# Patient Record
Sex: Male | Born: 1945 | Race: White | Hispanic: No | Marital: Married | Smoking: Current every day smoker
Health system: Southern US, Community
[De-identification: ages and names within clinical notes are randomized; demographics above are authoritative.]

## PROBLEM LIST (undated history)

## (undated) DIAGNOSIS — E291 Testicular hypofunction: Secondary | ICD-10-CM

## (undated) DIAGNOSIS — E785 Hyperlipidemia, unspecified: Secondary | ICD-10-CM

## (undated) DIAGNOSIS — Z86010 Personal history of colonic polyps: Secondary | ICD-10-CM

## (undated) DIAGNOSIS — R202 Paresthesia of skin: Secondary | ICD-10-CM

## (undated) DIAGNOSIS — K219 Gastro-esophageal reflux disease without esophagitis: Secondary | ICD-10-CM

## (undated) DIAGNOSIS — L219 Seborrheic dermatitis, unspecified: Secondary | ICD-10-CM

## (undated) DIAGNOSIS — M5416 Radiculopathy, lumbar region: Secondary | ICD-10-CM

## (undated) DIAGNOSIS — E119 Type 2 diabetes mellitus without complications: Secondary | ICD-10-CM

## (undated) DIAGNOSIS — E669 Obesity, unspecified: Secondary | ICD-10-CM

## (undated) DIAGNOSIS — L570 Actinic keratosis: Secondary | ICD-10-CM

## (undated) DIAGNOSIS — F329 Major depressive disorder, single episode, unspecified: Secondary | ICD-10-CM

## (undated) DIAGNOSIS — M545 Low back pain: Secondary | ICD-10-CM

## (undated) DIAGNOSIS — M1710 Unilateral primary osteoarthritis, unspecified knee: Secondary | ICD-10-CM

## (undated) DIAGNOSIS — G629 Polyneuropathy, unspecified: Secondary | ICD-10-CM

## (undated) DIAGNOSIS — M549 Dorsalgia, unspecified: Secondary | ICD-10-CM

## (undated) DIAGNOSIS — N402 Nodular prostate without lower urinary tract symptoms: Secondary | ICD-10-CM

## (undated) DIAGNOSIS — G253 Myoclonus: Secondary | ICD-10-CM

## (undated) DIAGNOSIS — I10 Essential (primary) hypertension: Secondary | ICD-10-CM

## (undated) HISTORY — DX: Type 2 diabetes mellitus without complications: E11.9

## (undated) HISTORY — DX: Low back pain: M54.5

## (undated) HISTORY — DX: Hyperlipidemia, unspecified: E78.5

## (undated) HISTORY — DX: Personal history of colonic polyps: Z86.010

## (undated) HISTORY — DX: Obesity, unspecified: E66.9

## (undated) HISTORY — DX: Testicular hypofunction: E29.1

## (undated) HISTORY — PX: HAND SURGERY: SHX662

## (undated) HISTORY — DX: Nodular prostate without lower urinary tract symptoms: N40.2

## (undated) HISTORY — DX: Seborrheic dermatitis, unspecified: L21.9

## (undated) HISTORY — PX: ORIF ANKLE FRACTURE: SHX5408

## (undated) HISTORY — DX: Paresthesia of skin: R20.2

## (undated) HISTORY — PX: REPLACEMENT TOTAL KNEE: SUR1224

## (undated) HISTORY — DX: Polyneuropathy, unspecified: G62.9

## (undated) HISTORY — DX: Radiculopathy, lumbar region: M54.16

## (undated) HISTORY — PX: SHOULDER SURGERY: SHX246

## (undated) HISTORY — DX: Actinic keratosis: L57.0

## (undated) HISTORY — DX: Gastro-esophageal reflux disease without esophagitis: K21.9

## (undated) HISTORY — DX: Major depressive disorder, single episode, unspecified: F32.9

## (undated) HISTORY — DX: Unilateral primary osteoarthritis, unspecified knee: M17.10

## (undated) HISTORY — DX: Myoclonus: G25.3

---

## 2007-08-15 HISTORY — PX: ORIF ANKLE FRACTURE: SHX5408

## 2012-07-12 HISTORY — PX: REPLACEMENT TOTAL KNEE: SUR1224

## 2013-06-23 HISTORY — PX: UMBILICAL HERNIA REPAIR: SHX196

## 2013-11-22 DIAGNOSIS — B351 Tinea unguium: Secondary | ICD-10-CM | POA: Diagnosis not present

## 2013-11-22 DIAGNOSIS — M79609 Pain in unspecified limb: Secondary | ICD-10-CM | POA: Diagnosis not present

## 2014-09-17 DIAGNOSIS — I83893 Varicose veins of bilateral lower extremities with other complications: Secondary | ICD-10-CM | POA: Diagnosis not present

## 2014-09-25 DIAGNOSIS — I83893 Varicose veins of bilateral lower extremities with other complications: Secondary | ICD-10-CM | POA: Diagnosis not present

## 2014-11-23 DIAGNOSIS — M79672 Pain in left foot: Secondary | ICD-10-CM | POA: Diagnosis not present

## 2014-11-23 DIAGNOSIS — M79671 Pain in right foot: Secondary | ICD-10-CM | POA: Diagnosis not present

## 2014-11-23 DIAGNOSIS — B351 Tinea unguium: Secondary | ICD-10-CM | POA: Diagnosis not present

## 2014-11-23 DIAGNOSIS — E119 Type 2 diabetes mellitus without complications: Secondary | ICD-10-CM | POA: Diagnosis not present

## 2014-12-19 ENCOUNTER — Emergency Department (HOSPITAL_BASED_OUTPATIENT_CLINIC_OR_DEPARTMENT_OTHER)
Admission: EM | Admit: 2014-12-19 | Discharge: 2014-12-19 | Disposition: A | Discharge status: Home / Self Care | Payer: Medicare Other | Attending: Emergency Medicine | Admitting: Emergency Medicine

## 2014-12-19 ENCOUNTER — Emergency Department (HOSPITAL_BASED_OUTPATIENT_CLINIC_OR_DEPARTMENT_OTHER): Payer: Medicare Other

## 2014-12-19 ENCOUNTER — Encounter (HOSPITAL_BASED_OUTPATIENT_CLINIC_OR_DEPARTMENT_OTHER): Payer: Self-pay | Admitting: *Deleted

## 2014-12-19 DIAGNOSIS — Z720 Tobacco use: Secondary | ICD-10-CM | POA: Diagnosis not present

## 2014-12-19 DIAGNOSIS — Z79899 Other long term (current) drug therapy: Secondary | ICD-10-CM | POA: Insufficient documentation

## 2014-12-19 DIAGNOSIS — Y9389 Activity, other specified: Secondary | ICD-10-CM | POA: Insufficient documentation

## 2014-12-19 DIAGNOSIS — Y9289 Other specified places as the place of occurrence of the external cause: Secondary | ICD-10-CM | POA: Diagnosis not present

## 2014-12-19 DIAGNOSIS — Y998 Other external cause status: Secondary | ICD-10-CM | POA: Diagnosis not present

## 2014-12-19 DIAGNOSIS — Z23 Encounter for immunization: Secondary | ICD-10-CM | POA: Insufficient documentation

## 2014-12-19 DIAGNOSIS — S31121A Laceration of abdominal wall with foreign body, left upper quadrant without penetration into peritoneal cavity, initial encounter: Secondary | ICD-10-CM | POA: Insufficient documentation

## 2014-12-19 DIAGNOSIS — I10 Essential (primary) hypertension: Secondary | ICD-10-CM | POA: Insufficient documentation

## 2014-12-19 DIAGNOSIS — S31119A Laceration without foreign body of abdominal wall, unspecified quadrant without penetration into peritoneal cavity, initial encounter: Secondary | ICD-10-CM

## 2014-12-19 DIAGNOSIS — S3991XA Unspecified injury of abdomen, initial encounter: Secondary | ICD-10-CM | POA: Diagnosis present

## 2014-12-19 DIAGNOSIS — Y288XXA Contact with other sharp object, undetermined intent, initial encounter: Secondary | ICD-10-CM | POA: Insufficient documentation

## 2014-12-19 DIAGNOSIS — T182XXA Foreign body in stomach, initial encounter: Secondary | ICD-10-CM | POA: Diagnosis not present

## 2014-12-19 DIAGNOSIS — M795 Residual foreign body in soft tissue: Secondary | ICD-10-CM

## 2014-12-19 DIAGNOSIS — S36893A Laceration of other intra-abdominal organs, initial encounter: Secondary | ICD-10-CM | POA: Diagnosis not present

## 2014-12-19 HISTORY — DX: Essential (primary) hypertension: I10

## 2014-12-19 LAB — CBC WITH DIFFERENTIAL/PLATELET
BASOS ABS: 0.1 10*3/uL (ref 0.0–0.1)
BASOS PCT: 1 % (ref 0–1)
EOS ABS: 0.2 10*3/uL (ref 0.0–0.7)
EOS PCT: 2 % (ref 0–5)
HCT: 40.8 % (ref 39.0–52.0)
Hemoglobin: 13.4 g/dL (ref 13.0–17.0)
Lymphocytes Relative: 17 % (ref 12–46)
Lymphs Abs: 1.6 10*3/uL (ref 0.7–4.0)
MCH: 29.3 pg (ref 26.0–34.0)
MCHC: 32.8 g/dL (ref 30.0–36.0)
MCV: 89.1 fL (ref 78.0–100.0)
Monocytes Absolute: 0.7 10*3/uL (ref 0.1–1.0)
Monocytes Relative: 7 % (ref 3–12)
NEUTROS PCT: 73 % (ref 43–77)
Neutro Abs: 7.1 10*3/uL (ref 1.7–7.7)
Platelets: 273 10*3/uL (ref 150–400)
RBC: 4.58 MIL/uL (ref 4.22–5.81)
RDW: 14.2 % (ref 11.5–15.5)
WBC: 9.8 10*3/uL (ref 4.0–10.5)

## 2014-12-19 LAB — BASIC METABOLIC PANEL
Anion gap: 8 (ref 5–15)
BUN: 16 mg/dL (ref 6–23)
CO2: 30 mmol/L (ref 19–32)
Calcium: 9 mg/dL (ref 8.4–10.5)
Chloride: 105 mmol/L (ref 96–112)
Creatinine, Ser: 1.07 mg/dL (ref 0.50–1.35)
GFR calc non Af Amer: 68 mL/min — ABNORMAL LOW (ref >90)
GFR, EST AFRICAN AMERICAN: 79 mL/min — AB (ref >90)
GLUCOSE: 159 mg/dL — AB (ref 70–99)
Potassium: 4 mmol/L (ref 3.5–5.1)
SODIUM: 143 mmol/L (ref 135–145)

## 2014-12-19 MED ORDER — HYDROMORPHONE HCL 4 MG PO TABS: 4.0000 mg | ORAL_TABLET | ORAL | Status: DC | PRN

## 2014-12-19 MED ORDER — IOHEXOL 300 MG/ML  SOLN
100.0000 mL | Freq: Once | INTRAMUSCULAR | Status: AC | PRN
  Administered 2014-12-19: 100 mL via INTRAVENOUS

## 2014-12-19 MED ORDER — AMOXICILLIN-POT CLAVULANATE 875-125 MG PO TABS
1.0000 | ORAL_TABLET | Freq: Two times a day (BID) | ORAL | Status: DC
Start: 2014-12-19 — End: 2017-05-14

## 2014-12-19 MED ORDER — LIDOCAINE-EPINEPHRINE 2 %-1:100000 IJ SOLN
INTRAMUSCULAR | Status: AC
  Administered 2014-12-19: 1 mL
  Filled 2014-12-19: qty 1

## 2014-12-19 MED ORDER — TETANUS-DIPHTH-ACELL PERTUSSIS 5-2.5-18.5 LF-MCG/0.5 IM SUSP
0.5000 mL | Freq: Once | INTRAMUSCULAR | Status: AC
  Administered 2014-12-19: 0.5 mL via INTRAMUSCULAR
  Filled 2014-12-19: qty 0.5

## 2014-12-19 MED ORDER — HYDROCODONE-ACETAMINOPHEN 5-325 MG PO TABS: 1.0000 | ORAL_TABLET | Freq: Four times a day (QID) | ORAL | Status: DC | PRN

## 2014-12-19 NOTE — Discharge Instructions (Signed)

## 2014-12-19 NOTE — ED Provider Notes (Signed)
Thomas [name] is a 69 y.o. male with presenting with laceration  LACERATION REPAIR Performed by: Arkin Imran Authorized by: [employer] Consent: Verbal consent obtained. Risks and benefits: risks, benefits and alternatives were discussed Consent given by: patient Patient identity confirmed: provided demographic data Prepped and Draped in normal sterile fashion Wound explored  Laceration Location: left abdomen proximal to umbilicus  Laceration Length: 5-6 cm  Foreign Bodies seen and removed.  Anesthesia: local infiltration  Local anesthetic: lidocaine 2% with epinephrine  Anesthetic total: 5 ml  Irrigation method: syringe Amount of cleaning: extensive  Skin closure: 3-0 Proline  Number of sutures: one  Technique: running suture  Patient tolerance: Patient tolerated the procedure well with no immediate complications.    Al Corpus, PA-C 12/19/14 2226

## 2014-12-19 NOTE — ED Notes (Signed)
Pt c/o laceration to abd from metal x 1 hr ago

## 2014-12-19 NOTE — ED Provider Notes (Addendum)
CSN: [id number]     Arrival date & time 12/19/14  1614 History  This chart was scribed for Thomas Patches, MD by Steva Colder, ED Scribe. The patient was seen in room MH04/MH04 at 6:06 PM.     Chief Complaint  Patient presents with  . Laceration      HPI Comments: Pt presents with laceration to upper abdomen. Pt was using a metal saw to cut a piece o metal which the disk used for cutting broke apart while he was using machine at a high RPM. A piece lodged in his abdomen which was several inches long and he removed it PTA. His shirt was cut. Bleeding controlled PTA. HE is upset there has been a wait and just wants it washed out and closed.   The history is provided by the patient. No language interpreter was used.    HPI Comments: Thomas [name] is a 69 y.o. male with a medical hx of HTN who presents to the Emergency Department complaining of laceration onset 4 PM PTA. Pt notes that a metal wheel broke while he was cutting metal tonight. Pt was cut on his left upper forearm and his abdomen. Pt pulled a piece of metal out of his abdomen. Pt notes that the wheel broke apart and it shot out of the machine and cut him. Pt was making a platform to go on the back of his camper when the incident occurred. He denies abdominal pain and any other symptoms. Pt does not remember when his last tetanus shot was and he has not had one in the last ten years. Pt had a hernia surgery on his abdomen in the past. Pt is not allergic to any medications.   PCP- [name], MD    Past Medical History  Diagnosis Date  . Hypertension    History reviewed. No pertinent past surgical history. History reviewed. No pertinent family history. History  Substance Use Topics  . Smoking status: Current Every Day Smoker -- 1.00 packs/day  . Smokeless tobacco: Not on file  . Alcohol Use: No    Review of Systems  Constitutional: Negative for fever, activity change, appetite change and fatigue.  HENT: Negative for  congestion, facial swelling, rhinorrhea and trouble swallowing.   Eyes: Negative for photophobia and pain.  Respiratory: Negative for cough, chest tightness and shortness of breath.   Cardiovascular: Negative for chest pain and leg swelling.  Gastrointestinal: Negative for nausea, vomiting, abdominal pain, diarrhea and constipation.  Endocrine: Negative for polydipsia and polyuria.  Genitourinary: Negative for dysuria, urgency, decreased urine volume and difficulty urinating.  Musculoskeletal: Negative for back pain and gait problem.  Skin: Positive for wound. Negative for color change and rash.  Allergic/Immunologic: Negative for immunocompromised state.  Neurological: Negative for dizziness, facial asymmetry, speech difficulty, weakness, numbness and headaches.  Psychiatric/Behavioral: Negative for confusion, decreased concentration and agitation.    A complete 10 system review of systems was obtained and all systems are negative except as noted in the HPI and PMH.    Allergies  Review of patient's allergies indicates no known allergies.  Home Medications   Prior to Admission medications   Medication Sig Start Date End Date Taking? Authorizing Provider  hydrochlorothiazide (MICROZIDE) 12.5 MG capsule Take 12.5 mg by mouth daily.   Yes Historical Provider, MD  methadone (DOLOPHINE) 10 MG tablet Take 10 mg by mouth every 8 (eight) hours.   Yes Historical Provider, MD   BP 148/66 mmHg  Pulse 84  Temp(Src) 99.8  F (37.7 C) (Oral)  Resp 18  Ht _0  (1.803 m)  Wt 245 lb (111.131 kg)  BMI 34.19 kg/m2  SpO2 95%  Physical Exam  Constitutional: He is oriented to person, place, and time. He appears well-developed and well-nourished. No distress.  HENT:  Head: Normocephalic and atraumatic.  Mouth/Throat: No oropharyngeal exudate.  Eyes: Pupils are equal, round, and reactive to light.  Neck: Normal range of motion. Neck supple.  Cardiovascular: Normal rate, regular rhythm and  normal heart sounds.  Exam reveals no gallop and no friction rub.   No murmur heard. Pulmonary/Chest: Effort normal and breath sounds normal. No respiratory distress. He has no wheezes. He has no rales.  Abdominal: Soft. Bowel sounds are normal. He exhibits no distension and no mass. There is no tenderness. There is no rebound and no guarding.    Musculoskeletal: Normal range of motion. He exhibits no edema or tenderness.  Neurological: He is alert and oriented to person, place, and time.  Skin: Skin is warm and dry. Laceration noted.  4 cm linear laceration which when probed does not appear to go deeper than subcutaneous. Pt otherwise has no abdominal pain.   Psychiatric: He has a normal mood and affect.  Nursing note and vitals reviewed.   ED Course  Procedures (including critical care time) DIAGNOSTIC STUDIES: Oxygen Saturation is 95% on RA, adequate by my interpretation.    COORDINATION OF CARE: 6:11 PM-Discussed treatment plan which includes abdomen acute with chest X-ray, laceration repair, tetanus injection, f/u with PCP with pt at bedside and pt agreed to plan.     Results for orders placed or performed during the hospital encounter of 12/19/14  CBC with Differential  Result Value Ref Range   WBC 9.8 4.0 - 10.5 K/uL   RBC 4.58 4.22 - 5.81 MIL/uL   Hemoglobin 13.4 13.0 - 17.0 g/dL   HCT 40.8 39.0 - 52.0 %   MCV 89.1 78.0 - 100.0 fL   MCH 29.3 26.0 - 34.0 pg   MCHC 32.8 30.0 - 36.0 g/dL   RDW 14.2 11.5 - 15.5 %   Platelets 273 150 - 400 K/uL   Neutrophils Relative % 73 43 - 77 %   Neutro Abs 7.1 1.7 - 7.7 K/uL   Lymphocytes Relative 17 12 - 46 %   Lymphs Abs 1.6 0.7 - 4.0 K/uL   Monocytes Relative 7 3 - 12 %   Monocytes Absolute 0.7 0.1 - 1.0 K/uL   Eosinophils Relative 2 0 - 5 %   Eosinophils Absolute 0.2 0.0 - 0.7 K/uL   Basophils Relative 1 0 - 1 %   Basophils Absolute 0.1 0.0 - 0.1 K/uL  Basic metabolic panel  Result Value Ref Range   Sodium 143 135 - 145  mmol/L   Potassium 4.0 3.5 - 5.1 mmol/L   Chloride 105 96 - 112 mmol/L   CO2 30 19 - 32 mmol/L   Glucose, Bld 159 (H) 70 - 99 mg/dL   BUN 16 6 - 23 mg/dL   Creatinine, Ser 1.07 0.50 - 1.35 mg/dL   Calcium 9.0 8.4 - 10.5 mg/dL   GFR calc non Af Amer 68 (L) >90 mL/min   GFR calc Af Amer 79 (L) >90 mL/min   Anion gap 8 5 - 15   Ct Abdomen Pelvis W Contrast  12/19/2014   CLINICAL DATA:  Laceration to upper abdomen.  EXAM: CT ABDOMEN AND PELVIS WITH CONTRAST  TECHNIQUE: Multidetector CT imaging of the abdomen  and pelvis was performed using the standard protocol following bolus administration of intravenous contrast.  CONTRAST:  14m OMNIPAQUE IOHEXOL 300 MG/ML  SOLN  COMPARISON:  12/19/2014  FINDINGS: Lower chest: Mild cardiac enlargement. There is a small pericardial effusion identified. No pleural effusion noted. The lung bases are clear.  Hepatobiliary: No suspicious liver abnormalities identified. The gallbladder appears normal. There is no biliary dilatation.  Pancreas: Negative  Spleen: Negative  Adrenals/Urinary Tract: The adrenal glands are negative. Normal appearance of both kidneys. The urinary bladder is unremarkable.  Stomach/Bowel: The stomach is within normal limits. The small bowel loops have a normal course and caliber. No obstruction. Normal appearance of the colon. The stomach scratch th the appendix is visualized and appears normal.  Vascular/Lymphatic: Calcified atherosclerotic disease involves the abdominal aorta. No aneurysm. No enlarged retroperitoneal or mesenteric adenopathy. No enlarged pelvic or inguinal lymph nodes.  Reproductive: Prostate gland and seminal vesicles are unremarkable.  Other: Skin laceration is identified overlying the left upper quadrant of the abdomen, image 22/series 2. Subcutaneous emphysema noted. There is a small metallic foreign body on the surface of the scanned overlying the left upper quadrant, image 22/series 2. Previous mesh repair of periumbilical  hernia.  Musculoskeletal: Review of the visualized osseous structures is significant for multi level degenerative disc disease within the lumbar spine.  IMPRESSION: 1. No acute findings within the abdomen or pelvis. 2. Pericardial effusion. 3. Skin laceration overlying the left upper quadrant of the abdomen. There is an adjacent small metallic foreign body overlying the skin. 4. 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   Electronically Signed   By: TKerby MoorsM.D.   On: 12/19/2014 21:11   Dg Abd Acute W/chest  12/19/2014   CLINICAL DATA:  Trauma to upper abdomen with piece of metal removed from left upper abdomen earlier today  EXAM: DG ABDOMEN ACUTE W/ 1V CHEST  COMPARISON:  None.  FINDINGS: PA chest: No edema or consolidation. Heart is slightly enlarged with pulmonary vascularity within normal limits. No adenopathy. No pneumothorax. No radiopaque foreign body.  Supine and upright abdomen: There is moderate stool in the colon. There is no bowel dilatation or air-fluid level. No obstruction or free air. There is a tiny metallic foreign body in the left mid abdomen. Note that there are multiple coils in the pelvis.  IMPRESSION: Tiny metallic foreign body lateral left mid abdomen region. Postoperative change in the pelvis. Bowel gas pattern unremarkable. No lung edema or consolidation. Heart mildly enlarged.   Electronically Signed   By: WLowella GripIII M.D.   On: 12/19/2014 18:50        EKG Interpretation None      MDM   Final diagnoses:  Foreign body (FB) in soft tissue    Pt is a 69y.o. male with Pmhx as above who presents with laceration to upper abdomen. Pt was using a metal saw to cut a piece o metal which the disk used for cutting broke apart while he was using machine at a high RPM. A piece lodged in his abdomen which was several inches long and he removed it PTA. His shirt was cut. Bleeding controlled PTA. Laceration is linear, does not appear deep, but will  get XR ot r/o FB. Pt upset with the wait, I have apologized multiple times, but feel it is in his best interest to have wound fully evaluated before closure.   XR with retained FB. CT ordered, which shows no deep injury. Wound irrigated and  repaired by PA Daniel Nones, wound was reportedly dirty, will place on prophylactic abx. Tdap updated.    [facility] Hay evaluation in the Emergency Department is complete. It has been determined that no acute conditions requiring further emergency intervention are present at this time. The patient/guardian have been advised of the diagnosis and plan. We have discussed signs and symptoms that warrant return to the ED, such as changes or worsening in symptoms, worsening pain, fever, redness. Drainage.     I personally performed the services described in this documentation, which was scribed in my presence. The recorded information has been reviewed and is accurate.     Thomas Patches, MD 12/25/14 [id number]  Thomas Patches, MD 12/25/14 2134

## 2014-12-19 NOTE — ED Notes (Signed)
IV removed cath intact.  Dressings applied to wound and lac

## 2014-12-21 ENCOUNTER — Encounter (HOSPITAL_BASED_OUTPATIENT_CLINIC_OR_DEPARTMENT_OTHER): Payer: Self-pay

## 2014-12-21 ENCOUNTER — Emergency Department (HOSPITAL_BASED_OUTPATIENT_CLINIC_OR_DEPARTMENT_OTHER)
Admission: EM | Admit: 2014-12-21 | Discharge: 2014-12-21 | Disposition: A | Discharge status: Home / Self Care | Payer: Medicare Other | Attending: Emergency Medicine | Admitting: Emergency Medicine

## 2014-12-21 DIAGNOSIS — Z4801 Encounter for change or removal of surgical wound dressing: Secondary | ICD-10-CM | POA: Insufficient documentation

## 2014-12-21 DIAGNOSIS — Z720 Tobacco use: Secondary | ICD-10-CM | POA: Insufficient documentation

## 2014-12-21 DIAGNOSIS — Z792 Long term (current) use of antibiotics: Secondary | ICD-10-CM | POA: Diagnosis not present

## 2014-12-21 DIAGNOSIS — Z5189 Encounter for other specified aftercare: Secondary | ICD-10-CM

## 2014-12-21 DIAGNOSIS — I10 Essential (primary) hypertension: Secondary | ICD-10-CM | POA: Diagnosis not present

## 2014-12-21 DIAGNOSIS — Z79899 Other long term (current) drug therapy: Secondary | ICD-10-CM | POA: Diagnosis not present

## 2014-12-21 NOTE — ED Notes (Signed)
Pt reports he is here for follow up on his stomach on his stomach.

## 2014-12-21 NOTE — Discharge Instructions (Signed)
Suture removal on Friday or Saturday April 23/24th. Wound Care Wound care helps prevent pain and infection.  You may need a tetanus shot if:  You cannot remember when you had your last tetanus shot.  You have never had a tetanus shot.  The injury broke your skin. If you need a tetanus shot and you choose not to have one, you may get tetanus. Sickness from tetanus can be serious. HOME CARE   Only take medicine as told by your doctor.  Clean the wound daily with mild soap and water.  Change any bandages (dressings) as told by your doctor.  Put medicated cream and a bandage on the wound as told by your doctor.  Change the bandage if it gets wet, dirty, or starts to smell.  Take showers. Do not take baths, swim, or do anything that puts your wound under water.  Rest and raise (elevate) the wound until the pain and puffiness (swelling) are better.  Keep all doctor visits as told. GET HELP RIGHT AWAY IF:   Yellowish-white fluid (pus) comes from the wound.  Medicine does not lessen your pain.  There is a red streak going away from the wound.  You have a fever. MAKE SURE YOU:   Understand these instructions.  Will watch your condition.  Will get help right away if you are not doing well or get worse. Document Released: 06/02/2008 Document Revised: 11/16/2011 Document Reviewed: 12/28/2010 [facility] Patient Information 2015 [place], [place]. This information is not intended to replace advice given to you by your health care provider. Make sure you discuss any questions you have with your health care provider.

## 2014-12-21 NOTE — ED Provider Notes (Signed)
CSN: [id number]     Arrival date & time 12/21/14  0630 History   First MD Initiated Contact with Patient 12/21/14 [phone]     Chief Complaint  Patient presents with  . Wound Check      HPI  Concern evaluation for wound check. Seen here 2 days ago. He was using a cutoff wheel six-inch angle grinder when the blade ruptured. Laceration on his abdomen. His CT scan showed no deep penetration. A few shrapnel fragments removed as repaired. He presents here for routine recheck. Minimal bleeding on the first day, none since. No pain. No additional drainage.  Past Medical History  Diagnosis Date  . Hypertension    History reviewed. No pertinent past surgical history. No family history on file. History  Substance Use Topics  . Smoking status: Current Every Day Smoker -- 1.00 packs/day  . Smokeless tobacco: Not on file  . Alcohol Use: No    Review of Systems  Skin:       Amount is to the wound. No pain no bleeding no drainage minimal surrounding erythema.      Allergies  Review of patient's allergies indicates no known allergies.  Home Medications   Prior to Admission medications   Medication Sig Start Date End Date Taking? Authorizing Provider  amoxicillin-clavulanate (AUGMENTIN) 875-125 MG per tablet Take 1 tablet by mouth 2 (two) times daily. One po bid x 7 days 12/19/14   Ernestina Patches, MD  hydrochlorothiazide (MICROZIDE) 12.5 MG capsule Take 12.5 mg by mouth daily.    Historical Provider, MD  HYDROcodone-acetaminophen (NORCO) 5-325 MG per tablet Take 1 tablet by mouth every 6 (six) hours as needed. 12/19/14   Ernestina Patches, MD  methadone (DOLOPHINE) 10 MG tablet Take 10 mg by mouth every 8 (eight) hours.    Historical Provider, MD   BP 162/91 mmHg  Pulse 88  Temp(Src) 98.9 F (37.2 C) (Oral)  Resp 20  SpO2 95% Physical Exam  Abdominal:      ED Course  Procedures (including critical care time) Labs Review Labs Reviewed - No data to display  Imaging Review Ct  Abdomen Pelvis W Contrast  12/19/2014   CLINICAL DATA:  Laceration to upper abdomen.  EXAM: CT ABDOMEN AND PELVIS WITH CONTRAST  TECHNIQUE: Multidetector CT imaging of the abdomen and pelvis was performed using the standard protocol following bolus administration of intravenous contrast.  CONTRAST:  132m OMNIPAQUE IOHEXOL 300 MG/ML  SOLN  COMPARISON:  12/19/2014  FINDINGS: Lower chest: Mild cardiac enlargement. There is a small pericardial effusion identified. No pleural effusion noted. The lung bases are clear.  Hepatobiliary: No suspicious liver abnormalities identified. The gallbladder appears normal. There is no biliary dilatation.  Pancreas: Negative  Spleen: Negative  Adrenals/Urinary Tract: The adrenal glands are negative. Normal appearance of both kidneys. The urinary bladder is unremarkable.  Stomach/Bowel: The stomach is within normal limits. The small bowel loops have a normal course and caliber. No obstruction. Normal appearance of the colon. The stomach scratch th the appendix is visualized and appears normal.  Vascular/Lymphatic: Calcified atherosclerotic disease involves the abdominal aorta. No aneurysm. No enlarged retroperitoneal or mesenteric adenopathy. No enlarged pelvic or inguinal lymph nodes.  Reproductive: Prostate gland and seminal vesicles are unremarkable.  Other: Skin laceration is identified overlying the left upper quadrant of the abdomen, image 22/series 2. Subcutaneous emphysema noted. There is a small metallic foreign body on the surface of the scanned overlying the left upper quadrant, image 22/series 2. Previous mesh repair of  periumbilical hernia.  Musculoskeletal: Review of the visualized osseous structures is significant for multi level degenerative disc disease within the lumbar spine.  IMPRESSION: 1. No acute findings within the abdomen or pelvis. 2. Pericardial effusion. 3. Skin laceration overlying the left upper quadrant of the abdomen. There is an adjacent small metallic  foreign body overlying the skin. 4. 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   Electronically Signed   By: Kerby Moors M.D.   On: 12/19/2014 21:11   Dg Abd Acute W/chest  12/19/2014   CLINICAL DATA:  Trauma to upper abdomen with piece of metal removed from left upper abdomen earlier today  EXAM: DG ABDOMEN ACUTE W/ 1V CHEST  COMPARISON:  None.  FINDINGS: PA chest: No edema or consolidation. Heart is slightly enlarged with pulmonary vascularity within normal limits. No adenopathy. No pneumothorax. No radiopaque foreign body.  Supine and upright abdomen: There is moderate stool in the colon. There is no bowel dilatation or air-fluid level. No obstruction or free air. There is a tiny metallic foreign body in the left mid abdomen. Note that there are multiple coils in the pelvis.  IMPRESSION: Tiny metallic foreign body lateral left mid abdomen region. Postoperative change in the pelvis. Bowel gas pattern unremarkable. No lung edema or consolidation. Heart mildly enlarged.   Electronically Signed   By: Lowella Grip III M.D.   On: 12/19/2014 18:50     EKG Interpretation None      MDM   Final diagnoses:  Visit for wound check    Continue basic wound care. Suture removal in an additional 7 day days.    Tanna Furry, MD 12/21/14 [phone]

## 2014-12-24 DIAGNOSIS — I83891 Varicose veins of right lower extremities with other complications: Secondary | ICD-10-CM | POA: Diagnosis not present

## 2014-12-24 DIAGNOSIS — I83811 Varicose veins of right lower extremities with pain: Secondary | ICD-10-CM | POA: Diagnosis not present

## 2014-12-30 ENCOUNTER — Emergency Department (HOSPITAL_BASED_OUTPATIENT_CLINIC_OR_DEPARTMENT_OTHER)
Admission: EM | Admit: 2014-12-30 | Discharge: 2014-12-30 | Disposition: A | Discharge status: Home / Self Care | Payer: Medicare Other | Attending: Emergency Medicine | Admitting: Emergency Medicine

## 2014-12-30 ENCOUNTER — Encounter (HOSPITAL_BASED_OUTPATIENT_CLINIC_OR_DEPARTMENT_OTHER): Payer: Self-pay | Admitting: *Deleted

## 2014-12-30 DIAGNOSIS — Z720 Tobacco use: Secondary | ICD-10-CM | POA: Insufficient documentation

## 2014-12-30 DIAGNOSIS — Z4802 Encounter for removal of sutures: Secondary | ICD-10-CM | POA: Insufficient documentation

## 2014-12-30 DIAGNOSIS — I10 Essential (primary) hypertension: Secondary | ICD-10-CM | POA: Insufficient documentation

## 2014-12-30 DIAGNOSIS — Z79899 Other long term (current) drug therapy: Secondary | ICD-10-CM | POA: Insufficient documentation

## 2014-12-30 NOTE — Discharge Instructions (Signed)
Suture Removal, Care After Refer to this sheet in the next few weeks. These instructions provide you with information on caring for yourself after your procedure. Your health care provider may also give you more specific instructions. Your treatment has been planned according to current medical practices, but problems sometimes occur. Call your health care provider if you have any problems or questions after your procedure. WHAT TO EXPECT AFTER THE PROCEDURE After your stitches (sutures) are removed, it is typical to have the following:  Some discomfort and swelling in the wound area.  Slight redness in the area. HOME CARE INSTRUCTIONS   If you have skin adhesive strips over the wound area, do not take the strips off. They will fall off on their own in a few days. If the strips remain in place after 14 days, you may remove them.  Change any bandages (dressings) at least once a day or as directed by your health care provider. If the bandage sticks, soak it off with warm, soapy water.  Apply cream or ointment only as directed by your health care provider. If using cream or ointment, wash the area with soap and water 2 times a day to remove all the cream or ointment. Rinse off the soap and pat the area dry with a clean towel.  Keep the wound area dry and clean. If the bandage becomes wet or dirty, or if it develops a bad smell, change it as soon as possible.  Continue to protect the wound from injury.  Use sunscreen when out in the sun. New scars become sunburned easily. SEEK MEDICAL CARE IF:  You have increasing redness, swelling, or pain in the wound.  You see pus coming from the wound.  You have a fever.  You notice a bad smell coming from the wound or dressing.  Your wound breaks open (edges not staying together). Document Released: 05/19/2001 Document Revised: 06/14/2013 Document Reviewed: 04/05/2013 [facility] Patient Information 2015 [place], [place]. This information is not  intended to replace advice given to you by your health care provider. Make sure you discuss any questions you have with your health care provider.

## 2014-12-30 NOTE — ED Notes (Signed)
Here for suture removal on abd, site is red in color, pt denies drainage or fever, states has been applying Neosporin to site daily

## 2014-12-30 NOTE — ED Notes (Signed)
Pt teaching done as regards to hand hygiene, S&S of infection. Pt stated back instructions.

## 2014-12-30 NOTE — ED Notes (Signed)
Here for suture removal , EDP in to see pt, assess site, sutures removal by EDP, bandaid applied per orders

## 2014-12-30 NOTE — ED Notes (Signed)
dsg applied, per orders

## 2014-12-30 NOTE — ED Provider Notes (Signed)
CSN: [id number]     Arrival date & time 12/30/14  2500 History   None    Chief Complaint  Patient presents with  . Suture / Staple Removal     (Consider location/radiation/quality/duration/timing/severity/associated sxs/prior Treatment) HPI Comments: Presents to the ER for removal of sutures. Patient denies any pain, drainage.  Patient is a 69 y.o. male presenting with suture removal.  Suture / Staple Removal    Past Medical History  Diagnosis Date  . Hypertension    History reviewed. No pertinent past surgical history. History reviewed. No pertinent family history. History  Substance Use Topics  . Smoking status: Current Every Day Smoker -- 1.00 packs/day  . Smokeless tobacco: Not on file  . Alcohol Use: No    Review of Systems  Skin: Negative.       Allergies  Review of patient's allergies indicates no known allergies.  Home Medications   Prior to Admission medications   Medication Sig Start Date End Date Taking? Authorizing Provider  amoxicillin-clavulanate (AUGMENTIN) 875-125 MG per tablet Take 1 tablet by mouth 2 (two) times daily. One po bid x 7 days 12/19/14   Ernestina Patches, MD  hydrochlorothiazide (MICROZIDE) 12.5 MG capsule Take 12.5 mg by mouth daily.    Historical Provider, MD  HYDROcodone-acetaminophen (NORCO) 5-325 MG per tablet Take 1 tablet by mouth every 6 (six) hours as needed. 12/19/14   Ernestina Patches, MD  methadone (DOLOPHINE) 10 MG tablet Take 10 mg by mouth every 8 (eight) hours.    Historical Provider, MD   BP 163/89 mmHg  Pulse 90  Temp(Src) 97.8 F (36.6 C) (Oral)  Resp 18  Wt 250 lb (113.399 kg)  SpO2 99% Physical Exam  Skin:  Running suture in place left side of mid abdomen. Erythema noted at wound margins, well demarcated edges, likely secondary to Neosporin use. No induration, warmth, fluctuance.    ED Course  Procedures (including critical care time) Labs Review Labs Reviewed - No data to display  Imaging Review No results  found.   EKG Interpretation None      MDM   Final diagnoses:  Encounter for removal of sutures    Well-demarcated erythema surrounding the entirety of the wound that appears to be the outline of bandage and/or Neosporin application. This does not appear to be infection. Patient had sutures removed by myself. One running suture removed in its entirety.    Orpah Greek, MD 12/30/14 [phone]

## 2015-05-22 DIAGNOSIS — M79671 Pain in right foot: Secondary | ICD-10-CM | POA: Diagnosis not present

## 2015-05-22 DIAGNOSIS — B351 Tinea unguium: Secondary | ICD-10-CM | POA: Diagnosis not present

## 2015-05-22 DIAGNOSIS — E119 Type 2 diabetes mellitus without complications: Secondary | ICD-10-CM | POA: Diagnosis not present

## 2015-05-22 DIAGNOSIS — M79672 Pain in left foot: Secondary | ICD-10-CM | POA: Diagnosis not present

## 2015-06-04 DIAGNOSIS — M9905 Segmental and somatic dysfunction of pelvic region: Secondary | ICD-10-CM | POA: Diagnosis not present

## 2015-06-04 DIAGNOSIS — M9904 Segmental and somatic dysfunction of sacral region: Secondary | ICD-10-CM | POA: Diagnosis not present

## 2015-06-04 DIAGNOSIS — G544 Lumbosacral root disorders, not elsewhere classified: Secondary | ICD-10-CM | POA: Diagnosis not present

## 2015-06-04 DIAGNOSIS — M9903 Segmental and somatic dysfunction of lumbar region: Secondary | ICD-10-CM | POA: Diagnosis not present

## 2015-06-05 DIAGNOSIS — M9904 Segmental and somatic dysfunction of sacral region: Secondary | ICD-10-CM | POA: Diagnosis not present

## 2015-06-05 DIAGNOSIS — M9905 Segmental and somatic dysfunction of pelvic region: Secondary | ICD-10-CM | POA: Diagnosis not present

## 2015-06-05 DIAGNOSIS — G544 Lumbosacral root disorders, not elsewhere classified: Secondary | ICD-10-CM | POA: Diagnosis not present

## 2015-06-05 DIAGNOSIS — M9903 Segmental and somatic dysfunction of lumbar region: Secondary | ICD-10-CM | POA: Diagnosis not present

## 2015-06-06 DIAGNOSIS — G544 Lumbosacral root disorders, not elsewhere classified: Secondary | ICD-10-CM | POA: Diagnosis not present

## 2015-06-06 DIAGNOSIS — M9904 Segmental and somatic dysfunction of sacral region: Secondary | ICD-10-CM | POA: Diagnosis not present

## 2015-06-06 DIAGNOSIS — M9905 Segmental and somatic dysfunction of pelvic region: Secondary | ICD-10-CM | POA: Diagnosis not present

## 2015-06-06 DIAGNOSIS — M9903 Segmental and somatic dysfunction of lumbar region: Secondary | ICD-10-CM | POA: Diagnosis not present

## 2015-06-07 DIAGNOSIS — G544 Lumbosacral root disorders, not elsewhere classified: Secondary | ICD-10-CM | POA: Diagnosis not present

## 2015-06-07 DIAGNOSIS — M9905 Segmental and somatic dysfunction of pelvic region: Secondary | ICD-10-CM | POA: Diagnosis not present

## 2015-06-07 DIAGNOSIS — M9904 Segmental and somatic dysfunction of sacral region: Secondary | ICD-10-CM | POA: Diagnosis not present

## 2015-06-07 DIAGNOSIS — M9903 Segmental and somatic dysfunction of lumbar region: Secondary | ICD-10-CM | POA: Diagnosis not present

## 2015-06-10 DIAGNOSIS — G544 Lumbosacral root disorders, not elsewhere classified: Secondary | ICD-10-CM | POA: Diagnosis not present

## 2015-06-10 DIAGNOSIS — M1712 Unilateral primary osteoarthritis, left knee: Secondary | ICD-10-CM | POA: Diagnosis not present

## 2015-06-10 DIAGNOSIS — M9903 Segmental and somatic dysfunction of lumbar region: Secondary | ICD-10-CM | POA: Diagnosis not present

## 2015-06-10 DIAGNOSIS — M9904 Segmental and somatic dysfunction of sacral region: Secondary | ICD-10-CM | POA: Diagnosis not present

## 2015-06-10 DIAGNOSIS — M25562 Pain in left knee: Secondary | ICD-10-CM | POA: Diagnosis not present

## 2015-06-10 DIAGNOSIS — R262 Difficulty in walking, not elsewhere classified: Secondary | ICD-10-CM | POA: Diagnosis not present

## 2015-06-10 DIAGNOSIS — M9905 Segmental and somatic dysfunction of pelvic region: Secondary | ICD-10-CM | POA: Diagnosis not present

## 2015-06-11 DIAGNOSIS — M9905 Segmental and somatic dysfunction of pelvic region: Secondary | ICD-10-CM | POA: Diagnosis not present

## 2015-06-11 DIAGNOSIS — M9903 Segmental and somatic dysfunction of lumbar region: Secondary | ICD-10-CM | POA: Diagnosis not present

## 2015-06-11 DIAGNOSIS — G544 Lumbosacral root disorders, not elsewhere classified: Secondary | ICD-10-CM | POA: Diagnosis not present

## 2015-06-11 DIAGNOSIS — M9904 Segmental and somatic dysfunction of sacral region: Secondary | ICD-10-CM | POA: Diagnosis not present

## 2015-06-12 DIAGNOSIS — M9902 Segmental and somatic dysfunction of thoracic region: Secondary | ICD-10-CM | POA: Diagnosis not present

## 2015-06-12 DIAGNOSIS — G542 Cervical root disorders, not elsewhere classified: Secondary | ICD-10-CM | POA: Diagnosis not present

## 2015-06-12 DIAGNOSIS — M9901 Segmental and somatic dysfunction of cervical region: Secondary | ICD-10-CM | POA: Diagnosis not present

## 2015-06-12 DIAGNOSIS — M9903 Segmental and somatic dysfunction of lumbar region: Secondary | ICD-10-CM | POA: Diagnosis not present

## 2015-06-13 DIAGNOSIS — M9904 Segmental and somatic dysfunction of sacral region: Secondary | ICD-10-CM | POA: Diagnosis not present

## 2015-06-13 DIAGNOSIS — G544 Lumbosacral root disorders, not elsewhere classified: Secondary | ICD-10-CM | POA: Diagnosis not present

## 2015-06-13 DIAGNOSIS — M9905 Segmental and somatic dysfunction of pelvic region: Secondary | ICD-10-CM | POA: Diagnosis not present

## 2015-06-13 DIAGNOSIS — M9903 Segmental and somatic dysfunction of lumbar region: Secondary | ICD-10-CM | POA: Diagnosis not present

## 2015-06-17 DIAGNOSIS — M9905 Segmental and somatic dysfunction of pelvic region: Secondary | ICD-10-CM | POA: Diagnosis not present

## 2015-06-17 DIAGNOSIS — M9904 Segmental and somatic dysfunction of sacral region: Secondary | ICD-10-CM | POA: Diagnosis not present

## 2015-06-17 DIAGNOSIS — G544 Lumbosacral root disorders, not elsewhere classified: Secondary | ICD-10-CM | POA: Diagnosis not present

## 2015-06-17 DIAGNOSIS — M9903 Segmental and somatic dysfunction of lumbar region: Secondary | ICD-10-CM | POA: Diagnosis not present

## 2015-06-19 DIAGNOSIS — M9904 Segmental and somatic dysfunction of sacral region: Secondary | ICD-10-CM | POA: Diagnosis not present

## 2015-06-19 DIAGNOSIS — G544 Lumbosacral root disorders, not elsewhere classified: Secondary | ICD-10-CM | POA: Diagnosis not present

## 2015-06-19 DIAGNOSIS — M9905 Segmental and somatic dysfunction of pelvic region: Secondary | ICD-10-CM | POA: Diagnosis not present

## 2015-06-19 DIAGNOSIS — M9903 Segmental and somatic dysfunction of lumbar region: Secondary | ICD-10-CM | POA: Diagnosis not present

## 2015-06-24 DIAGNOSIS — M9904 Segmental and somatic dysfunction of sacral region: Secondary | ICD-10-CM | POA: Diagnosis not present

## 2015-06-24 DIAGNOSIS — M9905 Segmental and somatic dysfunction of pelvic region: Secondary | ICD-10-CM | POA: Diagnosis not present

## 2015-06-24 DIAGNOSIS — M9903 Segmental and somatic dysfunction of lumbar region: Secondary | ICD-10-CM | POA: Diagnosis not present

## 2015-06-24 DIAGNOSIS — G544 Lumbosacral root disorders, not elsewhere classified: Secondary | ICD-10-CM | POA: Diagnosis not present

## 2015-06-25 DIAGNOSIS — M25561 Pain in right knee: Secondary | ICD-10-CM | POA: Diagnosis not present

## 2015-06-25 DIAGNOSIS — M25562 Pain in left knee: Secondary | ICD-10-CM | POA: Diagnosis not present

## 2015-06-25 DIAGNOSIS — G544 Lumbosacral root disorders, not elsewhere classified: Secondary | ICD-10-CM | POA: Diagnosis not present

## 2015-06-25 DIAGNOSIS — M9904 Segmental and somatic dysfunction of sacral region: Secondary | ICD-10-CM | POA: Diagnosis not present

## 2015-06-25 DIAGNOSIS — R269 Unspecified abnormalities of gait and mobility: Secondary | ICD-10-CM | POA: Diagnosis not present

## 2015-06-25 DIAGNOSIS — M170 Bilateral primary osteoarthritis of knee: Secondary | ICD-10-CM | POA: Diagnosis not present

## 2015-06-25 DIAGNOSIS — M9905 Segmental and somatic dysfunction of pelvic region: Secondary | ICD-10-CM | POA: Diagnosis not present

## 2015-06-25 DIAGNOSIS — M1712 Unilateral primary osteoarthritis, left knee: Secondary | ICD-10-CM | POA: Diagnosis not present

## 2015-06-25 DIAGNOSIS — M9903 Segmental and somatic dysfunction of lumbar region: Secondary | ICD-10-CM | POA: Diagnosis not present

## 2015-06-26 DIAGNOSIS — M9904 Segmental and somatic dysfunction of sacral region: Secondary | ICD-10-CM | POA: Diagnosis not present

## 2015-06-26 DIAGNOSIS — G544 Lumbosacral root disorders, not elsewhere classified: Secondary | ICD-10-CM | POA: Diagnosis not present

## 2015-06-26 DIAGNOSIS — M9905 Segmental and somatic dysfunction of pelvic region: Secondary | ICD-10-CM | POA: Diagnosis not present

## 2015-06-26 DIAGNOSIS — M9903 Segmental and somatic dysfunction of lumbar region: Secondary | ICD-10-CM | POA: Diagnosis not present

## 2015-06-27 DIAGNOSIS — M9905 Segmental and somatic dysfunction of pelvic region: Secondary | ICD-10-CM | POA: Diagnosis not present

## 2015-06-27 DIAGNOSIS — M9904 Segmental and somatic dysfunction of sacral region: Secondary | ICD-10-CM | POA: Diagnosis not present

## 2015-06-27 DIAGNOSIS — G544 Lumbosacral root disorders, not elsewhere classified: Secondary | ICD-10-CM | POA: Diagnosis not present

## 2015-06-27 DIAGNOSIS — M9903 Segmental and somatic dysfunction of lumbar region: Secondary | ICD-10-CM | POA: Diagnosis not present

## 2015-06-28 DIAGNOSIS — M9903 Segmental and somatic dysfunction of lumbar region: Secondary | ICD-10-CM | POA: Diagnosis not present

## 2015-06-28 DIAGNOSIS — M9902 Segmental and somatic dysfunction of thoracic region: Secondary | ICD-10-CM | POA: Diagnosis not present

## 2015-06-28 DIAGNOSIS — G542 Cervical root disorders, not elsewhere classified: Secondary | ICD-10-CM | POA: Diagnosis not present

## 2015-06-28 DIAGNOSIS — M9901 Segmental and somatic dysfunction of cervical region: Secondary | ICD-10-CM | POA: Diagnosis not present

## 2015-06-29 ENCOUNTER — Emergency Department (HOSPITAL_BASED_OUTPATIENT_CLINIC_OR_DEPARTMENT_OTHER): Payer: Medicare Other

## 2015-06-29 ENCOUNTER — Encounter (HOSPITAL_BASED_OUTPATIENT_CLINIC_OR_DEPARTMENT_OTHER): Payer: Self-pay | Admitting: *Deleted

## 2015-06-29 ENCOUNTER — Emergency Department (HOSPITAL_BASED_OUTPATIENT_CLINIC_OR_DEPARTMENT_OTHER)
Admission: EM | Admit: 2015-06-29 | Discharge: 2015-06-29 | Disposition: A | Discharge status: Home / Self Care | Payer: Medicare Other | Attending: Emergency Medicine | Admitting: Emergency Medicine

## 2015-06-29 DIAGNOSIS — S4991XA Unspecified injury of right shoulder and upper arm, initial encounter: Secondary | ICD-10-CM | POA: Diagnosis not present

## 2015-06-29 DIAGNOSIS — M25511 Pain in right shoulder: Secondary | ICD-10-CM | POA: Diagnosis not present

## 2015-06-29 DIAGNOSIS — W010XXA Fall on same level from slipping, tripping and stumbling without subsequent striking against object, initial encounter: Secondary | ICD-10-CM | POA: Insufficient documentation

## 2015-06-29 DIAGNOSIS — Y998 Other external cause status: Secondary | ICD-10-CM | POA: Diagnosis not present

## 2015-06-29 DIAGNOSIS — S8991XA Unspecified injury of right lower leg, initial encounter: Secondary | ICD-10-CM | POA: Insufficient documentation

## 2015-06-29 DIAGNOSIS — Z720 Tobacco use: Secondary | ICD-10-CM | POA: Insufficient documentation

## 2015-06-29 DIAGNOSIS — I10 Essential (primary) hypertension: Secondary | ICD-10-CM | POA: Insufficient documentation

## 2015-06-29 DIAGNOSIS — Z79899 Other long term (current) drug therapy: Secondary | ICD-10-CM | POA: Insufficient documentation

## 2015-06-29 DIAGNOSIS — S40911A Unspecified superficial injury of right shoulder, initial encounter: Secondary | ICD-10-CM | POA: Diagnosis not present

## 2015-06-29 DIAGNOSIS — Y9289 Other specified places as the place of occurrence of the external cause: Secondary | ICD-10-CM | POA: Insufficient documentation

## 2015-06-29 DIAGNOSIS — Y9389 Activity, other specified: Secondary | ICD-10-CM | POA: Insufficient documentation

## 2015-06-29 DIAGNOSIS — S299XXA Unspecified injury of thorax, initial encounter: Secondary | ICD-10-CM | POA: Diagnosis not present

## 2015-06-29 DIAGNOSIS — M25561 Pain in right knee: Secondary | ICD-10-CM | POA: Diagnosis not present

## 2015-06-29 HISTORY — DX: Dorsalgia, unspecified: M54.9

## 2015-06-29 MED ORDER — IBUPROFEN 200 MG PO TABS: 400.0000 mg | ORAL_TABLET | Freq: Three times a day (TID) | ORAL | Status: AC

## 2015-06-29 NOTE — ED Notes (Signed)
Patient states he tripped and fell and injured right shoulder Thursday, no LOC.  He waited and thought it would feel better, but it continues to hurt and ROM is limited

## 2015-06-29 NOTE — ED Provider Notes (Signed)
CSN: [id number]     Arrival date & time 06/29/15  1478 History   First MD Initiated Contact with Patient 06/29/15 [phone]     Chief Complaint  Patient presents with  . Fall     (Consider location/radiation/quality/duration/timing/severity/associated sxs/prior Treatment) Patient is a 69 y.o. male presenting with arm injury.  Arm Injury Location:  Shoulder Time since incident:  2 days Injury: yes   Mechanism of injury: fall   Shoulder location:  R shoulder Pain details:    Quality:  Aching   Radiates to:  L shoulder   Severity:  Moderate   Onset quality:  Sudden   Duration:  2 days   Timing:  Constant   Progression:  Unchanged Relieved by:  Nothing Worsened by:  Movement Ineffective treatments: 33m oxycodone. Associated symptoms: no numbness, no stiffness, no swelling and no tingling     Past Medical History  Diagnosis Date  . Hypertension   . Back pain    History reviewed. No pertinent past surgical history. No family history on file. Social History  Substance Use Topics  . Smoking status: Current Every Day Smoker -- 1.00 packs/day  . Smokeless tobacco: None  . Alcohol Use: No    Review of Systems  Musculoskeletal: Negative for stiffness.  All other systems reviewed and are negative.     Allergies  Review of patient's allergies indicates no known allergies.  Home Medications   Prior to Admission medications   Medication Sig Start Date End Date Taking? Authorizing Provider  oxyCODONE-acetaminophen (PERCOCET/ROXICET) 5-325 MG tablet Take 1 tablet by mouth every 4 (four) hours as needed for severe pain.   Yes Historical Provider, MD  amoxicillin-clavulanate (AUGMENTIN) 875-125 MG per tablet Take 1 tablet by mouth 2 (two) times daily. One po bid x 7 days 12/19/14   MErnestina Patches MD  hydrochlorothiazide (MICROZIDE) 12.5 MG capsule Take 12.5 mg by mouth daily.    Historical Provider, MD  methadone (DOLOPHINE) 10 MG tablet Take 5 mg by mouth every 8 (eight) hours.      Historical Provider, MD   BP 150/79 mmHg  Pulse 108  Temp(Src) 98.1 F (36.7 C) (Oral)  Resp 20  Ht _0  (1.803 m)  Wt 240 lb (108.863 kg)  BMI 33.49 kg/m2  SpO2 98% Physical Exam  Constitutional: He is oriented to person, place, and time. He appears well-developed and well-nourished. No distress.  HENT:  Head: Normocephalic and atraumatic.  Eyes: Conjunctivae are normal. No scleral icterus.  Neck: Neck supple.  Cardiovascular: Normal rate and intact distal pulses.   Pulmonary/Chest: Effort normal. No stridor. No respiratory distress. He has no wheezes.      Abdominal: Normal appearance. He exhibits no distension.  Musculoskeletal:       Right shoulder: He exhibits tenderness. He exhibits no bony tenderness.       Right knee: Tenderness found.  Neurological: He is alert and oriented to person, place, and time.  Skin: Skin is warm and dry. No rash noted.  Psychiatric: He has a normal mood and affect. His behavior is normal.  Nursing note and vitals reviewed.   ED Course  Procedures (including critical care time) Labs Review Labs Reviewed - No data to display  Imaging Review Dg Chest 2 View  06/29/2015  CLINICAL DATA:  Fall EXAM: CHEST  2 VIEW COMPARISON:  12/19/2014 FINDINGS: Normal heart size. Subsegmental atelectasis at the left base. Right lung clear. No pneumothorax. No pleural effusion. IMPRESSION: Left basilar atelectasis. Electronically Signed  By: Marybelle Killings M.D.   On: 06/29/2015 11:06   Dg Shoulder Right  06/29/2015  CLINICAL DATA:  Right shoulder pain.  Fall 2 days ago. EXAM: RIGHT SHOULDER - 2+ VIEW COMPARISON:  None. FINDINGS: No acute fracture. No dislocation. Mild degenerative change of the [facility] joint. IMPRESSION: No acute bony pathology. Electronically Signed   By: Marybelle Killings M.D.   On: 06/29/2015 11:05   Dg Knee Complete 4 Views Right  06/29/2015  CLINICAL DATA:  Fall EXAM: RIGHT KNEE - COMPLETE 4+ VIEW COMPARISON:  None. FINDINGS: Total knee  arthroplasty in place. Anatomic alignment of the osseous and prostatic structures. No breakage or loosening of the hardware. No fracture. IMPRESSION: No acute bony pathology.  Postoperative change. Electronically Signed   By: Marybelle Killings M.D.   On: 06/29/2015 11:08   I have personally reviewed and evaluated these images and lab results as part of my medical decision-making.   EKG Interpretation None      MDM   Final diagnoses:  Right shoulder injury, initial encounter  Right knee pain    69 yo male who fell a few days ago with persistent right shoulder pain since.  He did not hit his head, lose consciousness.  Plain films were negative.  Remained well appearing.  Ambulatory.  dc'd home.    Serita Grit, MD 06/30/15 [phone]

## 2015-06-29 NOTE — ED Notes (Signed)
Patient states he fell Thursday and injured left arm.

## 2015-07-01 DIAGNOSIS — H04123 Dry eye syndrome of bilateral lacrimal glands: Secondary | ICD-10-CM | POA: Diagnosis not present

## 2015-07-01 DIAGNOSIS — G544 Lumbosacral root disorders, not elsewhere classified: Secondary | ICD-10-CM | POA: Diagnosis not present

## 2015-07-01 DIAGNOSIS — M9904 Segmental and somatic dysfunction of sacral region: Secondary | ICD-10-CM | POA: Diagnosis not present

## 2015-07-01 DIAGNOSIS — H02834 Dermatochalasis of left upper eyelid: Secondary | ICD-10-CM | POA: Diagnosis not present

## 2015-07-01 DIAGNOSIS — H02831 Dermatochalasis of right upper eyelid: Secondary | ICD-10-CM | POA: Diagnosis not present

## 2015-07-01 DIAGNOSIS — M9903 Segmental and somatic dysfunction of lumbar region: Secondary | ICD-10-CM | POA: Diagnosis not present

## 2015-07-01 DIAGNOSIS — H43813 Vitreous degeneration, bilateral: Secondary | ICD-10-CM | POA: Diagnosis not present

## 2015-07-01 DIAGNOSIS — M9905 Segmental and somatic dysfunction of pelvic region: Secondary | ICD-10-CM | POA: Diagnosis not present

## 2015-07-02 DIAGNOSIS — R269 Unspecified abnormalities of gait and mobility: Secondary | ICD-10-CM | POA: Diagnosis not present

## 2015-07-02 DIAGNOSIS — G542 Cervical root disorders, not elsewhere classified: Secondary | ICD-10-CM | POA: Diagnosis not present

## 2015-07-02 DIAGNOSIS — M9903 Segmental and somatic dysfunction of lumbar region: Secondary | ICD-10-CM | POA: Diagnosis not present

## 2015-07-02 DIAGNOSIS — M9901 Segmental and somatic dysfunction of cervical region: Secondary | ICD-10-CM | POA: Diagnosis not present

## 2015-07-02 DIAGNOSIS — M1712 Unilateral primary osteoarthritis, left knee: Secondary | ICD-10-CM | POA: Diagnosis not present

## 2015-07-02 DIAGNOSIS — M25562 Pain in left knee: Secondary | ICD-10-CM | POA: Diagnosis not present

## 2015-07-02 DIAGNOSIS — M9902 Segmental and somatic dysfunction of thoracic region: Secondary | ICD-10-CM | POA: Diagnosis not present

## 2015-07-03 DIAGNOSIS — M9903 Segmental and somatic dysfunction of lumbar region: Secondary | ICD-10-CM | POA: Diagnosis not present

## 2015-07-03 DIAGNOSIS — G544 Lumbosacral root disorders, not elsewhere classified: Secondary | ICD-10-CM | POA: Diagnosis not present

## 2015-07-03 DIAGNOSIS — M9905 Segmental and somatic dysfunction of pelvic region: Secondary | ICD-10-CM | POA: Diagnosis not present

## 2015-07-03 DIAGNOSIS — M9904 Segmental and somatic dysfunction of sacral region: Secondary | ICD-10-CM | POA: Diagnosis not present

## 2015-07-04 DIAGNOSIS — G544 Lumbosacral root disorders, not elsewhere classified: Secondary | ICD-10-CM | POA: Diagnosis not present

## 2015-07-04 DIAGNOSIS — M9905 Segmental and somatic dysfunction of pelvic region: Secondary | ICD-10-CM | POA: Diagnosis not present

## 2015-07-04 DIAGNOSIS — M9904 Segmental and somatic dysfunction of sacral region: Secondary | ICD-10-CM | POA: Diagnosis not present

## 2015-07-04 DIAGNOSIS — M9903 Segmental and somatic dysfunction of lumbar region: Secondary | ICD-10-CM | POA: Diagnosis not present

## 2015-07-08 DIAGNOSIS — M9901 Segmental and somatic dysfunction of cervical region: Secondary | ICD-10-CM | POA: Diagnosis not present

## 2015-07-08 DIAGNOSIS — G542 Cervical root disorders, not elsewhere classified: Secondary | ICD-10-CM | POA: Diagnosis not present

## 2015-07-08 DIAGNOSIS — M9903 Segmental and somatic dysfunction of lumbar region: Secondary | ICD-10-CM | POA: Diagnosis not present

## 2015-07-08 DIAGNOSIS — M9902 Segmental and somatic dysfunction of thoracic region: Secondary | ICD-10-CM | POA: Diagnosis not present

## 2015-07-09 DIAGNOSIS — M9904 Segmental and somatic dysfunction of sacral region: Secondary | ICD-10-CM | POA: Diagnosis not present

## 2015-07-09 DIAGNOSIS — M25562 Pain in left knee: Secondary | ICD-10-CM | POA: Diagnosis not present

## 2015-07-09 DIAGNOSIS — G544 Lumbosacral root disorders, not elsewhere classified: Secondary | ICD-10-CM | POA: Diagnosis not present

## 2015-07-09 DIAGNOSIS — M9905 Segmental and somatic dysfunction of pelvic region: Secondary | ICD-10-CM | POA: Diagnosis not present

## 2015-07-09 DIAGNOSIS — M9903 Segmental and somatic dysfunction of lumbar region: Secondary | ICD-10-CM | POA: Diagnosis not present

## 2015-07-09 DIAGNOSIS — M1712 Unilateral primary osteoarthritis, left knee: Secondary | ICD-10-CM | POA: Diagnosis not present

## 2015-07-10 DIAGNOSIS — M9903 Segmental and somatic dysfunction of lumbar region: Secondary | ICD-10-CM | POA: Diagnosis not present

## 2015-07-10 DIAGNOSIS — G544 Lumbosacral root disorders, not elsewhere classified: Secondary | ICD-10-CM | POA: Diagnosis not present

## 2015-07-10 DIAGNOSIS — M9904 Segmental and somatic dysfunction of sacral region: Secondary | ICD-10-CM | POA: Diagnosis not present

## 2015-07-10 DIAGNOSIS — M9905 Segmental and somatic dysfunction of pelvic region: Secondary | ICD-10-CM | POA: Diagnosis not present

## 2015-07-15 DIAGNOSIS — M9903 Segmental and somatic dysfunction of lumbar region: Secondary | ICD-10-CM | POA: Diagnosis not present

## 2015-07-15 DIAGNOSIS — G544 Lumbosacral root disorders, not elsewhere classified: Secondary | ICD-10-CM | POA: Diagnosis not present

## 2015-07-15 DIAGNOSIS — M9905 Segmental and somatic dysfunction of pelvic region: Secondary | ICD-10-CM | POA: Diagnosis not present

## 2015-07-15 DIAGNOSIS — M9904 Segmental and somatic dysfunction of sacral region: Secondary | ICD-10-CM | POA: Diagnosis not present

## 2015-07-16 DIAGNOSIS — M9903 Segmental and somatic dysfunction of lumbar region: Secondary | ICD-10-CM | POA: Diagnosis not present

## 2015-07-16 DIAGNOSIS — M25561 Pain in right knee: Secondary | ICD-10-CM | POA: Diagnosis not present

## 2015-07-16 DIAGNOSIS — M1712 Unilateral primary osteoarthritis, left knee: Secondary | ICD-10-CM | POA: Diagnosis not present

## 2015-07-16 DIAGNOSIS — M9904 Segmental and somatic dysfunction of sacral region: Secondary | ICD-10-CM | POA: Diagnosis not present

## 2015-07-16 DIAGNOSIS — M170 Bilateral primary osteoarthritis of knee: Secondary | ICD-10-CM | POA: Diagnosis not present

## 2015-07-16 DIAGNOSIS — R2689 Other abnormalities of gait and mobility: Secondary | ICD-10-CM | POA: Diagnosis not present

## 2015-07-16 DIAGNOSIS — M25562 Pain in left knee: Secondary | ICD-10-CM | POA: Diagnosis not present

## 2015-07-16 DIAGNOSIS — G544 Lumbosacral root disorders, not elsewhere classified: Secondary | ICD-10-CM | POA: Diagnosis not present

## 2015-07-16 DIAGNOSIS — M9905 Segmental and somatic dysfunction of pelvic region: Secondary | ICD-10-CM | POA: Diagnosis not present

## 2015-07-17 DIAGNOSIS — M25562 Pain in left knee: Secondary | ICD-10-CM | POA: Diagnosis not present

## 2015-07-17 DIAGNOSIS — M9904 Segmental and somatic dysfunction of sacral region: Secondary | ICD-10-CM | POA: Diagnosis not present

## 2015-07-17 DIAGNOSIS — G544 Lumbosacral root disorders, not elsewhere classified: Secondary | ICD-10-CM | POA: Diagnosis not present

## 2015-07-17 DIAGNOSIS — M9905 Segmental and somatic dysfunction of pelvic region: Secondary | ICD-10-CM | POA: Diagnosis not present

## 2015-07-17 DIAGNOSIS — M9903 Segmental and somatic dysfunction of lumbar region: Secondary | ICD-10-CM | POA: Diagnosis not present

## 2015-07-18 DIAGNOSIS — M9905 Segmental and somatic dysfunction of pelvic region: Secondary | ICD-10-CM | POA: Diagnosis not present

## 2015-07-18 DIAGNOSIS — M9904 Segmental and somatic dysfunction of sacral region: Secondary | ICD-10-CM | POA: Diagnosis not present

## 2015-07-18 DIAGNOSIS — M9903 Segmental and somatic dysfunction of lumbar region: Secondary | ICD-10-CM | POA: Diagnosis not present

## 2015-07-18 DIAGNOSIS — G544 Lumbosacral root disorders, not elsewhere classified: Secondary | ICD-10-CM | POA: Diagnosis not present

## 2015-07-22 DIAGNOSIS — G544 Lumbosacral root disorders, not elsewhere classified: Secondary | ICD-10-CM | POA: Diagnosis not present

## 2015-07-22 DIAGNOSIS — M9904 Segmental and somatic dysfunction of sacral region: Secondary | ICD-10-CM | POA: Diagnosis not present

## 2015-07-22 DIAGNOSIS — M9903 Segmental and somatic dysfunction of lumbar region: Secondary | ICD-10-CM | POA: Diagnosis not present

## 2015-07-22 DIAGNOSIS — M9905 Segmental and somatic dysfunction of pelvic region: Secondary | ICD-10-CM | POA: Diagnosis not present

## 2015-07-23 DIAGNOSIS — M9905 Segmental and somatic dysfunction of pelvic region: Secondary | ICD-10-CM | POA: Diagnosis not present

## 2015-07-23 DIAGNOSIS — G544 Lumbosacral root disorders, not elsewhere classified: Secondary | ICD-10-CM | POA: Diagnosis not present

## 2015-07-23 DIAGNOSIS — M9904 Segmental and somatic dysfunction of sacral region: Secondary | ICD-10-CM | POA: Diagnosis not present

## 2015-07-23 DIAGNOSIS — M9903 Segmental and somatic dysfunction of lumbar region: Secondary | ICD-10-CM | POA: Diagnosis not present

## 2015-07-24 DIAGNOSIS — M9905 Segmental and somatic dysfunction of pelvic region: Secondary | ICD-10-CM | POA: Diagnosis not present

## 2015-07-24 DIAGNOSIS — G544 Lumbosacral root disorders, not elsewhere classified: Secondary | ICD-10-CM | POA: Diagnosis not present

## 2015-07-24 DIAGNOSIS — M9903 Segmental and somatic dysfunction of lumbar region: Secondary | ICD-10-CM | POA: Diagnosis not present

## 2015-07-24 DIAGNOSIS — M9904 Segmental and somatic dysfunction of sacral region: Secondary | ICD-10-CM | POA: Diagnosis not present

## 2015-07-25 DIAGNOSIS — M9903 Segmental and somatic dysfunction of lumbar region: Secondary | ICD-10-CM | POA: Diagnosis not present

## 2015-07-25 DIAGNOSIS — G544 Lumbosacral root disorders, not elsewhere classified: Secondary | ICD-10-CM | POA: Diagnosis not present

## 2015-07-25 DIAGNOSIS — M9904 Segmental and somatic dysfunction of sacral region: Secondary | ICD-10-CM | POA: Diagnosis not present

## 2015-07-25 DIAGNOSIS — M9905 Segmental and somatic dysfunction of pelvic region: Secondary | ICD-10-CM | POA: Diagnosis not present

## 2015-07-29 DIAGNOSIS — G544 Lumbosacral root disorders, not elsewhere classified: Secondary | ICD-10-CM | POA: Diagnosis not present

## 2015-07-29 DIAGNOSIS — M9903 Segmental and somatic dysfunction of lumbar region: Secondary | ICD-10-CM | POA: Diagnosis not present

## 2015-07-29 DIAGNOSIS — M9905 Segmental and somatic dysfunction of pelvic region: Secondary | ICD-10-CM | POA: Diagnosis not present

## 2015-07-29 DIAGNOSIS — M9904 Segmental and somatic dysfunction of sacral region: Secondary | ICD-10-CM | POA: Diagnosis not present

## 2015-07-30 DIAGNOSIS — M9903 Segmental and somatic dysfunction of lumbar region: Secondary | ICD-10-CM | POA: Diagnosis not present

## 2015-07-30 DIAGNOSIS — M9904 Segmental and somatic dysfunction of sacral region: Secondary | ICD-10-CM | POA: Diagnosis not present

## 2015-07-30 DIAGNOSIS — M9905 Segmental and somatic dysfunction of pelvic region: Secondary | ICD-10-CM | POA: Diagnosis not present

## 2015-07-30 DIAGNOSIS — G544 Lumbosacral root disorders, not elsewhere classified: Secondary | ICD-10-CM | POA: Diagnosis not present

## 2015-07-31 DIAGNOSIS — G542 Cervical root disorders, not elsewhere classified: Secondary | ICD-10-CM | POA: Diagnosis not present

## 2015-07-31 DIAGNOSIS — M9903 Segmental and somatic dysfunction of lumbar region: Secondary | ICD-10-CM | POA: Diagnosis not present

## 2015-07-31 DIAGNOSIS — M9901 Segmental and somatic dysfunction of cervical region: Secondary | ICD-10-CM | POA: Diagnosis not present

## 2015-07-31 DIAGNOSIS — M9902 Segmental and somatic dysfunction of thoracic region: Secondary | ICD-10-CM | POA: Diagnosis not present

## 2015-08-05 DIAGNOSIS — M9904 Segmental and somatic dysfunction of sacral region: Secondary | ICD-10-CM | POA: Diagnosis not present

## 2015-08-05 DIAGNOSIS — M9905 Segmental and somatic dysfunction of pelvic region: Secondary | ICD-10-CM | POA: Diagnosis not present

## 2015-08-05 DIAGNOSIS — G544 Lumbosacral root disorders, not elsewhere classified: Secondary | ICD-10-CM | POA: Diagnosis not present

## 2015-08-05 DIAGNOSIS — M9903 Segmental and somatic dysfunction of lumbar region: Secondary | ICD-10-CM | POA: Diagnosis not present

## 2015-08-06 DIAGNOSIS — M9904 Segmental and somatic dysfunction of sacral region: Secondary | ICD-10-CM | POA: Diagnosis not present

## 2015-08-06 DIAGNOSIS — M9905 Segmental and somatic dysfunction of pelvic region: Secondary | ICD-10-CM | POA: Diagnosis not present

## 2015-08-06 DIAGNOSIS — M9903 Segmental and somatic dysfunction of lumbar region: Secondary | ICD-10-CM | POA: Diagnosis not present

## 2015-08-06 DIAGNOSIS — G544 Lumbosacral root disorders, not elsewhere classified: Secondary | ICD-10-CM | POA: Diagnosis not present

## 2015-08-07 DIAGNOSIS — M9903 Segmental and somatic dysfunction of lumbar region: Secondary | ICD-10-CM | POA: Diagnosis not present

## 2015-08-07 DIAGNOSIS — M9902 Segmental and somatic dysfunction of thoracic region: Secondary | ICD-10-CM | POA: Diagnosis not present

## 2015-08-07 DIAGNOSIS — M9901 Segmental and somatic dysfunction of cervical region: Secondary | ICD-10-CM | POA: Diagnosis not present

## 2015-08-07 DIAGNOSIS — G542 Cervical root disorders, not elsewhere classified: Secondary | ICD-10-CM | POA: Diagnosis not present

## 2015-08-09 DIAGNOSIS — M9903 Segmental and somatic dysfunction of lumbar region: Secondary | ICD-10-CM | POA: Diagnosis not present

## 2015-08-09 DIAGNOSIS — M9902 Segmental and somatic dysfunction of thoracic region: Secondary | ICD-10-CM | POA: Diagnosis not present

## 2015-08-09 DIAGNOSIS — M9901 Segmental and somatic dysfunction of cervical region: Secondary | ICD-10-CM | POA: Diagnosis not present

## 2015-08-09 DIAGNOSIS — G542 Cervical root disorders, not elsewhere classified: Secondary | ICD-10-CM | POA: Diagnosis not present

## 2015-08-12 DIAGNOSIS — M9903 Segmental and somatic dysfunction of lumbar region: Secondary | ICD-10-CM | POA: Diagnosis not present

## 2015-08-12 DIAGNOSIS — G542 Cervical root disorders, not elsewhere classified: Secondary | ICD-10-CM | POA: Diagnosis not present

## 2015-08-12 DIAGNOSIS — M9901 Segmental and somatic dysfunction of cervical region: Secondary | ICD-10-CM | POA: Diagnosis not present

## 2015-08-12 DIAGNOSIS — M9902 Segmental and somatic dysfunction of thoracic region: Secondary | ICD-10-CM | POA: Diagnosis not present

## 2015-08-14 DIAGNOSIS — G542 Cervical root disorders, not elsewhere classified: Secondary | ICD-10-CM | POA: Diagnosis not present

## 2015-08-14 DIAGNOSIS — M9903 Segmental and somatic dysfunction of lumbar region: Secondary | ICD-10-CM | POA: Diagnosis not present

## 2015-08-14 DIAGNOSIS — M9902 Segmental and somatic dysfunction of thoracic region: Secondary | ICD-10-CM | POA: Diagnosis not present

## 2015-08-14 DIAGNOSIS — M9901 Segmental and somatic dysfunction of cervical region: Secondary | ICD-10-CM | POA: Diagnosis not present

## 2015-08-15 DIAGNOSIS — M9903 Segmental and somatic dysfunction of lumbar region: Secondary | ICD-10-CM | POA: Diagnosis not present

## 2015-08-15 DIAGNOSIS — M9901 Segmental and somatic dysfunction of cervical region: Secondary | ICD-10-CM | POA: Diagnosis not present

## 2015-08-15 DIAGNOSIS — G542 Cervical root disorders, not elsewhere classified: Secondary | ICD-10-CM | POA: Diagnosis not present

## 2015-08-15 DIAGNOSIS — M9902 Segmental and somatic dysfunction of thoracic region: Secondary | ICD-10-CM | POA: Diagnosis not present

## 2015-08-19 DIAGNOSIS — M75111 Incomplete rotator cuff tear or rupture of right shoulder, not specified as traumatic: Secondary | ICD-10-CM | POA: Diagnosis not present

## 2015-08-21 DIAGNOSIS — M79671 Pain in right foot: Secondary | ICD-10-CM | POA: Diagnosis not present

## 2015-08-21 DIAGNOSIS — M9903 Segmental and somatic dysfunction of lumbar region: Secondary | ICD-10-CM | POA: Diagnosis not present

## 2015-08-21 DIAGNOSIS — G542 Cervical root disorders, not elsewhere classified: Secondary | ICD-10-CM | POA: Diagnosis not present

## 2015-08-21 DIAGNOSIS — M79672 Pain in left foot: Secondary | ICD-10-CM | POA: Diagnosis not present

## 2015-08-21 DIAGNOSIS — M9901 Segmental and somatic dysfunction of cervical region: Secondary | ICD-10-CM | POA: Diagnosis not present

## 2015-08-21 DIAGNOSIS — M9902 Segmental and somatic dysfunction of thoracic region: Secondary | ICD-10-CM | POA: Diagnosis not present

## 2015-08-21 DIAGNOSIS — B351 Tinea unguium: Secondary | ICD-10-CM | POA: Diagnosis not present

## 2015-08-21 DIAGNOSIS — E119 Type 2 diabetes mellitus without complications: Secondary | ICD-10-CM | POA: Diagnosis not present

## 2015-08-22 DIAGNOSIS — M9905 Segmental and somatic dysfunction of pelvic region: Secondary | ICD-10-CM | POA: Diagnosis not present

## 2015-08-22 DIAGNOSIS — M9903 Segmental and somatic dysfunction of lumbar region: Secondary | ICD-10-CM | POA: Diagnosis not present

## 2015-08-22 DIAGNOSIS — G544 Lumbosacral root disorders, not elsewhere classified: Secondary | ICD-10-CM | POA: Diagnosis not present

## 2015-08-22 DIAGNOSIS — M9904 Segmental and somatic dysfunction of sacral region: Secondary | ICD-10-CM | POA: Diagnosis not present

## 2015-08-26 DIAGNOSIS — M9902 Segmental and somatic dysfunction of thoracic region: Secondary | ICD-10-CM | POA: Diagnosis not present

## 2015-08-26 DIAGNOSIS — M9901 Segmental and somatic dysfunction of cervical region: Secondary | ICD-10-CM | POA: Diagnosis not present

## 2015-08-26 DIAGNOSIS — M9903 Segmental and somatic dysfunction of lumbar region: Secondary | ICD-10-CM | POA: Diagnosis not present

## 2015-08-26 DIAGNOSIS — G542 Cervical root disorders, not elsewhere classified: Secondary | ICD-10-CM | POA: Diagnosis not present

## 2015-08-27 DIAGNOSIS — M9903 Segmental and somatic dysfunction of lumbar region: Secondary | ICD-10-CM | POA: Diagnosis not present

## 2015-08-27 DIAGNOSIS — M9904 Segmental and somatic dysfunction of sacral region: Secondary | ICD-10-CM | POA: Diagnosis not present

## 2015-08-27 DIAGNOSIS — G544 Lumbosacral root disorders, not elsewhere classified: Secondary | ICD-10-CM | POA: Diagnosis not present

## 2015-08-27 DIAGNOSIS — M9905 Segmental and somatic dysfunction of pelvic region: Secondary | ICD-10-CM | POA: Diagnosis not present

## 2015-09-03 DIAGNOSIS — M9904 Segmental and somatic dysfunction of sacral region: Secondary | ICD-10-CM | POA: Diagnosis not present

## 2015-09-03 DIAGNOSIS — M9903 Segmental and somatic dysfunction of lumbar region: Secondary | ICD-10-CM | POA: Diagnosis not present

## 2015-09-03 DIAGNOSIS — G544 Lumbosacral root disorders, not elsewhere classified: Secondary | ICD-10-CM | POA: Diagnosis not present

## 2015-09-03 DIAGNOSIS — M9905 Segmental and somatic dysfunction of pelvic region: Secondary | ICD-10-CM | POA: Diagnosis not present

## 2015-09-04 DIAGNOSIS — G544 Lumbosacral root disorders, not elsewhere classified: Secondary | ICD-10-CM | POA: Diagnosis not present

## 2015-09-04 DIAGNOSIS — M9904 Segmental and somatic dysfunction of sacral region: Secondary | ICD-10-CM | POA: Diagnosis not present

## 2015-09-04 DIAGNOSIS — M9903 Segmental and somatic dysfunction of lumbar region: Secondary | ICD-10-CM | POA: Diagnosis not present

## 2015-09-04 DIAGNOSIS — M9905 Segmental and somatic dysfunction of pelvic region: Secondary | ICD-10-CM | POA: Diagnosis not present

## 2015-09-05 DIAGNOSIS — M9903 Segmental and somatic dysfunction of lumbar region: Secondary | ICD-10-CM | POA: Diagnosis not present

## 2015-09-05 DIAGNOSIS — M9905 Segmental and somatic dysfunction of pelvic region: Secondary | ICD-10-CM | POA: Diagnosis not present

## 2015-09-05 DIAGNOSIS — G544 Lumbosacral root disorders, not elsewhere classified: Secondary | ICD-10-CM | POA: Diagnosis not present

## 2015-09-05 DIAGNOSIS — M9904 Segmental and somatic dysfunction of sacral region: Secondary | ICD-10-CM | POA: Diagnosis not present

## 2015-09-11 DIAGNOSIS — M9902 Segmental and somatic dysfunction of thoracic region: Secondary | ICD-10-CM | POA: Diagnosis not present

## 2015-09-11 DIAGNOSIS — G542 Cervical root disorders, not elsewhere classified: Secondary | ICD-10-CM | POA: Diagnosis not present

## 2015-09-11 DIAGNOSIS — M9903 Segmental and somatic dysfunction of lumbar region: Secondary | ICD-10-CM | POA: Diagnosis not present

## 2015-09-11 DIAGNOSIS — M9901 Segmental and somatic dysfunction of cervical region: Secondary | ICD-10-CM | POA: Diagnosis not present

## 2015-09-12 DIAGNOSIS — M9901 Segmental and somatic dysfunction of cervical region: Secondary | ICD-10-CM | POA: Diagnosis not present

## 2015-09-12 DIAGNOSIS — M9902 Segmental and somatic dysfunction of thoracic region: Secondary | ICD-10-CM | POA: Diagnosis not present

## 2015-09-12 DIAGNOSIS — G542 Cervical root disorders, not elsewhere classified: Secondary | ICD-10-CM | POA: Diagnosis not present

## 2015-09-12 DIAGNOSIS — M9903 Segmental and somatic dysfunction of lumbar region: Secondary | ICD-10-CM | POA: Diagnosis not present

## 2015-09-13 DIAGNOSIS — G542 Cervical root disorders, not elsewhere classified: Secondary | ICD-10-CM | POA: Diagnosis not present

## 2015-09-13 DIAGNOSIS — M9901 Segmental and somatic dysfunction of cervical region: Secondary | ICD-10-CM | POA: Diagnosis not present

## 2015-09-13 DIAGNOSIS — M9902 Segmental and somatic dysfunction of thoracic region: Secondary | ICD-10-CM | POA: Diagnosis not present

## 2015-09-13 DIAGNOSIS — M9903 Segmental and somatic dysfunction of lumbar region: Secondary | ICD-10-CM | POA: Diagnosis not present

## 2015-09-17 DIAGNOSIS — M75111 Incomplete rotator cuff tear or rupture of right shoulder, not specified as traumatic: Secondary | ICD-10-CM | POA: Diagnosis not present

## 2015-09-18 DIAGNOSIS — M9901 Segmental and somatic dysfunction of cervical region: Secondary | ICD-10-CM | POA: Diagnosis not present

## 2015-09-18 DIAGNOSIS — G542 Cervical root disorders, not elsewhere classified: Secondary | ICD-10-CM | POA: Diagnosis not present

## 2015-09-18 DIAGNOSIS — M9903 Segmental and somatic dysfunction of lumbar region: Secondary | ICD-10-CM | POA: Diagnosis not present

## 2015-09-18 DIAGNOSIS — M9902 Segmental and somatic dysfunction of thoracic region: Secondary | ICD-10-CM | POA: Diagnosis not present

## 2015-09-19 DIAGNOSIS — G542 Cervical root disorders, not elsewhere classified: Secondary | ICD-10-CM | POA: Diagnosis not present

## 2015-09-19 DIAGNOSIS — M9902 Segmental and somatic dysfunction of thoracic region: Secondary | ICD-10-CM | POA: Diagnosis not present

## 2015-09-19 DIAGNOSIS — M9901 Segmental and somatic dysfunction of cervical region: Secondary | ICD-10-CM | POA: Diagnosis not present

## 2015-09-19 DIAGNOSIS — M9903 Segmental and somatic dysfunction of lumbar region: Secondary | ICD-10-CM | POA: Diagnosis not present

## 2015-09-20 DIAGNOSIS — G544 Lumbosacral root disorders, not elsewhere classified: Secondary | ICD-10-CM | POA: Diagnosis not present

## 2015-09-20 DIAGNOSIS — M9904 Segmental and somatic dysfunction of sacral region: Secondary | ICD-10-CM | POA: Diagnosis not present

## 2015-09-20 DIAGNOSIS — M9905 Segmental and somatic dysfunction of pelvic region: Secondary | ICD-10-CM | POA: Diagnosis not present

## 2015-09-20 DIAGNOSIS — M9903 Segmental and somatic dysfunction of lumbar region: Secondary | ICD-10-CM | POA: Diagnosis not present

## 2015-09-24 DIAGNOSIS — M9905 Segmental and somatic dysfunction of pelvic region: Secondary | ICD-10-CM | POA: Diagnosis not present

## 2015-09-24 DIAGNOSIS — M9904 Segmental and somatic dysfunction of sacral region: Secondary | ICD-10-CM | POA: Diagnosis not present

## 2015-09-24 DIAGNOSIS — M9903 Segmental and somatic dysfunction of lumbar region: Secondary | ICD-10-CM | POA: Diagnosis not present

## 2015-09-24 DIAGNOSIS — G544 Lumbosacral root disorders, not elsewhere classified: Secondary | ICD-10-CM | POA: Diagnosis not present

## 2015-09-25 DIAGNOSIS — M9903 Segmental and somatic dysfunction of lumbar region: Secondary | ICD-10-CM | POA: Diagnosis not present

## 2015-09-25 DIAGNOSIS — G544 Lumbosacral root disorders, not elsewhere classified: Secondary | ICD-10-CM | POA: Diagnosis not present

## 2015-09-25 DIAGNOSIS — M9905 Segmental and somatic dysfunction of pelvic region: Secondary | ICD-10-CM | POA: Diagnosis not present

## 2015-09-25 DIAGNOSIS — M9904 Segmental and somatic dysfunction of sacral region: Secondary | ICD-10-CM | POA: Diagnosis not present

## 2015-09-26 DIAGNOSIS — M75121 Complete rotator cuff tear or rupture of right shoulder, not specified as traumatic: Secondary | ICD-10-CM | POA: Diagnosis not present

## 2015-09-26 DIAGNOSIS — M7521 Bicipital tendinitis, right shoulder: Secondary | ICD-10-CM | POA: Diagnosis not present

## 2015-09-26 DIAGNOSIS — G8918 Other acute postprocedural pain: Secondary | ICD-10-CM | POA: Diagnosis not present

## 2015-09-26 DIAGNOSIS — S46011A Strain of muscle(s) and tendon(s) of the rotator cuff of right shoulder, initial encounter: Secondary | ICD-10-CM | POA: Diagnosis not present

## 2015-09-26 DIAGNOSIS — M24111 Other articular cartilage disorders, right shoulder: Secondary | ICD-10-CM | POA: Diagnosis not present

## 2015-09-26 DIAGNOSIS — M7541 Impingement syndrome of right shoulder: Secondary | ICD-10-CM | POA: Diagnosis not present

## 2015-10-10 DIAGNOSIS — M75111 Incomplete rotator cuff tear or rupture of right shoulder, not specified as traumatic: Secondary | ICD-10-CM | POA: Diagnosis not present

## 2015-11-13 DIAGNOSIS — Z96651 Presence of right artificial knee joint: Secondary | ICD-10-CM | POA: Diagnosis not present

## 2015-11-14 DIAGNOSIS — M25511 Pain in right shoulder: Secondary | ICD-10-CM | POA: Diagnosis not present

## 2015-11-14 DIAGNOSIS — M75111 Incomplete rotator cuff tear or rupture of right shoulder, not specified as traumatic: Secondary | ICD-10-CM | POA: Diagnosis not present

## 2015-11-14 DIAGNOSIS — M25611 Stiffness of right shoulder, not elsewhere classified: Secondary | ICD-10-CM | POA: Diagnosis not present

## 2015-11-15 DIAGNOSIS — M75111 Incomplete rotator cuff tear or rupture of right shoulder, not specified as traumatic: Secondary | ICD-10-CM | POA: Diagnosis not present

## 2015-11-15 DIAGNOSIS — M25611 Stiffness of right shoulder, not elsewhere classified: Secondary | ICD-10-CM | POA: Diagnosis not present

## 2015-11-15 DIAGNOSIS — M25511 Pain in right shoulder: Secondary | ICD-10-CM | POA: Diagnosis not present

## 2015-11-19 DIAGNOSIS — M25611 Stiffness of right shoulder, not elsewhere classified: Secondary | ICD-10-CM | POA: Diagnosis not present

## 2015-11-19 DIAGNOSIS — M75111 Incomplete rotator cuff tear or rupture of right shoulder, not specified as traumatic: Secondary | ICD-10-CM | POA: Diagnosis not present

## 2015-11-19 DIAGNOSIS — M25511 Pain in right shoulder: Secondary | ICD-10-CM | POA: Diagnosis not present

## 2015-11-20 DIAGNOSIS — E119 Type 2 diabetes mellitus without complications: Secondary | ICD-10-CM | POA: Diagnosis not present

## 2015-11-20 DIAGNOSIS — M79672 Pain in left foot: Secondary | ICD-10-CM | POA: Diagnosis not present

## 2015-11-20 DIAGNOSIS — M79671 Pain in right foot: Secondary | ICD-10-CM | POA: Diagnosis not present

## 2015-11-20 DIAGNOSIS — B351 Tinea unguium: Secondary | ICD-10-CM | POA: Diagnosis not present

## 2015-11-22 DIAGNOSIS — M25511 Pain in right shoulder: Secondary | ICD-10-CM | POA: Diagnosis not present

## 2015-11-22 DIAGNOSIS — M75111 Incomplete rotator cuff tear or rupture of right shoulder, not specified as traumatic: Secondary | ICD-10-CM | POA: Diagnosis not present

## 2015-11-22 DIAGNOSIS — M25611 Stiffness of right shoulder, not elsewhere classified: Secondary | ICD-10-CM | POA: Diagnosis not present

## 2015-11-25 DIAGNOSIS — M75111 Incomplete rotator cuff tear or rupture of right shoulder, not specified as traumatic: Secondary | ICD-10-CM | POA: Diagnosis not present

## 2015-11-25 DIAGNOSIS — M25611 Stiffness of right shoulder, not elsewhere classified: Secondary | ICD-10-CM | POA: Diagnosis not present

## 2015-11-25 DIAGNOSIS — M25511 Pain in right shoulder: Secondary | ICD-10-CM | POA: Diagnosis not present

## 2015-11-26 DIAGNOSIS — M25511 Pain in right shoulder: Secondary | ICD-10-CM | POA: Diagnosis not present

## 2015-11-26 DIAGNOSIS — M75111 Incomplete rotator cuff tear or rupture of right shoulder, not specified as traumatic: Secondary | ICD-10-CM | POA: Diagnosis not present

## 2015-11-26 DIAGNOSIS — M25611 Stiffness of right shoulder, not elsewhere classified: Secondary | ICD-10-CM | POA: Diagnosis not present

## 2015-11-27 DIAGNOSIS — M25511 Pain in right shoulder: Secondary | ICD-10-CM | POA: Diagnosis not present

## 2015-11-27 DIAGNOSIS — M25611 Stiffness of right shoulder, not elsewhere classified: Secondary | ICD-10-CM | POA: Diagnosis not present

## 2015-11-27 DIAGNOSIS — M75111 Incomplete rotator cuff tear or rupture of right shoulder, not specified as traumatic: Secondary | ICD-10-CM | POA: Diagnosis not present

## 2015-12-02 DIAGNOSIS — M25611 Stiffness of right shoulder, not elsewhere classified: Secondary | ICD-10-CM | POA: Diagnosis not present

## 2015-12-02 DIAGNOSIS — M25511 Pain in right shoulder: Secondary | ICD-10-CM | POA: Diagnosis not present

## 2015-12-02 DIAGNOSIS — M75111 Incomplete rotator cuff tear or rupture of right shoulder, not specified as traumatic: Secondary | ICD-10-CM | POA: Diagnosis not present

## 2015-12-04 DIAGNOSIS — M25511 Pain in right shoulder: Secondary | ICD-10-CM | POA: Diagnosis not present

## 2015-12-04 DIAGNOSIS — M25611 Stiffness of right shoulder, not elsewhere classified: Secondary | ICD-10-CM | POA: Diagnosis not present

## 2015-12-04 DIAGNOSIS — M75111 Incomplete rotator cuff tear or rupture of right shoulder, not specified as traumatic: Secondary | ICD-10-CM | POA: Diagnosis not present

## 2015-12-06 DIAGNOSIS — M25611 Stiffness of right shoulder, not elsewhere classified: Secondary | ICD-10-CM | POA: Diagnosis not present

## 2015-12-06 DIAGNOSIS — M25511 Pain in right shoulder: Secondary | ICD-10-CM | POA: Diagnosis not present

## 2015-12-06 DIAGNOSIS — M75111 Incomplete rotator cuff tear or rupture of right shoulder, not specified as traumatic: Secondary | ICD-10-CM | POA: Diagnosis not present

## 2015-12-10 DIAGNOSIS — M25511 Pain in right shoulder: Secondary | ICD-10-CM | POA: Diagnosis not present

## 2015-12-10 DIAGNOSIS — M25611 Stiffness of right shoulder, not elsewhere classified: Secondary | ICD-10-CM | POA: Diagnosis not present

## 2015-12-10 DIAGNOSIS — M75111 Incomplete rotator cuff tear or rupture of right shoulder, not specified as traumatic: Secondary | ICD-10-CM | POA: Diagnosis not present

## 2015-12-11 DIAGNOSIS — Z96651 Presence of right artificial knee joint: Secondary | ICD-10-CM | POA: Diagnosis not present

## 2015-12-12 DIAGNOSIS — M25611 Stiffness of right shoulder, not elsewhere classified: Secondary | ICD-10-CM | POA: Diagnosis not present

## 2015-12-12 DIAGNOSIS — M25511 Pain in right shoulder: Secondary | ICD-10-CM | POA: Diagnosis not present

## 2015-12-12 DIAGNOSIS — M75111 Incomplete rotator cuff tear or rupture of right shoulder, not specified as traumatic: Secondary | ICD-10-CM | POA: Diagnosis not present

## 2015-12-13 DIAGNOSIS — M25511 Pain in right shoulder: Secondary | ICD-10-CM | POA: Diagnosis not present

## 2015-12-13 DIAGNOSIS — M25611 Stiffness of right shoulder, not elsewhere classified: Secondary | ICD-10-CM | POA: Diagnosis not present

## 2015-12-13 DIAGNOSIS — M75111 Incomplete rotator cuff tear or rupture of right shoulder, not specified as traumatic: Secondary | ICD-10-CM | POA: Diagnosis not present

## 2015-12-16 DIAGNOSIS — M25511 Pain in right shoulder: Secondary | ICD-10-CM | POA: Diagnosis not present

## 2015-12-16 DIAGNOSIS — M75111 Incomplete rotator cuff tear or rupture of right shoulder, not specified as traumatic: Secondary | ICD-10-CM | POA: Diagnosis not present

## 2015-12-16 DIAGNOSIS — M25611 Stiffness of right shoulder, not elsewhere classified: Secondary | ICD-10-CM | POA: Diagnosis not present

## 2015-12-16 DIAGNOSIS — M25562 Pain in left knee: Secondary | ICD-10-CM | POA: Diagnosis not present

## 2015-12-16 DIAGNOSIS — M1712 Unilateral primary osteoarthritis, left knee: Secondary | ICD-10-CM | POA: Diagnosis not present

## 2015-12-18 DIAGNOSIS — M25511 Pain in right shoulder: Secondary | ICD-10-CM | POA: Diagnosis not present

## 2015-12-18 DIAGNOSIS — M75111 Incomplete rotator cuff tear or rupture of right shoulder, not specified as traumatic: Secondary | ICD-10-CM | POA: Diagnosis not present

## 2015-12-18 DIAGNOSIS — M25611 Stiffness of right shoulder, not elsewhere classified: Secondary | ICD-10-CM | POA: Diagnosis not present

## 2015-12-23 DIAGNOSIS — M25611 Stiffness of right shoulder, not elsewhere classified: Secondary | ICD-10-CM | POA: Diagnosis not present

## 2015-12-23 DIAGNOSIS — M25562 Pain in left knee: Secondary | ICD-10-CM | POA: Diagnosis not present

## 2015-12-23 DIAGNOSIS — M1712 Unilateral primary osteoarthritis, left knee: Secondary | ICD-10-CM | POA: Diagnosis not present

## 2015-12-23 DIAGNOSIS — M75111 Incomplete rotator cuff tear or rupture of right shoulder, not specified as traumatic: Secondary | ICD-10-CM | POA: Diagnosis not present

## 2015-12-23 DIAGNOSIS — M25511 Pain in right shoulder: Secondary | ICD-10-CM | POA: Diagnosis not present

## 2015-12-25 DIAGNOSIS — M25511 Pain in right shoulder: Secondary | ICD-10-CM | POA: Diagnosis not present

## 2015-12-25 DIAGNOSIS — M25611 Stiffness of right shoulder, not elsewhere classified: Secondary | ICD-10-CM | POA: Diagnosis not present

## 2015-12-25 DIAGNOSIS — M75111 Incomplete rotator cuff tear or rupture of right shoulder, not specified as traumatic: Secondary | ICD-10-CM | POA: Diagnosis not present

## 2015-12-27 DIAGNOSIS — M25611 Stiffness of right shoulder, not elsewhere classified: Secondary | ICD-10-CM | POA: Diagnosis not present

## 2015-12-27 DIAGNOSIS — M25511 Pain in right shoulder: Secondary | ICD-10-CM | POA: Diagnosis not present

## 2015-12-27 DIAGNOSIS — M75111 Incomplete rotator cuff tear or rupture of right shoulder, not specified as traumatic: Secondary | ICD-10-CM | POA: Diagnosis not present

## 2015-12-30 DIAGNOSIS — M25511 Pain in right shoulder: Secondary | ICD-10-CM | POA: Diagnosis not present

## 2015-12-30 DIAGNOSIS — M75111 Incomplete rotator cuff tear or rupture of right shoulder, not specified as traumatic: Secondary | ICD-10-CM | POA: Diagnosis not present

## 2015-12-30 DIAGNOSIS — M25611 Stiffness of right shoulder, not elsewhere classified: Secondary | ICD-10-CM | POA: Diagnosis not present

## 2015-12-30 DIAGNOSIS — M25562 Pain in left knee: Secondary | ICD-10-CM | POA: Diagnosis not present

## 2015-12-30 DIAGNOSIS — M1712 Unilateral primary osteoarthritis, left knee: Secondary | ICD-10-CM | POA: Diagnosis not present

## 2016-01-01 DIAGNOSIS — M25611 Stiffness of right shoulder, not elsewhere classified: Secondary | ICD-10-CM | POA: Diagnosis not present

## 2016-01-01 DIAGNOSIS — M75111 Incomplete rotator cuff tear or rupture of right shoulder, not specified as traumatic: Secondary | ICD-10-CM | POA: Diagnosis not present

## 2016-01-01 DIAGNOSIS — M25511 Pain in right shoulder: Secondary | ICD-10-CM | POA: Diagnosis not present

## 2016-01-03 DIAGNOSIS — M25511 Pain in right shoulder: Secondary | ICD-10-CM | POA: Diagnosis not present

## 2016-01-03 DIAGNOSIS — M75111 Incomplete rotator cuff tear or rupture of right shoulder, not specified as traumatic: Secondary | ICD-10-CM | POA: Diagnosis not present

## 2016-01-03 DIAGNOSIS — M25611 Stiffness of right shoulder, not elsewhere classified: Secondary | ICD-10-CM | POA: Diagnosis not present

## 2016-01-06 DIAGNOSIS — M25562 Pain in left knee: Secondary | ICD-10-CM | POA: Diagnosis not present

## 2016-01-06 DIAGNOSIS — M75111 Incomplete rotator cuff tear or rupture of right shoulder, not specified as traumatic: Secondary | ICD-10-CM | POA: Diagnosis not present

## 2016-01-06 DIAGNOSIS — M25611 Stiffness of right shoulder, not elsewhere classified: Secondary | ICD-10-CM | POA: Diagnosis not present

## 2016-01-06 DIAGNOSIS — M25511 Pain in right shoulder: Secondary | ICD-10-CM | POA: Diagnosis not present

## 2016-01-06 DIAGNOSIS — M1712 Unilateral primary osteoarthritis, left knee: Secondary | ICD-10-CM | POA: Diagnosis not present

## 2016-01-08 DIAGNOSIS — M75111 Incomplete rotator cuff tear or rupture of right shoulder, not specified as traumatic: Secondary | ICD-10-CM | POA: Diagnosis not present

## 2016-01-10 DIAGNOSIS — M25611 Stiffness of right shoulder, not elsewhere classified: Secondary | ICD-10-CM | POA: Diagnosis not present

## 2016-01-10 DIAGNOSIS — M75111 Incomplete rotator cuff tear or rupture of right shoulder, not specified as traumatic: Secondary | ICD-10-CM | POA: Diagnosis not present

## 2016-01-10 DIAGNOSIS — M25511 Pain in right shoulder: Secondary | ICD-10-CM | POA: Diagnosis not present

## 2016-01-13 DIAGNOSIS — M1712 Unilateral primary osteoarthritis, left knee: Secondary | ICD-10-CM | POA: Diagnosis not present

## 2016-01-13 DIAGNOSIS — M25562 Pain in left knee: Secondary | ICD-10-CM | POA: Diagnosis not present

## 2016-01-14 DIAGNOSIS — M25611 Stiffness of right shoulder, not elsewhere classified: Secondary | ICD-10-CM | POA: Diagnosis not present

## 2016-01-14 DIAGNOSIS — M75111 Incomplete rotator cuff tear or rupture of right shoulder, not specified as traumatic: Secondary | ICD-10-CM | POA: Diagnosis not present

## 2016-01-14 DIAGNOSIS — M25511 Pain in right shoulder: Secondary | ICD-10-CM | POA: Diagnosis not present

## 2016-01-15 DIAGNOSIS — M25511 Pain in right shoulder: Secondary | ICD-10-CM | POA: Diagnosis not present

## 2016-01-15 DIAGNOSIS — M25611 Stiffness of right shoulder, not elsewhere classified: Secondary | ICD-10-CM | POA: Diagnosis not present

## 2016-01-15 DIAGNOSIS — M75111 Incomplete rotator cuff tear or rupture of right shoulder, not specified as traumatic: Secondary | ICD-10-CM | POA: Diagnosis not present

## 2016-01-22 DIAGNOSIS — M25511 Pain in right shoulder: Secondary | ICD-10-CM | POA: Diagnosis not present

## 2016-01-22 DIAGNOSIS — M75111 Incomplete rotator cuff tear or rupture of right shoulder, not specified as traumatic: Secondary | ICD-10-CM | POA: Diagnosis not present

## 2016-01-22 DIAGNOSIS — M25611 Stiffness of right shoulder, not elsewhere classified: Secondary | ICD-10-CM | POA: Diagnosis not present

## 2016-01-27 DIAGNOSIS — J0100 Acute maxillary sinusitis, unspecified: Secondary | ICD-10-CM | POA: Diagnosis not present

## 2016-01-27 DIAGNOSIS — J309 Allergic rhinitis, unspecified: Secondary | ICD-10-CM | POA: Diagnosis not present

## 2016-01-28 DIAGNOSIS — M25611 Stiffness of right shoulder, not elsewhere classified: Secondary | ICD-10-CM | POA: Diagnosis not present

## 2016-01-28 DIAGNOSIS — M75111 Incomplete rotator cuff tear or rupture of right shoulder, not specified as traumatic: Secondary | ICD-10-CM | POA: Diagnosis not present

## 2016-01-28 DIAGNOSIS — M25511 Pain in right shoulder: Secondary | ICD-10-CM | POA: Diagnosis not present

## 2016-02-12 DIAGNOSIS — M25511 Pain in right shoulder: Secondary | ICD-10-CM | POA: Diagnosis not present

## 2016-02-13 DIAGNOSIS — M79671 Pain in right foot: Secondary | ICD-10-CM | POA: Diagnosis not present

## 2016-02-13 DIAGNOSIS — M79672 Pain in left foot: Secondary | ICD-10-CM | POA: Diagnosis not present

## 2016-02-13 DIAGNOSIS — B351 Tinea unguium: Secondary | ICD-10-CM | POA: Diagnosis not present

## 2016-02-13 DIAGNOSIS — E119 Type 2 diabetes mellitus without complications: Secondary | ICD-10-CM | POA: Diagnosis not present

## 2016-05-18 DIAGNOSIS — M79671 Pain in right foot: Secondary | ICD-10-CM | POA: Diagnosis not present

## 2016-05-18 DIAGNOSIS — M79672 Pain in left foot: Secondary | ICD-10-CM | POA: Diagnosis not present

## 2016-05-18 DIAGNOSIS — B351 Tinea unguium: Secondary | ICD-10-CM | POA: Diagnosis not present

## 2016-05-18 DIAGNOSIS — E119 Type 2 diabetes mellitus without complications: Secondary | ICD-10-CM | POA: Diagnosis not present

## 2016-07-09 DIAGNOSIS — M25562 Pain in left knee: Secondary | ICD-10-CM | POA: Diagnosis not present

## 2016-07-09 DIAGNOSIS — R262 Difficulty in walking, not elsewhere classified: Secondary | ICD-10-CM | POA: Diagnosis not present

## 2016-07-09 DIAGNOSIS — M1712 Unilateral primary osteoarthritis, left knee: Secondary | ICD-10-CM | POA: Diagnosis not present

## 2016-07-09 DIAGNOSIS — Z96651 Presence of right artificial knee joint: Secondary | ICD-10-CM | POA: Diagnosis not present

## 2016-07-14 DIAGNOSIS — M1712 Unilateral primary osteoarthritis, left knee: Secondary | ICD-10-CM | POA: Diagnosis not present

## 2016-07-14 DIAGNOSIS — M25562 Pain in left knee: Secondary | ICD-10-CM | POA: Diagnosis not present

## 2016-07-22 DIAGNOSIS — M25562 Pain in left knee: Secondary | ICD-10-CM | POA: Diagnosis not present

## 2016-07-22 DIAGNOSIS — M1712 Unilateral primary osteoarthritis, left knee: Secondary | ICD-10-CM | POA: Diagnosis not present

## 2016-08-05 DIAGNOSIS — M1712 Unilateral primary osteoarthritis, left knee: Secondary | ICD-10-CM | POA: Diagnosis not present

## 2016-08-05 DIAGNOSIS — M25562 Pain in left knee: Secondary | ICD-10-CM | POA: Diagnosis not present

## 2016-08-05 DIAGNOSIS — R262 Difficulty in walking, not elsewhere classified: Secondary | ICD-10-CM | POA: Diagnosis not present

## 2016-08-06 DIAGNOSIS — M25562 Pain in left knee: Secondary | ICD-10-CM | POA: Diagnosis not present

## 2016-11-17 DIAGNOSIS — M25562 Pain in left knee: Secondary | ICD-10-CM | POA: Diagnosis not present

## 2016-11-17 DIAGNOSIS — R262 Difficulty in walking, not elsewhere classified: Secondary | ICD-10-CM | POA: Diagnosis not present

## 2016-11-17 DIAGNOSIS — M1712 Unilateral primary osteoarthritis, left knee: Secondary | ICD-10-CM | POA: Diagnosis not present

## 2016-12-28 DIAGNOSIS — M79671 Pain in right foot: Secondary | ICD-10-CM | POA: Diagnosis not present

## 2016-12-28 DIAGNOSIS — B351 Tinea unguium: Secondary | ICD-10-CM | POA: Diagnosis not present

## 2016-12-28 DIAGNOSIS — M79672 Pain in left foot: Secondary | ICD-10-CM | POA: Diagnosis not present

## 2016-12-28 DIAGNOSIS — E119 Type 2 diabetes mellitus without complications: Secondary | ICD-10-CM | POA: Diagnosis not present

## 2017-03-29 DIAGNOSIS — M79671 Pain in right foot: Secondary | ICD-10-CM | POA: Diagnosis not present

## 2017-03-29 DIAGNOSIS — B351 Tinea unguium: Secondary | ICD-10-CM | POA: Diagnosis not present

## 2017-03-29 DIAGNOSIS — E119 Type 2 diabetes mellitus without complications: Secondary | ICD-10-CM | POA: Diagnosis not present

## 2017-03-29 DIAGNOSIS — M79672 Pain in left foot: Secondary | ICD-10-CM | POA: Diagnosis not present

## 2017-04-28 ENCOUNTER — Ambulatory Visit (INDEPENDENT_AMBULATORY_CARE_PROVIDER_SITE_OTHER): Payer: Medicare Other

## 2017-04-28 ENCOUNTER — Ambulatory Visit (INDEPENDENT_AMBULATORY_CARE_PROVIDER_SITE_OTHER): Payer: Medicare Other | Admitting: Orthopedic Surgery

## 2017-04-28 ENCOUNTER — Encounter (INDEPENDENT_AMBULATORY_CARE_PROVIDER_SITE_OTHER): Payer: Self-pay | Admitting: Orthopedic Surgery

## 2017-04-28 DIAGNOSIS — G8929 Other chronic pain: Secondary | ICD-10-CM

## 2017-04-28 DIAGNOSIS — M25561 Pain in right knee: Secondary | ICD-10-CM

## 2017-04-28 NOTE — Progress Notes (Signed)
Office Visit Note   Patient: Thomas [name]           Date of Birth: Jun 13, 1946           MRN: [id number] Visit Date: 04/28/2017 Requested by: [name], MD [address] [place], [place] [address] PCP: [name], MD  Subjective: Chief Complaint  Patient presents with  . Right Knee - Pain    HPI: Thomas [name] is a 71 year old patient with right and left knee pain.  He states that his pain has become gradually worse over time in both knees but particularly on the right-hand side.  Had total knee replacement 5 years ago at the [facility].  He is currently taking oxycodone and methadone for back pain and right knee pain.  He has a history of going to the [facility] clinic where injections have given him relief on that left hand side.  He has had 3 series of injections each with diminishing returns however.  He went to this clinic 2 years ago.  He reports some pain in the right knee which is nonspecific.  Denies any fevers or chills.              ROS: All systems reviewed are negative as they relate to the chief complaint within the history of present illness.  Patient denies  fevers or chills.   Assessment & Plan: Visit Diagnoses:  1. Chronic pain of right knee     Plan: Impression is right knee pain with reasonable looking total knee replacement on radiographs.  No clinical evidence of infection or loosening and there is no radiographic evidence of infection or loosening.  I think Thomas [name]'s best bet is to roll with this for now.  I don't think there is a clear indication for any type of surgical intervention on the right knee.  He has some fairly significant left knee arthritis as well but at the point that he can live with it for now.  He may need total knee replacement in the future but it would be a difficult past for him to be on considering the amount of pain medicine he is on for his back.  I'll see him back as needed  Follow-Up Instructions: Return if symptoms worsen or fail to  improve.   Orders:  Orders Placed This Encounter  Procedures  . XR KNEE 3 VIEW RIGHT   No orders of the defined types were placed in this encounter.     Procedures: No procedures performed   Clinical Data: No additional findings.  Objective: Vital Signs: There were no vitals taken for this visit.  Physical Exam:   Constitutional: Patient appears well-developed HEENT:  Head: Normocephalic Eyes:EOM are normal Neck: Normal range of motion Cardiovascular: Normal rate Pulmonary/chest: Effort normal Neurologic: Patient is alert Skin: Skin is warm Psychiatric: Patient has normal mood and affect    Ortho Exam: Orthopedic exam demonstrates normal gait and alignment.  Pedal pulses palpable.  Some venous varicosities are noted on bilateral lower chemise.  Right knee has full extension and flexion to 95 with no effusion no warmth and good patella tracking no mid flexion instability.  On the left-hand side patient has medial joint line tenderness with stable collateral cruciate ligaments intact extensor mechanism and no effusion.  Range of motion is full.  Collateral and cruciate ligaments are stable.  Specialty Comments:  No specialty comments available.  Imaging: Xr Knee 3 View Right  Result Date: 04/28/2017 AP lateral merchant right knee reviewed.  Well seated in position total knee replacement is observed.  Only slight lucency behind the anterior fin of the prosthesis.  No evidence of loosening on the tibial side.  Patella centered in the trochlea on the merchant view.  No evidence of hardware complication    PMFS History: There are no active problems to display for this patient.  Past Medical History:  Diagnosis Date  . Back pain   . Hypertension     No family history on file.  No past surgical history on file. Social History   Occupational History  . Not on file.   Social History Main Topics  . Smoking status: Current Every Day Smoker    Packs/day: 1.00  .  Smokeless tobacco: Never Used  . Alcohol use No  . Drug use: No  . Sexual activity: No

## 2017-05-12 DIAGNOSIS — M545 Low back pain, unspecified: Secondary | ICD-10-CM | POA: Insufficient documentation

## 2017-05-12 DIAGNOSIS — L219 Seborrheic dermatitis, unspecified: Secondary | ICD-10-CM

## 2017-05-12 DIAGNOSIS — G253 Myoclonus: Secondary | ICD-10-CM

## 2017-05-12 DIAGNOSIS — E119 Type 2 diabetes mellitus without complications: Secondary | ICD-10-CM | POA: Insufficient documentation

## 2017-05-12 DIAGNOSIS — M1712 Unilateral primary osteoarthritis, left knee: Secondary | ICD-10-CM | POA: Insufficient documentation

## 2017-05-12 DIAGNOSIS — E785 Hyperlipidemia, unspecified: Secondary | ICD-10-CM

## 2017-05-12 DIAGNOSIS — N402 Nodular prostate without lower urinary tract symptoms: Secondary | ICD-10-CM

## 2017-05-12 DIAGNOSIS — R202 Paresthesia of skin: Secondary | ICD-10-CM | POA: Insufficient documentation

## 2017-05-12 DIAGNOSIS — F329 Major depressive disorder, single episode, unspecified: Secondary | ICD-10-CM | POA: Insufficient documentation

## 2017-05-12 DIAGNOSIS — F32A Depression, unspecified: Secondary | ICD-10-CM

## 2017-05-12 DIAGNOSIS — L570 Actinic keratosis: Secondary | ICD-10-CM | POA: Insufficient documentation

## 2017-05-12 DIAGNOSIS — Z860100 Personal history of colon polyps, unspecified: Secondary | ICD-10-CM

## 2017-05-12 DIAGNOSIS — I110 Hypertensive heart disease with heart failure: Secondary | ICD-10-CM | POA: Insufficient documentation

## 2017-05-12 DIAGNOSIS — M179 Osteoarthritis of knee, unspecified: Secondary | ICD-10-CM

## 2017-05-12 DIAGNOSIS — G4730 Sleep apnea, unspecified: Secondary | ICD-10-CM | POA: Insufficient documentation

## 2017-05-12 DIAGNOSIS — M5416 Radiculopathy, lumbar region: Secondary | ICD-10-CM

## 2017-05-12 DIAGNOSIS — E291 Testicular hypofunction: Secondary | ICD-10-CM

## 2017-05-12 DIAGNOSIS — E669 Obesity, unspecified: Secondary | ICD-10-CM

## 2017-05-12 DIAGNOSIS — G608 Other hereditary and idiopathic neuropathies: Secondary | ICD-10-CM

## 2017-05-12 DIAGNOSIS — G629 Polyneuropathy, unspecified: Secondary | ICD-10-CM

## 2017-05-12 DIAGNOSIS — M1710 Unilateral primary osteoarthritis, unspecified knee: Secondary | ICD-10-CM

## 2017-05-12 DIAGNOSIS — K219 Gastro-esophageal reflux disease without esophagitis: Secondary | ICD-10-CM | POA: Insufficient documentation

## 2017-05-12 HISTORY — DX: Other hereditary and idiopathic neuropathies: G60.8

## 2017-05-12 HISTORY — DX: Actinic keratosis: L57.0

## 2017-05-12 HISTORY — DX: Personal history of colonic polyps: Z86.010

## 2017-05-12 HISTORY — DX: Seborrheic dermatitis, unspecified: L21.9

## 2017-05-12 HISTORY — DX: Myoclonus: G25.3

## 2017-05-12 HISTORY — DX: Personal history of colon polyps, unspecified: Z86.0100

## 2017-05-12 HISTORY — DX: Depression, unspecified: F32.A

## 2017-05-12 HISTORY — DX: Low back pain, unspecified: M54.50

## 2017-05-12 HISTORY — DX: Obesity, unspecified: E66.9

## 2017-05-12 HISTORY — DX: Gastro-esophageal reflux disease without esophagitis: K21.9

## 2017-05-12 HISTORY — DX: Hyperlipidemia, unspecified: E78.5

## 2017-05-12 HISTORY — DX: Nodular prostate without lower urinary tract symptoms: N40.2

## 2017-05-12 HISTORY — DX: Radiculopathy, lumbar region: M54.16

## 2017-05-12 HISTORY — DX: Unilateral primary osteoarthritis, unspecified knee: M17.10

## 2017-05-12 HISTORY — DX: Paresthesia of skin: R20.2

## 2017-05-12 HISTORY — DX: Testicular hypofunction: E29.1

## 2017-05-12 HISTORY — DX: Osteoarthritis of knee, unspecified: M17.9

## 2017-05-13 ENCOUNTER — Encounter: Payer: Self-pay | Admitting: Cardiology

## 2017-05-13 DIAGNOSIS — I350 Nonrheumatic aortic (valve) stenosis: Secondary | ICD-10-CM | POA: Insufficient documentation

## 2017-05-13 DIAGNOSIS — I5032 Chronic diastolic (congestive) heart failure: Secondary | ICD-10-CM | POA: Insufficient documentation

## 2017-05-13 NOTE — Progress Notes (Signed)
Cardiology Office Note:    Date:  05/14/2017   ID:  KOLESON REIFSTECK, DOB 12/14/1945, MRN [id number]  PCP:  [name], MD  Cardiologist:  Shirlee More, MD   Referring MD: [name], MD  ASSESSMENT:    1. Chronic diastolic heart failure (Thomas [name])   2. Hypertensive heart disease with heart failure ([place])   3. Aortic stenosis, mild   4. Abnormal EKG    PLAN:    In order of problems listed above:  1. He has edema but I'm uncertain clinically if he has what I would call dia heart failure. Shortness of breath is not prominent in this pattern of filling noted on the echo can be seen due to age and hypertension without heart failure.For further evaluation he'll have a for confirmation BNP level performed which typically is significantly elevated with diastolic heart failure and for the time being I will continue his loop diuretic. Presently he sodium restricts and he'll continue this diet. 2. Stable continue current treatment if orthostatic lightheadedness is prominent I would stop his alpha adrenergic blocker 3. Physical examination is quite unremarkable he should have a folow-up echocardiogram in approximately 2 years 4. Abnormal EKG with multiple cardiovascular risk factors, he'll undergo myocardial perfusion study for risk stratification.   Next appointment 4 weeks   Medication Adjustments/Labs and Tests Ordered: Current medicines are reviewed at length with the patient today.  Concerns regarding medicines are outlined above.  Orders Placed This Encounter  Procedures  . B Nat Peptide  . Myocardial Perfusion Imaging  . EKG 12-Lead   No orders of the defined types were placed in this encounter.    Chief Complaint  Patient presents with  . New Patient (Initial Visit)    to evaluate CHF  . Edema  . Chest Pain  . Dizziness  . Congestive Heart Failure    History of Present Illness:    Thomas [name] is a 71 y.o. male who is being seen today for the evaluation of heart  failure at the request of [name], MD. He has been seen at [facility] by cardiology for HFpEF with moderate LVH, LAE , mild AS and a small pericardial effusion. He had a recent ED visit with vertigo and orthostatic hypotension. His CAD risks include HTN, T2DM, hyperlipidemia and cig smoking.  His story begins with a CT scan to screen for lung cancer. It showed a small pericardial effusion leading to an echocardiogram which showed diastolic dysfunction, left ventricular hypertrophy mild aortic stenosis and confirmed a small amount of pericardial fluid. He was seen by cardiology diagnosed as diastolic heart failure and placed on a loop diuretic. There is a mention in the consultation that he had exertional chest pain but he did not have an ischemia evaluation. He subsequently research the topic of diastolic heart failure and is very concerned that he has a high mortality in the next 5 years and has CAD.  He has a history of mild exertional shortness of breath walking outdoors but no orthopnea PND or shortness of breath with ADLs during usual activity.he adamantly denies any chest discomfort palpitations syncope or TIA.[name] He had no previous notation of valvular heart disease when he is in the and no history of congenital or rheumatic heart disease. He does have edema but he takes gabapentin which can cause this phenomenon. He is on Hytrin but denies BPH. He thinks it may be an antihypertensive agent for him  Past Medical History:  Diagnosis Date  . Actinic  keratosis 05/12/2017  . Back pain   . Depression 05/12/2017  . Diabetes ([place])   . GERD (gastroesophageal reflux disease) 05/12/2017  . Hx of colonic polyps 05/12/2017  . Hyperlipidemia 05/12/2017  . Hypertension   . 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 Low back pain 05/12/2017  . Lumbar radiculopathy 05/12/2017  . 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 Obesity 05/12/2017  . Osteoarthritis of knee 05/12/2017  . Paresthesia 05/12/2017  . 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  Prostate nodule 05/12/2017  . Seborrheic dermatitis 05/12/2017    Past Surgical History:  Procedure Laterality Date  . HAND SURGERY    . REPLACEMENT TOTAL KNEE Right   . SHOULDER SURGERY Right     Current Medications: Current Meds  Medication Sig  . aspirin 81 MG chewable tablet Chew 81 mg by mouth daily.  [name] atorvastatin (LIPITOR) 80 MG tablet Take 80 mg by mouth daily.  . baclofen (LIORESAL) 10 MG tablet Take 10 mg by mouth daily.  . DULoxetine (CYMBALTA) 20 MG capsule Take 20 mg by mouth daily.  [name] EPINEPHrine 0.3 mg/0.3 mL IJ SOAJ injection Inject 0.3 mg into the muscle once.  . furosemide (LASIX) 40 MG tablet Take 40 mg by mouth daily.  [name] gabapentin (NEURONTIN) 400 MG capsule Take 400 mg by mouth 2 (two) times daily.  [name] glipiZIDE (GLUCOTROL) 5 MG tablet Take 2.5 mg by mouth 2 (two) times daily before a meal.  . loratadine (CLARITIN) 10 MG tablet Take 10 mg by mouth daily.  [name] losartan (COZAAR) 25 MG tablet Take 25 mg by mouth daily.  . Meclizine HCl 25 MG CHEW Chew 1 tablet by mouth 2 (two) times daily as needed (dizziness).  . metFORMIN (GLUCOPHAGE) 1000 MG tablet Take 1,000 mg by mouth 2 (two) times daily with a meal.  . methadone (DOLOPHINE) 5 MG tablet Take 5 mg by mouth daily.  [name] oxyCODONE-acetaminophen (PERCOCET/ROXICET) 5-325 MG tablet Take 1 tablet by mouth 5 (five) times daily as needed for severe pain.   . salsalate (DISALCID) 750 MG tablet Take 1,500 mg by mouth 2 (two) times daily.  . sildenafil (VIAGRA) 50 MG tablet Take 25 mg by mouth daily as needed for erectile dysfunction.  [name] terazosin (HYTRIN) 2 MG capsule Take 2 mg by mouth at bedtime.     Allergies:   Wasp venom   Social History   Social History  . Marital status: Married    Spouse name: N/A  . Number of children: N/A  . Years of education: N/A   Social History Main Topics  . Smoking status: Current Every Day Smoker    Packs/day: 1.50    Years: 55.00  . Smokeless tobacco: Never Used  . Alcohol use No    . Drug use: No  . Sexual activity: No   Other Topics Concern  . None   Social History Narrative  . None     Family History: The patient's family history includes Alzheimer's disease in his father; Hypertension in his father and mother.  ROS:   ROS Please see the history of present illness.    He has a chronic pain syndrome  All other systems reviewed and are negative.  EKGs/Labs/Other Studies Reviewed:    The following studies were reviewed today: [facility] records reviewed prior to visit  EKG:  EKG is  ordered today.  The ekg ordered today demonstrates [place], LAHB and lateral T wave inversion. Echo 07/10/16 with EF >55%, moderate LVH, pseudonormal diastolic filling, mild AS P/M  20/10, moderate LAE and a small PE. Recent Labs: 03/16/17 CBC normal, CMP normal except Cr 1.3 K 3.8 No results found for requested labs within last 8760 hours.  Recent Lipid Panel Chol 131, HDL 99, ,LDL 94 No results found for: CHOL, TRIG, HDL, CHOLHDL, VLDL, LDLCALC, LDLDIRECT  Physical Exam:    VS:  BP 130/80 (BP Location: Left Arm, Patient Position: Sitting)   Pulse 78   Ht 5' 11" (1.803 m)   Wt 250 lb 6.4 oz (113.6 kg)   SpO2 95%   BMI 34.92 kg/m     Wt Readings from Last 3 Encounters:  05/14/17 250 lb 6.4 oz (113.6 kg)  06/29/15 240 lb (108.9 kg)  12/30/14 250 lb (113.4 kg)     GEN: he appears his agehe has significant obesity.  in no acute distress HEENT: Normal NECK: No JVD; No carotid bruits LYMPHATICS: No lymphadenopathy CARDIAC: grade 1/6 systolic ejection murmur aortic area peaks early S2 is normal no AR RRR, rubs, gallops RESPIRATORY:  Clear to auscultation without rales, wheezing or rhonchi  ABDOMEN: Soft, non-tender, non-distended MUSCULOSKELETAL:  2+ pitting edema to the knee bilaterally; No deformity  SKIN: Warm and dry NEUROLOGIC:  Alert and oriented x 3 PSYCHIATRIC:  Normal affect     Signed, Shirlee More, MD  05/14/2017 2:37 PM    [facility]

## 2017-05-14 ENCOUNTER — Ambulatory Visit (INDEPENDENT_AMBULATORY_CARE_PROVIDER_SITE_OTHER): Payer: Medicare Other | Admitting: Cardiology

## 2017-05-14 ENCOUNTER — Encounter: Payer: Self-pay | Admitting: Cardiology

## 2017-05-14 VITALS — BP 130/80 | HR 78 | Ht 71.0 in | Wt 250.4 lb

## 2017-05-14 DIAGNOSIS — I350 Nonrheumatic aortic (valve) stenosis: Secondary | ICD-10-CM | POA: Diagnosis not present

## 2017-05-14 DIAGNOSIS — R9431 Abnormal electrocardiogram [ECG] [EKG]: Secondary | ICD-10-CM

## 2017-05-14 DIAGNOSIS — I110 Hypertensive heart disease with heart failure: Secondary | ICD-10-CM

## 2017-05-14 DIAGNOSIS — I5032 Chronic diastolic (congestive) heart failure: Secondary | ICD-10-CM

## 2017-05-14 NOTE — Patient Instructions (Addendum)
Medication Instructions:  Your physician recommends that you continue on your current medications as directed. Please refer to the Current Medication list given to you today.   Labwork: Your physician recommends that you return for lab work in: today. BNP   Testing/Procedures: You had an EKG today.  Your physician has requested that you have en exercise stress myoview. For further information please visit [email or web address]. Please follow instruction sheet, as given.   Follow-Up: Your physician recommends that you schedule a follow-up appointment in: 3 weeks.   Any Other Special Instructions Will Be Listed Below (If Applicable).     If you need a refill on your cardiac medications before your next appointment, please call your pharmacy.    Heart Failure  Weigh yourself every morning when you first wake up and record on a calender or note pad, bring this to your office visits. Using a pill tender can help with taking your medications consistently.  Limit your fluid intake to 2 liters daily  Limit your sodium intake to less than 2-3 grams daily. Ask if you need dietary teaching.  If you gain more than 3 pounds (from your dry weight ), double your dose of diuretic for the day.  If you gain more than 5 pounds (from your dry weight), double your dose of lasix and call your heart failure doctor.  Please do not smoke tobacco since it is very bad for your heart.  Please do not drink alcohol since it can worsen your heart failure.Also avoid OTC nonsteroidal drugs, such as advil, aleve and motrin.  Try to exercise for at least 30 minutes every day because this will help your heart be more efficient. You may be eligible for supervised cardiac rehab, ask your physician.

## 2017-05-15 LAB — BRAIN NATRIURETIC PEPTIDE: BNP: 212 pg/mL — ABNORMAL HIGH (ref 0.0–100.0)

## 2017-05-17 ENCOUNTER — Telehealth (HOSPITAL_COMMUNITY): Payer: Self-pay | Admitting: *Deleted

## 2017-05-17 NOTE — Telephone Encounter (Signed)
Left message on voicemail per DPR in reference to upcoming appointment scheduled on 05/18/17 with detailed instructions given per Myocardial Perfusion Study Information Sheet for the test. LM to arrive 15 minutes early, and that it is imperative to arrive on time for appointment to keep from having the test rescheduled. If you need to cancel or reschedule your appointment, please call the office within 24 hours of your appointment. Failure to do so may result in a cancellation of your appointment, and a $50 no show fee. Phone number given for call back for any questions. Kirstie Peri

## 2017-05-18 ENCOUNTER — Ambulatory Visit (HOSPITAL_COMMUNITY): Payer: Medicare Other | Attending: Cardiology

## 2017-05-18 DIAGNOSIS — I350 Nonrheumatic aortic (valve) stenosis: Secondary | ICD-10-CM | POA: Diagnosis not present

## 2017-05-18 DIAGNOSIS — I5032 Chronic diastolic (congestive) heart failure: Secondary | ICD-10-CM | POA: Diagnosis not present

## 2017-05-18 DIAGNOSIS — I110 Hypertensive heart disease with heart failure: Secondary | ICD-10-CM | POA: Diagnosis not present

## 2017-05-18 LAB — MYOCARDIAL PERFUSION IMAGING
CHL CUP NUCLEAR SDS: 2
CHL CUP RESTING HR STRESS: 75 {beats}/min
LV dias vol: 156 mL (ref 62–150)
LVSYSVOL: 87 mL
Peak HR: 91 {beats}/min
RATE: 0.34
SRS: 1
SSS: 3
TID: 1.12

## 2017-05-18 MED ORDER — TECHNETIUM TC 99M TETROFOSMIN IV KIT
32.8000 | PACK | Freq: Once | INTRAVENOUS | Status: AC | PRN
  Administered 2017-05-18: 32.8 via INTRAVENOUS
  Filled 2017-05-18: qty 33

## 2017-05-18 MED ORDER — TECHNETIUM TC 99M TETROFOSMIN IV KIT
10.2000 | PACK | Freq: Once | INTRAVENOUS | Status: AC | PRN
  Administered 2017-05-18: 10.2 via INTRAVENOUS
  Filled 2017-05-18: qty 11

## 2017-05-18 MED ORDER — REGADENOSON 0.4 MG/5ML IV SOLN
0.4000 mg | Freq: Once | INTRAVENOUS | Status: AC
  Administered 2017-05-18: 0.4 mg via INTRAVENOUS

## 2017-06-02 NOTE — Progress Notes (Signed)
Cardiology Office Note:    Date:  06/03/2017   ID:  Thomas [name], DOB May 14, 1946, MRN [id number]  PCP:  [name], MD  Cardiologist:  Shirlee More, MD    Referring MD: [name], MD    ASSESSMENT:    1. Hypertensive heart disease with heart failure ([place])   2. Aortic stenosis, mild    PLAN:    In order of problems listed above:  1. He remains fluid overloaded will withdraw gabapentin and switch to a more potent diuretic torsemide. He'll continue to sodium restrict in 2 weeks follow-up renal function checked. His myocardial perfusion images show mildly reduced ejection fraction and no ischemia. I'll certainly await the results of cardiac CTA but doubt he requires revascularization.his systolic blood pressures not at target should respond to diuresis. 2. Mild stable aortic stenosis   Next appointment: 3 months   Medication Adjustments/Labs and Tests Ordered: Current medicines are reviewed at length with the patient today.  Concerns regarding medicines are outlined above.  Orders Placed This Encounter  Procedures  . Basic Metabolic Panel (BMET)   Meds ordered this encounter  Medications  . torsemide (DEMADEX) 20 MG tablet    Sig: Take 1 tablet (20 mg total) by mouth 2 (two) times daily.    Dispense:  60 tablet    Refill:  3    Chief Complaint  Patient presents with  . Follow-up    3 week flup appt  . Shortness of Breath  . Edema  . Dizziness  . Congestive Heart Failure    History of Present Illness:    Thomas [name] is a 71 y.o. male with a hx of diastolic HF, hypertension, mild aortic stenosis  last seen 3 weeks ago.he does salt restrict but still notices peripheral edema and shortness of breath intermittently with physical. He's had no chest pain orthopnea PND palpitation or syncope. He continues to be edematous I'll switch from furosemide to torsemide and we will withdraw gabapentin which is contributing to sodium retention..he alerts me he's having a  cardiac CTA done at the [facility].he is not having anginal discomfort Compliance with diet, lifestyle and medications: yes Past Medical History:  Diagnosis Date  . Actinic keratosis 05/12/2017  . Back pain   . Depression 05/12/2017  . Diabetes ([place])   . GERD (gastroesophageal reflux disease) 05/12/2017  . Hx of colonic polyps 05/12/2017  . Hyperlipidemia 05/12/2017  . Hypertension   . 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 Low back pain 05/12/2017  . Lumbar radiculopathy 05/12/2017  . 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 Obesity 05/12/2017  . Osteoarthritis of knee 05/12/2017  . Paresthesia 05/12/2017  . 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 Prostate nodule 05/12/2017  . Seborrheic dermatitis 05/12/2017    Past Surgical History:  Procedure Laterality Date  . HAND SURGERY    . REPLACEMENT TOTAL KNEE Right   . SHOULDER SURGERY Right     Current Medications: Current Meds  Medication Sig  . aspirin 81 MG chewable tablet Chew 81 mg by mouth daily.  [name] atorvastatin (LIPITOR) 80 MG tablet Take 80 mg by mouth daily.  . baclofen (LIORESAL) 10 MG tablet Take 10 mg by mouth daily.  . DULoxetine (CYMBALTA) 20 MG capsule Take 20 mg by mouth daily.  [name] EPINEPHrine 0.3 mg/0.3 mL IJ SOAJ injection Inject 0.3 mg into the muscle once.  [name] glipiZIDE (GLUCOTROL) 5 MG tablet Take 2.5 mg by mouth 2 (two) times daily before a meal.  . loratadine (CLARITIN) 10  MG tablet Take 10 mg by mouth daily.  [name] losartan (COZAAR) 25 MG tablet Take 25 mg by mouth daily.  . Meclizine HCl 25 MG CHEW Chew 1 tablet by mouth 2 (two) times daily as needed (dizziness).  . metFORMIN (GLUCOPHAGE) 1000 MG tablet Take 1,000 mg by mouth 2 (two) times daily with a meal.  . methadone (DOLOPHINE) 5 MG tablet Take 5 mg by mouth daily.  [name] oxyCODONE-acetaminophen (PERCOCET/ROXICET) 5-325 MG tablet Take 1 tablet by mouth 5 (five) times daily as needed for severe pain.   . salsalate (DISALCID) 750 MG tablet Take 1,500 mg by mouth 2 (two) times daily.  . sildenafil  (VIAGRA) 50 MG tablet Take 25 mg by mouth daily as needed for erectile dysfunction.  [name] terazosin (HYTRIN) 2 MG capsule Take 2 mg by mouth at bedtime.  . [DISCONTINUED] furosemide (LASIX) 40 MG tablet Take 40 mg by mouth daily.  . [DISCONTINUED] gabapentin (NEURONTIN) 400 MG capsule Take 400 mg by mouth 2 (two) times daily.     Allergies:   Wasp venom   Social History   Social History  . Marital status: Married    Spouse name: N/A  . Number of children: N/A  . Years of education: N/A   Social History Main Topics  . Smoking status: Current Every Day Smoker    Packs/day: 1.00    Years: 55.00  . Smokeless tobacco: Never Used  . Alcohol use No  . Drug use: No  . Sexual activity: No   Other Topics Concern  . None   Social History Narrative  . None     Family History: The patient's family history includes Alzheimer's disease in his father; Hypertension in his father and mother. ROS:   Please see the history of present illness.    All other systems reviewed and are negative.  EKGs/Labs/Other Studies Reviewed:    The following studies were reviewed today: lexiscan MPI: Study Highlights     The left ventricular ejection fraction is moderately decreased (30-44%).  Nuclear stress EF: 44%.  There was no ST segment deviation noted during stress.  This is an intermediate risk study.   Intermediate risk stress nuclear study with no ischemia or infarction; EF 44 but visually appears better; suggest echocardiogram to further assess; mild LVE; study interpreted as intermediate risk due to reduced LV function.    Recent Labs: 05/14/2017: BNP 212.0  Recent Lipid Panel No results found for: CHOL, TRIG, HDL, CHOLHDL, VLDL, LDLCALC, LDLDIRECT  Physical Exam:    VS:  BP (!) 156/84 (BP Location: Left Arm, Patient Position: Sitting)   Pulse 72   Ht _0  (1.803 m)   Wt 253 lb 1.9 oz (114.8 kg)   SpO2 96%   BMI 35.30 kg/m     Wt Readings from Last 3 Encounters:  06/03/17  253 lb 1.9 oz (114.8 kg)  05/18/17 250 lb (113.4 kg)  05/14/17 250 lb 6.4 oz (113.6 kg)     GEN:  Well nourished, well developed in no acute distress HEENT: Normal NECK: No JVD; No carotid bruits LYMPHATICS: No lymphadenopathy CARDIAC: RRR, no murmurs, rubs, gallops RESPIRATORY:  Clear to auscultation without rales, wheezing or rhonchi  ABDOMEN: Soft, non-tender, non-distended MUSCULOSKELETAL:  2-3+ edema; No deformity  SKIN: Warm and dry NEUROLOGIC:  Alert and oriented x 3 PSYCHIATRIC:  Normal affect    Signed, Shirlee More, MD  06/03/2017 2:18 PM    [facility]

## 2017-06-03 ENCOUNTER — Ambulatory Visit (INDEPENDENT_AMBULATORY_CARE_PROVIDER_SITE_OTHER): Payer: Medicare Other | Admitting: Cardiology

## 2017-06-03 ENCOUNTER — Encounter: Payer: Self-pay | Admitting: Cardiology

## 2017-06-03 VITALS — BP 156/84 | HR 72 | Ht 71.0 in | Wt 253.1 lb

## 2017-06-03 DIAGNOSIS — I110 Hypertensive heart disease with heart failure: Secondary | ICD-10-CM

## 2017-06-03 DIAGNOSIS — I350 Nonrheumatic aortic (valve) stenosis: Secondary | ICD-10-CM | POA: Diagnosis not present

## 2017-06-03 MED ORDER — TORSEMIDE 20 MG PO TABS: 20.0000 mg | ORAL_TABLET | Freq: Two times a day (BID) | ORAL | 3 refills | Status: DC

## 2017-06-03 NOTE — Patient Instructions (Addendum)
Medication Instructions:  Your physician has recommended you make the following change in your medication:  STOP gabapentin STOP furosemide START torsemide 20 mg twice daily  Labwork: Your physician recommends that you return for lab work in: 2 weeks. BMP   Testing/Procedures: None  Follow-Up: Your physician recommends that you schedule a follow-up appointment in: 3 months.   Any Other Special Instructions Will Be Listed Below (If Applicable).     If you need a refill on your cardiac medications before your next appointment, please call your pharmacy.    KNOW YOUR HEART FAILURE ZONES  Green Zone (Your Goal):  [name] No shortness of breath or trouble bleeding . No weight gain of more than 3 pounds in one day or 5 pounds in a week . No swelling in your ankles, feet, stomach, or hands . No chest discomfort, heaviness or pain  Yellow Zone (Call the Doctor to get help!): Having 1 or more of the following: . Weight gain of 3 pounds in 1 day or 5 pounds in 1 week . More swelling of your feet, ankles, stomach, or hands . It is harder to breath when lying down. You need to sit up . Chest discomfort, heaviness, or pain . You feel more tired or have less energy than normal . New or worsening dizziness . Dry hacking cough . You feel uneasy and you know something is not right  Red Zone (Call 911-EMERGENCY): . Struggling to breathe. This does not go away when you sit up. . Stronger and more regular amounts of chest discomfort . New confusion or can't think clearly . Fainting or near fainting   Resources form the American heart Association: RetroDivas.ch.jsp

## 2017-06-21 ENCOUNTER — Other Ambulatory Visit: Payer: Self-pay

## 2017-06-21 DIAGNOSIS — I110 Hypertensive heart disease with heart failure: Secondary | ICD-10-CM

## 2017-06-22 LAB — BASIC METABOLIC PANEL
BUN / CREAT RATIO: 12 (ref 10–24)
BUN: 13 mg/dL (ref 8–27)
CALCIUM: 9.1 mg/dL (ref 8.6–10.2)
CHLORIDE: 104 mmol/L (ref 96–106)
CO2: 22 mmol/L (ref 20–29)
Creatinine, Ser: 1.08 mg/dL (ref 0.76–1.27)
GFR calc non Af Amer: 68 mL/min/{1.73_m2} (ref >59)
GFR, EST AFRICAN AMERICAN: 79 mL/min/{1.73_m2} (ref >59)
Glucose: 147 mg/dL — ABNORMAL HIGH (ref 65–99)
POTASSIUM: 4.4 mmol/L (ref 3.5–5.2)
SODIUM: 143 mmol/L (ref 134–144)

## 2017-06-29 DIAGNOSIS — B351 Tinea unguium: Secondary | ICD-10-CM | POA: Diagnosis not present

## 2017-06-29 DIAGNOSIS — E119 Type 2 diabetes mellitus without complications: Secondary | ICD-10-CM | POA: Diagnosis not present

## 2017-06-29 DIAGNOSIS — M79671 Pain in right foot: Secondary | ICD-10-CM | POA: Diagnosis not present

## 2017-06-29 DIAGNOSIS — M79672 Pain in left foot: Secondary | ICD-10-CM | POA: Diagnosis not present

## 2017-08-23 ENCOUNTER — Telehealth: Payer: Self-pay | Admitting: Cardiology

## 2017-08-23 NOTE — Telephone Encounter (Signed)
Patient had a little blood in his urine this morning. Patient states that it wasn't much, but enough for him to see and was concerned about it. Patient takes torsemide. Patient has urinated 4 times since this morning and urine has been clear. Please advise.

## 2017-08-23 NOTE — Telephone Encounter (Signed)
Patient states that he had blood in urine this am and taking a fluid pills and would like to talk to someone.[name]

## 2017-08-23 NOTE — Telephone Encounter (Signed)
Would discuss with PCP at [facility], I dont think that the diuretic is an issue.

## 2017-08-23 NOTE — Telephone Encounter (Signed)
Patient advised to call PCP if it happens again. Patient verbalized understanding. No further questions.

## 2017-09-02 ENCOUNTER — Encounter: Payer: Self-pay | Admitting: Cardiology

## 2017-09-02 ENCOUNTER — Ambulatory Visit (INDEPENDENT_AMBULATORY_CARE_PROVIDER_SITE_OTHER): Payer: Medicare Other | Admitting: Cardiology

## 2017-09-02 VITALS — BP 122/62 | HR 79 | Ht 71.0 in | Wt 241.0 lb

## 2017-09-02 DIAGNOSIS — I5032 Chronic diastolic (congestive) heart failure: Secondary | ICD-10-CM | POA: Diagnosis not present

## 2017-09-02 DIAGNOSIS — I110 Hypertensive heart disease with heart failure: Secondary | ICD-10-CM

## 2017-09-02 DIAGNOSIS — I350 Nonrheumatic aortic (valve) stenosis: Secondary | ICD-10-CM | POA: Diagnosis not present

## 2017-09-02 NOTE — Progress Notes (Signed)
Cardiology Office Note:    Date:  09/02/2017   ID:  Thomas [name], DOB [birth year], MRN [id number]  PCP:  [name], MD  Cardiologist:  Shirlee More, MD    Referring MD: [name], MD    ASSESSMENT:    1. Chronic diastolic heart failure ([place])   2. Hypertensive heart disease with heart failure ([place])   3. Aortic stenosis, mild    PLAN:    In order of problems listed above:  1. Stable compensated continue his loop diuretic and sodium restriction 2. Stable continue current treatment 3. Mild and stable by physical examination   Next appointment: 3 months   Medication Adjustments/Labs and Tests Ordered: Current medicines are reviewed at length with the patient today.  Concerns regarding medicines are outlined above.  Orders Placed This Encounter  Procedures  . Basic Metabolic Panel (BMET)   No orders of the defined types were placed in this encounter.   Chief Complaint  Patient presents with  . Follow-up  . Congestive Heart Failure    History of Present Illness:    Thomas [name] is a 71 y.o. male with a hx of diastolic HFEF 40%, hypertension, mild aortic stenosis  last seen 3 months ago.. Compliance with diet, lifestyle and medications: Yes In the interim he has had a chest CT performed unfortunately I do not have that report.  His wife relates that was reassuring.  He is not having edema or shortness of breath no palpitations syncope or chest pain.  He is planning extended trip to [place] in [place] and I will check a BMP today with his loop diuretic therapy. Past Medical History:  Diagnosis Date  . Actinic keratosis 05/12/2017  . Back pain   . Depression 05/12/2017  . Diabetes ([place])   . GERD (gastroesophageal reflux disease) 05/12/2017  . Hx of colonic polyps 05/12/2017  . Hyperlipidemia 05/12/2017  . Hypertension   . 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 Low back pain 05/12/2017  . Lumbar radiculopathy 05/12/2017  . 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 Obesity 05/12/2017    . Osteoarthritis of knee 05/12/2017  . Paresthesia 05/12/2017  . 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 Prostate nodule 05/12/2017  . Seborrheic dermatitis 05/12/2017    Past Surgical History:  Procedure Laterality Date  . HAND SURGERY    . REPLACEMENT TOTAL KNEE Right   . SHOULDER SURGERY Right     Current Medications: Current Meds  Medication Sig  . aspirin 81 MG chewable tablet Chew 81 mg by mouth daily.  [name] atorvastatin (LIPITOR) 80 MG tablet Take 80 mg by mouth daily.  . DULoxetine (CYMBALTA) 20 MG capsule Take 20 mg by mouth daily.  [name] EPINEPHrine 0.3 mg/0.3 mL IJ SOAJ injection Inject 0.3 mg into the muscle once.  [name] glipiZIDE (GLUCOTROL) 5 MG tablet Take 2.5 mg by mouth 2 (two) times daily before a meal.  . loratadine (CLARITIN) 10 MG tablet Take 10 mg by mouth daily.  [name] losartan (COZAAR) 25 MG tablet Take 25 mg by mouth daily.  . Meclizine HCl 25 MG CHEW Chew 1 tablet by mouth 2 (two) times daily as needed (dizziness).  . metFORMIN (GLUCOPHAGE) 1000 MG tablet Take 1,000 mg by mouth 2 (two) times daily with a meal.  . methadone (DOLOPHINE) 5 MG tablet Take 5 mg by mouth daily.  [name] oxyCODONE-acetaminophen (PERCOCET/ROXICET) 5-325 MG tablet Take 1 tablet by mouth 5 (five) times daily as needed for severe pain.   . salsalate (DISALCID) 750 MG tablet  Take 1,500 mg by mouth 2 (two) times daily.  [name] terazosin (HYTRIN) 2 MG capsule Take 2 mg by mouth at bedtime.  . torsemide (DEMADEX) 20 MG tablet Take 1 tablet (20 mg total) by mouth 2 (two) times daily.     Allergies:   Wasp venom   Social History   Socioeconomic History  . Marital status: Married    Spouse name: None  . Number of children: None  . Years of education: None  . Highest education level: None  Social Needs  . Financial resource strain: None  . Food insecurity - worry: None  . Food insecurity - inability: None  . Transportation needs - medical: None  . Transportation needs - non-medical: None  Occupational  History  . None  Tobacco Use  . Smoking status: Current Every Day Smoker    Packs/day: 1.00    Years: 55.00    Pack years: 55.00  . Smokeless tobacco: Never Used  Substance and Sexual Activity  . Alcohol use: No  . Drug use: No  . Sexual activity: No    Birth control/protection: None  Other Topics Concern  . None  Social History Narrative  . None     Family History: The patient's family history includes Alzheimer's disease in his father; Hypertension in his father and mother. ROS:   Please see the history of present illness.    All other systems reviewed and are negative.  EKGs/Labs/Other Studies Reviewed:    The following studies were reviewed today  Recent Labs: 05/14/2017: BNP 212.0 06/21/2017: BUN 13; Creatinine, Ser 1.08; Potassium 4.4; Sodium 143  Recent Lipid Panel No results found for: CHOL, TRIG, HDL, CHOLHDL, VLDL, LDLCALC, LDLDIRECT  Physical Exam:    VS:  BP 122/62   Pulse 79   Ht _0  (1.803 m)   Wt 241 lb (109.3 kg)   SpO2 98%   BMI 33.61 kg/m     Wt Readings from Last 3 Encounters:  09/02/17 241 lb (109.3 kg)  06/03/17 253 lb 1.9 oz (114.8 kg)  05/18/17 250 lb (113.4 kg)     GEN:  Well nourished, well developed in no acute distress HEENT: Normal NECK: No JVD; No carotid bruits LYMPHATICS: No lymphadenopathy CARDIAC: RRR, no murmurs, rubs, gallops RESPIRATORY:  Clear to auscultation without rales, wheezing or rhonchi  ABDOMEN: Soft, non-tender, non-distended MUSCULOSKELETAL:  1+ edema; No deformity  SKIN: Warm and dry NEUROLOGIC:  Alert and oriented x 3 PSYCHIATRIC:  Normal affect    Signed, Shirlee More, MD  09/02/2017 1:46 PM    [facility]

## 2017-09-02 NOTE — Patient Instructions (Addendum)
Medication Instructions:  Your physician recommends that you continue on your current medications as directed. Please refer to the Current Medication list given to you today.  Labwork: Your physician recommends that you have the following labs drawn: BMP  Testing/Procedures: None  Follow-Up: Your physician recommends that you schedule a follow-up appointment in: 3 months  Any Other Special Instructions Will Be Listed Below (If Applicable).     If you need a refill on your cardiac medications before your next appointment, please call your pharmacy.   [place], RN, BSN   Heart Failure  Weigh yourself every morning when you first wake up and record on a calender or note pad, bring this to your office visits. Using a pill tender can help with taking your medications consistently.  Limit your fluid intake to 2 liters daily  Limit your sodium intake to less than 2-3 grams daily. Ask if you need dietary teaching.  If you gain more than 3 pounds (from your dry weight ), double your dose of diuretic for the day.  If you gain more than 5 pounds (from your dry weight), double your dose of lasix and call your heart failure doctor.  Please do not smoke tobacco since it is very bad for your heart.  Please do not drink alcohol since it can worsen your heart failure.Also avoid OTC nonsteroidal drugs, such as advil, aleve and motrin.  Try to exercise for at least 30 minutes every day because this will help your heart be more efficient. You may be eligible for supervised cardiac rehab, ask your physician.

## 2017-09-03 LAB — BASIC METABOLIC PANEL
BUN / CREAT RATIO: 14 (ref 10–24)
BUN: 21 mg/dL (ref 8–27)
CHLORIDE: 103 mmol/L (ref 96–106)
CO2: 26 mmol/L (ref 20–29)
Calcium: 9.7 mg/dL (ref 8.6–10.2)
Creatinine, Ser: 1.48 mg/dL — ABNORMAL HIGH (ref 0.76–1.27)
GFR calc non Af Amer: 46 mL/min/{1.73_m2} — ABNORMAL LOW (ref >59)
GFR, EST AFRICAN AMERICAN: 53 mL/min/{1.73_m2} — AB (ref >59)
Glucose: 120 mg/dL — ABNORMAL HIGH (ref 65–99)
POTASSIUM: 4.2 mmol/L (ref 3.5–5.2)
SODIUM: 144 mmol/L (ref 134–144)

## 2017-09-10 ENCOUNTER — Other Ambulatory Visit: Payer: Self-pay

## 2017-09-10 DIAGNOSIS — I110 Hypertensive heart disease with heart failure: Secondary | ICD-10-CM

## 2017-09-10 MED ORDER — TORSEMIDE 20 MG PO TABS: 20.0000 mg | ORAL_TABLET | Freq: Two times a day (BID) | ORAL | 3 refills | Status: DC

## 2017-09-29 DIAGNOSIS — B351 Tinea unguium: Secondary | ICD-10-CM | POA: Diagnosis not present

## 2017-09-29 DIAGNOSIS — E119 Type 2 diabetes mellitus without complications: Secondary | ICD-10-CM | POA: Diagnosis not present

## 2017-09-29 DIAGNOSIS — M79671 Pain in right foot: Secondary | ICD-10-CM | POA: Diagnosis not present

## 2017-09-29 DIAGNOSIS — M79672 Pain in left foot: Secondary | ICD-10-CM | POA: Diagnosis not present

## 2017-10-27 DIAGNOSIS — J209 Acute bronchitis, unspecified: Secondary | ICD-10-CM | POA: Diagnosis not present

## 2017-10-27 DIAGNOSIS — B370 Candidal stomatitis: Secondary | ICD-10-CM | POA: Diagnosis not present

## 2017-11-25 DIAGNOSIS — N183 Chronic kidney disease, stage 3 unspecified: Secondary | ICD-10-CM | POA: Insufficient documentation

## 2017-11-25 NOTE — Progress Notes (Deleted)
Cardiology Office Note:    Date:  11/25/2017   ID:  Thomas [name], DOB [birth year], MRN [id number]  PCP:  [name], MD  Cardiologist:  Shirlee More, MD    Referring MD: [name], MD    ASSESSMENT:    1. Chronic diastolic heart failure ([place])   2. Hypertensive heart disease with heart failure ([place])   3. Aortic stenosis, mild   4. CKD (chronic kidney disease) stage 3, GFR 30-59 ml/min ([facility])    PLAN:    In order of problems listed above:  1. ***   Next appointment: ***   Medication Adjustments/Labs and Tests Ordered: Current medicines are reviewed at length with the patient today.  Concerns regarding medicines are outlined above.  No orders of the defined types were placed in this encounter.  No orders of the defined types were placed in this encounter.   No chief complaint on file.   History of Present Illness:    Thomas [name] is a 72 y.o. male with a hx of diastolic HF EF 18%, hypertension, mild aortic stenosis last seen 09/02/17.  ASSESSMENT:    09/02/17   1. Chronic diastolic heart failure ([place])   2. Hypertensive heart disease with heart failure ([place])   3. Aortic stenosis, mild    PLAN:    1. Stable compensated continue his loop diuretic and sodium restriction 2. Stable continue current treatment 3. Mild and stable by physical examination  Compliance with diet, lifestyle and medications: *** Past Medical History:  Diagnosis Date  . Actinic keratosis 05/12/2017  . Back pain   . Depression 05/12/2017  . Diabetes ([place])   . GERD (gastroesophageal reflux disease) 05/12/2017  . Hx of colonic polyps 05/12/2017  . Hyperlipidemia 05/12/2017  . Hypertension   . 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 Low back pain 05/12/2017  . Lumbar radiculopathy 05/12/2017  . 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 Obesity 05/12/2017  . Osteoarthritis of knee 05/12/2017  . Paresthesia 05/12/2017  . 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 Prostate nodule 05/12/2017  . Seborrheic dermatitis  05/12/2017    Past Surgical History:  Procedure Laterality Date  . HAND SURGERY    . REPLACEMENT TOTAL KNEE Right   . SHOULDER SURGERY Right     Current Medications: No outpatient medications have been marked as taking for the 11/26/17 encounter (Appointment) with Richardo Priest, MD.     Allergies:   Wasp venom   Social History   Socioeconomic History  . Marital status: Married    Spouse name: Not on file  . Number of children: Not on file  . Years of education: Not on file  . Highest education level: Not on file  Occupational History  . Not on file  Social Needs  . Financial resource strain: Not on file  . Food insecurity:    Worry: Not on file    Inability: Not on file  . Transportation needs:    Medical: Not on file    Non-medical: Not on file  Tobacco Use  . Smoking status: Current Every Day Smoker    Packs/day: 1.00    Years: 55.00    Pack years: 55.00  . Smokeless tobacco: Never Used  Substance and Sexual Activity  . Alcohol use: No  . Drug use: No  . Sexual activity: Never    Birth control/protection: None  Lifestyle  . Physical activity:    Days per week: Not on file    Minutes per session: Not on file  .  Stress: Not on file  Relationships  . Social connections:    Talks on phone: Not on file    Gets together: Not on file    Attends religious service: Not on file    Active member of club or organization: Not on file    Attends meetings of clubs or organizations: Not on file    Relationship status: Not on file  Other Topics Concern  . Not on file  Social History Narrative  . Not on file     Family History: The patient's ***family history includes Alzheimer's disease in his father; Hypertension in his father and mother. ROS:   Please see the history of present illness.    All other systems reviewed and are negative.  EKGs/Labs/Other Studies Reviewed:    The following studies were reviewed today:  EKG:  EKG ordered today.  The ekg ordered  today demonstrates ***  Recent Labs: 05/14/2017: BNP 212.0 09/02/2017: BUN 21; Creatinine, Ser 1.48; Potassium 4.2; Sodium 144  Recent Lipid Panel No results found for: CHOL, TRIG, HDL, CHOLHDL, VLDL, LDLCALC, LDLDIRECT  Physical Exam:    VS:  There were no vitals taken for this visit.    Wt Readings from Last 3 Encounters:  09/02/17 241 lb (109.3 kg)  06/03/17 253 lb 1.9 oz (114.8 kg)  05/18/17 250 lb (113.4 kg)     GEN: *** Well nourished, well developed in no acute distress HEENT: Normal NECK: No JVD; No carotid bruits LYMPHATICS: No lymphadenopathy CARDIAC: ***RRR, no murmurs, rubs, gallops RESPIRATORY:  Clear to auscultation without rales, wheezing or rhonchi  ABDOMEN: Soft, non-tender, non-distended MUSCULOSKELETAL:  No edema; No deformity  SKIN: Warm and dry NEUROLOGIC:  Alert and oriented x 3 PSYCHIATRIC:  Normal affect    Signed, Shirlee More, MD  11/25/2017 5:55 PM    [place]

## 2017-11-26 ENCOUNTER — Ambulatory Visit: Payer: Medicare Other | Admitting: Cardiology

## 2017-11-26 DIAGNOSIS — R0989 Other specified symptoms and signs involving the circulatory and respiratory systems: Secondary | ICD-10-CM

## 2017-12-29 DIAGNOSIS — M79672 Pain in left foot: Secondary | ICD-10-CM | POA: Diagnosis not present

## 2017-12-29 DIAGNOSIS — B351 Tinea unguium: Secondary | ICD-10-CM | POA: Diagnosis not present

## 2017-12-29 DIAGNOSIS — M79671 Pain in right foot: Secondary | ICD-10-CM | POA: Diagnosis not present

## 2017-12-29 DIAGNOSIS — E119 Type 2 diabetes mellitus without complications: Secondary | ICD-10-CM | POA: Diagnosis not present

## 2018-01-03 ENCOUNTER — Emergency Department (HOSPITAL_COMMUNITY): Payer: Medicare Other

## 2018-01-03 ENCOUNTER — Encounter (HOSPITAL_COMMUNITY): Payer: Self-pay | Admitting: Emergency Medicine

## 2018-01-03 ENCOUNTER — Emergency Department (HOSPITAL_COMMUNITY)
Admission: EM | Admit: 2018-01-03 | Discharge: 2018-01-03 | Disposition: A | Discharge status: Home / Self Care | Payer: Medicare Other | Attending: Emergency Medicine | Admitting: Emergency Medicine

## 2018-01-03 DIAGNOSIS — I10 Essential (primary) hypertension: Secondary | ICD-10-CM | POA: Diagnosis not present

## 2018-01-03 DIAGNOSIS — F17210 Nicotine dependence, cigarettes, uncomplicated: Secondary | ICD-10-CM | POA: Insufficient documentation

## 2018-01-03 DIAGNOSIS — Z7984 Long term (current) use of oral hypoglycemic drugs: Secondary | ICD-10-CM | POA: Diagnosis not present

## 2018-01-03 DIAGNOSIS — R0789 Other chest pain: Secondary | ICD-10-CM | POA: Insufficient documentation

## 2018-01-03 DIAGNOSIS — N183 Chronic kidney disease, stage 3 (moderate): Secondary | ICD-10-CM | POA: Insufficient documentation

## 2018-01-03 DIAGNOSIS — Z79899 Other long term (current) drug therapy: Secondary | ICD-10-CM | POA: Diagnosis not present

## 2018-01-03 DIAGNOSIS — E119 Type 2 diabetes mellitus without complications: Secondary | ICD-10-CM | POA: Diagnosis not present

## 2018-01-03 DIAGNOSIS — I5032 Chronic diastolic (congestive) heart failure: Secondary | ICD-10-CM | POA: Insufficient documentation

## 2018-01-03 DIAGNOSIS — I130 Hypertensive heart and chronic kidney disease with heart failure and stage 1 through stage 4 chronic kidney disease, or unspecified chronic kidney disease: Secondary | ICD-10-CM | POA: Insufficient documentation

## 2018-01-03 DIAGNOSIS — R079 Chest pain, unspecified: Secondary | ICD-10-CM

## 2018-01-03 DIAGNOSIS — Z7982 Long term (current) use of aspirin: Secondary | ICD-10-CM | POA: Insufficient documentation

## 2018-01-03 LAB — BASIC METABOLIC PANEL
Anion gap: 11 (ref 5–15)
BUN: 19 mg/dL (ref 6–20)
CHLORIDE: 103 mmol/L (ref 101–111)
CO2: 27 mmol/L (ref 22–32)
CREATININE: 1.2 mg/dL (ref 0.61–1.24)
Calcium: 8.9 mg/dL (ref 8.9–10.3)
GFR calc non Af Amer: 58 mL/min — ABNORMAL LOW (ref >60)
Glucose, Bld: 151 mg/dL — ABNORMAL HIGH (ref 65–99)
POTASSIUM: 3.7 mmol/L (ref 3.5–5.1)
SODIUM: 141 mmol/L (ref 135–145)

## 2018-01-03 LAB — I-STAT TROPONIN, ED
TROPONIN I, POC: 0.01 ng/mL (ref 0.00–0.08)
Troponin i, poc: 0.02 ng/mL (ref 0.00–0.08)

## 2018-01-03 LAB — CBC
HEMATOCRIT: 43.2 % (ref 39.0–52.0)
Hemoglobin: 14.8 g/dL (ref 13.0–17.0)
MCH: 31.5 pg (ref 26.0–34.0)
MCHC: 34.3 g/dL (ref 30.0–36.0)
MCV: 91.9 fL (ref 78.0–100.0)
Platelets: 231 10*3/uL (ref 150–400)
RBC: 4.7 MIL/uL (ref 4.22–5.81)
RDW: 13.3 % (ref 11.5–15.5)
WBC: 8.7 10*3/uL (ref 4.0–10.5)

## 2018-01-03 MED ORDER — IOPAMIDOL (ISOVUE-370) INJECTION 76%
INTRAVENOUS | Status: AC
  Administered 2018-01-03: 17:00:00
  Filled 2018-01-03: qty 100

## 2018-01-03 MED ORDER — IOPAMIDOL (ISOVUE-370) INJECTION 76%: 100.0000 mL | Freq: Once | INTRAVENOUS | Status: DC | PRN

## 2018-01-03 MED ORDER — ONDANSETRON HCL 4 MG/2ML IJ SOLN
4.0000 mg | Freq: Once | INTRAMUSCULAR | Status: AC
  Administered 2018-01-03: 4 mg via INTRAVENOUS
  Filled 2018-01-03: qty 2

## 2018-01-03 MED ORDER — MORPHINE SULFATE (PF) 4 MG/ML IV SOLN
4.0000 mg | INTRAVENOUS | Status: DC | PRN
  Administered 2018-01-03: 4 mg via INTRAVENOUS
  Filled 2018-01-03: qty 1

## 2018-01-03 NOTE — ED Triage Notes (Signed)
Pt reports central dull cp that radiates around to his back x4 weeks, states he let his doctor at the [facility] know about it but all they did was blood work which they told him was normal. Pt denies, nad.

## 2018-01-03 NOTE — ED Notes (Signed)
Dr Jeneen Rinks aware of pt pain medication concerns.

## 2018-01-03 NOTE — ED Notes (Signed)
Pt in CT at this time.

## 2018-01-03 NOTE — ED Notes (Signed)
CT notified pt ready for scan

## 2018-01-03 NOTE — ED Notes (Signed)
EDP notified that pt. requested to speak with him , nurse explained delay /process and pending CT scan result , pt. is upset due to long wait and wanted to leave .

## 2018-01-03 NOTE — ED Notes (Signed)
EKG done in triage, hardcopy available. Did not transmit in MUSE

## 2018-01-03 NOTE — ED Notes (Signed)
Pt expressed concern at receiving IV morphine and asking if he could potentially try something different for his pain after his CT. Pt states "I think I'm okay for right now, but I don't want to get in trouble with the [facility] for the pain medicine." Explained to pt medication ordered is appropriate for pt current pain and indicated in his care. Will speak with EDP regarding pain medication.

## 2018-01-03 NOTE — ED Notes (Signed)
ED Provider at bedside.

## 2018-01-03 NOTE — ED Notes (Signed)
EDP explained delay and plan of care to pt.

## 2018-01-03 NOTE — Discharge Instructions (Addendum)
You are leaving the emergency room before completion of your evaluation and against the medical advice of Dr. Jeneen Rinks.  You can have your primary care physician check into the results of your testing tonight, or return to medical records tomorrow.  You may also obtain your records by using "my chart" instructions are in your discharge instructions

## 2018-01-04 ENCOUNTER — Telehealth: Payer: Self-pay | Admitting: Cardiology

## 2018-01-04 NOTE — Telephone Encounter (Signed)
Appointment made for 01/07/18 at 9:40 am.

## 2018-01-04 NOTE — Telephone Encounter (Signed)
Should fu in office

## 2018-01-04 NOTE — Telephone Encounter (Signed)
Patient went to ER at [facility] yesterday and he is wanting Dr Bettina Gavia to look over records and see if he needs to be concerned or possibly come into the office. Patient had CT and all kinds of labs but felt like the ED visit was a waste.

## 2018-01-04 NOTE — Telephone Encounter (Signed)
Please advise.

## 2018-01-07 ENCOUNTER — Ambulatory Visit (INDEPENDENT_AMBULATORY_CARE_PROVIDER_SITE_OTHER): Payer: Medicare Other | Admitting: Cardiology

## 2018-01-07 ENCOUNTER — Emergency Department (HOSPITAL_BASED_OUTPATIENT_CLINIC_OR_DEPARTMENT_OTHER): Payer: Medicare Other

## 2018-01-07 ENCOUNTER — Other Ambulatory Visit: Payer: Self-pay

## 2018-01-07 ENCOUNTER — Encounter (HOSPITAL_BASED_OUTPATIENT_CLINIC_OR_DEPARTMENT_OTHER): Payer: Self-pay | Admitting: Emergency Medicine

## 2018-01-07 ENCOUNTER — Emergency Department (HOSPITAL_BASED_OUTPATIENT_CLINIC_OR_DEPARTMENT_OTHER)
Admission: EM | Admit: 2018-01-07 | Discharge: 2018-01-07 | Disposition: A | Discharge status: Home / Self Care | Payer: Medicare Other | Attending: Emergency Medicine | Admitting: Emergency Medicine

## 2018-01-07 ENCOUNTER — Encounter: Payer: Self-pay | Admitting: Cardiology

## 2018-01-07 VITALS — BP 152/86 | HR 64 | Ht 71.0 in | Wt 245.0 lb

## 2018-01-07 DIAGNOSIS — I2510 Atherosclerotic heart disease of native coronary artery without angina pectoris: Secondary | ICD-10-CM

## 2018-01-07 DIAGNOSIS — I350 Nonrheumatic aortic (valve) stenosis: Secondary | ICD-10-CM

## 2018-01-07 DIAGNOSIS — J9811 Atelectasis: Secondary | ICD-10-CM | POA: Diagnosis not present

## 2018-01-07 DIAGNOSIS — F17210 Nicotine dependence, cigarettes, uncomplicated: Secondary | ICD-10-CM | POA: Diagnosis not present

## 2018-01-07 DIAGNOSIS — R0789 Other chest pain: Secondary | ICD-10-CM | POA: Diagnosis not present

## 2018-01-07 DIAGNOSIS — Z7984 Long term (current) use of oral hypoglycemic drugs: Secondary | ICD-10-CM | POA: Insufficient documentation

## 2018-01-07 DIAGNOSIS — I130 Hypertensive heart and chronic kidney disease with heart failure and stage 1 through stage 4 chronic kidney disease, or unspecified chronic kidney disease: Secondary | ICD-10-CM | POA: Diagnosis not present

## 2018-01-07 DIAGNOSIS — I110 Hypertensive heart disease with heart failure: Secondary | ICD-10-CM

## 2018-01-07 DIAGNOSIS — E1122 Type 2 diabetes mellitus with diabetic chronic kidney disease: Secondary | ICD-10-CM | POA: Diagnosis not present

## 2018-01-07 DIAGNOSIS — R079 Chest pain, unspecified: Secondary | ICD-10-CM | POA: Insufficient documentation

## 2018-01-07 DIAGNOSIS — Z7982 Long term (current) use of aspirin: Secondary | ICD-10-CM | POA: Insufficient documentation

## 2018-01-07 DIAGNOSIS — Z79899 Other long term (current) drug therapy: Secondary | ICD-10-CM | POA: Diagnosis not present

## 2018-01-07 DIAGNOSIS — I5032 Chronic diastolic (congestive) heart failure: Secondary | ICD-10-CM

## 2018-01-07 DIAGNOSIS — R1011 Right upper quadrant pain: Secondary | ICD-10-CM | POA: Insufficient documentation

## 2018-01-07 DIAGNOSIS — Z5320 Procedure and treatment not carried out because of patient's decision for unspecified reasons: Secondary | ICD-10-CM | POA: Insufficient documentation

## 2018-01-07 DIAGNOSIS — R072 Precordial pain: Secondary | ICD-10-CM | POA: Diagnosis not present

## 2018-01-07 DIAGNOSIS — I2584 Coronary atherosclerosis due to calcified coronary lesion: Secondary | ICD-10-CM

## 2018-01-07 DIAGNOSIS — N183 Chronic kidney disease, stage 3 (moderate): Secondary | ICD-10-CM | POA: Diagnosis not present

## 2018-01-07 LAB — CBC WITH DIFFERENTIAL/PLATELET
BASOS PCT: 0 %
Basophils Absolute: 0 10*3/uL (ref 0.0–0.1)
EOS ABS: 0.1 10*3/uL (ref 0.0–0.7)
EOS PCT: 1 %
HCT: 42.2 % (ref 39.0–52.0)
HEMOGLOBIN: 14.7 g/dL (ref 13.0–17.0)
LYMPHS ABS: 1.5 10*3/uL (ref 0.7–4.0)
Lymphocytes Relative: 16 %
MCH: 31.7 pg (ref 26.0–34.0)
MCHC: 34.8 g/dL (ref 30.0–36.0)
MCV: 90.9 fL (ref 78.0–100.0)
MONOS PCT: 6 %
Monocytes Absolute: 0.5 10*3/uL (ref 0.1–1.0)
NEUTROS PCT: 77 %
Neutro Abs: 7.2 10*3/uL (ref 1.7–7.7)
PLATELETS: 248 10*3/uL (ref 150–400)
RBC: 4.64 MIL/uL (ref 4.22–5.81)
RDW: 13.4 % (ref 11.5–15.5)
WBC: 9.3 10*3/uL (ref 4.0–10.5)

## 2018-01-07 LAB — COMPREHENSIVE METABOLIC PANEL
ALBUMIN: 3.8 g/dL (ref 3.5–5.0)
ALT: 15 U/L — ABNORMAL LOW (ref 17–63)
ANION GAP: 9 (ref 5–15)
AST: 18 U/L (ref 15–41)
Alkaline Phosphatase: 77 U/L (ref 38–126)
BUN: 22 mg/dL — ABNORMAL HIGH (ref 6–20)
CHLORIDE: 106 mmol/L (ref 101–111)
CO2: 24 mmol/L (ref 22–32)
Calcium: 8.8 mg/dL — ABNORMAL LOW (ref 8.9–10.3)
Creatinine, Ser: 0.98 mg/dL (ref 0.61–1.24)
GFR calc non Af Amer: >60 mL/min (ref >60)
GLUCOSE: 130 mg/dL — AB (ref 65–99)
Potassium: 4.1 mmol/L (ref 3.5–5.1)
SODIUM: 139 mmol/L (ref 135–145)
Total Bilirubin: 0.6 mg/dL (ref 0.3–1.2)
Total Protein: 6.5 g/dL (ref 6.5–8.1)

## 2018-01-07 LAB — LIPASE, BLOOD: Lipase: 22 U/L (ref 11–51)

## 2018-01-07 LAB — TROPONIN I: Troponin I: <0.03 ng/mL (ref <0.03)

## 2018-01-07 NOTE — ED Notes (Signed)
ED Provider at bedside discussing plan of care.

## 2018-01-07 NOTE — Progress Notes (Signed)
Cardiology Office Note:    Date:  01/07/2018   ID:  Thomas [name], DOB 01/31/1946, MRN [id number]  PCP:  Thomas Pelton, MD  Cardiologist:  Thomas More, MD    Referring MD: Thomas Pelton, MD    ASSESSMENT:    1. Precordial pain   2. Aortic stenosis, mild   3. Chronic diastolic heart failure ([place])   4. Hypertensive heart disease with heart failure ([place])   5. Coronary artery calcification    PLAN:    In order of problems listed above:  1. His symptoms are atypical but very high risk with heart failure dense coronary calcification ongoing unexplained chest pain.  Reviewed with the patient his wife he may well best be served by coronary angiography may require admission to the hospital. 2. Stable 3. Stable nicely compensated on low-dose loop diuretic 4. Stable   Next appointment: As needed   Medication Adjustments/Labs and Tests Ordered: Current medicines are reviewed at length with the patient today.  Concerns regarding medicines are outlined above.  Orders Placed This Encounter  Procedures  . EKG 12-Lead   No orders of the defined types were placed in this encounter.   Chief Complaint  Patient presents with  . Follow-up    History of Present Illness:    Thomas [name] is a 72 y.o. male with a hx of diastolic HFEF 40%, hypertension, mild aortic stenosis last seen 09/02/17. He was recently seen at [facility] ED for chest pain. EKG and 2 troponin were normal.He had a non ischemic MPI in September 2018.  Chest CTA: IMPRESSION: 1. No evidence for dissection, aneurysm, vasculitis, or significant stenosis of the aorta or great vessels. 2. Small pericardial effusion. 3. Severe coronary and mild-to-moderate aortic calcific atherosclerosis. 4. Small hiatal hernia. 5. Mild sigmoid diverticulosis, no findings of acute diverticulitis.  ED triage note:  01/03/18 Pt reports central dull cp that radiates around to his back x4 weeks, states he let his doctor at the [place] know about  it but all they did was blood work which they told him was normal. Pt denies, nad.   Compliance with diet, lifestyle and medications: yes  He has been having pain for several weeks intermittently persistently lasts for hours at a time he describes it as epigastric off and into the right chest through to the back but intermittently is in the center of his chest and today is having severe substernal chest pain.  He recently left the emergency room before finishing his evaluation into normal troponin and CT of the chest shows dense coronary calcification.  He had a myocardial perfusion study in September of last year that was nonischemic.  He certainly is at increased cardiovascular risk I think the most appropriate course appears him to go back to the emergency room to have troponin assessed and would be best served by admission to the hospital and consideration of coronary angiography.  He has a chronic pain syndrome taken analgesics but is for low back lumbar pain in his discomfort is not related to the rash.  Is no associated GI symptoms or shortness of breath.  His congestive heart failure is compensators hypertension is stable and CT of the chest showed no evidence of aortic dissection. Past Medical History:  Diagnosis Date  . Actinic keratosis 05/12/2017  . Back pain   . Depression 05/12/2017  . Diabetes ([place])   . GERD (gastroesophageal reflux disease) 05/12/2017  . Hx of colonic polyps 05/12/2017  . Hyperlipidemia 05/12/2017  . Hypertension   .  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 Low back pain 05/12/2017  . Lumbar radiculopathy 05/12/2017  . 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 Obesity 05/12/2017  . Osteoarthritis of knee 05/12/2017  . Paresthesia 05/12/2017  . 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 Prostate nodule 05/12/2017  . Seborrheic dermatitis 05/12/2017    Past Surgical History:  Procedure Laterality Date  . HAND SURGERY    . REPLACEMENT TOTAL KNEE Right   . SHOULDER SURGERY Right     Current  Medications: Current Meds  Medication Sig  . aspirin 81 MG chewable tablet Chew 81 mg by mouth daily.  [name] atorvastatin (LIPITOR) 80 MG tablet Take 80 mg by mouth daily.  . DULoxetine (CYMBALTA) 20 MG capsule Take 20 mg by mouth daily.  [name] EPINEPHrine 0.3 mg/0.3 mL IJ SOAJ injection Inject 0.3 mg into the muscle once.  [name] glipiZIDE (GLUCOTROL) 5 MG tablet Take 2.5 mg by mouth 2 (two) times daily before a meal.  . loratadine (CLARITIN) 10 MG tablet Take 10 mg by mouth daily.  [name] losartan (COZAAR) 25 MG tablet Take 25 mg by mouth daily.  . Meclizine HCl 25 MG CHEW Chew 1 tablet by mouth 2 (two) times daily as needed (dizziness).  . metFORMIN (GLUCOPHAGE) 1000 MG tablet Take 1,000 mg by mouth 2 (two) times daily with a meal.  . methadone (DOLOPHINE) 5 MG tablet Take 5 mg by mouth daily.  [name] oxyCODONE-acetaminophen (PERCOCET/ROXICET) 5-325 MG tablet Take 1 tablet by mouth 5 (five) times daily as needed for severe pain.   . salsalate (DISALCID) 750 MG tablet Take 1,500 mg by mouth 2 (two) times daily.  [name] terazosin (HYTRIN) 2 MG capsule Take 2 mg by mouth at bedtime.  . torsemide (DEMADEX) 20 MG tablet Take 20 mg by mouth daily as needed. Takes 3 times per week  . [DISCONTINUED] torsemide (DEMADEX) 20 MG tablet Take 1 tablet (20 mg total) by mouth 2 (two) times daily.     Allergies:   Wasp venom   Social History   Socioeconomic History  . Marital status: Married    Spouse name: Not on file  . Number of children: Not on file  . Years of education: Not on file  . Highest education level: Not on file  Occupational History  . Not on file  Social Needs  . Financial resource strain: Not on file  . Food insecurity:    Worry: Not on file    Inability: Not on file  . Transportation needs:    Medical: Not on file    Non-medical: Not on file  Tobacco Use  . Smoking status: Current Every Day Smoker    Packs/day: 1.00    Years: 55.00    Pack years: 55.00  . Smokeless tobacco: Never Used    Substance and Sexual Activity  . Alcohol use: No  . Drug use: No  . Sexual activity: Never    Birth control/protection: None  Lifestyle  . Physical activity:    Days per week: Not on file    Minutes per session: Not on file  . Stress: Not on file  Relationships  . Social connections:    Talks on phone: Not on file    Gets together: Not on file    Attends religious service: Not on file    Active member of club or organization: Not on file    Attends meetings of clubs or organizations: Not on file    Relationship status: Not on file  Other Topics Concern  .  Not on file  Social History Narrative  . Not on file     Family History: The patient's family history includes Alzheimer's disease in his father; Hypertension in his father and mother. ROS:   Please see the history of present illness.    All other systems reviewed and are negative.  EKGs/Labs/Other Studies Reviewed:    The following studies were reviewed today:  EKG:  EKG ordered today.  The ekg ordered today demonstrates this rhythm LVH and ST-T abnormality not significantly changed from previous  Recent Labs: 05/14/2017: BNP 212.0 01/07/2018: ALT 15; BUN 22; Creatinine, Ser 0.98; Hemoglobin 14.7; Platelets 248; Potassium 4.1; Sodium 139  Recent Lipid Panel No results found for: CHOL, TRIG, HDL, CHOLHDL, VLDL, LDLCALC, LDLDIRECT  Physical Exam:    VS:  BP (!) 152/86 (BP Location: Right Arm, Patient Position: Sitting, Cuff Size: Large)   Pulse 64   Ht _0  (1.803 m)   Wt 245 lb (111.1 kg)   SpO2 97%   BMI 34.17 kg/m     Wt Readings from Last 3 Encounters:  01/07/18 245 lb (111.1 kg)  01/07/18 245 lb (111.1 kg)  09/02/17 241 lb (109.3 kg)     GEN:  Well nourished, well developed in no acute distress HEENT: Normal NECK: No JVD; No carotid bruits LYMPHATICS: No lymphadenopathy CARDIAC: RRR, no murmurs, rubs, gallops RESPIRATORY:  Clear to auscultation without rales, wheezing or rhonchi  ABDOMEN: Soft,  non-tender, non-distended MUSCULOSKELETAL:  No edema; No deformity  SKIN: Warm and dry NEUROLOGIC:  Alert and oriented x 3 PSYCHIATRIC:  Normal affect    Signed, Thomas More, MD  01/07/2018 12:39 PM    [facility]

## 2018-01-07 NOTE — ED Notes (Signed)
ED Provider at bedside.

## 2018-01-07 NOTE — ED Triage Notes (Signed)
Pain to right of low sternum radiating to right back x3-4 weeks. Denies sob. No n/v.

## 2018-01-07 NOTE — ED Provider Notes (Signed)
[place] EMERGENCY DEPARTMENT Provider Note   CSN: [id number] Arrival date & time: 01/07/18  1033     History   Chief Complaint Chief Complaint  Patient presents with  . Chest Pain    HPI Thomas [name] is a 72 y.o. male past medical history of diabetes, GERD, hypertension, aortic stenosis who presents for evaluation of chest pain that is been ongoing for the last 3 to 4 weeks.  Patient reports that pain is on the right side and radiates to the back.  He states that sometimes will be in the right upper quadrant region.  Patient states that the pain has been constant.  He states it is better when he is at rest but he still has the pain consistently.  He states it is not worsened with exertion or with deep inspiration but does feel like he has no energy to complete his normal activities.  Patient states that he has not taken anything for the pain.  Patient states he has not had any associated nausea, vomiting, diaphoresis with the pain.  Patient states that he went to his cardiologist today for evaluation of pain and was sent to the ED for further evaluation.  Patient reports that he does smoke half pack of cigarettes a day.  He does have a history of hypertension, diabetes, aortic stenosis.  He denies any family cardiac history.  Patient denies any fevers.   The history is provided by the patient.    Past Medical History:  Diagnosis Date  . Actinic keratosis 05/12/2017  . Back pain   . Depression 05/12/2017  . Diabetes ([place])   . GERD (gastroesophageal reflux disease) 05/12/2017  . Hx of colonic polyps 05/12/2017  . Hyperlipidemia 05/12/2017  . Hypertension   . 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 Low back pain 05/12/2017  . Lumbar radiculopathy 05/12/2017  . 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 Obesity 05/12/2017  . Osteoarthritis of knee 05/12/2017  . Paresthesia 05/12/2017  . 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 Prostate nodule 05/12/2017  . Seborrheic dermatitis 05/12/2017    Patient Active  Problem List   Diagnosis Date Noted  . Chest pain 01/07/2018  . Coronary artery calcification 01/07/2018  . CKD (chronic kidney disease) stage 3, GFR 30-59 ml/min (HCC) 11/25/2017  . Chronic diastolic heart failure ([place]) 05/13/2017  . Aortic stenosis, mild 05/13/2017  . Low back pain 05/12/2017  . GERD (gastroesophageal reflux disease) 05/12/2017  . Prostate nodule 05/12/2017  . Obesity 05/12/2017  . Depression 05/12/2017  . Hx of colonic polyps 05/12/2017  . 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 Type 2 diabetes mellitus ([place]) 05/12/2017  . Hyperlipidemia 05/12/2017  . Hypertensive heart disease with heart failure ([place]) 05/12/2017  . Osteoarthritis of knee 05/12/2017  . Sleep apnea 05/12/2017  . Actinic keratosis 05/12/2017  . 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 Seborrheic dermatitis 05/12/2017  . Lumbar radiculopathy 05/12/2017  . Paresthesia 05/12/2017    Past Surgical History:  Procedure Laterality Date  . HAND SURGERY    . REPLACEMENT TOTAL KNEE Right   . SHOULDER SURGERY Right         Home Medications    Prior to Admission medications   Medication Sig Start Date End Date Taking? Authorizing Provider  aspirin 81 MG chewable tablet Chew 81 mg by mouth daily.    [provider]  atorvastatin (LIPITOR) 80 MG tablet Take 80 mg by mouth daily.    [provider]  DULoxetine (CYMBALTA)  20 MG capsule Take 20 mg by mouth daily.    [provider]  EPINEPHrine 0.3 mg/0.3 mL IJ SOAJ injection Inject 0.3 mg into the muscle once.    [provider]  glipiZIDE (GLUCOTROL) 5 MG tablet Take 2.5 mg by mouth 2 (two) times daily before a meal.    [provider]  loratadine (CLARITIN) 10 MG tablet Take 10 mg by mouth daily.    [provider]  losartan (COZAAR) 25 MG tablet Take 25 mg by mouth daily.    [provider]  Meclizine HCl 25 MG CHEW Chew 1 tablet by mouth 2 (two) times daily  as needed (dizziness).    [provider]  metFORMIN (GLUCOPHAGE) 1000 MG tablet Take 1,000 mg by mouth 2 (two) times daily with a meal.    [provider]  methadone (DOLOPHINE) 5 MG tablet Take 5 mg by mouth daily.    [provider]  oxyCODONE-acetaminophen (PERCOCET/ROXICET) 5-325 MG tablet Take 1 tablet by mouth 5 (five) times daily as needed for severe pain.     [provider]  salsalate (DISALCID) 750 MG tablet Take 1,500 mg by mouth 2 (two) times daily.    [provider]  sildenafil (VIAGRA) 50 MG tablet Take 25 mg by mouth daily as needed for erectile dysfunction.    [provider]  terazosin (HYTRIN) 2 MG capsule Take 2 mg by mouth at bedtime.    [provider]  torsemide (DEMADEX) 20 MG tablet Take 20 mg by mouth daily as needed. Takes 3 times per week    [provider]    Family History Family History  Problem Relation Age of Onset  . Hypertension Mother   . Hypertension Father   . Alzheimer's disease Father     Social History Social History   Tobacco Use  . Smoking status: Current Every Day Smoker    Packs/day: 1.00    Years: 55.00    Pack years: 55.00  . Smokeless tobacco: Never Used  Substance Use Topics  . Alcohol use: No  . Drug use: No     Allergies   Wasp venom   Review of Systems Review of Systems  Constitutional: Negative for chills and fever.  HENT: Negative for congestion.   Eyes: Negative for visual disturbance.  Respiratory: Negative for cough and shortness of breath.   Cardiovascular: Positive for chest pain.  Gastrointestinal: Negative for abdominal pain, diarrhea, nausea and vomiting.  Genitourinary: Negative for dysuria and hematuria.  Musculoskeletal: Negative for back pain.  Skin: Negative for rash.  Neurological: Negative for dizziness, weakness, numbness and headaches.  All other systems reviewed and are negative.    Physical Exam Updated Vital Signs BP  (!) 172/79 (BP Location: Left Arm)   Pulse 64   Temp 98.4 F (36.9 C) (Oral)   Resp 16   Ht _0  (1.803 m)   Wt 111.1 kg (245 lb)   SpO2 99%   BMI 34.17 kg/m   Physical Exam  Constitutional: He is oriented to person, place, and time. He appears well-developed and well-nourished.  HENT:  Head: Normocephalic and atraumatic.  Mouth/Throat: Oropharynx is clear and moist and mucous membranes are normal.  Eyes: Pupils are equal, round, and reactive to light. Conjunctivae, EOM and lids are normal.  Neck: Full passive range of motion without pain.  Cardiovascular: Normal rate, regular rhythm, normal heart sounds and normal pulses. Exam reveals no gallop and no friction rub.  No murmur  heard. Pulses:      Radial pulses are 2+ on the right side, and 2+ on the left side.  Pulmonary/Chest: Effort normal and breath sounds normal.  Lungs clear to auscultation bilaterally.  Symmetric chest rise.  No wheezing, rales, rhonchi.  No tenderness palpation noted to anterior chest wall.  No deformity or crepitus noted.  Abdominal: Soft. Normal appearance. There is tenderness in the right upper quadrant. There is no rigidity and no guarding.  Musculoskeletal: Normal range of motion.  Neurological: He is alert and oriented to person, place, and time.  Skin: Skin is warm and dry. Capillary refill takes less than 2 seconds.  Psychiatric: He has a normal mood and affect. His speech is normal.  Nursing note and vitals reviewed.    ED Treatments / Results  Labs (all labs ordered are listed, but only abnormal results are displayed) Labs Reviewed  COMPREHENSIVE METABOLIC PANEL - Abnormal; Notable for the following components:      Result Value   Glucose, Bld 130 (*)    BUN 22 (*)    Calcium 8.8 (*)    ALT 15 (*)    All other components within normal limits  TROPONIN I  CBC WITH DIFFERENTIAL/PLATELET  LIPASE, BLOOD    EKG EKG Interpretation  Date/Time:  Friday Jan 07 2018 10:40:21  EDT Ventricular Rate:  62 PR Interval:    QRS Duration: 102 QT Interval:  420 QTC Calculation: 427 R Axis:   -52 Text Interpretation:  Sinus rhythm LAD, consider left anterior fascicular block Abnormal T, consider ischemia, lateral leads No STEMI.  Confirmed by Nanda Quinton [phone]) on 01/07/2018 10:44:14 AM   Radiology Dg Chest 2 View  Result Date: 01/07/2018 CLINICAL DATA:  Pain adjacent sternum. Radiation to posteriorly to the right. EXAM: CHEST - 2 VIEW COMPARISON:  CT 01/03/2018. FINDINGS: Mediastinum and hilar structures normal. Cardiomegaly with normal pulmonary vascularity. Low lung volumes with mild basilar atelectasis. No focal alveolar infiltrate. No pleural effusion or pneumothorax. No acute bony abnormality. Degenerative change thoracic spine. IMPRESSION: 1. Low lung volumes with mild basilar atelectasis. No focal alveolar infiltrate. 2.  Cardiomegaly no pulmonary venous congestion. 3. No acute bony abnormality. Degenerative changes thoracic spine. Sternal region appears unremarkable. Electronically Signed   By: Marcello Moores  Register   On: 01/07/2018 11:17    Procedures Procedures (including critical care time)  Medications Ordered in ED Medications - No data to display   Initial Impression / Assessment and Plan / ED Course  I have reviewed the triage vital signs and the nursing notes.  Pertinent labs & imaging results that were available during my care of the patient were reviewed by me and considered in my medical decision making (see chart for details).     72 year old male who presents for evaluation of 3 to 4 weeks of right-sided chest pain.  Reports it is not worse with exertion or deep inspiration but does state that he has not been able to do his normal activity secondary to the pain.  No associated shortness of breath, nausea, vomiting.  Patient does have a history of aortic stenosis, diabetes, hypertension.  Was seen by his cardiologist today (Dr. Bettina Gavia) and was prompted  to go to the ED for further evaluation.  Patient is afebrile, non-toxic appearing, sitting comfortably on examination table. Vital signs reviewed.  It is slightly hypertensive.  Consider ACS etiology versus acute infectious etiology.  Not suspect PE or dissection given history/physical exam. Patient had a CTA of chest done on  01/04/2018 that was negative for aortic dissection.  And to check basic labs, EKG, chest x-ray.  Troponin negative.  Lipase unremarkable.  CBC without any significant leukocytosis, anemia.  CMP shows slight elevation in BUN but otherwise unremarkable.  X-ray shows cardiomegaly but no congestion.  Given patient's history/physical exam and risk factors, he has a HEART score of 6. Dr. Bettina Gavia (Cardiology) had initially recommended that patient get admitted for catheterization.  Given patient's risk factors and presentation, I feel that getting admitted for heart catheterization is the best course of action.  Discussed with Dr. Laverta Baltimore who is agreeable to plan.  Updated patient on results.  Discussed with him regarding plan for admission with potential catheterization.  Patient does not wish to be admitted.  He states that he does not want to go to the hospital and would rather go home and "enjoyed his weekend."  I discussed with him the risk first benefits of leaving and stated that given his history/physical, if he chose to leave it would be [place].  Patient would like me to consult cardiology to find a specific timeline of when he would get catheterization.  Discussed patient with Dr. Vernelle Emerald (Cardiology).  Agrees with plan for catheterization.  Recommend hospital admission with plans to transfer to [facility] facilities for catheterization later today or Monday.  Consult to hospitalist.  Discussed plan with patient.  Patient does not wish to be admitted.  He states that he does not want to go be transferred to [facility] and does not wish to get the catheterization done today.  I  discussed with him at length regarding the risk first benefits of declining admission, including but not limited to worsening condition, death.  Patient expresses full understanding of risk first benefits and wishes declined at this time.  Patient exhibits full medical decision-making capacity.  I informed patient that if he chooses to leave, he will be [place].  Patient understands and wishes to leave at this time.  Discussed with Dr. Bettina Gavia (cardiology).  He is updated on patient's decision to leave AMA.  Recommends follow-up and outpatient.  I discussed with patient.  Instructed patient that if at any time, he feels returning pain, worsening conditions, he can return to the emergency department immediately.  Encouraged him to follow-up with Dr. Joya Gaskins office. Patient had ample opportunity for questions and discussion. All patient's questions were answered with full understanding.  Patient discharged [place].  Final Clinical Impressions(s) / ED Diagnoses   Final diagnoses:  Chest pain, unspecified type    ED Discharge Orders    None       Volanda Napoleon, PA-C 01/07/18 1254    Long, Wonda [facility], MD 01/08/18 [phone]

## 2018-01-07 NOTE — ED Notes (Signed)
ED Provider at bedside. 

## 2018-01-07 NOTE — ED Notes (Signed)
ED Provider at bedside discussing her consult with cardiology.

## 2018-01-08 NOTE — ED Provider Notes (Signed)
[facility] Provider Note   CSN: [id number] Arrival date & time: 01/03/18  4401     History   Chief Complaint Chief Complaint  Patient presents with  . Chest Pain    HPI Thomas [name] is a 72 y.o. male.  Complaint is right-sided chest pain.  HPI 72 year old male.  He describes chest pain from his xiphoid to the right lateral chest for the last 3 to 4 weeks.  States he told his physicians about it at the [place] but they "ignored it".  States that his sharp.  This seemed to radiate around to his back.  No left-sided pain.  No dyspnea.  It is not exertional.  Hurts to move.  Past Medical History:  Diagnosis Date  . Actinic keratosis 05/12/2017  . Back pain   . Depression 05/12/2017  . Diabetes ([place])   . GERD (gastroesophageal reflux disease) 05/12/2017  . Hx of colonic polyps 05/12/2017  . Hyperlipidemia 05/12/2017  . Hypertension   . 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 Low back pain 05/12/2017  . Lumbar radiculopathy 05/12/2017  . 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 Obesity 05/12/2017  . Osteoarthritis of knee 05/12/2017  . Paresthesia 05/12/2017  . 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 Prostate nodule 05/12/2017  . Seborrheic dermatitis 05/12/2017    Patient Active Problem List   Diagnosis Date Noted  . Chest pain 01/07/2018  . Coronary artery calcification 01/07/2018  . CKD (chronic kidney disease) stage 3, GFR 30-59 ml/min (HCC) 11/25/2017  . Chronic diastolic heart failure ([place]) 05/13/2017  . Aortic stenosis, mild 05/13/2017  . Low back pain 05/12/2017  . GERD (gastroesophageal reflux disease) 05/12/2017  . Prostate nodule 05/12/2017  . Obesity 05/12/2017  . Depression 05/12/2017  . Hx of colonic polyps 05/12/2017  . 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 Type 2 diabetes mellitus ([place]) 05/12/2017  . Hyperlipidemia 05/12/2017  . Hypertensive heart disease with heart failure ([place]) 05/12/2017  . Osteoarthritis of knee 05/12/2017  . Sleep apnea 05/12/2017  .  Actinic keratosis 05/12/2017  . 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 Seborrheic dermatitis 05/12/2017  . Lumbar radiculopathy 05/12/2017  . Paresthesia 05/12/2017    Past Surgical History:  Procedure Laterality Date  . HAND SURGERY    . REPLACEMENT TOTAL KNEE Right   . SHOULDER SURGERY Right         Home Medications    Prior to Admission medications   Medication Sig Start Date End Date Taking? Authorizing Provider  aspirin 81 MG chewable tablet Chew 81 mg by mouth daily.    [provider]  atorvastatin (LIPITOR) 80 MG tablet Take 80 mg by mouth daily.    [provider]  DULoxetine (CYMBALTA) 20 MG capsule Take 20 mg by mouth daily.    [provider]  EPINEPHrine 0.3 mg/0.3 mL IJ SOAJ injection Inject 0.3 mg into the muscle once.    [provider]  glipiZIDE (GLUCOTROL) 5 MG tablet Take 2.5 mg by mouth 2 (two) times daily before a meal.    [provider]  loratadine (CLARITIN) 10 MG tablet Take 10 mg by mouth daily.    [provider]  losartan (COZAAR) 25 MG tablet Take 25 mg by mouth daily.    [provider]  Meclizine HCl 25 MG CHEW Chew 1 tablet by mouth 2 (two) times daily as needed (dizziness).    [provider]  metFORMIN (GLUCOPHAGE) 1000 MG tablet Take 1,000 mg  by mouth 2 (two) times daily with a meal.    [provider]  methadone (DOLOPHINE) 5 MG tablet Take 5 mg by mouth daily.    [provider]  oxyCODONE-acetaminophen (PERCOCET/ROXICET) 5-325 MG tablet Take 1 tablet by mouth 5 (five) times daily as needed for severe pain.     [provider]  salsalate (DISALCID) 750 MG tablet Take 1,500 mg by mouth 2 (two) times daily.    [provider]  sildenafil (VIAGRA) 50 MG tablet Take 25 mg by mouth daily as needed for erectile dysfunction.    [provider]  terazosin (HYTRIN) 2 MG capsule Take 2 mg by mouth at  bedtime.    [provider]  torsemide (DEMADEX) 20 MG tablet Take 20 mg by mouth daily as needed. Takes 3 times per week    [provider]    Family History Family History  Problem Relation Age of Onset  . Hypertension Mother   . Hypertension Father   . Alzheimer's disease Father     Social History Social History   Tobacco Use  . Smoking status: Current Every Day Smoker    Packs/day: 1.00    Years: 55.00    Pack years: 55.00  . Smokeless tobacco: Never Used  Substance Use Topics  . Alcohol use: No  . Drug use: No     Allergies   Wasp venom   Review of Systems Review of Systems  Constitutional: Negative for appetite change, chills, diaphoresis, fatigue and fever.  HENT: Negative for mouth sores, sore throat and trouble swallowing.   Eyes: Negative for visual disturbance.  Respiratory: Negative for cough, chest tightness, shortness of breath and wheezing.   Cardiovascular: Positive for chest pain.  Gastrointestinal: Negative for abdominal distention, abdominal pain, diarrhea, nausea and vomiting.  Endocrine: Negative for polydipsia, polyphagia and polyuria.  Genitourinary: Negative for dysuria, frequency and hematuria.  Musculoskeletal: Negative for gait problem.  Skin: Negative for color change, pallor and rash.  Neurological: Negative for dizziness, syncope, light-headedness and headaches.  Hematological: Does not bruise/bleed easily.  Psychiatric/Behavioral: Negative for behavioral problems and confusion.     Physical Exam Updated Vital Signs BP (!) 168/84   Pulse 64   Temp 98.2 F (36.8 C) (Oral)   Resp 16   SpO2 94%   Physical Exam  Constitutional: He is oriented to person, place, and time. He appears well-developed and well-nourished. No distress.  HENT:  Head: Normocephalic.  Eyes: Pupils are equal, round, and reactive to light. Conjunctivae are normal. No scleral icterus.  Neck: Normal range of motion. Neck supple. No  thyromegaly present.  Cardiovascular: Normal rate and regular rhythm. Exam reveals no gallop and no friction rub.  No murmur heard. Pulmonary/Chest: Effort normal and breath sounds normal. No respiratory distress. He has no wheezes. He has no rales.  Abdominal: Soft. Bowel sounds are normal. He exhibits no distension. There is no tenderness. There is no rebound.  Musculoskeletal: Normal range of motion.  Neurological: He is alert and oriented to person, place, and time.  Skin: Skin is warm and dry. No rash noted.  Psychiatric: He has a normal mood and affect. His behavior is normal.     ED Treatments / Results  Labs (all labs ordered are listed, but only abnormal results are displayed) Labs Reviewed  BASIC METABOLIC PANEL - Abnormal; Notable for the following components:      Result Value   Glucose, Bld 151 (*)    GFR calc non  Af Amer 58 (*)    All other components within normal limits  CBC  I-STAT TROPONIN, ED  I-STAT TROPONIN, ED    EKG EKG Interpretation  Date/Time:  Monday January 03 2018 10:26:11 EDT Ventricular Rate:  82 PR Interval:  180 QRS Duration: 94 QT Interval:  398 QTC Calculation: 464 R Axis:   -39 Text Interpretation:  Normal sinus rhythm Left axis deviation Lateral infarct , age undetermined Abnormal ECG Confirmed by Tanna Furry [phone]) on 01/03/2018 3:34:00 PM   Radiology Dg Chest 2 View  Result Date: 01/07/2018 CLINICAL DATA:  Pain adjacent sternum. Radiation to posteriorly to the right. EXAM: CHEST - 2 VIEW COMPARISON:  CT 01/03/2018. FINDINGS: Mediastinum and hilar structures normal. Cardiomegaly with normal pulmonary vascularity. Low lung volumes with mild basilar atelectasis. No focal alveolar infiltrate. No pleural effusion or pneumothorax. No acute bony abnormality. Degenerative change thoracic spine. IMPRESSION: 1. Low lung volumes with mild basilar atelectasis. No focal alveolar infiltrate. 2.  Cardiomegaly no pulmonary venous congestion. 3. No acute  bony abnormality. Degenerative changes thoracic spine. Sternal region appears unremarkable. Electronically Signed   By: Marcello Moores  Register   On: 01/07/2018 11:17    Procedures Procedures (including critical care time)  Medications Ordered in ED Medications  ondansetron (ZOFRAN) injection 4 mg (4 mg Intravenous Given 01/03/18 1606)  iopamidol (ISOVUE-370) 76 % injection (  Contrast Given 01/03/18 1630)     Initial Impression / Assessment and Plan / ED Course  I have reviewed the triage vital signs and the nursing notes.  Pertinent labs & imaging results that were available during my care of the patient were reviewed by me and considered in my medical decision making (see chart for details).    EKG shows no acute or ischemic changes.  CT scan of the chest ordered.  This was completed.  However patient would not wait for completion of studies or additional testing.  He was adamant that he was leaving and stated that "you had enough time you should have figured this out by now.  I did discuss with him that I was not able to give him completed results of his CT scan before discharge and if it showed abnormalities he may be asked to return.  Final Clinical Impressions(s) / ED Diagnoses   Final diagnoses:  Chest pain, unspecified type    ED Discharge Orders    None       Tanna Furry, MD 01/08/18 [phone]

## 2018-01-11 ENCOUNTER — Ambulatory Visit (INDEPENDENT_AMBULATORY_CARE_PROVIDER_SITE_OTHER): Payer: Medicare Other | Admitting: Cardiology

## 2018-01-11 ENCOUNTER — Encounter: Payer: Self-pay | Admitting: Cardiology

## 2018-01-11 VITALS — BP 146/68 | HR 87 | Ht 71.0 in | Wt 247.0 lb

## 2018-01-11 DIAGNOSIS — I2510 Atherosclerotic heart disease of native coronary artery without angina pectoris: Secondary | ICD-10-CM | POA: Diagnosis not present

## 2018-01-11 DIAGNOSIS — R072 Precordial pain: Secondary | ICD-10-CM

## 2018-01-11 DIAGNOSIS — I2584 Coronary atherosclerosis due to calcified coronary lesion: Secondary | ICD-10-CM | POA: Diagnosis not present

## 2018-01-11 DIAGNOSIS — I350 Nonrheumatic aortic (valve) stenosis: Secondary | ICD-10-CM | POA: Diagnosis not present

## 2018-01-11 DIAGNOSIS — E119 Type 2 diabetes mellitus without complications: Secondary | ICD-10-CM

## 2018-01-11 DIAGNOSIS — I110 Hypertensive heart disease with heart failure: Secondary | ICD-10-CM | POA: Diagnosis not present

## 2018-01-11 MED ORDER — NITROGLYCERIN 0.4 MG SL SUBL: 0.4000 mg | SUBLINGUAL_TABLET | SUBLINGUAL | 3 refills | Status: DC | PRN

## 2018-01-11 NOTE — Patient Instructions (Addendum)
Medication Instructions:  Your physician has recommended you make the following change in your medication:  START nitroglycerin as needed for chest pain: if you start having chest pain, stop what you are doing and sit down. Take one nitro, let it dissolve under your tongue, wait 5 minutes. If you are still having chest pain, this can be done twice more with a total of 3 nitroglycerin tablets in 15 minutes. After your third nitro, please call 911.   Labwork: None  Testing/Procedures: You had an EKG today.     [facility] [address] [name] [address] Dept: [phone] Loc: [facility]  01/11/2018  You are scheduled for a Cardiac Catheterization on Wednesday, May 8 with Dr. Daneen Schick.  1. Please arrive at the [facility] (Main Entrance A) at [facility]: [address] [place], [place] [address] at 12:30 PM (two hours before your procedure to ensure your preparation). Free valet parking service is available.   Special note: Every effort is made to have your procedure done on time. Please understand that emergencies sometimes delay scheduled procedures.  2. Diet: Do not eat or drink anything after midnight prior to your procedure except sips of water to take medications.  3. Labs: None needed.  4. Medication instructions in preparation for your procedure:   Stop taking, Glucophage (Metformin) and Glipizideon Wednesday, May 8. Do not take metformin for 48 hours after the procedure to avoid kidney damage. You can resume glipizide as usual after heart cath is complete.   Hold losartan and torsemide today and tomorrow. After cardiac catheterization, you can start taking these medications as you normally do.    On the morning of your procedure, take your Aspirin and any morning medicines NOT listed above.  You may use sips of water.  5. Plan for one night  stay--bring personal belongings. 6. Bring a current list of your medications and current insurance cards. 7. You MUST have a responsible person to drive you home. 8. Someone MUST be with you the first 24 hours after you arrive home or your discharge will be delayed. 9. Please wear clothes that are easy to get on and off and wear slip-on shoes.  Thank you for allowing [place] to care for you!   -- [facility]   Follow-Up: Your physician recommends that you schedule a follow-up appointment in: 1 month.   Any Other Special Instructions Will Be Listed Below (If Applicable).     If you need a refill on your cardiac medications before your next appointment, please call your pharmacy.

## 2018-01-11 NOTE — Progress Notes (Signed)
Cardiology Office Note:    Date:  01/11/2018   ID:  WISE FEES, DOB 1945-12-19, MRN [id number]  PCP:  [name], MD  Cardiologist:  [name], MD    Referring MD: [name], MD   Chief Complaint  Patient presents with  . Chest Pain    Radiating to the pain  Still having chest pain  History of Present Illness:    Thomas [name] is a 72 y.o. male with multiple risk factors for coronary artery disease.  That include hypertension diabetes smoking, dyslipidemia.  He saw my partner last week with somewhat atypical chest pain.  He described pain in the right upper quadrant of the abdomen also sometimes in the middle of the chest.  Typically pain is not related to exercise can last for up to a few minutes.  Because of multiple risk factors for coronary artery disease he was recommended to have cardiac catheterization.  Actually he was sent to the emergency room with intention to be admitted and did not have cardiac catheterization done.  However, he signed AMA from the emergency room.  He said he preferred to enjoy his weekend rather than being in the hospital waiting for cardiac catheterization.  During that weekend he was weak tired but still was able to walk.  Still describe recurrent chest pain.  Somewhat atypical but worrisome.  He presented today to my office to talk about cardiac catheterization he said he is ready to do it.  He still does not want to be admitted to the hospital he preferred to have it done tomorrow as an outpatient.  I described to him cardiac catheterization procedure including all risk benefits as well as alternative.  I told him if he started having more pain that is not relieved by nitroglycerin he need to go to the emergency room.  He understands. We also started talking with risk factors modifications namely controlling his diabetes, control his blood pressure as well as quitting smoking.  He does have sleep apnea however he does not use mask this is also  another issue that will be discussed in the future.  Past Medical History:  Diagnosis Date  . Actinic keratosis 05/12/2017  . Back pain   . Depression 05/12/2017  . Diabetes ([place])   . GERD (gastroesophageal reflux disease) 05/12/2017  . Hx of colonic polyps 05/12/2017  . Hyperlipidemia 05/12/2017  . Hypertension   . 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 Low back pain 05/12/2017  . Lumbar radiculopathy 05/12/2017  . 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 Obesity 05/12/2017  . Osteoarthritis of knee 05/12/2017  . Paresthesia 05/12/2017  . 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 Prostate nodule 05/12/2017  . Seborrheic dermatitis 05/12/2017    Past Surgical History:  Procedure Laterality Date  . HAND SURGERY    . REPLACEMENT TOTAL KNEE Right   . SHOULDER SURGERY Right     Current Medications: Current Meds  Medication Sig  . aspirin 81 MG chewable tablet Chew 81 mg by mouth daily.  [name] atorvastatin (LIPITOR) 80 MG tablet Take 80 mg by mouth daily.  . DULoxetine (CYMBALTA) 20 MG capsule Take 20 mg by mouth daily.  [name] EPINEPHrine 0.3 mg/0.3 mL IJ SOAJ injection Inject 0.3 mg into the muscle once.  [name] glipiZIDE (GLUCOTROL) 5 MG tablet Take 2.5 mg by mouth 2 (two) times daily before a meal.  . loratadine (CLARITIN) 10 MG tablet Take 10 mg by mouth daily.  [name] losartan (COZAAR) 25 MG tablet Take 25 mg  by mouth daily.  . Meclizine HCl 25 MG CHEW Chew 1 tablet by mouth 2 (two) times daily as needed (dizziness).  . metFORMIN (GLUCOPHAGE) 1000 MG tablet Take 1,000 mg by mouth 2 (two) times daily with a meal.  . methadone (DOLOPHINE) 5 MG tablet Take 5 mg by mouth daily.  [name] oxyCODONE-acetaminophen (PERCOCET/ROXICET) 5-325 MG tablet Take 1 tablet by mouth 5 (five) times daily as needed for severe pain.   . salsalate (DISALCID) 750 MG tablet Take 1,500 mg by mouth 2 (two) times daily.  . sildenafil (VIAGRA) 50 MG tablet Take 25 mg by mouth daily as needed for erectile dysfunction.  [name] terazosin (HYTRIN) 2 MG capsule Take 2 mg by  mouth at bedtime.  . torsemide (DEMADEX) 20 MG tablet Take 20 mg by mouth daily as needed. Takes 3 times per week     Allergies:   Wasp venom   Social History   Socioeconomic History  . Marital status: Married    Spouse name: Not on file  . Number of children: Not on file  . Years of education: Not on file  . Highest education level: Not on file  Occupational History  . Not on file  Social Needs  . Financial resource strain: Not on file  . Food insecurity:    Worry: Not on file    Inability: Not on file  . Transportation needs:    Medical: Not on file    Non-medical: Not on file  Tobacco Use  . Smoking status: Current Every Day Smoker    Packs/day: 1.00    Years: 55.00    Pack years: 55.00  . Smokeless tobacco: Never Used  Substance and Sexual Activity  . Alcohol use: No  . Drug use: No  . Sexual activity: Never    Birth control/protection: None  Lifestyle  . Physical activity:    Days per week: Not on file    Minutes per session: Not on file  . Stress: Not on file  Relationships  . Social connections:    Talks on phone: Not on file    Gets together: Not on file    Attends religious service: Not on file    Active member of club or organization: Not on file    Attends meetings of clubs or organizations: Not on file    Relationship status: Not on file  Other Topics Concern  . Not on file  Social History Narrative  . Not on file     Family History: The patient's family history includes Alzheimer's disease in his father; Hypertension in his father and mother. ROS:   Please see the history of present illness.    All 14 point review of systems negative except as described per history of present illness  EKGs/Labs/Other Studies Reviewed:      Recent Labs: 05/14/2017: BNP 212.0 01/07/2018: ALT 15; BUN 22; Creatinine, Ser 0.98; Hemoglobin 14.7; Platelets 248; Potassium 4.1; Sodium 139  Recent Lipid Panel No results found for: CHOL, TRIG, HDL, CHOLHDL, VLDL,  LDLCALC, LDLDIRECT  Physical Exam:    VS:  BP (!) 146/68   Pulse 87   Ht _0  (1.803 m)   Wt 247 lb (112 kg)   SpO2 97%   BMI 34.45 kg/m     Wt Readings from Last 3 Encounters:  01/11/18 247 lb (112 kg)  01/07/18 245 lb (111.1 kg)  01/07/18 245 lb (111.1 kg)     GEN:  Well nourished, well developed in no acute  distress HEENT: Normal NECK: No JVD; No carotid bruits LYMPHATICS: No lymphadenopathy CARDIAC: RRR, systolic ejection murmur grade 2/6 best heard at the right upper portion of the sternum, no rubs, no gallops RESPIRATORY:  Clear to auscultation without rales, wheezing or rhonchi  ABDOMEN: Soft, non-tender, non-distended MUSCULOSKELETAL:  No edema; No deformity  SKIN: Warm and dry LOWER EXTREMITIES: no swelling NEUROLOGIC:  Alert and oriented x 3 PSYCHIATRIC:  Normal affect   ASSESSMENT:    1. Precordial chest pain   2. Type 2 diabetes mellitus without complication, without long-term current use of insulin ([place])   3. Aortic stenosis, mild   4. Hypertensive heart disease with heart failure ([place])   5. Coronary artery calcification    PLAN:    In order of problems listed above:  1. Precordial chest pain with some atypical future but gentleman does have multiple risk factors for coronary artery disease on top of that he does have congestive heart failure.  The best way to address his issue will be to proceed with cardiac catheterization.  Procedure explained to him including all risk benefits as well as alternatives will proceed.  I will give him prescription for nitroglycerin ask him to take it on as-needed basis.  He was told to go to the emergency room if pain is not relieved by nitroglycerin. 2. If his cardiac catheterization will show no critical blockages that could explain his symptoms we most likely need to investigate his gallbladder as well as stomach. 3. Type 2 diabetes he tells me that his hemoglobin A1c 6.9.  We will continue monitoring and make sure that  his diabetes will be well controlled 4. Aortic stenosis apparently mild.  We will continue monitoring. 5. Hypertensive heart disease.  With congestive heart failure appears to be compensated we will continue present management. 6. Dense coronary artery calcification.  Cardiac catheterization will be done to clarify degree of narrowings.   Medication Adjustments/Labs and Tests Ordered: Current medicines are reviewed at length with the patient today.  Concerns regarding medicines are outlined above.  No orders of the defined types were placed in this encounter.  Medication changes: No orders of the defined types were placed in this encounter.   Signed, Park Liter, MD, [facility] 01/11/2018 8:34 AM    [place]

## 2018-01-11 NOTE — H&P (View-Only) (Signed)
Cardiology Office Note:    Date:  01/11/2018   ID:  Thomas [name], DOB 1945-12-19, MRN [id number]  PCP:  [name], MD  Cardiologist:  [name], MD    Referring MD: [name], MD   Chief Complaint  Patient presents with  . Chest Pain    Radiating to the pain  Still having chest pain  History of Present Illness:    Thomas [name] is a 72 y.o. male with multiple risk factors for coronary artery disease.  That include hypertension diabetes smoking, dyslipidemia.  He saw my partner last week with somewhat atypical chest pain.  He described pain in the right upper quadrant of the abdomen also sometimes in the middle of the chest.  Typically pain is not related to exercise can last for up to a few minutes.  Because of multiple risk factors for coronary artery disease he was recommended to have cardiac catheterization.  Actually he was sent to the emergency room with intention to be admitted and did not have cardiac catheterization done.  However, he signed AMA from the emergency room.  He said he preferred to enjoy his weekend rather than being in the hospital waiting for cardiac catheterization.  During that weekend he was weak tired but still was able to walk.  Still describe recurrent chest pain.  Somewhat atypical but worrisome.  He presented today to my office to talk about cardiac catheterization he said he is ready to do it.  He still does not want to be admitted to the hospital he preferred to have it done tomorrow as an outpatient.  I described to him cardiac catheterization procedure including all risk benefits as well as alternative.  I told him if he started having more pain that is not relieved by nitroglycerin he need to go to the emergency room.  He understands. We also started talking with risk factors modifications namely controlling his diabetes, control his blood pressure as well as quitting smoking.  He does have sleep apnea however he does not use mask this is also  another issue that will be discussed in the future.  Past Medical History:  Diagnosis Date  . Actinic keratosis 05/12/2017  . Back pain   . Depression 05/12/2017  . Diabetes ([place])   . GERD (gastroesophageal reflux disease) 05/12/2017  . Hx of colonic polyps 05/12/2017  . Hyperlipidemia 05/12/2017  . Hypertension   . 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 Low back pain 05/12/2017  . Lumbar radiculopathy 05/12/2017  . 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 Obesity 05/12/2017  . Osteoarthritis of knee 05/12/2017  . Paresthesia 05/12/2017  . 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 Prostate nodule 05/12/2017  . Seborrheic dermatitis 05/12/2017    Past Surgical History:  Procedure Laterality Date  . HAND SURGERY    . REPLACEMENT TOTAL KNEE Right   . SHOULDER SURGERY Right     Current Medications: Current Meds  Medication Sig  . aspirin 81 MG chewable tablet Chew 81 mg by mouth daily.  [name] atorvastatin (LIPITOR) 80 MG tablet Take 80 mg by mouth daily.  . DULoxetine (CYMBALTA) 20 MG capsule Take 20 mg by mouth daily.  [name] EPINEPHrine 0.3 mg/0.3 mL IJ SOAJ injection Inject 0.3 mg into the muscle once.  [name] glipiZIDE (GLUCOTROL) 5 MG tablet Take 2.5 mg by mouth 2 (two) times daily before a meal.  . loratadine (CLARITIN) 10 MG tablet Take 10 mg by mouth daily.  [name] losartan (COZAAR) 25 MG tablet Take 25 mg  by mouth daily.  . Meclizine HCl 25 MG CHEW Chew 1 tablet by mouth 2 (two) times daily as needed (dizziness).  . metFORMIN (GLUCOPHAGE) 1000 MG tablet Take 1,000 mg by mouth 2 (two) times daily with a meal.  . methadone (DOLOPHINE) 5 MG tablet Take 5 mg by mouth daily.  [name] oxyCODONE-acetaminophen (PERCOCET/ROXICET) 5-325 MG tablet Take 1 tablet by mouth 5 (five) times daily as needed for severe pain.   . salsalate (DISALCID) 750 MG tablet Take 1,500 mg by mouth 2 (two) times daily.  . sildenafil (VIAGRA) 50 MG tablet Take 25 mg by mouth daily as needed for erectile dysfunction.  [name] terazosin (HYTRIN) 2 MG capsule Take 2 mg by  mouth at bedtime.  . torsemide (DEMADEX) 20 MG tablet Take 20 mg by mouth daily as needed. Takes 3 times per week     Allergies:   Wasp venom   Social History   Socioeconomic History  . Marital status: Married    Spouse name: Not on file  . Number of children: Not on file  . Years of education: Not on file  . Highest education level: Not on file  Occupational History  . Not on file  Social Needs  . Financial resource strain: Not on file  . Food insecurity:    Worry: Not on file    Inability: Not on file  . Transportation needs:    Medical: Not on file    Non-medical: Not on file  Tobacco Use  . Smoking status: Current Every Day Smoker    Packs/day: 1.00    Years: 55.00    Pack years: 55.00  . Smokeless tobacco: Never Used  Substance and Sexual Activity  . Alcohol use: No  . Drug use: No  . Sexual activity: Never    Birth control/protection: None  Lifestyle  . Physical activity:    Days per week: Not on file    Minutes per session: Not on file  . Stress: Not on file  Relationships  . Social connections:    Talks on phone: Not on file    Gets together: Not on file    Attends religious service: Not on file    Active member of club or organization: Not on file    Attends meetings of clubs or organizations: Not on file    Relationship status: Not on file  Other Topics Concern  . Not on file  Social History Narrative  . Not on file     Family History: The patient's family history includes Alzheimer's disease in his father; Hypertension in his father and mother. ROS:   Please see the history of present illness.    All 14 point review of systems negative except as described per history of present illness  EKGs/Labs/Other Studies Reviewed:      Recent Labs: 05/14/2017: BNP 212.0 01/07/2018: ALT 15; BUN 22; Creatinine, Ser 0.98; Hemoglobin 14.7; Platelets 248; Potassium 4.1; Sodium 139  Recent Lipid Panel No results found for: CHOL, TRIG, HDL, CHOLHDL, VLDL,  LDLCALC, LDLDIRECT  Physical Exam:    VS:  BP (!) 146/68   Pulse 87   Ht _0  (1.803 m)   Wt 247 lb (112 kg)   SpO2 97%   BMI 34.45 kg/m     Wt Readings from Last 3 Encounters:  01/11/18 247 lb (112 kg)  01/07/18 245 lb (111.1 kg)  01/07/18 245 lb (111.1 kg)     GEN:  Well nourished, well developed in no acute  distress HEENT: Normal NECK: No JVD; No carotid bruits LYMPHATICS: No lymphadenopathy CARDIAC: RRR, systolic ejection murmur grade 2/6 best heard at the right upper portion of the sternum, no rubs, no gallops RESPIRATORY:  Clear to auscultation without rales, wheezing or rhonchi  ABDOMEN: Soft, non-tender, non-distended MUSCULOSKELETAL:  No edema; No deformity  SKIN: Warm and dry LOWER EXTREMITIES: no swelling NEUROLOGIC:  Alert and oriented x 3 PSYCHIATRIC:  Normal affect   ASSESSMENT:    1. Precordial chest pain   2. Type 2 diabetes mellitus without complication, without long-term current use of insulin ([place])   3. Aortic stenosis, mild   4. Hypertensive heart disease with heart failure ([place])   5. Coronary artery calcification    PLAN:    In order of problems listed above:  1. Precordial chest pain with some atypical future but gentleman does have multiple risk factors for coronary artery disease on top of that he does have congestive heart failure.  The best way to address his issue will be to proceed with cardiac catheterization.  Procedure explained to him including all risk benefits as well as alternatives will proceed.  I will give him prescription for nitroglycerin ask him to take it on as-needed basis.  He was told to go to the emergency room if pain is not relieved by nitroglycerin. 2. If his cardiac catheterization will show no critical blockages that could explain his symptoms we most likely need to investigate his gallbladder as well as stomach. 3. Type 2 diabetes he tells me that his hemoglobin A1c 6.9.  We will continue monitoring and make sure that  his diabetes will be well controlled 4. Aortic stenosis apparently mild.  We will continue monitoring. 5. Hypertensive heart disease.  With congestive heart failure appears to be compensated we will continue present management. 6. Dense coronary artery calcification.  Cardiac catheterization will be done to clarify degree of narrowings.   Medication Adjustments/Labs and Tests Ordered: Current medicines are reviewed at length with the patient today.  Concerns regarding medicines are outlined above.  No orders of the defined types were placed in this encounter.  Medication changes: No orders of the defined types were placed in this encounter.   Signed, Park Liter, MD, [facility] 01/11/2018 8:34 AM    [place]

## 2018-01-12 ENCOUNTER — Ambulatory Visit (HOSPITAL_COMMUNITY)
Admission: RE | Disposition: A | Discharge status: Home / Self Care | Payer: Self-pay | Source: Ambulatory Visit | Attending: Interventional Cardiology

## 2018-01-12 ENCOUNTER — Ambulatory Visit (HOSPITAL_COMMUNITY)
Admission: RE | Admit: 2018-01-12 | Discharge: 2018-01-12 | Disposition: A | Discharge status: Home / Self Care | Payer: Medicare Other | Source: Ambulatory Visit | Attending: Interventional Cardiology | Admitting: Interventional Cardiology

## 2018-01-12 ENCOUNTER — Encounter (HOSPITAL_COMMUNITY): Payer: Self-pay | Admitting: *Deleted

## 2018-01-12 DIAGNOSIS — E1142 Type 2 diabetes mellitus with diabetic polyneuropathy: Secondary | ICD-10-CM | POA: Diagnosis not present

## 2018-01-12 DIAGNOSIS — I5032 Chronic diastolic (congestive) heart failure: Secondary | ICD-10-CM | POA: Diagnosis not present

## 2018-01-12 DIAGNOSIS — I110 Hypertensive heart disease with heart failure: Secondary | ICD-10-CM | POA: Diagnosis not present

## 2018-01-12 DIAGNOSIS — E785 Hyperlipidemia, unspecified: Secondary | ICD-10-CM | POA: Diagnosis not present

## 2018-01-12 DIAGNOSIS — Z79899 Other long term (current) drug therapy: Secondary | ICD-10-CM | POA: Insufficient documentation

## 2018-01-12 DIAGNOSIS — F329 Major depressive disorder, single episode, unspecified: Secondary | ICD-10-CM | POA: Insufficient documentation

## 2018-01-12 DIAGNOSIS — I2584 Coronary atherosclerosis due to calcified coronary lesion: Secondary | ICD-10-CM | POA: Diagnosis not present

## 2018-01-12 DIAGNOSIS — Z6834 Body mass index (BMI) 34.0-34.9, adult: Secondary | ICD-10-CM | POA: Insufficient documentation

## 2018-01-12 DIAGNOSIS — I25119 Atherosclerotic heart disease of native coronary artery with unspecified angina pectoris: Secondary | ICD-10-CM | POA: Diagnosis not present

## 2018-01-12 DIAGNOSIS — Z7982 Long term (current) use of aspirin: Secondary | ICD-10-CM | POA: Insufficient documentation

## 2018-01-12 DIAGNOSIS — G253 Myoclonus: Secondary | ICD-10-CM | POA: Diagnosis not present

## 2018-01-12 DIAGNOSIS — E119 Type 2 diabetes mellitus without complications: Secondary | ICD-10-CM

## 2018-01-12 DIAGNOSIS — K219 Gastro-esophageal reflux disease without esophagitis: Secondary | ICD-10-CM | POA: Diagnosis not present

## 2018-01-12 DIAGNOSIS — Z91030 Bee allergy status: Secondary | ICD-10-CM | POA: Insufficient documentation

## 2018-01-12 DIAGNOSIS — I350 Nonrheumatic aortic (valve) stenosis: Secondary | ICD-10-CM | POA: Diagnosis not present

## 2018-01-12 DIAGNOSIS — R072 Precordial pain: Secondary | ICD-10-CM

## 2018-01-12 DIAGNOSIS — E669 Obesity, unspecified: Secondary | ICD-10-CM | POA: Insufficient documentation

## 2018-01-12 DIAGNOSIS — Z86010 Personal history of colonic polyps: Secondary | ICD-10-CM | POA: Insufficient documentation

## 2018-01-12 DIAGNOSIS — Z96651 Presence of right artificial knee joint: Secondary | ICD-10-CM | POA: Insufficient documentation

## 2018-01-12 DIAGNOSIS — R0789 Other chest pain: Secondary | ICD-10-CM

## 2018-01-12 DIAGNOSIS — I2510 Atherosclerotic heart disease of native coronary artery without angina pectoris: Secondary | ICD-10-CM | POA: Diagnosis present

## 2018-01-12 DIAGNOSIS — M5416 Radiculopathy, lumbar region: Secondary | ICD-10-CM | POA: Diagnosis not present

## 2018-01-12 DIAGNOSIS — F17210 Nicotine dependence, cigarettes, uncomplicated: Secondary | ICD-10-CM | POA: Insufficient documentation

## 2018-01-12 DIAGNOSIS — Z7984 Long term (current) use of oral hypoglycemic drugs: Secondary | ICD-10-CM | POA: Diagnosis not present

## 2018-01-12 DIAGNOSIS — Z8249 Family history of ischemic heart disease and other diseases of the circulatory system: Secondary | ICD-10-CM | POA: Insufficient documentation

## 2018-01-12 HISTORY — PX: RIGHT/LEFT HEART CATH AND CORONARY ANGIOGRAPHY: CATH118266

## 2018-01-12 LAB — POCT I-STAT 3, VENOUS BLOOD GAS (G3P V)
Acid-base deficit: 1 mmol/L (ref 0.0–2.0)
BICARBONATE: 23.8 mmol/L (ref 20.0–28.0)
O2 Saturation: 68 %
PH VEN: 7.371 (ref 7.250–7.430)
PO2 VEN: 37 mmHg (ref 32.0–45.0)
TCO2: 25 mmol/L (ref 22–32)
pCO2, Ven: 41.1 mmHg — ABNORMAL LOW (ref 44.0–60.0)

## 2018-01-12 LAB — GLUCOSE, CAPILLARY
GLUCOSE-CAPILLARY: 120 mg/dL — AB (ref 65–99)
GLUCOSE-CAPILLARY: 83 mg/dL (ref 65–99)

## 2018-01-12 LAB — POCT I-STAT 3, ART BLOOD GAS (G3+)
Acid-base deficit: 1 mmol/L (ref 0.0–2.0)
Bicarbonate: 23.9 mmol/L (ref 20.0–28.0)
O2 Saturation: 93 %
PCO2 ART: 39.5 mmHg (ref 32.0–48.0)
TCO2: 25 mmol/L (ref 22–32)
pH, Arterial: 7.391 (ref 7.350–7.450)
pO2, Arterial: 69 mmHg — ABNORMAL LOW (ref 83.0–108.0)

## 2018-01-12 SURGERY — RIGHT/LEFT HEART CATH AND CORONARY ANGIOGRAPHY
Anesthesia: LOCAL

## 2018-01-12 MED ORDER — HEPARIN SODIUM (PORCINE) 1000 UNIT/ML IJ SOLN
INTRAMUSCULAR | Status: AC
  Filled 2018-01-12: qty 1

## 2018-01-12 MED ORDER — FENTANYL CITRATE (PF) 100 MCG/2ML IJ SOLN
INTRAMUSCULAR | Status: DC | PRN
  Administered 2018-01-12: 50 ug via INTRAVENOUS

## 2018-01-12 MED ORDER — MIDAZOLAM HCL 2 MG/2ML IJ SOLN
INTRAMUSCULAR | Status: DC | PRN
  Administered 2018-01-12 (×2): 1 mg via INTRAVENOUS

## 2018-01-12 MED ORDER — HYDRALAZINE HCL 20 MG/ML IJ SOLN
INTRAMUSCULAR | Status: AC
  Filled 2018-01-12: qty 1

## 2018-01-12 MED ORDER — SODIUM CHLORIDE 0.9% FLUSH: 3.0000 mL | INTRAVENOUS | Status: DC | PRN

## 2018-01-12 MED ORDER — HYDRALAZINE HCL 20 MG/ML IJ SOLN
INTRAMUSCULAR | Status: DC | PRN
  Administered 2018-01-12: 10 mg via INTRAVENOUS

## 2018-01-12 MED ORDER — SODIUM CHLORIDE 0.9 % WEIGHT BASED INFUSION
3.0000 mL/kg/h | INTRAVENOUS | Status: AC
  Administered 2018-01-12: 3 mL/kg/h via INTRAVENOUS

## 2018-01-12 MED ORDER — SODIUM CHLORIDE 0.9 % IV SOLN: INTRAVENOUS | Status: DC

## 2018-01-12 MED ORDER — SODIUM CHLORIDE 0.9 % WEIGHT BASED INFUSION: 1.0000 mL/kg/h | INTRAVENOUS | Status: DC

## 2018-01-12 MED ORDER — HEPARIN (PORCINE) IN NACL 2-0.9 UNITS/ML
INTRAMUSCULAR | Status: AC | PRN
  Administered 2018-01-12 (×2): 500 mL

## 2018-01-12 MED ORDER — ONDANSETRON HCL 4 MG/2ML IJ SOLN: 4.0000 mg | Freq: Four times a day (QID) | INTRAMUSCULAR | Status: DC | PRN

## 2018-01-12 MED ORDER — SODIUM CHLORIDE 0.9 % IV SOLN: 250.0000 mL | INTRAVENOUS | Status: DC | PRN

## 2018-01-12 MED ORDER — VERAPAMIL HCL 2.5 MG/ML IV SOLN
INTRAVENOUS | Status: AC
  Filled 2018-01-12: qty 2

## 2018-01-12 MED ORDER — IOHEXOL 350 MG/ML SOLN
INTRAVENOUS | Status: DC | PRN
  Administered 2018-01-12: 105 mL via INTRA_ARTERIAL

## 2018-01-12 MED ORDER — OXYCODONE HCL 5 MG PO TABS: 5.0000 mg | ORAL_TABLET | ORAL | Status: DC | PRN

## 2018-01-12 MED ORDER — VERAPAMIL HCL 2.5 MG/ML IV SOLN
INTRAVENOUS | Status: DC | PRN
  Administered 2018-01-12: 10 mL via INTRA_ARTERIAL

## 2018-01-12 MED ORDER — MIDAZOLAM HCL 2 MG/2ML IJ SOLN
INTRAMUSCULAR | Status: AC
  Filled 2018-01-12: qty 2

## 2018-01-12 MED ORDER — HEPARIN SODIUM (PORCINE) 1000 UNIT/ML IJ SOLN
INTRAMUSCULAR | Status: DC | PRN
  Administered 2018-01-12: 5000 [IU] via INTRAVENOUS

## 2018-01-12 MED ORDER — SODIUM CHLORIDE 0.9% FLUSH: 3.0000 mL | Freq: Two times a day (BID) | INTRAVENOUS | Status: DC

## 2018-01-12 MED ORDER — LIDOCAINE HCL (PF) 1 % IJ SOLN
INTRAMUSCULAR | Status: AC
  Filled 2018-01-12: qty 30

## 2018-01-12 MED ORDER — SODIUM CHLORIDE 0.9% FLUSH
3.0000 mL | Freq: Two times a day (BID) | INTRAVENOUS | Status: DC
Start: 2018-01-12 — End: 2018-01-12

## 2018-01-12 MED ORDER — HEPARIN (PORCINE) IN NACL 1000-0.9 UT/500ML-% IV SOLN
INTRAVENOUS | Status: AC
  Filled 2018-01-12: qty 1000

## 2018-01-12 MED ORDER — NITROGLYCERIN 0.4 MG SL SUBL
SUBLINGUAL_TABLET | SUBLINGUAL | Status: AC
  Filled 2018-01-12: qty 1

## 2018-01-12 MED ORDER — ASPIRIN 81 MG PO CHEW: 81.0000 mg | CHEWABLE_TABLET | Freq: Every day | ORAL | Status: DC

## 2018-01-12 MED ORDER — FENTANYL CITRATE (PF) 100 MCG/2ML IJ SOLN
INTRAMUSCULAR | Status: AC
  Filled 2018-01-12: qty 2

## 2018-01-12 MED ORDER — ACETAMINOPHEN 325 MG PO TABS: 650.0000 mg | ORAL_TABLET | ORAL | Status: DC | PRN

## 2018-01-12 MED ORDER — ASPIRIN 81 MG PO CHEW: 81.0000 mg | CHEWABLE_TABLET | ORAL | Status: DC

## 2018-01-12 MED ORDER — LIDOCAINE HCL (PF) 1 % IJ SOLN
INTRAMUSCULAR | Status: DC | PRN
  Administered 2018-01-12 (×2): 2 mL via SUBCUTANEOUS

## 2018-01-12 SURGICAL SUPPLY — 18 items
CABLE ADAPT CONN TEMP 6FT (ADAPTER) IMPLANT
CATH BALLN WEDGE 5F 110CM (CATHETERS) ×2 IMPLANT
CATH INFINITI 5 FR JL3.5 (CATHETERS) ×2 IMPLANT
CATH INFINITI JR4 5F (CATHETERS) ×2 IMPLANT
COVER PRB 48X5XTLSCP FOLD TPE (BAG) ×1 IMPLANT
COVER PROBE 5X48 (BAG) ×1
DEVICE RAD COMP TR BAND LRG (VASCULAR PRODUCTS) ×2 IMPLANT
ELECT DEFIB PAD ADLT CADENCE (PAD) ×2 IMPLANT
GLIDESHEATH SLEND A-KIT 6F 22G (SHEATH) ×2 IMPLANT
GUIDEWIRE INQWIRE 1.5J.035X260 (WIRE) ×1 IMPLANT
INQWIRE 1.5J .035X260CM (WIRE) ×2
KIT HEART LEFT (KITS) ×2 IMPLANT
PACK CARDIAC CATHETERIZATION (CUSTOM PROCEDURE TRAY) ×2 IMPLANT
SHEATH RAIN 4/5FR (SHEATH) ×2 IMPLANT
SLEEVE REPOSITIONING LENGTH 30 (MISCELLANEOUS) IMPLANT
TRANSDUCER W/STOPCOCK (MISCELLANEOUS) ×2 IMPLANT
TUBING CIL FLEX 10 FLL-RA (TUBING) ×2 IMPLANT
WIRE HI TORQ VERSACORE-J 145CM (WIRE) ×2 IMPLANT

## 2018-01-12 NOTE — Interval H&P Note (Signed)
Cath Lab Visit (complete for each Cath Lab visit)  Clinical Evaluation Leading to the Procedure:   ACS: Yes.    Non-ACS:    Anginal Classification: CCS [name]  Anti-ischemic medical therapy: Maximal Therapy (2 or more classes of medications)  Non-Invasive Test Results: High-risk stress test findings: cardiac mortality >3%/year  Prior CABG: No previous CABG      History and Physical Interval Note:  01/12/2018 3:39 PM  Thomas [name]  has presented today for surgery, with the diagnosis of cp  The various methods of treatment have been discussed with the patient and family. After consideration of risks, benefits and other options for treatment, the patient has consented to  Procedure(s): LEFT HEART CATH AND CORONARY ANGIOGRAPHY (N/A) as a surgical intervention .  The patient's history has been reviewed, patient examined, no change in status, stable for surgery.  I have reviewed the patient's chart and labs.  Questions were answered to the patient's satisfaction.     Thomas [name]

## 2018-01-12 NOTE — Discharge Instructions (Signed)
Radial Site Care Refer to this sheet in the next few weeks. These instructions provide you with information about caring for yourself after your procedure. Your health care provider may also give you more specific instructions. Your treatment has been planned according to current medical practices, but problems sometimes occur. Call your health care provider if you have any problems or questions after your procedure. What can I expect after the procedure? After your procedure, it is typical to have the following:  Bruising at the radial site that usually fades within 1-2 weeks.  Blood collecting in the tissue (hematoma) that may be painful to the touch. It should usually decrease in size and tenderness within 1-2 weeks.  Follow these instructions at home:  Take medicines only as directed by your health care provider.  You may shower 24-48 hours after the procedure or as directed by your health care provider. Remove the bandage (dressing) and gently wash the site with plain soap and water. Pat the area dry with a clean towel. Do not rub the site, because this may cause bleeding.  Do not take baths, swim, or use a hot tub until your health care provider approves.  Check your insertion site every day for redness, swelling, or drainage.  Do not apply powder or lotion to the site.  Do not flex or bend the affected arm for 24 hours or as directed by your health care provider.  Do not push or pull heavy objects with the affected arm for 24 hours or as directed by your health care provider.  Do not lift over 10 lb (4.5 kg) for 5 days after your procedure or as directed by your health care provider.  Ask your health care provider when it is okay to: ? Return to work or school. ? Resume usual physical activities or sports. ? Resume sexual activity.  Do not drive home if you are discharged the same day as the procedure. Have someone else drive you.  You may drive 24 hours after the procedure  unless otherwise instructed by your health care provider.  Do not operate machinery or power tools for 24 hours after the procedure.  If your procedure was done as an outpatient procedure, which means that you went home the same day as your procedure, a responsible adult should be with you for the first 24 hours after you arrive home.  Keep all follow-up visits as directed by your health care provider. This is important. Contact a health care provider if:  You have a fever.  You have chills.  You have increased bleeding from the radial site. Hold pressure on the site. CALL 911 Get help right away if:  You have unusual pain at the radial site.  You have redness, warmth, or swelling at the radial site.  You have drainage (other than a small amount of blood on the dressing) from the radial site.  The radial site is bleeding, and the bleeding does not stop after 30 minutes of holding steady pressure on the site.  Your arm or hand becomes pale, cool, tingly, or numb. This information is not intended to replace advice given to you by your health care provider. Make sure you discuss any questions you have with your health care provider. Document Released: 09/26/2010 Document Revised: 01/30/2016 Document Reviewed: 03/12/2014 Elsevier Interactive Patient Education  2018 [employer].

## 2018-01-13 ENCOUNTER — Encounter (HOSPITAL_COMMUNITY): Payer: Self-pay | Admitting: Interventional Cardiology

## 2018-01-13 MED FILL — Heparin Sod (Porcine)-NaCl IV Soln 1000 Unit/500ML-0.9%: INTRAVENOUS | Qty: 1000 | Status: AC

## 2018-02-11 ENCOUNTER — Ambulatory Visit (INDEPENDENT_AMBULATORY_CARE_PROVIDER_SITE_OTHER): Payer: Medicare Other | Admitting: Cardiology

## 2018-02-11 ENCOUNTER — Ambulatory Visit: Payer: Medicare Other | Admitting: Cardiology

## 2018-02-11 ENCOUNTER — Ambulatory Visit (HOSPITAL_BASED_OUTPATIENT_CLINIC_OR_DEPARTMENT_OTHER)
Admission: RE | Admit: 2018-02-11 | Discharge: 2018-02-11 | Disposition: A | Discharge status: Home / Self Care | Payer: Medicare Other | Source: Ambulatory Visit | Attending: Cardiology | Admitting: Cardiology

## 2018-02-11 ENCOUNTER — Encounter: Payer: Self-pay | Admitting: Cardiology

## 2018-02-11 VITALS — BP 160/70 | HR 76 | Ht 71.0 in | Wt 246.1 lb

## 2018-02-11 DIAGNOSIS — R1011 Right upper quadrant pain: Secondary | ICD-10-CM | POA: Diagnosis not present

## 2018-02-11 DIAGNOSIS — I110 Hypertensive heart disease with heart failure: Secondary | ICD-10-CM

## 2018-02-11 DIAGNOSIS — E782 Mixed hyperlipidemia: Secondary | ICD-10-CM

## 2018-02-11 DIAGNOSIS — I2510 Atherosclerotic heart disease of native coronary artery without angina pectoris: Secondary | ICD-10-CM | POA: Diagnosis not present

## 2018-02-11 DIAGNOSIS — K824 Cholesterolosis of gallbladder: Secondary | ICD-10-CM | POA: Insufficient documentation

## 2018-02-11 DIAGNOSIS — I739 Peripheral vascular disease, unspecified: Secondary | ICD-10-CM | POA: Insufficient documentation

## 2018-02-11 MED ORDER — RANEXA 500 MG PO TB12: 500.0000 mg | ORAL_TABLET | Freq: Two times a day (BID) | ORAL | 6 refills | Status: DC

## 2018-02-11 NOTE — Progress Notes (Signed)
Cardiology Office Note:    Date:  02/11/2018   ID:  DACEN FRAYRE, DOB 11/03/1945, MRN [id number]  PCP:  [name], MD  Cardiologist:  [name], MD    Referring MD: [name], MD   Chief Complaint  Patient presents with  . 1 month follow up  Still having a lot of pain  History of Present Illness:    Thomas [name] is a 72 y.o. male with atherosclerosis.  He just had cardiac catheterization which showed multiple lesion of 60% nothing that need to be target with intervention.  He still complained of having a lot of pain on the right side especially.  There is some suspicion for potentially having a gallbladder problem actually his wife brought that issue up she said that he ate some pizza and after that he threw up and had a lot of pain.  He is still trying to be active however walking is a problem because of pain in his legs when he walks.  Past Medical History:  Diagnosis Date  . Actinic keratosis 05/12/2017  . Back pain   . Depression 05/12/2017  . Diabetes ([place])   . GERD (gastroesophageal reflux disease) 05/12/2017  . Hx of colonic polyps 05/12/2017  . Hyperlipidemia 05/12/2017  . Hypertension   . 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 Low back pain 05/12/2017  . Lumbar radiculopathy 05/12/2017  . 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 Obesity 05/12/2017  . Osteoarthritis of knee 05/12/2017  . Paresthesia 05/12/2017  . 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 Prostate nodule 05/12/2017  . Seborrheic dermatitis 05/12/2017    Past Surgical History:  Procedure Laterality Date  . HAND SURGERY    . REPLACEMENT TOTAL KNEE Right   . RIGHT/LEFT HEART CATH AND CORONARY ANGIOGRAPHY N/A 01/12/2018   Procedure: RIGHT/LEFT HEART CATH AND CORONARY ANGIOGRAPHY;  Surgeon: Belva Crome, MD;  Location: [facility];  Service: Cardiovascular;  Laterality: N/A;  . SHOULDER SURGERY Right     Current Medications: Current Meds  Medication Sig  . aspirin 81 MG chewable tablet Chew 81 mg by mouth daily.   [name] atorvastatin (LIPITOR) 80 MG tablet Take 80 mg by mouth daily.  . DULoxetine (CYMBALTA) 20 MG capsule Take 20 mg by mouth daily.  [name] EPINEPHrine 0.3 mg/0.3 mL IJ SOAJ injection Inject 0.3 mg into the muscle daily as needed (for anaphylaxis).   [name] glipiZIDE (GLUCOTROL) 5 MG tablet Take 2.5 mg by mouth 2 (two) times daily.   [name] loratadine (CLARITIN) 10 MG tablet Take 10 mg by mouth daily.  [name] losartan (COZAAR) 25 MG tablet Take 25 mg by mouth daily.  . Meclizine HCl 25 MG CHEW Chew 25 mg by mouth 2 (two) times daily as needed (dizziness/inner ear issues.).   [name] meloxicam (MOBIC) 15 MG tablet Take 15 mg by mouth daily. Half a tablet twice daily  . metFORMIN (GLUCOPHAGE) 1000 MG tablet Take 1,000 mg by mouth 2 (two) times daily.   . methadone (DOLOPHINE) 5 MG tablet Take 5 mg by mouth daily.  . nitroGLYCERIN (NITROSTAT) 0.4 MG SL tablet Place 1 tablet (0.4 mg total) under the tongue every 5 (five) minutes as needed for chest pain.  [name] oxyCODONE-acetaminophen (PERCOCET/ROXICET) 5-325 MG tablet Take 1 tablet by mouth 5 (five) times daily as needed for severe pain.   . sildenafil (VIAGRA) 50 MG tablet Take 25 mg by mouth daily as needed for erectile dysfunction.  [name] terazosin (HYTRIN) 2 MG capsule Take 2 mg by mouth  at bedtime.     Allergies:   Wasp venom   Social History   Socioeconomic History  . Marital status: Married    Spouse name: Not on file  . Number of children: Not on file  . Years of education: Not on file  . Highest education level: Not on file  Occupational History  . Not on file  Social Needs  . Financial resource strain: Not on file  . Food insecurity:    Worry: Not on file    Inability: Not on file  . Transportation needs:    Medical: Not on file    Non-medical: Not on file  Tobacco Use  . Smoking status: Current Every Day Smoker    Packs/day: 1.00    Years: 55.00    Pack years: 55.00  . Smokeless tobacco: Never Used  . Tobacco comment: Has cut back  Substance and  Sexual Activity  . Alcohol use: No  . Drug use: No  . Sexual activity: Never    Birth control/protection: None  Lifestyle  . Physical activity:    Days per week: Not on file    Minutes per session: Not on file  . Stress: Not on file  Relationships  . Social connections:    Talks on phone: Not on file    Gets together: Not on file    Attends religious service: Not on file    Active member of club or organization: Not on file    Attends meetings of clubs or organizations: Not on file    Relationship status: Not on file  Other Topics Concern  . Not on file  Social History Narrative  . Not on file     Family History: The patient's family history includes Alzheimer's disease in his father; Hypertension in his father and mother. ROS:   Please see the history of present illness.    All 14 point review of systems negative except as described per history of present illness  EKGs/Labs/Other Studies Reviewed:      Recent Labs: 05/14/2017: BNP 212.0 01/07/2018: ALT 15; BUN 22; Creatinine, Ser 0.98; Hemoglobin 14.7; Platelets 248; Potassium 4.1; Sodium 139  Recent Lipid Panel No results found for: CHOL, TRIG, HDL, CHOLHDL, VLDL, LDLCALC, LDLDIRECT  Physical Exam:    VS:  BP (!) 160/70   Pulse 76   Ht _0  (1.803 m)   Wt 246 lb 1.9 oz (111.6 kg)   SpO2 96%   BMI 34.33 kg/m     Wt Readings from Last 3 Encounters:  02/11/18 246 lb 1.9 oz (111.6 kg)  01/12/18 240 lb (108.9 kg)  01/11/18 247 lb (112 kg)     GEN:  Well nourished, well developed in no acute distress HEENT: Normal NECK: No JVD; No carotid bruits LYMPHATICS: No lymphadenopathy CARDIAC: RRR, no murmurs, no rubs, no gallops RESPIRATORY:  Clear to auscultation without rales, wheezing or rhonchi  ABDOMEN: Soft, non-tender, non-distended MUSCULOSKELETAL:  No edema; No deformity  SKIN: Warm and dry LOWER EXTREMITIES: no swelling NEUROLOGIC:  Alert and oriented x 3 PSYCHIATRIC:  Normal affect   ASSESSMENT:     1. Hypertensive heart disease with heart failure ([place])   2. Coronary artery disease involving native coronary artery of native heart without angina pectoris   3. Mixed hyperlipidemia   4. Claudication in peripheral vascular disease ([place])    PLAN:    In order of problems listed above:  1. Coronary artery disease status post cardiac catheterization.  Nonobstructive lesions however  I think it would be beneficial to give him trial of ranolazine 500 mg twice daily.  I told him if it does not help then he simply need to stop otherwise we will concentrate on risk factors modifications.  I with talking length about smoking is strongly advised him to quit.  He is already on aspirin high intensity statin which I will continue. 2. Peripheral vascular disease he did have ABI done by [facility] which is abnormal.  We will schedule him to have arterial duplex of lower extremities. 3. Mixed dyslipidemia on high intensity statin which I will continue for now. 4. Chronic pains follow-up in pain clinic.  We spent a great deal of time talking about his cardiac catheterization his impression was that everything was normal I told him this is not the case he does have moderate stenosis in multiple arteries we need to do everything what we can to prevent progression of this problem.  He still is suffering from a lot of pain I think would be reasonable to investigate his gallbladder.  I will schedule him to have gallbladder ultrasound in the future he may require a HIDA scan.   Medication Adjustments/Labs and Tests Ordered: Current medicines are reviewed at length with the patient today.  Concerns regarding medicines are outlined above.  No orders of the defined types were placed in this encounter.  Medication changes: No orders of the defined types were placed in this encounter.   Signed, Park Liter, MD, [facility] 02/11/2018 9:40 AM    [place]

## 2018-02-11 NOTE — Patient Instructions (Addendum)
Medication Instructions:  Your physician has recommended you make the following change in your medication:  START ranolazine (ranexa) 500 mg twice daily  Labwork: None  Testing/Procedures: Your physician has requested that you have a lower or upper extremity arterial duplex. This test is an ultrasound of the arteries in the legs or arms. It looks at arterial blood flow in the legs and arms. Allow one hour for Lower and Upper Arterial scans. There are no restrictions or special instructions.   You will be schedule for an abdominal ultrasound to evaluate your gallbladder. Please do not eat or drink anything 6 hours before this test.    Follow-Up: Your physician wants you to follow-up in: 3 months. You will receive a reminder letter in the mail two months in advance. If you don't receive a letter, please call our office to schedule the follow-up appointment.   If you need a refill on your cardiac medications before your next appointment, please call your pharmacy.   Thank you for choosing [facility]! Robyne Peers, RN [phone]

## 2018-02-12 ENCOUNTER — Other Ambulatory Visit (HOSPITAL_BASED_OUTPATIENT_CLINIC_OR_DEPARTMENT_OTHER): Payer: Medicare Other

## 2018-02-16 ENCOUNTER — Telehealth: Payer: Self-pay | Admitting: Cardiology

## 2018-02-16 NOTE — Telephone Encounter (Signed)
Wants his sonogram results from last Friday

## 2018-02-17 NOTE — Telephone Encounter (Signed)
Patient informed of results.

## 2018-02-17 NOTE — Telephone Encounter (Signed)
Patient informed of gallbladder ultrasound results. Patient states he is still having chest pain. Patient confirms that he is taking ranexa 500 mg twice daily and it is not helping. Advised patient if he continues to have chest pain, he needs to go to the Emergency Department to be evaluated. Patient states he went to his pain management doctor this week who states this pain "might be related to a nerve in his back." He is scheduled for a nerve block on 02/24/18. Advised patient I would talk with Dr. Agustin Cree on Monday to see if he wants to make any further recommendations. Patient verbalized understanding. No further questions.

## 2018-02-21 ENCOUNTER — Other Ambulatory Visit: Payer: Self-pay

## 2018-02-21 ENCOUNTER — Telehealth: Payer: Self-pay

## 2018-02-21 ENCOUNTER — Emergency Department (HOSPITAL_COMMUNITY): Payer: Medicare Other

## 2018-02-21 ENCOUNTER — Telehealth: Payer: Self-pay | Admitting: Cardiology

## 2018-02-21 ENCOUNTER — Emergency Department (HOSPITAL_COMMUNITY)
Admission: EM | Admit: 2018-02-21 | Discharge: 2018-02-21 | Disposition: A | Discharge status: Home / Self Care | Payer: Medicare Other | Attending: Emergency Medicine | Admitting: Emergency Medicine

## 2018-02-21 ENCOUNTER — Encounter (HOSPITAL_COMMUNITY): Payer: Self-pay | Admitting: *Deleted

## 2018-02-21 DIAGNOSIS — R0789 Other chest pain: Secondary | ICD-10-CM | POA: Diagnosis not present

## 2018-02-21 DIAGNOSIS — R748 Abnormal levels of other serum enzymes: Secondary | ICD-10-CM | POA: Diagnosis not present

## 2018-02-21 DIAGNOSIS — R778 Other specified abnormalities of plasma proteins: Secondary | ICD-10-CM

## 2018-02-21 DIAGNOSIS — I130 Hypertensive heart and chronic kidney disease with heart failure and stage 1 through stage 4 chronic kidney disease, or unspecified chronic kidney disease: Secondary | ICD-10-CM | POA: Insufficient documentation

## 2018-02-21 DIAGNOSIS — I5032 Chronic diastolic (congestive) heart failure: Secondary | ICD-10-CM | POA: Diagnosis not present

## 2018-02-21 DIAGNOSIS — R7989 Other specified abnormal findings of blood chemistry: Secondary | ICD-10-CM | POA: Diagnosis not present

## 2018-02-21 DIAGNOSIS — E1122 Type 2 diabetes mellitus with diabetic chronic kidney disease: Secondary | ICD-10-CM | POA: Diagnosis not present

## 2018-02-21 DIAGNOSIS — N183 Chronic kidney disease, stage 3 (moderate): Secondary | ICD-10-CM | POA: Insufficient documentation

## 2018-02-21 DIAGNOSIS — R079 Chest pain, unspecified: Secondary | ICD-10-CM | POA: Diagnosis not present

## 2018-02-21 DIAGNOSIS — I2510 Atherosclerotic heart disease of native coronary artery without angina pectoris: Secondary | ICD-10-CM | POA: Insufficient documentation

## 2018-02-21 DIAGNOSIS — Z7984 Long term (current) use of oral hypoglycemic drugs: Secondary | ICD-10-CM | POA: Insufficient documentation

## 2018-02-21 DIAGNOSIS — Z7982 Long term (current) use of aspirin: Secondary | ICD-10-CM | POA: Diagnosis not present

## 2018-02-21 DIAGNOSIS — Z96651 Presence of right artificial knee joint: Secondary | ICD-10-CM | POA: Diagnosis not present

## 2018-02-21 DIAGNOSIS — Z79899 Other long term (current) drug therapy: Secondary | ICD-10-CM | POA: Diagnosis not present

## 2018-02-21 DIAGNOSIS — F17200 Nicotine dependence, unspecified, uncomplicated: Secondary | ICD-10-CM | POA: Diagnosis not present

## 2018-02-21 LAB — COMPREHENSIVE METABOLIC PANEL
ALBUMIN: 3.5 g/dL (ref 3.5–5.0)
ALK PHOS: 80 U/L (ref 38–126)
ALT: 18 U/L (ref 17–63)
AST: 16 U/L (ref 15–41)
Anion gap: 8 (ref 5–15)
BILIRUBIN TOTAL: 0.5 mg/dL (ref 0.3–1.2)
BUN: 22 mg/dL — AB (ref 6–20)
CALCIUM: 9.1 mg/dL (ref 8.9–10.3)
CO2: 27 mmol/L (ref 22–32)
CREATININE: 1.2 mg/dL (ref 0.61–1.24)
Chloride: 109 mmol/L (ref 101–111)
GFR calc Af Amer: >60 mL/min (ref >60)
GFR calc non Af Amer: 58 mL/min — ABNORMAL LOW (ref >60)
GLUCOSE: 99 mg/dL (ref 65–99)
Potassium: 4.3 mmol/L (ref 3.5–5.1)
Sodium: 144 mmol/L (ref 135–145)
TOTAL PROTEIN: 5.8 g/dL — AB (ref 6.5–8.1)

## 2018-02-21 LAB — CBC
HCT: 42.4 % (ref 39.0–52.0)
HEMOGLOBIN: 13.9 g/dL (ref 13.0–17.0)
MCH: 30.3 pg (ref 26.0–34.0)
MCHC: 32.8 g/dL (ref 30.0–36.0)
MCV: 92.4 fL (ref 78.0–100.0)
Platelets: 246 10*3/uL (ref 150–400)
RBC: 4.59 MIL/uL (ref 4.22–5.81)
RDW: 13.2 % (ref 11.5–15.5)
WBC: 8.3 10*3/uL (ref 4.0–10.5)

## 2018-02-21 LAB — I-STAT TROPONIN, ED: Troponin i, poc: 0.03 ng/mL (ref 0.00–0.08)

## 2018-02-21 LAB — LIPASE, BLOOD: Lipase: 36 U/L (ref 11–51)

## 2018-02-21 NOTE — Telephone Encounter (Signed)
Having active CP, refuses to go to ER, thinks it's his gallbladder and wants an ultrasound

## 2018-02-21 NOTE — ED Triage Notes (Signed)
Pt in stating he is having chest pain for the last 6-7 weeks, pt went to cardiologist this morning and had blood work completed, called back after positive troponin and told to come to the ED.

## 2018-02-21 NOTE — Telephone Encounter (Signed)
Patient states, "I am having chest pain all the time but I think it is coming from my gallbladder." After consulting with Dr. Agustin Cree, explained to patient that he needs to follow up with his PCP regarding further testing of his gallbladder. Advised patient to come in to out office as soon as possible today for STAT troponin. Patient verbalized understanding. No further questions.

## 2018-02-21 NOTE — Discharge Instructions (Addendum)
Please return for any problem.  Follow-up with your regular doctor tomorrow as instructed.  Please follow-up with gastroenterology as well as instructed.

## 2018-02-21 NOTE — ED Provider Notes (Signed)
Patient placed in Quick Look pathway, seen and evaluated   Chief Complaint: chest pain   HPI:   Pt had an elevated troponin at med center  ROS: chest pain,  Increased belching  Physical Exam:   Gen: No distress  Neuro: Awake and Alert  Skin: Warm    Focused Exam: RRR Lungs normal respirations   Initiation of care has begun. The patient has been counseled on the process, plan, and necessity for staying for the completion/evaluation, and the remainder of the medical screening examination   Sidney Ace 02/21/18 1729    Valarie Merino, MD 02/21/18 [phone]

## 2018-02-21 NOTE — ED Provider Notes (Signed)
[facility] EMERGENCY DEPARTMENT Provider Note   CSN: [id number] Arrival date & time: 02/21/18  1631     History   Chief Complaint Chief Complaint  Patient presents with  . Abnormal Lab  . Chest Pain    HPI Thomas [name] is a 72 y.o. male.  72 year old male with prior history of diabetes, hypertension, hyperlipidemia, osteoporosis, CKD, and CAD presents with complaint of abnormal lab results.  Patient reports that he complained to his cardiologist of right-sided abdominal pain this morning.  The cardiologist obtained an outpatient troponin this a.m. of 0.07.  Patient was instructed to come to the ED for further evaluation.  Patient is without complaint of chest pain.  Of note, patient has had a recent exhaustive cardiology work-up.  He has had a troponin within the last month and a half without evidence of significant stenosis or blockage.  Patient has also had a CTA of the chest within the last month.  He is also had a right upper quadrant ultrasound with the last month.  Patient is currently without significant complaint.  He denies current chest pain or trouble breathing.  He denies any other acute symptoms.  The history is provided by the patient and medical records.  Illness  This is a recurrent problem. The current episode started more than 1 week ago. The problem occurs daily. The problem has not changed since onset.Pertinent negatives include no chest pain, no abdominal pain, no headaches and no shortness of breath. Nothing aggravates the symptoms. Nothing relieves the symptoms. He has tried nothing for the symptoms. The treatment provided significant relief.    Past Medical History:  Diagnosis Date  . Actinic keratosis 05/12/2017  . Back pain   . Depression 05/12/2017  . Diabetes ([place])   . GERD (gastroesophageal reflux disease) 05/12/2017  . Hx of colonic polyps 05/12/2017  . Hyperlipidemia 05/12/2017  . Hypertension   . 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 Low back  pain 05/12/2017  . Lumbar radiculopathy 05/12/2017  . 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 Obesity 05/12/2017  . Osteoarthritis of knee 05/12/2017  . Paresthesia 05/12/2017  . 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 Prostate nodule 05/12/2017  . Seborrheic dermatitis 05/12/2017    Patient Active Problem List   Diagnosis Date Noted  . Coronary artery disease 02/11/2018  . Claudication in peripheral vascular disease ([place]) 02/11/2018  . Atypical chest pain 01/12/2018  . Precordial chest pain 01/07/2018  . Coronary artery calcification 01/07/2018  . CKD (chronic kidney disease) stage 3, GFR 30-59 ml/min (HCC) 11/25/2017  . Chronic diastolic heart failure ([place]) 05/13/2017  . Aortic stenosis, mild 05/13/2017  . Low back pain 05/12/2017  . GERD (gastroesophageal reflux disease) 05/12/2017  . Prostate nodule 05/12/2017  . Obesity 05/12/2017  . Depression 05/12/2017  . Hx of colonic polyps 05/12/2017  . 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 Type 2 diabetes mellitus ([place] [address]) 05/12/2017  . Hyperlipidemia 05/12/2017  . Hypertensive heart disease with heart failure ([place]) 05/12/2017  . Osteoarthritis of knee 05/12/2017  . Sleep apnea 05/12/2017  . Actinic keratosis 05/12/2017  . 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 Seborrheic dermatitis 05/12/2017  . Lumbar radiculopathy 05/12/2017  . Paresthesia 05/12/2017    Past Surgical History:  Procedure Laterality Date  . HAND SURGERY    . REPLACEMENT TOTAL KNEE Right   . RIGHT/LEFT HEART CATH AND CORONARY ANGIOGRAPHY N/A 01/12/2018   Procedure: RIGHT/LEFT HEART CATH AND CORONARY ANGIOGRAPHY;  Surgeon: Belva Crome, MD;  Location: [facility];  Service: Cardiovascular;  Laterality: N/A;  . SHOULDER SURGERY Right         Home Medications    Prior to Admission medications   Medication Sig Start Date End Date Taking? Authorizing Provider  EPINEPHrine 0.3 mg/0.3 mL IJ SOAJ injection Inject 0.3 mg into the muscle daily  as needed (for anaphylaxis).    Yes [provider]  nitroGLYCERIN (NITROSTAT) 0.4 MG SL tablet Place 1 tablet (0.4 mg total) under the tongue every 5 (five) minutes as needed for chest pain. 01/11/18 04/11/18 Yes Park Liter, MD  aspirin 81 MG chewable tablet Chew 81 mg by mouth daily.    [provider]  atorvastatin (LIPITOR) 80 MG tablet Take 80 mg by mouth daily.    [provider]  DULoxetine (CYMBALTA) 20 MG capsule Take 20 mg by mouth daily.    [provider]  glipiZIDE (GLUCOTROL) 5 MG tablet Take 2.5 mg by mouth 2 (two) times daily.     [provider]  loratadine (CLARITIN) 10 MG tablet Take 10 mg by mouth daily.    [provider]  losartan (COZAAR) 25 MG tablet Take 25 mg by mouth daily.    [provider]  Meclizine HCl 25 MG CHEW Chew 25 mg by mouth 2 (two) times daily as needed (dizziness/inner ear issues.).     [provider]  meloxicam (MOBIC) 15 MG tablet Take 15 mg by mouth daily. Half a tablet twice daily    [provider]  metFORMIN (GLUCOPHAGE) 1000 MG tablet Take 1,000 mg by mouth 2 (two) times daily.     [provider]  methadone (DOLOPHINE) 5 MG tablet Take 5 mg by mouth daily.    [provider]  oxyCODONE-acetaminophen (PERCOCET/ROXICET) 5-325 MG tablet Take 1 tablet by mouth 5 (five) times daily as needed for severe pain.     [provider]  RANEXA 500 MG 12 hr tablet Take 1 tablet (500 mg total) by mouth 2 (two) times daily. 02/11/18   Park Liter, MD  sildenafil (VIAGRA) 50 MG tablet Take 25 mg by mouth daily as needed for erectile dysfunction.    [provider]  terazosin (HYTRIN) 2 MG capsule Take 2 mg by mouth at bedtime.    [provider]  torsemide (DEMADEX) 20 MG tablet Take 20 mg by mouth daily at 12 noon.     [provider]    Family History Family History  Problem Relation Age of Onset  . Hypertension  Mother   . Hypertension Father   . Alzheimer's disease Father     Social History Social History   Tobacco Use  . Smoking status: Current Every Day Smoker    Packs/day: 1.00    Years: 55.00    Pack years: 55.00  . Smokeless tobacco: Never Used  . Tobacco comment: Has cut back  Substance Use Topics  . Alcohol use: No  . Drug use: No     Allergies   Wasp venom   Review of Systems Review of Systems  Respiratory: Negative for shortness of breath.   Cardiovascular: Negative for chest pain.  Gastrointestinal: Negative for abdominal pain.  Neurological: Negative for headaches.  All other systems reviewed and are negative.    Physical Exam Updated Vital Signs BP (!) 166/90   Pulse 74   Temp 98.3 F (36.8 C) (Oral)   Resp 15   SpO2 97%   Physical Exam  Constitutional: He is oriented to person, place, and time. He appears well-developed and well-nourished. No distress.  HENT:  Head: Normocephalic and atraumatic.  Mouth/Throat: Oropharynx is clear and moist.  Eyes: Pupils are equal, round, and reactive to light. Conjunctivae and EOM are normal.  Neck: Normal range of motion. Neck supple.  Cardiovascular: Normal rate, regular rhythm and normal heart sounds.  Pulmonary/Chest: Effort normal and breath sounds normal. No respiratory distress.  Abdominal: Soft. He exhibits no distension. There is no tenderness.  Musculoskeletal: Normal range of motion. He exhibits no edema or deformity.  Neurological: He is alert and oriented to person, place, and time.  Skin: Skin is warm and dry.  Psychiatric: He has a normal mood and affect.  Nursing note and vitals reviewed.    ED Treatments / Results  Labs (all labs ordered are listed, but only abnormal results are displayed) Labs Reviewed  COMPREHENSIVE METABOLIC PANEL - Abnormal; Notable for the following components:      Result Value   BUN 22 (*)    Total Protein 5.8 (*)    GFR calc non Af Amer 58 (*)    All other  components within normal limits  CBC  LIPASE, BLOOD  I-STAT TROPONIN, ED    EKG EKG Interpretation  Date/Time:  Monday February 21 2018 17:21:22 EDT Ventricular Rate:  73 PR Interval:  162 QRS Duration: 86 QT Interval:  414 QTC Calculation: 456 R Axis:   -44 Text Interpretation:  Normal sinus rhythm Left axis deviation Septal infarct , age undetermined T wave abnormality, consider lateral ischemia Abnormal ECG Confirmed by Dene Gentry [phone]) on 02/21/2018 5:40:49 PM   Radiology Dg Chest 2 View  Result Date: 02/21/2018 CLINICAL DATA:  72 year old male with right side chest pain. Elevated troponin levels today. Smoker. EXAM: CHEST - 2 VIEW COMPARISON:  01/07/2018 and earlier, including chest CTA 01/03/2018. FINDINGS: Stable mild-to-moderate cardiomegaly. Other mediastinal contours are within normal limits. Visualized tracheal air column is within normal limits. No pneumothorax, pulmonary edema, pleural effusion or confluent pulmonary opacity. Stable lung volumes and mild increased interstitial markings. No acute osseous abnormality identified. Negative visible bowel gas pattern. IMPRESSION: No acute cardiopulmonary abnormality. Stable cardiomegaly, a small pericardial effusion was present on the April CTA. Electronically Signed   By: Genevie Ann M.D.   On: 02/21/2018 18:15    Procedures Procedures (including critical care time)  Medications Ordered in ED Medications - No data to display   Initial Impression / Assessment and Plan / ED Course  I have reviewed the triage vital signs and the nursing notes.  Pertinent labs & imaging results that were available during my care of the patient were reviewed by me and considered in my medical decision making (see chart for details).     MDM  Screen complete  Patient is presenting for evaluation of elevated troponin -0.07 -as an outpatient.  Patient is without history consistent with recent ACS.  Repeat troponin today is 0.03.  EKG is  without suggestion of acute ischemia.  Other screening labs are without significant abnormality.  Case discussed with cardiology -Dr. Liane Comber -she agrees that the patient does not require further inpatient work-up or management.  Patient declines further ED work-up or observation today.  He declines admission.  He understands need for close follow-up.  Strict return precautions are given and understood.  Final Clinical Impressions(s) / ED Diagnoses   Final diagnoses:  Troponin I above reference range    ED Discharge Orders  None       Valarie Merino, MD 02/21/18 2032

## 2018-02-21 NOTE — Telephone Encounter (Signed)
Advised patient that troponin was elevated and that Dr. Agustin Cree would like for him to go the emergency department to be evaluated. Patient verbalized understanding and states he will go to [facility]. No further questions.

## 2018-02-22 LAB — TROPONIN I: TROPONIN I: 0.07 ng/mL — AB (ref 0.00–0.04)

## 2018-02-25 ENCOUNTER — Encounter: Payer: Self-pay | Admitting: Gastroenterology

## 2018-03-02 ENCOUNTER — Encounter: Payer: Self-pay | Admitting: Physician Assistant

## 2018-03-02 ENCOUNTER — Ambulatory Visit (INDEPENDENT_AMBULATORY_CARE_PROVIDER_SITE_OTHER): Payer: Medicare Other | Admitting: Physician Assistant

## 2018-03-02 VITALS — BP 128/70 | HR 74 | Ht 71.0 in | Wt 249.4 lb

## 2018-03-02 DIAGNOSIS — R1011 Right upper quadrant pain: Secondary | ICD-10-CM | POA: Diagnosis not present

## 2018-03-02 DIAGNOSIS — M940 Chondrocostal junction syndrome [Tietze]: Secondary | ICD-10-CM | POA: Diagnosis not present

## 2018-03-02 NOTE — Progress Notes (Signed)
Reviewed and agree with management plan.  Baker Kogler T. Manie Bealer, MD FACG 

## 2018-03-02 NOTE — Progress Notes (Signed)
Subjective:    Patient ID: Thomas [name], male    DOB: 1945-09-30, 72 y.o.   MRN: [id number]  HPI Thomas [name] is a pleasant 72 year old white male, new to GI today referred by Dr. Krasowski/cardiology for evaluation of recent right upper quadrant pain. Patient has history of congestive heart failure with EF of 50%, coronary artery disease, sleep apnea, adult onset diabetes mellitus, obesity, and GERD. He underwent recent catheterization on 01/12/2018 after he had 2 ER visits with complaints of chest pain.  Cath showed diffuse atherosclerosis with 50% RCA narrowing, 40 to 50% LAD,, nothing that exceeded 60% or required intervention.  Thomas [name] Patient says his symptoms started at the end of April after he returned from a trip to [place] very He says his pain is always been on the right side under his ribs and radiates around into his side.  He described it as being knifelike and intermittent but present for prolonged periods of time when it is there.  He says over the past couple of weeks the pain has "slowed" some and is not as intense.  He has had no change in symptoms or aggravation of symptoms with p.o. intake.  He did have some nausea off and on for a while but not necessarily associated with this pain.  His appetite has been good.  He is not aware of any positional features, not aggravated by bending over lifting lying on his right side etc.  He is not aware of any injury. He does have a lot of arthritic symptoms and chronic back issues.  He is followed at the [facility] for that, by pain management.  He is on meloxicam and Roxicet chronically. He is actually scheduled for an MRI of his entire spine in a couple of weeks. Is also had prior colonoscopy through the [place] and was told that the last colonoscopy was negative and he did not require any further follow-up.  She was most recently seen by cardiology on 02/11/2018 and because of persistent complaints of the right-sided pain and his wife mentioning that he had  one episode that seemed worse after he ate pizza was scheduled for right upper quadrant ultrasound on 02/11/2018.   This showed no gallbladder stones, there were 3 apparent tiny polyps measuring from 3 to 5 mm, no gallbladder wall thickening and no ductal dilation. Most recent labs on 02/21/2018 LFTs within normal limits. He also had very recent ER visit because of a slightly elevated troponin, this was repeated by the ER after he was referred there and was negative EKG was also unremarkable.    Patient says he has never had pain that he was aware was angina.  Review of Systems Pertinent positive and negative review of systems were noted in the above HPI section.  All other review of systems was otherwise negative.  Outpatient Encounter Medications as of 03/02/2018  Medication Sig  . aspirin 81 MG chewable tablet Chew 81 mg by mouth daily.  [name] atorvastatin (LIPITOR) 80 MG tablet Take 80 mg by mouth daily.  . DULoxetine (CYMBALTA) 20 MG capsule Take 20 mg by mouth daily.  [name] EPINEPHrine 0.3 mg/0.3 mL IJ SOAJ injection Inject 0.3 mg into the muscle daily as needed (for anaphylaxis).   [name] glipiZIDE (GLUCOTROL) 5 MG tablet Take 2.5 mg by mouth 2 (two) times daily.   [name] loratadine (CLARITIN) 10 MG tablet Take 10 mg by mouth daily.  [name] losartan (COZAAR) 25 MG tablet Take 25 mg by mouth daily.  [name]  meloxicam (MOBIC) 15 MG tablet Take 15 mg by mouth daily. Half a tablet twice daily  . metFORMIN (GLUCOPHAGE) 1000 MG tablet Take 1,000 mg by mouth 2 (two) times daily.   . methadone (DOLOPHINE) 5 MG tablet Take 5 mg by mouth daily.  . nitroGLYCERIN (NITROSTAT) 0.4 MG SL tablet Place 1 tablet (0.4 mg total) under the tongue every 5 (five) minutes as needed for chest pain.  [name] oxyCODONE-acetaminophen (PERCOCET/ROXICET) 5-325 MG tablet Take 1 tablet by mouth 5 (five) times daily as needed for severe pain.   [name] terazosin (HYTRIN) 2 MG capsule Take 2 mg by mouth at bedtime.  . [DISCONTINUED] Meclizine HCl 25 MG CHEW Chew  25 mg by mouth 2 (two) times daily as needed (dizziness/inner ear issues.).   . [DISCONTINUED] RANEXA 500 MG 12 hr tablet Take 1 tablet (500 mg total) by mouth 2 (two) times daily.  . [DISCONTINUED] sildenafil (VIAGRA) 50 MG tablet Take 25 mg by mouth daily as needed for erectile dysfunction.  . [DISCONTINUED] torsemide (DEMADEX) 20 MG tablet Take 20 mg by mouth daily at 12 noon.    No facility-administered encounter medications on file as of 03/02/2018.    Allergies  Allergen Reactions  . Wasp Venom Anaphylaxis, Itching and Swelling   Patient Active Problem List   Diagnosis Date Noted  . Coronary artery disease 02/11/2018  . Claudication in peripheral vascular disease ([place]) 02/11/2018  . Atypical chest pain 01/12/2018  . Precordial chest pain 01/07/2018  . Coronary artery calcification 01/07/2018  . CKD (chronic kidney disease) stage 3, GFR 30-59 ml/min (HCC) 11/25/2017  . Chronic diastolic heart failure ([place]) 05/13/2017  . Aortic stenosis, mild 05/13/2017  . Low back pain 05/12/2017  . GERD (gastroesophageal reflux disease) 05/12/2017  . Prostate nodule 05/12/2017  . Obesity 05/12/2017  . Depression 05/12/2017  . Hx of colonic polyps 05/12/2017  . 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 Type 2 diabetes mellitus ([place]) 05/12/2017  . Hyperlipidemia 05/12/2017  . Hypertensive heart disease with heart failure ([place]) 05/12/2017  . Osteoarthritis of knee 05/12/2017  . Sleep apnea 05/12/2017  . Actinic keratosis 05/12/2017  . 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 Seborrheic dermatitis 05/12/2017  . Paresthesia 05/12/2017   Social History   Socioeconomic History  . Marital status: Married    Spouse name: Not on file  . Number of children: Not on file  . Years of education: Not on file  . Highest education level: Not on file  Occupational History  . Not on file  Social Needs  . Financial resource strain: Not on file  . Food insecurity:    Worry:  Not on file    Inability: Not on file  . Transportation needs:    Medical: Not on file    Non-medical: Not on file  Tobacco Use  . Smoking status: Current Every Day Smoker    Packs/day: 1.00    Years: 55.00    Pack years: 55.00  . Smokeless tobacco: Never Used  . Tobacco comment: Has cut back  Substance and Sexual Activity  . Alcohol use: No  . Drug use: No  . Sexual activity: Never    Birth control/protection: None  Lifestyle  . Physical activity:    Days per week: Not on file    Minutes per session: Not on file  . Stress: Not on file  Relationships  . Social connections:    Talks on phone: Not on file    Gets together:  Not on file    Attends religious service: Not on file    Active member of club or organization: Not on file    Attends meetings of clubs or organizations: Not on file    Relationship status: Not on file  . Intimate partner violence:    Fear of current or ex partner: Not on file    Emotionally abused: Not on file    Physically abused: Not on file    Forced sexual activity: Not on file  Other Topics Concern  . Not on file  Social History Narrative  . Not on file    Mr. Rog family history includes Alzheimer's disease in his father; Hypertension in his father and mother.      Objective:    Vitals:   03/02/18 1337  BP: 128/70  Pulse: 74    Physical Exam; well-developed elderly white male in no acute distress, pleasant accompanied by his wife.  Blood pressure 128/70 pulse 74, height 5 foot 11, weight 249, BMI 34.7.  HEENT; nontraumatic normocephalic EOMI PERRLA sclera anicteric, Oropharynx clear, Cardiovascular; regular rate and rhythm with S1-S2, Pulmonary; clear bilaterally, Abdomen; obese, he is tender over the anterior lower ribs and rib margin on the right also over the xiphoid process, he has tenderness extending around laterally rectally over the lower right ribs.  There is some point tenderness anteriorly over the costal margin, No palpable  hepatosplenomegaly, no real abdominal tenderness, no guarding or rebound bowel sounds are present no bruit, Rectal; exam not done, Extremities ;no clubbing cyanosis or edema skin warm and dry, Neuro psych ;mood and affect appropriate, grossly nonfocal, alert and oriented.       Assessment & Plan:   #87 72 year old white male with several week history of right upper quadrant pain radiating around laterally towards the back described as knifelike at times and actually improving over the past couple of weeks. On exam this is clearly musculoskeletal pain and consistent with costochondritis with point tenderness over the lower anterior rib margin. Doubt symptomatic gallbladder disease, no gallstones wall thickening or ductal dilation on recent ultrasound.  #2   3 to 5 mm gallbladder polyps, incidental #3 diffuse coronary artery disease-nonocclusive-recent cardiac cath done 01/12/2018 for complaints of right upper quadrant and lower chest pain as outlined above #4 sleep apnea #5.  Obesity #6.  Adult onset diabetes mellitus #7.  Chronic back pain/degenerative disc disease followed [facility]  Plan; Long discussion with the patient and his wife today, his pain is musculoskeletal and consistent with costochondritis and appears to be improving. Suggested he try lidocaine patches OTC for 12 hours a day directly over the area of pain along the lower anterior right costal margin.  He also has a TENS type machine at home and may use that if he finds helpful. He  has follow-up in July with his pain management specialist at the [facility], and may benefit from a trigger point type injection if continuing to have significant discomfort at that time He will proceed with MRI of the spine, I do not think his pain is radicular. He should have follow-up ultrasound in 12 months to reassess for stability of the tiny gallbladder polyps. I am happy to follow-up after he has MRI of the spine, and/or if his pain persists  and/or changes we can do further GI evaluation.    Jaelie Aguilera Genia Harold PA-C 03/02/2018   Cc: [name], MD

## 2018-03-02 NOTE — Patient Instructions (Signed)
Get the Salonpas with 5 % Lidocaine patches. Put the patch directly over the area of pain- Apply before bed if needed.  Costochronditis is the condition.  Consider tirigger point Lidocaine /Steroid injection.   Follow up with Nicoletta Ba PA as needed.   If you are age 74 or older, your body mass index should be between 23-30. Your Body mass index is 34.78 kg/m. If this is out of the aforementioned range listed, please consider follow up with your Primary Care Provider.

## 2018-03-07 ENCOUNTER — Ambulatory Visit (HOSPITAL_BASED_OUTPATIENT_CLINIC_OR_DEPARTMENT_OTHER)
Admission: RE | Admit: 2018-03-07 | Discharge: 2018-03-07 | Disposition: A | Discharge status: Home / Self Care | Payer: Medicare Other | Source: Ambulatory Visit | Attending: Cardiology | Admitting: Cardiology

## 2018-03-07 DIAGNOSIS — I739 Peripheral vascular disease, unspecified: Secondary | ICD-10-CM | POA: Diagnosis not present

## 2018-03-07 DIAGNOSIS — I2510 Atherosclerotic heart disease of native coronary artery without angina pectoris: Secondary | ICD-10-CM | POA: Diagnosis not present

## 2018-03-07 DIAGNOSIS — E785 Hyperlipidemia, unspecified: Secondary | ICD-10-CM | POA: Diagnosis not present

## 2018-03-07 DIAGNOSIS — I70203 Unspecified atherosclerosis of native arteries of extremities, bilateral legs: Secondary | ICD-10-CM | POA: Insufficient documentation

## 2018-03-07 DIAGNOSIS — E1151 Type 2 diabetes mellitus with diabetic peripheral angiopathy without gangrene: Secondary | ICD-10-CM | POA: Insufficient documentation

## 2018-03-07 NOTE — Progress Notes (Signed)
Lower extremity arterial doppler performed bilaterally     Findings are characteristic of 75-99% stenosis in the SFA distal bilaterally, velocity is under estimated to collateral flow.    03/07/2018 Cardell Peach

## 2018-03-18 ENCOUNTER — Encounter: Payer: Self-pay | Admitting: Cardiology

## 2018-03-18 ENCOUNTER — Ambulatory Visit (INDEPENDENT_AMBULATORY_CARE_PROVIDER_SITE_OTHER): Payer: Medicare Other | Admitting: Cardiology

## 2018-03-18 VITALS — BP 152/78 | HR 71 | Ht 71.0 in | Wt 248.0 lb

## 2018-03-18 DIAGNOSIS — I5032 Chronic diastolic (congestive) heart failure: Secondary | ICD-10-CM

## 2018-03-18 DIAGNOSIS — I739 Peripheral vascular disease, unspecified: Secondary | ICD-10-CM

## 2018-03-18 DIAGNOSIS — R0789 Other chest pain: Secondary | ICD-10-CM

## 2018-03-18 DIAGNOSIS — R1011 Right upper quadrant pain: Secondary | ICD-10-CM | POA: Diagnosis not present

## 2018-03-18 DIAGNOSIS — I2510 Atherosclerotic heart disease of native coronary artery without angina pectoris: Secondary | ICD-10-CM

## 2018-03-18 NOTE — Progress Notes (Signed)
Cardiology Office Note:    Date:  03/18/2018   ID:  Thomas [name], DOB October 13, 1945, MRN [id number]  PCP:  [name], MD  Cardiologist:  [name], MD    Referring MD: [name], MD   No chief complaint on file. Still complaining having a lot of pain including pain in his legs in his right upper portion of the abdomen.  History of Present Illness:    Thomas [name] is a 72 y.o. male with nonobstructive coronary artery disease recently we also did ultrasounds of his lower extremities to look for any arterial blockages likely he does not have any critical blockages nonobstructive [place] or sooner to coronary artery disease still complained of having a lot of problems still complaining of having a lot of pain is seeing pain specialist complain of having pain in the right upper quadrant.  His gallbladder ultrasound showed some polyp I will schedule him to have HIDA scan.  In my opinion he does have neuropathy because of long-standing diabetes as well as some chronic back problem he may benefit from some medication targeting that problem.  I will leave it up to the discretion of his spine specialist.  Past Medical History:  Diagnosis Date  . Actinic keratosis 05/12/2017  . Back pain   . Depression 05/12/2017  . Diabetes ([place])   . GERD (gastroesophageal reflux disease) 05/12/2017  . Hx of colonic polyps 05/12/2017  . Hyperlipidemia 05/12/2017  . Hypertension   . 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 Low back pain 05/12/2017  . Lumbar radiculopathy 05/12/2017  . 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 Obesity 05/12/2017  . Osteoarthritis of knee 05/12/2017  . Paresthesia 05/12/2017  . 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 Prostate nodule 05/12/2017  . Seborrheic dermatitis 05/12/2017    Past Surgical History:  Procedure Laterality Date  . HAND SURGERY    . REPLACEMENT TOTAL KNEE Right   . RIGHT/LEFT HEART CATH AND CORONARY ANGIOGRAPHY N/A 01/12/2018   Procedure: RIGHT/LEFT HEART CATH AND CORONARY  ANGIOGRAPHY;  Surgeon: Belva Crome, MD;  Location: [facility];  Service: Cardiovascular;  Laterality: N/A;  . SHOULDER SURGERY Right     Current Medications: Current Meds  Medication Sig  . aspirin 81 MG chewable tablet Chew 81 mg by mouth daily.  [name] atorvastatin (LIPITOR) 80 MG tablet Take 80 mg by mouth daily.  . DULoxetine (CYMBALTA) 20 MG capsule Take 20 mg by mouth daily.  [name] EPINEPHrine 0.3 mg/0.3 mL IJ SOAJ injection Inject 0.3 mg into the muscle daily as needed (for anaphylaxis).   [name] glipiZIDE (GLUCOTROL) 5 MG tablet Take 2.5 mg by mouth 2 (two) times daily.   [name] loratadine (CLARITIN) 10 MG tablet Take 10 mg by mouth daily.  [name] losartan (COZAAR) 25 MG tablet Take 25 mg by mouth daily.  . meloxicam (MOBIC) 15 MG tablet Take 15 mg by mouth daily. Half a tablet twice daily  . metFORMIN (GLUCOPHAGE) 1000 MG tablet Take 1,000 mg by mouth 2 (two) times daily.   . methadone (DOLOPHINE) 5 MG tablet Take 5 mg by mouth daily.  . nitroGLYCERIN (NITROSTAT) 0.4 MG SL tablet Place 1 tablet (0.4 mg total) under the tongue every 5 (five) minutes as needed for chest pain.  [name] oxyCODONE-acetaminophen (PERCOCET/ROXICET) 5-325 MG tablet Take 1 tablet by mouth 5 (five) times daily as needed for severe pain.   [name] terazosin (HYTRIN) 2 MG capsule Take 2 mg by mouth at bedtime.     Allergies:  Wasp venom   Social History   Socioeconomic History  . Marital status: Married    Spouse name: Not on file  . Number of children: Not on file  . Years of education: Not on file  . Highest education level: Not on file  Occupational History  . Not on file  Social Needs  . Financial resource strain: Not on file  . Food insecurity:    Worry: Not on file    Inability: Not on file  . Transportation needs:    Medical: Not on file    Non-medical: Not on file  Tobacco Use  . Smoking status: Current Every Day Smoker    Packs/day: 1.00    Years: 55.00    Pack years: 55.00  . Smokeless tobacco: Never  Used  . Tobacco comment: Has cut back  Substance and Sexual Activity  . Alcohol use: No  . Drug use: No  . Sexual activity: Never    Birth control/protection: None  Lifestyle  . Physical activity:    Days per week: Not on file    Minutes per session: Not on file  . Stress: Not on file  Relationships  . Social connections:    Talks on phone: Not on file    Gets together: Not on file    Attends religious service: Not on file    Active member of club or organization: Not on file    Attends meetings of clubs or organizations: Not on file    Relationship status: Not on file  Other Topics Concern  . Not on file  Social History Narrative  . Not on file     Family History: The patient's family history includes Alzheimer's disease in his father; Hypertension in his father and mother. ROS:   Please see the history of present illness.    All 14 point review of systems negative except as described per history of present illness  EKGs/Labs/Other Studies Reviewed:      Recent Labs: 05/14/2017: BNP 212.0 02/21/2018: ALT 18; BUN 22; Creatinine, Ser 1.20; Hemoglobin 13.9; Platelets 246; Potassium 4.3; Sodium 144  Recent Lipid Panel No results found for: CHOL, TRIG, HDL, CHOLHDL, VLDL, LDLCALC, LDLDIRECT  Physical Exam:    VS:  BP (!) 152/78 (BP Location: Right Arm, Patient Position: Sitting, Cuff Size: Normal)   Pulse 71   Ht _0  (1.803 m)   Wt 248 lb (112.5 kg)   SpO2 98%   BMI 34.59 kg/m     Wt Readings from Last 3 Encounters:  03/18/18 248 lb (112.5 kg)  03/02/18 249 lb 6 oz (113.1 kg)  02/11/18 246 lb 1.9 oz (111.6 kg)     GEN:  Well nourished, well developed in no acute distress HEENT: Normal NECK: No JVD; No carotid bruits LYMPHATICS: No lymphadenopathy CARDIAC: RRR, no murmurs, no rubs, no gallops RESPIRATORY:  Clear to auscultation without rales, wheezing or rhonchi  ABDOMEN: Soft, non-tender, non-distended MUSCULOSKELETAL:  No edema; No deformity  SKIN: Warm  and dry LOWER EXTREMITIES: no swelling NEUROLOGIC:  Alert and oriented x 3 PSYCHIATRIC:  Normal affect   ASSESSMENT:    1. Coronary artery disease involving native coronary artery of native heart without angina pectoris   2. Chronic diastolic heart failure ([place])   3. Atypical chest pain   4. Claudication in peripheral vascular disease ([place])    PLAN:    In order of problems listed above:  1. Coronary artery disease nonobstructive on appropriate medications the key is to  reduce his risk factors for coronary artery disease that we trying to do. Chronic diastolic congestive heart failure doing well compensated. Atypical chest pain doing fair from that point review followed by pulmonary clinic.  I will schedule him to have a HIDA scan to make sure he does not have any significant gallbladder problem Claudications that this is most likely neuropathic pain.  Arterial study of lower extremities did not show critical stenosis.  See him back in 3 months sooner if he get a problem   Medication Adjustments/Labs and Tests Ordered: Current medicines are reviewed at length with the patient today.  Concerns regarding medicines are outlined above.  No orders of the defined types were placed in this encounter.  Medication changes: No orders of the defined types were placed in this encounter.   Signed, Park Liter, MD, [facility] 03/18/2018 8:54 AM    [place]

## 2018-03-18 NOTE — Patient Instructions (Signed)
Medication Instructions:  Your physician recommends that you continue on your current medications as directed. Please refer to the Current Medication list given to you today.  Labwork: None  Testing/Procedures: Hida scan has been ordered at [facility], we will call you with an appointment date and time once insurance has approved.   Follow-Up: Your physician recommends that you schedule a follow-up appointment in: 3 months  Any Other Special Instructions Will Be Listed Below (If Applicable).     If you need a refill on your cardiac medications before your next appointment, please call your pharmacy.   [place], RN, BSN

## 2018-03-21 DIAGNOSIS — R109 Unspecified abdominal pain: Secondary | ICD-10-CM | POA: Diagnosis not present

## 2018-03-21 DIAGNOSIS — R1011 Right upper quadrant pain: Secondary | ICD-10-CM | POA: Diagnosis not present

## 2018-03-23 ENCOUNTER — Telehealth: Payer: Self-pay | Admitting: *Deleted

## 2018-03-23 NOTE — Telephone Encounter (Signed)
Patient informed of HIDA scan results. Patient verbalized understanding. No further questions.

## 2018-03-30 DIAGNOSIS — M79672 Pain in left foot: Secondary | ICD-10-CM | POA: Diagnosis not present

## 2018-03-30 DIAGNOSIS — B351 Tinea unguium: Secondary | ICD-10-CM | POA: Diagnosis not present

## 2018-03-30 DIAGNOSIS — M79671 Pain in right foot: Secondary | ICD-10-CM | POA: Diagnosis not present

## 2018-03-30 DIAGNOSIS — E119 Type 2 diabetes mellitus without complications: Secondary | ICD-10-CM | POA: Diagnosis not present

## 2018-04-27 ENCOUNTER — Ambulatory Visit: Payer: Medicare Other | Admitting: Gastroenterology

## 2018-04-27 ENCOUNTER — Telehealth: Payer: Self-pay | Admitting: Physician Assistant

## 2018-04-27 NOTE — Telephone Encounter (Signed)
Spoke with the spouse. She and the patient wanted to confirm the plan of care based on the findings at his last office visit. Reviewed the plan as states in his visit note. She will have the patient communicate with his pain management doctor.

## 2018-06-29 DIAGNOSIS — E119 Type 2 diabetes mellitus without complications: Secondary | ICD-10-CM | POA: Diagnosis not present

## 2018-06-29 DIAGNOSIS — B351 Tinea unguium: Secondary | ICD-10-CM | POA: Diagnosis not present

## 2018-06-29 DIAGNOSIS — M79671 Pain in right foot: Secondary | ICD-10-CM | POA: Diagnosis not present

## 2018-06-29 DIAGNOSIS — M79672 Pain in left foot: Secondary | ICD-10-CM | POA: Diagnosis not present

## 2018-09-05 DIAGNOSIS — B351 Tinea unguium: Secondary | ICD-10-CM | POA: Diagnosis not present

## 2018-09-05 DIAGNOSIS — M79672 Pain in left foot: Secondary | ICD-10-CM | POA: Diagnosis not present

## 2018-09-05 DIAGNOSIS — M79671 Pain in right foot: Secondary | ICD-10-CM | POA: Diagnosis not present

## 2018-09-05 DIAGNOSIS — E119 Type 2 diabetes mellitus without complications: Secondary | ICD-10-CM | POA: Diagnosis not present

## 2018-10-16 IMAGING — US US ABDOMEN LIMITED
1 series · 14 of 25 positions shown · non-contrast
Comparison: CT abdomen and pelvis December 19, 2014

CLINICAL DATA: Right upper quadrant pain with nausea

EXAM:
ULTRASOUND ABDOMEN LIMITED RIGHT UPPER QUADRANT

[Series 1: us abdomen limited · 0.21mm/px · 14 of 51 slices shown]
[im 1/51]
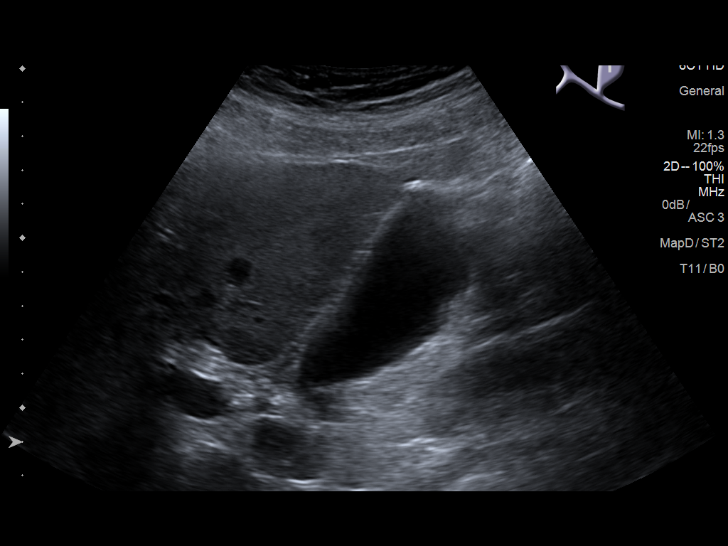
[im 5/51]
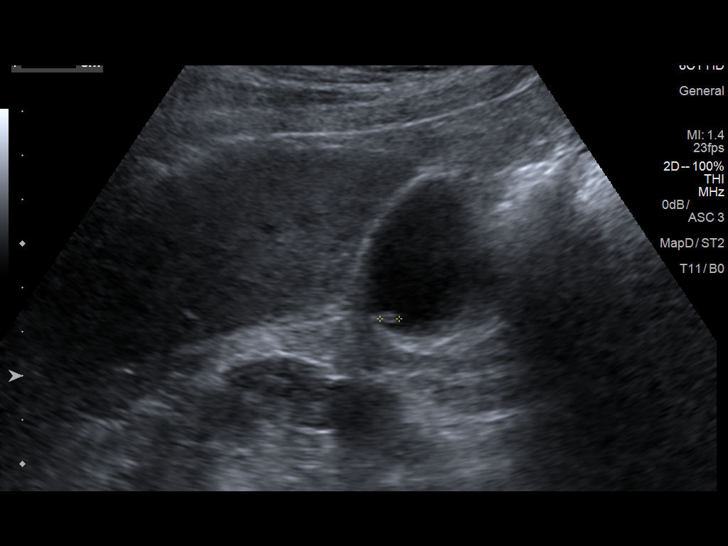
[im 9/51]
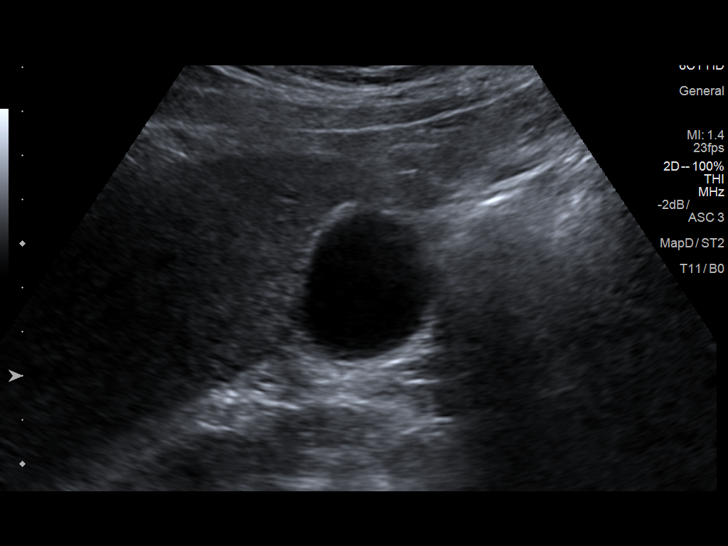
[im 13/51]
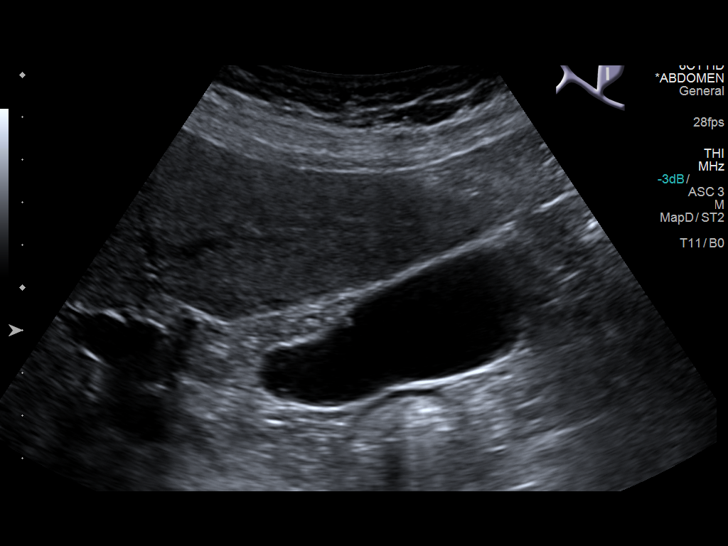
[im 17/51]
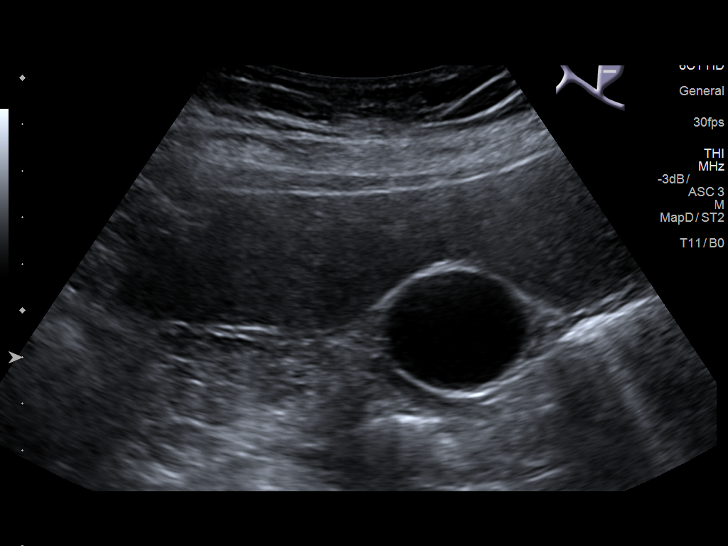
[im 19/51]
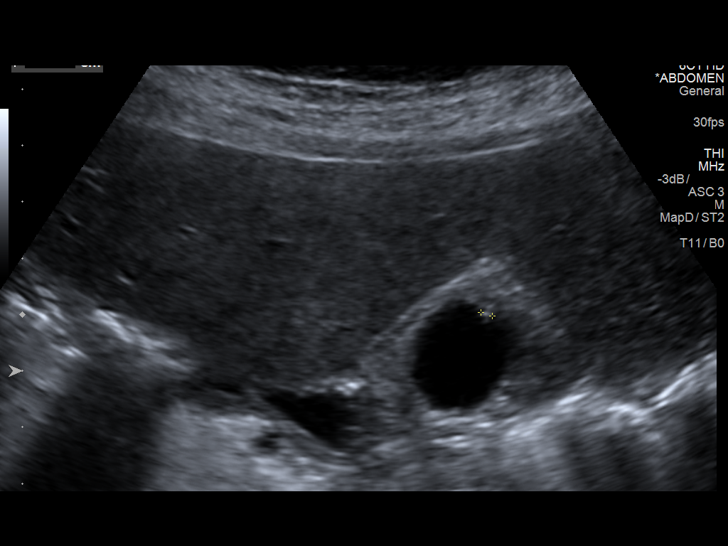
[im 23/51]
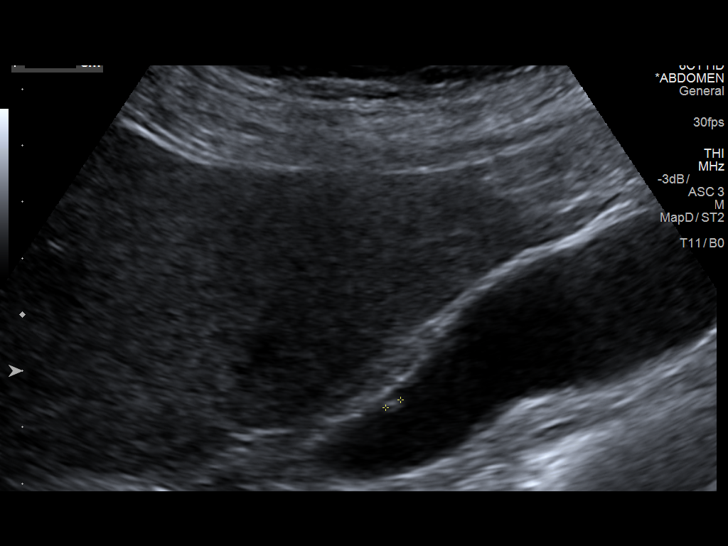
[im 28/51]
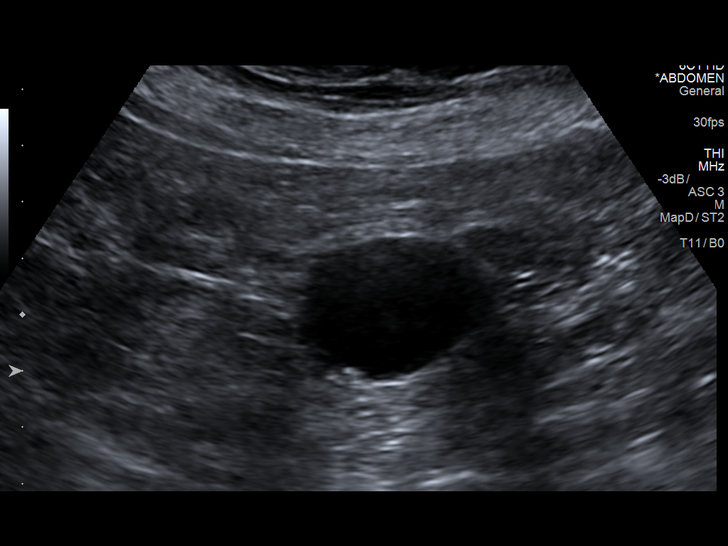
[im 32/51]
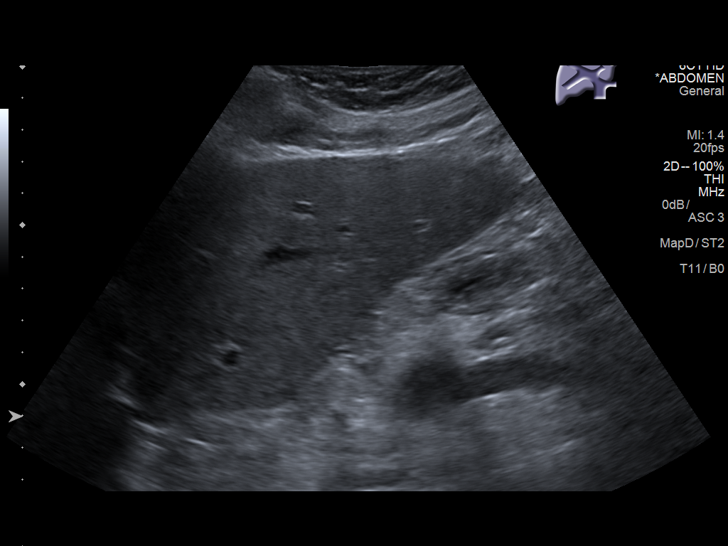
[im 34/51]
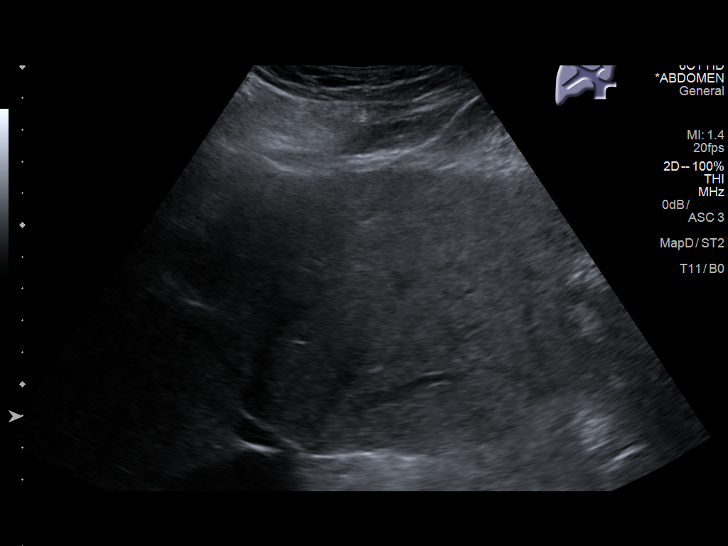
[im 38/51]
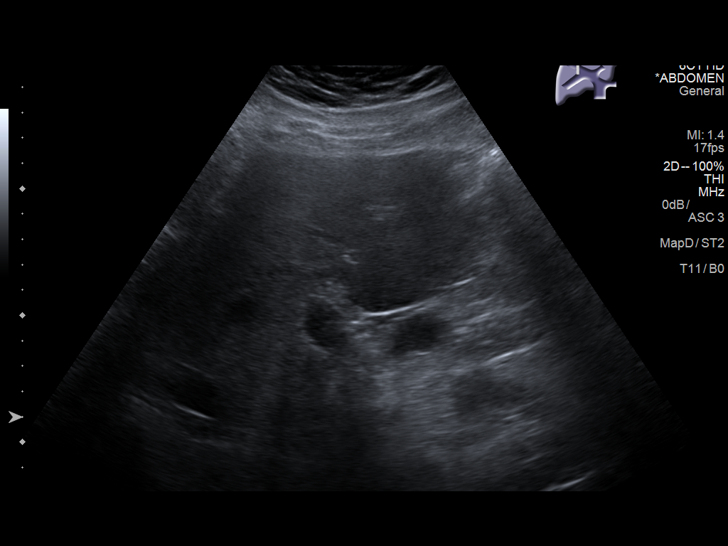
[im 42/51]
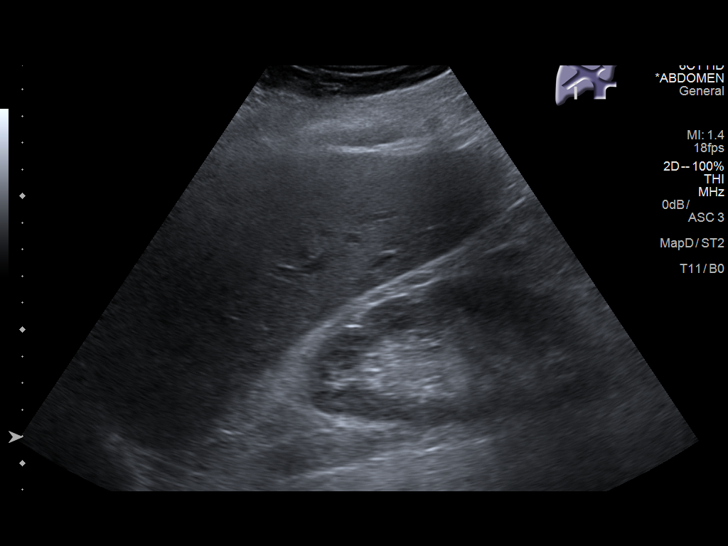
[im 46/51]
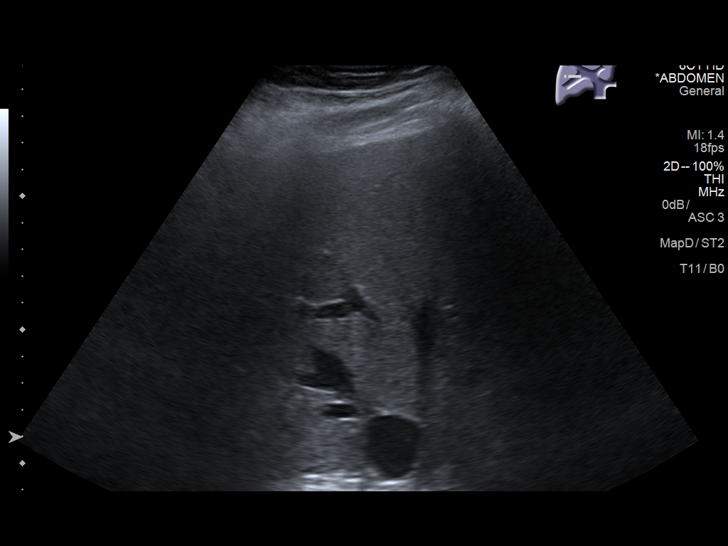
[im 51/51]
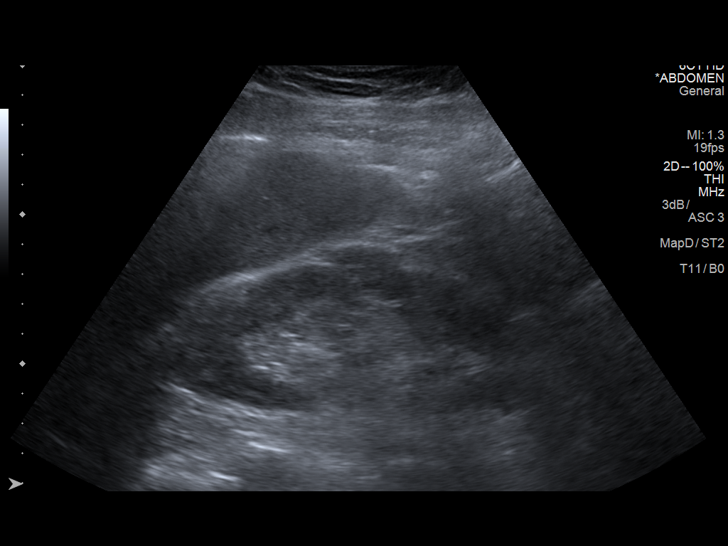

[14 of 25 positions shown; findings below may reference images not displayed]

FINDINGS: Gallbladder:

Within the gallbladder, there are 3 echogenic foci which neither
move nor shadow. The largest of these apparent polyps measures 5 mm
in length. Other polyps measures 3 mm in length. There are no
echogenic foci in the gallbladder which move and shadow as is
expected with gallstones. There is no gallbladder wall thickening or
pericholecystic fluid. No sonographic Murphy sign noted by
sonographer.

Common bile duct:

Diameter: 2 mm. No intrahepatic or extrahepatic biliary duct
dilatation.

Liver:

No focal lesion identified. Within normal limits in parenchymal
echogenicity. Portal vein is patent on color Doppler imaging with
normal direction of blood flow towards the liver.
IMPRESSION: Three small polyps in the gallbladder, largest measuring 5 mm in
length. Polyps of this size do not warrant additional imaging
surveillance per consensus guidelines. No gallstones or gallbladder
wall thickening evident.

Study otherwise unremarkable.

## 2018-11-11 DIAGNOSIS — M79672 Pain in left foot: Secondary | ICD-10-CM | POA: Diagnosis not present

## 2018-11-11 DIAGNOSIS — B351 Tinea unguium: Secondary | ICD-10-CM | POA: Diagnosis not present

## 2018-11-11 DIAGNOSIS — E119 Type 2 diabetes mellitus without complications: Secondary | ICD-10-CM | POA: Diagnosis not present

## 2018-11-11 DIAGNOSIS — M79671 Pain in right foot: Secondary | ICD-10-CM | POA: Diagnosis not present

## 2019-01-13 DIAGNOSIS — E119 Type 2 diabetes mellitus without complications: Secondary | ICD-10-CM | POA: Diagnosis not present

## 2019-01-13 DIAGNOSIS — B351 Tinea unguium: Secondary | ICD-10-CM | POA: Diagnosis not present

## 2019-01-13 DIAGNOSIS — M79672 Pain in left foot: Secondary | ICD-10-CM | POA: Diagnosis not present

## 2019-01-13 DIAGNOSIS — M79671 Pain in right foot: Secondary | ICD-10-CM | POA: Diagnosis not present

## 2019-03-17 DIAGNOSIS — M79671 Pain in right foot: Secondary | ICD-10-CM | POA: Diagnosis not present

## 2019-03-17 DIAGNOSIS — B351 Tinea unguium: Secondary | ICD-10-CM | POA: Diagnosis not present

## 2019-03-17 DIAGNOSIS — E119 Type 2 diabetes mellitus without complications: Secondary | ICD-10-CM | POA: Diagnosis not present

## 2019-03-17 DIAGNOSIS — M79672 Pain in left foot: Secondary | ICD-10-CM | POA: Diagnosis not present

## 2019-05-19 DIAGNOSIS — M79671 Pain in right foot: Secondary | ICD-10-CM | POA: Diagnosis not present

## 2019-05-19 DIAGNOSIS — B351 Tinea unguium: Secondary | ICD-10-CM | POA: Diagnosis not present

## 2019-05-19 DIAGNOSIS — M79672 Pain in left foot: Secondary | ICD-10-CM | POA: Diagnosis not present

## 2019-05-19 DIAGNOSIS — E119 Type 2 diabetes mellitus without complications: Secondary | ICD-10-CM | POA: Diagnosis not present

## 2019-05-21 ENCOUNTER — Emergency Department (HOSPITAL_BASED_OUTPATIENT_CLINIC_OR_DEPARTMENT_OTHER)
Admission: EM | Admit: 2019-05-21 | Discharge: 2019-05-21 | Disposition: A | Discharge status: Home / Self Care | Payer: Medicare Other | Attending: Emergency Medicine | Admitting: Emergency Medicine

## 2019-05-21 ENCOUNTER — Other Ambulatory Visit: Payer: Self-pay

## 2019-05-21 ENCOUNTER — Encounter (HOSPITAL_BASED_OUTPATIENT_CLINIC_OR_DEPARTMENT_OTHER): Payer: Self-pay

## 2019-05-21 DIAGNOSIS — H02831 Dermatochalasis of right upper eyelid: Secondary | ICD-10-CM | POA: Diagnosis not present

## 2019-05-21 DIAGNOSIS — E785 Hyperlipidemia, unspecified: Secondary | ICD-10-CM | POA: Insufficient documentation

## 2019-05-21 DIAGNOSIS — H43811 Vitreous degeneration, right eye: Secondary | ICD-10-CM | POA: Diagnosis not present

## 2019-05-21 DIAGNOSIS — H25041 Posterior subcapsular polar age-related cataract, right eye: Secondary | ICD-10-CM | POA: Diagnosis not present

## 2019-05-21 DIAGNOSIS — H2513 Age-related nuclear cataract, bilateral: Secondary | ICD-10-CM | POA: Diagnosis not present

## 2019-05-21 DIAGNOSIS — F17210 Nicotine dependence, cigarettes, uncomplicated: Secondary | ICD-10-CM | POA: Insufficient documentation

## 2019-05-21 DIAGNOSIS — I10 Essential (primary) hypertension: Secondary | ICD-10-CM | POA: Insufficient documentation

## 2019-05-21 DIAGNOSIS — H539 Unspecified visual disturbance: Secondary | ICD-10-CM | POA: Insufficient documentation

## 2019-05-21 DIAGNOSIS — E119 Type 2 diabetes mellitus without complications: Secondary | ICD-10-CM | POA: Insufficient documentation

## 2019-05-21 MED ORDER — TETRACAINE HCL 0.5 % OP SOLN
2.0000 [drp] | Freq: Once | OPHTHALMIC | Status: AC
  Administered 2019-05-21: 2 [drp] via OPHTHALMIC
  Filled 2019-05-21: qty 4

## 2019-05-21 MED ORDER — FLUORESCEIN SODIUM 1 MG OP STRP
1.0000 | ORAL_STRIP | Freq: Once | OPHTHALMIC | Status: AC
  Administered 2019-05-21: 1 via OPHTHALMIC
  Filled 2019-05-21: qty 1

## 2019-05-21 NOTE — ED Triage Notes (Addendum)
Pt states he had altered vision in his R eye starting 9/11 afternoon. Pt reports seeing lines of of various colors and states it is like looking through cellophane. Denies pain.

## 2019-05-21 NOTE — ED Provider Notes (Signed)
Emergency Department Provider Note   I have reviewed the triage vital signs and the nursing notes.   HISTORY  Chief Complaint Eye Problem   HPI Thomas [name] is a 73 y.o. male presents to the ED with right eye vision change starting 3 days prior. He reports "flashes" and "lines" in the vision. No pain. No HA. Vision change appears to be only on the right. No weakness/numbness. No contact lenses. No curtain like loss of vision. No radiation of symptoms or modifying factors.    Past Medical History:  Diagnosis Date  . Actinic keratosis 05/12/2017  . Back pain   . Depression 05/12/2017  . Diabetes ([place])   . GERD (gastroesophageal reflux disease) 05/12/2017  . Hx of colonic polyps 05/12/2017  . Hyperlipidemia 05/12/2017  . Hypertension   . 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 Low back pain 05/12/2017  . Lumbar radiculopathy 05/12/2017  . 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 Obesity 05/12/2017  . Osteoarthritis of knee 05/12/2017  . Paresthesia 05/12/2017  . 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 Prostate nodule 05/12/2017  . Seborrheic dermatitis 05/12/2017    Patient Active Problem List   Diagnosis Date Noted  . Coronary artery disease 02/11/2018  . Claudication in peripheral vascular disease ([place]) 02/11/2018  . Atypical chest pain 01/12/2018  . Precordial chest pain 01/07/2018  . Coronary artery calcification 01/07/2018  . CKD (chronic kidney disease) stage 3, GFR 30-59 ml/min ([facility]) 11/25/2017  . Chronic diastolic heart failure ([place]) 05/13/2017  . Aortic stenosis, mild 05/13/2017  . Low back pain 05/12/2017  . GERD (gastroesophageal reflux disease) 05/12/2017  . Prostate nodule 05/12/2017  . Obesity 05/12/2017  . Depression 05/12/2017  . Hx of colonic polyps 05/12/2017  . 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 Type 2 diabetes mellitus ([place]) 05/12/2017  . Hyperlipidemia 05/12/2017  . Hypertensive heart disease with heart failure ([place]) 05/12/2017  . Osteoarthritis of knee 05/12/2017  . Sleep apnea  05/12/2017  . Actinic keratosis 05/12/2017  . 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 Seborrheic dermatitis 05/12/2017  . Paresthesia 05/12/2017    Past Surgical History:  Procedure Laterality Date  . HAND SURGERY    . REPLACEMENT TOTAL KNEE Right   . RIGHT/LEFT HEART CATH AND CORONARY ANGIOGRAPHY N/A 01/12/2018   Procedure: RIGHT/LEFT HEART CATH AND CORONARY ANGIOGRAPHY;  Surgeon: Belva Crome, MD;  Location: [facility];  Service: Cardiovascular;  Laterality: N/A;  . SHOULDER SURGERY Right     Allergies Wasp venom  Family History  Problem Relation Age of Onset  . Hypertension Mother   . Hypertension Father   . Alzheimer's disease Father     Social History Social History   Tobacco Use  . Smoking status: Current Every Day Smoker    Packs/day: 1.00    Years: 55.00    Pack years: 55.00  . Smokeless tobacco: Never Used  . Tobacco comment: Has cut back  Substance Use Topics  . Alcohol use: No  . Drug use: No    Review of Systems  Constitutional: No fever/chills Eyes: Positive vision change.  ENT: No sore throat. Cardiovascular: Denies chest pain. Respiratory: Denies shortness of breath. Gastrointestinal: No abdominal pain.  No nausea, no vomiting.  No diarrhea.  No constipation. Genitourinary: Negative for dysuria. Musculoskeletal: Negative for back pain. Skin: Negative for rash. Neurological: Negative for headaches, focal weakness or numbness.  10-point ROS otherwise negative.  ____________________________________________   PHYSICAL EXAM:  VITAL SIGNS: ED Triage Vitals  BP: 146/87 Pulse: 89 RR: 18 Temp: 98.59F  SpO2: 98%   Constitutional: Alert and oriented. Well appearing and in no acute distress. Eyes: Conjunctivae are normal. PERRL. EOMI. Fundi poorly visualized.  Head: Atraumatic.  Nose: No congestion/rhinnorhea. Mouth/Throat: Mucous membranes are moist.  Neck: No stridor.   Cardiovascular: Normal  rate, regular rhythm. Good peripheral circulation. Grossly normal heart sounds.   Respiratory: Normal respiratory effort.  No retractions. Lungs CTAB. Gastrointestinal: Soft and nontender. No distention.  Musculoskeletal: No lower extremity tenderness nor edema. No gross deformities of extremities. Neurologic:  Normal speech and language. No gross focal neurologic deficits are appreciated.  Skin:  Skin is warm, dry and intact. No rash noted.  ____________________________________________   PROCEDURES  Procedure(s) performed:   Procedures  Ultrasound: Limited Ocular  Performed and interpreted by Dr Laverta Baltimore Indication: Vision change  Using high frequency linear probe, ultrasound of the globe was performed in real time in two planes with patient looking left and right if able Interpretation: No retinal detachment visualized.  Lens was in proper location.  No hemorrhage appreciated.  Images were electronically archived.     ____________________________________________   INITIAL IMPRESSION / ASSESSMENT AND PLAN / ED COURSE  Pertinent labs & imaging results that were available during my care of the patient were reviewed by me and considered in my medical decision making (see chart for details).   Patient presents to the ED with right eye vision change. Normal [place]. Spoke with Dr. Eulas Post with ophthalmology. Plans to see the patient in his office at 6:30 PM today. Discharged with wife to drive patient. Address given and Dr. Eulas Post to f/u with patient by phone. Discussed ED return precautions.   ____________________________________________  FINAL CLINICAL IMPRESSION(S) / ED DIAGNOSES  Final diagnoses:  Vision changes     MEDICATIONS GIVEN DURING THIS VISIT:  Medications  tetracaine (PONTOCAINE) 0.5 % ophthalmic solution 2 drop (2 drops Right Eye Given by Other 05/21/19 1525)  fluorescein ophthalmic strip 1 strip (1 strip Right Eye Given 05/21/19 1525)    Note:  This document was  prepared using Dragon voice recognition software and may include unintentional dictation errors.  Nanda Quinton, MD Emergency Medicine    Tykwon Fera, [facility], MD 05/23/19 Lurena Nida

## 2019-05-21 NOTE — Discharge Instructions (Signed)
You were seen in the emergency department today with vision change.  I spoke with the ophthalmologist who will evaluate you in their office today.  Their office is in [place].  I have listed the address here and have given them your cell phone number.  Your appointment will be at 6:30 PM.  The ophthalmologist tells me that this is a medical office building and that if you go around the back of the building you will see their suite number.

## 2019-05-31 DIAGNOSIS — H25041 Posterior subcapsular polar age-related cataract, right eye: Secondary | ICD-10-CM | POA: Diagnosis not present

## 2019-05-31 DIAGNOSIS — H02831 Dermatochalasis of right upper eyelid: Secondary | ICD-10-CM | POA: Diagnosis not present

## 2019-05-31 DIAGNOSIS — H2513 Age-related nuclear cataract, bilateral: Secondary | ICD-10-CM | POA: Diagnosis not present

## 2019-05-31 DIAGNOSIS — H43811 Vitreous degeneration, right eye: Secondary | ICD-10-CM | POA: Diagnosis not present

## 2019-06-08 DIAGNOSIS — M5432 Sciatica, left side: Secondary | ICD-10-CM | POA: Diagnosis not present

## 2019-06-08 DIAGNOSIS — M4726 Other spondylosis with radiculopathy, lumbar region: Secondary | ICD-10-CM | POA: Diagnosis not present

## 2019-06-08 DIAGNOSIS — M5431 Sciatica, right side: Secondary | ICD-10-CM | POA: Diagnosis not present

## 2019-07-01 IMAGING — CR DG CHEST 2V
2 series · 2 of 2 positions shown · non-contrast
Comparison: CT 01/03/2018.

CLINICAL DATA: Pain adjacent sternum. Radiation to posteriorly to
the right.

EXAM:
CHEST - 2 VIEW

[w chest pa]
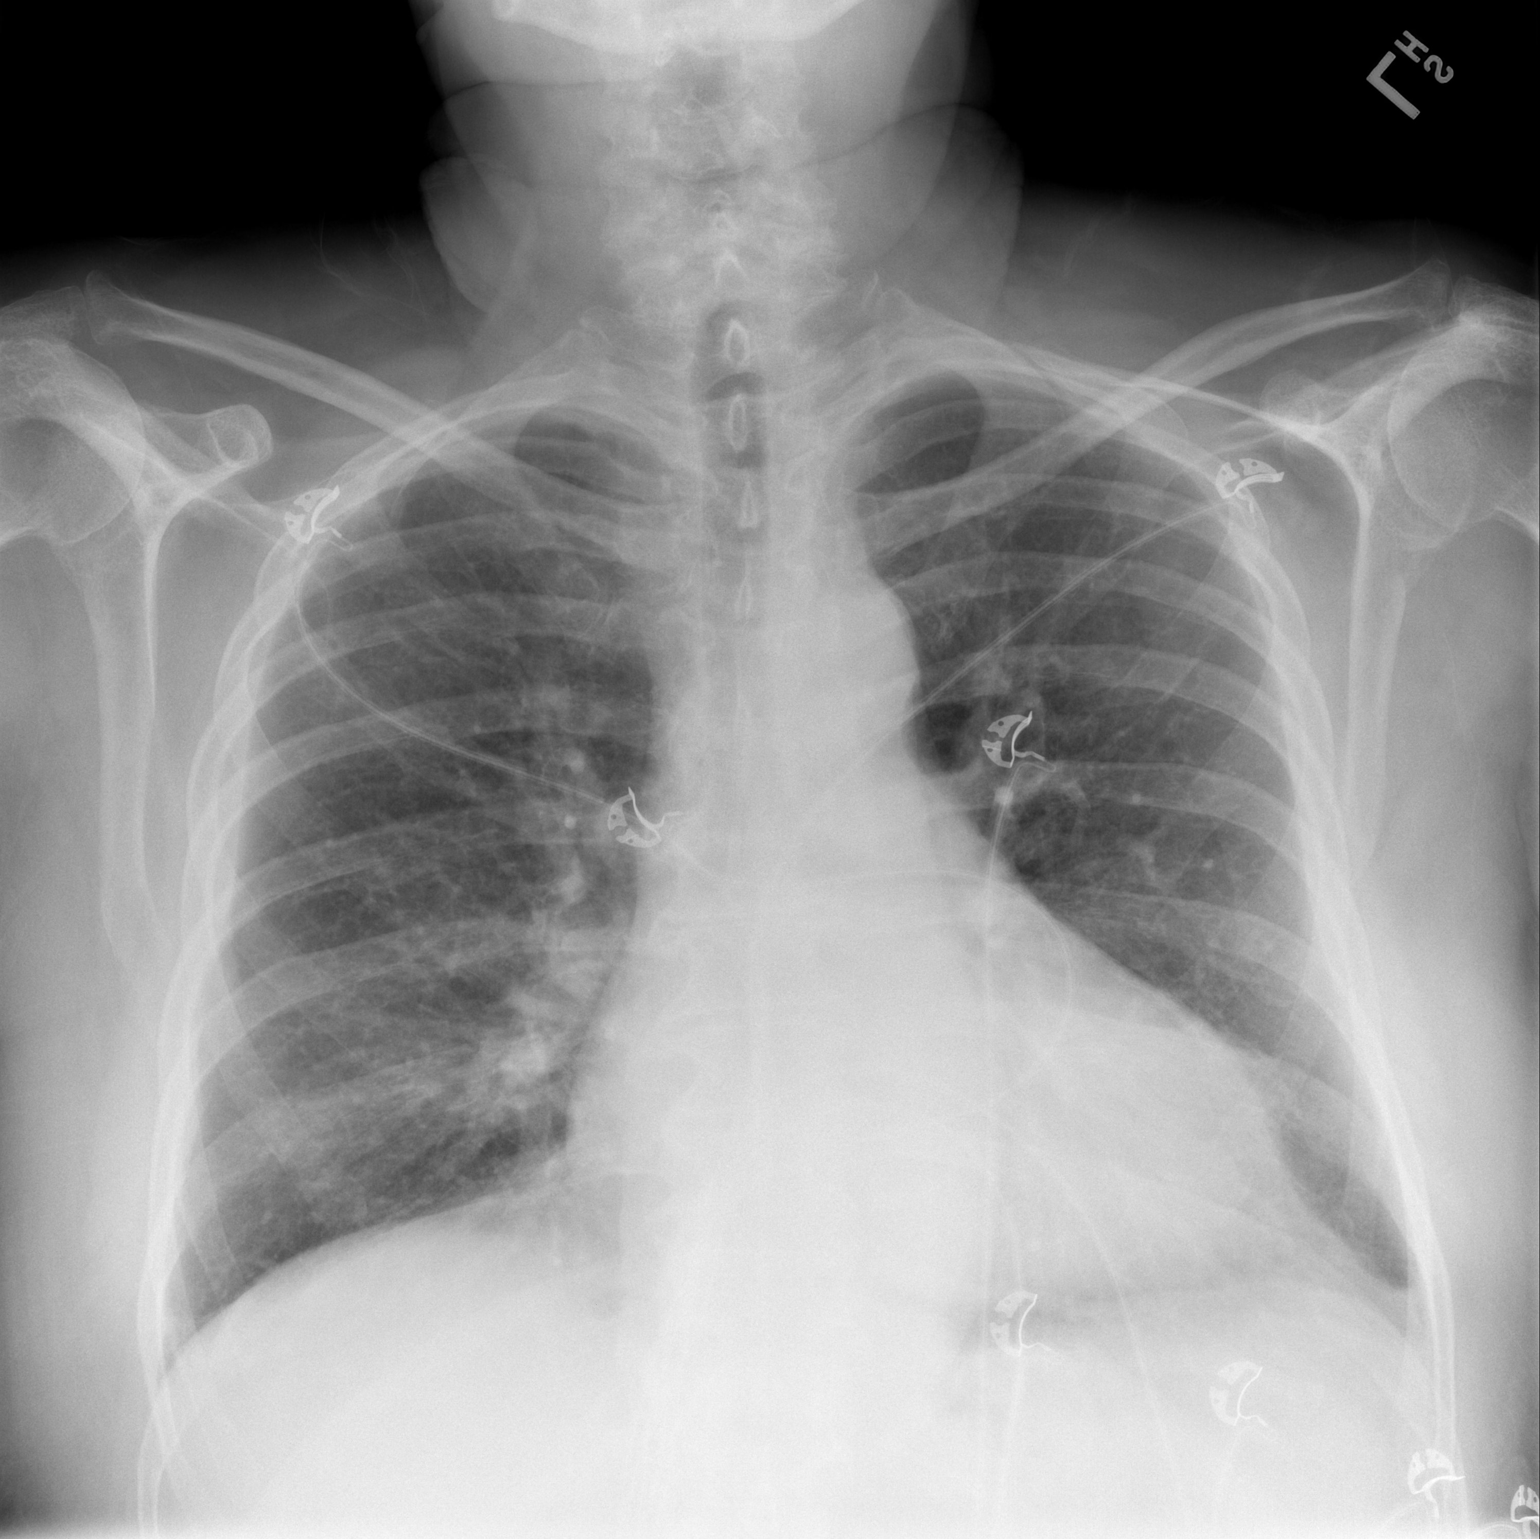

[w chest lat]
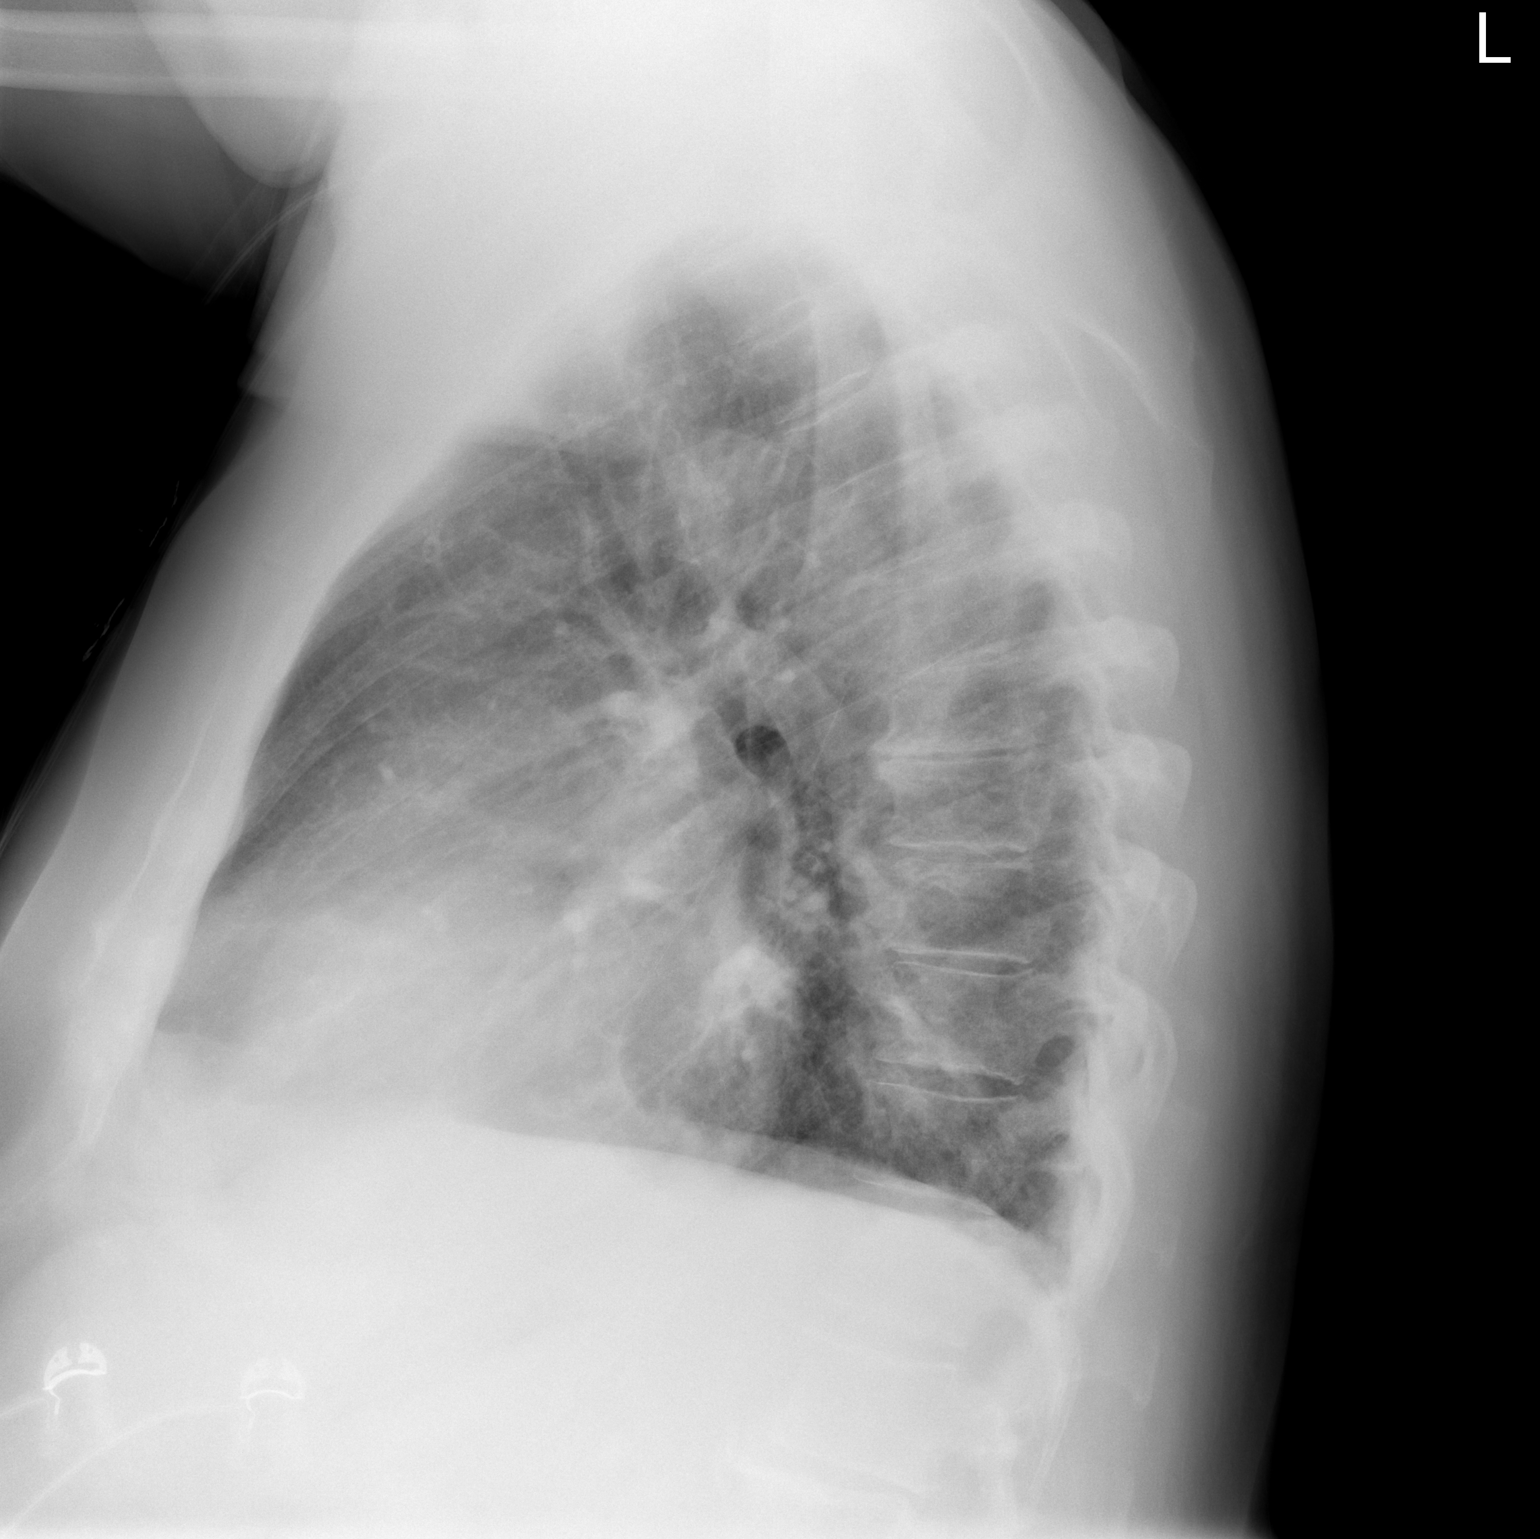

[2 of 2 positions shown; findings below may reference images not displayed]

FINDINGS: Mediastinum and hilar structures normal. Cardiomegaly with normal
pulmonary vascularity. Low lung volumes with mild basilar
atelectasis. No focal alveolar infiltrate. No pleural effusion or
pneumothorax. No acute bony abnormality. Degenerative change
thoracic spine.
IMPRESSION: 1. Low lung volumes with mild basilar atelectasis. No focal alveolar
infiltrate.

2.  Cardiomegaly no pulmonary venous congestion.

3. No acute bony abnormality. Degenerative changes thoracic spine.
Sternal region appears unremarkable.

## 2019-07-06 DIAGNOSIS — M791 Myalgia, unspecified site: Secondary | ICD-10-CM | POA: Diagnosis not present

## 2019-07-06 DIAGNOSIS — M48061 Spinal stenosis, lumbar region without neurogenic claudication: Secondary | ICD-10-CM | POA: Diagnosis not present

## 2019-07-06 DIAGNOSIS — M5416 Radiculopathy, lumbar region: Secondary | ICD-10-CM | POA: Diagnosis not present

## 2019-07-06 DIAGNOSIS — M47816 Spondylosis without myelopathy or radiculopathy, lumbar region: Secondary | ICD-10-CM | POA: Diagnosis not present

## 2019-07-12 DIAGNOSIS — H43811 Vitreous degeneration, right eye: Secondary | ICD-10-CM | POA: Diagnosis not present

## 2019-07-12 DIAGNOSIS — H25041 Posterior subcapsular polar age-related cataract, right eye: Secondary | ICD-10-CM | POA: Diagnosis not present

## 2019-07-12 DIAGNOSIS — H2513 Age-related nuclear cataract, bilateral: Secondary | ICD-10-CM | POA: Diagnosis not present

## 2019-07-12 DIAGNOSIS — H02831 Dermatochalasis of right upper eyelid: Secondary | ICD-10-CM | POA: Diagnosis not present

## 2019-07-24 DIAGNOSIS — E119 Type 2 diabetes mellitus without complications: Secondary | ICD-10-CM | POA: Diagnosis not present

## 2019-07-24 DIAGNOSIS — B351 Tinea unguium: Secondary | ICD-10-CM | POA: Diagnosis not present

## 2019-07-24 DIAGNOSIS — M79672 Pain in left foot: Secondary | ICD-10-CM | POA: Diagnosis not present

## 2019-07-24 DIAGNOSIS — M79671 Pain in right foot: Secondary | ICD-10-CM | POA: Diagnosis not present

## 2019-07-25 ENCOUNTER — Other Ambulatory Visit: Payer: Self-pay

## 2019-07-25 ENCOUNTER — Ambulatory Visit: Payer: Medicare Other | Attending: Internal Medicine | Admitting: Physical Therapy

## 2019-07-25 DIAGNOSIS — M6281 Muscle weakness (generalized): Secondary | ICD-10-CM | POA: Diagnosis not present

## 2019-07-25 DIAGNOSIS — I2510 Atherosclerotic heart disease of native coronary artery without angina pectoris: Secondary | ICD-10-CM | POA: Insufficient documentation

## 2019-07-25 DIAGNOSIS — M5441 Lumbago with sciatica, right side: Secondary | ICD-10-CM | POA: Insufficient documentation

## 2019-07-25 DIAGNOSIS — E118 Type 2 diabetes mellitus with unspecified complications: Secondary | ICD-10-CM | POA: Insufficient documentation

## 2019-07-25 DIAGNOSIS — I5032 Chronic diastolic (congestive) heart failure: Secondary | ICD-10-CM | POA: Insufficient documentation

## 2019-07-25 DIAGNOSIS — G8929 Other chronic pain: Secondary | ICD-10-CM | POA: Insufficient documentation

## 2019-07-25 DIAGNOSIS — Z9181 History of falling: Secondary | ICD-10-CM | POA: Diagnosis not present

## 2019-07-25 DIAGNOSIS — R2689 Other abnormalities of gait and mobility: Secondary | ICD-10-CM | POA: Diagnosis not present

## 2019-07-25 DIAGNOSIS — G629 Polyneuropathy, unspecified: Secondary | ICD-10-CM | POA: Diagnosis not present

## 2019-07-25 DIAGNOSIS — E785 Hyperlipidemia, unspecified: Secondary | ICD-10-CM | POA: Diagnosis not present

## 2019-07-25 DIAGNOSIS — N183 Chronic kidney disease, stage 3 unspecified: Secondary | ICD-10-CM | POA: Insufficient documentation

## 2019-07-25 DIAGNOSIS — M62830 Muscle spasm of back: Secondary | ICD-10-CM | POA: Insufficient documentation

## 2019-07-25 DIAGNOSIS — I130 Hypertensive heart and chronic kidney disease with heart failure and stage 1 through stage 4 chronic kidney disease, or unspecified chronic kidney disease: Secondary | ICD-10-CM | POA: Insufficient documentation

## 2019-07-25 NOTE — Therapy (Signed)
[facility] [address]  [place], [place], [address] Phone: [phone]   Fax:  [phone]  Physical Therapy Evaluation  Patient Details  Name: Thomas [name] MRN: [id number] Date of Birth: 1946-03-02 Referring Provider (PT): Janett Billow, MD   Encounter Date: 07/25/2019  PT End of Session - 07/25/19 0802    Visit Number  1    Number of Visits  15    Date for PT Re-Evaluation  09/19/19    Authorization Type  Medicare & VA (15 visits)    PT Start Time  0802    PT Stop Time  0850    PT Time Calculation (min)  48 min    Activity Tolerance  Patient tolerated treatment well;Patient limited by pain    Behavior During Therapy  [facility] for tasks assessed/performed       Past Medical History:  Diagnosis Date  . Actinic keratosis 05/12/2017  . Back pain   . Depression 05/12/2017  . Diabetes ([place])   . GERD (gastroesophageal reflux disease) 05/12/2017  . Hx of colonic polyps 05/12/2017  . Hyperlipidemia 05/12/2017  . Hypertension   . 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 Low back pain 05/12/2017  . Lumbar radiculopathy 05/12/2017  . 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 Obesity 05/12/2017  . Osteoarthritis of knee 05/12/2017  . Paresthesia 05/12/2017  . 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 Prostate nodule 05/12/2017  . Seborrheic dermatitis 05/12/2017    Past Surgical History:  Procedure Laterality Date  . HAND SURGERY    . REPLACEMENT TOTAL KNEE Right   . RIGHT/LEFT HEART CATH AND CORONARY ANGIOGRAPHY N/A 01/12/2018   Procedure: RIGHT/LEFT HEART CATH AND CORONARY ANGIOGRAPHY;  Surgeon: Belva Crome, MD;  Location: [facility];  Service: Cardiovascular;  Laterality: N/A;  . SHOULDER SURGERY Right     There were no vitals filed for this visit.   Subjective Assessment - 07/25/19 0804    Subjective  Pt reports long h/o back problems dating back to [place] era. Has had some relief from injections in the past but recent injections not  as effective. Scheduled to get an injection in a few weeks from Dr. Marcello Moores in [place]. Pain typically constant and localized to the R low back and side.    Pertinent History  R TKA    Limitations  Sitting;Standing;Walking;Lifting;House hold activities    How long can you sit comfortably?  pain all the time    How long can you stand comfortably?  <5 minutes    How long can you walk comfortably?  <5 minutes    Patient Stated Goals  "to get where I can function"    Currently in Pain?  Yes    Pain Score  9     Pain Location  Back    Pain Orientation  Right    Pain Descriptors / Indicators  Other (Comment)   "stick in my back"   Pain Type  Chronic pain    Pain Radiating Towards  down lateral LE to knee    Pain Onset  More than a month ago    Pain Frequency  Constant    Aggravating Factors   "anything"    Pain Relieving Factors  4 oxycodone (5 mg)    Effect of Pain on Daily Activities  pain impacts "everything"         [facility] PT Assessment - 07/25/19 0802      Assessment   Medical Diagnosis  Chronic low back pain    Referring Provider (PT)  Janett Billow, MD    Onset Date/Surgical Date  --   chronic   Next MD Visit  Jan 2021    Prior Therapy  remote h/o of multiple PT episodes for back pain      Precautions   Precautions  Fall      Balance Screen   Has the patient fallen in the past 6 months  Yes    How many times?  3-4    Has the patient had a decrease in activity level because of a fear of falling?   No    Is the patient reluctant to leave their home because of a fear of falling?   No      Home Environment   Living Environment  Private residence    Living Arrangements  Spouse/significant other    Type of [place] Access  Level entry    [place]  One level      Prior Function   Level of [place]  Retired    Leisure  wood working      Cognition   Overall Cognitive Status  Within Functional Limits for tasks assessed       Observation/Other Assessments   Focus on Therapeutic Outcomes (FOTO)   Lumbar - 43% (57% limitation); Predicted 50% (50% limitation)      ROM / Strength   AROM / PROM / Strength  AROM;Strength      AROM   Overall AROM   Within functional limits for tasks performed;Deficits;Due to pain    AROM Assessment Site  Lumbar    Lumbar - Right Side Bend  pain       Strength   Overall Strength Comments  tested in sitting    Strength Assessment Site  Hip;Knee;Ankle    Right/Left Hip  Right;Left    Right Hip Flexion  4-/5   R LBP   Right Hip Extension  4-/5    Right Hip External Rotation   4-/5    Right Hip Internal Rotation  4-/5    Right Hip ABduction  4+/5   R LBP   Right Hip ADduction  4-/5   R LBP   Left Hip Flexion  4/5    Left Hip Extension  4-/5    Left Hip External Rotation  4/5    Left Hip Internal Rotation  4/5    Left Hip ABduction  4+/5    Left Hip ADduction  4/5    Right/Left Knee  Right;Left    Right Knee Flexion  4/5    Right Knee Extension  4+/5    Left Knee Flexion  4/5    Left Knee Extension  4+/5    Right/Left Ankle  Right;Left    Right Ankle Dorsiflexion  4-/5    Right Ankle Plantar Flexion  4-/5   limited lift   Left Ankle Dorsiflexion  4+/5    Left Ankle Plantar Flexion  3+/5   very limited lift               Objective measurements completed on examination: See above findings.      [facility] Adult PT Treatment/Exercise - 07/25/19 0802      Exercises   Exercises  Lumbar      Lumbar Exercises: Stretches   Passive Hamstring Stretch  Right;Left;30 seconds;1 rep    Passive Hamstring Stretch Limitations  seated  hip hinge    Lower Trunk Rotation  10 seconds;5 reps    Quadruped Mid Back Stretch  30 seconds;1 rep    Quadruped Mid Back Stretch Limitations  seated prayer stretch    Piriformis Stretch  Right;Left;30 seconds;2 reps    Piriformis Stretch Limitations  supine & seated KTOS - pt preferring seated position    Figure 4 Stretch  30 seconds;2  reps;Seated;With overpressure    Figure 4 Stretch Limitations  side sitting hip hinge             PT Education - 07/25/19 0850    Education Details  PT eval findings, anticipated POC & initial HEP    Person(s) Educated  Patient    Methods  Explanation;Demonstration;Handout    Comprehension  Verbalized understanding;Returned demonstration;Need further instruction       PT Short Term Goals - 07/25/19 0850      PT SHORT TERM GOAL #1   Title  Patient will be independent with initial HEP    Status  New    Target Date  08/15/19      PT SHORT TERM GOAL #2   Title  Patient to report pain reduction in frequency and intensity by >/= 25%    Status  New    Target Date  08/22/19        PT Long Term Goals - 07/25/19 0850      PT LONG TERM GOAL #1   Title  Patient will be independent with ongoing/advanced HEP    Status  New    Target Date  09/19/19      PT LONG TERM GOAL #2   Title  Patient to demonstrate appropriate posture and body mechanics needed for daily activities    Status  New    Target Date  09/19/19      PT LONG TERM GOAL #3   Title  Patient to report pain reduction in frequency and intensity by >/= 50%    Status  New    Target Date  09/19/19      PT LONG TERM GOAL #4   Title  Patient to report ability to perform ADLs, household and work-related tasks without increased pain    Status  New    Target Date  09/19/19      PT LONG TERM GOAL #5   Title  Patient will report ability to complete his wood working projects with decreased low back pain interference    Status  New    Target Date  09/19/19             Plan - 07/25/19 0850    Clinical Impression Statement  Thomas [name] is a 73 y/o male who presents to OP PT from chronic LBP with R sided sciatica. He reports pain dates back to the [place] era with recent exacerbation over last year. Pain currently 9/10 localized in R low back and buttock area with intermittent radicular pain down lateral R LE to knee.  He  has had some relief with injections in the past but inconsistently, although he reports he is scheduled for an injection in the next month. Lumbar ROM essentially WFL/WNL but pain noted with R lateral flexion along with decreased proximal flexibility throughout proximal LE and lumbopelvic musculature. Mild to moderate proximal LE weakness also evident. Pain exacerbated by "anything" and only partially relieved by 4 65m oxycodone, with pain impacting "everything" he does. He is hoping dry needling will give him some relief as it has really  helped his wife. Thomas [name] will benefit from skilled PT to address above deficits to hopefully decrease abnormal muscle tension and reduce LBP/buttock pain and radicular pain.    Personal Factors and Comorbidities  Age;Comorbidity 3+;Fitness;Time since onset of injury/illness/exacerbation    Comorbidities  HTN, CAD, CHF, DM-II, peripheral neuropathy, OA s/p R TKA, obesity, depression; further PMHx as above    Examination-Activity Limitations  Lift;Carry;Locomotion Level;Sit;Stand;Transfers;Squat;Stairs    Examination-Participation Restrictions  Community Activity;Yard Work    Clinical Decision Making  Moderate    Rehab Potential  Fair    PT Frequency  2x / week    PT Duration  8 weeks    PT Treatment/Interventions  ADLs/Self Care Home Management;Cryotherapy;Electrical Stimulation;Iontophoresis 7m/ml Dexamethasone;Moist Heat;Traction;Gait training;Functional mobility training;Therapeutic activities;Therapeutic exercise;Balance training;Neuromuscular re-education;Patient/family education;Manual techniques;Passive range of motion;Dry needling;Taping;Spinal Manipulations;Joint Manipulations    PT Next Visit Plan  Review initial HEP; posture and body mechanics education; lumbopelvic flexiblity and strengthening; manual therapy including DN as indicated; modalities PRN    PT Home Exercise Plan  07/25/19 -  LTR, piriformis & HS stretches, seated prayer stretch    Consulted and  Agree with Plan of Care  Patient       Patient will benefit from skilled therapeutic intervention in order to improve the following deficits and impairments:  Cardiopulmonary status limiting activity, Decreased activity tolerance, Decreased balance, Decreased endurance, Decreased knowledge of precautions, Decreased mobility, Decreased range of motion, Decreased strength, Difficulty walking, Increased muscle spasms, Impaired flexibility, Impaired sensation  Visit Diagnosis: Chronic right-sided low back pain with right-sided sciatica  Muscle spasm of back  Muscle weakness (generalized)     Problem List Patient Active Problem List   Diagnosis Date Noted  . Coronary artery disease 02/11/2018  . Claudication in peripheral vascular disease ([place] 02/11/2018  . Atypical chest pain 01/12/2018  . Precordial chest pain 01/07/2018  . Coronary artery calcification 01/07/2018  . CKD (chronic kidney disease) stage 3, GFR 30-59 ml/min 11/25/2017  . Chronic diastolic heart failure ([place] 05/13/2017  . Aortic stenosis, mild 05/13/2017  . Low back pain 05/12/2017  . GERD (gastroesophageal reflux disease) 05/12/2017  . Prostate nodule 05/12/2017  . Obesity 05/12/2017  . Depression 05/12/2017  . Hx of colonic polyps 05/12/2017  . 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 Type 2 diabetes mellitus ([place] 05/12/2017  . Hyperlipidemia 05/12/2017  . Hypertensive heart disease with heart failure ([place] 05/12/2017  . Osteoarthritis of knee 05/12/2017  . Sleep apnea 05/12/2017  . Actinic keratosis 05/12/2017  . 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 Seborrheic dermatitis 05/12/2017  . Paresthesia 05/12/2017    Thomas [name] PT, MPT 07/25/2019, 5:53 PM  [facility] [place] [address]Phone: [phone]  Fax:  [phone] Name: Thomas DEMARTINMRN: [id number] of Birth:  [birth year]

## 2019-07-25 NOTE — Patient Instructions (Signed)
Home exercise program created by Annie Paras, PT.  For questions, please contact Erico Stan via phone at [phone] or email at [email or web address]  [facility] [address]  [place], [place], [address] Phone: [phone]   Fax:  [phone]

## 2019-07-31 ENCOUNTER — Other Ambulatory Visit: Payer: Self-pay

## 2019-07-31 ENCOUNTER — Ambulatory Visit: Payer: Medicare Other | Admitting: Physical Therapy

## 2019-07-31 ENCOUNTER — Encounter: Payer: Self-pay | Admitting: Physical Therapy

## 2019-07-31 DIAGNOSIS — G8929 Other chronic pain: Secondary | ICD-10-CM | POA: Diagnosis not present

## 2019-07-31 DIAGNOSIS — M6281 Muscle weakness (generalized): Secondary | ICD-10-CM

## 2019-07-31 DIAGNOSIS — M5441 Lumbago with sciatica, right side: Secondary | ICD-10-CM | POA: Diagnosis not present

## 2019-07-31 DIAGNOSIS — M62830 Muscle spasm of back: Secondary | ICD-10-CM

## 2019-07-31 DIAGNOSIS — N183 Chronic kidney disease, stage 3 unspecified: Secondary | ICD-10-CM | POA: Diagnosis not present

## 2019-07-31 DIAGNOSIS — G629 Polyneuropathy, unspecified: Secondary | ICD-10-CM | POA: Diagnosis not present

## 2019-07-31 NOTE — Patient Instructions (Signed)
Trigger Point Dry Needling  . What is Trigger Point Dry Needling (DN)? o DN is a physical therapy technique used to treat muscle pain and dysfunction. Specifically, DN helps deactivate muscle trigger points (muscle knots).  o A thin filiform needle is used to penetrate the skin and stimulate the underlying trigger point. The goal is for a local twitch response (LTR) to occur and for the trigger point to relax. No medication of any kind is injected during the procedure.   . What Does Trigger Point Dry Needling Feel Like?  o The procedure feels different for each individual patient. Some patients report that they do not actually feel the needle enter the skin and overall the process is not painful. Very mild bleeding may occur. However, many patients feel a deep cramping in the muscle in which the needle was inserted. This is the local twitch response.   [name] How Will I feel after the treatment? o Soreness is normal, and the onset of soreness may not occur for a few hours. Typically this soreness does not last longer than two days.  o Bruising is uncommon, however; ice can be used to decrease any possible bruising.  o In rare cases feeling tired or nauseous after the treatment is normal. In addition, your symptoms may get worse before they get better, this period will typically not last longer than 24 hours.   . What Can I do After My Treatment? o Increase your hydration by drinking more water for the next 24 hours. o You may place ice or heat on the areas treated that have become sore, however, do not use heat on inflamed or bruised areas. Heat often brings more relief post needling. o You can continue your regular activities, but vigorous activity is not recommended initially after the treatment for 24 hours. o DN is best combined with other physical therapy such as strengthening, stretching, and other therapies.

## 2019-07-31 NOTE — Therapy (Signed)
[facility] [address]  [place], [place], [address] Phone: [phone]   Fax:  [phone]  Physical Therapy Treatment  Patient Details  Name: Thomas [name] MRN: [id number] Date of Birth: 1945-11-24 Referring Provider (PT): Janett Billow, MD   Encounter Date: 07/31/2019  PT End of Session - 07/31/19 1308    Visit Number  2    Number of Visits  15    Date for PT Re-Evaluation  09/19/19    Authorization Type  Medicare & VA (15 visits)    PT Start Time  1308    PT Stop Time  1354    PT Time Calculation (min)  46 min    Activity Tolerance  Patient tolerated treatment well    Behavior During Therapy  [facility] for tasks assessed/performed       Past Medical History:  Diagnosis Date  . Actinic keratosis 05/12/2017  . Back pain   . Depression 05/12/2017  . Diabetes ([place])   . GERD (gastroesophageal reflux disease) 05/12/2017  . Hx of colonic polyps 05/12/2017  . Hyperlipidemia 05/12/2017  . Hypertension   . 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 Low back pain 05/12/2017  . Lumbar radiculopathy 05/12/2017  . 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 Obesity 05/12/2017  . Osteoarthritis of knee 05/12/2017  . Paresthesia 05/12/2017  . 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 Prostate nodule 05/12/2017  . Seborrheic dermatitis 05/12/2017    Past Surgical History:  Procedure Laterality Date  . HAND SURGERY    . REPLACEMENT TOTAL KNEE Right   . RIGHT/LEFT HEART CATH AND CORONARY ANGIOGRAPHY N/A 01/12/2018   Procedure: RIGHT/LEFT HEART CATH AND CORONARY ANGIOGRAPHY;  Surgeon: Belva Crome, MD;  Location: [facility];  Service: Cardiovascular;  Laterality: N/A;  . SHOULDER SURGERY Right     There were no vitals filed for this visit.  Subjective Assessment - 07/31/19 1310    Subjective  Pt reportin his pain "is pretty much like it is everyday'.    Pertinent History  R TKA    Patient Stated Goals  "to get where I can function"    Currently in  Pain?  Yes    Pain Score  9     Pain Location  Back    Pain Orientation  Right;Lower    Pain Descriptors / Indicators  Stabbing    Pain Type  Chronic pain    Pain Frequency  Constant                       [facility] Adult PT Treatment/Exercise - 07/31/19 1308      Exercises   Exercises  Lumbar      Lumbar Exercises: Stretches   Single Knee to Chest Stretch  Right;Left;30 seconds;2 reps    Single Knee to Chest Stretch Limitations  manual & with towel assist    Lower Trunk Rotation  4 reps   15 sec   Piriformis Stretch  Right;Left;30 seconds;2 reps    Piriformis Stretch Limitations  supine with towel    Figure 4 Stretch  30 seconds;2 reps;Supine;With overpressure    Figure 4 Stretch Limitations  manual by PT      Lumbar Exercises: Aerobic   Nustep  L4 x 6 min (UE/LE)      Manual Therapy   Manual Therapy  Soft tissue mobilization;Myofascial release    Manual therapy comments  L side lying with pillow between knees  Soft tissue mobilization  STM/DTM to R QL, glutes & piriformis    Myofascial Release  MFR & manual TPR to R QL, glute minimus & piriformis       Trigger Point Dry Needling - 07/31/19 1308    Consent Given?  Yes    Education Handout Provided  Yes    Muscles Treated Back/Hip  Gluteus minimus;Piriformis;Quadratus lumborum   Right   Gluteus Minimus Response  Twitch response elicited;Palpable increased muscle length    Piriformis Response  Twitch response elicited;Palpable increased muscle length    Quadratus Lumborum Response  Twitch response elicited;Palpable increased muscle length           PT Education - 07/31/19 1323    Education Details  Role of DN, expected response to treatment and recommended post-treatment activity level    Person(s) Educated  Patient    Methods  Explanation;Handout    Comprehension  Verbalized understanding       PT Short Term Goals - 07/31/19 1313      PT SHORT TERM GOAL #1   Title  Patient will be independent  with initial HEP    Status  On-going    Target Date  08/15/19      PT SHORT TERM GOAL #2   Title  Patient to report pain reduction in frequency and intensity by >/= 25%    Status  On-going    Target Date  08/22/19        PT Long Term Goals - 07/31/19 1313      PT LONG TERM GOAL #1   Title  Patient will be independent with ongoing/advanced HEP    Status  On-going      PT LONG TERM GOAL #2   Title  Patient to demonstrate appropriate posture and body mechanics needed for daily activities    Status  On-going      PT LONG TERM GOAL #3   Title  Patient to report pain reduction in frequency and intensity by >/= 50%    Status  On-going      PT LONG TERM GOAL #4   Title  Patient to report ability to perform ADLs, household and work-related tasks without increased pain    Status  On-going      PT LONG TERM GOAL #5   Title  Patient will report ability to complete his wood working projects with decreased low back pain interference    Status  On-going            Plan - 07/31/19 1314    Clinical Impression Laurie expressing interest in trying DN as his wife has noted good benefit from this. Pain typically localized to R side with several tender taut bands and TPs identified in R QL, glutes and piriformis - addressed with manual therapy incorporating DN upon informed patient consent. Positive twitch response elicited with palpable reduction in muscle tension and tenderness. Reviewed recommended level of activity post-treatment especially relevant stretches and will assess response to treatment next visit.    Personal Factors and Comorbidities  Age;Comorbidity 3+;Fitness;Time since onset of injury/illness/exacerbation    Comorbidities  HTN, CAD, CHF, DM-II, peripheral neuropathy, OA s/p R TKA, obesity, depression; further PMHx as above    Rehab Potential  Fair    PT Frequency  2x / week    PT Duration  8 weeks    PT Treatment/Interventions  ADLs/Self Care Home  Management;Cryotherapy;Electrical Stimulation;Iontophoresis 70m/ml Dexamethasone;Moist Heat;Traction;Gait training;Functional mobility training;Therapeutic activities;Therapeutic exercise;Balance training;Neuromuscular re-education;Patient/family education;Manual  techniques;Passive range of motion;Dry needling;Taping;Spinal Manipulations;Joint Manipulations    PT Next Visit Plan  assess response to DN; review initial HEP; posture and body mechanics education; lumbopelvic flexiblity and strengthening; manual therapy including DN as indicated; modalities PRN    PT Home Exercise Plan  07/25/19 -  LTR, piriformis & HS stretches, seated prayer stretch    Consulted and Agree with Plan of Care  Patient       Patient will benefit from skilled therapeutic intervention in order to improve the following deficits and impairments:  Cardiopulmonary status limiting activity, Decreased activity tolerance, Decreased balance, Decreased endurance, Decreased knowledge of precautions, Decreased mobility, Decreased range of motion, Decreased strength, Difficulty walking, Increased muscle spasms, Impaired flexibility, Impaired sensation  Visit Diagnosis: Chronic right-sided low back pain with right-sided sciatica  Muscle spasm of back  Muscle weakness (generalized)     Problem List Patient Active Problem List   Diagnosis Date Noted  . Coronary artery disease 02/11/2018  . Claudication in peripheral vascular disease ([place]) 02/11/2018  . Atypical chest pain 01/12/2018  . Precordial chest pain 01/07/2018  . Coronary artery calcification 01/07/2018  . CKD (chronic kidney disease) stage 3, GFR 30-59 ml/min 11/25/2017  . Chronic diastolic heart failure ([place]) 05/13/2017  . Aortic stenosis, mild 05/13/2017  . Low back pain 05/12/2017  . GERD (gastroesophageal reflux disease) 05/12/2017  . Prostate nodule 05/12/2017  . Obesity 05/12/2017  . Depression 05/12/2017  . Hx of colonic polyps 05/12/2017  . Hypogonadism  male 05/12/2017  . Type 2 diabetes mellitus ([place]) 05/12/2017  . Hyperlipidemia 05/12/2017  . Hypertensive heart disease with heart failure ([place]) 05/12/2017  . Osteoarthritis of knee 05/12/2017  . Sleep apnea 05/12/2017  . Actinic keratosis 05/12/2017  . 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 Seborrheic dermatitis 05/12/2017  . Paresthesia 05/12/2017    Percival Spanish, PT, MPT 07/31/2019, 3:13 PM  [facility] [address]  [place], [place], [address] Phone: [phone]   Fax:  [phone]  Name: Thomas [name] MRN: [id number] Date of Birth: 11-30-1945

## 2019-08-02 ENCOUNTER — Other Ambulatory Visit: Payer: Self-pay

## 2019-08-02 ENCOUNTER — Encounter: Payer: Self-pay | Admitting: Physical Therapy

## 2019-08-02 ENCOUNTER — Ambulatory Visit: Payer: Medicare Other | Admitting: Physical Therapy

## 2019-08-02 DIAGNOSIS — N183 Chronic kidney disease, stage 3 unspecified: Secondary | ICD-10-CM | POA: Diagnosis not present

## 2019-08-02 DIAGNOSIS — M6281 Muscle weakness (generalized): Secondary | ICD-10-CM

## 2019-08-02 DIAGNOSIS — G8929 Other chronic pain: Secondary | ICD-10-CM | POA: Diagnosis not present

## 2019-08-02 DIAGNOSIS — M5441 Lumbago with sciatica, right side: Secondary | ICD-10-CM | POA: Diagnosis not present

## 2019-08-02 DIAGNOSIS — G629 Polyneuropathy, unspecified: Secondary | ICD-10-CM | POA: Diagnosis not present

## 2019-08-02 DIAGNOSIS — M62830 Muscle spasm of back: Secondary | ICD-10-CM | POA: Diagnosis not present

## 2019-08-02 NOTE — Therapy (Signed)
[facility] [place] [address]  [place], [place], [address] Phone: [phone]   Fax:  [phone]  Physical Therapy Treatment  Patient Details  Name: Thomas [name] MRN: [id number] Date of Birth: 1945-10-17 Referring Provider (PT): Janett Billow, MD   Encounter Date: 08/02/2019  PT End of Session - 08/02/19 0930    Visit Number  3    Number of Visits  15    Date for PT Re-Evaluation  09/19/19    Authorization Type  Medicare & VA (15 visits)    PT Start Time  0930    PT Stop Time  1018    PT Time Calculation (min)  48 min    Activity Tolerance  Patient tolerated treatment well    Behavior During Therapy  [facility] for tasks assessed/performed       Past Medical History:  Diagnosis Date  . Actinic keratosis 05/12/2017  . Back pain   . Depression 05/12/2017  . Diabetes ([place])   . GERD (gastroesophageal reflux disease) 05/12/2017  . Hx of colonic polyps 05/12/2017  . Hyperlipidemia 05/12/2017  . Hypertension   . 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 Low back pain 05/12/2017  . Lumbar radiculopathy 05/12/2017  . 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 Obesity 05/12/2017  . Osteoarthritis of knee 05/12/2017  . Paresthesia 05/12/2017  . 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 Prostate nodule 05/12/2017  . Seborrheic dermatitis 05/12/2017    Past Surgical History:  Procedure Laterality Date  . HAND SURGERY    . REPLACEMENT TOTAL KNEE Right   . RIGHT/LEFT HEART CATH AND CORONARY ANGIOGRAPHY N/A 01/12/2018   Procedure: RIGHT/LEFT HEART CATH AND CORONARY ANGIOGRAPHY;  Surgeon: Belva Crome, MD;  Location: [place] [facility];  Service: Cardiovascular;  Laterality: N/A;  . SHOULDER SURGERY Right     There were no vitals filed for this visit.  Subjective Assessment - 08/02/19 0932    Subjective  Pt reporting increased pain this morning - states this is typical for him in the mornings. Unsure if DN last visit helped, but does not feel it made anything  worse.    Pertinent History  R TKA    Patient Stated Goals  "to get where I can function"    Currently in Pain?  Yes    Pain Score  9     Pain Location  Back    Pain Orientation  Lower;Right    Pain Descriptors / Indicators  Stabbing    Pain Type  Chronic pain    Pain Frequency  Constant                       [facility] Adult PT Treatment/Exercise - 08/02/19 0930      Self-Care   Self-Care  Posture      Exercises   Exercises  Lumbar      Lumbar Exercises: Stretches   Quadruped Mid Back Stretch  30 seconds;3 reps    Quadruped Mid Back Stretch Limitations  seated 3-way prayer stretch with green Pball    Figure 4 Stretch  30 seconds;2 reps;Supine;With overpressure    Figure 4 Stretch Limitations  side sitting hip hinge      Lumbar Exercises: Aerobic   Nustep  L4 x 6 min (UE/LE)             PT Education - 08/02/19 0936    Education Details  Posture & body mechanics education; HEP update - modified KTOS  piriformis stretch, lumbopelvic stabilization/strengthening    Person(s) Educated  Patient    Methods  Explanation;Demonstration;Verbal cues;Tactile cues;Handout    Comprehension  Verbalized understanding;Returned demonstration;Verbal cues required;Tactile cues required;Need further instruction       PT Short Term Goals - 07/31/19 1313      PT SHORT TERM GOAL #1   Title  Patient will be independent with initial HEP    Status  On-going    Target Date  08/15/19      PT SHORT TERM GOAL #2   Title  Patient to report pain reduction in frequency and intensity by >/= 25%    Status  On-going    Target Date  08/22/19        PT Long Term Goals - 07/31/19 1313      PT LONG TERM GOAL #1   Title  Patient will be independent with ongoing/advanced HEP    Status  On-going      PT LONG TERM GOAL #2   Title  Patient to demonstrate appropriate posture and body mechanics needed for daily activities    Status  On-going      PT LONG TERM GOAL #3   Title  Patient  to report pain reduction in frequency and intensity by >/= 50%    Status  On-going      PT LONG TERM GOAL #4   Title  Patient to report ability to perform ADLs, household and work-related tasks without increased pain    Status  On-going      PT LONG TERM GOAL #5   Title  Patient will report ability to complete his wood working projects with decreased low back pain interference    Status  On-going            Plan - 08/02/19 0935    Clinical Impression Statement  [name] reporting increased pain this morning which he states is typical for him in the morning. Provided education in posture and body mechanics for sleeping, mobility and transfers as well as typical household chores with patient acknowledging understanding. Reviewed initial HEP stretches modifying seated KTOS to supine modified SKTC/KTOS piriformis stretch with towel assist with patient reporting better comfort for his knee. Initiated lumbopelvic stabilization/strengthening with patient requiring cues to avoid holding his breath. Some discomfort noted with brace marching but otherwise well tolerated, therefore provided HEP instruction with patient cautioned to defer brace march if increased pain continues. Patient noting pain essentially unchanged by end of session but declined modalities, reporting he could use his home estim unit as needed.    Personal Factors and Comorbidities  Age;Comorbidity 3+;Fitness;Time since onset of injury/illness/exacerbation    Comorbidities  HTN, CAD, CHF, DM-II, peripheral neuropathy, OA s/p R TKA, obesity, depression; further PMHx as above    Rehab Potential  Fair    PT Frequency  2x / week    PT Duration  8 weeks    PT Treatment/Interventions  ADLs/Self Care Home Management;Cryotherapy;Electrical Stimulation;Iontophoresis 51m/ml Dexamethasone;Moist Heat;Traction;Gait training;Functional mobility training;Therapeutic activities;Therapeutic exercise;Balance training;Neuromuscular  re-education;Patient/family education;Manual techniques;Passive range of motion;Dry needling;Taping;Spinal Manipulations;Joint Manipulations    PT Next Visit Plan  reviewl HEP as indicated; address any questions/concerns re: posture and body mechanics education; lumbopelvic flexiblity and strengthening; manual therapy including DN as indicated; modalities PRN    PT Home Exercise Plan  07/25/19 -  LTR, piriformis & HS stretches, seated prayer stretch; 08/02/19 - modified supine SKTC/KTOS piriformis stretch with towel, lumbopelvic stabilization/strengthening (pelvic tilt, brace march, bent-knee fall-out)    Consulted  and Agree with Plan of Care  Patient       Patient will benefit from skilled therapeutic intervention in order to improve the following deficits and impairments:  Cardiopulmonary status limiting activity, Decreased activity tolerance, Decreased balance, Decreased endurance, Decreased knowledge of precautions, Decreased mobility, Decreased range of motion, Decreased strength, Difficulty walking, Increased muscle spasms, Impaired flexibility, Impaired sensation  Visit Diagnosis: Chronic right-sided low back pain with right-sided sciatica  Muscle spasm of back  Muscle weakness (generalized)     Problem List Patient Active Problem List   Diagnosis Date Noted  . Coronary artery disease 02/11/2018  . Claudication in peripheral vascular disease ([place]) 02/11/2018  . Atypical chest pain 01/12/2018  . Precordial chest pain 01/07/2018  . Coronary artery calcification 01/07/2018  . CKD (chronic kidney disease) stage 3, GFR 30-59 ml/min 11/25/2017  . Chronic diastolic heart failure ([place]) 05/13/2017  . Aortic stenosis, mild 05/13/2017  . Low back pain 05/12/2017  . GERD (gastroesophageal reflux disease) 05/12/2017  . Prostate nodule 05/12/2017  . Obesity 05/12/2017  . Depression 05/12/2017  . Hx of colonic polyps 05/12/2017  . 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 Type 2 diabetes mellitus  ([place]) 05/12/2017  . Hyperlipidemia 05/12/2017  . Hypertensive heart disease with heart failure ([place]) 05/12/2017  . Osteoarthritis of knee 05/12/2017  . Sleep apnea 05/12/2017  . Actinic keratosis 05/12/2017  . 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 Seborrheic dermatitis 05/12/2017  . Paresthesia 05/12/2017    Percival Spanish, PT, MPT 08/02/2019, 10:49 AM  [facility] [address]  [place], [place], [address] Phone: [phone]   Fax:  [phone]  Name: Thomas [name] MRN: [id number] Date of Birth: [birth year]

## 2019-08-02 NOTE — Patient Instructions (Addendum)
Sleeping on Back  Place pillow under knees. A pillow with cervical support and a roll around waist are also helpful. Copyright  VHI. All rights reserved.  Sleeping on Side Place pillow between knees. Use cervical support under neck and a roll around waist as needed. Copyright  VHI. All rights reserved.   Sleeping on Stomach   If this is the only desirable sleeping position, place pillow under lower legs, and under stomach or chest as needed.  Posture - Sitting   Sit upright, head facing forward. Try using a roll to support lower back. Keep shoulders relaxed, and avoid rounded back. Keep hips level with knees. Avoid crossing legs for long periods. Stand to Sit / Sit to Stand   To sit: Bend knees to lower self onto front edge of chair, then scoot back on seat. To stand: Reverse sequence by placing one foot forward, and scoot to front of seat. Use rocking motion to stand up.   Work Height and Reach  Ideal work height is no more than 2 to 4 inches below elbow level when standing, and at elbow level when sitting. Reaching should be limited to arm's length, with elbows slightly bent.  Bending  Bend at hips and knees, not back. Keep feet shoulder-width apart.    Posture - Standing   Good posture is important. Avoid slouching and forward head thrust. Maintain curve in low back and align ears over shoul- ders, hips over ankles.  Alternating Positions   Alternate tasks and change positions frequently to reduce fatigue and muscle tension. Take rest breaks. Computer Work   Position work to Programmer, multimedia. Use proper work and seat height. Keep shoulders back and down, wrists straight, and elbows at right angles. Use chair that provides full back support. Add footrest and lumbar roll as needed.  Getting Into / Out of Car  Lower self onto seat, scoot back, then bring in one leg at a time. Reverse sequence to get out.  Dressing  Lie on back to pull socks or slacks over feet, or sit  and bend leg while keeping back straight.    Housework - Sink  Place one foot on ledge of cabinet under sink when standing at sink for prolonged periods.   Pushing / Pulling  Pushing is preferable to pulling. Keep back in proper alignment, and use leg muscles to do the work.  Deep Squat   Squat and lift with both arms held against upper trunk. Tighten stomach muscles without holding breath. Use smooth movements to avoid jerking.  Avoid Twisting   Avoid twisting or bending back. Pivot around using foot movements, and bend at knees if needed when reaching for articles.  Carrying Luggage   Distribute weight evenly on both sides. Use a cart whenever possible. Do not twist trunk. Move body as a unit.   Lifting Principles .Maintain proper posture and head alignment. .Slide object as close as possible before lifting. .Move obstacles out of the way. .Test before lifting; ask for help if too heavy. .Tighten stomach muscles without holding breath. .Use smooth movements; do not jerk. .Use legs to do the work, and pivot with feet. .Distribute the work load symmetrically and close to the center of trunk. .Push instead of pull whenever possible.   Ask For Help   Ask for help and delegate to others when possible. Coordinate your movements when lifting together, and maintain the low back curve.  Log Roll   Lying on back, bend left knee and place left  arm across chest. Roll all in one movement to the right. Reverse to roll to the left. Always move as one unit. Housework - Sweeping  Use long-handled equipment to avoid stooping.   Housework - Wiping  Position yourself as close as possible to reach work surface. Avoid straining your back.  Laundry - Unloading Wash   To unload small items at bottom of washer, lift leg opposite to arm being used to reach.  [place] close to area to be raked. Use arm movements to do the work. Keep back straight and avoid  twisting.     Cart  When reaching into cart with one arm, lift opposite leg to keep back straight.   Getting Into / Out of Bed  Lower self to lie down on one side by raising legs and lowering head at the same time. Use arms to assist moving without twisting. Bend both knees to roll onto back if desired. To sit up, start from lying on side, and use same move-ments in reverse. Housework - Vacuuming  Hold the vacuum with arm held at side. Step back and forth to move it, keeping head up. Avoid twisting.   Laundry - IT consultant so that bending and twisting can be avoided.   Laundry - Unloading Dryer  Squat down to reach into clothes dryer or use a reacher.  Gardening - Weeding / Probation officer or Kneel. Knee pads may be helpful.                     Home exercise program created by Annie Paras, PT.  For questions, please contact Natania Finigan via phone at [phone] or email at [email or web address]  [facility] [address]  [place], [place], [address] Phone: [phone]   Fax:  [phone]

## 2019-08-08 ENCOUNTER — Ambulatory Visit: Payer: Medicare Other | Attending: Internal Medicine | Admitting: Physical Therapy

## 2019-08-08 ENCOUNTER — Other Ambulatory Visit: Payer: Self-pay

## 2019-08-08 ENCOUNTER — Encounter: Payer: Self-pay | Admitting: Physical Therapy

## 2019-08-08 DIAGNOSIS — I5032 Chronic diastolic (congestive) heart failure: Secondary | ICD-10-CM | POA: Diagnosis not present

## 2019-08-08 DIAGNOSIS — E1151 Type 2 diabetes mellitus with diabetic peripheral angiopathy without gangrene: Secondary | ICD-10-CM | POA: Diagnosis not present

## 2019-08-08 DIAGNOSIS — G8929 Other chronic pain: Secondary | ICD-10-CM | POA: Diagnosis present

## 2019-08-08 DIAGNOSIS — M6281 Muscle weakness (generalized): Secondary | ICD-10-CM | POA: Insufficient documentation

## 2019-08-08 DIAGNOSIS — E669 Obesity, unspecified: Secondary | ICD-10-CM | POA: Insufficient documentation

## 2019-08-08 DIAGNOSIS — M5441 Lumbago with sciatica, right side: Secondary | ICD-10-CM | POA: Insufficient documentation

## 2019-08-08 DIAGNOSIS — Z96651 Presence of right artificial knee joint: Secondary | ICD-10-CM | POA: Insufficient documentation

## 2019-08-08 DIAGNOSIS — I2510 Atherosclerotic heart disease of native coronary artery without angina pectoris: Secondary | ICD-10-CM | POA: Diagnosis not present

## 2019-08-08 DIAGNOSIS — I350 Nonrheumatic aortic (valve) stenosis: Secondary | ICD-10-CM | POA: Diagnosis not present

## 2019-08-08 DIAGNOSIS — G4730 Sleep apnea, unspecified: Secondary | ICD-10-CM | POA: Diagnosis not present

## 2019-08-08 DIAGNOSIS — N183 Chronic kidney disease, stage 3 unspecified: Secondary | ICD-10-CM | POA: Diagnosis not present

## 2019-08-08 DIAGNOSIS — E1122 Type 2 diabetes mellitus with diabetic chronic kidney disease: Secondary | ICD-10-CM | POA: Diagnosis not present

## 2019-08-08 DIAGNOSIS — M62830 Muscle spasm of back: Secondary | ICD-10-CM | POA: Diagnosis present

## 2019-08-08 DIAGNOSIS — I110 Hypertensive heart disease with heart failure: Secondary | ICD-10-CM | POA: Insufficient documentation

## 2019-08-08 NOTE — Therapy (Signed)
[facility] [place] [address]  [place], [place], [address] Phone: [phone]   Fax:  [phone]  Physical Therapy Treatment  Patient Details  Name: Thomas [name] MRN: [id number] Date of Birth: 05-12-1946 Referring Provider (PT): Janett Billow, MD   Encounter Date: 08/08/2019  PT End of Session - 08/08/19 1203    Visit Number  4    Number of Visits  15    Date for PT Re-Evaluation  09/19/19    Authorization Type  Medicare & VA (15 visits)    Authorization - Visit Number  3    Authorization - Number of Visits  15    PT Start Time  1016    PT Stop Time  1109    PT Time Calculation (min)  53 min    Activity Tolerance  Patient tolerated treatment well;Patient limited by pain    Behavior During Therapy  [facility] for tasks assessed/performed       Past Medical History:  Diagnosis Date  . Actinic keratosis 05/12/2017  . Back pain   . Depression 05/12/2017  . Diabetes ([place])   . GERD (gastroesophageal reflux disease) 05/12/2017  . Hx of colonic polyps 05/12/2017  . Hyperlipidemia 05/12/2017  . Hypertension   . 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 Low back pain 05/12/2017  . Lumbar radiculopathy 05/12/2017  . 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 Obesity 05/12/2017  . Osteoarthritis of knee 05/12/2017  . Paresthesia 05/12/2017  . 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 Prostate nodule 05/12/2017  . Seborrheic dermatitis 05/12/2017    Past Surgical History:  Procedure Laterality Date  . HAND SURGERY    . REPLACEMENT TOTAL KNEE Right   . RIGHT/LEFT HEART CATH AND CORONARY ANGIOGRAPHY N/A 01/12/2018   Procedure: RIGHT/LEFT HEART CATH AND CORONARY ANGIOGRAPHY;  Surgeon: Belva Crome, MD;  Location: [place] [facility];  Service: Cardiovascular;  Laterality: N/A;  . SHOULDER SURGERY Right     There were no vitals filed for this visit.  Subjective Assessment - 08/08/19 1020    Subjective  Having pain today. Fell on Thanksgiving when walking out the  door. Unsure what caused it exactly. Was able to get up off the floor himself. Since then, has been trying to take it easy. Denies hitting head or sustaining injuries.    Pertinent History  R TKA    Patient Stated Goals  "to get where I can function"    Currently in Pain?  Yes    Pain Score  9     Pain Location  Back    Pain Orientation  Right;Lower    Pain Descriptors / Indicators  Stabbing    Pain Type  Chronic pain    Multiple Pain Sites  Yes    Pain Score  8    Pain Location  Knee    Pain Orientation  Left    Pain Descriptors / Indicators  Sharp    Pain Type  Acute pain                       [facility] Adult PT Treatment/Exercise - 08/08/19 0001      Lumbar Exercises: Stretches   Passive Hamstring Stretch  Right;Left;30 seconds;1 rep    Passive Hamstring Stretch Limitations  supine with strap    Lower Trunk Rotation Limitations  10x to tolerance   more difficulty to the R   Figure 4 Stretch  30 seconds;2 reps;Supine;With overpressure  Figure 4 Stretch Limitations  supine with pt OP      Lumbar Exercises: Aerobic   Recumbent Bike  L1 x 1 min   discontinued d/t L knee pain   Nustep  L4 x 3.5 min (UE/LE)      Lumbar Exercises: Seated   Other Seated Lumbar Exercises  R LE sitting sciatic glides 2x10      Lumbar Exercises: Supine   Bridge  10 reps   slightly limited ROM   Bridge Limitations  cues for rhythmic breathing and proper set up      Modalities   Modalities  Electrical Stimulation;Moist Heat      Moist Heat Therapy   Number Minutes Moist Heat  10 Minutes    Moist Heat Location  Lumbar Spine   R     Electrical Stimulation   Electrical Stimulation Location  B lumbar paraspinals    Electrical Stimulation Action  IFC    Electrical Stimulation Parameters  80-150 hz; output 16 to tol; 10 min    Electrical Stimulation Goals  Pain      Manual Therapy   Manual Therapy  Soft tissue mobilization;Myofascial release    Manual therapy comments  L side  lying with pillow between knees    Soft tissue mobilization  STM/DTM to R QL, lumbar paraspinals, proximal glutes & piriformis    Myofascial Release  manual TPR to R proximal glutes and piriformis               PT Short Term Goals - 07/31/19 1313      PT SHORT TERM GOAL #1   Title  Patient will be independent with initial HEP    Status  On-going    Target Date  08/15/19      PT SHORT TERM GOAL #2   Title  Patient to report pain reduction in frequency and intensity by >/= 25%    Status  On-going    Target Date  08/22/19        PT Long Term Goals - 07/31/19 1313      PT LONG TERM GOAL #1   Title  Patient will be independent with ongoing/advanced HEP    Status  On-going      PT LONG TERM GOAL #2   Title  Patient to demonstrate appropriate posture and body mechanics needed for daily activities    Status  On-going      PT LONG TERM GOAL #3   Title  Patient to report pain reduction in frequency and intensity by >/= 50%    Status  On-going      PT LONG TERM GOAL #4   Title  Patient to report ability to perform ADLs, household and work-related tasks without increased pain    Status  On-going      PT LONG TERM GOAL #5   Title  Patient will report ability to complete his wood working projects with decreased low back pain interference    Status  On-going            Plan - 08/08/19 1206    Clinical Impression Statement  Patient reporting continued pain in R LE with new onset of L knee pain today. Noting that he fell on Thanksgiving without known cause and denying sustained injury. D/t patient's report of high pain levels today, worked on manual therapy for hopeful pain relief and soft tissue restriction. Patient tolerated STM and TPR to R proximal glutes and piriformis with considerable soft tissue restriction  evident in these muscles. Did report good relief in R LB after manual therapy, with L knee pain remaining. Introduced gentle supine LE stretching with patient  reporting good tolerance. Able to perform bridges with limited ROM and cues for rhythmic breathing pattern. Ended session with e-stim and moist heat to LB for continued pain relief. No complaints at end of session.    Personal Factors and Comorbidities  Age;Comorbidity 3+;Fitness;Time since onset of injury/illness/exacerbation    Comorbidities  HTN, CAD, CHF, DM-II, peripheral neuropathy, OA s/p R TKA, obesity, depression; further PMHx as above    Rehab Potential  Fair    PT Frequency  2x / week    PT Duration  8 weeks    PT Treatment/Interventions  ADLs/Self Care Home Management;Cryotherapy;Electrical Stimulation;Iontophoresis 55m/ml Dexamethasone;Moist Heat;Traction;Gait training;Functional mobility training;Therapeutic activities;Therapeutic exercise;Balance training;Neuromuscular re-education;Patient/family education;Manual techniques;Passive range of motion;Dry needling;Taping;Spinal Manipulations;Joint Manipulations    PT Next Visit Plan  reviewl HEP as indicated; address any questions/concerns re: posture and body mechanics education; lumbopelvic flexiblity and strengthening; manual therapy including DN as indicated; modalities PRN    PT Home Exercise Plan  07/25/19 -  LTR, piriformis & HS stretches, seated prayer stretch; 08/02/19 - modified supine SKTC/KTOS piriformis stretch with towel, lumbopelvic stabilization/strengthening (pelvic tilt, brace march, bent-knee fall-out)    Consulted and Agree with Plan of Care  Patient       Patient will benefit from skilled therapeutic intervention in order to improve the following deficits and impairments:  Cardiopulmonary status limiting activity, Decreased activity tolerance, Decreased balance, Decreased endurance, Decreased knowledge of precautions, Decreased mobility, Decreased range of motion, Decreased strength, Difficulty walking, Increased muscle spasms, Impaired flexibility, Impaired sensation  Visit Diagnosis: Chronic right-sided low back pain  with right-sided sciatica  Muscle spasm of back  Muscle weakness (generalized)     Problem List Patient Active Problem List   Diagnosis Date Noted  . Coronary artery disease 02/11/2018  . Claudication in peripheral vascular disease ([place] 02/11/2018  . Atypical chest pain 01/12/2018  . Precordial chest pain 01/07/2018  . Coronary artery calcification 01/07/2018  . CKD (chronic kidney disease) stage 3, GFR 30-59 ml/min 11/25/2017  . Chronic diastolic heart failure ([place] 05/13/2017  . Aortic stenosis, mild 05/13/2017  . Low back pain 05/12/2017  . GERD (gastroesophageal reflux disease) 05/12/2017  . Prostate nodule 05/12/2017  . Obesity 05/12/2017  . Depression 05/12/2017  . Hx of colonic polyps 05/12/2017  . 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 Type 2 diabetes mellitus ([place] 05/12/2017  . Hyperlipidemia 05/12/2017  . Hypertensive heart disease with heart failure ([place] 05/12/2017  . Osteoarthritis of knee 05/12/2017  . Sleep apnea 05/12/2017  . Actinic keratosis 05/12/2017  . 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 Seborrheic dermatitis 05/12/2017  . Paresthesia 05/12/2017     YJanene Harvey PT, DPT 08/08/19 12:12 PM   [facility] [place] [address]Phone: [phone]  Fax:  [phone] Name: BMIKAI MEINTSMRN: [id number] of Birth: 1Mar 07, 1947

## 2019-08-10 ENCOUNTER — Encounter: Payer: Medicare Other | Admitting: Physical Therapy

## 2019-08-10 DIAGNOSIS — M47816 Spondylosis without myelopathy or radiculopathy, lumbar region: Secondary | ICD-10-CM | POA: Diagnosis not present

## 2019-08-10 DIAGNOSIS — M791 Myalgia, unspecified site: Secondary | ICD-10-CM | POA: Diagnosis not present

## 2019-08-10 DIAGNOSIS — M48061 Spinal stenosis, lumbar region without neurogenic claudication: Secondary | ICD-10-CM | POA: Diagnosis not present

## 2019-08-10 DIAGNOSIS — M5416 Radiculopathy, lumbar region: Secondary | ICD-10-CM | POA: Diagnosis not present

## 2019-08-14 ENCOUNTER — Encounter: Payer: Self-pay | Admitting: Physical Therapy

## 2019-08-14 ENCOUNTER — Ambulatory Visit: Payer: Medicare Other | Admitting: Physical Therapy

## 2019-08-14 ENCOUNTER — Other Ambulatory Visit: Payer: Self-pay

## 2019-08-14 DIAGNOSIS — M62830 Muscle spasm of back: Secondary | ICD-10-CM

## 2019-08-14 DIAGNOSIS — M6281 Muscle weakness (generalized): Secondary | ICD-10-CM

## 2019-08-14 DIAGNOSIS — G8929 Other chronic pain: Secondary | ICD-10-CM

## 2019-08-14 DIAGNOSIS — M5441 Lumbago with sciatica, right side: Secondary | ICD-10-CM

## 2019-08-14 NOTE — Patient Instructions (Signed)
   Home exercise program created by Ladina Shutters, PT.  For questions, please contact Giordan Fordham via phone at [phone] or email at [email or web address]  [facility] [place] [address]  [address] [place], [place], [address] Phone: [phone]   Fax:  [phone]    

## 2019-08-14 NOTE — Therapy (Signed)
[facility] [address]  [place], [place], [address] Phone: [phone]   Fax:  [phone]  Physical Therapy Treatment  Patient Details  Name: Thomas [name] MRN: [id number] Date of Birth: 07-31-1946 Referring Provider (PT): Janett Billow, MD   Encounter Date: 08/14/2019  PT End of Session - 08/14/19 0925    Visit Number  5    Number of Visits  15    Date for PT Re-Evaluation  09/19/19    Authorization Type  Medicare & VA (15 visits)    Authorization - Visit Number  5    Authorization - Number of Visits  15    PT Start Time  [phone]    PT Stop Time  1012    PT Time Calculation (min)  47 min    Activity Tolerance  Patient tolerated treatment well;Patient limited by pain    Behavior During Therapy  [facility] for tasks assessed/performed       Past Medical History:  Diagnosis Date  . Actinic keratosis 05/12/2017  . Back pain   . Depression 05/12/2017  . Diabetes ([place])   . GERD (gastroesophageal reflux disease) 05/12/2017  . Hx of colonic polyps 05/12/2017  . Hyperlipidemia 05/12/2017  . Hypertension   . 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 Low back pain 05/12/2017  . Lumbar radiculopathy 05/12/2017  . 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 Obesity 05/12/2017  . Osteoarthritis of knee 05/12/2017  . Paresthesia 05/12/2017  . 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 Prostate nodule 05/12/2017  . Seborrheic dermatitis 05/12/2017    Past Surgical History:  Procedure Laterality Date  . HAND SURGERY    . REPLACEMENT TOTAL KNEE Right   . RIGHT/LEFT HEART CATH AND CORONARY ANGIOGRAPHY N/A 01/12/2018   Procedure: RIGHT/LEFT HEART CATH AND CORONARY ANGIOGRAPHY;  Surgeon: Belva Crome, MD;  Location: [facility];  Service: Cardiovascular;  Laterality: N/A;  . SHOULDER SURGERY Right     There were no vitals filed for this visit.  Subjective Assessment - 08/14/19 0927    Subjective  Pt reporting pain worse today with new burning sensation in R  side - no known trigger with patient reporting he tried to take it easy this weekend. Also notes he has a lot of gas this morning - "more than I usually have". States the injection he was sheduled to have last week has been rescheduled for 08/21/19.    Pertinent History  R TKA    Patient Stated Goals  "to get where I can function"    Currently in Pain?  Yes    Pain Score  --   "20/10"   Pain Location  Back    Pain Orientation  Lower;Right    Pain Descriptors / Indicators  Burning    Pain Type  Chronic pain    Pain Radiating Towards  around R flank to side    Pain Frequency  Constant    Pain Score  6   5-6/10   Pain Location  Knee    Pain Orientation  Left   L>R   Pain Descriptors / Indicators  Sharp    Pain Type  Chronic pain    Pain Frequency  Intermittent                       [facility] Adult PT Treatment/Exercise - 08/14/19 0925      Exercises   Exercises  Lumbar      Lumbar  Exercises: Stretches   Prone on Elbows Stretch  30 seconds;2 reps    Press Ups  5 reps;5 seconds      Lumbar Exercises: Aerobic   Nustep  L3 x 6 min (UE/LE)      Lumbar Exercises: Standing   Row  Both;10 reps;Theraband;Strengthening    Theraband Level (Row)  Level 2 (Red)    Row Limitations  cues for scap retraction & abd bracing    Shoulder Extension  Both;10 reps;Strengthening    Theraband Level (Shoulder Extension)  Level 2 (Red)    Shoulder Extension Limitations  cues for scap retraction & abd bracing      Manual Therapy   Manual Therapy  Soft tissue mobilization;Myofascial release;Joint mobilization    Manual therapy comments  prone    Joint Mobilization  Lumbar grade II-III central P/A mobs    Soft tissue mobilization  STM/DTM to R QL and  B lumbar paraspinals    Myofascial Release  MFR & manual TPR to R QL & R lumbar paraspinals       Trigger Point Dry Needling - 08/14/19 0954    Consent Given?  Yes    Muscles Treated Back/Hip  Erector spinae;Lumbar multifidi;Quadratus  lumborum    Erector spinae Response  Twitch response elicited;Palpable increased muscle length   bilateral    Lumbar multifidi Response  Twitch response elicited;Palpable increased muscle length   bilateral    Quadratus Lumborum Response  Twitch response elicited;Palpable increased muscle length   right          PT Education - 08/14/19 1012    Education Details  HEP update - standing abd bracing + rows/retraction with red TB, seated hip flexor/quad stretch    Person(s) Educated  Patient    Methods  Explanation;Demonstration;Handout    Comprehension  Verbalized understanding;Returned demonstration;Need further instruction       PT Short Term Goals - 07/31/19 1313      PT SHORT TERM GOAL #1   Title  Patient will be independent with initial HEP    Status  On-going    Target Date  08/15/19      PT SHORT TERM GOAL #2   Title  Patient to report pain reduction in frequency and intensity by >/= 25%    Status  On-going    Target Date  08/22/19        PT Long Term Goals - 07/31/19 1313      PT LONG TERM GOAL #1   Title  Patient will be independent with ongoing/advanced HEP    Status  On-going      PT LONG TERM GOAL #2   Title  Patient to demonstrate appropriate posture and body mechanics needed for daily activities    Status  On-going      PT LONG TERM GOAL #3   Title  Patient to report pain reduction in frequency and intensity by >/= 50%    Status  On-going      PT LONG TERM GOAL #4   Title  Patient to report ability to perform ADLs, household and work-related tasks without increased pain    Status  On-going      PT LONG TERM GOAL #5   Title  Patient will report ability to complete his wood working projects with decreased low back pain interference    Status  On-going            Plan - 08/14/19 [place]  arriving to PT with increased pain and new burning sensation in R flank/side without known triggering event/activity.  Increased muscle tension and ttp noted in B lumbar paraspinals (R>L) and R QL as well as decreased P/A joint mobility and increased pain with mid to upper lumbar P/A joint mobs. Manual therapy including DN performed to address above restrictions followed by POE stretching and prone press-ups along with standing abdominal bracing with rows. Patient reporting pain reduced from "20/10" reported on arrival to PT to 8/10 by end of session (pain always 9/10 per patient report) with patient declining need for thermal and/or estim modalities for pain at end of session.    Personal Factors and Comorbidities  Age;Comorbidity 3+;Fitness;Time since onset of injury/illness/exacerbation    Comorbidities  HTN, CAD, CHF, DM-II, peripheral neuropathy, OA s/p R TKA, obesity, depression; further PMHx as above    Rehab Potential  Fair    PT Frequency  2x / week    PT Duration  8 weeks    PT Treatment/Interventions  ADLs/Self Care Home Management;Cryotherapy;Electrical Stimulation;Iontophoresis 92m/ml Dexamethasone;Moist Heat;Traction;Gait training;Functional mobility training;Therapeutic activities;Therapeutic exercise;Balance training;Neuromuscular re-education;Patient/family education;Manual techniques;Passive range of motion;Dry needling;Taping;Spinal Manipulations;Joint Manipulations    PT Next Visit Plan  lumbopelvic flexiblity and strengthening; manual therapy including DN as indicated; modalities PRN; review HEP as indicated; address any questions/concerns re: posture and body mechanics education    PT Home Exercise Plan  07/25/19 -  LTR, piriformis & HS stretches, seated prayer stretch; 08/02/19 - modified supine SKTC/KTOS piriformis stretch with towel, lumbopelvic stabilization/strengthening (pelvic tilt, brace march, bent-knee fall-out); 08/14/19 - standing abd bracing + rows/retraction with red TB, seated hip flexor/quad stretch    Consulted and Agree with Plan of Care  Patient       Patient will benefit from  skilled therapeutic intervention in order to improve the following deficits and impairments:  Cardiopulmonary status limiting activity, Decreased activity tolerance, Decreased balance, Decreased endurance, Decreased knowledge of precautions, Decreased mobility, Decreased range of motion, Decreased strength, Difficulty walking, Increased muscle spasms, Impaired flexibility, Impaired sensation  Visit Diagnosis: Chronic right-sided low back pain with right-sided sciatica  Muscle spasm of back  Muscle weakness (generalized)     Problem List Patient Active Problem List   Diagnosis Date Noted  . Coronary artery disease 02/11/2018  . Claudication in peripheral vascular disease ([place] 02/11/2018  . Atypical chest pain 01/12/2018  . Precordial chest pain 01/07/2018  . Coronary artery calcification 01/07/2018  . CKD (chronic kidney disease) stage 3, GFR 30-59 ml/min 11/25/2017  . Chronic diastolic heart failure ([place] 05/13/2017  . Aortic stenosis, mild 05/13/2017  . Low back pain 05/12/2017  . GERD (gastroesophageal reflux disease) 05/12/2017  . Prostate nodule 05/12/2017  . Obesity 05/12/2017  . Depression 05/12/2017  . Hx of colonic polyps 05/12/2017  . 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 Type 2 diabetes mellitus ([place] 05/12/2017  . Hyperlipidemia 05/12/2017  . Hypertensive heart disease with heart failure ([place] 05/12/2017  . Osteoarthritis of knee 05/12/2017  . Sleep apnea 05/12/2017  . Actinic keratosis 05/12/2017  . 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 Seborrheic dermatitis 05/12/2017  . Paresthesia 05/12/2017    JPercival Spanish PT, MPT 08/14/2019, 11:43 AM  [facility] [place] [address]Phone: [phone]  Fax:  [phone] Name: Thomas ERIKSSONMRN: [id number] of Birth: [birth year]

## 2019-08-16 ENCOUNTER — Other Ambulatory Visit: Payer: Self-pay

## 2019-08-16 ENCOUNTER — Ambulatory Visit: Payer: Medicare Other | Admitting: Physical Therapy

## 2019-08-16 ENCOUNTER — Encounter: Payer: Self-pay | Admitting: Physical Therapy

## 2019-08-16 DIAGNOSIS — M5441 Lumbago with sciatica, right side: Secondary | ICD-10-CM

## 2019-08-16 DIAGNOSIS — G8929 Other chronic pain: Secondary | ICD-10-CM

## 2019-08-16 DIAGNOSIS — M6281 Muscle weakness (generalized): Secondary | ICD-10-CM

## 2019-08-16 DIAGNOSIS — M62830 Muscle spasm of back: Secondary | ICD-10-CM

## 2019-08-16 NOTE — Therapy (Signed)
[facility] [address]  [place], [place], [address] Phone: [phone]   Fax:  [phone]  Physical Therapy Treatment  Patient Details  Name: Thomas [name] MRN: [id number] Date of Birth: 09-30-1945 Referring Provider (PT): Janett Billow, MD   Encounter Date: 08/16/2019  PT End of Session - 08/16/19 0915    Visit Number  6    Number of Visits  15    Date for PT Re-Evaluation  09/19/19    Authorization Type  Medicare & VA (15 visits)    Authorization - Visit Number  6    Authorization - Number of Visits  15    PT Start Time  0915    PT Stop Time  0947    PT Time Calculation (min)  59 min    Activity Tolerance  Patient tolerated treatment well    Behavior During Therapy  [facility] for tasks assessed/performed       Past Medical History:  Diagnosis Date  . Actinic keratosis 05/12/2017  . Back pain   . Depression 05/12/2017  . Diabetes ([place])   . GERD (gastroesophageal reflux disease) 05/12/2017  . Hx of colonic polyps 05/12/2017  . Hyperlipidemia 05/12/2017  . Hypertension   . 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 Low back pain 05/12/2017  . Lumbar radiculopathy 05/12/2017  . 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 Obesity 05/12/2017  . Osteoarthritis of knee 05/12/2017  . Paresthesia 05/12/2017  . 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 Prostate nodule 05/12/2017  . Seborrheic dermatitis 05/12/2017    Past Surgical History:  Procedure Laterality Date  . HAND SURGERY    . REPLACEMENT TOTAL KNEE Right   . RIGHT/LEFT HEART CATH AND CORONARY ANGIOGRAPHY N/A 01/12/2018   Procedure: RIGHT/LEFT HEART CATH AND CORONARY ANGIOGRAPHY;  Surgeon: Belva Crome, MD;  Location: [facility];  Service: Cardiovascular;  Laterality: N/A;  . SHOULDER SURGERY Right     There were no vitals filed for this visit.  Subjective Assessment - 08/16/19 0918    Subjective  Pt reporting good relief from DN but unsure how long it lasted. Noting "sticking" pain  in same area today but denies burning sensation from last session.    Pertinent History  R TKA    Patient Stated Goals  "to get where I can function"    Currently in Pain?  Yes    Pain Score  9     Pain Location  Back    Pain Orientation  Lower;Right    Pain Descriptors / Indicators  Stabbing    Pain Type  Chronic pain    Pain Frequency  Constant    Pain Score  7    Pain Location  Knee    Pain Orientation  Left    Pain Descriptors / Indicators  Sharp    Pain Type  Chronic pain    Pain Frequency  Intermittent                       [facility] Adult PT Treatment/Exercise - 08/16/19 0915      Exercises   Exercises  Lumbar      Lumbar Exercises: Stretches   Passive Hamstring Stretch  Right;Left;30 seconds;1 rep    Passive Hamstring Stretch Limitations  seated hip hinge    Double Knee to Chest Stretch  10 seconds;5 reps    Double Knee to Chest Stretch Limitations  feet resting on green Pball  Hip Flexor Stretch  Right;Left;30 seconds;1 rep    Hip Flexor Stretch Limitations  seated sideways in chair in lunge position    Quadruped Mid Back Stretch  30 seconds;3 reps    Quadruped Mid Back Stretch Limitations  seated 3-way prayer stretch with green Pball    Piriformis Stretch  Right;Left;30 seconds;1 rep    Piriformis Stretch Limitations  pt reporting difficulty with seated KTOS positioning d/t knee pain but supine KTOS better tolerated    Figure 4 Stretch  30 seconds;2 reps;With overpressure;Seated    Figure 4 Stretch Limitations  side sitting hip hinge      Lumbar Exercises: Aerobic   Nustep  L4 x 6 min (UE/LE)      Lumbar Exercises: Supine   Ab Set  10 reps;5 seconds    Clam  10 reps;3 seconds    Clam Limitations  abd bracing + alt hip ABD/ER with red TB - cues to avoid    Bridge  10 reps   slightly limited ROM   Bridge Limitations  + hip ABD isometric into red TB - cues for rhythmic breathing and proper set up    Other Supine Lumbar Exercises  Hip adductiion  isometric ball squeeze + abd bracing 10 x       Modalities   Modalities  Moist Heat      Moist Heat Therapy   Number Minutes Moist Heat  15 Minutes    Moist Heat Location  Lumbar Spine               PT Short Term Goals - 08/16/19 0921      PT SHORT TERM GOAL #1   Title  Patient will be independent with initial HEP    Status  Achieved   08/16/19   Target Date  --      PT SHORT TERM GOAL #2   Title  Patient to report pain reduction in frequency and intensity by >/= 25%    Status  On-going    Target Date  08/22/19        PT Long Term Goals - 07/31/19 1313      PT LONG TERM GOAL #1   Title  Patient will be independent with ongoing/advanced HEP    Status  On-going      PT LONG TERM GOAL #2   Title  Patient to demonstrate appropriate posture and body mechanics needed for daily activities    Status  On-going      PT LONG TERM GOAL #3   Title  Patient to report pain reduction in frequency and intensity by >/= 50%    Status  On-going      PT LONG TERM GOAL #4   Title  Patient to report ability to perform ADLs, household and work-related tasks without increased pain    Status  On-going      PT LONG TERM GOAL #5   Title  Patient will report ability to complete his wood working projects with decreased low back pain interference    Status  On-going            Plan - 08/16/19 0922    Clinical Impression Statement  Bill reporting initial relief from DN last session but uncertain how long benefit noted. He notes pain in same generalized area today no longer describes burning sensation with pain currently more "sticking" (stabbing) in nature. HEP reviewed with good return demonstration for all exercises except seated KTOS piriformis which was deferred d/t increased  knee pain but patient able to better tolerate supine version - STG #1 met. Progressed lumbopelvic flexibility and stabilization exercises with good tolerance - no increased pain but no significant decrease in  pain either. Patient due to f/u with MD for spinal injection on 08/21/19 and will resume PT as directed by MD.    Personal Factors and Comorbidities  Age;Comorbidity 3+;Fitness;Time since onset of injury/illness/exacerbation    Comorbidities  HTN, CAD, CHF, DM-II, peripheral neuropathy, OA s/p R TKA, obesity, depression; further PMHx as above    Rehab Potential  Fair    PT Frequency  2x / week    PT Duration  8 weeks    PT Treatment/Interventions  ADLs/Self Care Home Management;Cryotherapy;Electrical Stimulation;Iontophoresis 51m/ml Dexamethasone;Moist Heat;Traction;Gait training;Functional mobility training;Therapeutic activities;Therapeutic exercise;Balance training;Neuromuscular re-education;Patient/family education;Manual techniques;Passive range of motion;Dry needling;Taping;Spinal Manipulations;Joint Manipulations    PT Next Visit Plan  lumbopelvic flexiblity and strengthening; manual therapy including DN as indicated; modalities PRN; review HEP as indicated; address any questions/concerns re: posture and body mechanics education    PT Home Exercise Plan  07/25/19 -  LTR, piriformis & HS stretches, seated prayer stretch; 08/02/19 - modified supine SKTC/KTOS piriformis stretch with towel, lumbopelvic stabilization/strengthening (pelvic tilt, brace march, bent-knee fall-out); 08/14/19 - standing abd bracing + rows/retraction with red TB, seated hip flexor/quad stretch    Consulted and Agree with Plan of Care  Patient       Patient will benefit from skilled therapeutic intervention in order to improve the following deficits and impairments:  Cardiopulmonary status limiting activity, Decreased activity tolerance, Decreased balance, Decreased endurance, Decreased knowledge of precautions, Decreased mobility, Decreased range of motion, Decreased strength, Difficulty walking, Increased muscle spasms, Impaired flexibility, Impaired sensation  Visit Diagnosis: Chronic right-sided low back pain with  right-sided sciatica  Muscle spasm of back  Muscle weakness (generalized)     Problem List Patient Active Problem List   Diagnosis Date Noted  . Coronary artery disease 02/11/2018  . Claudication in peripheral vascular disease ([place] 02/11/2018  . Atypical chest pain 01/12/2018  . Precordial chest pain 01/07/2018  . Coronary artery calcification 01/07/2018  . CKD (chronic kidney disease) stage 3, GFR 30-59 ml/min 11/25/2017  . Chronic diastolic heart failure ([place] 05/13/2017  . Aortic stenosis, mild 05/13/2017  . Low back pain 05/12/2017  . GERD (gastroesophageal reflux disease) 05/12/2017  . Prostate nodule 05/12/2017  . Obesity 05/12/2017  . Depression 05/12/2017  . Hx of colonic polyps 05/12/2017  . 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 Type 2 diabetes mellitus ([place] 05/12/2017  . Hyperlipidemia 05/12/2017  . Hypertensive heart disease with heart failure ([place] 05/12/2017  . Osteoarthritis of knee 05/12/2017  . Sleep apnea 05/12/2017  . Actinic keratosis 05/12/2017  . 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 Seborrheic dermatitis 05/12/2017  . Paresthesia 05/12/2017    JPercival Spanish PT, MPT 08/16/2019, 10:44 AM  [facility] [place] [address]Phone: [phone]  Fax:  [phone] Name: BMAYRA BRAHMMRN: [id number] of Birth: [birth year]

## 2019-08-21 ENCOUNTER — Encounter: Payer: Medicare Other | Admitting: Physical Therapy

## 2019-08-21 DIAGNOSIS — M48061 Spinal stenosis, lumbar region without neurogenic claudication: Secondary | ICD-10-CM | POA: Diagnosis not present

## 2019-08-23 ENCOUNTER — Encounter: Payer: Self-pay | Admitting: Physical Therapy

## 2019-08-23 ENCOUNTER — Ambulatory Visit: Payer: Medicare Other | Admitting: Physical Therapy

## 2019-08-23 ENCOUNTER — Other Ambulatory Visit: Payer: Self-pay

## 2019-08-23 DIAGNOSIS — M62830 Muscle spasm of back: Secondary | ICD-10-CM

## 2019-08-23 DIAGNOSIS — M5441 Lumbago with sciatica, right side: Secondary | ICD-10-CM | POA: Diagnosis not present

## 2019-08-23 DIAGNOSIS — M6281 Muscle weakness (generalized): Secondary | ICD-10-CM

## 2019-08-23 DIAGNOSIS — G8929 Other chronic pain: Secondary | ICD-10-CM

## 2019-08-23 NOTE — Therapy (Signed)
[facility] [address]  [place], [place], [address] Phone: [phone]   Fax:  [phone]  Physical Therapy Treatment  Patient Details  Name: Thomas [name] MRN: [id number] Date of Birth: August 19, 1946 Referring Provider (PT): Janett Billow, MD   Encounter Date: 08/23/2019  PT End of Session - 08/23/19 0914    Visit Number  7    Number of Visits  15    Date for PT Re-Evaluation  09/19/19    Authorization Type  Medicare & VA (15 visits)    Authorization - Visit Number  7    Authorization - Number of Visits  15    PT Start Time  0914    PT Stop Time  1000    PT Time Calculation (min)  46 min    Activity Tolerance  Patient tolerated treatment well    Behavior During Therapy  [facility] for tasks assessed/performed       Past Medical History:  Diagnosis Date  . Actinic keratosis 05/12/2017  . Back pain   . Depression 05/12/2017  . Diabetes ([place])   . GERD (gastroesophageal reflux disease) 05/12/2017  . Hx of colonic polyps 05/12/2017  . Hyperlipidemia 05/12/2017  . Hypertension   . 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 Low back pain 05/12/2017  . Lumbar radiculopathy 05/12/2017  . 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 Obesity 05/12/2017  . Osteoarthritis of knee 05/12/2017  . Paresthesia 05/12/2017  . 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 Prostate nodule 05/12/2017  . Seborrheic dermatitis 05/12/2017    Past Surgical History:  Procedure Laterality Date  . HAND SURGERY    . REPLACEMENT TOTAL KNEE Right   . RIGHT/LEFT HEART CATH AND CORONARY ANGIOGRAPHY N/A 01/12/2018   Procedure: RIGHT/LEFT HEART CATH AND CORONARY ANGIOGRAPHY;  Surgeon: Belva Crome, MD;  Location: [facility];  Service: Cardiovascular;  Laterality: N/A;  . SHOULDER SURGERY Right     There were no vitals filed for this visit.  Subjective Assessment - 08/23/19 0916    Subjective  Pt stating he is "fair to middling" today. Pt reporting he got a shot in his back last  week - able to sleep in until 10:00 the next day (normally wakes up around 4 ro 5 due to pain) - thinks the shot might be helping a bit.    Pertinent History  R TKA    Patient Stated Goals  "to get where I can function"    Currently in Pain?  Yes    Pain Score  7     Pain Location  Back    Pain Orientation  Lower;Right    Pain Descriptors / Indicators  Stabbing    Pain Type  Chronic pain    Pain Frequency  Constant    Pain Score  9    Pain Location  Knee    Pain Orientation  Left    Pain Descriptors / Indicators  Sharp    Pain Type  Chronic pain    Pain Frequency  Intermittent                       [facility] Adult PT Treatment/Exercise - 08/23/19 0914      Exercises   Exercises  Lumbar      Lumbar Exercises: Stretches   Prone on Elbows Stretch  30 seconds;2 reps    Press Ups  5 reps;5 seconds      Lumbar Exercises: Aerobic  Nustep  L4 x 6 min (UE/LE)      Lumbar Exercises: Standing   Heel Raises  10 reps;3 seconds    Heel Raises Limitations  cues for abd bracing & glute set; UE support on counter    Functional Squats  10 reps;3 seconds    Functional Squats Limitations  counter squat    Other Standing Lumbar Exercises  B glute medius 45 dg kickback with looped yellow TB at ankles x 10    Other Standing Lumbar Exercises  B side stepping & fwd/back monster with looped yellow TB at ankles 4 x 10-12 ft along counter      Manual Therapy   Manual Therapy  Soft tissue mobilization;Myofascial release    Manual therapy comments  prone    Soft tissue mobilization  STM/DTM to R QL and  B lumbar paraspinals, R glutes    Myofascial Release  MFR & manual TPR to R QL & R glute medius/minimus       Trigger Point Dry Needling - 08/23/19 0914    Consent Given?  Yes    Muscles Treated Back/Hip  Lumbar multifidi;Quadratus lumborum;Gluteus minimus;Gluteus medius    Gluteus Minimus Response  Twitch response elicited;Palpable increased muscle length   right   Gluteus Medius  Response  Twitch response elicited;Palpable increased muscle length   right   Erector spinae Response  Twitch response elicited;Palpable increased muscle length   bilateral    Quadratus Lumborum Response  Twitch response elicited;Palpable increased muscle length   right            PT Short Term Goals - 08/23/19 0920      PT SHORT TERM GOAL #1   Title  Patient will be independent with initial HEP    Status  Achieved   08/16/19     PT SHORT TERM GOAL #2   Title  Patient to report pain reduction in frequency and intensity by >/= 25%    Status  Achieved   08/23/19       PT Long Term Goals - 07/31/19 1313      PT LONG TERM GOAL #1   Title  Patient will be independent with ongoing/advanced HEP    Status  On-going      PT LONG TERM GOAL #2   Title  Patient to demonstrate appropriate posture and body mechanics needed for daily activities    Status  On-going      PT LONG TERM GOAL #3   Title  Patient to report pain reduction in frequency and intensity by >/= 50%    Status  On-going      PT LONG TERM GOAL #4   Title  Patient to report ability to perform ADLs, household and work-related tasks without increased pain    Status  On-going      PT LONG TERM GOAL #5   Title  Patient will report ability to complete his wood working projects with decreased low back pain interference    Status  On-going            Plan - 08/23/19 0921    Clinical Impression Statement  Thomas [name] received the injection in his back with some initial relief noted (able to sleep in the next morning w/o waking due to pain) but otherwise no significant change in his pain since injection. He reports 25% improvement in pain thus far with PT with average pain reports down to 7/10 from 9/10 - STG #2 met. He continues to localize  pain predominantly to R low back with increased muscle tension persisting in B lumbar paraspinals, R QL as well as localized ttp with TP identified in R glute medius/minimus - these  areas were addressed with manual therapy including DN with good twitch responses elicited and palpable reduction in muscle tension although patient not noting much change in pain level. Progressed strengthening exercises to include further hip and low back/core strengthening in standing with good tolerance reported. Thomas [name] will continue to benefit from skilled PT to further address abnormal muscle tension and lumbopelvic strengthening to reduce/minimize LBP.    Personal Factors and Comorbidities  Age;Comorbidity 3+;Fitness;Time since onset of injury/illness/exacerbation    Comorbidities  HTN, CAD, CHF, DM-II, peripheral neuropathy, OA s/p R TKA, obesity, depression; further PMHx as above    Examination-Activity Limitations  Lift;Carry;Locomotion Level;Sit;Stand;Transfers;Squat;Stairs    Examination-Participation Restrictions  Community Activity;Yard Work    [employer] Potential  Fair    PT Frequency  2x / week    PT Duration  8 weeks    PT Treatment/Interventions  ADLs/Self Care Home Management;Cryotherapy;Electrical Stimulation;Iontophoresis 71m/ml Dexamethasone;Moist Heat;Traction;Gait training;Functional mobility training;Therapeutic activities;Therapeutic exercise;Balance training;Neuromuscular re-education;Patient/family education;Manual techniques;Passive range of motion;Dry needling;Taping;Spinal Manipulations;Joint Manipulations    PT Next Visit Plan  lumbopelvic flexiblity and strengthening; manual therapy including DN as indicated; modalities PRN; review HEP as indicated; address any questions/concerns re: posture and body mechanics education    PT Home Exercise Plan  07/25/19 -  LTR, piriformis & HS stretches, seated prayer stretch; 08/02/19 - modified supine SKTC/KTOS piriformis stretch with towel, lumbopelvic stabilization/strengthening (pelvic tilt, brace march, bent-knee fall-out); 08/14/19 - standing abd bracing + rows/retraction with red TB, seated hip flexor/quad stretch    Consulted and Agree  with Plan of Care  Patient       Patient will benefit from skilled therapeutic intervention in order to improve the following deficits and impairments:  Cardiopulmonary status limiting activity, Decreased activity tolerance, Decreased balance, Decreased endurance, Decreased knowledge of precautions, Decreased mobility, Decreased range of motion, Decreased strength, Difficulty walking, Increased muscle spasms, Impaired flexibility, Impaired sensation  Visit Diagnosis: Chronic right-sided low back pain with right-sided sciatica  Muscle spasm of back  Muscle weakness (generalized)     Problem List Patient Active Problem List   Diagnosis Date Noted  . Coronary artery disease 02/11/2018  . Claudication in peripheral vascular disease ([place] 02/11/2018  . Atypical chest pain 01/12/2018  . Precordial chest pain 01/07/2018  . Coronary artery calcification 01/07/2018  . CKD (chronic kidney disease) stage 3, GFR 30-59 ml/min 11/25/2017  . Chronic diastolic heart failure ([place] 05/13/2017  . Aortic stenosis, mild 05/13/2017  . Low back pain 05/12/2017  . GERD (gastroesophageal reflux disease) 05/12/2017  . Prostate nodule 05/12/2017  . Obesity 05/12/2017  . Depression 05/12/2017  . Hx of colonic polyps 05/12/2017  . 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 Type 2 diabetes mellitus ([place] 05/12/2017  . Hyperlipidemia 05/12/2017  . Hypertensive heart disease with heart failure (HLake [place] 05/12/2017  . Osteoarthritis of knee 05/12/2017  . Sleep apnea 05/12/2017  . Actinic keratosis 05/12/2017  . 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 Seborrheic dermatitis 05/12/2017  . Paresthesia 05/12/2017    JPercival Spanish PT, MPT 08/23/2019, 10:24 AM  [facility] [place] [address]Phone: [phone]  Fax:  [phone] Name: BMERRICK MAGGIOMRN: [id number] of Birth:  104/27/1947

## 2019-08-28 ENCOUNTER — Ambulatory Visit: Payer: Medicare Other | Admitting: Physical Therapy

## 2019-08-28 ENCOUNTER — Other Ambulatory Visit: Payer: Self-pay

## 2019-08-28 ENCOUNTER — Encounter: Payer: Self-pay | Admitting: Physical Therapy

## 2019-08-28 DIAGNOSIS — G8929 Other chronic pain: Secondary | ICD-10-CM

## 2019-08-28 DIAGNOSIS — M62830 Muscle spasm of back: Secondary | ICD-10-CM

## 2019-08-28 DIAGNOSIS — M6281 Muscle weakness (generalized): Secondary | ICD-10-CM

## 2019-08-28 DIAGNOSIS — M5441 Lumbago with sciatica, right side: Secondary | ICD-10-CM | POA: Diagnosis not present

## 2019-08-28 NOTE — Therapy (Signed)
[facility] [address]  [place], [place], [address] Phone: [phone]   Fax:  [phone]  Physical Therapy Treatment  Patient Details  Name: Thomas [name] MRN: [id number] Date of Birth: 1946/07/23 Referring Provider (PT): Janett Billow, MD   Encounter Date: 08/28/2019  PT End of Session - 08/28/19 0928    Visit Number  8    Number of Visits  15    Date for PT Re-Evaluation  09/19/19    Authorization Type  Medicare & VA (15 visits)    Authorization - Visit Number  8    Authorization - Number of Visits  15    PT Start Time  [phone]    PT Stop Time  1018    PT Time Calculation (min)  50 min    Activity Tolerance  Patient tolerated treatment well    Behavior During Therapy  [facility] for tasks assessed/performed       Past Medical History:  Diagnosis Date  . Actinic keratosis 05/12/2017  . Back pain   . Depression 05/12/2017  . Diabetes ([place])   . GERD (gastroesophageal reflux disease) 05/12/2017  . Hx of colonic polyps 05/12/2017  . Hyperlipidemia 05/12/2017  . Hypertension   . 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 Low back pain 05/12/2017  . Lumbar radiculopathy 05/12/2017  . 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 Obesity 05/12/2017  . Osteoarthritis of knee 05/12/2017  . Paresthesia 05/12/2017  . 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 Prostate nodule 05/12/2017  . Seborrheic dermatitis 05/12/2017    Past Surgical History:  Procedure Laterality Date  . HAND SURGERY    . REPLACEMENT TOTAL KNEE Right   . RIGHT/LEFT HEART CATH AND CORONARY ANGIOGRAPHY N/A 01/12/2018   Procedure: RIGHT/LEFT HEART CATH AND CORONARY ANGIOGRAPHY;  Surgeon: Belva Crome, MD;  Location: [facility];  Service: Cardiovascular;  Laterality: N/A;  . SHOULDER SURGERY Right     There were no vitals filed for this visit.  Subjective Assessment - 08/28/19 0931    Subjective  Pt reporting he thinks his pain is a little bit better today ("the best it's been so  far") and his side "doesn't feel half as bad as it ususally does", but notes his legs feel weak this morning.    Pertinent History  R TKA    Patient Stated Goals  "to get where I can function"    Currently in Pain?  Yes    Pain Score  7     Pain Location  Back    Pain Orientation  Lower;Right    Pain Descriptors / Indicators  Stabbing    Pain Type  Chronic pain    Pain Frequency  Constant                       [facility] Adult PT Treatment/Exercise - 08/28/19 0928      Exercises   Exercises  Lumbar      Lumbar Exercises: Stretches   Lower Trunk Rotation  5 reps;10 seconds      Lumbar Exercises: Aerobic   Nustep  L4 x 6 min (UE/LE)      Lumbar Exercises: Seated   Long Arc Quad on [place]  Both;10 reps    LAQ on [place] Limitations  seated on dynadisc on mat table    Hip Flexion on Ball  Both;10 reps    Hip Flexion on Ball Limitations  seated on dynadisc on mat table  Other Seated Lumbar Exercises  Thomas rows, shoulder extension & R/L pallof press with red TB x 10 each; seated on dynadisc on mat table      Lumbar Exercises: Supine   Clam  10 reps;3 seconds    Clam Limitations  abd bracing + alt hip ABD/ER with red TB - cues to avoid    Bent Knee Raise  10 reps;3 seconds    Bent Knee Raise Limitations  brace marching with red TB    Dead Bug  10 reps;3 seconds    Dead Bug Limitations  LE only    Bridge  10 reps   slightly limited ROM   Bridge Limitations  + hip ABD isometric into red TB - cues for rhythmic breathing and proper set up    [employer] Abdominal Isometric  10 reps;3 seconds    Large Ball Abdominal Isometric Limitations  orange Pball     Large Ball Oblique Isometric  10 reps;3 seconds    Large Ball Oblique Isometric Limitations  orange Pball       Lumbar Exercises: Sidelying   Other Sidelying Lumbar Exercises  Open book stretch 10 x 5"      Modalities   Modalities  Moist Heat      Moist Heat Therapy   Number Minutes Moist Heat  10 Minutes    Moist Heat  Location  Lumbar Spine   & R flank              PT Short Term Goals - 08/23/19 0920      PT SHORT TERM GOAL #1   Title  Patient will be independent with initial HEP    Status  Achieved   08/16/19     PT SHORT TERM GOAL #2   Title  Patient to report pain reduction in frequency and intensity by >/= 25%    Status  Achieved   08/23/19       PT Long Term Goals - 07/31/19 1313      PT LONG TERM GOAL #1   Title  Patient will be independent with ongoing/advanced HEP    Status  On-going      PT LONG TERM GOAL #2   Title  Patient to demonstrate appropriate posture and body mechanics needed for daily activities    Status  On-going      PT LONG TERM GOAL #3   Title  Patient to report pain reduction in frequency and intensity by >/= 50%    Status  On-going      PT LONG TERM GOAL #4   Title  Patient to report ability to perform ADLs, household and work-related tasks without increased pain    Status  On-going      PT LONG TERM GOAL #5   Title  Patient will report ability to complete his wood working projects with decreased low back pain interference    Status  On-going            Plan - 08/28/19 0936    Clinical Impression Statement  Thomas [name] reporting pain at "it's best level yet" with decreased intensity when "stabbing" pain in R side does occur. Able to progress supine core and lumbopelvic strengthening including increasing oblique activation without increased pain, however patient reporting brief "stabbing" stabbing pain in R flank after completing seated pallof press. Treatment concluded with moist heat pack to R flank as patient noting some lingering tightness at site of pain.    Personal Factors and Comorbidities  Age;Comorbidity 3+;Fitness;Time since onset of injury/illness/exacerbation    Comorbidities  HTN, CAD, CHF, DM-II, peripheral neuropathy, OA s/p R TKA, obesity, depression; further PMHx as above    Examination-Activity Limitations  Lift;Carry;Locomotion  Level;Sit;Stand;Transfers;Squat;Stairs    Examination-Participation Restrictions  Community Activity;Yard Work    [employer] Potential  Fair    PT Frequency  2x / week    PT Duration  8 weeks    PT Treatment/Interventions  ADLs/Self Care Home Management;Cryotherapy;Electrical Stimulation;Iontophoresis 21m/ml Dexamethasone;Moist Heat;Traction;Gait training;Functional mobility training;Therapeutic activities;Therapeutic exercise;Balance training;Neuromuscular re-education;Patient/family education;Manual techniques;Passive range of motion;Dry needling;Taping;Spinal Manipulations;Joint Manipulations    PT Next Visit Plan  lumbopelvic flexiblity and strengthening; manual therapy including DN as indicated; modalities PRN; review HEP as indicated; address any questions/concerns re: posture and body mechanics education    PT Home Exercise Plan  07/25/19 -  LTR, piriformis & HS stretches, seated prayer stretch; 08/02/19 - modified supine SKTC/KTOS piriformis stretch with towel, lumbopelvic stabilization/strengthening (pelvic tilt, brace march, bent-knee fall-out); 08/14/19 - standing abd bracing + rows/retraction with red TB, seated hip flexor/quad stretch    Consulted and Agree with Plan of Care  Patient       Patient will benefit from skilled therapeutic intervention in order to improve the following deficits and impairments:  Cardiopulmonary status limiting activity, Decreased activity tolerance, Decreased balance, Decreased endurance, Decreased knowledge of precautions, Decreased mobility, Decreased range of motion, Decreased strength, Difficulty walking, Increased muscle spasms, Impaired flexibility, Impaired sensation  Visit Diagnosis: Chronic right-sided low back pain with right-sided sciatica  Muscle spasm of back  Muscle weakness (generalized)     Problem [name] Patient Active Problem [name]   Diagnosis Date Noted  . Coronary artery disease 02/11/2018  . Claudication in peripheral vascular disease  (HCas[place] 02/11/2018  . Atypical chest pain 01/12/2018  . Precordial chest pain 01/07/2018  . Coronary artery calcification 01/07/2018  . CKD (chronic kidney disease) stage 3, GFR 30-59 ml/min 11/25/2017  . Chronic diastolic heart failure (HPort [place] 05/13/2017  . Aortic stenosis, mild 05/13/2017  . Low back pain 05/12/2017  . GERD (gastroesophageal reflux disease) 05/12/2017  . Prostate nodule 05/12/2017  . Obesity 05/12/2017  . Depression 05/12/2017  . Hx of colonic polyps 05/12/2017  . 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 Type 2 diabetes mellitus (HAnthoston 05/12/2017  . Hyperlipidemia 05/12/2017  . Hypertensive heart disease with heart failure (HCl[place] 05/12/2017  . Osteoarthritis of knee 05/12/2017  . Sleep apnea 05/12/2017  . Actinic keratosis 05/12/2017  . 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 Seborrheic dermatitis 05/12/2017  . Paresthesia 05/12/2017    JPercival Spanish PT, MPT 08/28/2019, 10:14 AM  [facility] [place] [address]Phone: [phone]  Fax:  [phone] Name: BGILMORE LISTMRN: [id number] of Birth: 102/07/1946

## 2019-08-30 ENCOUNTER — Encounter: Payer: Self-pay | Admitting: Physical Therapy

## 2019-08-30 ENCOUNTER — Other Ambulatory Visit: Payer: Self-pay

## 2019-08-30 ENCOUNTER — Ambulatory Visit: Payer: Medicare Other | Admitting: Physical Therapy

## 2019-08-30 DIAGNOSIS — G8929 Other chronic pain: Secondary | ICD-10-CM

## 2019-08-30 DIAGNOSIS — M6281 Muscle weakness (generalized): Secondary | ICD-10-CM

## 2019-08-30 DIAGNOSIS — M62830 Muscle spasm of back: Secondary | ICD-10-CM

## 2019-08-30 DIAGNOSIS — M5441 Lumbago with sciatica, right side: Secondary | ICD-10-CM | POA: Diagnosis not present

## 2019-08-30 NOTE — Therapy (Signed)
[facility] [address]  [place], [place], [address] Phone: [phone]   Fax:  [phone]  Physical Therapy Treatment  Patient Details  Name: Thomas [name] MRN: [id number] Date of Birth: 09-10-1945 Referring Provider (PT): Janett Billow, MD   Encounter Date: 08/30/2019  PT End of Session - 08/30/19 0936    Visit Number  9    Number of Visits  15    Date for PT Re-Evaluation  09/19/19    Authorization Type  Medicare & VA (15 visits)    Authorization - Visit Number  9    Authorization - Number of Visits  15    PT Start Time  [phone]    PT Stop Time  1016    PT Time Calculation (min)  40 min    Activity Tolerance  Patient tolerated treatment well    Behavior During Therapy  [facility] for tasks assessed/performed       Past Medical History:  Diagnosis Date  . Actinic keratosis 05/12/2017  . Back pain   . Depression 05/12/2017  . Diabetes ([place])   . GERD (gastroesophageal reflux disease) 05/12/2017  . Hx of colonic polyps 05/12/2017  . Hyperlipidemia 05/12/2017  . Hypertension   . 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 Low back pain 05/12/2017  . Lumbar radiculopathy 05/12/2017  . 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 Obesity 05/12/2017  . Osteoarthritis of knee 05/12/2017  . Paresthesia 05/12/2017  . 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 Prostate nodule 05/12/2017  . Seborrheic dermatitis 05/12/2017    Past Surgical History:  Procedure Laterality Date  . HAND SURGERY    . REPLACEMENT TOTAL KNEE Right   . RIGHT/LEFT HEART CATH AND CORONARY ANGIOGRAPHY N/A 01/12/2018   Procedure: RIGHT/LEFT HEART CATH AND CORONARY ANGIOGRAPHY;  Surgeon: Belva Crome, MD;  Location: [facility];  Service: Cardiovascular;  Laterality: N/A;  . SHOULDER SURGERY Right     There were no vitals filed for this visit.  Subjective Assessment - 08/30/19 0939    Subjective  Pt reporting increased R side/flank area today - states it wasn't too bad when he got  up this morning but seems to have gotten worse.    Pertinent History  R TKA    Patient Stated Goals  "to get where I can function"    Currently in Pain?  Yes    Pain Score  9     Pain Location  Flank    Pain Orientation  Right    Pain Descriptors / Indicators  Stabbing;Pressure    Pain Type  Chronic pain    Pain Frequency  Constant                       [facility] Adult PT Treatment/Exercise - 08/30/19 0936      Exercises   Exercises  Lumbar      Lumbar Exercises: Stretches   Hip Flexor Stretch  Right;30 seconds;60 seconds;2 reps   each   Hip Flexor Stretch Limitations  mod thomas with slight overpressure from PT (30 sec each) & manual stretch by PT in side lying (60 sec)    Other Lumbar Stretch Exercise  Side lying QL stretches with manual assist from PT 2 x 30 sec - 1st rep with hip extended and R LE dropping behind over edge of plinth, 2nd rep with hips flexed and rotated over front edge of plinth      Lumbar Exercises: Aerobic  Nustep  L4 x 6 min (UE/LE)      Lumbar Exercises: Supine   Bent Knee Raise  10 reps;3 seconds   2 sets   Bent Knee Raise Limitations  brace marching      Manual Therapy   Manual Therapy  Soft tissue mobilization;Myofascial release;Passive ROM    Manual therapy comments  L side lying    Soft tissue mobilization  STM/DTM to R proximal iliacus, QL and glutes - very sensitive/ttp over iliacus and to a lesser degree over QL but denies ttp over glutes    Myofascial Release  MFR & manual TPR to R proximal iliacus & QL    Passive ROM  manual R hip flexor & QL stretches x 30-60 sec       Trigger Point Dry Needling - 08/30/19 0936    Consent Given?  Yes    Muscles Treated Back/Hip  Iliopsoas;Quadratus lumborum    Iliopsoas Response  Twitch response elicited;Palpable increased muscle length   R proximal iliacus   Quadratus Lumborum Response  Twitch response elicited;Palpable increased muscle length   Right            PT Short Term  Goals - 08/23/19 0920      PT SHORT TERM GOAL #1   Title  Patient will be independent with initial HEP    Status  Achieved   08/16/19     PT SHORT TERM GOAL #2   Title  Patient to report pain reduction in frequency and intensity by >/= 25%    Status  Achieved   08/23/19       PT Long Term Goals - 07/31/19 1313      PT LONG TERM GOAL #1   Title  Patient will be independent with ongoing/advanced HEP    Status  On-going      PT LONG TERM GOAL #2   Title  Patient to demonstrate appropriate posture and body mechanics needed for daily activities    Status  On-going      PT LONG TERM GOAL #3   Title  Patient to report pain reduction in frequency and intensity by >/= 50%    Status  On-going      PT LONG TERM GOAL #4   Title  Patient to report ability to perform ADLs, household and work-related tasks without increased pain    Status  On-going      PT LONG TERM GOAL #5   Title  Patient will report ability to complete his wood working projects with decreased low back pain interference    Status  On-going            Plan - 08/30/19 0942    Clinical Impression Statement  Bill arriving to PT today requesting DN for increased R flank/side pain worsening this morning without known trigger. Palpation revealing significant ttp and guarding/increased muscle tension in proximal lateral R iliacus and to a lesser extent in R QL - both addressed with manual STM/MFR and TPR along with DN with strong twitch response in multiple locations in iliacus with palpable reduction in muscle tension and ttp over both muscle groups. Manual therapy followed by PT assisted stretching for involved muscle along with brace marching to promote lumbar stabilization with normalized activation of hip flexors. Upon rising from treatment table patient reporting resolution of pain and denying need for any modalities today.    Personal Factors and Comorbidities  Age;Comorbidity 3+;Fitness;Time since onset of  injury/illness/exacerbation    Comorbidities  HTN, CAD, CHF, DM-II, peripheral neuropathy, OA s/p R TKA, obesity, depression; further PMHx as above    Examination-Activity Limitations  Lift;Carry;Locomotion Level;Sit;Stand;Transfers;Squat;Stairs    Examination-Participation Restrictions  Community Activity;Yard Work    [employer] Potential  Fair    PT Frequency  2x / week    PT Duration  8 weeks    PT Treatment/Interventions  ADLs/Self Care Home Management;Cryotherapy;Electrical Stimulation;Iontophoresis 90m/ml Dexamethasone;Moist Heat;Traction;Gait training;Functional mobility training;Therapeutic activities;Therapeutic exercise;Balance training;Neuromuscular re-education;Patient/family education;Manual techniques;Passive range of motion;Dry needling;Taping;Spinal Manipulations;Joint Manipulations    PT Next Visit Plan  10th visit PN; lumbopelvic flexiblity and strengthening; manual therapy including DN as indicated; modalities PRN; review HEP as indicated; address any questions/concerns re: posture and body mechanics education    PT Home Exercise Plan  07/25/19 -  LTR, piriformis & HS stretches, seated prayer stretch; 08/02/19 - modified supine SKTC/KTOS piriformis stretch with towel, lumbopelvic stabilization/strengthening (pelvic tilt, brace march, bent-knee fall-out); 08/14/19 - standing abd bracing + rows/retraction with red TB, seated hip flexor/quad stretch    Consulted and Agree with Plan of Care  Patient       Patient will benefit from skilled therapeutic intervention in order to improve the following deficits and impairments:  Cardiopulmonary status limiting activity, Decreased activity tolerance, Decreased balance, Decreased endurance, Decreased knowledge of precautions, Decreased mobility, Decreased range of motion, Decreased strength, Difficulty walking, Increased muscle spasms, Impaired flexibility, Impaired sensation  Visit Diagnosis: Chronic right-sided low back pain with right-sided  sciatica  Muscle spasm of back  Muscle weakness (generalized)     Problem List Patient Active Problem List   Diagnosis Date Noted  . Coronary artery disease 02/11/2018  . Claudication in peripheral vascular disease ([place] 02/11/2018  . Atypical chest pain 01/12/2018  . Precordial chest pain 01/07/2018  . Coronary artery calcification 01/07/2018  . CKD (chronic kidney disease) stage 3, GFR 30-59 ml/min 11/25/2017  . Chronic diastolic heart failure ([place] 05/13/2017  . Aortic stenosis, mild 05/13/2017  . Low back pain 05/12/2017  . GERD (gastroesophageal reflux disease) 05/12/2017  . Prostate nodule 05/12/2017  . Obesity 05/12/2017  . Depression 05/12/2017  . Hx of colonic polyps 05/12/2017  . 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 Type 2 diabetes mellitus ([place] 05/12/2017  . Hyperlipidemia 05/12/2017  . Hypertensive heart disease with heart failure ([place] 05/12/2017  . Osteoarthritis of knee 05/12/2017  . Sleep apnea 05/12/2017  . Actinic keratosis 05/12/2017  . 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 Seborrheic dermatitis 05/12/2017  . Paresthesia 05/12/2017    JPercival Spanish PT, MPT 08/30/2019, 10:55 AM  [facility] [place] [address]Phone: [phone]  Fax:  [phone] Name: Thomas SPOSITOMRN: [id number] of Birth: [birth year]

## 2019-09-04 ENCOUNTER — Other Ambulatory Visit: Payer: Self-pay

## 2019-09-04 ENCOUNTER — Encounter: Payer: Self-pay | Admitting: Physical Therapy

## 2019-09-04 ENCOUNTER — Ambulatory Visit: Payer: Medicare Other | Admitting: Physical Therapy

## 2019-09-04 DIAGNOSIS — M6281 Muscle weakness (generalized): Secondary | ICD-10-CM

## 2019-09-04 DIAGNOSIS — M62830 Muscle spasm of back: Secondary | ICD-10-CM

## 2019-09-04 DIAGNOSIS — M5441 Lumbago with sciatica, right side: Secondary | ICD-10-CM | POA: Diagnosis not present

## 2019-09-04 DIAGNOSIS — G8929 Other chronic pain: Secondary | ICD-10-CM

## 2019-09-04 NOTE — Therapy (Signed)
[facility] [address]  [place], [place], [address] Phone: [phone]   Fax:  [phone]  Physical Therapy Progress Note  Patient Details  Name: Thomas [name] MRN: [id number] Date of Birth: 1946/07/13 Referring Provider (PT): Janett Billow, MD   Progress Note Reporting Period 07/25/19 to 09/04/19  See note below for Objective Data and Assessment of Progress/Goals.    Encounter Date: 09/04/2019  PT End of Session - 09/04/19 1022    Visit Number  10    Number of Visits  15    Date for PT Re-Evaluation  09/19/19    Authorization Type  Medicare & VA (15 visits)    Authorization - Visit Number  10    Authorization - Number of Visits  15    PT Start Time  0930    PT Stop Time  1015    PT Time Calculation (min)  45 min    Activity Tolerance  Patient tolerated treatment well    Behavior During Therapy  WFL for tasks assessed/performed       Past Medical History:  Diagnosis Date  . Actinic keratosis 05/12/2017  . Back pain   . Depression 05/12/2017  . Diabetes ([place])   . GERD (gastroesophageal reflux disease) 05/12/2017  . Hx of colonic polyps 05/12/2017  . Hyperlipidemia 05/12/2017  . Hypertension   . 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 Low back pain 05/12/2017  . Lumbar radiculopathy 05/12/2017  . 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 Obesity 05/12/2017  . Osteoarthritis of knee 05/12/2017  . Paresthesia 05/12/2017  . 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 Prostate nodule 05/12/2017  . Seborrheic dermatitis 05/12/2017    Past Surgical History:  Procedure Laterality Date  . HAND SURGERY    . REPLACEMENT TOTAL KNEE Right   . RIGHT/LEFT HEART CATH AND CORONARY ANGIOGRAPHY N/A 01/12/2018   Procedure: RIGHT/LEFT HEART CATH AND CORONARY ANGIOGRAPHY;  Surgeon: Belva Crome, MD;  Location: [facility];  Service: Cardiovascular;  Laterality: N/A;  . SHOULDER SURGERY Right     There were no vitals filed for this  visit.  Subjective Assessment - 09/04/19 0933    Subjective  Feeling better over the holiday- not sure if it is the DN done last session or not. Reports 50% progress thus far. Feels that he is able to sit and walk a little longer.    Pertinent History  R TKA    Patient Stated Goals  "to get where I can function"    Currently in Pain?  Yes    Pain Score  5     Pain Location  Flank    Pain Orientation  Right    Pain Descriptors / Indicators  Stabbing;Pressure    Pain Type  Chronic pain         [facility] PT Assessment - 09/04/19 0001      Observation/Other Assessments   Focus on Therapeutic Outcomes (FOTO)   Lumbar - 40% (60% limitation); Predicted 50% (50% limitation)                   [facility] Adult PT Treatment/Exercise - 09/04/19 0001      Lumbar Exercises: Stretches   Lower Trunk Rotation Limitations  x20 to tolerance    Piriformis Stretch  Right;Left;1 rep;30 seconds    Piriformis Stretch Limitations  supine KTOS    Figure 4 Stretch  1 rep;30 seconds    Figure 4 Stretch Limitations  supine  Other Lumbar Stretch Exercise  R QL stretch in doorway 2x30"      Lumbar Exercises: Aerobic   Nustep  L4 x 6 min (UE/LE)      Lumbar Exercises: Seated   Other Seated Lumbar Exercises  anterior/posterior pelvic tilts x10   limited ROM but good effort   Other Seated Lumbar Exercises  R/L prayer stretch with green pball 5x3" each      Manual Therapy   Manual Therapy  Soft tissue mobilization;Myofascial release;Passive ROM    Manual therapy comments  L side lying    Soft tissue mobilization  STM/DTM to R proximal iliacus, QL and obliques- palpable soft tissue restriction throughout; c/o tenderness    Myofascial Release  manual TPR to R proximal iliacus and obliques- jump sign and tenderness             PT Education - 09/04/19 1022    Education Details  update to HEP; discussion on objective progress and remaining limitations    Person(s) Educated  Patient    Methods   Explanation;Demonstration;Tactile cues;Verbal cues;Handout    Comprehension  Verbalized understanding;Returned demonstration       PT Short Term Goals - 09/04/19 0942      PT SHORT TERM GOAL #1   Title  Patient will be independent with initial HEP    Status  Achieved   08/16/19     PT SHORT TERM GOAL #2   Title  Patient to report pain reduction in frequency and intensity by >/= 25%    Status  Achieved   08/23/19       PT Long Term Goals - 09/04/19 0942      PT LONG TERM GOAL #1   Title  Patient will be independent with ongoing/advanced HEP    Status  On-going   reporting good consistency; no questions on HEP     PT LONG TERM GOAL #2   Title  Patient to demonstrate appropriate posture and body mechanics needed for daily activities    Status  Partially Met   "not sure;" reporting less slouching and trying to use tips given to him     PT LONG TERM GOAL #3   Title  Patient to report pain reduction in frequency and intensity by >/= 50%    Status  Achieved   reporting 50% improvement     PT LONG TERM GOAL #4   Title  Patient to report ability to perform ADLs, household and work-related tasks without increased pain    Status  On-going   reporting that pain levels rise with activities that require getting down low like looking under the sink; also somewhat limited by B knee pain     PT LONG TERM GOAL #5   Title  Patient will report ability to complete his wood working projects with decreased low back pain interference    Status  On-going   has not attempted d/t fear of pain returning           Plan - 09/04/19 1036    Clinical Impression Statement  Patient reporting 50% improvement in pain levels since initial eval. Feels that he is able to sit and walk a little longer than before. Noting that he is feeling better since last session, feels that DN may have contributed to this. Patient unsure if his posture is improving, but notes that he is trying to slouch less often.  Pain severity/frequency goal met at this time. Still noting some increase in pain with  household chores requiring bending down such as to fix a sink. Notes that he has held off on trying woodworking d/t fear of pain returning. Worked on gentle lumbopelvic stretching and ROM this session. Patient tolerated most exercises well. Ended session with STM/STM and TPR to R iliacus, QL, and obliques. Patient reported good pain relief after manual therapy, and requesting to try DN to these muscles again next session. Patient demonstrating progress towards goals. Would benefit from continued skilled PT services to return to pain-free function.    Personal Factors and Comorbidities  Age;Comorbidity 3+;Fitness;Time since onset of injury/illness/exacerbation    Comorbidities  HTN, CAD, CHF, DM-II, peripheral neuropathy, OA s/p R TKA, obesity, depression; further PMHx as above    Examination-Activity Limitations  Lift;Carry;Locomotion Level;Sit;Stand;Transfers;Squat;Stairs    Examination-Participation Restrictions  Community Activity;Yard Work    [employer] Potential  Fair    PT Frequency  2x / week    PT Duration  8 weeks    PT Treatment/Interventions  ADLs/Self Care Home Management;Cryotherapy;Electrical Stimulation;Iontophoresis 57m/ml Dexamethasone;Moist Heat;Traction;Gait training;Functional mobility training;Therapeutic activities;Therapeutic exercise;Balance training;Neuromuscular re-education;Patient/family education;Manual techniques;Passive range of motion;Dry needling;Taping;Spinal Manipulations;Joint Manipulations    PT Next Visit Plan  lumbopelvic flexiblity and strengthening; manual therapy including DN as indicated; modalities PRN; review HEP as indicated; address any questions/concerns re: posture and body mechanics education    PT Home Exercise Plan  07/25/19 -  LTR, piriformis & HS stretches, seated prayer stretch; 08/02/19 - modified supine SKTC/KTOS piriformis stretch with towel, lumbopelvic  stabilization/strengthening (pelvic tilt, brace march, bent-knee fall-out); 08/14/19 - standing abd bracing + rows/retraction with red TB, seated hip flexor/quad stretch    Consulted and Agree with Plan of Care  Patient       Patient will benefit from skilled therapeutic intervention in order to improve the following deficits and impairments:  Cardiopulmonary status limiting activity, Decreased activity tolerance, Decreased balance, Decreased endurance, Decreased knowledge of precautions, Decreased mobility, Decreased range of motion, Decreased strength, Difficulty walking, Increased muscle spasms, Impaired flexibility, Impaired sensation  Visit Diagnosis: Chronic right-sided low back pain with right-sided sciatica  Muscle spasm of back  Muscle weakness (generalized)     Problem List Patient Active Problem List   Diagnosis Date Noted  . Coronary artery disease 02/11/2018  . Claudication in peripheral vascular disease ([place] 02/11/2018  . Atypical chest pain 01/12/2018  . Precordial chest pain 01/07/2018  . Coronary artery calcification 01/07/2018  . CKD (chronic kidney disease) stage 3, GFR 30-59 ml/min 11/25/2017  . Chronic diastolic heart failure ([place] 05/13/2017  . Aortic stenosis, mild 05/13/2017  . Low back pain 05/12/2017  . GERD (gastroesophageal reflux disease) 05/12/2017  . Prostate nodule 05/12/2017  . Obesity 05/12/2017  . Depression 05/12/2017  . Hx of colonic polyps 05/12/2017  . 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 Type 2 diabetes mellitus ([place] 05/12/2017  . Hyperlipidemia 05/12/2017  . Hypertensive heart disease with heart failure ([place] 05/12/2017  . Osteoarthritis of knee 05/12/2017  . Sleep apnea 05/12/2017  . Actinic keratosis 05/12/2017  . 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 Seborrheic dermatitis 05/12/2017  . Paresthesia 05/12/2017     YJanene Harvey PT, DPT 09/04/19 10:38 AM   [facility] [place] [address]Phone: [phone]  Fax:  [phone] Name: BABHI MOCCIAMRN: [id number] of Birth: [birth year]

## 2019-09-06 ENCOUNTER — Encounter: Payer: Self-pay | Admitting: Physical Therapy

## 2019-09-06 ENCOUNTER — Other Ambulatory Visit: Payer: Self-pay

## 2019-09-06 ENCOUNTER — Ambulatory Visit: Payer: Medicare Other | Admitting: Physical Therapy

## 2019-09-06 DIAGNOSIS — M5441 Lumbago with sciatica, right side: Secondary | ICD-10-CM

## 2019-09-06 DIAGNOSIS — G8929 Other chronic pain: Secondary | ICD-10-CM

## 2019-09-06 DIAGNOSIS — M6281 Muscle weakness (generalized): Secondary | ICD-10-CM

## 2019-09-06 DIAGNOSIS — M62830 Muscle spasm of back: Secondary | ICD-10-CM

## 2019-09-06 NOTE — Therapy (Signed)
[facility] [address]  [place], [place], [address] Phone: [phone]   Fax:  [phone]  Physical Therapy Treatment  Patient Details  Name: Thomas [name] MRN: [id number] Date of Birth: 30-Apr-1946 Referring Provider (PT): Janett Billow, MD   Encounter Date: 09/06/2019  PT End of Session - 09/06/19 0939    Visit Number  11    Number of Visits  15    Date for PT Re-Evaluation  09/19/19    Authorization Type  Medicare & VA (15 visits)    Authorization - Visit Number  11    Authorization - Number of Visits  15    PT Start Time  [phone]    PT Stop Time  1017    PT Time Calculation (min)  38 min    Activity Tolerance  Patient tolerated treatment well    Behavior During Therapy  [facility] for tasks assessed/performed       Past Medical History:  Diagnosis Date  . Actinic keratosis 05/12/2017  . Back pain   . Depression 05/12/2017  . Diabetes ([place])   . GERD (gastroesophageal reflux disease) 05/12/2017  . Hx of colonic polyps 05/12/2017  . Hyperlipidemia 05/12/2017  . Hypertension   . 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 Low back pain 05/12/2017  . Lumbar radiculopathy 05/12/2017  . 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 Obesity 05/12/2017  . Osteoarthritis of knee 05/12/2017  . Paresthesia 05/12/2017  . 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 Prostate nodule 05/12/2017  . Seborrheic dermatitis 05/12/2017    Past Surgical History:  Procedure Laterality Date  . HAND SURGERY    . REPLACEMENT TOTAL KNEE Right   . RIGHT/LEFT HEART CATH AND CORONARY ANGIOGRAPHY N/A 01/12/2018   Procedure: RIGHT/LEFT HEART CATH AND CORONARY ANGIOGRAPHY;  Surgeon: Belva Crome, MD;  Location: [facility];  Service: Cardiovascular;  Laterality: N/A;  . SHOULDER SURGERY Right     There were no vitals filed for this visit.  Subjective Assessment - 09/06/19 0944    Subjective  Patient requesting DN again today as he feels that it has helped with his side/flank  pain.    Pertinent History  R TKA    Patient Stated Goals  "to get where I can function"    Currently in Pain?  Yes    Pain Score  4     Pain Location  Flank    Pain Orientation  Right    Pain Descriptors / Indicators  Stabbing;Pressure    Pain Type  Chronic pain    Pain Frequency  Constant                       [facility] Adult PT Treatment/Exercise - 09/06/19 0939      Exercises   Exercises  Lumbar      Lumbar Exercises: Stretches   Lower Trunk Rotation Limitations  x20 to tolerance    Hip Flexor Stretch  Right;30 seconds;60 seconds;2 reps   each   Hip Flexor Stretch Limitations  mod thomas with slight overpressure from PT (30 sec each) & manual stretch by PT in side lying (60 sec)    Other Lumbar Stretch Exercise  Side lying QL stretches with manual assist from PT 2 x 30 sec - (hip extended and R LE dropping behind over edge of plinth with arm reaching overhead)      Lumbar Exercises: Aerobic   Recumbent Bike  L1 x 3.5 min  Manual Therapy   Manual Therapy  Soft tissue mobilization;Myofascial release;Passive ROM    Manual therapy comments  L side lying    Soft tissue mobilization  STM/DTM to R proximal iliacus, QL and obliques - palpable soft tissue restriction throughout; c/o tenderness    Myofascial Release  MFR & manual TPR to R proximal iliacus, obliques & QL    Passive ROM  manual R hip flexor & QL stretches x 30-60 sec       Trigger Point Dry Needling - 09/06/19 0939    Muscles Treated Back/Hip  Iliopsoas;Quadratus lumborum    Iliopsoas Response  Twitch response elicited;Palpable increased muscle length   R proximal iliacus   Quadratus Lumborum Response  Twitch response elicited;Palpable increased muscle length   Right            PT Short Term Goals - 09/04/19 0942      PT SHORT TERM GOAL #1   Title  Patient will be independent with initial HEP    Status  Achieved   08/16/19     PT SHORT TERM GOAL #2   Title  Patient to report pain  reduction in frequency and intensity by >/= 25%    Status  Achieved   08/23/19       PT Long Term Goals - 09/04/19 0942      PT LONG TERM GOAL #1   Title  Patient will be independent with ongoing/advanced HEP    Status  On-going   [name] good consistency; no questions on HEP     PT LONG TERM GOAL #2   Title  Patient to demonstrate appropriate posture and body mechanics needed for daily activities    Status  Partially Met   "not sure;" [name] less slouching and trying to use tips given to him     PT LONG TERM GOAL #3   Title  Patient to report pain reduction in frequency and intensity by >/= 50%    Status  Achieved   [name] 50% improvement     PT LONG TERM GOAL #4   Title  Patient to report ability to perform ADLs, household and work-related tasks without increased pain    Status  On-going   [name] that pain levels rise with activities that require getting down low like looking under the sink; also somewhat limited by B knee pain     PT LONG TERM GOAL #5   Title  Patient will report ability to complete his wood working projects with decreased low back pain interference    Status  On-going   has not attempted d/t fear of pain returning           Plan - 09/06/19 1017    Clinical Impression Statement  Thomas [name] good relief from prior DN for R flank/side pain and requesting this again today. Continue ttp with jump sign present in R proximal iliacus and obliques with mild ttp also noted in R QL. Addressed these areas with manual STM and TPR incorporating DN with reduction in muscle tension and jump sign noted following manual therapy along with improved tolerance for stretching. Encouraged patient to continue with stretches and HEP to further promote normalization of muscle tension and will plan to resume core/lumbopelvic strengthening next visit. Also discussed posture and body mechanics as it applies to resuming his woodworking projects, emphasizing frequent  changes of position as well as ensuring work station height allows for good upright posture avoiding excessive forward flexion.    Personal  Factors and Comorbidities  Age;Comorbidity 3+;Fitness;Time since onset of injury/illness/exacerbation    Comorbidities  HTN, CAD, CHF, DM-II, peripheral neuropathy, OA s/p R TKA, obesity, depression; further PMHx as above    Examination-Activity Limitations  Lift;Carry;Locomotion Level;Sit;Stand;Transfers;Squat;Stairs    Examination-Participation Restrictions  Community Activity;Yard Work    [employer]  Fair    PT Frequency  2x / week    PT Duration  8 weeks    PT Treatment/Interventions  ADLs/Self Care Home Management;Cryotherapy;Electrical Stimulation;Iontophoresis 53m/ml Dexamethasone;Moist Heat;Traction;Gait training;Functional mobility training;Therapeutic activities;Therapeutic exercise;Balance training;Neuromuscular re-education;Patient/family education;Manual techniques;Passive range of motion;Dry needling;Taping;Spinal Manipulations;Joint Manipulations    PT Next Visit Plan  lumbopelvic flexiblity and strengthening; manual therapy including DN as indicated; modalities PRN; review HEP as indicated; address any questions/concerns re: posture and body mechanics education    PT Home Exercise Plan  07/25/19 -  LTR, piriformis & HS stretches, seated prayer stretch; 08/02/19 - modified supine SKTC/KTOS piriformis stretch with towel, lumbopelvic stabilization/strengthening (pelvic tilt, brace march, bent-knee fall-out); 08/14/19 - standing abd bracing + rows/retraction with red TB, seated hip flexor/quad stretch    Consulted and Agree with Plan of Care  Patient       Patient will benefit from skilled therapeutic intervention in order to improve the following deficits and impairments:  Cardiopulmonary status limiting activity, Decreased activity tolerance, Decreased balance, Decreased endurance, Decreased knowledge of precautions, Decreased mobility, Decreased  range of motion, Decreased strength, Difficulty walking, Increased muscle spasms, Impaired flexibility, Impaired sensation  Visit Diagnosis: Chronic right-sided low back pain with right-sided sciatica  Muscle spasm of back  Muscle weakness (generalized)     Problem List Patient Active Problem List   Diagnosis Date Noted  . Coronary artery disease 02/11/2018  . Claudication in peripheral vascular disease ([place] 02/11/2018  . Atypical chest pain 01/12/2018  . Precordial chest pain 01/07/2018  . Coronary artery calcification 01/07/2018  . CKD (chronic kidney disease) stage 3, GFR 30-59 ml/min 11/25/2017  . Chronic diastolic heart failure ([place] 05/13/2017  . Aortic stenosis, mild 05/13/2017  . Low back pain 05/12/2017  . GERD (gastroesophageal reflux disease) 05/12/2017  . Prostate nodule 05/12/2017  . Obesity 05/12/2017  . Depression 05/12/2017  . Hx of colonic polyps 05/12/2017  . 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 Type 2 diabetes mellitus ([place] 05/12/2017  . Hyperlipidemia 05/12/2017  . Hypertensive heart disease with heart failure ([place] 05/12/2017  . Osteoarthritis of knee 05/12/2017  . Sleep apnea 05/12/2017  . Actinic keratosis 05/12/2017  . 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 Seborrheic dermatitis 05/12/2017  . Paresthesia 05/12/2017    JPercival Spanish PT, MPT 09/06/2019, 3:07 PM  [facility] [place] [place] [address]Phone: [phone]  Fax:  [phone] Name: BJOH RAOMRN: [id number] of Birth: [birth year]

## 2019-09-11 DIAGNOSIS — M5416 Radiculopathy, lumbar region: Secondary | ICD-10-CM | POA: Diagnosis not present

## 2019-09-11 DIAGNOSIS — M48061 Spinal stenosis, lumbar region without neurogenic claudication: Secondary | ICD-10-CM | POA: Diagnosis not present

## 2019-09-11 DIAGNOSIS — M47816 Spondylosis without myelopathy or radiculopathy, lumbar region: Secondary | ICD-10-CM | POA: Diagnosis not present

## 2019-09-12 ENCOUNTER — Encounter: Payer: Self-pay | Admitting: Physical Therapy

## 2019-09-12 ENCOUNTER — Other Ambulatory Visit: Payer: Self-pay

## 2019-09-12 ENCOUNTER — Ambulatory Visit: Payer: Medicare Other | Attending: Internal Medicine | Admitting: Physical Therapy

## 2019-09-12 DIAGNOSIS — I5032 Chronic diastolic (congestive) heart failure: Secondary | ICD-10-CM | POA: Diagnosis not present

## 2019-09-12 DIAGNOSIS — G8929 Other chronic pain: Secondary | ICD-10-CM | POA: Diagnosis not present

## 2019-09-12 DIAGNOSIS — I2510 Atherosclerotic heart disease of native coronary artery without angina pectoris: Secondary | ICD-10-CM | POA: Insufficient documentation

## 2019-09-12 DIAGNOSIS — M62830 Muscle spasm of back: Secondary | ICD-10-CM | POA: Diagnosis not present

## 2019-09-12 DIAGNOSIS — E669 Obesity, unspecified: Secondary | ICD-10-CM | POA: Diagnosis not present

## 2019-09-12 DIAGNOSIS — E1122 Type 2 diabetes mellitus with diabetic chronic kidney disease: Secondary | ICD-10-CM | POA: Diagnosis not present

## 2019-09-12 DIAGNOSIS — Z96651 Presence of right artificial knee joint: Secondary | ICD-10-CM | POA: Insufficient documentation

## 2019-09-12 DIAGNOSIS — I350 Nonrheumatic aortic (valve) stenosis: Secondary | ICD-10-CM | POA: Diagnosis not present

## 2019-09-12 DIAGNOSIS — M5441 Lumbago with sciatica, right side: Secondary | ICD-10-CM | POA: Insufficient documentation

## 2019-09-12 DIAGNOSIS — N183 Chronic kidney disease, stage 3 unspecified: Secondary | ICD-10-CM | POA: Diagnosis not present

## 2019-09-12 DIAGNOSIS — E1151 Type 2 diabetes mellitus with diabetic peripheral angiopathy without gangrene: Secondary | ICD-10-CM | POA: Diagnosis not present

## 2019-09-12 DIAGNOSIS — M6281 Muscle weakness (generalized): Secondary | ICD-10-CM | POA: Insufficient documentation

## 2019-09-12 DIAGNOSIS — I110 Hypertensive heart disease with heart failure: Secondary | ICD-10-CM | POA: Insufficient documentation

## 2019-09-12 NOTE — Therapy (Signed)
[facility] [address]  [place], [place], [address] Phone: [phone]   Fax:  [phone]  Physical Therapy Treatment  Patient Details  Name: Thomas [name] MRN: [id number] Date of Birth: 04-06-1946 Referring Provider (PT): Janett Billow, MD   Encounter Date: 09/12/2019  PT End of Session - 09/12/19 1014    Visit Number  12    Number of Visits  15    Date for PT Re-Evaluation  09/19/19    Authorization Type  Medicare & VA (15 visits)    Authorization - Visit Number  12    Authorization - Number of Visits  15    PT Start Time  [phone]    PT Stop Time  1012    PT Time Calculation (min)  46 min    Activity Tolerance  Patient tolerated treatment well    Behavior During Therapy  [facility] for tasks assessed/performed       Past Medical History:  Diagnosis Date  . Actinic keratosis 05/12/2017  . Back pain   . Depression 05/12/2017  . Diabetes ([place])   . GERD (gastroesophageal reflux disease) 05/12/2017  . Hx of colonic polyps 05/12/2017  . Hyperlipidemia 05/12/2017  . Hypertension   . 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 Low back pain 05/12/2017  . Lumbar radiculopathy 05/12/2017  . 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 Obesity 05/12/2017  . Osteoarthritis of knee 05/12/2017  . Paresthesia 05/12/2017  . 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 Prostate nodule 05/12/2017  . Seborrheic dermatitis 05/12/2017    Past Surgical History:  Procedure Laterality Date  . HAND SURGERY    . REPLACEMENT TOTAL KNEE Right   . RIGHT/LEFT HEART CATH AND CORONARY ANGIOGRAPHY N/A 01/12/2018   Procedure: RIGHT/LEFT HEART CATH AND CORONARY ANGIOGRAPHY;  Surgeon: Belva Crome, MD;  Location: [facility];  Service: Cardiovascular;  Laterality: N/A;  . SHOULDER SURGERY Right     There were no vitals filed for this visit.  Subjective Assessment - 09/12/19 0927    Subjective  Hoping to get DN today since it is really helping him through his R side. Has an  injection scheduled tomorrow.    Pertinent History  R TKA    Patient Stated Goals  "to get where I can function"    Currently in Pain?  Yes    Pain Score  4     Pain Location  Flank    Pain Orientation  Right    Pain Descriptors / Indicators  Stabbing    Pain Type  Chronic pain                       [facility] Adult PT Treatment/Exercise - 09/12/19 0001      Lumbar Exercises: Stretches   Other Lumbar Stretch Exercise  R/L QL stretch in doorway 2x20" each side   cues for proper positioning     Lumbar Exercises: Aerobic   Nustep  L4 x 6 min (LEs)      Lumbar Exercises: Standing   Shoulder Adduction Limitations  R/L paloff press with green TB x10 each side   good form   Other Standing Lumbar Exercises  B hip abduction and extension with yellow TB around ankles x10 each LE   manual cues to maintain upright trunk   Other Standing Lumbar Exercises  resisted trunk rotation with green TB x10 each direction   manual cues to maintain upright trunk  Lumbar Exercises: Seated   Other Seated Lumbar Exercises  sitting ab set 10x10"   cues for normal breathing pattern     Manual Therapy   Manual Therapy  Soft tissue mobilization;Myofascial release;Passive ROM    Manual therapy comments  supine & L sidelying    Soft tissue mobilization  STM/DTM to R QL and obliques- jump sign and soft tissue restriction evident   no tenderness over iliacus   Myofascial Release  manual TPR to R QL and obliques- tenderness and frequent jump sign               PT Short Term Goals - 09/04/19 0942      PT SHORT TERM GOAL #1   Title  Patient will be independent with initial HEP    Status  Achieved   08/16/19     PT SHORT TERM GOAL #2   Title  Patient to report pain reduction in frequency and intensity by >/= 25%    Status  Achieved   08/23/19       PT Long Term Goals - 09/04/19 0942      PT LONG TERM GOAL #1   Title  Patient will be independent with ongoing/advanced HEP     Status  On-going   reporting good consistency; no questions on HEP     PT LONG TERM GOAL #2   Title  Patient to demonstrate appropriate posture and body mechanics needed for daily activities    Status  Partially Met   "not sure;" reporting less slouching and trying to use tips given to him     PT LONG TERM GOAL #3   Title  Patient to report pain reduction in frequency and intensity by >/= 50%    Status  Achieved   reporting 50% improvement     PT LONG TERM GOAL #4   Title  Patient to report ability to perform ADLs, household and work-related tasks without increased pain    Status  On-going   reporting that pain levels rise with activities that require getting down low like looking under the sink; also somewhat limited by B knee pain     PT LONG TERM GOAL #5   Title  Patient will report ability to complete his wood working projects with decreased low back pain interference    Status  On-going   has not attempted d/t fear of pain returning           Plan - 09/12/19 1014    Clinical Impression Statement  Patient reporting that he is scheduled for a spinal injection tomorrow. Reporting continued relief from DN last session. Reviewed QL stretch for max benefit with minor corrective cues required. Patient performed resisted hip strengthening with manual cues to maintain upright posture as patient with tendency for anterior and lateral trunk lean. Able to tolerate resisted core strengthening with good effort. Ended session with STM and TPR to R flank d/t patient's continued report of pain. Reported no tenderness in iliacus today, but with remaining tenderness and soft tissue restriction in obliques and QL. Patient reported relief of pain at end of session. Patient progressing well towards goals.    Personal Factors and Comorbidities  Age;Comorbidity 3+;Fitness;Time since onset of injury/illness/exacerbation    Comorbidities  HTN, CAD, CHF, DM-II, peripheral neuropathy, OA s/p R TKA,  obesity, depression; further PMHx as above    Examination-Activity Limitations  Lift;Carry;Locomotion Level;Sit;Stand;Transfers;Squat;Stairs    Examination-Participation Restrictions  Community Activity;Yard Work    Water engineer  Fair    PT Frequency  2x / week    PT Duration  8 weeks    PT Treatment/Interventions  ADLs/Self Care Home Management;Cryotherapy;Electrical Stimulation;Iontophoresis 66m/ml Dexamethasone;Moist Heat;Traction;Gait training;Functional mobility training;Therapeutic activities;Therapeutic exercise;Balance training;Neuromuscular re-education;Patient/family education;Manual techniques;Passive range of motion;Dry needling;Taping;Spinal Manipulations;Joint Manipulations    PT Next Visit Plan  recert or DC on 097/02/63 lumbopelvic flexiblity and strengthening; manual therapy including DN as indicated; modalities PRN; review HEP as indicated; address any questions/concerns re: posture and body mechanics education    PT Home Exercise Plan  07/25/19 -  LTR, piriformis & HS stretches, seated prayer stretch; 08/02/19 - modified supine SKTC/KTOS piriformis stretch with towel, lumbopelvic stabilization/strengthening (pelvic tilt, brace march, bent-knee fall-out); 08/14/19 - standing abd bracing + rows/retraction with red TB, seated hip flexor/quad stretch    Consulted and Agree with Plan of Care  Patient       Patient will benefit from skilled therapeutic intervention in order to improve the following deficits and impairments:  Cardiopulmonary status limiting activity, Decreased activity tolerance, Decreased balance, Decreased endurance, Decreased knowledge of precautions, Decreased mobility, Decreased range of motion, Decreased strength, Difficulty walking, Increased muscle spasms, Impaired flexibility, Impaired sensation  Visit Diagnosis: Chronic right-sided low back pain with right-sided sciatica  Muscle spasm of back  Muscle weakness (generalized)     Problem List Patient  Active Problem List   Diagnosis Date Noted  . Coronary artery disease 02/11/2018  . Claudication in peripheral vascular disease ([place] 02/11/2018  . Atypical chest pain 01/12/2018  . Precordial chest pain 01/07/2018  . Coronary artery calcification 01/07/2018  . CKD (chronic kidney disease) stage 3, GFR 30-59 ml/min 11/25/2017  . Chronic diastolic heart failure ([place] 05/13/2017  . Aortic stenosis, mild 05/13/2017  . Low back pain 05/12/2017  . GERD (gastroesophageal reflux disease) 05/12/2017  . Prostate nodule 05/12/2017  . Obesity 05/12/2017  . Depression 05/12/2017  . Hx of colonic polyps 05/12/2017  . 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 Type 2 diabetes mellitus ([place] 05/12/2017  . Hyperlipidemia 05/12/2017  . Hypertensive heart disease with heart failure ([place] 05/12/2017  . Osteoarthritis of knee 05/12/2017  . Sleep apnea 05/12/2017  . Actinic keratosis 05/12/2017  . 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 Seborrheic dermatitis 05/12/2017  . Paresthesia 05/12/2017     YJanene Harvey PT, DPT 09/12/19 10:20 AM   [facility] [place] [address]Phone: [phone]  Fax:  [phone] Name: Thomas MICHELMRN: [id number] of Birth: [birth year]

## 2019-09-13 DIAGNOSIS — M48061 Spinal stenosis, lumbar region without neurogenic claudication: Secondary | ICD-10-CM | POA: Diagnosis not present

## 2019-09-14 ENCOUNTER — Encounter: Payer: Self-pay | Admitting: Physical Therapy

## 2019-09-19 ENCOUNTER — Encounter: Payer: Self-pay | Admitting: Physical Therapy

## 2019-09-19 ENCOUNTER — Ambulatory Visit: Payer: Medicare Other | Admitting: Physical Therapy

## 2019-09-19 ENCOUNTER — Other Ambulatory Visit: Payer: Self-pay

## 2019-09-19 DIAGNOSIS — M5441 Lumbago with sciatica, right side: Secondary | ICD-10-CM | POA: Diagnosis not present

## 2019-09-19 DIAGNOSIS — M62830 Muscle spasm of back: Secondary | ICD-10-CM

## 2019-09-19 DIAGNOSIS — E669 Obesity, unspecified: Secondary | ICD-10-CM | POA: Diagnosis not present

## 2019-09-19 DIAGNOSIS — M6281 Muscle weakness (generalized): Secondary | ICD-10-CM | POA: Diagnosis not present

## 2019-09-19 DIAGNOSIS — E1151 Type 2 diabetes mellitus with diabetic peripheral angiopathy without gangrene: Secondary | ICD-10-CM | POA: Diagnosis not present

## 2019-09-19 DIAGNOSIS — G8929 Other chronic pain: Secondary | ICD-10-CM | POA: Diagnosis not present

## 2019-09-19 NOTE — Patient Instructions (Signed)
Home exercise program created by Annie Paras, PT.  For questions, please contact Dreanna Kyllo via phone at [phone] or email at [name]  [facility] [address]  [place], [place], [address] Phone: [phone]   Fax:  [phone]

## 2019-09-19 NOTE — Therapy (Signed)
[facility] [address]  [place], [place], [address] Phone: [phone]   Fax:  [phone]  Physical Therapy Treatment  Patient Details  Name: Thomas [name] MRN: [id number] Date of Birth: 04-19-1946 Referring Provider (PT): Janett Billow, MD   Encounter Date: 09/19/2019  PT End of Session - 09/19/19 0939    Visit Number  13    Number of Visits  15    Date for PT Re-Evaluation  10/03/19    Authorization Type  Medicare & VA (15 visits)    Authorization - Visit Number  13    Authorization - Number of Visits  15    PT Start Time  [phone]    PT Stop Time  1023    PT Time Calculation (min)  44 min    Activity Tolerance  Patient tolerated treatment well    Behavior During Therapy  [facility] for tasks assessed/performed       Past Medical History:  Diagnosis Date  . Actinic keratosis 05/12/2017  . Back pain   . Depression 05/12/2017  . Diabetes ([place])   . GERD (gastroesophageal reflux disease) 05/12/2017  . Hx of colonic polyps 05/12/2017  . Hyperlipidemia 05/12/2017  . Hypertension   . 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 Low back pain 05/12/2017  . Lumbar radiculopathy 05/12/2017  . 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 Obesity 05/12/2017  . Osteoarthritis of knee 05/12/2017  . Paresthesia 05/12/2017  . 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 Prostate nodule 05/12/2017  . Seborrheic dermatitis 05/12/2017    Past Surgical History:  Procedure Laterality Date  . HAND SURGERY    . REPLACEMENT TOTAL KNEE Right   . RIGHT/LEFT HEART CATH AND CORONARY ANGIOGRAPHY N/A 01/12/2018   Procedure: RIGHT/LEFT HEART CATH AND CORONARY ANGIOGRAPHY;  Surgeon: Belva Crome, MD;  Location: [facility];  Service: Cardiovascular;  Laterality: N/A;  . SHOULDER SURGERY Right     There were no vitals filed for this visit.  Subjective Assessment - 09/19/19 0942    Subjective  Pt reporting he got an injection last week but then fell the next day in his office  (not sure what happed but thinks his knee buckled) hitting his R side on a chair - not sure if the injection helped or if he jarred something when he fell, but states pain has been better since. Has been using his cane since the fall. Pt requesting to defer DN today due to pending COVID-19 vaccine even after education that DN would not impact ability to receive vaccine.    Pertinent History  R TKA    Patient Stated Goals  "to get where I can function"    Currently in Pain?  Yes    Pain Score  4    3-4/10   Pain Location  Flank    Pain Orientation  Right    Pain Descriptors / Indicators  Stabbing   "less harsh"   Pain Type  Chronic pain    Pain Frequency  Intermittent         [facility] PT Assessment - 09/19/19 0939      Assessment   Medical Diagnosis  Chronic low back pain    Referring Provider (PT)  Janett Billow, MD    Onset Date/Surgical Date  --   chronic   Next MD Visit  Jan 2021      Prior Function   Level of Independence  Independent    Vocation  Retired  Leisure  wood working      Observation/Other Assessments   Focus on Therapeutic Outcomes (FOTO)   Lumbar - 40% (60% limitation); Predicted 50% (50% limitation)   as of 09/04/19                  [facility] Adult PT Treatment/Exercise - 09/19/19 0939      Exercises   Exercises  Lumbar      Lumbar Exercises: Aerobic   Nustep  L4 x 6 min (LEs)      Lumbar Exercises: Standing   Other Standing Lumbar Exercises  R/L pallof press with green TB x10 each side    Other Standing Lumbar Exercises  resisted trunk rotation with green TB x10 each direction   cues to limit rotation to ~shoulder width     Lumbar Exercises: Supine   Glut Set  15 reps;3 seconds    Clam Limitations  abd bracing + alt hip ABD/ER with green TB - cues to avoid trunk rotation    Bent Knee Raise  15 reps;3 seconds    Bent Knee Raise Limitations  brace marching with red TB at knees    Dead Bug  10 reps;3 seconds    Dead Bug Limitations  UE/LE  from hooklying    Bridge  10 reps   slightly limited ROM   Bridge Limitations  + hip ABD isometric into green TB - cues for rhythmic breathing and proper set up      Lumbar Exercises: Sidelying   Other Sidelying Lumbar Exercises  Open book stretch 10 x 5"      Manual Therapy   Manual Therapy  Soft tissue mobilization;Myofascial release    Soft tissue mobilization  STM/DTM to R QL and obliques - no jump sign but mild ttp and soft tissue restriction evident    Myofascial Release  manual TPR to R QL and obliques             PT Education - 09/19/19 1020    Education Details  HEP update - progression of resistance with hooklying clam, march & bridge to green TB, addition of UE/LE dead bug, pallof press & resisted trunk rotation    Person(s) Educated  Patient    Methods  Explanation;Demonstration;Verbal cues;Handout    Comprehension  Verbalized understanding;Returned demonstration;Verbal cues required;Need further instruction       PT Short Term Goals - 09/04/19 0942      PT SHORT TERM GOAL #1   Title  Patient will be independent with initial HEP    Status  Achieved   08/16/19     PT SHORT TERM GOAL #2   Title  Patient to report pain reduction in frequency and intensity by >/= 25%    Status  Achieved   08/23/19       PT Long Term Goals - 09/19/19 1023      PT LONG TERM GOAL #1   Title  Patient will be independent with ongoing/advanced HEP    Status  Partially Met   09/18/18 - reporting good consistency with current HEP with no questions - update provided today   Target Date  10/06/19      PT LONG TERM GOAL #2   Title  Patient to demonstrate appropriate posture and body mechanics needed for daily activities    Status  Partially Met   09/04/19 - "not sure;" reporting less slouching and trying to use tips given to him   Target Date  10/06/19  PT LONG TERM GOAL #3   Title  Patient to report pain reduction in frequency and intensity by >/= 50%    Status  Achieved    09/04/19     PT LONG TERM GOAL #4   Title  Patient to report ability to perform ADLs, household and work-related tasks without increased back pain    Status  On-going   09/04/19 - reporting that pain levels rise with activities that require getting down low like looking under the sink; also somewhat limited by B knee pain   Target Date  10/06/19      PT LONG TERM GOAL #5   Title  Patient will report ability to complete his wood working projects with decreased low back pain interference    Status  On-going   09/19/19 - has not attempted d/t fear of pain returning   Target Date  10/06/19            Plan - 09/19/19 1023    Clinical Impression Statement  Thomas [name] reporting his pain is less "harsh" today following ESI on 09/13/19 but also notes benefit from recent manual STM including DN. He reports a fall at home the day following the [facility] which he thinks may have been due to his R knee giving way but reports no other weakness or instability since, although he has been using his cane more for safety. Pain reports improved by >50% since start of PT with current pain rating only 3-4/10 vs 9-10/10 on eval. He reports no issues with HEP and was able to tolerate progression of resistance with supine exercises well today. HEP also updated to include dead bug and standing trunk isometric and resisted rotation. Patient feels that therapy has been helping and would to continue for remaining 2 visits approved by [place] (2 missed visits), therefore will recert to extend POC for 2 more weeks with frequency reduced to 1x/wk as patient prepares for transition to HEP.    Personal Factors and Comorbidities  Age;Comorbidity 3+;Fitness;Time since onset of injury/illness/exacerbation    Comorbidities  HTN, CAD, CHF, DM-II, peripheral neuropathy, OA s/p R TKA, obesity, depression; further PMHx as above    Examination-Activity Limitations  Lift;Carry;Locomotion Level;Sit;Stand;Transfers;Squat;Stairs     Examination-Participation Restrictions  Community Activity;Yard Work    [employer] Potential  Fair    PT Frequency  1x / week    PT Duration  2 weeks    PT Treatment/Interventions  ADLs/Self Care Home Management;Cryotherapy;Electrical Stimulation;Iontophoresis 75m/ml Dexamethasone;Moist Heat;Traction;Gait training;Functional mobility training;Therapeutic activities;Therapeutic exercise;Balance training;Neuromuscular re-education;Patient/family education;Manual techniques;Passive range of motion;Dry needling;Taping;Spinal Manipulations;Joint Manipulations    PT Next Visit Plan  lumbopelvic flexiblity and strengthening; manual therapy including DN as indicated; modalities PRN; review HEP as indicated; address any questions/concerns re: posture and body mechanics education    PT Home Exercise Plan  07/25/19 -  LTR, piriformis & HS stretches, seated prayer stretch; 08/02/19 - modified supine SKTC/KTOS piriformis stretch with towel, lumbopelvic stabilization/strengthening (pelvic tilt, brace march, bent-knee fall-out); 08/14/19 - standing abd bracing + rows/retraction with red TB, seated hip flexor/quad stretch; 09/19/19 - progression of resistance with hooklying clam, march & bridge to green TB, addition of UE/LE dead bug, pallof press & resisted trunk rotation    Consulted and Agree with Plan of Care  Patient       Patient will benefit from skilled therapeutic intervention in order to improve the following deficits and impairments:  Cardiopulmonary status limiting activity, Decreased activity tolerance, Decreased balance, Decreased endurance, Decreased knowledge of precautions, Decreased mobility,  Decreased range of motion, Decreased strength, Difficulty walking, Increased muscle spasms, Impaired flexibility, Impaired sensation  Visit Diagnosis: Chronic right-sided low back pain with right-sided sciatica  Muscle spasm of back  Muscle weakness (generalized)     Problem List Patient Active Problem List    Diagnosis Date Noted  . Coronary artery disease 02/11/2018  . Claudication in peripheral vascular disease ([place]) 02/11/2018  . Atypical chest pain 01/12/2018  . Precordial chest pain 01/07/2018  . Coronary artery calcification 01/07/2018  . CKD (chronic kidney disease) stage 3, GFR 30-59 ml/min 11/25/2017  . Chronic diastolic heart failure ([place]) 05/13/2017  . Aortic stenosis, mild 05/13/2017  . Low back pain 05/12/2017  . GERD (gastroesophageal reflux disease) 05/12/2017  . Prostate nodule 05/12/2017  . Obesity 05/12/2017  . Depression 05/12/2017  . Hx of colonic polyps 05/12/2017  . 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 Type 2 diabetes mellitus ([place]) 05/12/2017  . Hyperlipidemia 05/12/2017  . Hypertensive heart disease with heart failure ([place]) 05/12/2017  . Osteoarthritis of knee 05/12/2017  . Sleep apnea 05/12/2017  . Actinic keratosis 05/12/2017  . 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 Seborrheic dermatitis 05/12/2017  . Paresthesia 05/12/2017    Percival Spanish, PT, MPT 09/19/2019, 5:16 PM  [facility] [address]  [place], [place], [address] Phone: [phone]   Fax:  [phone]  Name: LAUREN AGUAYO MRN: [id number] Date of Birth: [birth year]

## 2019-09-25 DIAGNOSIS — M79671 Pain in right foot: Secondary | ICD-10-CM | POA: Diagnosis not present

## 2019-09-25 DIAGNOSIS — E119 Type 2 diabetes mellitus without complications: Secondary | ICD-10-CM | POA: Diagnosis not present

## 2019-09-25 DIAGNOSIS — B351 Tinea unguium: Secondary | ICD-10-CM | POA: Diagnosis not present

## 2019-09-25 DIAGNOSIS — M79672 Pain in left foot: Secondary | ICD-10-CM | POA: Diagnosis not present

## 2019-09-26 ENCOUNTER — Ambulatory Visit: Payer: Medicare Other | Admitting: Physical Therapy

## 2019-09-26 ENCOUNTER — Encounter: Payer: Self-pay | Admitting: Physical Therapy

## 2019-09-26 ENCOUNTER — Other Ambulatory Visit: Payer: Self-pay

## 2019-09-26 DIAGNOSIS — M6281 Muscle weakness (generalized): Secondary | ICD-10-CM

## 2019-09-26 DIAGNOSIS — G8929 Other chronic pain: Secondary | ICD-10-CM | POA: Diagnosis not present

## 2019-09-26 DIAGNOSIS — M62830 Muscle spasm of back: Secondary | ICD-10-CM | POA: Diagnosis not present

## 2019-09-26 DIAGNOSIS — M5441 Lumbago with sciatica, right side: Secondary | ICD-10-CM | POA: Diagnosis not present

## 2019-09-26 DIAGNOSIS — E1151 Type 2 diabetes mellitus with diabetic peripheral angiopathy without gangrene: Secondary | ICD-10-CM | POA: Diagnosis not present

## 2019-09-26 DIAGNOSIS — E669 Obesity, unspecified: Secondary | ICD-10-CM | POA: Diagnosis not present

## 2019-09-26 NOTE — Therapy (Signed)
[facility] [address]  [place], [place], [address] Phone: [phone]   Fax:  [phone]  Physical Therapy Treatment  Patient Details  Name: Thomas [name] MRN: [id number] Date of Birth: 1945/12/20 Referring Provider (PT): Janett Billow, MD   Encounter Date: 09/26/2019  PT End of Session - 09/26/19 0929    Visit Number  14    Number of Visits  15    Date for PT Re-Evaluation  10/03/19   10/06/19?   Authorization Type  Medicare & VA (15 visits)    Authorization - Visit Number  14    Authorization - Number of Visits  15    PT Start Time  8676    PT Stop Time  0928    PT Time Calculation (min)  41 min    Activity Tolerance  Patient tolerated treatment well    Behavior During Therapy  [facility] for tasks assessed/performed       Past Medical History:  Diagnosis Date  . Actinic keratosis 05/12/2017  . Back pain   . Depression 05/12/2017  . Diabetes ([place])   . GERD (gastroesophageal reflux disease) 05/12/2017  . Hx of colonic polyps 05/12/2017  . Hyperlipidemia 05/12/2017  . Hypertension   . 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 Low back pain 05/12/2017  . Lumbar radiculopathy 05/12/2017  . 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 Obesity 05/12/2017  . Osteoarthritis of knee 05/12/2017  . Paresthesia 05/12/2017  . 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 Prostate nodule 05/12/2017  . Seborrheic dermatitis 05/12/2017    Past Surgical History:  Procedure Laterality Date  . HAND SURGERY    . REPLACEMENT TOTAL KNEE Right   . RIGHT/LEFT HEART CATH AND CORONARY ANGIOGRAPHY N/A 01/12/2018   Procedure: RIGHT/LEFT HEART CATH AND CORONARY ANGIOGRAPHY;  Surgeon: Belva Crome, MD;  Location: [facility];  Service: Cardiovascular;  Laterality: N/A;  . SHOULDER SURGERY Right     There were no vitals filed for this visit.  Subjective Assessment - 09/26/19 0848    Subjective  Reporting relief from manual therapy a couple of sessions ago. Fell  yesterday when picking up an orange off the floor- did not hurt himself. Was able to complete an wooden eagle the other day.    Pertinent History  R TKA    Patient Stated Goals  "to get where I can function"    Currently in Pain?  Yes    Pain Score  4     Pain Location  Flank    Pain Orientation  Right    Pain Descriptors / Indicators  Stabbing    Pain Type  Chronic pain    Pain Score  6    Pain Location  Knee    Pain Orientation  Right;Left    Pain Descriptors / Indicators  Sharp    Pain Type  Chronic pain                       [facility] Adult PT Treatment/Exercise - 09/26/19 0001      Lumbar Exercises: Aerobic   Nustep  L4 x 6 min (LEs)      Lumbar Exercises: Standing   Wall Slides  10 reps    Wall Slides Limitations  cues to shift L and for foot placement      Lumbar Exercises: Supine   Clam  15 reps    Clam Limitations  with green loop   cues  to contract core   Dead Bug  10 reps;3 seconds    Dead Bug Limitations  2x10; UE/LE from hooklying with orange pball on belly    Bridge  10 reps    Bridge Limitations  + hip ABD isometric into green TB    cues for core contraction   Other Supine Lumbar Exercises  LTR x20 to tolerance       Manual Therapy   Manual Therapy  Soft tissue mobilization;Myofascial release    Manual therapy comments  supine & L sidelying    Soft tissue mobilization  STM to R QL and obliques- tenderness, jump sign, and trigger point evident    Myofascial Release  manual TPR to R QL and obliques    Passive ROM  R & L passive figure 4 and KTOS stretch to tolerance 30" each               PT Short Term Goals - 09/04/19 0942      PT SHORT TERM GOAL #1   Title  Patient will be independent with initial HEP    Status  Achieved   08/16/19     PT SHORT TERM GOAL #2   Title  Patient to report pain reduction in frequency and intensity by >/= 25%    Status  Achieved   08/23/19       PT Long Term Goals - 09/19/19 1023      PT LONG  TERM GOAL #1   Title  Patient will be independent with ongoing/advanced HEP    Status  Partially Met   09/18/18 - reporting good consistency with current HEP with no questions - update provided today   Target Date  10/06/19      PT LONG TERM GOAL #2   Title  Patient to demonstrate appropriate posture and body mechanics needed for daily activities    Status  Partially Met   09/04/19 - "not sure;" reporting less slouching and trying to use tips given to him   Target Date  10/06/19      PT LONG TERM GOAL #3   Title  Patient to report pain reduction in frequency and intensity by >/= 50%    Status  Achieved   09/04/19     PT LONG TERM GOAL #4   Title  Patient to report ability to perform ADLs, household and work-related tasks without increased back pain    Status  On-going   09/04/19 - reporting that pain levels rise with activities that require getting down low like looking under the sink; also somewhat limited by B knee pain   Target Date  10/06/19      PT LONG TERM GOAL #5   Title  Patient will report ability to complete his wood working projects with decreased low back pain interference    Status  On-going   09/19/19 - has not attempted d/t fear of pain returning   Target Date  10/06/19            Plan - 09/26/19 0930    Clinical Impression Statement  Patient reporting improvement in R flank pain since manual therapy a couple sessions ago. Also notes possible improvement from spinal injection recently. Does note a fall yesterday when trying to pick up an orange from the floor, but denies injury. Worked on lumbopelvic stretching and hip stretching to patient's tolerance. Able to perform core and glute strengthening with good effort and without c/o pain. Ended session with manual therapy for  improvement in tenderness, soft tissue restriction, trigger points in R QL and obliques. Patient reported relief at end of session. Plan for possible D/C next session.    Personal Factors and  Comorbidities  Age;Comorbidity 3+;Fitness;Time since onset of injury/illness/exacerbation    Comorbidities  HTN, CAD, CHF, DM-II, peripheral neuropathy, OA s/p R TKA, obesity, depression; further PMHx as above    Examination-Activity Limitations  Lift;Carry;Locomotion Level;Sit;Stand;Transfers;Squat;Stairs    Examination-Participation Restrictions  Community Activity;Yard Work    [employer] Potential  Fair    PT Frequency  1x / week    PT Duration  2 weeks    PT Treatment/Interventions  ADLs/Self Care Home Management;Cryotherapy;Electrical Stimulation;Iontophoresis 67m/ml Dexamethasone;Moist Heat;Traction;Gait training;Functional mobility training;Therapeutic activities;Therapeutic exercise;Balance training;Neuromuscular re-education;Patient/family education;Manual techniques;Passive range of motion;Dry needling;Taping;Spinal Manipulations;Joint Manipulations    PT Next Visit Plan  possible D/C next session; lumbopelvic flexiblity and strengthening; manual therapy including DN as indicated; modalities PRN; review HEP as indicated; address any questions/concerns re: posture and body mechanics education    PT Home Exercise Plan  07/25/19 -  LTR, piriformis & HS stretches, seated prayer stretch; 08/02/19 - modified supine SKTC/KTOS piriformis stretch with towel, lumbopelvic stabilization/strengthening (pelvic tilt, brace march, bent-knee fall-out); 08/14/19 - standing abd bracing + rows/retraction with red TB, seated hip flexor/quad stretch; 09/19/19 - progression of resistance with hooklying clam, march & bridge to green TB, addition of UE/LE dead bug, pallof press & resisted trunk rotation    Consulted and Agree with Plan of Care  Patient       Patient will benefit from skilled therapeutic intervention in order to improve the following deficits and impairments:  Cardiopulmonary status limiting activity, Decreased activity tolerance, Decreased balance, Decreased endurance, Decreased knowledge of precautions,  Decreased mobility, Decreased range of motion, Decreased strength, Difficulty walking, Increased muscle spasms, Impaired flexibility, Impaired sensation  Visit Diagnosis: Chronic right-sided low back pain with right-sided sciatica  Muscle spasm of back  Muscle weakness (generalized)     Problem List Patient Active Problem List   Diagnosis Date Noted  . Coronary artery disease 02/11/2018  . Claudication in peripheral vascular disease ([place] 02/11/2018  . Atypical chest pain 01/12/2018  . Precordial chest pain 01/07/2018  . Coronary artery calcification 01/07/2018  . CKD (chronic kidney disease) stage 3, GFR 30-59 ml/min 11/25/2017  . Chronic diastolic heart failure ([place] 05/13/2017  . Aortic stenosis, mild 05/13/2017  . Low back pain 05/12/2017  . GERD (gastroesophageal reflux disease) 05/12/2017  . Prostate nodule 05/12/2017  . Obesity 05/12/2017  . Depression 05/12/2017  . Hx of colonic polyps 05/12/2017  . 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 Type 2 diabetes mellitus ([place] 05/12/2017  . Hyperlipidemia 05/12/2017  . Hypertensive heart disease with heart failure ([place] 05/12/2017  . Osteoarthritis of knee 05/12/2017  . Sleep apnea 05/12/2017  . Actinic keratosis 05/12/2017  . 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 Seborrheic dermatitis 05/12/2017  . Paresthesia 05/12/2017     YJanene Harvey PT, DPT 09/26/19 9:34 AM   [facility] [place] [address]Phone: [phone]  Fax:  [phone] Name: Thomas CAWLEYMRN: [id number] of Birth: 1Aug 24, 1947

## 2019-10-04 ENCOUNTER — Other Ambulatory Visit: Payer: Self-pay

## 2019-10-04 ENCOUNTER — Ambulatory Visit: Payer: Medicare Other | Admitting: Physical Therapy

## 2019-10-04 ENCOUNTER — Encounter: Payer: Self-pay | Admitting: Physical Therapy

## 2019-10-04 DIAGNOSIS — G8929 Other chronic pain: Secondary | ICD-10-CM | POA: Diagnosis not present

## 2019-10-04 DIAGNOSIS — M6281 Muscle weakness (generalized): Secondary | ICD-10-CM

## 2019-10-04 DIAGNOSIS — E1151 Type 2 diabetes mellitus with diabetic peripheral angiopathy without gangrene: Secondary | ICD-10-CM | POA: Diagnosis not present

## 2019-10-04 DIAGNOSIS — M62830 Muscle spasm of back: Secondary | ICD-10-CM

## 2019-10-04 DIAGNOSIS — E669 Obesity, unspecified: Secondary | ICD-10-CM | POA: Diagnosis not present

## 2019-10-04 DIAGNOSIS — M5441 Lumbago with sciatica, right side: Secondary | ICD-10-CM | POA: Diagnosis not present

## 2019-10-04 NOTE — Patient Instructions (Signed)
Home exercise program created by Annie Paras, PT.  For questions, please contact Tabria Steines via phone at [phone] or email at [email or web address]  [facility] [address]  [place], [place], [address] Phone: [phone]   Fax:  [phone]

## 2019-10-04 NOTE — Therapy (Signed)
[facility] [address]  [employer] [place], [place], [address] Phone: [phone]   Fax:  [phone]  Physical Therapy Treatment / Discharge Summary  Patient Details  Name: Thomas [name] MRN: [id number] Date of Birth: [birth year] Referring Provider (PT): Janett Billow, MD   Encounter Date: 10/04/2019  PT End of Session - 10/04/19 0801    Visit Number  15    Number of Visits  15    Authorization Type  Medicare & VA (15 visits)    Authorization - Visit Number  15    Authorization - Number of Visits  15    PT Start Time  0801    PT Stop Time  0845    PT Time Calculation (min)  44 min    Activity Tolerance  Patient tolerated treatment well    Behavior During Therapy  [facility] for tasks assessed/performed       Past Medical History:  Diagnosis Date  . Actinic keratosis 05/12/2017  . Back pain   . Depression 05/12/2017  . Diabetes ([place])   . GERD (gastroesophageal reflux disease) 05/12/2017  . Hx of colonic polyps 05/12/2017  . Hyperlipidemia 05/12/2017  . Hypertension   . 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 Low back pain 05/12/2017  . Lumbar radiculopathy 05/12/2017  . 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 Obesity 05/12/2017  . Osteoarthritis of knee 05/12/2017  . Paresthesia 05/12/2017  . 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 Prostate nodule 05/12/2017  . Seborrheic dermatitis 05/12/2017    Past Surgical History:  Procedure Laterality Date  . HAND SURGERY    . REPLACEMENT TOTAL KNEE Right   . RIGHT/LEFT HEART CATH AND CORONARY ANGIOGRAPHY N/A 01/12/2018   Procedure: RIGHT/LEFT HEART CATH AND CORONARY ANGIOGRAPHY;  Surgeon: Belva Crome, MD;  Location: [facility];  Service: Cardiovascular;  Laterality: N/A;  . SHOULDER SURGERY Right     There were no vitals filed for this visit.  Subjective Assessment - 10/04/19 0805    Subjective  Pt reporting only very mild intermittent R flank and low back pain at this point, no greater than 2/10.  Has been able to resume his woodworking.    Pertinent History  R TKA    How long can you sit comfortably?  1 hr    How long can you stand comfortably?  1 hr    How long can you walk comfortably?  1.5 hr    Patient Stated Goals  "to get where I can function"    Currently in Pain?  Yes    Pain Score  2     Pain Location  Flank    Pain Orientation  Right    Pain Descriptors / Indicators  Stabbing   intermittent "very light stabbing"   Pain Type  Chronic pain    Pain Frequency  Intermittent         [facility] PT Assessment - 10/04/19 0801      Assessment   Medical Diagnosis  Chronic low back pain    Referring Provider (PT)  Janett Billow, MD    Next MD Visit  Feb 2021      Observation/Other Assessments   Focus on Therapeutic Outcomes (FOTO)   Lumbar - 72% (28% limitation)      Strength   Overall Strength Comments  tested in sitting    Right Hip Flexion  5/5    Right Hip Extension  5/5    Right Hip External Rotation  4+/5    Right Hip Internal Rotation  5/5    Right Hip ABduction  5/5    Right Hip ADduction  5/5    Left Hip Flexion  5/5    Left Hip Extension  5/5    Left Hip External Rotation  4+/5    Left Hip Internal Rotation  5/5    Left Hip ABduction  5/5    Left Hip ADduction  5/5    Right Knee Flexion  5/5    Right Knee Extension  5/5    Left Knee Flexion  5/5    Left Knee Extension  5/5    Right Ankle Dorsiflexion  4+/5    Right Ankle Plantar Flexion  4+/5    Left Ankle Dorsiflexion  4+/5    Left Ankle Plantar Flexion  4+/5   limited lift                  [facility] Adult PT Treatment/Exercise - 10/04/19 0801      Lumbar Exercises: Stretches   Other Lumbar Stretch Exercise  Side lying QL stretch 2 x 30" - hips flexed and rotated over front edge of plinth; R/L QL stretch in doorway 2 x 30" each side    Other Lumbar Stretch Exercise  R side lying open bookj stretch 3 x 30"   cues for proper positioning     Lumbar Exercises: Aerobic   Nustep  L4 x 6  min (LEs)      Manual Therapy   Manual Therapy  Soft tissue mobilization;Myofascial release    Manual therapy comments  L sidelying    Soft tissue mobilization  STM to R QL and obliques - tenderness at lower rib with positive TP    Myofascial Release  manual TPR to R QL and obliques             PT Education - 10/04/19 0845    Education Details  HEP review & update - QL & open book stretches    Person(s) Educated  Patient    Methods  Explanation;Demonstration;Handout    Comprehension  Verbalized understanding;Returned demonstration;Need further instruction       PT Short Term Goals - 09/04/19 0942      PT SHORT TERM GOAL #1   Title  Patient will be independent with initial HEP    Status  Achieved   08/16/19     PT SHORT TERM GOAL #2   Title  Patient to report pain reduction in frequency and intensity by >/= 25%    Status  Achieved   08/23/19       PT Long Term Goals - 10/04/19 0812      PT LONG TERM GOAL #1   Title  Patient will be independent with ongoing/advanced HEP    Status  Achieved   10/04/19     PT LONG TERM GOAL #2   Title  Patient to demonstrate appropriate posture and body mechanics needed for daily activities    Status  Achieved   10/04/19     PT LONG TERM GOAL #3   Title  Patient to report pain reduction in frequency and intensity by >/= 50%    Status  Achieved   09/04/19     PT LONG TERM GOAL #4   Title  Patient to report ability to perform ADLs, household and work-related tasks without increased back pain    Status  Achieved   10/04/19     PT LONG TERM GOAL #  5   Title  Patient will report ability to complete his wood working projects with decreased low back pain interference    Status  Achieved   10/04/19           Plan - 10/04/19 0814    Clinical Impression Statement  Bill reporting low back and R flank pain almost gone, only 2/10 at most and much less frequent than before. He reports improved awareness of posture and body mechanics  and demonstrates good technique with supine <> sit to minimize low back strain. He reports no issues with typical daily tasks at home and has been able to resume his woodworking projects (completed an [place] and working on another) without limitation due to pain. Overall LE strength has improved to 4+/5 to 5/5. He denies need for HEP but did provide instruction in QL and open book stretches to continue to address remaining tightness and increased muscle tension in R QL and obliques. All goals met and patient ready to transition to HEP, therefore will proceed with discharge from PT for this episode.    Personal Factors and Comorbidities  Age;Comorbidity 3+;Fitness;Time since onset of injury/illness/exacerbation    Comorbidities  HTN, CAD, CHF, DM-II, peripheral neuropathy, OA s/p R TKA, obesity, depression; further PMHx as above    Examination-Activity Limitations  --    Examination-Participation Restrictions  --    Rehab Potential  Fair    PT Frequency  --    PT Duration  --    PT Treatment/Interventions  ADLs/Self Care Home Management;Cryotherapy;Electrical Stimulation;Iontophoresis 17m/ml Dexamethasone;Moist Heat;Traction;Gait training;Functional mobility training;Therapeutic activities;Therapeutic exercise;Balance training;Neuromuscular re-education;Patient/family education;Manual techniques;Passive range of motion;Dry needling;Taping;Spinal Manipulations;Joint Manipulations    PT Next Visit Plan  Discharge    PT Home Exercise Plan  07/25/19 -  LTR, piriformis & HS stretches, seated prayer stretch; 08/02/19 - modified supine SKTC/KTOS piriformis stretch with towel, lumbopelvic stabilization/strengthening (pelvic tilt, brace march, bent-knee fall-out); 08/14/19 - standing abd bracing + rows/retraction with red TB, seated hip flexor/quad stretch; 09/19/19 - progression of resistance with hooklying clam, march & bridge to green TB, addition of UE/LE dead bug, pallof press & resisted trunk rotation; 10/04/19 -  side lying and standing QL stretches, open book stretch    Consulted and Agree with Plan of Care  Patient       Patient will benefit from skilled therapeutic intervention in order to improve the following deficits and impairments:  Cardiopulmonary status limiting activity, Decreased activity tolerance, Decreased balance, Decreased endurance, Decreased knowledge of precautions, Decreased mobility, Decreased range of motion, Decreased strength, Difficulty walking, Increased muscle spasms, Impaired flexibility, Impaired sensation  Visit Diagnosis: Chronic right-sided low back pain with right-sided sciatica  Muscle spasm of back  Muscle weakness (generalized)     Problem List Patient Active Problem List   Diagnosis Date Noted  . Coronary artery disease 02/11/2018  . Claudication in peripheral vascular disease ([place] 02/11/2018  . Atypical chest pain 01/12/2018  . Precordial chest pain 01/07/2018  . Coronary artery calcification 01/07/2018  . CKD (chronic kidney disease) stage 3, GFR 30-59 ml/min 11/25/2017  . Chronic diastolic heart failure ([place] 05/13/2017  . Aortic stenosis, mild 05/13/2017  . Low back pain 05/12/2017  . GERD (gastroesophageal reflux disease) 05/12/2017  . Prostate nodule 05/12/2017  . Obesity 05/12/2017  . Depression 05/12/2017  . Hx of colonic polyps 05/12/2017  . 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 Type 2 diabetes mellitus ([place] 05/12/2017  . Hyperlipidemia 05/12/2017  . Hypertensive heart disease with heart failure ([place] 05/12/2017  .  Osteoarthritis of knee 05/12/2017  . Sleep apnea 05/12/2017  . Actinic keratosis 05/12/2017  . 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 Seborrheic dermatitis 05/12/2017  . Paresthesia 05/12/2017     PHYSICAL THERAPY DISCHARGE SUMMARY  Visits from Start of Care: 15  Current functional level related to goals / functional outcomes:   Refer to above clinical impression.   Remaining  deficits:   As above.   Education / Equipment:   HEP, Biomedical scientist education  Plan: Patient agrees to discharge.  Patient goals were met. Patient is being discharged due to meeting the stated rehab goals.  ?????      Percival Spanish 10/04/2019, 9:54 AM  [facility] [address]  [place], [place], [address] Phone: [phone]   Fax:  [phone]  Name: CHAUN UEMURA MRN: [id number] Date of Birth: [birth year]

## 2019-10-05 DIAGNOSIS — Z6836 Body mass index (BMI) 36.0-36.9, adult: Secondary | ICD-10-CM | POA: Diagnosis not present

## 2019-10-05 DIAGNOSIS — M5416 Radiculopathy, lumbar region: Secondary | ICD-10-CM | POA: Diagnosis not present

## 2019-10-05 DIAGNOSIS — I10 Essential (primary) hypertension: Secondary | ICD-10-CM | POA: Diagnosis not present

## 2019-10-05 DIAGNOSIS — M48061 Spinal stenosis, lumbar region without neurogenic claudication: Secondary | ICD-10-CM | POA: Diagnosis not present

## 2019-11-27 DIAGNOSIS — B351 Tinea unguium: Secondary | ICD-10-CM | POA: Diagnosis not present

## 2019-11-27 DIAGNOSIS — E119 Type 2 diabetes mellitus without complications: Secondary | ICD-10-CM | POA: Diagnosis not present

## 2019-11-27 DIAGNOSIS — M79671 Pain in right foot: Secondary | ICD-10-CM | POA: Diagnosis not present

## 2019-11-27 DIAGNOSIS — M79672 Pain in left foot: Secondary | ICD-10-CM | POA: Diagnosis not present

## 2019-11-29 DIAGNOSIS — M25562 Pain in left knee: Secondary | ICD-10-CM | POA: Diagnosis not present

## 2019-11-29 DIAGNOSIS — M1712 Unilateral primary osteoarthritis, left knee: Secondary | ICD-10-CM | POA: Diagnosis not present

## 2019-12-07 DIAGNOSIS — M25562 Pain in left knee: Secondary | ICD-10-CM | POA: Diagnosis not present

## 2019-12-07 DIAGNOSIS — M1712 Unilateral primary osteoarthritis, left knee: Secondary | ICD-10-CM | POA: Diagnosis not present

## 2019-12-11 ENCOUNTER — Emergency Department (HOSPITAL_COMMUNITY): Payer: Medicare Other

## 2019-12-11 ENCOUNTER — Inpatient Hospital Stay (HOSPITAL_COMMUNITY)
Admission: EM | Admit: 2019-12-11 | Discharge: 2019-12-15 | DRG: 071 | Disposition: A | Discharge status: Home Health | Payer: Medicare Other | Attending: Internal Medicine | Admitting: Internal Medicine

## 2019-12-11 ENCOUNTER — Other Ambulatory Visit: Payer: Self-pay

## 2019-12-11 DIAGNOSIS — Z791 Long term (current) use of non-steroidal anti-inflammatories (NSAID): Secondary | ICD-10-CM | POA: Diagnosis not present

## 2019-12-11 DIAGNOSIS — E785 Hyperlipidemia, unspecified: Secondary | ICD-10-CM | POA: Diagnosis not present

## 2019-12-11 DIAGNOSIS — M1712 Unilateral primary osteoarthritis, left knee: Secondary | ICD-10-CM | POA: Diagnosis not present

## 2019-12-11 DIAGNOSIS — R410 Disorientation, unspecified: Secondary | ICD-10-CM

## 2019-12-11 DIAGNOSIS — K219 Gastro-esophageal reflux disease without esophagitis: Secondary | ICD-10-CM | POA: Diagnosis present

## 2019-12-11 DIAGNOSIS — Z03818 Encounter for observation for suspected exposure to other biological agents ruled out: Secondary | ICD-10-CM | POA: Diagnosis not present

## 2019-12-11 DIAGNOSIS — M5416 Radiculopathy, lumbar region: Secondary | ICD-10-CM | POA: Diagnosis present

## 2019-12-11 DIAGNOSIS — Z7984 Long term (current) use of oral hypoglycemic drugs: Secondary | ICD-10-CM | POA: Diagnosis not present

## 2019-12-11 DIAGNOSIS — Z820 Family history of epilepsy and other diseases of the nervous system: Secondary | ICD-10-CM

## 2019-12-11 DIAGNOSIS — I130 Hypertensive heart and chronic kidney disease with heart failure and stage 1 through stage 4 chronic kidney disease, or unspecified chronic kidney disease: Secondary | ICD-10-CM | POA: Diagnosis present

## 2019-12-11 DIAGNOSIS — F17210 Nicotine dependence, cigarettes, uncomplicated: Secondary | ICD-10-CM | POA: Diagnosis present

## 2019-12-11 DIAGNOSIS — E1122 Type 2 diabetes mellitus with diabetic chronic kidney disease: Secondary | ICD-10-CM | POA: Diagnosis present

## 2019-12-11 DIAGNOSIS — Z7982 Long term (current) use of aspirin: Secondary | ICD-10-CM

## 2019-12-11 DIAGNOSIS — E782 Mixed hyperlipidemia: Secondary | ICD-10-CM | POA: Diagnosis not present

## 2019-12-11 DIAGNOSIS — I5032 Chronic diastolic (congestive) heart failure: Secondary | ICD-10-CM | POA: Diagnosis present

## 2019-12-11 DIAGNOSIS — R4182 Altered mental status, unspecified: Secondary | ICD-10-CM | POA: Diagnosis not present

## 2019-12-11 DIAGNOSIS — E1151 Type 2 diabetes mellitus with diabetic peripheral angiopathy without gangrene: Secondary | ICD-10-CM | POA: Diagnosis present

## 2019-12-11 DIAGNOSIS — I10 Essential (primary) hypertension: Secondary | ICD-10-CM | POA: Diagnosis present

## 2019-12-11 DIAGNOSIS — R55 Syncope and collapse: Secondary | ICD-10-CM | POA: Diagnosis not present

## 2019-12-11 DIAGNOSIS — I2510 Atherosclerotic heart disease of native coronary artery without angina pectoris: Secondary | ICD-10-CM | POA: Diagnosis present

## 2019-12-11 DIAGNOSIS — Z91038 Other insect allergy status: Secondary | ICD-10-CM | POA: Diagnosis not present

## 2019-12-11 DIAGNOSIS — I6523 Occlusion and stenosis of bilateral carotid arteries: Secondary | ICD-10-CM | POA: Diagnosis not present

## 2019-12-11 DIAGNOSIS — Z8249 Family history of ischemic heart disease and other diseases of the circulatory system: Secondary | ICD-10-CM | POA: Diagnosis not present

## 2019-12-11 DIAGNOSIS — Z96651 Presence of right artificial knee joint: Secondary | ICD-10-CM | POA: Diagnosis present

## 2019-12-11 DIAGNOSIS — F32A Depression, unspecified: Secondary | ICD-10-CM | POA: Diagnosis present

## 2019-12-11 DIAGNOSIS — E1165 Type 2 diabetes mellitus with hyperglycemia: Secondary | ICD-10-CM | POA: Diagnosis not present

## 2019-12-11 DIAGNOSIS — Z20822 Contact with and (suspected) exposure to covid-19: Secondary | ICD-10-CM | POA: Diagnosis present

## 2019-12-11 DIAGNOSIS — E119 Type 2 diabetes mellitus without complications: Secondary | ICD-10-CM

## 2019-12-11 DIAGNOSIS — I350 Nonrheumatic aortic (valve) stenosis: Secondary | ICD-10-CM | POA: Diagnosis present

## 2019-12-11 DIAGNOSIS — N183 Chronic kidney disease, stage 3 unspecified: Secondary | ICD-10-CM | POA: Diagnosis present

## 2019-12-11 DIAGNOSIS — T380X5A Adverse effect of glucocorticoids and synthetic analogues, initial encounter: Secondary | ICD-10-CM | POA: Diagnosis present

## 2019-12-11 DIAGNOSIS — G9340 Encephalopathy, unspecified: Secondary | ICD-10-CM

## 2019-12-11 DIAGNOSIS — F329 Major depressive disorder, single episode, unspecified: Secondary | ICD-10-CM | POA: Diagnosis not present

## 2019-12-11 DIAGNOSIS — Z720 Tobacco use: Secondary | ICD-10-CM | POA: Diagnosis not present

## 2019-12-11 DIAGNOSIS — R9401 Abnormal electroencephalogram [EEG]: Secondary | ICD-10-CM | POA: Diagnosis not present

## 2019-12-11 DIAGNOSIS — G9341 Metabolic encephalopathy: Principal | ICD-10-CM | POA: Diagnosis present

## 2019-12-11 DIAGNOSIS — R270 Ataxia, unspecified: Secondary | ICD-10-CM | POA: Diagnosis not present

## 2019-12-11 DIAGNOSIS — G8929 Other chronic pain: Secondary | ICD-10-CM | POA: Diagnosis present

## 2019-12-11 DIAGNOSIS — E538 Deficiency of other specified B group vitamins: Secondary | ICD-10-CM | POA: Diagnosis present

## 2019-12-11 LAB — COMPREHENSIVE METABOLIC PANEL
ALT: 23 U/L (ref 0–44)
AST: 18 U/L (ref 15–41)
Albumin: 3.6 g/dL (ref 3.5–5.0)
Alkaline Phosphatase: 109 U/L (ref 38–126)
Anion gap: 9 (ref 5–15)
BUN: 23 mg/dL (ref 8–23)
CO2: 23 mmol/L (ref 22–32)
Calcium: 9.2 mg/dL (ref 8.9–10.3)
Chloride: 103 mmol/L (ref 98–111)
Creatinine, Ser: 1.18 mg/dL (ref 0.61–1.24)
GFR calc Af Amer: >60 mL/min (ref >60)
GFR calc non Af Amer: >60 mL/min (ref >60)
Glucose, Bld: 388 mg/dL — ABNORMAL HIGH (ref 70–99)
Potassium: 4.4 mmol/L (ref 3.5–5.1)
Sodium: 135 mmol/L (ref 135–145)
Total Bilirubin: 0.6 mg/dL (ref 0.3–1.2)
Total Protein: 6.1 g/dL — ABNORMAL LOW (ref 6.5–8.1)

## 2019-12-11 LAB — PROTIME-INR
INR: 1 (ref 0.8–1.2)
Prothrombin Time: 13.2 seconds (ref 11.4–15.2)

## 2019-12-11 LAB — RAPID URINE DRUG SCREEN, HOSP PERFORMED
Amphetamines: NOT DETECTED
Barbiturates: NOT DETECTED
Benzodiazepines: NOT DETECTED
Cocaine: NOT DETECTED
Opiates: POSITIVE — AB
Tetrahydrocannabinol: NOT DETECTED

## 2019-12-11 LAB — DIFFERENTIAL
Abs Immature Granulocytes: 0.05 10*3/uL (ref 0.00–0.07)
Basophils Absolute: 0.1 10*3/uL (ref 0.0–0.1)
Basophils Relative: 1 %
Eosinophils Absolute: 0.1 10*3/uL (ref 0.0–0.5)
Eosinophils Relative: 1 %
Immature Granulocytes: 1 %
Lymphocytes Relative: 12 %
Lymphs Abs: 1.3 10*3/uL (ref 0.7–4.0)
Monocytes Absolute: 0.6 10*3/uL (ref 0.1–1.0)
Monocytes Relative: 6 %
Neutro Abs: 8.3 10*3/uL — ABNORMAL HIGH (ref 1.7–7.7)
Neutrophils Relative %: 79 %

## 2019-12-11 LAB — I-STAT CHEM 8, ED
BUN: 25 mg/dL — ABNORMAL HIGH (ref 8–23)
Calcium, Ion: 1.17 mmol/L (ref 1.15–1.40)
Chloride: 102 mmol/L (ref 98–111)
Creatinine, Ser: 1 mg/dL (ref 0.61–1.24)
Glucose, Bld: 376 mg/dL — ABNORMAL HIGH (ref 70–99)
HCT: 46 % (ref 39.0–52.0)
Hemoglobin: 15.6 g/dL (ref 13.0–17.0)
Potassium: 4.2 mmol/L (ref 3.5–5.1)
Sodium: 136 mmol/L (ref 135–145)
TCO2: 26 mmol/L (ref 22–32)

## 2019-12-11 LAB — ETHANOL: Alcohol, Ethyl (B): <10 mg/dL (ref <10)

## 2019-12-11 LAB — AMMONIA: Ammonia: 23 umol/L (ref 9–35)

## 2019-12-11 LAB — CBC
HCT: 46.5 % (ref 39.0–52.0)
HCT: 47.7 % (ref 39.0–52.0)
Hemoglobin: 15.8 g/dL (ref 13.0–17.0)
Hemoglobin: 16.3 g/dL (ref 13.0–17.0)
MCH: 30.8 pg (ref 26.0–34.0)
MCH: 30.8 pg (ref 26.0–34.0)
MCHC: 34 g/dL (ref 30.0–36.0)
MCHC: 34.2 g/dL (ref 30.0–36.0)
MCV: 90.6 fL (ref 80.0–100.0)
MCV: 90 fL (ref 80.0–100.0)
Platelets: 266 10*3/uL (ref 150–400)
Platelets: 289 10*3/uL (ref 150–400)
RBC: 5.13 MIL/uL (ref 4.22–5.81)
RBC: 5.3 MIL/uL (ref 4.22–5.81)
RDW: 12.4 % (ref 11.5–15.5)
RDW: 12.6 % (ref 11.5–15.5)
WBC: 10.4 10*3/uL (ref 4.0–10.5)
WBC: 10.6 10*3/uL — ABNORMAL HIGH (ref 4.0–10.5)
nRBC: 0 % (ref 0.0–0.2)
nRBC: 0 % (ref 0.0–0.2)

## 2019-12-11 LAB — CREATININE, SERUM
Creatinine, Ser: 1.03 mg/dL (ref 0.61–1.24)
GFR calc Af Amer: >60 mL/min (ref >60)
GFR calc non Af Amer: >60 mL/min (ref >60)

## 2019-12-11 LAB — SARS CORONAVIRUS 2 (TAT 6-24 HRS): SARS Coronavirus 2: NEGATIVE

## 2019-12-11 LAB — CBG MONITORING, ED: Glucose-Capillary: 368 mg/dL — ABNORMAL HIGH (ref 70–99)

## 2019-12-11 LAB — VITAMIN B12: Vitamin B-12: 288 pg/mL (ref 180–914)

## 2019-12-11 LAB — TSH: TSH: 2.575 u[IU]/mL (ref 0.350–4.500)

## 2019-12-11 LAB — APTT: aPTT: 28 seconds (ref 24–36)

## 2019-12-11 LAB — GLUCOSE, CAPILLARY: Glucose-Capillary: 240 mg/dL — ABNORMAL HIGH (ref 70–99)

## 2019-12-11 LAB — HEMOGLOBIN A1C
Hgb A1c MFr Bld: 8.7 % — ABNORMAL HIGH (ref 4.8–5.6)
Mean Plasma Glucose: 202.99 mg/dL

## 2019-12-11 LAB — MAGNESIUM: Magnesium: 1.8 mg/dL (ref 1.7–2.4)

## 2019-12-11 MED ORDER — ONDANSETRON HCL 4 MG/2ML IJ SOLN: 4.0000 mg | Freq: Four times a day (QID) | INTRAMUSCULAR | Status: DC | PRN

## 2019-12-11 MED ORDER — SODIUM CHLORIDE 0.9% FLUSH
3.0000 mL | Freq: Once | INTRAVENOUS | Status: AC
  Administered 2019-12-11: 16:00:00 3 mL via INTRAVENOUS

## 2019-12-11 MED ORDER — IOHEXOL 350 MG/ML SOLN
100.0000 mL | Freq: Once | INTRAVENOUS | Status: AC | PRN
  Administered 2019-12-11: 15:00:00 100 mL via INTRAVENOUS

## 2019-12-11 MED ORDER — INSULIN ASPART 100 UNIT/ML ~~LOC~~ SOLN
0.0000 [IU] | Freq: Every day | SUBCUTANEOUS | Status: DC
  Administered 2019-12-11: 22:00:00 2 [IU] via SUBCUTANEOUS
  Administered 2019-12-13: 22:00:00 3 [IU] via SUBCUTANEOUS
  Administered 2019-12-14: 2 [IU] via SUBCUTANEOUS

## 2019-12-11 MED ORDER — LORAZEPAM 2 MG/ML IJ SOLN
1.0000 mg | Freq: Once | INTRAMUSCULAR | Status: AC
  Administered 2019-12-11: 16:00:00 1 mg via INTRAVENOUS
  Filled 2019-12-11: qty 1

## 2019-12-11 MED ORDER — ACETAMINOPHEN 325 MG PO TABS
650.0000 mg | ORAL_TABLET | Freq: Four times a day (QID) | ORAL | Status: DC | PRN
  Administered 2019-12-13: 650 mg via ORAL
  Filled 2019-12-11: qty 2

## 2019-12-11 MED ORDER — ENOXAPARIN SODIUM 40 MG/0.4ML ~~LOC~~ SOLN: 40.0000 mg | SUBCUTANEOUS | Status: DC

## 2019-12-11 MED ORDER — INSULIN ASPART 100 UNIT/ML ~~LOC~~ SOLN
0.0000 [IU] | Freq: Three times a day (TID) | SUBCUTANEOUS | Status: DC
  Administered 2019-12-12: 2 [IU] via SUBCUTANEOUS
  Administered 2019-12-12: 18:00:00 3 [IU] via SUBCUTANEOUS
  Administered 2019-12-12 – 2019-12-13 (×2): 8 [IU] via SUBCUTANEOUS
  Administered 2019-12-13: 2 [IU] via SUBCUTANEOUS
  Administered 2019-12-13: 13:00:00 15 [IU] via SUBCUTANEOUS
  Administered 2019-12-14: 12:00:00 11 [IU] via SUBCUTANEOUS
  Administered 2019-12-14: 8 [IU] via SUBCUTANEOUS
  Administered 2019-12-14 – 2019-12-15 (×2): 5 [IU] via SUBCUTANEOUS

## 2019-12-11 MED ORDER — ONDANSETRON HCL 4 MG PO TABS: 4.0000 mg | ORAL_TABLET | Freq: Four times a day (QID) | ORAL | Status: DC | PRN

## 2019-12-11 MED ORDER — ACETAMINOPHEN 650 MG RE SUPP: 650.0000 mg | Freq: Four times a day (QID) | RECTAL | Status: DC | PRN

## 2019-12-11 NOTE — ED Notes (Signed)
Patient to MRI scanner

## 2019-12-11 NOTE — ED Provider Notes (Signed)
After Dr Sabra Heck had left shift earlier, hospitalist called to indicate pt was refusing admission, and to call them back if he consented.   Patient has gone to MRI, but even w meds, and encouragement, pt unable to comply with remaining still and completing MRI.  On recheck pt, is calm and alert. He has spoken with spouse. Spouse feels he needs admission, and pt is currently agreeable, albeit not very excited about being admitted.  Family and ED team reiterated reasons for admission. In any event, given patients mental status/confusion, unclear that patient currently has capacity to decline admission at this point, and do feel patient would benefit from admission, possible adjustment meds, neurology input, and possible behavioral health input as well.  Hospitalists re-consulted for admission.      Lajean Saver, MD 12/11/19 [phone]

## 2019-12-11 NOTE — ED Triage Notes (Signed)
Pt arrives to ED w/ c/o loss of balance since waking up this morning. Pt AOx4 neuro intact in triage.

## 2019-12-11 NOTE — ED Provider Notes (Signed)
[facility] EMERGENCY DEPARTMENT Provider Note   CSN: [id number] Arrival date & time: 12/11/19  1225     History Chief Complaint  Patient presents with  . Stroke Symptoms    Thomas [name] is a 74 y.o. male.  HPI   This patient is a 74 year old male, known history of diabetes, history of hypertension, no prior history of stroke as far as he knows.  He presents to the hospital today with a potential passing out spell.  It is not very clear, the patient is only to tell me that he passed out, he was in his driveway, he does not recall the circumstances, when I asked him to tell me what happens he continues to say the same thing over and over "I passed out".  He states he is having a hard time talking, he is not able to give me any other information due to this difficulty with answering questions.  Level 5 caveat applies.  The patient significant other brought him to the hospital, she is not available at this time, I have tried to reach out by phone but there is no mobile phone number for her.  Past Medical History:  Diagnosis Date  . Actinic keratosis 05/12/2017  . Back pain   . Depression 05/12/2017  . Diabetes ([place])   . GERD (gastroesophageal reflux disease) 05/12/2017  . Hx of colonic polyps 05/12/2017  . Hyperlipidemia 05/12/2017  . Hypertension   . 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 Low back pain 05/12/2017  . Lumbar radiculopathy 05/12/2017  . 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 Obesity 05/12/2017  . Osteoarthritis of knee 05/12/2017  . Paresthesia 05/12/2017  . 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 Prostate nodule 05/12/2017  . Seborrheic dermatitis 05/12/2017    Patient Active Problem List   Diagnosis Date Noted  . Hypertension   . Acute metabolic encephalopathy   . Coronary artery disease 02/11/2018  . Claudication in peripheral vascular disease ([place]) 02/11/2018  . Atypical chest pain 01/12/2018  . Precordial chest pain 01/07/2018  . Coronary artery calcification 01/07/2018   . CKD (chronic kidney disease) stage 3, GFR 30-59 ml/min 11/25/2017  . Chronic diastolic heart failure ([place]) 05/13/2017  . Aortic stenosis, mild 05/13/2017  . Low back pain 05/12/2017  . GERD (gastroesophageal reflux disease) 05/12/2017  . Prostate nodule 05/12/2017  . Obesity 05/12/2017  . Depression 05/12/2017  . Hx of colonic polyps 05/12/2017  . 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 Type 2 diabetes mellitus ([place]) 05/12/2017  . Hyperlipidemia 05/12/2017  . 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 Osteoarthritis of knee 05/12/2017  . Sleep apnea 05/12/2017  . Actinic keratosis 05/12/2017  . 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 Seborrheic dermatitis 05/12/2017  . Paresthesia 05/12/2017    Past Surgical History:  Procedure Laterality Date  . HAND SURGERY    . REPLACEMENT TOTAL KNEE Right   . RIGHT/LEFT HEART CATH AND CORONARY ANGIOGRAPHY N/A 01/12/2018   Procedure: RIGHT/LEFT HEART CATH AND CORONARY ANGIOGRAPHY;  Surgeon: Belva Crome, MD;  Location: [facility];  Service: Cardiovascular;  Laterality: N/A;  . SHOULDER SURGERY Right        Family History  Problem Relation Age of Onset  . Hypertension Mother   . Hypertension Father   . Alzheimer's disease Father     Social History   Tobacco Use  . Smoking status: Current Every Day Smoker    Packs/day: 1.00    Years:  55.00    Pack years: 55.00  . Smokeless tobacco: Never Used  . Tobacco comment: Has cut back  Substance Use Topics  . Alcohol use: No  . Drug use: No    Home Medications Prior to Admission medications   Medication Sig Start Date End Date Taking? Authorizing Provider  aspirin 81 MG chewable tablet Chew 81 mg by mouth daily.    [provider]  atorvastatin (LIPITOR) 80 MG tablet Take 80 mg by mouth daily.    [provider]  DULoxetine (CYMBALTA) 20 MG capsule Take 20 mg by mouth daily.    [provider]   EPINEPHrine 0.3 mg/0.3 mL IJ SOAJ injection Inject 0.3 mg into the muscle daily as needed (for anaphylaxis).     [provider]  glipiZIDE (GLUCOTROL) 5 MG tablet Take 2.5 mg by mouth 2 (two) times daily.     [provider]  loratadine (CLARITIN) 10 MG tablet Take 10 mg by mouth daily.    [provider]  losartan (COZAAR) 25 MG tablet Take 25 mg by mouth daily.    [provider]  meloxicam (MOBIC) 15 MG tablet Take 15 mg by mouth daily. Half a tablet twice daily    [provider]  metFORMIN (GLUCOPHAGE) 1000 MG tablet Take 1,000 mg by mouth 2 (two) times daily.     [provider]  methadone (DOLOPHINE) 5 MG tablet Take 5 mg by mouth daily.    [provider]  nitroGLYCERIN (NITROSTAT) 0.4 MG SL tablet Place 1 tablet (0.4 mg total) under the tongue every 5 (five) minutes as needed for chest pain. 01/11/18 04/11/18  Park Liter, MD  oxyCODONE-acetaminophen (PERCOCET/ROXICET) 5-325 MG tablet Take 1 tablet by mouth 5 (five) times daily as needed for severe pain.     [provider]  terazosin (HYTRIN) 2 MG capsule Take 2 mg by mouth at bedtime.    [provider]    Allergies    Wasp venom  Review of Systems   Review of Systems  Unable to perform ROS: Acuity of condition    Physical Exam Updated Vital Signs BP (!) 173/88   Pulse 63   Temp 97.9 F (36.6 C) (Oral)   Resp 10   SpO2 97%   Physical Exam Vitals and nursing note reviewed.  Constitutional:      General: He is not in acute distress.    Appearance: He is well-developed.  HENT:     Head: Normocephalic and atraumatic.     Mouth/Throat:     Pharynx: No oropharyngeal exudate.  Eyes:     General: No scleral icterus.       Right eye: No discharge.        Left eye: No discharge.     Conjunctiva/sclera: Conjunctivae normal.     Pupils: Pupils are equal, round, and reactive to light.  Neck:     Thyroid: No thyromegaly.     Vascular: No  JVD.  Cardiovascular:     Rate and Rhythm: Normal rate and regular rhythm.     Heart sounds: Normal heart sounds. No murmur. No friction rub. No gallop.   Pulmonary:     Effort: Pulmonary effort is normal. No respiratory distress.     Breath sounds: Normal breath sounds. No wheezing or rales.  Abdominal:     General: Bowel sounds are normal. There is no distension.     Palpations: Abdomen is soft. There is no mass.  Tenderness: There is no abdominal tenderness.  Musculoskeletal:        General: No tenderness. Normal range of motion.     Cervical back: Normal range of motion and neck supple.  Lymphadenopathy:     Cervical: No cervical adenopathy.  Skin:    General: Skin is warm and dry.     Findings: No erythema or rash.  Neurological:     Mental Status: He is alert.     Coordination: Coordination normal.     Comments: The patient is able to stand, he seems to have normal balance, he is able to follow commands with all 4 extremities with normal strength and sensation, cranial nerves III through XII seem to be normal, no facial droop, no sensory deficit, normal finger-nose-finger.  The patient ambulates with a cane at baseline for his gait.  His memory is difficult to obtain, he is having difficulty answering questions, his speech is clear when he does talk and sometimes he is able to say things clearly sometimes he has trouble finding what to say and has repetitive speech  Psychiatric:        Behavior: Behavior normal.     ED Results / Procedures / Treatments   Labs (all labs ordered are listed, but only abnormal results are displayed) Labs Reviewed  DIFFERENTIAL - Abnormal; Notable for the following components:      Result Value   Neutro Abs 8.3 (*)    All other components within normal limits  COMPREHENSIVE METABOLIC PANEL - Abnormal; Notable for the following components:   Glucose, Bld 388 (*)    Total Protein 6.1 (*)    All other components within normal limits  CBG  MONITORING, ED - Abnormal; Notable for the following components:   Glucose-Capillary 368 (*)    All other components within normal limits  I-STAT CHEM 8, ED - Abnormal; Notable for the following components:   BUN 25 (*)    Glucose, Bld 376 (*)    All other components within normal limits  SARS CORONAVIRUS 2 (TAT 6-24 HRS)  PROTIME-INR  APTT  CBC  ETHANOL  RAPID URINE DRUG SCREEN, HOSP PERFORMED  AMMONIA  TSH  VITAMIN B12  VITAMIN B1  CBG MONITORING, ED    EKG EKG Interpretation  Date/Time:  Monday December 11 2019 13:08:59 EDT Ventricular Rate:  71 PR Interval:    QRS Duration: 98 QT Interval:  431 QTC Calculation: 469 R Axis:   -62 Text Interpretation: Sinus rhythm Left anterior fascicular block Abnormal R-wave progression, late transition Abnormal T, consider ischemia, lateral leads since last tracing no significant change Confirmed by Noemi Chapel [phone]) on 12/11/2019 1:45:55 PM   Radiology CT Angio Head W or Wo Contrast  Result Date: 12/11/2019 CLINICAL DATA:  Imbalance. EXAM: CT ANGIOGRAPHY HEAD AND NECK CT PERFUSION BRAIN TECHNIQUE: Multidetector CT imaging of the head and neck was performed using the standard protocol during bolus administration of intravenous contrast. Multiplanar CT image reconstructions and MIPs were obtained to evaluate the vascular anatomy. Carotid stenosis measurements (when applicable) are obtained utilizing NASCET criteria, using the distal internal carotid diameter as the denominator. Multiphase CT imaging of the brain was performed following IV bolus contrast injection. Subsequent parametric perfusion maps were calculated using RAPID software. CONTRAST:  163m OMNIPAQUE IOHEXOL 350 MG/ML SOLN COMPARISON:  Noncontrast head CT earlier today. No prior angiographic imaging. FINDINGS: CTA NECK FINDINGS Aortic arch: Standard 3 vessel aortic arch with mild atherosclerotic plaque. Widely patent arch vessel origins. Right carotid system:  Patent with small  volume calcified and soft plaque at the carotid bifurcation. No evidence of stenosis or dissection. Left carotid system: Patent with predominantly soft plaque in the mid common carotid artery and small volume calcified plaque in the carotid bulb. No evidence of significant stenosis or dissection. Vertebral arteries: Patent with the left being mildly to moderately dominant. Calcified plaque at the origins of both vertebral arteries without evidence of significant stenosis or dissection. Skeleton: Moderate cervical disc and facet degeneration. Other neck: No evidence of cervical lymphadenopathy or mass. Upper chest: Clear lung apices. Review of the MIP images confirms the above findings CTA HEAD FINDINGS Anterior circulation: The internal carotid arteries are patent from skull base to carotid termini with atherosclerotic plaque resulting in moderate stenosis near the left cavernous-petrous junction and in the left cavernous segment. ACAs and MCAs are patent without evidence of proximal branch occlusion or significant A1 or M1 stenosis. Branch vessel evaluation is limited by early contrast timing. No aneurysm is identified. Posterior circulation: The intracranial vertebral arteries are widely patent to the basilar. The left PICA and right AICA appear dominant. Patent SCAs are seen bilaterally. The basilar artery is widely patent. There is a small left posterior communicating artery. Both PCAs are patent without evidence of significant proximal stenosis on the left. There is a severe mid right P2 stenosis. No aneurysm is identified. Venous sinuses: Not evaluated due to contrast timing. Anatomic variants: None. Review of the MIP images confirms the above findings CT Brain Perfusion Findings: ASPECTS: Not scored with this history CBF (<30%) Volume: 0 mL Perfusion (Tmax>6.0s) volume: 0 mL Mismatch Volume: 0 mL Infarction Location: None evident by CTP IMPRESSION: 1. No large vessel occlusion. 2. Intracranial atherosclerosis  with moderate left ICA and severe right P2 stenoses. 3. Cervical carotid and vertebral artery atherosclerosis without stenosis. 4. Negative CTP. 5. Aortic Atherosclerosis (ICD10-I70.0). Electronically Signed   By: Logan Bores M.D.   On: 12/11/2019 14:58   CT HEAD WO CONTRAST  Result Date: 12/11/2019 CLINICAL DATA:  Ataxia with balance loss EXAM: CT HEAD WITHOUT CONTRAST TECHNIQUE: Contiguous axial images were obtained from the base of the skull through the vertex without intravenous contrast. COMPARISON:  None. FINDINGS: Brain: There is mild diffuse atrophy. There is no intracranial mass, hemorrhage, extra-axial fluid collection, or midline shift. There is mild small vessel disease in the centra semiovale bilaterally. Elsewhere brain parenchyma appears unremarkable. There is no demonstrable acute infarct. Vascular: There is no hyperdense vessel. There is calcification in each carotid siphon region. Skull: Bony calvarium appears intact. Sinuses/Orbits: There is mucosal thickening in several ethmoid air cells. Other paranasal sinuses are clear. Orbits appear symmetric bilaterally. Other: Mastoid air cells are clear. IMPRESSION: Mild diffuse atrophy with mild periventricular small vessel disease. No evident acute infarct. No mass or hemorrhage. There are foci of arterial vascular calcification. There is mucosal thickening in several ethmoid air cells. Electronically Signed   By: Lowella Grip III M.D.   On: 12/11/2019 13:56   CT Angio Neck W and/or Wo Contrast  Result Date: 12/11/2019 CLINICAL DATA:  Imbalance. EXAM: CT ANGIOGRAPHY HEAD AND NECK CT PERFUSION BRAIN TECHNIQUE: Multidetector CT imaging of the head and neck was performed using the standard protocol during bolus administration of intravenous contrast. Multiplanar CT image reconstructions and MIPs were obtained to evaluate the vascular anatomy. Carotid stenosis measurements (when applicable) are obtained utilizing NASCET criteria, using the distal  internal carotid diameter as the denominator. Multiphase CT imaging of the brain was performed following  IV bolus contrast injection. Subsequent parametric perfusion maps were calculated using RAPID software. CONTRAST:  180m OMNIPAQUE IOHEXOL 350 MG/ML SOLN COMPARISON:  Noncontrast head CT earlier today. No prior angiographic imaging. FINDINGS: CTA NECK FINDINGS Aortic arch: Standard 3 vessel aortic arch with mild atherosclerotic plaque. Widely patent arch vessel origins. Right carotid system: Patent with small volume calcified and soft plaque at the carotid bifurcation. No evidence of stenosis or dissection. Left carotid system: Patent with predominantly soft plaque in the mid common carotid artery and small volume calcified plaque in the carotid bulb. No evidence of significant stenosis or dissection. Vertebral arteries: Patent with the left being mildly to moderately dominant. Calcified plaque at the origins of both vertebral arteries without evidence of significant stenosis or dissection. Skeleton: Moderate cervical disc and facet degeneration. Other neck: No evidence of cervical lymphadenopathy or mass. Upper chest: Clear lung apices. Review of the MIP images confirms the above findings CTA HEAD FINDINGS Anterior circulation: The internal carotid arteries are patent from skull base to carotid termini with atherosclerotic plaque resulting in moderate stenosis near the left cavernous-petrous junction and in the left cavernous segment. ACAs and MCAs are patent without evidence of proximal branch occlusion or significant A1 or M1 stenosis. Branch vessel evaluation is limited by early contrast timing. No aneurysm is identified. Posterior circulation: The intracranial vertebral arteries are widely patent to the basilar. The left PICA and right AICA appear dominant. Patent SCAs are seen bilaterally. The basilar artery is widely patent. There is a small left posterior communicating artery. Both PCAs are patent without  evidence of significant proximal stenosis on the left. There is a severe mid right P2 stenosis. No aneurysm is identified. Venous sinuses: Not evaluated due to contrast timing. Anatomic variants: None. Review of the MIP images confirms the above findings CT Brain Perfusion Findings: ASPECTS: Not scored with this history CBF (<30%) Volume: 0 mL Perfusion (Tmax>6.0s) volume: 0 mL Mismatch Volume: 0 mL Infarction Location: None evident by CTP IMPRESSION: 1. No large vessel occlusion. 2. 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 3. Cervical carotid and vertebral artery atherosclerosis without stenosis. 4. Negative CTP. 5. Aortic Atherosclerosis (ICD10-I70.0). Electronically Signed   By: ALogan BoresM.D.   On: 12/11/2019 14:58   CT CEREBRAL PERFUSION W CM (CHARM STUDY)  Result Date: 12/11/2019 CLINICAL DATA:  Imbalance. EXAM: CT ANGIOGRAPHY HEAD AND NECK CT PERFUSION BRAIN TECHNIQUE: Multidetector CT imaging of the head and neck was performed using the standard protocol during bolus administration of intravenous contrast. Multiplanar CT image reconstructions and MIPs were obtained to evaluate the vascular anatomy. Carotid stenosis measurements (when applicable) are obtained utilizing NASCET criteria, using the distal internal carotid diameter as the denominator. Multiphase CT imaging of the brain was performed following IV bolus contrast injection. Subsequent parametric perfusion maps were calculated using RAPID software. CONTRAST:  1071mOMNIPAQUE IOHEXOL 350 MG/ML SOLN COMPARISON:  Noncontrast head CT earlier today. No prior angiographic imaging. FINDINGS: CTA NECK FINDINGS Aortic arch: Standard 3 vessel aortic arch with mild atherosclerotic plaque. Widely patent arch vessel origins. Right carotid system: Patent with small volume calcified and soft plaque at the carotid bifurcation. No evidence of stenosis or dissection. Left carotid system: Patent with predominantly soft  plaque in the mid common carotid artery and small volume calcified plaque in the carotid bulb. No evidence of significant stenosis or dissection. Vertebral arteries: Patent with the left being mildly to moderately dominant. Calcified plaque at the origins of both vertebral arteries  without evidence of significant stenosis or dissection. Skeleton: Moderate cervical disc and facet degeneration. Other neck: No evidence of cervical lymphadenopathy or mass. Upper chest: Clear lung apices. Review of the MIP images confirms the above findings CTA HEAD FINDINGS Anterior circulation: The internal carotid arteries are patent from skull base to carotid termini with atherosclerotic plaque resulting in moderate stenosis near the left cavernous-petrous junction and in the left cavernous segment. ACAs and MCAs are patent without evidence of proximal branch occlusion or significant A1 or M1 stenosis. Branch vessel evaluation is limited by early contrast timing. No aneurysm is identified. Posterior circulation: The intracranial vertebral arteries are widely patent to the basilar. The left PICA and right AICA appear dominant. Patent SCAs are seen bilaterally. The basilar artery is widely patent. There is a small left posterior communicating artery. Both PCAs are patent without evidence of significant proximal stenosis on the left. There is a severe mid right P2 stenosis. No aneurysm is identified. Venous sinuses: Not evaluated due to contrast timing. Anatomic variants: None. Review of the MIP images confirms the above findings CT Brain Perfusion Findings: ASPECTS: Not scored with this history CBF (<30%) Volume: 0 mL Perfusion (Tmax>6.0s) volume: 0 mL Mismatch Volume: 0 mL Infarction Location: None evident by CTP IMPRESSION: 1. No large vessel occlusion. 2. 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 3. Cervical carotid and vertebral artery atherosclerosis without stenosis. 4. Negative CTP. 5.  Aortic Atherosclerosis (ICD10-I70.0). Electronically Signed   By: Logan Bores M.D.   On: 12/11/2019 14:58    Procedures Procedures (including critical care time)  Medications Ordered in ED Medications  sodium chloride flush (NS) 0.9 % injection 3 mL (has no administration in time range)  LORazepam (ATIVAN) injection 1 mg (has no administration in time range)  iohexol (OMNIPAQUE) 350 MG/ML injection 100 mL (100 mLs Intravenous Contrast Given 12/11/19 1430)    ED Course  I have reviewed the triage vital signs and the nursing notes.  Pertinent labs & imaging results that were available during my care of the patient were reviewed by me and considered in my medical decision making (see chart for details).    MDM Rules/Calculators/A&P                      The patient does not have any focal weakness numbness or difficulty with coordination.  He has no focal cranial nerve deficits but has some speech difficulty.  There is no additional historian is here at this time either.  We will obtain a CT scan of the brain and labs, awaiting family for further information.  I have personally reviewed the medical record  No significant findings in the medical record to suggest the cause of the patient's symptoms   This patient presents to the ED for concern of difficulty with speech and possible syncope, this involves an extensive number of treatment options, and is a complaint that carries with it a high risk of complications and morbidity.  The differential diagnosis includes syncope, stroke, seizure, electrolyte disturbance, anemia, aneurysm   Lab Tests:   I Ordered, reviewed, and interpreted labs, which included CBC, metabolic panel, EKG, CT scan of the brain.  The patient is hyperglycemic but not in DKA.   Imaging Studies ordered:   I ordered imaging studies which included CT scan of the brain and  I independently visualized and interpreted imaging which showed no acute hemorrhage, CT  angiogram added at request of neuro hospitalist team.  CT scan of the brain with angiogram and perfusion study did not reveal any significant abnormalities.  Additional history obtained:   Additional history obtained from medical record, no additional history from family as they are not available  Previous records obtained and reviewed no acute findings, no history of coronary disease status post heart cath in May 2019, no stenting, moderate disease, maximizing medical care  Consultations Obtained:   I consulted neurology and discussed lab and imaging findings  Anticipate admission to the hospital for the rest of the stroke work-up.  Neurology agrees this patient is having some intermittent delirium, additional history was obtained from the wife who is now arrived and states that he is not taking his medications correctly, he is keeping him on the kitchen table, they are in a pile, she is not sure that he is taking any of them correctly.  Discussed with hospitalist Dr. Doristine Bosworth who will admit  Reevaluation:  After the interventions stated above, I reevaluated the patient and found stable difficulty with expressive aphasia, no increasing neurologic deficits  Critical Interventions:  . Neuro hospitalist consultation for possible acute stroke and expressive aphasia . CT scan, CT angiogram of the head and the neck and a perfusion study CT scan . Cardiac monitoring, EKG, labs     Final Clinical Impression(s) / ED Diagnoses Final diagnoses:  Delirium      Noemi Chapel, MD 12/11/19 [phone]

## 2019-12-11 NOTE — Consult Note (Signed)
Neurology Consultation  Reason for Consult: Confusion and possible expressive aphasia Referring Physician: Dr. Sabra Heck  CC: Possible expressive aphasia   HPI: Thomas [name] is a 74 y.o. male with history hypertension, hyperlipidemia, diabetesHistory is obtained from: Chart, multiple attempts have been made to get in contact his wife however this has been not possible.  Patient presented to hospital with possible passing out spell.  It is not very clear, the patient is at this time the only 1 in the room and appears to be confused and does not know why he is here.  Initially with the ED doctor apparently he would repeat himself and state "I passed out".  At this time when I am talking to the patient he continues to ask "why am I here I just do not understand" however other times is able to talk lucidly and tell him as he wants to know where his wife is.  We were able to get in touch with the wife.  She states that for the past week he has been confused and the confusion has become worse.  She also states has become more agitated over small things.  At one point during the week he noted that the backyard was having flooding, the next thing the family notices he was out in the backyard digging up dirt.  This is very unlike him.  She also notices that he has been getting his pills mixed up, in fact he has been taking 1 small pill and piling it up on the table and she believes he is not taking it but does not know which pill this is.  He is on oxycodone to be taken 5-325 5 times a day but states that he does not take it that much and is very specific about how much she takes.  What concerned her today, was the fact that the patient got up, did not put on any underwear but did put on to pair pants and did not realize it.  In addition he has been more more confused and also more agitated.  ED course  Relevant labs include -glucose of 388 CT head shows-mild diffuse atrophy with mild periventricular small  vessel disease but no acute infarct  Chart review-no neurological notes and no care everywhere notes  LKW: Unknown tpa given?: no, no known last well Premorbid modified Rankin scale (mRS): 0 NIH stroke scale 2  Past Medical History:  Diagnosis Date  . Actinic keratosis 05/12/2017  . Back pain   . Depression 05/12/2017  . Diabetes ([place])   . GERD (gastroesophageal reflux disease) 05/12/2017  . Hx of colonic polyps 05/12/2017  . Hyperlipidemia 05/12/2017  . Hypertension   . 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 Low back pain 05/12/2017  . Lumbar radiculopathy 05/12/2017  . 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 Obesity 05/12/2017  . Osteoarthritis of knee 05/12/2017  . Paresthesia 05/12/2017  . 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 Prostate nodule 05/12/2017  . Seborrheic dermatitis 05/12/2017    Family History  Problem Relation Age of Onset  . Hypertension Mother   . Hypertension Father   . Alzheimer's disease Father    Social History:   reports that he has been smoking. He has a 55.00 pack-year smoking history. He has never used smokeless tobacco. He reports that he does not drink alcohol or use drugs.  Medications  Current Facility-Administered Medications:  .  sodium chloride flush (NS) 0.9 % injection 3 mL, 3 mL, Intravenous, Once, Noemi Chapel, MD  Current  Outpatient Medications:  .  aspirin 81 MG chewable tablet, Chew 81 mg by mouth daily., Disp: , Rfl:  .  atorvastatin (LIPITOR) 80 MG tablet, Take 80 mg by mouth daily., Disp: , Rfl:  .  DULoxetine (CYMBALTA) 20 MG capsule, Take 20 mg by mouth daily., Disp: , Rfl:  .  EPINEPHrine 0.3 mg/0.3 mL IJ SOAJ injection, Inject 0.3 mg into the muscle daily as needed (for anaphylaxis). , Disp: , Rfl:  .  glipiZIDE (GLUCOTROL) 5 MG tablet, Take 2.5 mg by mouth 2 (two) times daily. , Disp: , Rfl:  .  loratadine (CLARITIN) 10 MG tablet, Take 10 mg by mouth daily., Disp: , Rfl:  .  losartan (COZAAR) 25 MG tablet, Take 25 mg by mouth daily., Disp: , Rfl:  .   meloxicam (MOBIC) 15 MG tablet, Take 15 mg by mouth daily. Half a tablet twice daily, Disp: , Rfl:  .  metFORMIN (GLUCOPHAGE) 1000 MG tablet, Take 1,000 mg by mouth 2 (two) times daily. , Disp: , Rfl:  .  methadone (DOLOPHINE) 5 MG tablet, Take 5 mg by mouth daily., Disp: , Rfl:  .  nitroGLYCERIN (NITROSTAT) 0.4 MG SL tablet, Place 1 tablet (0.4 mg total) under the tongue every 5 (five) minutes as needed for chest pain., Disp: 90 tablet, Rfl: 3 .  oxyCODONE-acetaminophen (PERCOCET/ROXICET) 5-325 MG tablet, Take 1 tablet by mouth 5 (five) times daily as needed for severe pain. , Disp: , Rfl:  .  terazosin (HYTRIN) 2 MG capsule, Take 2 mg by mouth at bedtime., Disp: , Rfl:   ROS:   General ROS: negative for -per wife confusion Psychological ROS: negative for - behavioral disorder, hallucinations, memory difficulties, mood swings or suicidal ideation Ophthalmic ROS: negative for - blurry vision, double vision, eye pain or loss of vision ENT ROS: negative for - epistaxis, nasal discharge, oral lesions, sore throat, tinnitus or vertigo Respiratory ROS: negative for - cough, hemoptysis, shortness of breath or wheezing Cardiovascular ROS: negative for - chest pain, dyspnea on exertion, edema or irregular heartbeat Gastrointestinal ROS: negative for - abdominal pain, diarrhea, hematemesis, nausea/vomiting or stool incontinence Genito-Urinary ROS: negative for - dysuria, hematuria, incontinence or urinary frequency/urgency Musculoskeletal ROS: negative for - joint swelling or muscular weakness Neurological ROS: as noted in HPI Dermatological ROS: negative for rash and skin lesion changes  Exam: Current vital signs: BP (!) 173/88   Pulse 63   Temp 97.9 F (36.6 C) (Oral)   Resp 10   SpO2 97%  Vital signs in last 24 hours: Temp:  [97.9 F (36.6 C)] 97.9 F (36.6 C) (04/05 1232) Pulse Rate:  [63-72] 63 (04/05 1315) Resp:  [10-18] 10 (04/05 1315) BP: (168-173)/(83-88) 173/88 (04/05 1315) SpO2:   [94 %-97 %] 97 % (04/05 1315)   Constitutional: Appears well-developed and well-nourished.  Eyes: No scleral injection HENT: No OP obstrucion Head: Normocephalic.  Cardiovascular: Normal rate and regular rhythm.  Respiratory: Effort normal, non-labored breathing GI: Soft.  No distension. There is no tenderness.  Skin: WDI Neuro: Mental Status: Patient is awake, alert, oriented to person, place, not month, year, and not situation. Speech-intact naming, repeating, able to follow commands Patient is unable to give a clear and coherent history. Cranial Nerves: II: Visual Fields are full.  III,IV, VI: EOMI without ptosis or diploplia. Pupils equal, round and reactive to light V: Facial sensation is symmetric to temperature VII: Right nasolabial fold decrease VIII: hearing is intact to voice X: Palat elevates symmetrically XI: Shoulder shrug  is symmetric. XII: tongue is midline without atrophy or fasciculations.  Motor: Tone is normal. Bulk is normal. 5/5 strength was present in all four extremities.  With the exception of the left leg secondary to pain Drift is positive on left leg secondary to pain otherwise negative Sensory: Sensation is symmetric to light touch and temperature in the arms and legs. Deep Tendon Reflexes: 2+ and symmetric in the biceps and patellae. Plantars: Toes are downgoing bilaterally.  Cerebellar: FNF intact bilaterally.  Heel-to-shin was intact on the right unable to do on the left secondary to pain  Labs I have reviewed labs in epic and the results pertinent to this consultation are:  CBC    Component Value Date/Time   WBC 10.4 12/11/2019 1245   RBC 5.13 12/11/2019 1245   HGB 15.6 12/11/2019 1253   HCT 46.0 12/11/2019 1253   PLT 266 12/11/2019 1245   MCV 90.6 12/11/2019 1245   MCH 30.8 12/11/2019 1245   MCHC 34.0 12/11/2019 1245   RDW 12.4 12/11/2019 1245   LYMPHSABS 1.3 12/11/2019 1245   MONOABS 0.6 12/11/2019 1245   EOSABS 0.1 12/11/2019  1245   BASOSABS 0.1 12/11/2019 1245    CMP     Component Value Date/Time   NA 136 12/11/2019 1253   NA 144 09/02/2017 1408   K 4.2 12/11/2019 1253   CL 102 12/11/2019 1253   CO2 23 12/11/2019 1245   GLUCOSE 376 (H) 12/11/2019 1253   BUN 25 (H) 12/11/2019 1253   BUN 21 09/02/2017 1408   CREATININE 1.00 12/11/2019 1253   CALCIUM 9.2 12/11/2019 1245   PROT 6.1 (L) 12/11/2019 1245   ALBUMIN 3.6 12/11/2019 1245   AST 18 12/11/2019 1245   ALT 23 12/11/2019 1245   ALKPHOS 109 12/11/2019 1245   BILITOT 0.6 12/11/2019 1245   GFRNONAA >60 12/11/2019 1245   GFRAA >60 12/11/2019 1245   Imaging I have reviewed the images obtained:  CT-scan of the brain-mild diffuse atrophy with mild periventricular small vessel disease.  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  Cervical carotid and vertebral artery atherosclerosis without stenosis.  Negative CTP.  Etta Quill PA-C [facility] [phone]  M-F  (9:00 am- 5:00 PM)  12/11/2019, 2:31 PM     Assessment: 74 year old male who was dropped off by his wife with an apparent syncopal/passing out episode.  While ED MD was evaluated patient he noticed patient was repeating himself and possibly had difficulty expressing himself.  On consultation by neurology patient appeared to be able to name objects, express himself, follow commands however at times he would start to perseverate on "why am I here" and "where is my wife".  On neurological exam patient could not tell me the month and also could not hold his left leg up secondary to pain, otherwise nonfocal.  Currently differential includes encephalopathy secondary to medication mix up versus stroke versus possible confusion in the setting of underlying neurocognitive  decline.  Impression: -Confusion -encephlaopathy  Recommendations: --EEG --ammonia, TSH, thiamine, B12 --MRI brain  NEUROHOSPITALIST ADDENDUM Performed a face to face diagnostic evaluation.   I have reviewed the contents of history and physical exam as documented by PA/ARNP/Resident and agree with above documentation.  I have discussed and formulated the above plan as documented. Edits to the note have been made as needed.   74 year old male with past medical  history of hypertension, hyperlipidemia, diabetes presents to the ED with 3-day history of progressive worsening of his mental status.  He has been having increased falls.  Today his symptoms are significantly worse and she decided to bring him to the emergency department.  Symptoms became severe today and patient not making sense and so brought to the emergency department.  There is a question the patient may be taking more narcotics/however patient has never abuses meds before.  There is also concern for gradual worsening of memory and possible dementia.  Patient underwent stat CT head.  Due to symptom of aphasia, CTA of the head and neck was performed which was negative for LVO and CT perfusion was unremarkable.   Patient is afebrile/no neck rigidity.  Moves all 4 extremities equally, cranial nerves are intact.  Follows commands intermittently, not oriented to time or place.  Repetition is intact.    Impression Toxic metabolic encephalopathy Other considerations include seizure with postictal state/stroke-less likely.  Recommendations MRI brain-patient did not cooperate, reattempt tomorrow EEG Hold narcotics Infectious work-up with chest x-ray/UA/COVID-19 testing  Neurology will continue to follow   Karena Addison Aroor MD [facility] [phone]   If 7pm to 7am, please call on call as listed on AMION.

## 2019-12-11 NOTE — Progress Notes (Signed)
Pt went to MRI- will do routine EEG tomorrow as schedule permits.

## 2019-12-11 NOTE — H&P (Signed)
History and Physical    Thomas [name] JGG:[id number] DOB: Mar 24, 1946 DOA: 12/11/2019  PCP: [name], MD  Patient coming from: Home  I have personally briefly reviewed patient's old medical records in [place]  Chief Complaint: Confusion  HPI: Thomas [name] is a 74 y.o. male with medical history significant of hypertension, hyperlipidemia, diabetes, GERD, depression, obesity, osteoarthritis presents to emergency department due to confusion and passing out spells.  Patient's wife at bedside is the historian.  She tells me that patient has recurrent passing out spells and confusion since 1 week however this morning she noticed worsening of his confusion and agitation so she brought him here to the emergency department for further evaluation and management.  Wife reports that patient has been doing unusual things which is very unlike him for example he has been getting his pills mixed up lately, put on his pants but not his underwear and he did not realize it.  Has history of arthritis in left knee.  He received IV steroid shots last week.  Takes Percocet as needed for pain control at home.  No history of headache, blurry vision, chest pain, shortness of breath, palpitation, leg swelling, fever, chills, nausea, vomiting, diarrhea, seizures, loss of consciousness, head trauma, slurred speech or facial droop.  He has chronic cough due to current smoking.  No previous history of TIA or stroke.  He lives with his wife at home.  Smokes 1 pack of cigarettes per day.  No history of alcohol or illicit drug use.  Upon arrival to ED: Patient blood pressure elevated, initial labs such as CBC, CMP, PT/INR, ethanol, ammonia, TSH: All came back within normal limits.  UDS positive for opioids.  CT head negative for acute findings.  CT angiogram of head and neck, cerebral perfusion studies shows left ICA and severe right P2 stenosis.  Neurology consulted by EDP.  [facility] hospitalist consulted for  admission for altered mental status.  Review of Systems: As per HPI otherwise negative.    Past Medical History:  Diagnosis Date  . Actinic keratosis 05/12/2017  . Back pain   . Depression 05/12/2017  . Diabetes ([place])   . GERD (gastroesophageal reflux disease) 05/12/2017  . Hx of colonic polyps 05/12/2017  . Hyperlipidemia 05/12/2017  . Hypertension   . 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 Low back pain 05/12/2017  . Lumbar radiculopathy 05/12/2017  . 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 Obesity 05/12/2017  . Osteoarthritis of knee 05/12/2017  . Paresthesia 05/12/2017  . 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 Prostate nodule 05/12/2017  . Seborrheic dermatitis 05/12/2017    Past Surgical History:  Procedure Laterality Date  . HAND SURGERY    . REPLACEMENT TOTAL KNEE Right   . RIGHT/LEFT HEART CATH AND CORONARY ANGIOGRAPHY N/A 01/12/2018   Procedure: RIGHT/LEFT HEART CATH AND CORONARY ANGIOGRAPHY;  Surgeon: Belva Crome, MD;  Location: [facility];  Service: Cardiovascular;  Laterality: N/A;  . SHOULDER SURGERY Right      reports that he has been smoking. He has a 55.00 pack-year smoking history. He has never used smokeless tobacco. He reports that he does not drink alcohol or use drugs.  Allergies  Allergen Reactions  . Wasp Venom Anaphylaxis, Itching and Swelling    Family History  Problem Relation Age of Onset  . Hypertension Mother   . Hypertension Father   . Alzheimer's disease Father     Prior to Admission medications   Medication Sig Start Date End Date Taking?  Authorizing Provider  aspirin 81 MG chewable tablet Chew 81 mg by mouth daily at 12 noon.    Yes [provider]  atorvastatin (LIPITOR) 80 MG tablet Take 40 mg by mouth at bedtime.    Yes [provider]  diclofenac Sodium (VOLTAREN) 1 % GEL Apply 1 application topically 4 (four) times daily as needed (pain).   Yes [provider]  EPINEPHrine 0.3 mg/0.3 mL IJ SOAJ injection Inject 0.3 mg into the  muscle daily as needed (for anaphylaxis).    Yes [provider]  glipiZIDE (GLUCOTROL) 5 MG tablet Take 5-10 mg by mouth.    Yes [provider]  loratadine (CLARITIN) 10 MG tablet Take 10 mg by mouth daily.   Yes [provider]  losartan (COZAAR) 25 MG tablet Take 25 mg by mouth daily. For blood pressure and kidney protection   Yes [provider]  meloxicam (MOBIC) 15 MG tablet Take 7.5 mg by mouth 2 (two) times daily.    Yes [provider]  metFORMIN (GLUCOPHAGE) 1000 MG tablet Take 1,000 mg by mouth.    Yes [provider]  methadone (DOLOPHINE) 5 MG tablet Take 5 mg by mouth daily as needed (pain).    Yes [provider]  nitroGLYCERIN (NITROSTAT) 0.4 MG SL tablet Place 1 tablet (0.4 mg total) under the tongue every 5 (five) minutes as needed for chest pain. 01/11/18 12/11/19 Yes Park Liter, MD  oxyCODONE (OXY IR/ROXICODONE) 5 MG immediate release tablet Take 5 mg by mouth 4 (four) times daily as needed for severe pain (pain).   Yes [provider]  terazosin (HYTRIN) 2 MG capsule Take 2 mg by mouth.    Yes [provider]  DULoxetine (CYMBALTA) 20 MG capsule Take 20 mg by mouth daily.    [provider]    Physical Exam: Vitals:   12/11/19 1615 12/11/19 1711 12/11/19 1715 12/11/19 1718  BP:  (!) 156/91 (!) 136/96   Pulse:   86 71  Resp: _0 Temp:      TempSrc:      SpO2:   93% 96%    Constitutional: Not in acute distress, obese, confused, agitated, alert but not oriented to time place or person.  On room air Eyes: PERRL, lids and conjunctivae normal ENMT: Mucous membranes are moist. Posterior pharynx clear of any exudate or lesions.Normal dentition.  Neck: normal, supple, no masses, no thyromegaly Respiratory: clear to auscultation bilaterally, no wheezing, no crackles. Normal respiratory effort. No accessory muscle use.  Cardiovascular: Regular rate and rhythm, no murmurs /  rubs / gallops. No extremity edema. 2+ pedal pulses. No carotid bruits.  Abdomen: no tenderness, no masses palpated. No hepatosplenomegaly. Bowel sounds positive.  Musculoskeletal: no clubbing / cyanosis. No joint deformity upper and lower extremities. Good ROM, no contractures. Normal muscle tone.  Skin: no rashes, lesions, ulcers. No induration Neurologic: Unable to perform neurological exam as patient is confused and agitated.  Labs on Admission: I have personally reviewed following labs and imaging studies  CBC: Recent Labs  Lab 12/11/19 1245 12/11/19 1253  WBC 10.4  --   NEUTROABS 8.3*  --   HGB 15.8 15.6  HCT 46.5 46.0  MCV 90.6  --   PLT 266  --    Basic Metabolic Panel: Recent Labs  Lab 12/11/19 1245 12/11/19 1253  NA 135 136  K 4.4 4.2  CL 103 102  CO2 23  --   GLUCOSE 388*  376*  BUN 23 25*  CREATININE 1.18 1.00  CALCIUM 9.2  --    GFR: CrCl cannot be calculated (Unknown ideal weight.). Liver Function Tests: Recent Labs  Lab 12/11/19 1245  AST 18  ALT 23  ALKPHOS 109  BILITOT 0.6  PROT 6.1*  ALBUMIN 3.6   No results for input(s): LIPASE, AMYLASE in the last 168 hours. Recent Labs  Lab 12/11/19 1538  AMMONIA 23   Coagulation Profile: Recent Labs  Lab 12/11/19 1245  INR 1.0   Cardiac Enzymes: No results for input(s): CKTOTAL, CKMB, CKMBINDEX, TROPONINI in the last 168 hours. BNP (last 3 results) No results for input(s): PROBNP in the last 8760 hours. HbA1C: No results for input(s): HGBA1C in the last 72 hours. CBG: Recent Labs  Lab 12/11/19 1239  GLUCAP 368*   Lipid Profile: No results for input(s): CHOL, HDL, LDLCALC, TRIG, CHOLHDL, LDLDIRECT in the last 72 hours. Thyroid Function Tests: Recent Labs    12/11/19 1538  TSH 2.575   Anemia Panel: Recent Labs    12/11/19 1538  VITAMINB12 288   Urine analysis: No results found for: COLORURINE, APPEARANCEUR, LABSPEC, PHURINE, GLUCOSEU, HGBUR, BILIRUBINUR, KETONESUR, PROTEINUR,  UROBILINOGEN, NITRITE, LEUKOCYTESUR  Radiological Exams on Admission: CT Angio Head W or Wo Contrast  Result Date: 12/11/2019 CLINICAL DATA:  Imbalance. EXAM: CT ANGIOGRAPHY HEAD AND NECK CT PERFUSION BRAIN TECHNIQUE: Multidetector CT imaging of the head and neck was performed using the standard protocol during bolus administration of intravenous contrast. Multiplanar CT image reconstructions and MIPs were obtained to evaluate the vascular anatomy. Carotid stenosis measurements (when applicable) are obtained utilizing NASCET criteria, using the distal internal carotid diameter as the denominator. Multiphase CT imaging of the brain was performed following IV bolus contrast injection. Subsequent parametric perfusion maps were calculated using RAPID software. CONTRAST:  137m OMNIPAQUE IOHEXOL 350 MG/ML SOLN COMPARISON:  Noncontrast head CT earlier today. No prior angiographic imaging. FINDINGS: CTA NECK FINDINGS Aortic arch: Standard 3 vessel aortic arch with mild atherosclerotic plaque. Widely patent arch vessel origins. Right carotid system: Patent with small volume calcified and soft plaque at the carotid bifurcation. No evidence of stenosis or dissection. Left carotid system: Patent with predominantly soft plaque in the mid common carotid artery and small volume calcified plaque in the carotid bulb. No evidence of significant stenosis or dissection. Vertebral arteries: Patent with the left being mildly to moderately dominant. Calcified plaque at the origins of both vertebral arteries without evidence of significant stenosis or dissection. Skeleton: Moderate cervical disc and facet degeneration. Other neck: No evidence of cervical lymphadenopathy or mass. Upper chest: Clear lung apices. Review of the MIP images confirms the above findings CTA HEAD FINDINGS Anterior circulation: The internal carotid arteries are patent from skull base to carotid termini with atherosclerotic plaque resulting in moderate stenosis  near the left cavernous-petrous junction and in the left cavernous segment. ACAs and MCAs are patent without evidence of proximal branch occlusion or significant A1 or M1 stenosis. Branch vessel evaluation is limited by early contrast timing. No aneurysm is identified. Posterior circulation: The intracranial vertebral arteries are widely patent to the basilar. The left PICA and right AICA appear dominant. Patent SCAs are seen bilaterally. The basilar artery is widely patent. There is a small left posterior communicating artery. Both PCAs are patent without evidence of significant proximal stenosis on the left. There is a severe mid right P2 stenosis. No aneurysm is identified. Venous sinuses: Not evaluated due to contrast timing. Anatomic variants: None. Review of  the MIP images confirms the above findings CT Brain Perfusion Findings: ASPECTS: Not scored with this history CBF (<30%) Volume: 0 mL Perfusion (Tmax>6.0s) volume: 0 mL Mismatch Volume: 0 mL Infarction Location: None evident by CTP IMPRESSION: 1. No large vessel occlusion. 2. 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 3. Cervical carotid and vertebral artery atherosclerosis without stenosis. 4. Negative CTP. 5. Aortic Atherosclerosis (ICD10-I70.0). Electronically Signed   By: Logan Bores M.D.   On: 12/11/2019 14:58   CT HEAD WO CONTRAST  Result Date: 12/11/2019 CLINICAL DATA:  Ataxia with balance loss EXAM: CT HEAD WITHOUT CONTRAST TECHNIQUE: Contiguous axial images were obtained from the base of the skull through the vertex without intravenous contrast. COMPARISON:  None. FINDINGS: Brain: There is mild diffuse atrophy. There is no intracranial mass, hemorrhage, extra-axial fluid collection, or midline shift. There is mild small vessel disease in the centra semiovale bilaterally. Elsewhere brain parenchyma appears unremarkable. There is no demonstrable acute infarct. Vascular: There is no hyperdense vessel. There  is calcification in each carotid siphon region. Skull: Bony calvarium appears intact. Sinuses/Orbits: There is mucosal thickening in several ethmoid air cells. Other paranasal sinuses are clear. Orbits appear symmetric bilaterally. Other: Mastoid air cells are clear. IMPRESSION: Mild diffuse atrophy with mild periventricular small vessel disease. No evident acute infarct. No mass or hemorrhage. There are foci of arterial vascular calcification. There is mucosal thickening in several ethmoid air cells. Electronically Signed   By: Lowella Grip III M.D.   On: 12/11/2019 13:56   CT Angio Neck W and/or Wo Contrast  Result Date: 12/11/2019 CLINICAL DATA:  Imbalance. EXAM: CT ANGIOGRAPHY HEAD AND NECK CT PERFUSION BRAIN TECHNIQUE: Multidetector CT imaging of the head and neck was performed using the standard protocol during bolus administration of intravenous contrast. Multiplanar CT image reconstructions and MIPs were obtained to evaluate the vascular anatomy. Carotid stenosis measurements (when applicable) are obtained utilizing NASCET criteria, using the distal internal carotid diameter as the denominator. Multiphase CT imaging of the brain was performed following IV bolus contrast injection. Subsequent parametric perfusion maps were calculated using RAPID software. CONTRAST:  121m OMNIPAQUE IOHEXOL 350 MG/ML SOLN COMPARISON:  Noncontrast head CT earlier today. No prior angiographic imaging. FINDINGS: CTA NECK FINDINGS Aortic arch: Standard 3 vessel aortic arch with mild atherosclerotic plaque. Widely patent arch vessel origins. Right carotid system: Patent with small volume calcified and soft plaque at the carotid bifurcation. No evidence of stenosis or dissection. Left carotid system: Patent with predominantly soft plaque in the mid common carotid artery and small volume calcified plaque in the carotid bulb. No evidence of significant stenosis or dissection. Vertebral arteries: Patent with the left being  mildly to moderately dominant. Calcified plaque at the origins of both vertebral arteries without evidence of significant stenosis or dissection. Skeleton: Moderate cervical disc and facet degeneration. Other neck: No evidence of cervical lymphadenopathy or mass. Upper chest: Clear lung apices. Review of the MIP images confirms the above findings CTA HEAD FINDINGS Anterior circulation: The internal carotid arteries are patent from skull base to carotid termini with atherosclerotic plaque resulting in moderate stenosis near the left cavernous-petrous junction and in the left cavernous segment. ACAs and MCAs are patent without evidence of proximal branch occlusion or significant A1 or M1 stenosis. Branch vessel evaluation is limited by early contrast timing. No aneurysm is identified. Posterior circulation: The intracranial vertebral arteries are widely patent to the basilar. The left PICA and right AICA appear dominant. Patent SCAs are  seen bilaterally. The basilar artery is widely patent. There is a small left posterior communicating artery. Both PCAs are patent without evidence of significant proximal stenosis on the left. There is a severe mid right P2 stenosis. No aneurysm is identified. Venous sinuses: Not evaluated due to contrast timing. Anatomic variants: None. Review of the MIP images confirms the above findings CT Brain Perfusion Findings: ASPECTS: Not scored with this history CBF (<30%) Volume: 0 mL Perfusion (Tmax>6.0s) volume: 0 mL Mismatch Volume: 0 mL Infarction Location: None evident by CTP IMPRESSION: 1. No large vessel occlusion. 2. 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 3. Cervical carotid and vertebral artery atherosclerosis without stenosis. 4. Negative CTP. 5. Aortic Atherosclerosis (ICD10-I70.0). Electronically Signed   By: Logan Bores M.D.   On: 12/11/2019 14:58   CT CEREBRAL PERFUSION W CM (CHARM STUDY)  Result Date: 12/11/2019 CLINICAL DATA:   Imbalance. EXAM: CT ANGIOGRAPHY HEAD AND NECK CT PERFUSION BRAIN TECHNIQUE: Multidetector CT imaging of the head and neck was performed using the standard protocol during bolus administration of intravenous contrast. Multiplanar CT image reconstructions and MIPs were obtained to evaluate the vascular anatomy. Carotid stenosis measurements (when applicable) are obtained utilizing NASCET criteria, using the distal internal carotid diameter as the denominator. Multiphase CT imaging of the brain was performed following IV bolus contrast injection. Subsequent parametric perfusion maps were calculated using RAPID software. CONTRAST:  127m OMNIPAQUE IOHEXOL 350 MG/ML SOLN COMPARISON:  Noncontrast head CT earlier today. No prior angiographic imaging. FINDINGS: CTA NECK FINDINGS Aortic arch: Standard 3 vessel aortic arch with mild atherosclerotic plaque. Widely patent arch vessel origins. Right carotid system: Patent with small volume calcified and soft plaque at the carotid bifurcation. No evidence of stenosis or dissection. Left carotid system: Patent with predominantly soft plaque in the mid common carotid artery and small volume calcified plaque in the carotid bulb. No evidence of significant stenosis or dissection. Vertebral arteries: Patent with the left being mildly to moderately dominant. Calcified plaque at the origins of both vertebral arteries without evidence of significant stenosis or dissection. Skeleton: Moderate cervical disc and facet degeneration. Other neck: No evidence of cervical lymphadenopathy or mass. Upper chest: Clear lung apices. Review of the MIP images confirms the above findings CTA HEAD FINDINGS Anterior circulation: The internal carotid arteries are patent from skull base to carotid termini with atherosclerotic plaque resulting in moderate stenosis near the left cavernous-petrous junction and in the left cavernous segment. ACAs and MCAs are patent without evidence of proximal branch occlusion  or significant A1 or M1 stenosis. Branch vessel evaluation is limited by early contrast timing. No aneurysm is identified. Posterior circulation: The intracranial vertebral arteries are widely patent to the basilar. The left PICA and right AICA appear dominant. Patent SCAs are seen bilaterally. The basilar artery is widely patent. There is a small left posterior communicating artery. Both PCAs are patent without evidence of significant proximal stenosis on the left. There is a severe mid right P2 stenosis. No aneurysm is identified. Venous sinuses: Not evaluated due to contrast timing. Anatomic variants: None. Review of the MIP images confirms the above findings CT Brain Perfusion Findings: ASPECTS: Not scored with this history CBF (<30%) Volume: 0 mL Perfusion (Tmax>6.0s) volume: 0 mL Mismatch Volume: 0 mL Infarction Location: None evident by CTP IMPRESSION: 1. No large vessel occlusion. 2. 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 3. Cervical carotid and vertebral artery atherosclerosis without stenosis. 4. Negative CTP. 5. Aortic  Atherosclerosis (ICD10-I70.0). Electronically Signed   By: Logan Bores M.D.   On: 12/11/2019 14:58    EKG: Independently reviewed.  Sinus rhythm.  No new ST elevation or depression noted.  Assessment/Plan Principal Problem:   Acute metabolic encephalopathy Active Problems:   GERD (gastroesophageal reflux disease)   Depression   Type 2 diabetes mellitus ([place])   Hyperlipidemia   Hypertension   Tobacco abuse    Acute metabolic encephalopathy: -Along with passing out episodes.  Unknown etiology?  Could be polypharmacy? (Patient takes Cymbalta, Percocet at home) -Reviewed CT head, CT angiogram of head and neck, cerebral perfusion study. -Initial work-up including CBC, CMP, PT/INR, ammonia level, TSH, ethanol, B12 level: Within normal limits. -UDS positive for opioids.  COVID-19 and B1 level: Pending -MRI brain was ordered-could  not be performed due to patient agitated behavior -Neurology has been consulted-appreciate help recommend EEG -Admit patient to stepdown unit for close monitoring.  On telemetry -Hold opioids and Cymbalta.  Check UA, RPR -We will keep him n.p.o.  Neurochecks every 4 hour -Consulted PT/ST/OT -On fall precaution/seizure precaution/aspiration precaution  Diabetes mellitus: Check A1c -Hold home meds Metformin, glipizide for now -Started on sliding scale insulin and monitor blood sugar closely  Hypertension: Elevated upon arrival -Hold losartan.  Labetalol as needed -Monitor blood pressure closely  Coronary artery disease: Stable.  No ACS symptoms -Hold aspirin, statin and losartan for now. -Reviewed EKG.  Hyperlipidemia: Hold statin  Depression: Hold Cymbalta  Tobacco abuse: Needs counseling regarding cessation  Arthritis of left knee: Hold opioids for now -Received a steroid shot last week  Unable to safely start patient's home meds as med reconciliation is pending by pharmacy.  DVT prophylaxis: Lovenox/SCD/TED Code Status: Full code-confirmed with patient's wife  family Communication: Patient's wife present at bedside.  Plan of care discussed with patient's wife in length and she verbalized understanding and agreed with it. Disposition Plan: To be determined Consults called: Neurology by EDP Admission status: Inpatient   Mckinley Jewel MD [facility] Pager [phone]  If 7PM-7AM, please contact night-coverage [email or web address] Password [facility]  12/11/2019, 5:52 PM

## 2019-12-11 NOTE — Progress Notes (Signed)
Patient came down to MR and was not able to answer any of my safety questions. Got a hold of the wife and the wife cleared patient for the exam. Once patient got on the MR table he started being combative and wanting to get off the MR table. Patient was not able to lay flat and follow instructions. RN made aware.

## 2019-12-11 NOTE — Progress Notes (Signed)
Patient admitted to 3W room 31 with confusion and agitation. Patient urinated on the bed, got out the bed and urinated on the floor. He ripped his tele of an took his gown off. He refused to be touched and the wife is at bedside talked him through and he finally sat in the chair. Bed was changed and gown was changed. Will continue to monitor patient.

## 2019-12-12 ENCOUNTER — Inpatient Hospital Stay (HOSPITAL_COMMUNITY): Payer: Medicare Other

## 2019-12-12 DIAGNOSIS — I10 Essential (primary) hypertension: Secondary | ICD-10-CM

## 2019-12-12 DIAGNOSIS — E119 Type 2 diabetes mellitus without complications: Secondary | ICD-10-CM

## 2019-12-12 DIAGNOSIS — R9401 Abnormal electroencephalogram [EEG]: Secondary | ICD-10-CM

## 2019-12-12 DIAGNOSIS — R4182 Altered mental status, unspecified: Secondary | ICD-10-CM

## 2019-12-12 LAB — COMPREHENSIVE METABOLIC PANEL
ALT: 20 U/L (ref 0–44)
AST: 18 U/L (ref 15–41)
Albumin: 3.3 g/dL — ABNORMAL LOW (ref 3.5–5.0)
Alkaline Phosphatase: 103 U/L (ref 38–126)
Anion gap: 14 (ref 5–15)
BUN: 17 mg/dL (ref 8–23)
CO2: 24 mmol/L (ref 22–32)
Calcium: 9.3 mg/dL (ref 8.9–10.3)
Chloride: 102 mmol/L (ref 98–111)
Creatinine, Ser: 1.01 mg/dL (ref 0.61–1.24)
GFR calc Af Amer: >60 mL/min (ref >60)
GFR calc non Af Amer: >60 mL/min (ref >60)
Glucose, Bld: 256 mg/dL — ABNORMAL HIGH (ref 70–99)
Potassium: 3.6 mmol/L (ref 3.5–5.1)
Sodium: 140 mmol/L (ref 135–145)
Total Bilirubin: 0.7 mg/dL (ref 0.3–1.2)
Total Protein: 5.7 g/dL — ABNORMAL LOW (ref 6.5–8.1)

## 2019-12-12 LAB — GLUCOSE, CAPILLARY
Glucose-Capillary: 267 mg/dL — ABNORMAL HIGH (ref 70–99)
Glucose-Capillary: 200 mg/dL — ABNORMAL HIGH (ref 70–99)
Glucose-Capillary: 190 mg/dL — ABNORMAL HIGH (ref 70–99)
Glucose-Capillary: 157 mg/dL — ABNORMAL HIGH (ref 70–99)

## 2019-12-12 LAB — RPR: RPR Ser Ql: NONREACTIVE

## 2019-12-12 LAB — CBC
HCT: 46.2 % (ref 39.0–52.0)
Hemoglobin: 16 g/dL (ref 13.0–17.0)
MCH: 31.3 pg (ref 26.0–34.0)
MCHC: 34.6 g/dL (ref 30.0–36.0)
MCV: 90.4 fL (ref 80.0–100.0)
Platelets: 256 10*3/uL (ref 150–400)
RBC: 5.11 MIL/uL (ref 4.22–5.81)
RDW: 12.5 % (ref 11.5–15.5)
WBC: 10.4 10*3/uL (ref 4.0–10.5)
nRBC: 0 % (ref 0.0–0.2)

## 2019-12-12 MED ORDER — LORAZEPAM 2 MG/ML IJ SOLN: 1.0000 mg | Freq: Once | INTRAMUSCULAR | Status: DC | PRN

## 2019-12-12 MED ORDER — DICLOFENAC SODIUM 1 % EX GEL
2.0000 g | Freq: Four times a day (QID) | CUTANEOUS | Status: DC
  Administered 2019-12-12 – 2019-12-14 (×11): 2 g via TOPICAL
  Filled 2019-12-12 (×2): qty 100

## 2019-12-12 MED ORDER — CYANOCOBALAMIN 1000 MCG/ML IJ SOLN
1000.0000 ug | Freq: Every day | INTRAMUSCULAR | Status: DC
  Administered 2019-12-12 – 2019-12-15 (×4): 1000 ug via INTRAMUSCULAR
  Filled 2019-12-12 (×4): qty 1

## 2019-12-12 MED ORDER — HALOPERIDOL LACTATE 5 MG/ML IJ SOLN
2.0000 mg | Freq: Once | INTRAMUSCULAR | Status: AC | PRN
  Administered 2019-12-12: 2 mg via INTRAVENOUS
  Filled 2019-12-12: qty 1

## 2019-12-12 MED ORDER — INSULIN GLARGINE 100 UNIT/ML ~~LOC~~ SOLN
8.0000 [IU] | Freq: Every day | SUBCUTANEOUS | Status: DC
  Administered 2019-12-12 – 2019-12-14 (×3): 8 [IU] via SUBCUTANEOUS
  Filled 2019-12-12 (×4): qty 0.08

## 2019-12-12 NOTE — Procedures (Addendum)
Patient Name: Thomas [name]  MRN: [id number]  Epilepsy Attending: Lora Havens  Referring Physician/Provider: Etta Quill, PA Date: 12/12/2019 Duration: 22.31 minutes  Patient history: 74 year old male with altered mental status.  EEG to evaluate for seizures.  Level of alertness: Awake  AEDs during EEG study: None  Technical aspects: This EEG study was done with scalp electrodes positioned according to the 10-20 International system of electrode placement. Electrical activity was acquired at a sampling rate of _0  and reviewed with a high frequency filter of _1  and a low frequency filter of _2 . EEG data were recorded continuously and digitally stored.   Description: During awake state, no clear posterior dominant rhythm was seen.  EEG showed continuous generalized 6 to 7 Hz theta slowing.  Hyperventilation photic stimulation were not performed.  Of note, EEG was technically difficult due to significant movement artifact  Abnormality -Continuous slow, generalized  IMPRESSION: This technically difficult study suggestive of mild to moderate diffuse encephalopathy, nonspecific to etiology. No seizures or epileptiform discharges were seen throughout the recording.  Julena Barbour Barbra Sarks

## 2019-12-12 NOTE — Progress Notes (Signed)
Inpatient Diabetes Program Recommendations  AACE/ADA: New Consensus Statement on Inpatient Glycemic Control   Target Ranges:  Prepandial:   less than 140 mg/dL      Peak postprandial:   less than 180 mg/dL (1-2 hours)      Critically ill patients:  140 - 180 mg/dL   Results for CEPHAS, REVARD" (MRN [id number]) as of 12/12/2019 11:19  Ref. Range 12/11/2019 12:39 12/11/2019 19:45 12/12/2019 06:20  Glucose-Capillary Latest Ref Range: 70 - 99 mg/dL 368 (H) 240 (H) 267 (H)  Results for HARLOW, CARRIZALES" (MRN [id number]) as of 12/12/2019 11:19  Ref. Range 12/11/2019 19:21  Hemoglobin A1C Latest Ref Range: 4.8 - 5.6 % 8.7 (H)   Review of Glycemic Control  Diabetes history: DM2 Outpatient Diabetes medications: Glipizide 2.5 mg BID, Metformin 1000 mg BID Current orders for Inpatient glycemic control: Novolog 0-15 units TID with meals, Novolog 0-5 units QHS  Inpatient Diabetes Program Recommendations:   Insulin - Basal: Please consider ordering Lantus 10 units Q24H.  HgbA1C: A1C 8.7% on 12/11/19 indicating an average glucose of 203 mg/dl over the past 2-3 months. Per H&P, patient received an IV steroid injection last week which is contributing to elevated A1C.  Thanks, Barnie Alderman, RN, MSN, CDE Diabetes Coordinator Inpatient Diabetes Program [phone] (Team Pager from 8am to 5pm)

## 2019-12-12 NOTE — Progress Notes (Signed)
EEG complete - results pending 

## 2019-12-12 NOTE — Progress Notes (Signed)
Physical Therapy Evaluation Patient Details Name: Thomas [name] MRN: [id number] DOB: Sep 10, 1945 Today's Date: 12/12/2019   History of Present Illness  Thomas [name] is a 74 y.o. male with medical history significant of hypertension, hyperlipidemia, diabetes, GERD, depression, obesity, osteoarthritis presents to emergency department due to confusion and passing out spells.  Clinical Impression  Pt admitted with/for encephalopathy.  Work up in progress.  Pt still not at baseline mentally or behaviorally, needing min guard in the least for mobility.  Pt currently limited functionally due to the problems listed below.  (see problems list.)  Pt will benefit from PT to maximize function and safety to be able to get home safely with available assist.     Follow Up Recommendations No PT follow up    Equipment Recommendations  None recommended by PT    Recommendations for Other Services       Precautions / Restrictions Precautions Precautions: Fall      Mobility  Bed Mobility               General bed mobility comments: sitting EOB on arrival  Transfers Overall transfer level: Needs assistance   Transfers: Sit to/from Stand Sit to Stand: Min guard         General transfer comment: pt pulled up on the walker instead of pushing up appropriately.  No assist needed  Ambulation/Gait Ambulation/Gait assistance: Min guard Gait Distance (Feet): 180 Feet Assistive device: Rolling walker (2 wheeled) Gait Pattern/deviations: Step-through pattern Gait velocity: slower Gait velocity interpretation: <1.8 ft/sec, indicate of risk for recurrent falls General Gait Details: generally steady with the RW with some wandering and stepping outside the RW during the turns.  Noticeable antalgia on return.  Stairs            Wheelchair Mobility    Modified Rankin (Stroke Patients Only)       Balance Overall balance assessment: Mild deficits observed, not formally tested                                            Pertinent Vitals/Pain Pain Assessment: Faces Faces Pain Scale: Hurts even more Pain Location: L knee and back Pain Descriptors / Indicators: Discomfort;Guarding;Sore Pain Intervention(s): Premedicated before session;Monitored during session    [name] expects to be discharged to:: Private residence Living Arrangements: Spouse/significant other   Type of Home: House Home Access: [place]: [place]: [place];Shower seat Additional Comments: drives/     Prior Function Level of Independence: Independent               Hand Dominance   Dominant Hand: Right    Extremity/Trunk Assessment                Communication   Communication: HOH  Cognition Arousal/Alertness: Awake/alert Behavior During Therapy: Agitated Overall Cognitive Status: Impaired/Different from baseline Area of Impairment: Orientation;Attention;Memory;Following commands;Safety/judgement;Awareness;Problem solving                 Orientation Level: Situation;Time;Place Current Attention Level: Focused;Sustained Memory: Decreased short-term memory Following Commands: Follows one step commands with increased time Safety/Judgement: Decreased awareness of safety;Decreased awareness of deficits Awareness: Intellectual Problem Solving: Slow processing        General Comments General comments (skin integrity, edema, etc.): Wife present and stating definitely not at baseline, but improved  over 1 day ago.    Exercises     Assessment/Plan    PT Assessment    PT Problem List         PT Treatment Interventions      PT Goals (Current goals can be found in the Care Plan section)  Acute Rehab PT Goals Patient Stated Goal: not stated.    Frequency Min 3X/week   Barriers to discharge        Co-evaluation               AM-PAC PT "6 Clicks" Mobility  Outcome  Measure Help needed turning from your back to your side while in a flat bed without using bedrails?: A Little Help needed moving from lying on your back to sitting on the side of a flat bed without using bedrails?: A Little Help needed moving to and from a bed to a chair (including a wheelchair)?: A Little Help needed standing up from a chair using your arms (e.g., wheelchair or bedside chair)?: A Little Help needed to walk in hospital room?: A Little Help needed climbing 3-5 steps with a railing? : A Little 6 Click Score: 18    End of Session   Activity Tolerance: Patient tolerated treatment well;Patient limited by pain Patient left: in bed;with call bell/phone within reach;with family/visitor present;Other (comment)(sitting EOB) Nurse Communication: Mobility status PT Visit Diagnosis: Unsteadiness on feet (R26.81);Difficulty in walking, not elsewhere classified (R26.2)    Time: 4008-6761 PT Time Calculation (min) (ACUTE ONLY): 31 min   Charges:   PT Evaluation $PT Eval Moderate Complexity: 1 Mod          12/12/2019  [name], PT Acute Rehabilitation Services [phone]  (pager) [phone]  (office)  [name] 12/12/2019, 12:30 PM

## 2019-12-12 NOTE — Plan of Care (Signed)
  Problem: Education: Goal: Knowledge of General Education information will improve Description: Including pain rating scale, medication(s)/side effects and non-pharmacologic comfort measures Outcome: Progressing  Problem: Health Behavior/Discharge Planning: Goal: Ability to manage health-related needs will improve Outcome: Progressing    Spouse updated on patient's condition and plan of care, spouse able to help patient manage toileting and transfer informaiton.  Problem: Clinical Measurements: Goal: Will remain free from infection Outcome: Progressing  Patient afebrile this shift.  Problem: Activity: Goal: Risk for activity intolerance will decrease Outcome: Progressing  Patient up in room and hallway without c/o dyspnea.

## 2019-12-12 NOTE — Progress Notes (Signed)
VAST consulted to obtain IV access as pt pulled his line out and might have a procedure completed tomorrow. Spoke with pt's nurse, Danae Chen via [place] regarding patient's plan of care; discussed reducing risk of infection and vein preservation as this patient does not currently need an IV (no IV meds/fluids). Erica verbalized understanding and agreement. Advised to have next shift place IV team consult in am when known procedure will actually happen.

## 2019-12-12 NOTE — Progress Notes (Addendum)
SLP Cancellation Note  Patient Details Name: Thomas [name] MRN: [id number] DOB: Nov 07, 1945   Cancelled treatment:       Reason Eval/Treat Not Completed: Other (comment) Order received for swallow evaluation although per chart pt has passed the [facility] swallow screen. Per discussion with MD, will defer swallow evaluation for now. Please reorder with any acute concerns. Note that pt is also pending MRI - if this is positive for acute infarct, recommend ordering SLP cognitive-linguistic evaluation.    Osie Bond., M.A. [facility] Pager [phone] Office [phone]  12/12/2019, 8:27 AM
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
## 2019-12-12 NOTE — Progress Notes (Signed)
Pt passed swallow screen at 1754 on 12/11/19.

## 2019-12-12 NOTE — Progress Notes (Signed)
NEUROLOGY PROGRESS NOTE  Subjective: Patient currently in bed in the fetal position.  Does not recall why he is in the hospital but is able to tell me he is in the hospital.  When asking patient questions he gives me a blank stare and then states I do not know.  Patient is complaining of back pain.  Exam: Vitals:   12/11/19 2356 12/12/19 0329  BP: (!) 169/89 (!) 149/89  Pulse: 83 98  Resp: 20 20  Temp: 98.4 F (36.9 C) 98.9 F (37.2 C)  SpO2: 95% 91%   Physical Exam  Constitutional: Appears well-developed and well-nourished.  Eyes: No scleral injection HENT: No OP obstrucion Head: Normocephalic.  Cardiovascular: Normal rate and regular rhythm.  Respiratory: Effort normal, non-labored breathing GI: Soft.  No distension. There is no tenderness.  Skin: WDI Neuro:  Mental Status: Patient is alert and only oriented to hospital.  At times quickly is just states I do not note any question.  Other times will give me a blank stare then state I do not know any question.  He is able to follow simple commands but not complex commands Cranial Nerves: II:  Visual fields grossly normal,  III,IV, VI: ptosis not present, extra-ocular motions intact bilaterally pupils equal, round, reactive to light and accommodation V,VII: smile symmetric, facial light touch sensation normal bilaterally VIII: hearing normal bilaterally XII: midline tongue extension Motor: Right : Upper extremity   5/5    Left:     Upper extremity   5/5  Lower extremity   5/5     left lower extremity still is weak secondary to pain Tone and bulk:normal tone throughout; no atrophy noted Sensory: Pinprick and light touch intact throughout, bilaterally Deep Tendon Reflexes: 2+ and symmetric throughout biceps and patella Plantars: Right: downgoing   Left: downgoing Cerebellar: normal finger-to-nose   Medications:  Scheduled: . diclofenac Sodium  2 g Topical QID  . enoxaparin (LOVENOX) injection  40 mg Subcutaneous Q24H   . insulin aspart  0-15 Units Subcutaneous TID WC  . insulin aspart  0-5 Units Subcutaneous QHS   Continuous:   Pertinent Labs/Diagnostics:  RPR pending A1c 8.7 Thiamine pending B12 low at 288 TSH 2.575 Ammonia within normal limits at 23 UDS positive for opiates however this is on his home med rec Attempted MRI yesterday however patient was unable to stay still EEG has been obtained awaiting final reading   Etta Quill PA-C [facility] [phone]  Assessment:  74 year old male presenting to the ED with 3-day history of progressive worsening of mental status.  Having increased falls.  Due to increased agitation and worsening mental status patient was brought to the emergency department for further evaluation.  Given history of again there is question of mistakes made with his medications.  There is also concern for gradual worsening of memory and possible dementia.  On today's exam he is less interactive.  Patient able to follow simple commands however answers "I do not know "to questions.  It is notable that he does not seem to even attempt to think about how to answer the question.   Impression: - Acute Metabolic Encephalopathy  - Vitamin B12 Deficiency   Recommendations: -EEG which has been obtained however awaiting reading -Reattempt MRI brain -Continue to hold narcotics -UA pending --Injection cyanocobalamin 1000 mcg daily to 5 days  --We will obtain lumbar puncture tomorrow  12/12/2019, 9:15 AM  NEUROHOSPITALIST ADDENDUM Performed a face to face diagnostic evaluation.   I have reviewed the contents  of history and physical exam as documented by PA/ARNP/Resident and agree with above documentation.  I have discussed and formulated the above plan as documented. Edits to the note have been made as needed.  Spoke to the wife in detail, patient very active-drives, has multiple hobbies.  Will occasionally forget small things but otherwise fully independent.   Symptoms most notably since Friday last week.  Were stable Sunday.  She still states he is severely confused, no improvement since yesterday: So unlikely related to narcotics.  EEG unremarkable-shows generalized slowing.  MRI brain limited due to motion artifact however negative for acute stroke.  Patient remains afebrile so HSV encephalitis unlikely-however given his acute unexplained mental status change I do think obtaining a lumbar puncture is needed.  We will check for cell count, protein, CSF culture, glucose.  Patient's vitamin B12 level is borderline low, we will continue to treat with high-dose B12 however I doubt that this alone would explain patient's fairly sudden onset mental status change.  DC Lovenox for today.  Will obtain lumbar puncture tomorrow.  We will hold off starting empiric acyclovir as patient is afebrile.  Please follow-up UA, appears to be still pending.  Karena Addison Ladd Cen MD [facility] [phone]   If 7pm to 7am, please call on call as listed on AMION.

## 2019-12-12 NOTE — Progress Notes (Signed)
PROGRESS NOTE  Thomas [name] KGU:[id number] DOB: June 19, 1946 DOA: 12/11/2019 PCP: [name], MD   LOS: 1 day   Brief Narrative / Interim history: 74 year old male history of HTN, HLD, DM, GERD, depression, obesity, OA who came out to the hospital due to confusion and passing out spells.  Story given by patient's wife who is at bedside.  He has been having recurrent passing out spells and confusion over the past week, worsening in the last day and decided to bring him to the ED.  Patient has been doing unusual things such as mixing his pills up, putting up his pants but not underwear without realizing and increased confusion.  Smokes a pack per day.  He has a history of arthritis in the left knee and received IV steroids likes week, also taking Percocet for pain control as needed.  There is no other reported symptoms, no fever, no chills, no headaches, nausea vomiting.  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  MRI was not able to be obtained due to patient's confusion and not cooperating.  Neurology consulted.  Subjective / 24h Interval events: He is in bed, denies any complaints.  Appears confused.  He tells me this morning that he is in [place], however when asked about his home address states [place].  He does not know why he is in the hospital.  No chest pain, no abdominal pain, no nausea or vomiting  Assessment & Plan: Principal Problem Acute metabolic encephalopathy/syncope -Neurology consulted, appreciate input.  MRI brain could not be performed due to patient's agitated behavior, will reattempt today -EEG recommended by neurology, pending -Hold opioids, Cymbalta -He seems to have persistent symptoms today, not returned to baseline, will continue to closely monitor  Active Problems Type 2 diabetes mellitus -Hold home Metformin, glipizide, placed on sliding scale insulin and continue to  monitor CBGs -Her hemoglobin A1c is 8.7  Essential hypertension -Hold home losartan to allow permissive hypertension in case this represents CVA  Hyperlipidemia -Hold statin  Depression -Hold Cymbalta  Tobacco abuse -Counseled for cessation  Arthritis of the left knee -Received steroid shots last week  Scheduled Meds: . enoxaparin (LOVENOX) injection  40 mg Subcutaneous Q24H  . insulin aspart  0-15 Units Subcutaneous TID WC  . insulin aspart  0-5 Units Subcutaneous QHS   Continuous Infusions: PRN Meds:.acetaminophen **OR** acetaminophen, ondansetron **OR** ondansetron (ZOFRAN) IV  DVT prophylaxis: Lovenox Code Status: Full code Family Communication: Wife not present at bedside, will touch base with her in the afternoon Patient admitted from: Home Anticipated d/c place: To be determined Barriers to d/c: Persistent encephalopathy  Consultants:  Neurology  Procedures:  none  Microbiology  none  Antimicrobials: none    Objective: Vitals:   12/11/19 2100 12/11/19 2200 12/11/19 2356 12/12/19 0329  BP:  (!) 173/91 (!) 169/89 (!) 149/89  Pulse:  79 83 98  Resp: _0 Temp:  97.7 F (36.5 C) 98.4 F (36.9 C) 98.9 F (37.2 C)  TempSrc:  Axillary Oral Oral  SpO2:  94% 95% 91%    Intake/Output Summary (Last 24 hours) at 12/12/2019 6283 Last data filed at 12/11/2019 2358 Gross per 24 hour  Intake --  Output 200 ml  Net -200 ml   There were no vitals filed for this visit.  Examination:  Constitutional: NAD Eyes: no scleral icterus ENMT: Mucous membranes are moist.  Neck: normal,  supple Respiratory: clear to auscultation bilaterally, no wheezing, no crackles.  Cardiovascular: Regular rate and rhythm, no murmurs / rubs / gallops.  Abdomen: non distended, no tenderness. Bowel sounds positive.  Musculoskeletal: no clubbing / cyanosis.  Skin: no rashes Neurologic: CN 2-12 grossly intact. Strength 5/5 in all 4.  Psychiatric: Alert to self only   Data  Reviewed: I have independently reviewed following labs and imaging studies   CBC: Recent Labs  Lab 12/11/19 1245 12/11/19 1253 12/11/19 1921 12/12/19 0340  WBC 10.4  --  10.6* 10.4  NEUTROABS 8.3*  --   --   --   HGB 15.8 15.6 16.3 16.0  HCT 46.5 46.0 47.7 46.2  MCV 90.6  --  90.0 90.4  PLT 266  --  289 950   Basic Metabolic Panel: Recent Labs  Lab 12/11/19 1245 12/11/19 1253 12/11/19 1921 12/12/19 0340  NA 135 136  --  140  K 4.4 4.2  --  3.6  CL 103 102  --  102  CO2 23  --   --  24  GLUCOSE 388* 376*  --  256*  BUN 23 25*  --  17  CREATININE 1.18 1.00 1.03 1.01  CALCIUM 9.2  --   --  9.3  MG  --   --  1.8  --    Liver Function Tests: Recent Labs  Lab 12/11/19 1245 12/12/19 0340  AST 18 18  ALT 23 20  ALKPHOS 109 103  BILITOT 0.6 0.7  PROT 6.1* 5.7*  ALBUMIN 3.6 3.3*   Coagulation Profile: Recent Labs  Lab 12/11/19 1245  INR 1.0   HbA1C: Recent Labs    12/11/19 1921  HGBA1C 8.7*   CBG: Recent Labs  Lab 12/11/19 1239 12/11/19 1945 12/12/19 0620  GLUCAP 368* 240* 267*    Recent Results (from the past 240 hour(s))  SARS CORONAVIRUS 2 (TAT 6-24 HRS) Nasopharyngeal Nasopharyngeal Swab     Status: None   Collection Time: 12/11/19  4:03 PM   Specimen: Nasopharyngeal Swab  Result Value Ref Range Status   SARS Coronavirus 2 NEGATIVE NEGATIVE Final    Comment: (NOTE) SARS-CoV-2 target nucleic acids are NOT DETECTED. The SARS-CoV-2 RNA is generally detectable in upper and lower respiratory specimens during the acute phase of infection. Negative results do not preclude SARS-CoV-2 infection, do not rule out co-infections with other pathogens, and should not be used as the sole basis for treatment or other patient management decisions. Negative results must be combined with clinical observations, patient history, and epidemiological information. The expected result is Negative. Fact Sheet for  Patients: SugarRoll.be Fact Sheet for Healthcare Providers: https://www.woods-mathews.com/ This test is not yet approved or cleared by the [place] FDA and  has been authorized for detection and/or diagnosis of SARS-CoV-2 by FDA under an Emergency Use Authorization (EUA). This EUA will remain  in effect (meaning this test can be used) for the duration of the COVID-19 declaration under Section 56 4(b)(1) of the Act, 21 U.S.C. section 360bbb-3(b)(1), unless the authorization is terminated or revoked sooner. Performed at [facility], [place] [address] [place], [place] [address]      Radiology Studies: CT Angio Head W or Wo Contrast  Result Date: 12/11/2019 CLINICAL DATA:  Imbalance. EXAM: CT ANGIOGRAPHY HEAD AND NECK CT PERFUSION BRAIN TECHNIQUE: Multidetector CT imaging of the head and neck was performed using the standard protocol during bolus administration of intravenous contrast. Multiplanar CT image reconstructions and MIPs were obtained to evaluate  the vascular anatomy. Carotid stenosis measurements (when applicable) are obtained utilizing NASCET criteria, using the distal internal carotid diameter as the denominator. Multiphase CT imaging of the brain was performed following IV bolus contrast injection. Subsequent parametric perfusion maps were calculated using RAPID software. CONTRAST:  115m OMNIPAQUE IOHEXOL 350 MG/ML SOLN COMPARISON:  Noncontrast head CT earlier today. No prior angiographic imaging. FINDINGS: CTA NECK FINDINGS Aortic arch: Standard 3 vessel aortic arch with mild atherosclerotic plaque. Widely patent arch vessel origins. Right carotid system: Patent with small volume calcified and soft plaque at the carotid bifurcation. No evidence of stenosis or dissection. Left carotid system: Patent with predominantly soft plaque in the mid common carotid artery and small volume calcified plaque in the carotid bulb. No evidence of  significant stenosis or dissection. Vertebral arteries: Patent with the left being mildly to moderately dominant. Calcified plaque at the origins of both vertebral arteries without evidence of significant stenosis or dissection. Skeleton: Moderate cervical disc and facet degeneration. Other neck: No evidence of cervical lymphadenopathy or mass. Upper chest: Clear lung apices. Review of the MIP images confirms the above findings CTA HEAD FINDINGS Anterior circulation: The internal carotid arteries are patent from skull base to carotid termini with atherosclerotic plaque resulting in moderate stenosis near the left cavernous-petrous junction and in the left cavernous segment. ACAs and MCAs are patent without evidence of proximal branch occlusion or significant A1 or M1 stenosis. Branch vessel evaluation is limited by early contrast timing. No aneurysm is identified. Posterior circulation: The intracranial vertebral arteries are widely patent to the basilar. The left PICA and right AICA appear dominant. Patent SCAs are seen bilaterally. The basilar artery is widely patent. There is a small left posterior communicating artery. Both PCAs are patent without evidence of significant proximal stenosis on the left. There is a severe mid right P2 stenosis. No aneurysm is identified. Venous sinuses: Not evaluated due to contrast timing. Anatomic variants: None. Review of the MIP images confirms the above findings CT Brain Perfusion Findings: ASPECTS: Not scored with this history CBF (<30%) Volume: 0 mL Perfusion (Tmax>6.0s) volume: 0 mL Mismatch Volume: 0 mL Infarction Location: None evident by CTP IMPRESSION: 1. No large vessel occlusion. 2. 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 3. Cervical carotid and vertebral artery atherosclerosis without stenosis. 4. Negative CTP. 5. Aortic Atherosclerosis (ICD10-I70.0). Electronically Signed   By: ALogan BoresM.D.   On: 12/11/2019 14:58   CT  HEAD WO CONTRAST  Result Date: 12/11/2019 CLINICAL DATA:  Ataxia with balance loss EXAM: CT HEAD WITHOUT CONTRAST TECHNIQUE: Contiguous axial images were obtained from the base of the skull through the vertex without intravenous contrast. COMPARISON:  None. FINDINGS: Brain: There is mild diffuse atrophy. There is no intracranial mass, hemorrhage, extra-axial fluid collection, or midline shift. There is mild small vessel disease in the centra semiovale bilaterally. Elsewhere brain parenchyma appears unremarkable. There is no demonstrable acute infarct. Vascular: There is no hyperdense vessel. There is calcification in each carotid siphon region. Skull: Bony calvarium appears intact. Sinuses/Orbits: There is mucosal thickening in several ethmoid air cells. Other paranasal sinuses are clear. Orbits appear symmetric bilaterally. Other: Mastoid air cells are clear. IMPRESSION: Mild diffuse atrophy with mild periventricular small vessel disease. No evident acute infarct. No mass or hemorrhage. There are foci of arterial vascular calcification. There is mucosal thickening in several ethmoid air cells. Electronically Signed   By: WLowella GripIII M.D.   On: 12/11/2019 13:56   CT  Angio Neck W and/or Wo Contrast  Result Date: 12/11/2019 CLINICAL DATA:  Imbalance. EXAM: CT ANGIOGRAPHY HEAD AND NECK CT PERFUSION BRAIN TECHNIQUE: Multidetector CT imaging of the head and neck was performed using the standard protocol during bolus administration of intravenous contrast. Multiplanar CT image reconstructions and MIPs were obtained to evaluate the vascular anatomy. Carotid stenosis measurements (when applicable) are obtained utilizing NASCET criteria, using the distal internal carotid diameter as the denominator. Multiphase CT imaging of the brain was performed following IV bolus contrast injection. Subsequent parametric perfusion maps were calculated using RAPID software. CONTRAST:  1103m OMNIPAQUE IOHEXOL 350 MG/ML SOLN  COMPARISON:  Noncontrast head CT earlier today. No prior angiographic imaging. FINDINGS: CTA NECK FINDINGS Aortic arch: Standard 3 vessel aortic arch with mild atherosclerotic plaque. Widely patent arch vessel origins. Right carotid system: Patent with small volume calcified and soft plaque at the carotid bifurcation. No evidence of stenosis or dissection. Left carotid system: Patent with predominantly soft plaque in the mid common carotid artery and small volume calcified plaque in the carotid bulb. No evidence of significant stenosis or dissection. Vertebral arteries: Patent with the left being mildly to moderately dominant. Calcified plaque at the origins of both vertebral arteries without evidence of significant stenosis or dissection. Skeleton: Moderate cervical disc and facet degeneration. Other neck: No evidence of cervical lymphadenopathy or mass. Upper chest: Clear lung apices. Review of the MIP images confirms the above findings CTA HEAD FINDINGS Anterior circulation: The internal carotid arteries are patent from skull base to carotid termini with atherosclerotic plaque resulting in moderate stenosis near the left cavernous-petrous junction and in the left cavernous segment. ACAs and MCAs are patent without evidence of proximal branch occlusion or significant A1 or M1 stenosis. Branch vessel evaluation is limited by early contrast timing. No aneurysm is identified. Posterior circulation: The intracranial vertebral arteries are widely patent to the basilar. The left PICA and right AICA appear dominant. Patent SCAs are seen bilaterally. The basilar artery is widely patent. There is a small left posterior communicating artery. Both PCAs are patent without evidence of significant proximal stenosis on the left. There is a severe mid right P2 stenosis. No aneurysm is identified. Venous sinuses: Not evaluated due to contrast timing. Anatomic variants: None. Review of the MIP images confirms the above findings CT  Brain Perfusion Findings: ASPECTS: Not scored with this history CBF (<30%) Volume: 0 mL Perfusion (Tmax>6.0s) volume: 0 mL Mismatch Volume: 0 mL Infarction Location: None evident by CTP IMPRESSION: 1. No large vessel occlusion. 2. 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 3. Cervical carotid and vertebral artery atherosclerosis without stenosis. 4. Negative CTP. 5. Aortic Atherosclerosis (ICD10-I70.0). Electronically Signed   By: ALogan BoresM.D.   On: 12/11/2019 14:58   CT CEREBRAL PERFUSION W CM (CHARM STUDY)  Result Date: 12/11/2019 CLINICAL DATA:  Imbalance. EXAM: CT ANGIOGRAPHY HEAD AND NECK CT PERFUSION BRAIN TECHNIQUE: Multidetector CT imaging of the head and neck was performed using the standard protocol during bolus administration of intravenous contrast. Multiplanar CT image reconstructions and MIPs were obtained to evaluate the vascular anatomy. Carotid stenosis measurements (when applicable) are obtained utilizing NASCET criteria, using the distal internal carotid diameter as the denominator. Multiphase CT imaging of the brain was performed following IV bolus contrast injection. Subsequent parametric perfusion maps were calculated using RAPID software. CONTRAST:  1018mOMNIPAQUE IOHEXOL 350 MG/ML SOLN COMPARISON:  Noncontrast head CT earlier today. No prior angiographic imaging. FINDINGS: CTA NECK FINDINGS Aortic arch:  Standard 3 vessel aortic arch with mild atherosclerotic plaque. Widely patent arch vessel origins. Right carotid system: Patent with small volume calcified and soft plaque at the carotid bifurcation. No evidence of stenosis or dissection. Left carotid system: Patent with predominantly soft plaque in the mid common carotid artery and small volume calcified plaque in the carotid bulb. No evidence of significant stenosis or dissection. Vertebral arteries: Patent with the left being mildly to moderately dominant. Calcified plaque at the origins of  both vertebral arteries without evidence of significant stenosis or dissection. Skeleton: Moderate cervical disc and facet degeneration. Other neck: No evidence of cervical lymphadenopathy or mass. Upper chest: Clear lung apices. Review of the MIP images confirms the above findings CTA HEAD FINDINGS Anterior circulation: The internal carotid arteries are patent from skull base to carotid termini with atherosclerotic plaque resulting in moderate stenosis near the left cavernous-petrous junction and in the left cavernous segment. ACAs and MCAs are patent without evidence of proximal branch occlusion or significant A1 or M1 stenosis. Branch vessel evaluation is limited by early contrast timing. No aneurysm is identified. Posterior circulation: The intracranial vertebral arteries are widely patent to the basilar. The left PICA and right AICA appear dominant. Patent SCAs are seen bilaterally. The basilar artery is widely patent. There is a small left posterior communicating artery. Both PCAs are patent without evidence of significant proximal stenosis on the left. There is a severe mid right P2 stenosis. No aneurysm is identified. Venous sinuses: Not evaluated due to contrast timing. Anatomic variants: None. Review of the MIP images confirms the above findings CT Brain Perfusion Findings: ASPECTS: Not scored with this history CBF (<30%) Volume: 0 mL Perfusion (Tmax>6.0s) volume: 0 mL Mismatch Volume: 0 mL Infarction Location: None evident by CTP IMPRESSION: 1. No large vessel occlusion. 2. 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 3. Cervical carotid and vertebral artery atherosclerosis without stenosis. 4. Negative CTP. 5. Aortic Atherosclerosis (ICD10-I70.0). Electronically Signed   By: Logan Bores M.D.   On: 12/11/2019 14:58   Marzetta Board, MD, PhD [facility]  Between 7 am - 7 pm I am available, please contact me via Amion or Securechat  Between 7 pm - 7 am I  am not available, please contact night coverage MD/APP via Amion

## 2019-12-13 ENCOUNTER — Inpatient Hospital Stay (HOSPITAL_COMMUNITY): Payer: Medicare Other

## 2019-12-13 DIAGNOSIS — R410 Disorientation, unspecified: Secondary | ICD-10-CM

## 2019-12-13 LAB — COMPREHENSIVE METABOLIC PANEL
ALT: 21 U/L (ref 0–44)
AST: 27 U/L (ref 15–41)
Albumin: 3.4 g/dL — ABNORMAL LOW (ref 3.5–5.0)
Alkaline Phosphatase: 104 U/L (ref 38–126)
Anion gap: 14 (ref 5–15)
BUN: 17 mg/dL (ref 8–23)
CO2: 24 mmol/L (ref 22–32)
Calcium: 9.3 mg/dL (ref 8.9–10.3)
Chloride: 103 mmol/L (ref 98–111)
Creatinine, Ser: 1.08 mg/dL (ref 0.61–1.24)
GFR calc Af Amer: >60 mL/min (ref >60)
GFR calc non Af Amer: >60 mL/min (ref >60)
Glucose, Bld: 220 mg/dL — ABNORMAL HIGH (ref 70–99)
Potassium: 3.5 mmol/L (ref 3.5–5.1)
Sodium: 141 mmol/L (ref 135–145)
Total Bilirubin: 1 mg/dL (ref 0.3–1.2)
Total Protein: 5.8 g/dL — ABNORMAL LOW (ref 6.5–8.1)

## 2019-12-13 LAB — GLUCOSE, CAPILLARY
Glucose-Capillary: 252 mg/dL — ABNORMAL HIGH (ref 70–99)
Glucose-Capillary: 361 mg/dL — ABNORMAL HIGH (ref 70–99)
Glucose-Capillary: 135 mg/dL — ABNORMAL HIGH (ref 70–99)
Glucose-Capillary: 272 mg/dL — ABNORMAL HIGH (ref 70–99)

## 2019-12-13 LAB — CBC
HCT: 51.2 % (ref 39.0–52.0)
Hemoglobin: 17.4 g/dL — ABNORMAL HIGH (ref 13.0–17.0)
MCH: 30.7 pg (ref 26.0–34.0)
MCHC: 34 g/dL (ref 30.0–36.0)
MCV: 90.5 fL (ref 80.0–100.0)
Platelets: 256 10*3/uL (ref 150–400)
RBC: 5.66 MIL/uL (ref 4.22–5.81)
RDW: 12.4 % (ref 11.5–15.5)
WBC: 10.6 10*3/uL — ABNORMAL HIGH (ref 4.0–10.5)
nRBC: 0 % (ref 0.0–0.2)

## 2019-12-13 MED ORDER — LIDOCAINE HCL (PF) 1 % IJ SOLN
5.0000 mL | Freq: Once | INTRAMUSCULAR | Status: AC
  Administered 2019-12-13: 10 mL via INTRADERMAL

## 2019-12-13 MED ORDER — LORAZEPAM 2 MG/ML IJ SOLN: 2.0000 mg | Freq: Once | INTRAMUSCULAR | Status: DC

## 2019-12-13 MED ORDER — HALOPERIDOL LACTATE 5 MG/ML IJ SOLN
2.0000 mg | Freq: Four times a day (QID) | INTRAMUSCULAR | Status: DC | PRN
  Filled 2019-12-13: qty 1

## 2019-12-13 MED ORDER — HYDRALAZINE HCL 25 MG PO TABS
25.0000 mg | ORAL_TABLET | Freq: Once | ORAL | Status: AC
  Administered 2019-12-13: 25 mg via ORAL
  Filled 2019-12-13: qty 1

## 2019-12-13 MED ORDER — LIDOCAINE HCL (PF) 1 % IJ SOLN
INTRAMUSCULAR | Status: AC
  Filled 2019-12-13: qty 5

## 2019-12-13 NOTE — Progress Notes (Signed)
NEUROLOGY PROGRESS NOTE   Subjective: Patient has no complaints at this time.  Sitting up at the edge of the bed.  Exam: Vitals:   12/13/19 0429 12/13/19 0752  BP: (!) 184/100 140/86  Pulse: 90 95  Resp: 18 20  Temp: 97.9 F (36.6 C) 97.8 F (36.6 C)  SpO2: 97% 93%    ROS General ROS: negative for - chills, fatigue, fever, night sweats, weight gain or weight loss Psychological ROS: negative for - behavioral disorder, hallucinations, memory difficulties, mood swings or suicidal ideation Ophthalmic ROS: negative for - blurry vision, double vision, eye pain or loss of vision ENT ROS: negative for - epistaxis, nasal discharge, oral lesions, sore throat, tinnitus or vertigo Allergy and Immunology ROS: negative for - hives or itchy/watery eyes Hematological and Lymphatic ROS: negative for - bleeding problems, bruising or swollen lymph nodes Endocrine ROS: negative for - galactorrhea, hair pattern changes, polydipsia/polyuria or temperature intolerance Respiratory ROS: negative for - cough, hemoptysis, shortness of breath or wheezing Cardiovascular ROS: negative for - chest pain, dyspnea on exertion, edema or irregular heartbeat Gastrointestinal ROS: negative for - abdominal pain, diarrhea, hematemesis, nausea/vomiting or stool incontinence Genito-Urinary ROS: negative for - dysuria, hematuria, incontinence or urinary frequency/urgency Musculoskeletal ROS: Positive for -back pain Neurological ROS: as noted in HPI Dermatological ROS: negative for rash and skin lesion changes  Neuro:  Mental Status: Alert, oriented to hospital and the fact that people have told him he was very confused when he came in. patient more talkative today.  Speech is fluent, no aphasia. Cranial Nerves: II:  Visual fields grossly normal,  III,IV, VI: ptosis not present, extra-ocular motions intact bilaterally pupils equal, round, reactive to light and accommodation V,VII: smile symmetric, facial light touch  sensation normal bilaterally VIII: hearing normal bilaterally IX,X: Palate rises midline XI: bilateral shoulder shrug XII: midline tongue extension Motor: Right : Upper extremity   5/5    Left:     Upper extremity   5/5  Lower extremity   5/5     Lower extremity   4/5-secondary to pain Tone and bulk:normal tone throughout; no atrophy noted Sensory: Pinprick and light touch intact throughout, bilaterally Deep Tendon Reflexes: 2+ and symmetric throughout bicep and patella Plantars: Right: downgoing   Left: downgoing   Medications:  Scheduled: . cyanocobalamin  1,000 mcg Intramuscular Daily  . diclofenac Sodium  2 g Topical QID  . insulin aspart  0-15 Units Subcutaneous TID WC  . insulin aspart  0-5 Units Subcutaneous QHS  . insulin glargine  8 Units Subcutaneous Daily   Continuous:  Pertinent Labs/Diagnostics: No pertinent labs today Urinalysis pending 12/11/2019-PT 13.2, INR 1.0   Etta Quill PA-C [facility] [phone]  Assessment:  74 year old male presenting to the ED with 3-day history of progressive worsening of mental status.  Also having increased falls.  As of yesterday he remains confused with no improvement.  EEG showed generalized slowing.  MRI brain limited due to motion artifact however was negative for stroke. B12 was low and started on treatment.   Patient remains afebrile and improving,  HSV encephalitis is unlikely-however given his acute unexplained mental status change will obtain lumbar puncture to check for cell count, protein, CSF culture, glucose and HSV.  Due to his B12 levels low,  started empiric cyanocobalamin.    Impression: Acute encephalopathy of unknown etiology Vitamin B12 deficiency  Recommendations: Continue to hold narcotics Continue vitamin B12 injections LP to be performed under fluoroscopy, will hold off starting empiric acyclovir due  to patient remained afebrile and slightly better than yesterday  12/13/2019, 9:45  AM  NEUROHOSPITALIST ADDENDUM Performed a face to face diagnostic evaluation.   I have reviewed the contents of history and physical exam as documented by PA/ARNP/Resident and agree with above documentation.  I have discussed and formulated the above plan as documented. Edits to the note have been made as needed.      Karena Addison Jovonne Wilton MD [facility] [phone]   If 7pm to 7am, please call on call as listed on AMION.

## 2019-12-13 NOTE — Progress Notes (Signed)
Physical Therapy Treatment Patient Details Name: Thomas [name] MRN: [id number] DOB: 1946/04/03 Today's Date: 12/13/2019    History of Present Illness Thomas [name] is a 74 y.o. male with medical history significant of hypertension, hyperlipidemia, diabetes, GERD, depression, obesity, osteoarthritis presents to emergency department due to confusion and passing out spells.    PT Comments    Patient seen for mobility progression. Current plan remains appropriate.    Follow Up Recommendations  No PT follow up     Equipment Recommendations  None recommended by PT    Recommendations for Other Services       Precautions / Restrictions Precautions Precautions: Fall Restrictions Weight Bearing Restrictions: No    Mobility  Bed Mobility Overal bed mobility: Independent                Transfers Overall transfer level: Needs assistance   Transfers: Sit to/from Stand Sit to Stand: Supervision            Ambulation/Gait Ambulation/Gait assistance: Supervision Gait Distance (Feet): 300 Feet Assistive device: Straight cane Gait Pattern/deviations: Step-through pattern;Drifts right/left Gait velocity: decreased   General Gait Details: mild drifting with horizontal head turns; no LOB; decreased cadence and distracted easily by environment; pt asking every person in the hallway for a cigarette   Stairs             Wheelchair Mobility    Modified Rankin (Stroke Patients Only)       Balance Overall balance assessment: Mild deficits observed, not formally tested                                          Cognition Arousal/Alertness: Awake/alert Behavior During Therapy: Restless Overall Cognitive Status: Impaired/Different from baseline Area of Impairment: Orientation;Attention;Memory;Following commands;Safety/judgement;Problem solving                 Orientation Level: Situation;Time;Place Current Attention Level:  Sustained Memory: Decreased short-term memory Following Commands: Follows one step commands with increased time Safety/Judgement: Decreased awareness of safety;Decreased awareness of deficits   Problem Solving: Slow processing        Exercises      General Comments        Pertinent Vitals/Pain Pain Assessment: Faces Faces Pain Scale: Hurts little more Pain Location: back Pain Descriptors / Indicators: Discomfort;Guarding;Sore Pain Intervention(s): Repositioned    Home Living                      Prior Function            PT Goals (current goals can now be found in the care plan section) Progress towards PT goals: Progressing toward goals    Frequency    Min 3X/week      PT Plan Current plan remains appropriate    Co-evaluation              AM-PAC PT "6 Clicks" Mobility   Outcome Measure  Help needed turning from your back to your side while in a flat bed without using bedrails?: None Help needed moving from lying on your back to sitting on the side of a flat bed without using bedrails?: None Help needed moving to and from a bed to a chair (including a wheelchair)?: None Help needed standing up from a chair using your arms (e.g., wheelchair or bedside chair)?: None Help needed to walk in hospital room?:  A Little Help needed climbing 3-5 steps with a railing? : A Little 6 Click Score: 22    End of Session   Activity Tolerance: Patient tolerated treatment well Patient left: in bed;with call bell/phone within reach;with bed alarm set Nurse Communication: Mobility status PT Visit Diagnosis: Unsteadiness on feet (R26.81);Difficulty in walking, not elsewhere classified (R26.2)     Time: 2103-1281 PT Time Calculation (min) (ACUTE ONLY): 35 min  Charges:  $Gait Training: 23-37 mins                     Earney Navy, PTA Acute Rehabilitation Services Pager: [phone] Office: [phone]     Darliss Cheney 12/13/2019, 12:19  PM

## 2019-12-13 NOTE — Procedures (Signed)
Indication: Acute encephalopathy of unknown origin  Risks of the procedure were dicussed with the patient and patient's wife including post-LP headache, bleeding, infection, weakness/numbness of legs(radiculopathy), death.  The patient and patient's proxy agreed and written consent was obtained.   The patient was prepped and draped, and using sterile technique a 20 gauge quinke spinal needle was inserted in the L 3/4 & L4/5  space.  Patient has significant amount of osteophytes.  After 4 attempts unable to advance needle into spinal canal.  Spinal tap will have to be done under interventional radiology.  Etta Quill PA-C [facility] [phone]  M-F  (9:00 am- 5:00 PM)  12/13/2019, 10:27 AM

## 2019-12-13 NOTE — Procedures (Signed)
Multiple attempts were made at the L4-5 interspace without success.  The patient was returned to the floor with a band-aid over the site.

## 2019-12-13 NOTE — TOC Initial Note (Signed)
Transition of Care [facility]) - Initial/Assessment Note    Patient Details  Name: Thomas [name] MRN: [id number] Date of Birth: [birth year]  Transition of Care [facility]) CM/SW Contact:    Pollie Friar, RN Phone Number: 12/13/2019, 1:27 PM  Clinical Narrative:                 Recommendations are for therapy at home. CM provided choice and [facility] selected. Britney with [facility] accepted the referral.  Wife denies any issues with home medications or with transportation. TOC following. MD please place orders for [facility] services prior to d/c.   Expected Discharge Plan: [place] Barriers to Discharge: Continued Medical Work up   Patient Goals and CMS Choice   CMS Medicare.gov Compare Post Acute Care list provided to:: Patient Choice offered to / list presented to : Patient, Spouse  Expected Discharge Plan and Services Expected Discharge Plan: [place]   Discharge Planning Services: CM Consult Post Acute Care Choice: [place] arrangements for the past 2 months: Single Family Home                           HH Arranged: PT, OT [facility] Agency: [facility] Date [place]: 12/13/19   Representative spoke with at [place]: [place]  Prior Living Arrangements/Services Living arrangements for the past 2 months: [place] Lives with:: Spouse Patient language and need for interpreter reviewed:: Yes Do you feel safe going back to the place where you live?: Yes      Need for Family Participation in Patient Care: Yes (Comment) Care giver support system in place?: Yes (comment) Current home services: DME(walker, cane, shower seat, 3 n 1) Criminal Activity/Legal Involvement Pertinent to Current Situation/Hospitalization: No - Comment as needed  Activities of Daily Living      Permission Sought/Granted                  Emotional Assessment Appearance:: Appears stated age Attitude/Demeanor/Rapport: Engaged Affect  (typically observed): Accepting Orientation: : Oriented to Self, Oriented to Place   Psych Involvement: No (comment)  Admission diagnosis:  Delirium [T73.2] Acute metabolic encephalopathy [K02.54] Patient Active Problem List   Diagnosis Date Noted  . Tobacco abuse 12/11/2019  . Hypertension   . Acute metabolic encephalopathy   . Coronary artery disease 02/11/2018  . Claudication in peripheral vascular disease ([place]) 02/11/2018  . Atypical chest pain 01/12/2018  . Precordial chest pain 01/07/2018  . Coronary artery calcification 01/07/2018  . CKD (chronic kidney disease) stage 3, GFR 30-59 ml/min 11/25/2017  . Chronic diastolic heart failure ([place]) 05/13/2017  . Aortic stenosis, mild 05/13/2017  . Low back pain 05/12/2017  . GERD (gastroesophageal reflux disease) 05/12/2017  . Prostate nodule 05/12/2017  . Obesity 05/12/2017  . Depression 05/12/2017  . Hx of colonic polyps 05/12/2017  . 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 Type 2 diabetes mellitus ([place]) 05/12/2017  . Hyperlipidemia 05/12/2017  . Hypertensive heart disease with heart failure ([place]) 05/12/2017  . Osteoarthritis of knee 05/12/2017  . Sleep apnea 05/12/2017  . Actinic keratosis 05/12/2017  . 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 Seborrheic dermatitis 05/12/2017  . Paresthesia 05/12/2017   PCP:  [name], MD Pharmacy:   [facility], [place] [address] [place] [address] Phone: [phone] Fax: [phone]     Social Determinants of Health (  SDOH) Interventions    Readmission Risk Interventions No flowsheet data found.

## 2019-12-13 NOTE — Progress Notes (Signed)
PROGRESS NOTE  Thomas [name] LKG:[id number] DOB: 01/24/1946 DOA: 12/11/2019 PCP: [name], MD   LOS: 2 days   Brief Narrative / Interim history: 74 year old male history of HTN, HLD, DM, GERD, depression, obesity, OA who came out to the hospital due to confusion and passing out spells.  Story given by patient's wife who is at bedside.  He has been having recurrent passing out spells and confusion over the past week, worsening in the last day and decided to bring him to the ED.  Patient has been doing unusual things such as mixing his pills up, putting up his pants but not underwear without realizing and increased confusion.  Smokes a pack per day.  He has a history of arthritis in the left knee and received IV steroids likes week, also taking Percocet for pain control as needed.  There is no other reported symptoms, no fever, no chills, no headaches, nausea vomiting.  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  MRI was not able to be obtained due to patient's confusion and not cooperating.  Neurology consulted.  EEG without seizures.  Mental status improved.  Multiple attempts at LP by neurology and IR unsuccessful on 4/8  Subjective / 24h Interval events: Patient interviewed and examined along with spouse at bedside.  Feels better.  Denies specific complaints.  As per spouse, mental status significantly improved compared to yesterday but not completely at baseline.  Assessment & Plan: Principal Problem Acute metabolic encephalopathy/syncope -Neurology consulted, appreciate input.  MRI brain limited due to motion artifact however negative for stroke. -EEG without seizure-like activity but showed generalized slowing -Hold opioids, Cymbalta -B12 low and started on parenteral treatment.  As per neurology, HSV encephalitis felt unlikely however given unexplained acute mental status change on admission,  pursued lumbar puncture but multiple attempts by them and IR unsuccessful. -Significantly improved.  Monitor closely.  Active Problems Type 2 diabetes mellitus -Hold home Metformin, glipizide, placed on sliding scale insulin and continue to monitor CBGs -Her hemoglobin A1c is 8.7.  Mildly uncontrolled and fluctuating here.  Essential hypertension -Hold home losartan to allow permissive hypertension in case this represents CVA.  Consider resuming losartan in a.m.  Hyperlipidemia -Hold statin, consider resuming  Depression -Hold Cymbalta  Tobacco abuse -Counseled for cessation  Arthritis of the left knee -Received steroid shots last week, may be the reason for some of his hyperglycemia  Scheduled Meds: . cyanocobalamin  1,000 mcg Intramuscular Daily  . diclofenac Sodium  2 g Topical QID  . insulin aspart  0-15 Units Subcutaneous TID WC  . insulin aspart  0-5 Units Subcutaneous QHS  . insulin glargine  8 Units Subcutaneous Daily   Continuous Infusions: PRN Meds:.acetaminophen **OR** acetaminophen, haloperidol lactate, ondansetron **OR** ondansetron (ZOFRAN) IV  DVT prophylaxis: Lovenox Code Status: Full code Family Communication: Discussed in detail with patient spouse at bedside, updated care and answered questions. Patient admitted from: Home Anticipated d/c place: Likely DC home, possibly 4/8. Barriers to d/c: Persistent encephalopathy although improving now.  Consultants:  Neurology IR  Procedures:  Attempts at LP unsuccessful  Microbiology  none  Antimicrobials: none    Objective: Vitals:   12/13/19 0429 12/13/19 0752 12/13/19 1107 12/13/19 1528  BP: (!) 184/100 140/86 (!) 136/94 (!) 141/100  Pulse: 90 95 (!) 105 81  Resp: _0 Temp: 97.9 F (36.6 C) 97.8 F (36.6 C) 98.7 F (  37.1 C) 98.2 F (36.8 C)  TempSrc: Oral Oral Oral Oral  SpO2: 97% 93% 97% 97%    Intake/Output Summary (Last 24 hours) at 12/13/2019 1952 Last data filed at 12/13/2019  1724 Gross per 24 hour  Intake 480 ml  Output --  Net 480 ml   There were no vitals filed for this visit.  Examination:  Constitutional: Elderly male, moderately built and nourished lying comfortably propped up in bed without distress Eyes: no scleral icterus ENMT: Mucous membranes are moist.  Neck: normal, supple Respiratory: Clear to auscultation.  No increased work of breathing. Cardiovascular: Regular rate and rhythm, no murmurs / rubs / gallops.  Telemetry personally reviewed: Sinus rhythm. Abdomen: non distended, no tenderness. Bowel sounds positive.  Musculoskeletal: no clubbing / cyanosis.  Skin: no rashes Neurologic: Alert and oriented.  No focal neurological deficits.  At times appears slightly confused and seems to joke it off. Psychiatric: Pleasant and appropriate.   Data Reviewed: I have independently reviewed following labs and imaging studies   CBC: Recent Labs  Lab 12/11/19 1245 12/11/19 1253 12/11/19 1921 12/12/19 0340 12/13/19 0405  WBC 10.4  --  10.6* 10.4 10.6*  NEUTROABS 8.3*  --   --   --   --   HGB 15.8 15.6 16.3 16.0 17.4*  HCT 46.5 46.0 47.7 46.2 51.2  MCV 90.6  --  90.0 90.4 90.5  PLT 266  --  289 256 220   Basic Metabolic Panel: Recent Labs  Lab 12/11/19 1245 12/11/19 1253 12/11/19 1921 12/12/19 0340 12/13/19 0405  NA 135 136  --  140 141  K 4.4 4.2  --  3.6 3.5  CL 103 102  --  102 103  CO2 23  --   --  24 24  GLUCOSE 388* 376*  --  256* 220*  BUN 23 25*  --  17 17  CREATININE 1.18 1.00 1.03 1.01 1.08  CALCIUM 9.2  --   --  9.3 9.3  MG  --   --  1.8  --   --    Liver Function Tests: Recent Labs  Lab 12/11/19 1245 12/12/19 0340 12/13/19 0405  AST _0 ALT _1 ALKPHOS 109 103 104  BILITOT 0.6 0.7 1.0  PROT 6.1* 5.7* 5.8*  ALBUMIN 3.6 3.3* 3.4*   Coagulation Profile: Recent Labs  Lab 12/11/19 1245  INR 1.0   HbA1C: Recent Labs    12/11/19 1921  HGBA1C 8.7*   CBG: Recent Labs  Lab 12/12/19 1629  12/12/19 2126 12/13/19 0622 12/13/19 1106 12/13/19 1559  GLUCAP 190* 157* 252* 361* 135*    Recent Results (from the past 240 hour(s))  SARS CORONAVIRUS 2 (TAT 6-24 HRS) Nasopharyngeal Nasopharyngeal Swab     Status: None   Collection Time: 12/11/19  4:03 PM   Specimen: Nasopharyngeal Swab  Result Value Ref Range Status   SARS Coronavirus 2 NEGATIVE NEGATIVE Final    Comment: (NOTE) SARS-CoV-2 target nucleic acids are NOT DETECTED. The SARS-CoV-2 RNA is generally detectable in upper and lower respiratory specimens during the acute phase of infection. Negative results do not preclude SARS-CoV-2 infection, do not rule out co-infections with other pathogens, and should not be used as the sole basis for treatment or other patient management decisions. Negative results must be combined with clinical observations, patient history, and epidemiological information. The expected result is Negative. Fact Sheet for Patients: SugarRoll.be Fact Sheet for Healthcare Providers: https://www.woods-mathews.com/ This test is not yet  approved or cleared by the [place] and  has been authorized for detection and/or diagnosis of SARS-CoV-2 by FDA under an Emergency Use Authorization (EUA). This EUA will remain  in effect (meaning this test can be used) for the duration of the COVID-19 declaration under Section 56 4(b)(1) of the Act, 21 U.S.C. section 360bbb-3(b)(1), unless the authorization is terminated or revoked sooner. Performed at [facility], [place] [address], [place], [place] [address]      Radiology Studies: DG Fluoro Rm 1-60 Min  Result Date: 12/13/2019 CLINICAL DATA:  Encephalopathy. EXAM: DG C-ARM 1-60 MIN FLUOROSCOPY TIME:  Fluoroscopy Time:  1 minutes 12 seconds Radiation Exposure Index (if provided by the fluoroscopic device): 10.9 mGy Number of Acquired Spot Images: 0 COMPARISON:  CT study from 2016. FINDINGS: The L4-5  interspace was selected with fluoroscopy in the site was marked. After sterile prep and drape attempts were made to access the L4-5 interspace to the right and left of the spinous process without success. Attempts to oblique the patient were unsuccessful as the patient was uncomfortable and had a difficult time remaining still for the procedure. After multiple attempts the procedure was terminated. The patient was returned to the floor in good condition. IMPRESSION: Unsuccessful lumbar puncture as described. Electronically Signed   By: Zetta Bills M.D.   On: 12/13/2019 16:09    Vernell Leep, MD, [place], [facility]. [facility]  To contact the attending provider between 7A-7P or the covering provider during after hours 7P-7A, please log into the web site [email or web address] and access using universal [place] password for that web site. If you do not have the password, please call the hospital operator.

## 2019-12-13 NOTE — Progress Notes (Signed)
Inpatient Diabetes Program Recommendations  AACE/ADA: New Consensus Statement on Inpatient Glycemic Control   Target Ranges:  Prepandial:   less than 140 mg/dL      Peak postprandial:   less than 180 mg/dL (1-2 hours)      Critically ill patients:  140 - 180 mg/dL  Results for BASIM, BARTNIK" (MRN [id number]) as of 12/13/2019 09:37  Ref. Range 12/12/2019 06:20 12/12/2019 11:56 12/12/2019 16:29 12/12/2019 21:26 12/13/2019 06:22  Glucose-Capillary Latest Ref Range: 70 - 99 mg/dL 267 (H) 200 (H) 190 (H) 157 (H) 252 (H)    Review of Glycemic Control  Diabetes history: DM2 Outpatient Diabetes medications: Glipizide 2.5 mg BID, Metformin 1000 mg BID Current orders for Inpatient glycemic control: Lantus 8 units daily, Novolog 0-15 units TID with meals, Novolog 0-5 units QHS  Inpatient Diabetes Program Recommendations:   Insulin - Basal: Please consider increasing Lantus to 11 units daily.  HgbA1C: A1C 8.7% on 12/11/19 indicating an average glucose of 203 mg/dl over the past 2-3 months. Per H&P, patient received an IV steroid injection last week which is contributing to elevated A1C.  Thanks, Barnie Alderman, RN, MSN, CDE Diabetes Coordinator Inpatient Diabetes Program [phone] (Team Pager from 8am to 5pm)

## 2019-12-14 ENCOUNTER — Inpatient Hospital Stay (HOSPITAL_COMMUNITY): Payer: Medicare Other

## 2019-12-14 LAB — GLUCOSE, CAPILLARY
Glucose-Capillary: 280 mg/dL — ABNORMAL HIGH (ref 70–99)
Glucose-Capillary: 306 mg/dL — ABNORMAL HIGH (ref 70–99)
Glucose-Capillary: 216 mg/dL — ABNORMAL HIGH (ref 70–99)
Glucose-Capillary: 248 mg/dL — ABNORMAL HIGH (ref 70–99)

## 2019-12-14 MED ORDER — QUETIAPINE FUMARATE 25 MG PO TABS
12.5000 mg | ORAL_TABLET | Freq: Every day | ORAL | Status: DC
  Administered 2019-12-14: 12.5 mg via ORAL
  Filled 2019-12-14: qty 1

## 2019-12-14 MED ORDER — DIVALPROEX SODIUM ER 250 MG PO TB24
250.0000 mg | ORAL_TABLET | Freq: Two times a day (BID) | ORAL | Status: DC
  Administered 2019-12-14: 250 mg via ORAL
  Filled 2019-12-14 (×3): qty 1

## 2019-12-14 MED ORDER — GADOBUTROL 1 MMOL/ML IV SOLN
8.0000 mL | Freq: Once | INTRAVENOUS | Status: AC | PRN
  Administered 2019-12-14: 18:00:00 8 mL via INTRAVENOUS

## 2019-12-14 MED ORDER — HALOPERIDOL LACTATE 5 MG/ML IJ SOLN: 2.0000 mg | Freq: Once | INTRAMUSCULAR | Status: DC

## 2019-12-14 MED ORDER — HALOPERIDOL 0.5 MG PO TABS
1.0000 mg | ORAL_TABLET | Freq: Once | ORAL | Status: AC
  Administered 2019-12-14: 1 mg via ORAL
  Filled 2019-12-14: qty 2

## 2019-12-14 NOTE — Progress Notes (Addendum)
PROGRESS NOTE  DARRAGH NAY YSA:[id number] DOB: 06-08-1946 DOA: 12/11/2019 PCP: [name], MD   LOS: 3 days   Brief Narrative / Interim history: 74 year old male history of HTN, HLD, DM, GERD, depression, obesity, OA who came out to the hospital due to confusion and passing out spells.  Story given by patient's wife who is at bedside.  He has been having recurrent passing out spells and confusion over the past week, worsening in the last day and decided to bring him to the ED.  Patient has been doing unusual things such as mixing his pills up, putting up his pants but not underwear without realizing and increased confusion.  Smokes a pack per day.  He has a history of arthritis in the left knee and received IV steroids likes week, also taking Percocet for pain control as needed.  There is no other reported symptoms, no fever, no chills, no headaches, nausea vomiting.  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  MRI was not able to be obtained due to patient's confusion and not cooperating.  Neurology consulted.  EEG without seizures.  Mental status fluctuating.  Multiple attempts at LP by neurology and IR unsuccessful on 4/8.  Subjective / 24h Interval events: Ongoing fluctuating mental status.  Overnight patient restless, removed cardiac monitor, leads in gown, wandering the halls wanting to leave the hospital.  This morning seemed aggressive when neurology PA went to see him.  Subsequently during my visit patient was calm, pleasant, oriented x2 and seemed cooperative but again this evening, patient angry, agitated, confused, keeps repeating the same thing again and again.  As per discussion with spouse this evening, patient clearly confused, not at baseline, worse than yesterday.  Assessment & Plan: Principal Problem Acute metabolic encephalopathy/syncope -Neurology consulted, appreciate input.  MRI brain  limited due to motion artifact however negative for stroke.  MRI repeated and without acute findings. -EEG without seizure-like activity but showed generalized slowing -Hold opioids, Cymbalta -B12 low and started on parenteral treatment.  As per neurology, HSV encephalitis felt unlikely however given unexplained acute mental status change on admission, pursued lumbar puncture but multiple attempts by them and IR unsuccessful. -Ongoing waxing and waning mental status.  I discussed extensively with Dr. Lorraine Lax, neurology a couple of times today.  He reassessed patient again this evening.  Patient clearly not close to his baseline.  Current mental status changes leading to this admission reportedly acute in onset of unclear etiology.  We agree that patient currently not capable of making his own medical decisions, unsafe to discharge home in the absence of clear diagnosis for his acute encephalopathy which may even get worse and his spouse may not be able to handle him at home.  We therefore agree that he cannot sign out by himself [place].  Spouse agrees and seems to have convinced patient to stay an additional night.  Neurology starting him on Depakote 250 mg twice daily.  Discussed in detail with patient's RN.  Check urine microscopy stat, ordered but never sent over the last 2 days  Active Problems Type 2 diabetes mellitus -Hold home Metformin, glipizide, placed on sliding scale insulin and continue to monitor CBGs -Her hemoglobin A1c is 8.7.  Uncontrolled and fluctuating as noted below.  Consider increasing Lantus to 13 units in a.m.  Essential hypertension -Hold home losartan to allow permissive hypertension in case this represents CVA.  Consider resuming losartan in a.m.  Hyperlipidemia -Hold statin, consider resuming  Depression -Hold Cymbalta  Tobacco abuse -Counseled for cessation  Arthritis of the left knee -Received steroid shots last week, may be the reason for some of  his hyperglycemia  Scheduled Meds: . cyanocobalamin  1,000 mcg Intramuscular Daily  . diclofenac Sodium  2 g Topical QID  . insulin aspart  0-15 Units Subcutaneous TID WC  . insulin aspart  0-5 Units Subcutaneous QHS  . insulin glargine  8 Units Subcutaneous Daily   Continuous Infusions: PRN Meds:.acetaminophen **OR** acetaminophen, haloperidol lactate, ondansetron **OR** ondansetron (ZOFRAN) IV  DVT prophylaxis: Lovenox Code Status: Full code Family Communication: Discussed in detail with patient spouse again this evening, updated care and answered questions. Patient admitted from: Home Anticipated d/c place: Likely DC home, pending clinical improvement Barriers to d/c: Persistent encephalopathy.  Consultants:  Neurology IR  Procedures:  Attempts at LP unsuccessful  Microbiology  none  Antimicrobials: none    Objective: Vitals:   12/14/19 0355 12/14/19 0728 12/14/19 1144 12/14/19 1614  BP: 139/89 (!) 151/95 134/85 136/89  Pulse: 85 86 86 92  Resp: _0 Temp: 98 F (36.7 C) 98.4 F (36.9 C) 98.2 F (36.8 C) 98.3 F (36.8 C)  TempSrc: Oral Oral Oral Oral  SpO2: 97% 96% 98% 98%    Intake/Output Summary (Last 24 hours) at 12/14/2019 1904 Last data filed at 12/14/2019 0300 Gross per 24 hour  Intake 480 ml  Output --  Net 480 ml   There were no vitals filed for this visit.  Examination:  Constitutional: Elderly male, moderately built and nourished fully dressed seen ambulating comfortably in the room this morning Eyes: no scleral icterus ENMT: Mucous membranes are moist.  Neck: normal, supple Respiratory: Clear to auscultation.  No increased work of breathing. Cardiovascular: Regular rate and rhythm, no murmurs / rubs / gallops.  Telemetry personally reviewed: Sinus rhythm. Abdomen: non distended, no tenderness. Bowel sounds positive.  Musculoskeletal: no clubbing / cyanosis.  Skin: no rashes Neurologic: Alert and oriented x2 this morning.  No focal  neurological deficits.  Ongoing confusion and intermittent agitation Psychiatric: Unable to assess at this time.   Data Reviewed: I have independently reviewed following labs and imaging studies   CBC: Recent Labs  Lab 12/11/19 1245 12/11/19 1253 12/11/19 1921 12/12/19 0340 12/13/19 0405  WBC 10.4  --  10.6* 10.4 10.6*  NEUTROABS 8.3*  --   --   --   --   HGB 15.8 15.6 16.3 16.0 17.4*  HCT 46.5 46.0 47.7 46.2 51.2  MCV 90.6  --  90.0 90.4 90.5  PLT 266  --  289 256 878   Basic Metabolic Panel: Recent Labs  Lab 12/11/19 1245 12/11/19 1253 12/11/19 1921 12/12/19 0340 12/13/19 0405  NA 135 136  --  140 141  K 4.4 4.2  --  3.6 3.5  CL 103 102  --  102 103  CO2 23  --   --  24 24  GLUCOSE 388* 376*  --  256* 220*  BUN 23 25*  --  17 17  CREATININE 1.18 1.00 1.03 1.01 1.08  CALCIUM 9.2  --   --  9.3 9.3  MG  --   --  1.8  --   --    Liver Function Tests: Recent Labs  Lab 12/11/19 1245 12/12/19 0340 12/13/19 0405  AST _1 ALT _2 ALKPHOS 109 103 104  BILITOT 0.6 0.7 1.0  PROT 6.1* 5.7* 5.8*  ALBUMIN 3.6 3.3* 3.4*   Coagulation Profile: Recent Labs  Lab 12/11/19 1245  INR 1.0   HbA1C: Recent Labs    12/11/19 1921  HGBA1C 8.7*   CBG: Recent Labs  Lab 12/13/19 1559 12/13/19 2124 12/14/19 0600 12/14/19 1140 12/14/19 1834  GLUCAP 135* 272* 280* 306* 216*    Recent Results (from the past 240 hour(s))  SARS CORONAVIRUS 2 (TAT 6-24 HRS) Nasopharyngeal Nasopharyngeal Swab     Status: None   Collection Time: 12/11/19  4:03 PM   Specimen: Nasopharyngeal Swab  Result Value Ref Range Status   SARS Coronavirus 2 NEGATIVE NEGATIVE Final    Comment: (NOTE) SARS-CoV-2 target nucleic acids are NOT DETECTED. The SARS-CoV-2 RNA is generally detectable in upper and lower respiratory specimens during the acute phase of infection. Negative results do not preclude SARS-CoV-2 infection, do not rule out co-infections with other pathogens, and should  not be used as the sole basis for treatment or other patient management decisions. Negative results must be combined with clinical observations, patient history, and epidemiological information. The expected result is Negative. Fact Sheet for Patients: SugarRoll.be Fact Sheet for Healthcare Providers: https://www.woods-mathews.com/ This test is not yet approved or cleared by the [place] FDA and  has been authorized for detection and/or diagnosis of SARS-CoV-2 by FDA under an Emergency Use Authorization (EUA). This EUA will remain  in effect (meaning this test can be used) for the duration of the COVID-19 declaration under Section 56 4(b)(1) of the Act, 21 U.S.C. section 360bbb-3(b)(1), unless the authorization is terminated or revoked sooner. Performed at [facility], [place] [address] [place],  [address]      Radiology Studies: MR BRAIN W WO CONTRAST  Result Date: 12/14/2019 CLINICAL DATA:  74 year old male status post code stroke presentation on 12/11/2019. Continued encephalopathy. EXAM: MRI HEAD WITHOUT AND WITH CONTRAST TECHNIQUE: Multiplanar, multiecho pulse sequences of the brain and surrounding structures were obtained without and with intravenous contrast. CONTRAST:  43m GADAVIST GADOBUTROL 1 MMOL/ML IV SOLN COMPARISON:  Limited diffusion only brain MRI 12/12/2019. CTA, CTP and CT head 12/11/2019. FINDINGS: Brain: No restricted diffusion to suggest acute infarction. No midline shift, mass effect, evidence of mass lesion, ventriculomegaly, extra-axial collection or acute intracranial hemorrhage. Cervicomedullary junction and pituitary are within normal limits. No cortical encephalomalacia or chronic cerebral blood products identified. Mild for age nonspecific cerebral white matter T2 and FLAIR hyperintensity, mostly periventricular. The deep gray nuclei, brainstem and cerebellum are within normal limits for age. - No abnormal  enhancement identified. No dural thickening. Vascular: Major intracranial vascular flow voids are preserved. The left vertebral artery appears mildly dominant. The major dural venous sinuses are enhancing and appear to be patent. Skull and upper cervical spine: C3-C4 disc degeneration resulting in mild spinal stenosis on series 5, image 12. Normal visible bone marrow signal. Sinuses/Orbits: Negative orbits. Paranasal sinuses and mastoids are stable and well pneumatized. Other: Trace retained secretions in the nasopharynx. Visible internal auditory structures appear normal. IMPRESSION: No acute intracranial abnormality and largely unremarkable for age MRI appearance of the brain. Electronically Signed   By: HGenevie AnnM.D.   On: 12/14/2019 18:43    AVernell Leep MD, FPer[facility]  To contact the attending provider between 7A-7P or the covering provider during after hours 7P-7A, please log into the web site [email or web address] and access using universal [place] password for that web site. If you do not have  the password, please call the hospital operator.

## 2019-12-14 NOTE — Progress Notes (Signed)
NEUROLOGY PROGRESS NOTE    Subjective: Patient at this point is sitting comfortably.  He will not answer any questions appropriately.  He is trying to be sarcastic but also seems agitated.  Exam: Vitals:   12/14/19 0355 12/14/19 0728  BP: 139/89 (!) 151/95  Pulse: 85 86  Resp: 18 18  Temp: 98 F (36.7 C) 98.4 F (36.9 C)  SpO2: 97% 96%     Physical Exam  Constitutional: Appears well-developed and well-nourished.  Psych: Agitated Eyes: No scleral injection HENT: No OP obstrucion Head: Normocephalic.  Cardiovascular: Normal rate and regular rhythm.  Respiratory: Effort normal, non-labored breathing GI: Soft.  No distension. There is no tenderness.  Skin: WDI Neuro:  Mental Status: Alert, Speech fluent without evidence of aphasia.  Would not follow any commands.  Told practitioner to stay away from him. Cranial Nerves: II:  Visual fields grossly normal,  III,IV, VI: When looking around the room he had intact extraocular movements V,VII: Face symmetric  Motor: Moving all extremities antigravity Sensory: Would not allow me to assess Deep Tendon Reflexes: Would not allow me to assess   Medications:  Scheduled: . cyanocobalamin  1,000 mcg Intramuscular Daily  . diclofenac Sodium  2 g Topical QID  . insulin aspart  0-15 Units Subcutaneous TID WC  . insulin aspart  0-5 Units Subcutaneous QHS  . insulin glargine  8 Units Subcutaneous Daily   Continuous:   Pertinent Labs/Diagnostics:   EEG  Result Date: 12/12/2019 Lora Havens, MD     12/12/2019  9:48 AM Patient Name: Thomas [name] MRN: [id number] Epilepsy Attending: Lora Havens Referring Physician/Provider: Etta Quill, PA Date: 12/12/2019 Duration: 22.31 minutes Patient history: 74 year old male with altered mental status.  EEG to evaluate for seizures. Level of alertness: Awake AEDs during EEG study: None Technical aspects: This EEG study was done with scalp electrodes positioned according to the 10-20  International system of electrode placement. Electrical activity was acquired at a sampling rate of _0  and reviewed with a high frequency filter of _1  and a low frequency filter of _2 . EEG data were recorded continuously and digitally stored. Description: During awake state, no clear posterior dominant rhythm was seen.  EEG showed continuous generalized 6 to 7 Hz theta slowing.  Hyperventilation photic stimulation were not performed. Of note, EEG was technically difficult due to significant movement artifact Abnormality -Continuous slow, generalized IMPRESSION: This technically difficult study suggestive of mild to moderate diffuse encephalopathy, nonspecific to etiology. No seizures or epileptiform discharges were seen throughout the recording. Priyanka Barbra Sarks    DG Fluoro Rm 1-60 Min  Result Date: 12/13/2019 CLINICAL DATA:  Encephalopathy. EXAM: DG C-ARM 1-60 MIN FLUOROSCOPY TIME:  Fluoroscopy Time:  1 minutes 12 seconds Radiation Exposure Index (if provided by the fluoroscopic device): 10.9 mGy Number of Acquired Spot Images: 0 COMPARISON:  CT study from 2016. FINDINGS: The L4-5 interspace was selected with fluoroscopy in the site was marked. After sterile prep and drape attempts were made to access the L4-5 interspace to the right and left of the spinous process without success. Attempts to oblique the patient were unsuccessful as the patient was uncomfortable and had a difficult time remaining still for the procedure. After multiple attempts the procedure was terminated. The patient was returned to the floor in good condition. IMPRESSION: Unsuccessful lumbar puncture as described. Electronically Signed   By: Zetta Bills M.D.   On: 12/13/2019 16:09     Etta Quill PA-C [facility] [phone]  Assessment:  74 year old male presented to the ED with 3-day history of progressive worsening of mental status.  EEG showed generalized slowing.  MRI limited due to motion artifact  however negative for stroke.  B12 was borderline low and started on treatment.  Lumbar puncture was attempted twice and unsuccessful.  Currently on exam today patient is sitting in the chair comfortably, more oriented to his baseline. UA   However intermittently gets agitated and attempting to leave to hospital   Impression: Acute toxic metabolic encephalopathy : hyperglycemia, B12 deficiency, possible opiates/ Behavioral disturbance  Vitamin B12 deficiency Hyperglycemia    Recommendations: - UA still pending  -Continue to hold narcotics --MRI brain with and without contrast ordered : No acute findings -Continue B12 injections and will need B12 supplement as outpatient -- LP attempted twice, attempted with fluoroscopy, will defer further attempts as patient improving,  -Recommend neurocognitive assessment as outpatient --Depakote 250 mg twice daily   12/14/2019, 8:47 AM  NEUROHOSPITALIST ADDENDUM Performed a face to face diagnostic evaluation.   I have reviewed the contents of history and physical exam as documented by PA/ARNP/Resident and agree with above documentation.  I have discussed and formulated the above plan as documented. Edits to the note have been made as needed.  Patient appears to have improved today morning, was answering questions appropriately and alert oriented x3.  Still had some confusion such as difficulty using the phone.  In the evening,  however patient was significantly agitated and walking the hallways, demanding to leave the hospital. Spoke with wife and strongly recommended patient stay and wife was in agreement.  As patient is improving, low suspicion is a viral encephalitis and so I do not think we need to reattempt LP after 2 failed attempts including one with fluoroscopy.  Likely patient's altered mental status toxic metabolic-he is on opiates and Cymbalta at home, also had hyperglycemia.  UA still pending.  B12 also low.  There is also possibility he  may have early cognitive impairment with sudden onset delirium/behavioral disturbance-we recommend cognitive assessment as outpatient.  I did speak to the wife and asked to observe him for 1 more day to see if he continues to make improvement.      Karena Addison  MD [facility] [phone]   If 7pm to 7am, please call on call as listed on AMION.

## 2019-12-14 NOTE — Progress Notes (Signed)
Occupational Therapy Treatment Patient Details Name: Thomas [name] MRN: [id number] DOB: 01/01/1946 Today's Date: 12/14/2019    History of present illness Thomas [name] is a 74 y.o. male with medical history significant of hypertension, hyperlipidemia, diabetes, GERD, depression, obesity, osteoarthritis presents to emergency department due to confusion and passing out spells.   OT comments  Patient continues to make steady progress towards goals in skilled OT session. Patient's session encompassed functional mobility and attempts to complete higher cognitive tasks. Pt willing to go for a walk to get the newspaper, however pt would not answer questions directly, often giving a round about answer making it difficult to assess cognition. Therapist discussed discharge plan with wife, and states that she feels comfortable with him going home at current level, saying that he has been getting restless at nights already and "he does his own thing, then goes back to bed". Continue with discharge recommendation as planned; will continue to follow acutely.    Follow Up Recommendations  Home health OT    Equipment Recommendations  3 in 1 bedside commode    Recommendations for Other Services      Precautions / Restrictions Precautions Precautions: Fall Restrictions Weight Bearing Restrictions: No       Mobility Bed Mobility Overal bed mobility: Independent             General bed mobility comments: Sitting in chair next to wife upon arrival  Transfers Overall transfer level: Modified independent   Transfers: Sit to/from Stand Sit to Stand: Supervision         General transfer comment: pt walked without assist of device in session, occasionally reaching for hand rails when ambulating, no LOB noted    Balance Overall balance assessment: Mild deficits observed, not formally tested                                         ADL either performed or assessed with  clinical judgement   ADL Overall ADL's : Needs assistance/impaired                                       General ADL Comments: Pt not wanting to participate in ADL tasks, and pt fully dressed upon arrival to room. Pt encouraged to participate by going and getting a newspaper, and perseverative on going home and getting a cigarette     Vision Baseline Vision/History: Wears glasses Wears Glasses: At all times     Perception     Praxis      Cognition Arousal/Alertness: Awake/alert Behavior During Therapy: Restless;Impulsive Overall Cognitive Status: Impaired/Different from baseline Area of Impairment: Orientation;Attention;Memory;Following commands;Safety/judgement;Problem solving                 Orientation Level: Situation;Time;Place(Often dodging orientation questions, would change the subject) Current Attention Level: Sustained Memory: Decreased short-term memory Following Commands: Follows one step commands with increased time Safety/Judgement: Decreased awareness of safety;Decreased awareness of deficits Awareness: Intellectual Problem Solving: Slow processing          Exercises     Shoulder Instructions       General Comments      Pertinent Vitals/ Pain       Pain Assessment: No/denies pain  Home Living  Prior Functioning/Environment              Frequency  Min 2X/week        Progress Toward Goals  OT Goals(current goals can now be found in the care plan section)  Progress towards OT goals: Progressing toward goals  Acute Rehab OT Goals Patient Stated Goal: to get half a cigarette OT Goal Formulation: With patient/family Time For Goal Achievement: 12/26/19 Potential to Achieve Goals: Good  Plan Discharge plan remains appropriate    Co-evaluation                 AM-PAC OT "6 Clicks" Daily Activity     Outcome Measure   Help from another person  eating meals?: A Little Help from another person taking care of personal grooming?: A Little Help from another person toileting, which includes using toliet, bedpan, or urinal?: A Little Help from another person bathing (including washing, rinsing, drying)?: A Little Help from another person to put on and taking off regular upper body clothing?: A Little Help from another person to put on and taking off regular lower body clothing?: A Little 6 Click Score: 18    End of Session    OT Visit Diagnosis: Unsteadiness on feet (R26.81);Muscle weakness (generalized) (M62.81)   Activity Tolerance Patient tolerated treatment well   Patient Left in chair;with family/visitor present   Nurse Communication Mobility status;Precautions        Time: 8206-0156 OT Time Calculation (min): 24 min  Charges: OT General Charges $OT Visit: 1 Visit OT Treatments $Self Care/Home Management : 23-37 mins  [place]. Bracken Moffa, COTA/L Acute Rehabilitation Services [place] [facility] 12/14/2019, 12:52 PM

## 2019-12-14 NOTE — Progress Notes (Signed)
Patient has agreed to stay one more night, MD aware of patient being agitated and wanting to go home

## 2019-12-14 NOTE — Progress Notes (Signed)
Inpatient Diabetes Program Recommendations  AACE/ADA: New Consensus Statement on Inpatient Glycemic Control   Target Ranges:  Prepandial:   less than 140 mg/dL      Peak postprandial:   less than 180 mg/dL (1-2 hours)      Critically ill patients:  140 - 180 mg/dL   Results for PEARLIE, LAFOSSE" (MRN [id number]) as of 12/14/2019 09:54  Ref. Range 12/13/2019 06:22 12/13/2019 11:06 12/13/2019 15:59 12/13/2019 21:24 12/14/2019 06:00  Glucose-Capillary Latest Ref Range: 70 - 99 mg/dL 252 (H) 361 (H) 135 (H) 272 (H) 280 (H)   Review of Glycemic Control  Diabetes history:DM2 Outpatient Diabetes medications:Glipizide 2.5 mg BID, Metformin 1000 mg BID Current orders for Inpatient glycemic control:Lantus 8 units daily, Novolog 0-15 units TID with meals, Novolog 0-5 units QHS  Inpatient Diabetes Program Recommendations:  Insulin - Basal: Please consider increasing Lantus to 12 units daily.  Insulin-Meal Coverage: Please consider ordering Novolog 3 units TID with meals for meal coverage if patient eats at least 50% of meals.  HgbA1C:A1C 8.7% on 4/5/21indicating an average glucose of 203 mg/dl over the past 2-3 months. Per H&P, patient received an IVsteroid injection last week which is contributing to elevated A1C.  Thanks, Barnie Alderman, RN, MSN, CDE Diabetes Coordinator Inpatient Diabetes Program [phone] (Team Pager from 8am to 5pm)

## 2019-12-14 NOTE — Plan of Care (Signed)
Dr. Lorraine Lax following the patient.  Recommended 1 more night of observation.  Patient restless.  Nurse called that patient is pacing the hallways. I have recommended Seroquel low-dose 12.5 mg x 1. Please contact primary team for a sitter/other options if continues to be agitated.  -- Amie Portland, MD [facility]

## 2019-12-14 NOTE — Progress Notes (Signed)
Patient's mental status had reportedly been improving, but he has been restless all evening and eventually removed his cardiac monitoring leads and gown, began wandering the halls, and stating that he is leaving the hospital.  He is very pleasant and cooperative but confused.  We were able to get his wife on the phone who spoke with him and encouraged him to return to his bed and try to get some rest.   Plan to continue delirium precautions and low-dose Haldol as needed.

## 2019-12-15 DIAGNOSIS — Z720 Tobacco use: Secondary | ICD-10-CM

## 2019-12-15 DIAGNOSIS — E782 Mixed hyperlipidemia: Secondary | ICD-10-CM

## 2019-12-15 LAB — GLUCOSE, CAPILLARY
Glucose-Capillary: 202 mg/dL — ABNORMAL HIGH (ref 70–99)
Glucose-Capillary: 264 mg/dL — ABNORMAL HIGH (ref 70–99)

## 2019-12-15 LAB — URINALYSIS, ROUTINE W REFLEX MICROSCOPIC
Bilirubin Urine: NEGATIVE
Glucose, UA: >=500 mg/dL — AB
Hgb urine dipstick: NEGATIVE
Ketones, ur: NEGATIVE mg/dL
Leukocytes,Ua: NEGATIVE
Nitrite: NEGATIVE
Protein, ur: >=300 mg/dL — AB
Specific Gravity, Urine: 1.028 (ref 1.005–1.030)
pH: 5 (ref 5.0–8.0)

## 2019-12-15 LAB — VITAMIN B1: Vitamin B1 (Thiamine): 176 nmol/L (ref 66.5–200.0)

## 2019-12-15 MED ORDER — VITAMIN B-12 1000 MCG PO TABS: 1000.0000 ug | ORAL_TABLET | Freq: Every day | ORAL | 0 refills | Status: DC

## 2019-12-15 MED ORDER — DIVALPROEX SODIUM ER 250 MG PO TB24: 250.0000 mg | ORAL_TABLET | Freq: Two times a day (BID) | ORAL | 0 refills | Status: DC

## 2019-12-15 MED ORDER — INSULIN GLARGINE 100 UNIT/ML ~~LOC~~ SOLN
13.0000 [IU] | Freq: Every day | SUBCUTANEOUS | Status: DC
  Administered 2019-12-15: 13 [IU] via SUBCUTANEOUS
  Filled 2019-12-15: qty 0.13

## 2019-12-15 NOTE — Progress Notes (Addendum)
Reason for consult:   Subjective: Patient is more restless this morning, eager to go home.  Is certainly more confused and not oriented to month or year when compared to yesterday.    ROS: negative except above   Examination  Vital signs in last 24 hours: Temp:  [98.2 F (36.8 C)-98.6 F (37 C)] 98.6 F (37 C) (04/09 0916) Pulse Rate:  [74-97] 86 (04/09 0916) Resp:  [18-20] 18 (04/09 0916) BP: (112-145)/(42-92) 144/90 (04/09 0916) SpO2:  [95 %-98 %] 96 % (04/09 0916)  General: lying in bed CVS: pulse-normal rate and rhythm RS: breathing comfortably Extremities: normal   Neuro: MS: Alert, oriented to place and person but not month or year.  Agitated and appears upset. CN: pupils equal and reactive,  EOMI, face symmetric, tongue midline, normal sensation over face, Motor: 5/5 strength in all 4 extremities Reflexes: 2+ bilaterally over patella, biceps, plantars: flexor Coordination: normal Gait: not tested  Basic Metabolic Panel: Recent Labs  Lab 12/11/19 1245 12/11/19 1253 12/11/19 1921 12/12/19 0340 12/13/19 0405  NA 135 136  --  140 141  K 4.4 4.2  --  3.6 3.5  CL 103 102  --  102 103  CO2 23  --   --  24 24  GLUCOSE 388* 376*  --  256* 220*  BUN 23 25*  --  17 17  CREATININE 1.18 1.00 1.03 1.01 1.08  CALCIUM 9.2  --   --  9.3 9.3  MG  --   --  1.8  --   --     CBC: Recent Labs  Lab 12/11/19 1245 12/11/19 1253 12/11/19 1921 12/12/19 0340 12/13/19 0405  WBC 10.4  --  10.6* 10.4 10.6*  NEUTROABS 8.3*  --   --   --   --   HGB 15.8 15.6 16.3 16.0 17.4*  HCT 46.5 46.0 47.7 46.2 51.2  MCV 90.6  --  90.0 90.4 90.5  PLT 266  --  289 256 256     Coagulation Studies: No results for input(s): LABPROT, INR in the last 72 hours.  Imaging Reviewed:  MRI brain with and without contrast shows no acute findings EEG was unremarkable  ASSESSMENT AND PLAN  74 year old male presented to the ED with 3-day history of progressive worsening of mental status.  EEG  showed generalized slowing.  MRI limited due to motion artifact however negative for stroke.  B12 was borderline low and started on treatment.  Lumbar puncture was attempted twice and unsuccessful.  MRI brain was repeated with and without contrast and unremarkable.  Patient has having fluctuating mental status, was much better yesterday morning but has been agitated since yesterday evening wanting to go home.  Wife also wants to take him home today.     Impression: Acute toxic metabolic encephalopathy : hyperglycemia, B12 deficiency, possible medications Behavioral disturbance,  Vitamin B12 deficiency Hyperglycemia    Recommendations: - UA still pending  -Continue to hold narcotics --MRI brain with and without contrast ordered : No acute findings -Continue B12 injections and will need B12 supplement as outpatient -- LP attempted twice, attempted with fluoroscopy, patient has refused any further attempts -Recommend neurocognitive assessment as outpatient --Recommend Depakote 250 mg twice daily  Discussed with wife in detail, states he would like to take the patient home as she feels he would be better at home.  Patient gets most of his care at the [place]. she would like [place] to set up an appointment at with [facility]  neurologist for further evaluation and follow-up-recommend referral to [facility] for follow-up as well as outpatient neurocognitive evaluation in 2 to 4 weeks.     Karena Addison Ayla Dunigan [facility] Pager Number [phone] For questions after 7pm please refer to AMION to reach the Neurologist on call

## 2019-12-15 NOTE — TOC Transition Note (Signed)
Transition of Care [facility]) - CM/SW Discharge Note   Patient Details  Name: Thomas [name] MRN: [id number] Date of Birth: 05-17-1946  Transition of Care [facility]) CM/SW Contact:  Geralynn Ochs, LCSW Phone Number: 12/15/2019, 3:32 PM   Clinical Narrative:   Patient discharging home with [facility]. CSW alerted [facility] that patient discharging today, they will follow. No further needs at this time.    Final next level of care: Home w Home Health Services Barriers to Discharge: Barriers Resolved   Patient Goals and CMS Choice   CMS Medicare.gov Compare Post Acute Care list provided to:: Patient Choice offered to / list presented to : Patient, Spouse  Discharge Placement                Patient to be transferred to facility by: Family Name of family member notified: Self Patient and family notified of of transfer: 12/15/19  Discharge Plan and Services   Discharge Planning Services: CM Consult Post Acute Care Choice: Home Health                    HH Arranged: PT, OT [facility] Agency: [facility] Date [place]: 12/13/19   Representative spoke with at [place]: [place] ([place] Interventions     Readmission Risk Interventions No flowsheet data found.

## 2019-12-15 NOTE — Plan of Care (Signed)
Patient stable, discussed POC with patient, agreeable with plan, denies question/concerns at this time.

## 2019-12-15 NOTE — Discharge Summary (Signed)
Physician Discharge Summary  AWAD GLADD WUX:[id number] DOB: 03/19/1946  PCP: [name], MD  Admitted from: Home Discharged to: Home  Admit date: 12/11/2019 Discharge date: 12/15/2019  Recommendations for Outpatient Follow-up:   Follow-up Information    [name], MD. Schedule an appointment as soon as possible for a visit in 1 week(s).   Specialty: Internal Medicine Why: To be seen with repeat labs (CBC & CMP). Contact information: [place] [id number] [phone]        [facility]. Schedule an appointment as soon as possible for a visit in 2 week(s).   Contact information: Address: [address] [place] [address]  Phone number: [phone]           Home Health: PT and OT Equipment/Devices: None  Discharge Condition: Improved and stable CODE STATUS: Full Diet recommendation: Heart healthy & diabetic diet.  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 passing out spells.  Story given by patient's wife who is at bedside.  He has been having ?? recurrent passing out spells and confusion over the past week, worsening in the last day and decided to bring him to the ED.  Patient has been doing unusual things such as mixing his pills up, putting up his pants but not underwear without realizing and increased confusion.  Smokes a pack per day.  He has a history of arthritis in the left knee and received IV steroids likes week, also taking Percocet for pain control as needed.  There is no other reported symptoms, no fever, no  chills, no headaches, nausea vomiting.  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  MRI was not able to be obtained due to patient's confusion and not cooperating.  Neurology consulted.  EEG without seizures.  Mental status fluctuating.  Multiple attempts at LP by neurology and IR unsuccessful on 4/8.  Subjective / 24h Interval events: Patient interviewed and examined along with his spouse at bedside.  Overnight events reviewed in chart.  Discussed in detail with patient's nursing.  Patient has ongoing waxing and waning mental status changes.  At times he is calm and coherent and at other times he is anxious, pacing around, confused, impulsive, getting out of bed, slightly agitated.  Wife indicates that she stayed with him all night.  He only slept for about 2 to 3 hours early this morning but rest of the time he was pacing in the halls.  She strongly feels that he will be better off at home where his confusion is likely to get better and insists on taking him home today.  Patient has been insisting on going home for the last 2 to 3 days and even threatening to leave AMA.  No visual or auditory hallucinations reported.  No suicidal or homicidal ideations reported.  Spouse indicates that patient never had witnessed passing out episode by anyone.  There is some question about nearly passing out and patient son held him and prevented him from falling.  She also  clearly informs that patient does not take methadone and oxycodone regularly.  He takes it maybe 1 or 2 times a week and never together when he has significant pain.  She was advised to take control of all his medications until his confusion is completely resolved and was advised to take all his medications to the PCPs visit for review.  Assessment & Plan: Principal Problem Acute metabolic encephalopathy/syncope -Neurology consulted, appreciate input.  MRI brain  limited due to motion artifact however negative for stroke.  MRI repeated 4/8 and without acute findings. -EEG without seizure-like activity but showed generalized slowing -Temporarily held opioids and Cymbalta in the hospital.  However at the time of discharge as discussed with Dr. Lorraine Lax, Neurology, since there was not much change in mental status despite holding these medications, and also since patient takes these infrequently, recommends resuming his methadone, oxycodone and Cymbalta at discharge. -B12 low and started on parenteral treatment while hospitalized.  As per neurology, HSV encephalitis felt unlikely however given unexplained acute mental status change on admission, pursued lumbar puncture but multiple attempts by them and IR unsuccessful.  At this time, neurology does not believe that he has features suggestive of meningoencephalitis.  They offered patient another attempt at spinal tap which would be the third go around but patient declined. -Urine microscopy not indicated above UTI and no other clinical suspicion for infectious etiology for mental status changes. -His mental status has improved since admission but not completely resolved.  Overall etiology is not clearly determined but may be multifactorial related to some element of underlying cognitive impairment complicated by undetermined factor but also to include hyperglycemia, B12 deficiency and medications.  His mental status changes were fairly acute in onset.  This is a gentleman who is quite functional, quite good at [employer], no history of dementia until this episode. -Since patient and spouse have repeatedly insisted on being discharged, after extensive discussion with neurology, patient will be discharged home to continue B12 oral supplements and Depakote 250 mg twice daily.  Outpatient follow-up with neurology either at the [facility] or locally in 2 weeks and will need neurocognitive assessment as outpatient. -Spouse has also been  advised to seek immediate medical attention or call 911 in case his condition significantly declines and she verbalizes understanding  ?  Near syncope -None of this history is reliable.  As per detailed discussion with spouse today, no one actually saw patient pass out, not the spouse or son or neighbors.  She feels that may be he was about to pass out and fall prevented by their son.  In any event patient advised not to drive for 6 months and follow-up with PCP regarding need for further evaluation.  Active Problems Type 2 diabetes mellitus -Home Metformin and glipizide were held and he was treated with adjusting doses of Lantus and SSI. -Hemoglobin A1c of 8.7 suggests poor overall control.  Unclear regarding his compliance with his diet or medications.  It also appears that he may have been taking his diabetic medications differently than advised.  Again spouse has been clearly advised to take control of his medications until his confusion has resolved. -Close outpatient follow-up with his PCP for further medication adjustment and may be initiation of insulins.  Essential hypertension -Resume losartan at discharge.  Hyperlipidemia -Resume statins at discharge.  Depression -Held Cymbalta in the hospital which will be resumed at discharge.  Tobacco abuse -Counseled for cessation  Arthritis of the left knee -Received steroid shots last week, may be  the reason for some of his hyperglycemia in which case the hyperglycemia should improve but A1c of 8.7 suggests long-term poor control.  Chronic pain: -Discussion as above.  Consultants:  Neurology IR  Procedures:  Attempts at LP unsuccessful  Discharge Instructions  Discharge Instructions    Ambulatory referral to Neurology   Complete by: As directed    An appointment is requested in approximately: 2 weeks   Call MD for:   Complete by: As directed    Worsening confusion/mental status changes or agitation.   Call MD  for:  difficulty breathing, headache or visual disturbances   Complete by: As directed    Call MD for:  extreme fatigue   Complete by: As directed    Call MD for:  persistant dizziness or light-headedness   Complete by: As directed    Call MD for:  persistant nausea and vomiting   Complete by: As directed    Call MD for:  severe uncontrolled pain   Complete by: As directed    Call MD for:  temperature >100.4   Complete by: As directed    Diet - low sodium heart healthy   Complete by: As directed    Diet Carb Modified   Complete by: As directed    Driving Restrictions   Complete by: As directed    No driving for 6 months.   Increase activity slowly   Complete by: As directed        Medication List    TAKE these medications   aspirin 81 MG chewable tablet Chew 81 mg by mouth daily at 12 noon.   atorvastatin 80 MG tablet Commonly known as: LIPITOR Take 40 mg by mouth at bedtime.   cetirizine 10 MG tablet Commonly known as: ZYRTEC Take 10 mg by mouth daily as needed for allergies.   divalproex 250 MG 24 hr tablet Commonly known as: DEPAKOTE ER Take 1 tablet (250 mg total) by mouth 2 (two) times daily.   DULoxetine 60 MG capsule Commonly known as: CYMBALTA Take 60 mg by mouth every evening.   EPINEPHrine 0.3 mg/0.3 mL Soaj injection Commonly known as: EPI-PEN Inject 0.3 mg into the muscle daily as needed (for anaphylaxis).   finasteride 5 MG tablet Commonly known as: PROSCAR Take 5 mg by mouth daily.   glipiZIDE 5 MG tablet Commonly known as: GLUCOTROL Take 5-10 mg by mouth See admin instructions. Take 2 tablets (10 mg) by mouth daily with breakfast and 1 tablet (5 mg) daily with supper   losartan 25 MG tablet Commonly known as: COZAAR Take 25 mg by mouth daily. For blood pressure and kidney protection   meloxicam 15 MG tablet Commonly known as: MOBIC Take 7.5 mg by mouth 2 (two) times daily as needed for pain.   metFORMIN 1000 MG tablet Commonly known  as: GLUCOPHAGE Take 1,000 mg by mouth 2 (two) times daily with a meal.   methadone 5 MG tablet Commonly known as: DOLOPHINE Take 5 mg by mouth every other day. For severe chronic pain   nitroGLYCERIN 0.4 MG SL tablet Commonly known as: NITROSTAT Place 1 tablet (0.4 mg total) under the tongue every 5 (five) minutes as needed for chest pain.   omeprazole 40 MG capsule Commonly known as: PRILOSEC Take 40 mg by mouth daily.   oxyCODONE 5 MG immediate release tablet Commonly known as: Oxy IR/ROXICODONE Take 5 mg by mouth 4 (four) times daily as needed (severe chronic pain).   terazosin 2 MG capsule Commonly  known as: HYTRIN Take 2 mg by mouth at bedtime.   vitamin B-12 1000 MCG tablet Commonly known as: CYANOCOBALAMIN Take 1 tablet (1,000 mcg total) by mouth daily.   Voltaren 1 % Gel Generic drug: diclofenac Sodium Apply 1 application topically 4 (four) times daily as needed (pain).      Allergies  Allergen Reactions  . Wasp Venom Anaphylaxis, Itching and Swelling      Procedures/Studies: EEG  Result Date: 12/12/2019 Lora Havens, MD     12/12/2019  9:48 AM Patient Name: TEDDIE MEHTA MRN: [id number] Epilepsy Attending: Lora Havens Referring Physician/Provider: Etta Quill, PA Date: 12/12/2019 Duration: 22.31 minutes Patient history: 74 year old male with altered mental status.  EEG to evaluate for seizures. Level of alertness: Awake AEDs during EEG study: None Technical aspects: This EEG study was done with scalp electrodes positioned according to the 10-20 International system of electrode placement. Electrical activity was acquired at a sampling rate of 500Hz and reviewed with a high frequency filter of 70Hz and a low frequency filter of 1Hz. EEG data were recorded continuously and digitally stored. Description: During awake state, no clear posterior dominant rhythm was seen.  EEG showed continuous generalized 6 to 7 Hz theta slowing.  Hyperventilation photic stimulation  were not performed. Of note, EEG was technically difficult due to significant movement artifact Abnormality -Continuous slow, generalized IMPRESSION: This technically difficult study suggestive of mild to moderate diffuse encephalopathy, nonspecific to etiology. No seizures or epileptiform discharges were seen throughout the recording. Lora Havens   CT Angio Head W or Wo Contrast  Result Date: 12/11/2019 CLINICAL DATA:  Imbalance. EXAM: CT ANGIOGRAPHY HEAD AND NECK CT PERFUSION BRAIN TECHNIQUE: Multidetector CT imaging of the head and neck was performed using the standard protocol during bolus administration of intravenous contrast. Multiplanar CT image reconstructions and MIPs were obtained to evaluate the vascular anatomy. Carotid stenosis measurements (when applicable) are obtained utilizing NASCET criteria, using the distal internal carotid diameter as the denominator. Multiphase CT imaging of the brain was performed following IV bolus contrast injection. Subsequent parametric perfusion maps were calculated using RAPID software. CONTRAST:  112m OMNIPAQUE IOHEXOL 350 MG/ML SOLN COMPARISON:  Noncontrast head CT earlier today. No prior angiographic imaging. FINDINGS: CTA NECK FINDINGS Aortic arch: Standard 3 vessel aortic arch with mild atherosclerotic plaque. Widely patent arch vessel origins. Right carotid system: Patent with small volume calcified and soft plaque at the carotid bifurcation. No evidence of stenosis or dissection. Left carotid system: Patent with predominantly soft plaque in the mid common carotid artery and small volume calcified plaque in the carotid bulb. No evidence of significant stenosis or dissection. Vertebral arteries: Patent with the left being mildly to moderately dominant. Calcified plaque at the origins of both vertebral arteries without evidence of significant stenosis or dissection. Skeleton: Moderate cervical disc and facet degeneration. Other neck: No evidence of cervical  lymphadenopathy or mass. Upper chest: Clear lung apices. Review of the MIP images confirms the above findings CTA HEAD FINDINGS Anterior circulation: The internal carotid arteries are patent from skull base to carotid termini with atherosclerotic plaque resulting in moderate stenosis near the left cavernous-petrous junction and in the left cavernous segment. ACAs and MCAs are patent without evidence of proximal branch occlusion or significant A1 or M1 stenosis. Branch vessel evaluation is limited by early contrast timing. No aneurysm is identified. Posterior circulation: The intracranial vertebral arteries are widely patent to the basilar. The left PICA and right AICA appear dominant. Patent SCAs are  seen bilaterally. The basilar artery is widely patent. There is a small left posterior communicating artery. Both PCAs are patent without evidence of significant proximal stenosis on the left. There is a severe mid right P2 stenosis. No aneurysm is identified. Venous sinuses: Not evaluated due to contrast timing. Anatomic variants: None. Review of the MIP images confirms the above findings CT Brain Perfusion Findings: ASPECTS: Not scored with this history CBF (<30%) Volume: 0 mL Perfusion (Tmax>6.0s) volume: 0 mL Mismatch Volume: 0 mL Infarction Location: None evident by CTP IMPRESSION: 1. No large vessel occlusion. 2. 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 3. Cervical carotid and vertebral artery atherosclerosis without stenosis. 4. Negative CTP. 5. Aortic Atherosclerosis (ICD10-I70.0). Electronically Signed   By: Logan Bores M.D.   On: 12/11/2019 14:58   CT HEAD WO CONTRAST  Result Date: 12/11/2019 CLINICAL DATA:  Ataxia with balance loss EXAM: CT HEAD WITHOUT CONTRAST TECHNIQUE: Contiguous axial images were obtained from the base of the skull through the vertex without intravenous contrast. COMPARISON:  None. FINDINGS: Brain: There is mild diffuse atrophy. There is no  intracranial mass, hemorrhage, extra-axial fluid collection, or midline shift. There is mild small vessel disease in the centra semiovale bilaterally. Elsewhere brain parenchyma appears unremarkable. There is no demonstrable acute infarct. Vascular: There is no hyperdense vessel. There is calcification in each carotid siphon region. Skull: Bony calvarium appears intact. Sinuses/Orbits: There is mucosal thickening in several ethmoid air cells. Other paranasal sinuses are clear. Orbits appear symmetric bilaterally. Other: Mastoid air cells are clear. IMPRESSION: Mild diffuse atrophy with mild periventricular small vessel disease. No evident acute infarct. No mass or hemorrhage. There are foci of arterial vascular calcification. There is mucosal thickening in several ethmoid air cells. Electronically Signed   By: Lowella Grip III M.D.   On: 12/11/2019 13:56   CT Angio Neck W and/or Wo Contrast  Result Date: 12/11/2019 CLINICAL DATA:  Imbalance. EXAM: CT ANGIOGRAPHY HEAD AND NECK CT PERFUSION BRAIN TECHNIQUE: Multidetector CT imaging of the head and neck was performed using the standard protocol during bolus administration of intravenous contrast. Multiplanar CT image reconstructions and MIPs were obtained to evaluate the vascular anatomy. Carotid stenosis measurements (when applicable) are obtained utilizing NASCET criteria, using the distal internal carotid diameter as the denominator. Multiphase CT imaging of the brain was performed following IV bolus contrast injection. Subsequent parametric perfusion maps were calculated using RAPID software. CONTRAST:  169m OMNIPAQUE IOHEXOL 350 MG/ML SOLN COMPARISON:  Noncontrast head CT earlier today. No prior angiographic imaging. FINDINGS: CTA NECK FINDINGS Aortic arch: Standard 3 vessel aortic arch with mild atherosclerotic plaque. Widely patent arch vessel origins. Right carotid system: Patent with small volume calcified and soft plaque at the carotid bifurcation.  No evidence of stenosis or dissection. Left carotid system: Patent with predominantly soft plaque in the mid common carotid artery and small volume calcified plaque in the carotid bulb. No evidence of significant stenosis or dissection. Vertebral arteries: Patent with the left being mildly to moderately dominant. Calcified plaque at the origins of both vertebral arteries without evidence of significant stenosis or dissection. Skeleton: Moderate cervical disc and facet degeneration. Other neck: No evidence of cervical lymphadenopathy or mass. Upper chest: Clear lung apices. Review of the MIP images confirms the above findings CTA HEAD FINDINGS Anterior circulation: The internal carotid arteries are patent from skull base to carotid termini with atherosclerotic plaque resulting in moderate stenosis near the left cavernous-petrous junction and in the left cavernous  segment. ACAs and MCAs are patent without evidence of proximal branch occlusion or significant A1 or M1 stenosis. Branch vessel evaluation is limited by early contrast timing. No aneurysm is identified. Posterior circulation: The intracranial vertebral arteries are widely patent to the basilar. The left PICA and right AICA appear dominant. Patent SCAs are seen bilaterally. The basilar artery is widely patent. There is a small left posterior communicating artery. Both PCAs are patent without evidence of significant proximal stenosis on the left. There is a severe mid right P2 stenosis. No aneurysm is identified. Venous sinuses: Not evaluated due to contrast timing. Anatomic variants: None. Review of the MIP images confirms the above findings CT Brain Perfusion Findings: ASPECTS: Not scored with this history CBF (<30%) Volume: 0 mL Perfusion (Tmax>6.0s) volume: 0 mL Mismatch Volume: 0 mL Infarction Location: None evident by CTP IMPRESSION: 1. No large vessel occlusion. 2. 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 3.  Cervical carotid and vertebral artery atherosclerosis without stenosis. 4. Negative CTP. 5. Aortic Atherosclerosis (ICD10-I70.0). Electronically Signed   By: Logan Bores M.D.   On: 12/11/2019 14:58   MR BRAIN WO CONTRAST  Result Date: 12/12/2019 CLINICAL DATA:  Encephalopathy. EXAM: MRI HEAD WITHOUT CONTRAST TECHNIQUE: Multiplanar diffusion-weighted MRI imaging of the brain and surrounding structures were obtained without intravenous contrast. COMPARISON:  12/11/2019 CTA head and neck and CT cerebral perfusion. FINDINGS: Limited examination secondary to motion artifact and limited sequence acquisition. Brain: Moderate diffuse parenchymal volume loss with ex vacuo dilatation. No midline shift, extra-axial collection or mass lesion. No focal diffusion-weighted abnormality. Vascular: Please see recent CTA head and neck for better evaluation of the vasculature. Skull and upper cervical spine: Not well evaluated on the current exam. Sinuses/Orbits: Motion artifact limits evaluation of the orbits. Other: None. IMPRESSION: No acute intracranial abnormality demonstrated on diffusion-weighted imaging. Limited examination secondary to motion artifact and limited sequence acquisition. Electronically Signed   By: Primitivo Gauze M.D.   On: 12/12/2019 15:44   MR BRAIN W WO CONTRAST  Result Date: 12/14/2019 CLINICAL DATA:  74 year old male status post code stroke presentation on 12/11/2019. Continued encephalopathy. EXAM: MRI HEAD WITHOUT AND WITH CONTRAST TECHNIQUE: Multiplanar, multiecho pulse sequences of the brain and surrounding structures were obtained without and with intravenous contrast. CONTRAST:  37m GADAVIST GADOBUTROL 1 MMOL/ML IV SOLN COMPARISON:  Limited diffusion only brain MRI 12/12/2019. CTA, CTP and CT head 12/11/2019. FINDINGS: Brain: No restricted diffusion to suggest acute infarction. No midline shift, mass effect, evidence of mass lesion, ventriculomegaly, extra-axial collection or acute  intracranial hemorrhage. Cervicomedullary junction and pituitary are within normal limits. No cortical encephalomalacia or chronic cerebral blood products identified. Mild for age nonspecific cerebral white matter T2 and FLAIR hyperintensity, mostly periventricular. The deep gray nuclei, brainstem and cerebellum are within normal limits for age. - No abnormal enhancement identified. No dural thickening. Vascular: Major intracranial vascular flow voids are preserved. The left vertebral artery appears mildly dominant. The major dural venous sinuses are enhancing and appear to be patent. Skull and upper cervical spine: C3-C4 disc degeneration resulting in mild spinal stenosis on series 5, image 12. Normal visible bone marrow signal. Sinuses/Orbits: Negative orbits. Paranasal sinuses and mastoids are stable and well pneumatized. Other: Trace retained secretions in the nasopharynx. Visible internal auditory structures appear normal. IMPRESSION: No acute intracranial abnormality and largely unremarkable for age MRI appearance of the brain. Electronically Signed   By: HGenevie AnnM.D.   On: 12/14/2019 18:43   DG Fluoro  Rm 1-60 Min  Result Date: 12/13/2019 CLINICAL DATA:  Encephalopathy. EXAM: DG C-ARM 1-60 MIN FLUOROSCOPY TIME:  Fluoroscopy Time:  1 minutes 12 seconds Radiation Exposure Index (if provided by the fluoroscopic device): 10.9 mGy Number of Acquired Spot Images: 0 COMPARISON:  CT study from 2016. FINDINGS: The L4-5 interspace was selected with fluoroscopy in the site was marked. After sterile prep and drape attempts were made to access the L4-5 interspace to the right and left of the spinous process without success. Attempts to oblique the patient were unsuccessful as the patient was uncomfortable and had a difficult time remaining still for the procedure. After multiple attempts the procedure was terminated. The patient was returned to the floor in good condition. IMPRESSION: Unsuccessful lumbar puncture as  described. Electronically Signed   By: Zetta Bills M.D.   On: 12/13/2019 16:09   CT CEREBRAL PERFUSION W CM (CHARM STUDY)  Result Date: 12/11/2019 CLINICAL DATA:  Imbalance. EXAM: CT ANGIOGRAPHY HEAD AND NECK CT PERFUSION BRAIN TECHNIQUE: Multidetector CT imaging of the head and neck was performed using the standard protocol during bolus administration of intravenous contrast. Multiplanar CT image reconstructions and MIPs were obtained to evaluate the vascular anatomy. Carotid stenosis measurements (when applicable) are obtained utilizing NASCET criteria, using the distal internal carotid diameter as the denominator. Multiphase CT imaging of the brain was performed following IV bolus contrast injection. Subsequent parametric perfusion maps were calculated using RAPID software. CONTRAST:  164m OMNIPAQUE IOHEXOL 350 MG/ML SOLN COMPARISON:  Noncontrast head CT earlier today. No prior angiographic imaging. FINDINGS: CTA NECK FINDINGS Aortic arch: Standard 3 vessel aortic arch with mild atherosclerotic plaque. Widely patent arch vessel origins. Right carotid system: Patent with small volume calcified and soft plaque at the carotid bifurcation. No evidence of stenosis or dissection. Left carotid system: Patent with predominantly soft plaque in the mid common carotid artery and small volume calcified plaque in the carotid bulb. No evidence of significant stenosis or dissection. Vertebral arteries: Patent with the left being mildly to moderately dominant. Calcified plaque at the origins of both vertebral arteries without evidence of significant stenosis or dissection. Skeleton: Moderate cervical disc and facet degeneration. Other neck: No evidence of cervical lymphadenopathy or mass. Upper chest: Clear lung apices. Review of the MIP images confirms the above findings CTA HEAD FINDINGS Anterior circulation: The internal carotid arteries are patent from skull base to carotid termini with atherosclerotic plaque  resulting in moderate stenosis near the left cavernous-petrous junction and in the left cavernous segment. ACAs and MCAs are patent without evidence of proximal branch occlusion or significant A1 or M1 stenosis. Branch vessel evaluation is limited by early contrast timing. No aneurysm is identified. Posterior circulation: The intracranial vertebral arteries are widely patent to the basilar. The left PICA and right AICA appear dominant. Patent SCAs are seen bilaterally. The basilar artery is widely patent. There is a small left posterior communicating artery. Both PCAs are patent without evidence of significant proximal stenosis on the left. There is a severe mid right P2 stenosis. No aneurysm is identified. Venous sinuses: Not evaluated due to contrast timing. Anatomic variants: None. Review of the MIP images confirms the above findings CT Brain Perfusion Findings: ASPECTS: Not scored with this history CBF (<30%) Volume: 0 mL Perfusion (Tmax>6.0s) volume: 0 mL Mismatch Volume: 0 mL Infarction Location: None evident by CTP IMPRESSION: 1. No large vessel occlusion. 2. 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 3. Cervical carotid and vertebral artery atherosclerosis without  stenosis. 4. Negative CTP. 5. Aortic Atherosclerosis (ICD10-I70.0). Electronically Signed   By: Logan Bores M.D.   On: 12/11/2019 14:58      Subjective: History as noted above.  As discussed with patient spouse and neurologist, mental status changes have improved compared to admission but not completely resolved.  Ongoing waxing and waning confusion but has been consistently alert.  Insisting on going home.  Threatening to leave AMA.  He feels that he can rest better at home.  Has not had adequate sleep over the last several nights.  This also may be contributing to his confusion.  Discharge Exam:  Vitals:   12/15/19 0044 12/15/19 0439 12/15/19 0916 12/15/19 1201  BP: (!) 145/92 (!) 112/42 (!)  144/90 (!) 149/92  Pulse: 97 74 86 (!) 118  Resp: _0 Temp: 98.3 F (36.8 C) 98.4 F (36.9 C) 98.6 F (37 C) 98.5 F (36.9 C)  TempSrc: Oral Oral Oral Oral  SpO2: 96% 95% 96% 96%    Constitutional: Elderly male, moderately built and nourished fully dressed seen sitting up comfortably in bed this morning.  Appears slightly restless at times. Eyes: no scleral icterus ENMT: Mucous membranes are moist.  Neck: normal, supple Respiratory: Clear to auscultation.  No increased work of breathing. Cardiovascular: Regular rate and rhythm, no murmurs / rubs / gallops.  Telemetry personally reviewed: Sinus rhythm earlier but seems to have pulled out the telemetry.. Abdomen: non distended, no tenderness. Bowel sounds positive.  Musculoskeletal: no clubbing / cyanosis.  Skin: no rashes Neurologic: Alert and oriented x self/person, partly to place but not to time.  No focal neurological deficits.  Ongoing confusion. Psychiatric: Unable to assess at this time.   The results of significant diagnostics from this hospitalization (including imaging, microbiology, ancillary and laboratory) are listed below for reference.     Microbiology: Recent Results (from the past 240 hour(s))  SARS CORONAVIRUS 2 (TAT 6-24 HRS) Nasopharyngeal Nasopharyngeal Swab     Status: None   Collection Time: 12/11/19  4:03 PM   Specimen: Nasopharyngeal Swab  Result Value Ref Range Status   SARS Coronavirus 2 NEGATIVE NEGATIVE Final    Comment: (NOTE) SARS-CoV-2 target nucleic acids are NOT DETECTED. The SARS-CoV-2 RNA is generally detectable in upper and lower respiratory specimens during the acute phase of infection. Negative results do not preclude SARS-CoV-2 infection, do not rule out co-infections with other pathogens, and should not be used as the sole basis for treatment or other patient management decisions. Negative results must be combined with clinical observations, patient history, and  epidemiological information. The expected result is Negative. Fact Sheet for Patients: SugarRoll.be Fact Sheet for Healthcare Providers: https://www.woods-mathews.com/ This test is not yet approved or cleared by the [place] FDA and  has been authorized for detection and/or diagnosis of SARS-CoV-2 by FDA under an Emergency Use Authorization (EUA). This EUA will remain  in effect (meaning this test can be used) for the duration of the COVID-19 declaration under Section 56 4(b)(1) of the Act, 21 U.S.C. section 360bbb-3(b)(1), unless the authorization is terminated or revoked sooner. Performed at [facility], [place] [address] [place], [place] [address]      Labs: CBC: Recent Labs  Lab 12/11/19 1245 12/11/19 1253 12/11/19 1921 12/12/19 0340 12/13/19 0405  WBC 10.4  --  10.6* 10.4 10.6*  NEUTROABS 8.3*  --   --   --   --   HGB 15.8 15.6 16.3 16.0 17.4*  HCT 46.5 46.0  47.7 46.2 51.2  MCV 90.6  --  90.0 90.4 90.5  PLT 266  --  289 256 615    Basic Metabolic Panel: Recent Labs  Lab 12/11/19 1245 12/11/19 1253 12/11/19 1921 12/12/19 0340 12/13/19 0405  NA 135 136  --  140 141  K 4.4 4.2  --  3.6 3.5  CL 103 102  --  102 103  CO2 23  --   --  24 24  GLUCOSE 388* 376*  --  256* 220*  BUN 23 25*  --  17 17  CREATININE 1.18 1.00 1.03 1.01 1.08  CALCIUM 9.2  --   --  9.3 9.3  MG  --   --  1.8  --   --     Liver Function Tests: Recent Labs  Lab 12/11/19 1245 12/12/19 0340 12/13/19 0405  AST _0 ALT _1 ALKPHOS 109 103 104  BILITOT 0.6 0.7 1.0  PROT 6.1* 5.7* 5.8*  ALBUMIN 3.6 3.3* 3.4*    CBG: Recent Labs  Lab 12/14/19 1140 12/14/19 1834 12/14/19 2151 12/15/19 0618 12/15/19 1203  GLUCAP 306* 216* 248* 202* 264*   Urinalysis    Component Value Date/Time   COLORURINE YELLOW 12/15/2019 1230   APPEARANCEUR HAZY (A) 12/15/2019 1230   LABSPEC 1.028 12/15/2019 1230   PHURINE 5.0 12/15/2019  1230   GLUCOSEU >=500 (A) 12/15/2019 1230   HGBUR NEGATIVE 12/15/2019 1230   BILIRUBINUR NEGATIVE 12/15/2019 1230   KETONESUR NEGATIVE 12/15/2019 1230   PROTEINUR >=300 (A) 12/15/2019 1230   NITRITE NEGATIVE 12/15/2019 1230   LEUKOCYTESUR NEGATIVE 12/15/2019 1230    Discussed in detail with patient spouse at bedside, updated care and answered questions.  Time coordinating discharge: 40 minutes  SIGNED:  Vernell Leep, MD, [place], [facility]. [facility]  To contact the attending provider between 7A-7P or the covering provider during after hours 7P-7A, please log into the web site [email or web address] and access using universal [place] password for that web site. If you do not have the password, please call the hospital operator.

## 2019-12-15 NOTE — Discharge Instructions (Addendum)

## 2019-12-15 NOTE — Progress Notes (Signed)
PT Cancellation Note  Patient Details Name: BENJERMAN MOLINELLI MRN: [id number] DOB: 09/09/1945   Cancelled Treatment:    Reason Eval/Treat Not Completed: Patient declined, no reason specified;Other (comment).  Pt was confused, agitated and argumentative.  We try back another day. 12/15/2019  [name], PT Acute Rehabilitation Services [phone]  (pager) [phone]  (office)   Tessie Fass Sigifredo Pignato 12/15/2019, 11:31 AM

## 2019-12-20 ENCOUNTER — Other Ambulatory Visit: Payer: Self-pay | Admitting: *Deleted

## 2019-12-20 LAB — CULTURE, BLOOD (ROUTINE X 2)
Culture: NO GROWTH
Culture: NO GROWTH
Special Requests: ADEQUATE

## 2019-12-20 NOTE — Patient Outreach (Signed)
[place] [facility]) Care Management  12/20/2019  Thomas [name] 02-Feb-1946 [id number]   EMMI-general discharge On APL Discharge from [place] Day # 1 Date: Sunday December 17 2019 [place] Reason: Scheduled follow-up? No Referral request verification of primary care provider (PCP) ? Please verify PCP. [facility] has [name] ? cardiology  Epic list Dr Janett Billow as [facility] administration ([place]) MD   Insurance: NextGen Medicare and  Tricare for Life   [facility] admissions x 1 ED visits x 1in the last 6 months  [facility] 12/11/19 for delirium,confusion and passing out spells d/c 12/15/19 [facility]   Outreach attempt # 1 unsuccessful No answer. [facility] RN CM left HIPAA [facility] Portability and Accountability Act) compliant voicemail message along with CM's contact info.    EMMI:  Pending response from voice message left   Conditions: Acute toxic metabolic encephalopathy, hyperglycemia B12 deficiency, behavioral disturbance, Hypertension (HTN). Hyperlipidemia (HLD), Diabetes (DM) type 2, GERD (gastroesophageal reflux disease), depression, obesity,  Left knee pain/arthritis, current smoker  Appointments: 12/28/19 0915 dr Marcene Duos orthropedic Left knee pain/arthritis 12/28/19 [place] NP [place] neurology    Plan: [facility] RN CM sent an unsuccessful outreach letter and scheduled this patient for another call attempt within 4 business days  Jakin L. Lavina Hamman, RN, BSN, [place] Coordinator Office number [phone] Mobile number [phone]  Main [facility] number [phone] Fax number [phone]

## 2019-12-21 DIAGNOSIS — G9341 Metabolic encephalopathy: Secondary | ICD-10-CM | POA: Diagnosis not present

## 2019-12-21 DIAGNOSIS — I2510 Atherosclerotic heart disease of native coronary artery without angina pectoris: Secondary | ICD-10-CM | POA: Diagnosis not present

## 2019-12-21 DIAGNOSIS — K219 Gastro-esophageal reflux disease without esophagitis: Secondary | ICD-10-CM | POA: Diagnosis not present

## 2019-12-21 DIAGNOSIS — L219 Seborrheic dermatitis, unspecified: Secondary | ICD-10-CM | POA: Diagnosis not present

## 2019-12-21 DIAGNOSIS — F17210 Nicotine dependence, cigarettes, uncomplicated: Secondary | ICD-10-CM | POA: Diagnosis not present

## 2019-12-21 DIAGNOSIS — M4802 Spinal stenosis, cervical region: Secondary | ICD-10-CM | POA: Diagnosis not present

## 2019-12-21 DIAGNOSIS — N402 Nodular prostate without lower urinary tract symptoms: Secondary | ICD-10-CM | POA: Diagnosis not present

## 2019-12-21 DIAGNOSIS — M1712 Unilateral primary osteoarthritis, left knee: Secondary | ICD-10-CM | POA: Diagnosis not present

## 2019-12-21 DIAGNOSIS — M25562 Pain in left knee: Secondary | ICD-10-CM | POA: Diagnosis not present

## 2019-12-21 DIAGNOSIS — I130 Hypertensive heart and chronic kidney disease with heart failure and stage 1 through stage 4 chronic kidney disease, or unspecified chronic kidney disease: Secondary | ICD-10-CM | POA: Diagnosis not present

## 2019-12-21 DIAGNOSIS — M5031 Other cervical disc degeneration,  high cervical region: Secondary | ICD-10-CM | POA: Diagnosis not present

## 2019-12-21 DIAGNOSIS — G608 Other hereditary and idiopathic neuropathies: Secondary | ICD-10-CM | POA: Diagnosis not present

## 2019-12-21 DIAGNOSIS — E669 Obesity, unspecified: Secondary | ICD-10-CM | POA: Diagnosis not present

## 2019-12-21 DIAGNOSIS — E785 Hyperlipidemia, unspecified: Secondary | ICD-10-CM | POA: Diagnosis not present

## 2019-12-21 DIAGNOSIS — I5032 Chronic diastolic (congestive) heart failure: Secondary | ICD-10-CM | POA: Diagnosis not present

## 2019-12-21 DIAGNOSIS — N183 Chronic kidney disease, stage 3 unspecified: Secondary | ICD-10-CM | POA: Diagnosis not present

## 2019-12-21 DIAGNOSIS — E1151 Type 2 diabetes mellitus with diabetic peripheral angiopathy without gangrene: Secondary | ICD-10-CM | POA: Diagnosis not present

## 2019-12-21 DIAGNOSIS — G4730 Sleep apnea, unspecified: Secondary | ICD-10-CM | POA: Diagnosis not present

## 2019-12-21 DIAGNOSIS — I700 Atherosclerosis of aorta: Secondary | ICD-10-CM | POA: Diagnosis not present

## 2019-12-21 DIAGNOSIS — R32 Unspecified urinary incontinence: Secondary | ICD-10-CM | POA: Diagnosis not present

## 2019-12-21 DIAGNOSIS — L570 Actinic keratosis: Secondary | ICD-10-CM | POA: Diagnosis not present

## 2019-12-21 DIAGNOSIS — I10 Essential (primary) hypertension: Secondary | ICD-10-CM | POA: Diagnosis not present

## 2019-12-21 DIAGNOSIS — I350 Nonrheumatic aortic (valve) stenosis: Secondary | ICD-10-CM | POA: Diagnosis not present

## 2019-12-21 DIAGNOSIS — E291 Testicular hypofunction: Secondary | ICD-10-CM | POA: Diagnosis not present

## 2019-12-21 DIAGNOSIS — Z7982 Long term (current) use of aspirin: Secondary | ICD-10-CM | POA: Diagnosis not present

## 2019-12-21 DIAGNOSIS — F329 Major depressive disorder, single episode, unspecified: Secondary | ICD-10-CM | POA: Diagnosis not present

## 2019-12-21 DIAGNOSIS — E1122 Type 2 diabetes mellitus with diabetic chronic kidney disease: Secondary | ICD-10-CM | POA: Diagnosis not present

## 2019-12-22 ENCOUNTER — Other Ambulatory Visit: Payer: Self-pay | Admitting: *Deleted

## 2019-12-22 NOTE — Patient Outreach (Addendum)
[place] [facility]) Care Management  12/22/2019  BORNA WESSINGER 1946-06-10 [id number]   EMMI-general discharge On APL Discharge from [place] Day # 1 Date: Sunday December 17 2019 [place] Reason: Scheduled follow-up? No AND  Red on EMMI alert  Day #4 Date 12/20/19 1001  Red Alert Reason:  Sad/hopeless/anxious/empty? Yes  Referral request verification of primary care provider (PCP) ? Please verify PCP. [facility] has [name] ? cardiology  Epic list Dr Janett Billow as [facility] administration ([place]) MD   Insurance: NextGen Medicare and  Tricare for Life   [facility] x 1 ED visits x 1in the last 6 months  [facility] admission 12/11/19 for delirium,confusion and passing out spells d/c 12/15/19 [facility]    [facility] RN CM received a message from Mrs Rasnic on 12/20/19  Outreach attempt # 2 unsuccessful No answer. [facility] RN CM left HIPAA [facility] Portability and Accountability Act) compliant voicemail message along with CM's contact info.    EMMI:  Pending response from voice messages left on 12/20/19 & 12/22/19    Conditions: Acute toxic metabolic encephalopathy, hyperglycemia B12 deficiency, behavioral disturbance, Hypertension (HTN). Hyperlipidemia (HLD), Diabetes (DM) type 2, GERD (gastroesophageal reflux disease), depression, obesity,  Left knee pain/arthritis, current smoker  Appointments: 12/28/19 0915 dr Marcene Duos orthropedic Left knee pain/arthritis 12/28/19 [place] NP [place] neurology    Plan: [facility] RN CM scheduled this patient for a final call attempt within 4 -7 business days  Giulian Goldring L. Lavina Hamman, RN, BSN, [place] Coordinator Office number [phone] Mobile number [phone]  Main [facility] number [phone] Fax number [phone]

## 2019-12-24 DIAGNOSIS — G9341 Metabolic encephalopathy: Secondary | ICD-10-CM | POA: Diagnosis not present

## 2019-12-24 DIAGNOSIS — I130 Hypertensive heart and chronic kidney disease with heart failure and stage 1 through stage 4 chronic kidney disease, or unspecified chronic kidney disease: Secondary | ICD-10-CM | POA: Diagnosis not present

## 2019-12-24 DIAGNOSIS — E1122 Type 2 diabetes mellitus with diabetic chronic kidney disease: Secondary | ICD-10-CM | POA: Diagnosis not present

## 2019-12-24 DIAGNOSIS — N183 Chronic kidney disease, stage 3 unspecified: Secondary | ICD-10-CM | POA: Diagnosis not present

## 2019-12-24 DIAGNOSIS — M1712 Unilateral primary osteoarthritis, left knee: Secondary | ICD-10-CM | POA: Diagnosis not present

## 2019-12-24 DIAGNOSIS — I5032 Chronic diastolic (congestive) heart failure: Secondary | ICD-10-CM | POA: Diagnosis not present

## 2019-12-26 ENCOUNTER — Other Ambulatory Visit: Payer: Self-pay | Admitting: *Deleted

## 2019-12-26 DIAGNOSIS — M1712 Unilateral primary osteoarthritis, left knee: Secondary | ICD-10-CM | POA: Diagnosis not present

## 2019-12-26 DIAGNOSIS — G9341 Metabolic encephalopathy: Secondary | ICD-10-CM | POA: Diagnosis not present

## 2019-12-26 DIAGNOSIS — I5032 Chronic diastolic (congestive) heart failure: Secondary | ICD-10-CM | POA: Diagnosis not present

## 2019-12-26 DIAGNOSIS — N183 Chronic kidney disease, stage 3 unspecified: Secondary | ICD-10-CM | POA: Diagnosis not present

## 2019-12-26 DIAGNOSIS — E1122 Type 2 diabetes mellitus with diabetic chronic kidney disease: Secondary | ICD-10-CM | POA: Diagnosis not present

## 2019-12-26 DIAGNOSIS — I130 Hypertensive heart and chronic kidney disease with heart failure and stage 1 through stage 4 chronic kidney disease, or unspecified chronic kidney disease: Secondary | ICD-10-CM | POA: Diagnosis not present

## 2019-12-26 NOTE — Patient Outreach (Signed)
[place] [facility]) Care Management  12/26/2019  Thomas [name] 03-Nov-1945 [id number]   EMMI-general discharge On APL Discharge from [place] Day #1 Date:Sunday December 17 2019 [place] Reason:Scheduled follow-up? No AND  Red on EMMI alert  Day #4 Date 12/20/19 1001  Red Alert Reason: Sad/hopeless/anxious/empty? Yes  Referral request verification ofprimary care provider (PCP)?Please verify PCP. [name]? cardiology  [facility]  Insurance:NextGen MedicareandTricare for Life   [facility] x1ED visits x 1in the last 6 months Last [facility] admission 12/11/19 for delirium,confusion and passing out spells d/c 12/15/19 [facility]   [facility] RN CM received a message from Thomas [name] on 12/25/19 in [place] absence stating she had received [facility] RN CM letter sent on 12/20/19 Outreach attempt # 3 unsuccessful No answer to the home number [phone] [facility] RN CM left HIPAA ([place]) compliant voicemail message along with CM's contact info. [facility] RN CM dialed Thomas [name] mobile number [phone] The phone line was answered by no one spoke when greeted x 2  These are the only 2 numbers listed for Thomas & [name]   EMMI:Pending response from voice messages lefton 12/20/19 & 12/22/19    Conditions:Acute toxic metabolic encephalopathy, hyperglycemia B12 deficiency, behavioral disturbance,Hypertension (HTN).Hyperlipidemia (HLD),Diabetes (DM) type 2,GERD (gastroesophageal reflux disease), depression, obesity,Left knee pain/arthritis, current smoker  Appointments: 12/28/19 0915 dr Marcene Duos orthropedic Left knee pain/arthritis 12/28/19 [place] NP [place] neurology   Plan: [facility] RN CM scheduled this patient for case closure within 6 business days Routed note to MD    Rochelle. Lavina Hamman, RN, BSN, [place] Coordinator Office number [phone] Mobile number [phone]  Main [facility] number [phone] Fax number [phone]

## 2019-12-27 DIAGNOSIS — E1122 Type 2 diabetes mellitus with diabetic chronic kidney disease: Secondary | ICD-10-CM | POA: Diagnosis not present

## 2019-12-27 DIAGNOSIS — N183 Chronic kidney disease, stage 3 unspecified: Secondary | ICD-10-CM | POA: Diagnosis not present

## 2019-12-27 DIAGNOSIS — I5032 Chronic diastolic (congestive) heart failure: Secondary | ICD-10-CM | POA: Diagnosis not present

## 2019-12-27 DIAGNOSIS — M1712 Unilateral primary osteoarthritis, left knee: Secondary | ICD-10-CM | POA: Diagnosis not present

## 2019-12-27 DIAGNOSIS — I130 Hypertensive heart and chronic kidney disease with heart failure and stage 1 through stage 4 chronic kidney disease, or unspecified chronic kidney disease: Secondary | ICD-10-CM | POA: Diagnosis not present

## 2019-12-27 DIAGNOSIS — G9341 Metabolic encephalopathy: Secondary | ICD-10-CM | POA: Diagnosis not present

## 2019-12-28 ENCOUNTER — Encounter: Payer: Self-pay | Admitting: Neurology

## 2019-12-28 ENCOUNTER — Ambulatory Visit (INDEPENDENT_AMBULATORY_CARE_PROVIDER_SITE_OTHER): Payer: Medicare Other

## 2019-12-28 ENCOUNTER — Ambulatory Visit (INDEPENDENT_AMBULATORY_CARE_PROVIDER_SITE_OTHER): Payer: Medicare Other | Admitting: Orthopedic Surgery

## 2019-12-28 ENCOUNTER — Encounter: Payer: Self-pay | Admitting: Orthopedic Surgery

## 2019-12-28 ENCOUNTER — Ambulatory Visit (INDEPENDENT_AMBULATORY_CARE_PROVIDER_SITE_OTHER): Payer: Medicare Other | Admitting: Neurology

## 2019-12-28 ENCOUNTER — Other Ambulatory Visit: Payer: Self-pay

## 2019-12-28 ENCOUNTER — Ambulatory Visit: Payer: Self-pay

## 2019-12-28 VITALS — Ht 70.0 in | Wt 230.0 lb

## 2019-12-28 VITALS — BP 156/75 | HR 89 | Temp 97.4°F | Ht 71.0 in | Wt 235.0 lb

## 2019-12-28 DIAGNOSIS — F0789 Other personality and behavioral disorders due to known physiological condition: Secondary | ICD-10-CM

## 2019-12-28 DIAGNOSIS — G9389 Other specified disorders of brain: Secondary | ICD-10-CM

## 2019-12-28 DIAGNOSIS — M25561 Pain in right knee: Secondary | ICD-10-CM

## 2019-12-28 DIAGNOSIS — M25562 Pain in left knee: Secondary | ICD-10-CM | POA: Diagnosis not present

## 2019-12-28 DIAGNOSIS — M1712 Unilateral primary osteoarthritis, left knee: Secondary | ICD-10-CM

## 2019-12-28 DIAGNOSIS — R4689 Other symptoms and signs involving appearance and behavior: Secondary | ICD-10-CM | POA: Insufficient documentation

## 2019-12-28 DIAGNOSIS — G939 Disorder of brain, unspecified: Secondary | ICD-10-CM

## 2019-12-28 DIAGNOSIS — Z820 Family history of epilepsy and other diseases of the nervous system: Secondary | ICD-10-CM | POA: Diagnosis not present

## 2019-12-28 DIAGNOSIS — R4189 Other symptoms and signs involving cognitive functions and awareness: Secondary | ICD-10-CM | POA: Diagnosis not present

## 2019-12-28 DIAGNOSIS — E538 Deficiency of other specified B group vitamins: Secondary | ICD-10-CM | POA: Diagnosis not present

## 2019-12-28 DIAGNOSIS — R413 Other amnesia: Secondary | ICD-10-CM | POA: Diagnosis not present

## 2019-12-28 DIAGNOSIS — F0391 Unspecified dementia with behavioral disturbance: Secondary | ICD-10-CM | POA: Diagnosis not present

## 2019-12-28 DIAGNOSIS — G9340 Encephalopathy, unspecified: Secondary | ICD-10-CM | POA: Diagnosis not present

## 2019-12-28 DIAGNOSIS — G3109 Other frontotemporal dementia: Secondary | ICD-10-CM | POA: Diagnosis not present

## 2019-12-28 NOTE — Patient Instructions (Signed)
Need some blood testing today Will order a brain scan that can tell us if there is any dementia (FDG PET Scan)   Dementia Dementia is a condition that affects the way the brain functions. It often affects memory and thinking. Usually, dementia gets worse with time and cannot be reversed (progressive dementia). There are many types of dementia, including:  Alzheimer's disease. This type is the most common.  Vascular dementia. This type may happen as the result of a stroke.  Lewy body dementia. This type may happen to people who have Parkinson's disease.  Frontotemporal dementia. This type is caused by damage to nerve cells (neurons) in certain parts of the brain. Some people may be affected by more than one type of dementia. This is called mixed dementia. What are the causes? Dementia is caused by damage to cells in the brain. The area of the brain and the types of cells damaged determine the type of dementia. Usually, this damage is irreversible or cannot be undone. Some examples of irreversible causes include:  Conditions that affect the blood vessels of the brain, such as diabetes, heart disease, or blood vessel disease.  Genetic mutations. In some cases, changes in the brain may be caused by another condition and can be reversed or slowed. Some examples of reversible causes include:  Injury to the brain.  Certain medicines.  Infection, such as meningitis.  Metabolic problems, such as vitamin B12 deficiency or thyroid disease.  Pressure on the brain, such as from a tumor or blood clot. What are the signs or symptoms? Symptoms of dementia depend on the type of dementia. Common signs of dementia include problems with remembering, thinking, problem solving, decision making, and communicating. These signs develop slowly or get worse with time. This may include:  Problems remembering things.  Having trouble taking a bath or putting clothes on.  Forgetting  appointments.  Forgetting to pay bills.  Difficulty planning and preparing meals.  Having trouble speaking.  Getting lost easily. How is this diagnosed? This condition is diagnosed by a specialist (neurologist). It is diagnosed based on the history of your symptoms, your medical history, a physical exam, and tests. Tests may include:  Tests to evaluate brain function, such as memory tests, cognitive tests, and other tests.  Lab tests, such as blood or urine tests.  Imaging tests, such as a CT scan, a PET scan, or an MRI.  Genetic testing. This may be done if other family members have a diagnosis of certain types of dementia. Your health care provider will talk with you and your family, friends, or caregivers about your history and symptoms. How is this treated?  Treatment for this condition depends on the cause of the dementia. Progressive dementias, such as Alzheimer's disease, cannot be cured, but there may be treatments that help to manage symptoms. Treatment might involve taking medicines that may help to:  Control the dementia.  Slow down the progression of the dementia.  Manage symptoms. In some cases, treating the cause of your dementia can improve symptoms, reverse symptoms, or slow down how quickly your dementia becomes worse. Your health care provider can direct you to support groups, organizations, and other health care providers who can help with decisions about your care. Follow these instructions at home: Medicines  Take over-the-counter and prescription medicines only as told by your health care provider.  Use a pill organizer or pill reminder to help you manage your medicines.  Avoid taking medicines that can affect thinking, such as  pain medicines or sleeping medicines. Lifestyle  Make healthy lifestyle choices. ? Be physically active as told by your health care provider. ? Do not use any products that contain nicotine or tobacco, such as cigarettes,  e-cigarettes, and chewing tobacco. If you need help quitting, ask your health care provider. ? Do not drink alcohol. ? Practice stress-management techniques when you get stressed. ? Spend time with other people.  Make sure to get quality sleep. These tips can help you get a good night's rest: ? Avoid napping during the day. ? Keep your sleeping area dark and cool. ? Avoid exercising during the few hours before you go to bed. ? Avoid caffeine products in the evening. Eating and drinking  Drink enough fluid to keep your urine pale yellow.  Eat a healthy diet. General instructions   Work with your health care provider to determine what you need help with and what your safety needs are.  Talk with your health care provider about whether it is safe for you to drive.  If you were given a bracelet that identifies you as a person with memory loss or tracks your location, make sure to wear it at all times.  Work with your family to make important decisions, such as advance directives, medical power of attorney, or a living will.  Keep all follow-up visits as told by your health care provider. This is important. Where to find more information  Alzheimer's Association: CapitalMile.co.nz  National Institute on Aging: DVDEnthusiasts.nl  World Health Organization: RoleLink.com.br Contact a health care provider if:  You have any new or worsening symptoms.  You have problems with choking or swallowing. Get help right away if:  You feel depressed or sad, or feel that you want to harm yourself.  Your family members become concerned for your safety. If you ever feel like you may hurt yourself or others, or have thoughts about taking your own life, get help right away. You can go to your nearest emergency department or call:  Your local emergency services (911 in the [place]  A suicide crisis helpline, such as the [place] at 510-479-6938. This is open 24 hours  a day. Summary  Dementia is a condition that affects the way the brain functions. Dementia often affects memory and thinking.  Usually, dementia gets worse with time and cannot be reversed (progressive dementia).  Treatment for this condition depends on the cause of the dementia.  Work with your health care provider to determine what you need help with and what your safety needs are.  Your health care provider can direct you to support groups, organizations, and other health care providers who can help with decisions about your care. This information is not intended to replace advice given to you by your health care provider. Make sure you discuss any questions you have with your health care provider. Document Revised: 11/08/2018 Document Reviewed: 11/08/2018 Elsevier Patient Education  [place].

## 2019-12-28 NOTE — Progress Notes (Signed)
Office Visit Note   Patient: Thomas [name]           Date of Birth: 05-18-1946           MRN: [id number] Visit Date: 12/28/2019 Requested by: [name], [address] [place],  [place] [address] PCP: [name], MD  Subjective: Chief Complaint  Patient presents with  . Right Knee - Pain  . Left Knee - Pain    HPI: Thomas [name] is a patient with bilateral knee pain left worse than right.  I saw him about 3 years ago for right knee pain.  Had right total knee replacement done elsewhere.  No clear-cut reason why that right knee is painful.  Also describes left knee pain.  Has arthritis in that left knee.  Wants to be able to travel in the motor home with his wife but difficult for him to do that with his left knee pain.  He also has some other medical issues going on.  Monday after Easter he had a fall.  Became disoriented dizzy.  MRI scan was negative for stroke.  Had a knee injection just before that episode by about 2 or 3 days.  May or may not be related.  Seeing a neurologist later on today.  Flex agenic injection helped him some.  He is ambulating with a cane.  He was in pain management taking methadone and oxycodone but since his event over Easter he has been off pain medicine.              ROS: All systems reviewed are negative as they relate to the chief complaint within the history of present illness.  Patient denies  fevers or chills.   Assessment & Plan: Visit Diagnoses:  1. Pain in both knees, unspecified chronicity   2. Arthritis of left knee     Plan: Impression is bilateral knee pain left worse than right with left knee arthritis and no evidence of loosening or infection in the right knee.  Has it was 3 years ago I do not think there is really a clear-cut indication as to what is wrong with the right knee.  Sometimes these things are just painful and he will have to try to limit that as best he can.  Regarding the left knee he does have end-stage arthritis.  He is going to  consider cortisone injection in about 3 weeks before they go on a motorhome trip.  After that he will need to wait about 3 months before getting the left knee replaced.  Best for him to stay off the pain medicine in order to be able to use opioid narcotics for pain relief after surgery.  He does have some edema in his bilateral lower extremities but does not want to take Lasix because it makes him urinate too often.  For now I want him to find out from his neurologist if she thinks it is advisable for him to have a cortisone shot given his recent events which are yet unexplained.  If so that he will call me and we will get that set up for not sooner than 3 weeks from now.  Follow-Up Instructions: No follow-ups on file.   Orders:  Orders Placed This Encounter  Procedures  . XR Knee 1-2 Views Right  . XR KNEE 3 VIEW LEFT   No orders of the defined types were placed in this encounter.     Procedures: No procedures performed   Clinical Data: No additional  findings.  Objective: Vital Signs: Ht _0  (1.778 m)   Wt 230 lb (104.3 kg)   BMI 33.00 kg/m   Physical Exam:   Constitutional: Patient appears well-developed HEENT:  Head: Normocephalic Eyes:EOM are normal Neck: Normal range of motion Cardiovascular: Normal rate Pulmonary/chest: Effort normal Neurologic: Patient is alert Skin: Skin is warm Psychiatric: Patient has normal mood and affect    Ortho Exam: Ortho exam demonstrates full active and passive range of motion of the hips and ankle.  Right knee has flexion to about 95 degrees lacking 5 degrees of extension.  Extensor mechanism is intact.  No warmth to the knee no effusion.  No masses lymphadenopathy or skin changes noted in that right knee region.  Collaterals are stable to varus valgus stress at 0 39 degrees.  Left knee has slight varus alignment but no effusion.  Range of motion is to about 105 and 5 degree flexion contracture.  Extensor mechanism is intact.  2+  pitting edema present bilateral lower extremities but negative Homans and no calf tenderness.  Medial greater than lateral joint line tenderness is present.  Specialty Comments:  No specialty comments available.  Imaging: No results found.   PMFS History: Patient Active Problem List   Diagnosis Date Noted  . Tobacco abuse 12/11/2019  . Hypertension   . Acute metabolic encephalopathy   . Coronary artery disease 02/11/2018  . Claudication in peripheral vascular disease ([place]) 02/11/2018  . Atypical chest pain 01/12/2018  . Precordial chest pain 01/07/2018  . Coronary artery calcification 01/07/2018  . CKD (chronic kidney disease) stage 3, GFR 30-59 ml/min 11/25/2017  . Chronic diastolic heart failure ([place]) 05/13/2017  . Aortic stenosis, mild 05/13/2017  . Low back pain 05/12/2017  . GERD (gastroesophageal reflux disease) 05/12/2017  . Prostate nodule 05/12/2017  . Obesity 05/12/2017  . Depression 05/12/2017  . Hx of colonic polyps 05/12/2017  . 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 Type 2 diabetes mellitus ([place]) 05/12/2017  . Hyperlipidemia 05/12/2017  . Hypertensive heart disease with heart failure ([place]) 05/12/2017  . Osteoarthritis of knee 05/12/2017  . Sleep apnea 05/12/2017  . Actinic keratosis 05/12/2017  . 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 Seborrheic dermatitis 05/12/2017  . Paresthesia 05/12/2017   Past Medical History:  Diagnosis Date  . Actinic keratosis 05/12/2017  . Back pain   . Depression 05/12/2017  . Diabetes ([place])   . GERD (gastroesophageal reflux disease) 05/12/2017  . Hx of colonic polyps 05/12/2017  . Hyperlipidemia 05/12/2017  . Hypertension   . 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 Low back pain 05/12/2017  . Lumbar radiculopathy 05/12/2017  . 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 Obesity 05/12/2017  . Osteoarthritis of knee 05/12/2017  . Paresthesia 05/12/2017  . 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 Prostate nodule 05/12/2017  .  Seborrheic dermatitis 05/12/2017    Family History  Problem Relation Age of Onset  . Hypertension Mother   . Hypertension Father   . Alzheimer's disease Father     Past Surgical History:  Procedure Laterality Date  . HAND SURGERY    . REPLACEMENT TOTAL KNEE Right   . RIGHT/LEFT HEART CATH AND CORONARY ANGIOGRAPHY N/A 01/12/2018   Procedure: RIGHT/LEFT HEART CATH AND CORONARY ANGIOGRAPHY;  Surgeon: Belva Crome, MD;  Location: [facility];  Service: Cardiovascular;  Laterality: N/A;  . SHOULDER SURGERY Right    Social History   Occupational History  . Not on file  Tobacco Use  . Smoking  status: Current Every Day Smoker    Packs/day: 1.00    Years: 55.00    Pack years: 55.00  . Smokeless tobacco: Never Used  . Tobacco comment: Has cut back  Substance and Sexual Activity  . Alcohol use: No  . Drug use: No  . Sexual activity: Never    Birth control/protection: None

## 2019-12-28 NOTE — Progress Notes (Signed)
[facility]    Provider:  Dr Jaynee Eagles Requesting Provider: Vernell Leep MD (Neurohospitalist) Primary Care Provider:  [name], MD  CC:  Cognitive   HPI:  Thomas [name] is a 74 y.o. male here as requested by [facility] MD (Neurohospitalist) for neurocognitive assessment.  Past medical history B12 deficiency, type 2 diabetes hemoglobin A1c of 8.7 (uncontrolled), hypertension, hyperlipidemia, depression (Cymbalta was held in the hospital but was restarted at discharge).  I reviewed notes from: Hospitalization: Patient presented to [facility] with acute onset mental status changes, his mental status improved after admission but did not come back completely to baseline, and the overall etiology was not clear but decided multifactorial possibly related to baseline dementia, also B12 deficiency and possibly due to medications, CT of the head and MRIs of the brain did not show anything acute, urinalysis did not show UTI, EEG without seizure-like activity but showing generalized slowing, patient was discharged on B12 oral supplement and Depakote to 50 mg twice daily with a note in the discharge summary from neuro hospitalist (reviewed notes and summarized above) he will need neurocognitive assessment as an outpatient.  Also per hospitalization notes, very unreliable history of near syncope, no one is actually seen the patient have a syncopal spell not the spells are center neighbors, wife just thought he may be was about to pass out, patient was advised not to drive for 6 months episode free.  Neurology evaluated him and did try multiple times for lumbar puncture which was unobtainable however they do not think at this time that patient's episode was due to meningitis or encephalitis of any kind.  It appears he was on Depakote to 50 mg 24-hour tablet extended release by mouth 2 times daily prior to hospitalization earlier this month, he has been on duloxetine for depression, also  on methadone which he takes infrequently per notes.  He is here with his wife, some days he is good but some days he is a little ill-tempered. It was an acute change, he was "out of it", continued to be disoriented, he wasn't as mechanical as he usually was, he couldn't put a simple phone together. Sunday prior to admission he was confused and they took him to the hospital after he fell. Family noticed. No one has ever seen him fall however, poor historian. Wife gives most information. But ognitive issues have been ongoing, he doesn't know what the date is, he knows what the year is. His father had Alzheimers. He is tangential today, poor historian. Personality changes, argumentative. Wife says he can't be talked to, he "gets on a roll", no delusions or hallucinations. He has some short-term memory loss but they have never been that concerned with it. He went to the [place] and blood work was completed. No alcohol use, quit drinking many years ago.  He had a cortizone shot on Tuesday prior to the incident and he was hyperglycemic. Difficult to redirect.   Reviewed notes, labs and imaging from outside physicians, which showed:  MRI of the brain with minimal white matter changes and atrophy c/w age; essentially normal for age. Personally reviewed images.  Review of Systems: Patient complains of symptoms per HPI as well as the following symptoms irritability, chronic low back pain, chronic joint pain, mood changes. Pertinent negatives and positives per HPI. All others negative.   Social History   Socioeconomic History  . Marital status: Married    Spouse name: Not on file  . Number of children: Not on file  .  Years of education: Not on file  . Highest education level: Not on file  Occupational History  . Not on file  Tobacco Use  . Smoking status: Current Every Day Smoker    Packs/day: 1.00    Years: 55.00    Pack years: 55.00  . Smokeless tobacco: Never Used  . Tobacco comment: Has cut back    Substance and Sexual Activity  . Alcohol use: No  . Drug use: No  . Sexual activity: Never    Birth control/protection: None  Other Topics Concern  . Not on file  Social History Narrative   Lives at home with wife   Right handed   Social Determinants of Health   Financial Resource Strain:   . Difficulty of Paying Living Expenses:   Food Insecurity:   . Worried About Charity fundraiser in the Last Year:   . Arboriculturist in the Last Year:   Transportation Needs:   . Film/video editor (Medical):   [name] Lack of Transportation (Non-Medical):   Physical Activity:   . Days of Exercise per Week:   . Minutes of Exercise per Session:   Stress:   . Feeling of Stress :   Social Connections:   . Frequency of Communication with Friends and Family:   . Frequency of Social Gatherings with Friends and Family:   . Attends Religious Services:   . Active Member of Clubs or Organizations:   . Attends Archivist Meetings:   [name] Marital Status:   Intimate Partner Violence:   . Fear of Current or Ex-Partner:   . Emotionally Abused:   [name] Physically Abused:   . Sexually Abused:     Family History  Problem Relation Age of Onset  . Hypertension Mother   . Hypertension Father   . Alzheimer's disease Father     Past Medical History:  Diagnosis Date  . Actinic keratosis 05/12/2017  . Back pain   . Depression 05/12/2017  . Diabetes ([place])   . GERD (gastroesophageal reflux disease) 05/12/2017  . Hx of colonic polyps 05/12/2017  . Hyperlipidemia 05/12/2017  . Hypertension   . 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 Low back pain 05/12/2017  . Lumbar radiculopathy 05/12/2017  . 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 Obesity 05/12/2017  . Osteoarthritis of knee 05/12/2017  . Paresthesia 05/12/2017  . 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 Prostate nodule 05/12/2017  . Seborrheic dermatitis 05/12/2017    Patient Active Problem List   Diagnosis Date Noted  . Tobacco abuse 12/11/2019  . Hypertension   .  Acute metabolic encephalopathy   . Coronary artery disease 02/11/2018  . Claudication in peripheral vascular disease ([place]) 02/11/2018  . Atypical chest pain 01/12/2018  . Precordial chest pain 01/07/2018  . Coronary artery calcification 01/07/2018  . CKD (chronic kidney disease) stage 3, GFR 30-59 ml/min 11/25/2017  . Chronic diastolic heart failure ([place]) 05/13/2017  . Aortic stenosis, mild 05/13/2017  . Low back pain 05/12/2017  . GERD (gastroesophageal reflux disease) 05/12/2017  . Prostate nodule 05/12/2017  . Obesity 05/12/2017  . Depression 05/12/2017  . Hx of colonic polyps 05/12/2017  . 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 Type 2 diabetes mellitus ([place]) 05/12/2017  . Hyperlipidemia 05/12/2017  . Hypertensive heart disease with heart failure ([place]) 05/12/2017  . Osteoarthritis of knee 05/12/2017  . Sleep apnea 05/12/2017  . Actinic keratosis 05/12/2017  . 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 Seborrheic dermatitis 05/12/2017  .  Paresthesia 05/12/2017    Past Surgical History:  Procedure Laterality Date  . HAND SURGERY    . REPLACEMENT TOTAL KNEE Right   . RIGHT/LEFT HEART CATH AND CORONARY ANGIOGRAPHY N/A 01/12/2018   Procedure: RIGHT/LEFT HEART CATH AND CORONARY ANGIOGRAPHY;  Surgeon: Belva Crome, MD;  Location: [facility];  Service: Cardiovascular;  Laterality: N/A;  . SHOULDER SURGERY Right     Current Outpatient Medications  Medication Sig Dispense Refill  . aspirin 81 MG chewable tablet Chew 81 mg by mouth daily at 12 noon.     [name] atorvastatin (LIPITOR) 80 MG tablet Take 40 mg by mouth at bedtime.     . cetirizine (ZYRTEC) 10 MG tablet Take 10 mg by mouth daily as needed for allergies.    [name] diclofenac Sodium (VOLTAREN) 1 % GEL Apply 1 application topically 4 (four) times daily as needed (pain).    [name] divalproex (DEPAKOTE ER) 250 MG 24 hr tablet Take 1 tablet (250 mg total) by mouth 2 (two) times daily. 60 tablet 0  .  DULoxetine (CYMBALTA) 60 MG capsule Take 60 mg by mouth every evening.     [name] EPINEPHrine 0.3 mg/0.3 mL IJ SOAJ injection Inject 0.3 mg into the muscle daily as needed (for anaphylaxis).     . finasteride (PROSCAR) 5 MG tablet Take 5 mg by mouth daily.    [name] glipiZIDE (GLUCOTROL) 5 MG tablet Take 5-10 mg by mouth See admin instructions. Take 2 tablets (10 mg) by mouth daily with breakfast and 1 tablet (5 mg) daily with supper    . losartan (COZAAR) 25 MG tablet Take 25 mg by mouth in the morning and at bedtime. For blood pressure and kidney protection    . meloxicam (MOBIC) 15 MG tablet Take 7.5 mg by mouth 2 (two) times daily as needed for pain.     . metFORMIN (GLUCOPHAGE) 1000 MG tablet Take 1,000 mg by mouth 2 (two) times daily with a meal.     . omeprazole (PRILOSEC) 40 MG capsule Take 40 mg by mouth daily.    [name] terazosin (HYTRIN) 2 MG capsule Take 2 mg by mouth at bedtime.     . vitamin B-12 (CYANOCOBALAMIN) 1000 MCG tablet Take 1 tablet (1,000 mcg total) by mouth daily. 30 tablet 0  . methadone (DOLOPHINE) 5 MG tablet Take 5 mg by mouth every other day. For severe chronic pain    . nitroGLYCERIN (NITROSTAT) 0.4 MG SL tablet Place 1 tablet (0.4 mg total) under the tongue every 5 (five) minutes as needed for chest pain. 90 tablet 3  . oxyCODONE (OXY IR/ROXICODONE) 5 MG immediate release tablet Take 5 mg by mouth 4 (four) times daily as needed (severe chronic pain).      No current facility-administered medications for this visit.    Allergies as of 12/28/2019 - Review Complete 12/28/2019  Allergen Reaction Noted  . Wasp venom Anaphylaxis, Itching, and Swelling 05/12/2017    Vitals: BP (!) 156/75 (BP Location: Right Arm, Patient Position: Sitting)   Pulse 89   Temp (!) 97.4 F (36.3 C) Comment: 97, taken at front  Ht _0  (1.803 m)   Wt 235 lb (106.6 kg)   BMI 32.78 kg/m  Last Weight:  Wt Readings from Last 1 Encounters:  12/28/19 235 lb (106.6 kg)   Last Height:   Ht Readings  from Last 1 Encounters:  12/28/19 _1  (1.803 m)     Physical exam: Exam: Gen: tangential, irritable, difficult to  redirect                    CV: RRR, no MRG. No Carotid Bruits. No peripheral edema, warm, nontender Eyes: Conjunctivae clear without exudates or hemorrhage  Neuro: Detailed Neurologic Exam  Speech:    Speech is normal; fluent and spontaneous with normal comprehension.  Cognition:  MMSE - Mini Mental State Exam 12/28/2019  Orientation to time 4  Orientation to Place 5  Registration 3  Attention/ Calculation 4  Recall 1  Language- name 2 objects 2  Language- repeat 1  Language- follow 3 step command 3  Language- read & follow direction 1  Write a sentence 0  Copy design 0  Total score 24       The patient is oriented to person, place, and time;     recent and remote memory impaired ;     language fluent;     Impaired attention, concentration,     fund of knowledge Cranial Nerves:    The pupils are equal, round, and reactive to light.  Attempted funduscopy could not visualize due to small pupils. Visual fields are full to finger confrontation. Extraocular movements are intact. Trigeminal sensation is intact and the muscles of mastication are normal. The face is symmetric. The palate elevates in the midline. Hearing intact. Voice is normal. Shoulder shrug is normal. The tongue has normal motion without fasciculations.   Coordination:    Normal finger to nose  Gait:    Not parkinsonian and not ataxic, slightly wide based and lordotic but has large abdomen  Motor Observation:    No asymmetry, no atrophy, and no involuntary movements noted. Tone:    Normal muscle tone.    Posture:    Posture is normal. normal erect    Strength: prox LE weakness may be due to pain     Strength is V/V in the upper and lower limbs.      Sensation: intact to LT     Reflex Exam:  DTR's:    Deep tendon reflexes in the AJs are absent, declined patellars, 1+ uppers.    Toes:    The toes are equiv bilaterally.   Clonus:    Clonus is absent.    Assessment/Plan:  74 y.o. male here for neurocognitive assessment.  Past medical history B12 deficiency(recent B12 288), type 2 diabetes hemoglobin A1c of 8.7 (uncontrolled), hypertension, hyperlipidemia, depression Patient presented to [facility] with acute onset mental status changes, his mental status improved after admission but did not come back completely to baseline, and the overall etiology was not clear but decided multifactorial possibly related to baseline dementia, also B12 deficiency, hyperglycemia and possibly due to medications, CT of the head and MRIs of the brain did not show anything acute(MRI of the brain normal for age, personally reviewed images), urinalysis did not show UTI, EEG without seizure-like activity but showing generalized slowing,  1.  Patient with some concerning changes in recent years including changes in personality, irritability and outbursts, social withdrawal and lack of interest, doing things like visiting all the houses in the neighborhood and other unusual uninhibited behaviors.  I wonder if patient has late onset frontotemporal dementia.  He is also reporting short-term memory loss and his father had Alzheimer's dementia so this is a possibility as well.  I do think an FDG PET scan would help [place] differentiate between the 2, due to this patient's irritability, tangentiality and difficulty to redirect I do not think that he  could finish formal neurocognitive testing.  We will order FDG PET scan.  2.  His encephalopathy was in the setting of steroids of unknown dosage, injection, his encephalopathy may have been steroid-induced and/or due to his hyperglycemia or his B12 deficiency; any of these can affect the elderly especially with baseline dementia.   3. Will see them back in 3 months.   Orders Placed This Encounter  Procedures  . NM PET Metabolic Brain  . B12 and Folate Panel   . Methylmalonic acid, serum     Cc: Modena Jansky, MD,  [name], MD  Sarina Ill, MD  [facility] [address] [place], [place] [address]  Phone [phone] Fax [phone]  I spent 90 minutes of face-to-face and non-face-to-face time with patient on the  1. Acute encephalopathy   2. 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 Dementia with behavioral disturbance, unspecified dementia type ([place])   4. Cognitive decline   5. B12 deficiency   6. Short-term memory loss   7. FHx: Alzheimer's disease   8. Other frontotemporal dementia (CODE) ([place])    diagnosis.  This included previsit chart review, lab review, study review, order entry, electronic health record documentation, patient education on the different diagnostic and therapeutic options, counseling and coordination of care, risks and benefits of management, compliance, or risk factor reduction

## 2020-01-01 ENCOUNTER — Other Ambulatory Visit: Payer: Self-pay | Admitting: *Deleted

## 2020-01-01 ENCOUNTER — Other Ambulatory Visit: Payer: Self-pay

## 2020-01-01 ENCOUNTER — Encounter: Payer: Self-pay | Admitting: *Deleted

## 2020-01-01 DIAGNOSIS — G9341 Metabolic encephalopathy: Secondary | ICD-10-CM | POA: Diagnosis not present

## 2020-01-01 DIAGNOSIS — E1122 Type 2 diabetes mellitus with diabetic chronic kidney disease: Secondary | ICD-10-CM | POA: Diagnosis not present

## 2020-01-01 DIAGNOSIS — M1712 Unilateral primary osteoarthritis, left knee: Secondary | ICD-10-CM | POA: Diagnosis not present

## 2020-01-01 DIAGNOSIS — N183 Chronic kidney disease, stage 3 unspecified: Secondary | ICD-10-CM | POA: Diagnosis not present

## 2020-01-01 DIAGNOSIS — I5032 Chronic diastolic (congestive) heart failure: Secondary | ICD-10-CM | POA: Diagnosis not present

## 2020-01-01 DIAGNOSIS — I130 Hypertensive heart and chronic kidney disease with heart failure and stage 1 through stage 4 chronic kidney disease, or unspecified chronic kidney disease: Secondary | ICD-10-CM | POA: Diagnosis not present

## 2020-01-01 NOTE — Patient Outreach (Signed)
[facility]) Care Management  01/01/2020  Thomas [name] 1945-11-30 [id number]   EMMI-general discharge On APL Discharge from [place] Day #1 Date:Sunday December 17 2019 [place] Reason:Scheduled follow-up? No AND  Red on EMMI alert  Day #4 Date 12/20/19 1001  Red Alert Reason: Sad/hopeless/anxious/empty? Yes  Referral request verification ofprimary care provider (PCP)?Please verify PCP. [facility] has [name]? cardiology - on 01/01/20 updated [facility] CMA that pt is no longer seeing Agustin Cree since 2019  Epic list Thomas Janett Billow as [facility]MD  Insurance:NextGen MedicareandTricare for Life  [facility] admission on 12/11/19 to 12/15/19 - [facility] Dx acute metabolic encephalopathy  Successful outreach to the home number  Spoke with his wife, Thomas [name] as Thomas [name] hs been having confusion prior to last admission and is Hard of hearing [facility]) Thomas [name] is on the [employer] (DPR)She is able to verify HIPAA ([place]) identifiers, date of birth (DOB) and address Reviewed EMMI red alert referrals   EMMI Scheduled follow-up EMMI answer correct as Thomas Staples appointment with the Veteran's administration ([place]) on 12/22/19 Thomas [name] reports Thomas [name] sadness is because he voices concern about the need to have another knee procedure.  They deny medical concerns and needs at this time but do agree to further  [place] RN CM sent EMMI materials to the listed e mail in [place] sent EMMI information on hypertension, metabolic encephalopathy, corticosteroid joint injection, Diabetes Type 2 when you have depression and another health problem  Social Thomas [name] is a 74 year old veteran who lives with his wife Thomas [name]. He hs been married for 25 years.  Thomas [name] is followed by the [employer] administration He has used  the [employer] for transportation  He loves to travel with his wife in their motor home  Thomas [name] has medical issues of her own and uses a walker to ambulate. 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, depression (Cymbalta), obesity,Left knee pain/arthritis, current smoker  Fall Thomas Amadon denies a fall but Thomas [name] reports a fall the day after Easter (12/11/19) as noted in the ED visit note - He reports passing out spell in his driveway  DME: walker , cane shower seat, 3 n 1 cbg monitor   Medications: She denies concerns with taking medications as prescribed, affording medications, side effects of medications and questions about medications    Appointments: [facility] administration Thomas [name] (physically seen on 10/27/18- tele conference calls since- last on 12/22/19) or the "[facility]" MD covering her Thomas Arn MD last seen on 11/21/19 Thomas [name] seen 12/28/19 to follow up on his left  knee May 2021 Cataract procedure 04/04/20 Thomas Jaynee Eagles 3 month neurology follow up  Advance Directives: Has a will    Consent: [facility] RN CM reviewed [facility] services with patient. Patient gave verbal consent for services [facility] telephonic RN CM.   Plans  [facility] RN CM will follow up with Thomas & Thomas Schunk within the next 90-100 days as agree upon today  Pt encouraged to return a call to [facility] RN CM prn Routed note  to MD  [facility] CM Care Plan Problem One     Most Recent Value  Care Plan Problem One  knowledge of home care for HTN,  DM  Role Documenting the Problem One  Care Management Telephonic Coordinator  Care Plan  for Problem One  Active  [facility] Long Term Goal   over the next 100 days patient/wife will be able to verbalize action plan for home care of HTN & DM as verbalize during outreach  [facility] Long Term Goal Start Date  01/01/20  Interventions for Problem One Long Term Goal  assessed for home care needs, discussed orthopedic and neurology visit, assess home care of DM, sent EMMI materials to the listed e mail in [place] - materials sent EMMI information on hypertension, metabolic encephalopathy, corticosteroid joint injection, Diabetes Type 2 when you have depression and another health problem  [facility] CM Short Term Goal #1   over the next 30 days patient/family will be able to verbalize 2-3 symptoms to report to MDs as verbalize during outreach  [facility] CM Short Term Goal #1 Start Date  01/01/20  Interventions for Short Term Goal #1  sent EMMI materials to the listed e mail in [place] sent EMMI information on hypertension, metabolic encephalopathy, corticosteroid joint injection, Diabetes Type 2 when you have depression and another health problem      Luisa Louk L. Lavina Hamman, RN, BSN, [place] Coordinator Office number [phone] Mobile number [phone]  Main [facility]N number [phone] Fax number [phone]

## 2020-01-02 DIAGNOSIS — I5032 Chronic diastolic (congestive) heart failure: Secondary | ICD-10-CM | POA: Diagnosis not present

## 2020-01-02 DIAGNOSIS — M1712 Unilateral primary osteoarthritis, left knee: Secondary | ICD-10-CM | POA: Diagnosis not present

## 2020-01-02 DIAGNOSIS — G9341 Metabolic encephalopathy: Secondary | ICD-10-CM | POA: Diagnosis not present

## 2020-01-02 DIAGNOSIS — I130 Hypertensive heart and chronic kidney disease with heart failure and stage 1 through stage 4 chronic kidney disease, or unspecified chronic kidney disease: Secondary | ICD-10-CM | POA: Diagnosis not present

## 2020-01-02 DIAGNOSIS — N183 Chronic kidney disease, stage 3 unspecified: Secondary | ICD-10-CM | POA: Diagnosis not present

## 2020-01-02 DIAGNOSIS — E1122 Type 2 diabetes mellitus with diabetic chronic kidney disease: Secondary | ICD-10-CM | POA: Diagnosis not present

## 2020-01-02 LAB — METHYLMALONIC ACID, SERUM: Methylmalonic Acid: 154 nmol/L (ref 0–378)

## 2020-01-02 LAB — B12 AND FOLATE PANEL
Folate: 11.8 ng/mL (ref >3.0)
Vitamin B-12: 792 pg/mL (ref 232–1245)

## 2020-01-04 DIAGNOSIS — M1712 Unilateral primary osteoarthritis, left knee: Secondary | ICD-10-CM | POA: Diagnosis not present

## 2020-01-04 DIAGNOSIS — E1122 Type 2 diabetes mellitus with diabetic chronic kidney disease: Secondary | ICD-10-CM | POA: Diagnosis not present

## 2020-01-04 DIAGNOSIS — I5032 Chronic diastolic (congestive) heart failure: Secondary | ICD-10-CM | POA: Diagnosis not present

## 2020-01-04 DIAGNOSIS — I130 Hypertensive heart and chronic kidney disease with heart failure and stage 1 through stage 4 chronic kidney disease, or unspecified chronic kidney disease: Secondary | ICD-10-CM | POA: Diagnosis not present

## 2020-01-04 DIAGNOSIS — N183 Chronic kidney disease, stage 3 unspecified: Secondary | ICD-10-CM | POA: Diagnosis not present

## 2020-01-04 DIAGNOSIS — G9341 Metabolic encephalopathy: Secondary | ICD-10-CM | POA: Diagnosis not present

## 2020-01-08 DIAGNOSIS — I5032 Chronic diastolic (congestive) heart failure: Secondary | ICD-10-CM | POA: Diagnosis not present

## 2020-01-08 DIAGNOSIS — E1122 Type 2 diabetes mellitus with diabetic chronic kidney disease: Secondary | ICD-10-CM | POA: Diagnosis not present

## 2020-01-08 DIAGNOSIS — I130 Hypertensive heart and chronic kidney disease with heart failure and stage 1 through stage 4 chronic kidney disease, or unspecified chronic kidney disease: Secondary | ICD-10-CM | POA: Diagnosis not present

## 2020-01-08 DIAGNOSIS — M1712 Unilateral primary osteoarthritis, left knee: Secondary | ICD-10-CM | POA: Diagnosis not present

## 2020-01-08 DIAGNOSIS — G9341 Metabolic encephalopathy: Secondary | ICD-10-CM | POA: Diagnosis not present

## 2020-01-08 DIAGNOSIS — N183 Chronic kidney disease, stage 3 unspecified: Secondary | ICD-10-CM | POA: Diagnosis not present

## 2020-01-09 ENCOUNTER — Telehealth: Payer: Self-pay | Admitting: Neurology

## 2020-01-09 NOTE — Telephone Encounter (Signed)
Patient's wife called me back relayed all the details . Patient is diabetic and he takes his RX oral so he will hold off on RX until after PET scan . Patient;s wife will bring RX with the. Patient's wife understood all details of apt.

## 2020-01-09 NOTE — Telephone Encounter (Signed)
Called and left message for patient's wife and asked her to call me back about patient's PET's scan .  I have patient scheduled May 14 th at 9:00 am arrive at 8:30 am .  Patient needs to be NPO 6 hours before he goes for PET scan.  If patient is a diabetic  If patient takes oral he can take right after PET scan . If patient takes insulin the slow acting hold for 2- hours  If patient takes insulin the fast long acting hold for 8 hours. If patient is on Insulin pump do not stop.

## 2020-01-12 DIAGNOSIS — I5032 Chronic diastolic (congestive) heart failure: Secondary | ICD-10-CM | POA: Diagnosis not present

## 2020-01-12 DIAGNOSIS — E1122 Type 2 diabetes mellitus with diabetic chronic kidney disease: Secondary | ICD-10-CM | POA: Diagnosis not present

## 2020-01-12 DIAGNOSIS — G9341 Metabolic encephalopathy: Secondary | ICD-10-CM | POA: Diagnosis not present

## 2020-01-12 DIAGNOSIS — M1712 Unilateral primary osteoarthritis, left knee: Secondary | ICD-10-CM | POA: Diagnosis not present

## 2020-01-12 DIAGNOSIS — I130 Hypertensive heart and chronic kidney disease with heart failure and stage 1 through stage 4 chronic kidney disease, or unspecified chronic kidney disease: Secondary | ICD-10-CM | POA: Diagnosis not present

## 2020-01-12 DIAGNOSIS — N183 Chronic kidney disease, stage 3 unspecified: Secondary | ICD-10-CM | POA: Diagnosis not present

## 2020-01-15 DIAGNOSIS — E1122 Type 2 diabetes mellitus with diabetic chronic kidney disease: Secondary | ICD-10-CM | POA: Diagnosis not present

## 2020-01-15 DIAGNOSIS — G9341 Metabolic encephalopathy: Secondary | ICD-10-CM | POA: Diagnosis not present

## 2020-01-15 DIAGNOSIS — M1712 Unilateral primary osteoarthritis, left knee: Secondary | ICD-10-CM | POA: Diagnosis not present

## 2020-01-15 DIAGNOSIS — I130 Hypertensive heart and chronic kidney disease with heart failure and stage 1 through stage 4 chronic kidney disease, or unspecified chronic kidney disease: Secondary | ICD-10-CM | POA: Diagnosis not present

## 2020-01-15 DIAGNOSIS — N183 Chronic kidney disease, stage 3 unspecified: Secondary | ICD-10-CM | POA: Diagnosis not present

## 2020-01-15 DIAGNOSIS — I5032 Chronic diastolic (congestive) heart failure: Secondary | ICD-10-CM | POA: Diagnosis not present

## 2020-01-16 DIAGNOSIS — M1712 Unilateral primary osteoarthritis, left knee: Secondary | ICD-10-CM | POA: Diagnosis not present

## 2020-01-16 DIAGNOSIS — G9341 Metabolic encephalopathy: Secondary | ICD-10-CM | POA: Diagnosis not present

## 2020-01-16 DIAGNOSIS — N183 Chronic kidney disease, stage 3 unspecified: Secondary | ICD-10-CM | POA: Diagnosis not present

## 2020-01-16 DIAGNOSIS — E1122 Type 2 diabetes mellitus with diabetic chronic kidney disease: Secondary | ICD-10-CM | POA: Diagnosis not present

## 2020-01-16 DIAGNOSIS — I5032 Chronic diastolic (congestive) heart failure: Secondary | ICD-10-CM | POA: Diagnosis not present

## 2020-01-16 DIAGNOSIS — I130 Hypertensive heart and chronic kidney disease with heart failure and stage 1 through stage 4 chronic kidney disease, or unspecified chronic kidney disease: Secondary | ICD-10-CM | POA: Diagnosis not present

## 2020-01-17 DIAGNOSIS — M1712 Unilateral primary osteoarthritis, left knee: Secondary | ICD-10-CM | POA: Diagnosis not present

## 2020-01-17 DIAGNOSIS — E1122 Type 2 diabetes mellitus with diabetic chronic kidney disease: Secondary | ICD-10-CM | POA: Diagnosis not present

## 2020-01-17 DIAGNOSIS — I130 Hypertensive heart and chronic kidney disease with heart failure and stage 1 through stage 4 chronic kidney disease, or unspecified chronic kidney disease: Secondary | ICD-10-CM | POA: Diagnosis not present

## 2020-01-17 DIAGNOSIS — I5032 Chronic diastolic (congestive) heart failure: Secondary | ICD-10-CM | POA: Diagnosis not present

## 2020-01-17 DIAGNOSIS — N183 Chronic kidney disease, stage 3 unspecified: Secondary | ICD-10-CM | POA: Diagnosis not present

## 2020-01-17 DIAGNOSIS — G9341 Metabolic encephalopathy: Secondary | ICD-10-CM | POA: Diagnosis not present

## 2020-01-19 ENCOUNTER — Ambulatory Visit (HOSPITAL_COMMUNITY): Payer: Medicare Other

## 2020-01-20 DIAGNOSIS — I10 Essential (primary) hypertension: Secondary | ICD-10-CM | POA: Diagnosis not present

## 2020-01-23 ENCOUNTER — Encounter (HOSPITAL_COMMUNITY): Admission: RE | Admit: 2020-01-23 | Payer: Medicare Other | Source: Ambulatory Visit

## 2020-01-29 DIAGNOSIS — M79672 Pain in left foot: Secondary | ICD-10-CM | POA: Diagnosis not present

## 2020-01-29 DIAGNOSIS — E119 Type 2 diabetes mellitus without complications: Secondary | ICD-10-CM | POA: Diagnosis not present

## 2020-01-29 DIAGNOSIS — M79671 Pain in right foot: Secondary | ICD-10-CM | POA: Diagnosis not present

## 2020-01-29 DIAGNOSIS — B351 Tinea unguium: Secondary | ICD-10-CM | POA: Diagnosis not present

## 2020-01-31 DIAGNOSIS — H2513 Age-related nuclear cataract, bilateral: Secondary | ICD-10-CM | POA: Diagnosis not present

## 2020-01-31 DIAGNOSIS — H02834 Dermatochalasis of left upper eyelid: Secondary | ICD-10-CM | POA: Diagnosis not present

## 2020-01-31 DIAGNOSIS — H25041 Posterior subcapsular polar age-related cataract, right eye: Secondary | ICD-10-CM | POA: Diagnosis not present

## 2020-01-31 DIAGNOSIS — H02831 Dermatochalasis of right upper eyelid: Secondary | ICD-10-CM | POA: Diagnosis not present

## 2020-02-12 DIAGNOSIS — H2511 Age-related nuclear cataract, right eye: Secondary | ICD-10-CM | POA: Diagnosis not present

## 2020-02-12 DIAGNOSIS — H2513 Age-related nuclear cataract, bilateral: Secondary | ICD-10-CM | POA: Diagnosis not present

## 2020-02-27 DIAGNOSIS — H25811 Combined forms of age-related cataract, right eye: Secondary | ICD-10-CM | POA: Diagnosis not present

## 2020-02-27 DIAGNOSIS — H2181 Floppy iris syndrome: Secondary | ICD-10-CM | POA: Diagnosis not present

## 2020-02-27 DIAGNOSIS — H25041 Posterior subcapsular polar age-related cataract, right eye: Secondary | ICD-10-CM | POA: Diagnosis not present

## 2020-02-27 DIAGNOSIS — H2511 Age-related nuclear cataract, right eye: Secondary | ICD-10-CM | POA: Diagnosis not present

## 2020-04-01 ENCOUNTER — Ambulatory Visit: Payer: Self-pay | Admitting: *Deleted

## 2020-04-01 ENCOUNTER — Telehealth: Payer: Self-pay | Admitting: Neurology

## 2020-04-01 NOTE — Telephone Encounter (Signed)
[place]: Patient was scheduled for follow up to discuss FDG PET Scan but looks like he no-showed or canceled his appointment. So I can't really review the FDG PET Scan. Please call and ask him to schedule follow up with me after he has the FDG PET Scan. Alternatively we could send him to formal memory testing but I can't see him this Thursday to review testing if nothing has been completed.Hinton Dyer do you have any info on this?). Or possibly patient feels he is back to baseline and didn't need the testing, in that case follow up as needed.  thanks

## 2020-04-02 NOTE — Telephone Encounter (Signed)
Called and spoke to patient and his wife and he stated he is going to hold off on all apt's  PET Scan and follow up apt's . Patient's wife is in a wheel chair now because she hurt her back. Per patient he will call when he is ready to be rescheduled . Follow up CX with Dr. Jaynee Eagles.

## 2020-04-02 NOTE — Telephone Encounter (Signed)
Noted thanks °

## 2020-04-03 ENCOUNTER — Ambulatory Visit: Payer: Self-pay | Admitting: *Deleted

## 2020-04-04 ENCOUNTER — Ambulatory Visit: Payer: Medicare Other | Admitting: Neurology

## 2020-04-05 DIAGNOSIS — E119 Type 2 diabetes mellitus without complications: Secondary | ICD-10-CM | POA: Diagnosis not present

## 2020-04-05 DIAGNOSIS — M79671 Pain in right foot: Secondary | ICD-10-CM | POA: Diagnosis not present

## 2020-04-05 DIAGNOSIS — M79672 Pain in left foot: Secondary | ICD-10-CM | POA: Diagnosis not present

## 2020-04-05 DIAGNOSIS — B351 Tinea unguium: Secondary | ICD-10-CM | POA: Diagnosis not present

## 2020-05-05 ENCOUNTER — Emergency Department (HOSPITAL_BASED_OUTPATIENT_CLINIC_OR_DEPARTMENT_OTHER)
Admission: EM | Admit: 2020-05-05 | Discharge: 2020-05-05 | Disposition: A | Discharge status: Home / Self Care | Payer: No Typology Code available for payment source | Attending: Emergency Medicine | Admitting: Emergency Medicine

## 2020-05-05 ENCOUNTER — Encounter (HOSPITAL_BASED_OUTPATIENT_CLINIC_OR_DEPARTMENT_OTHER): Payer: Self-pay | Admitting: Emergency Medicine

## 2020-05-05 ENCOUNTER — Emergency Department (HOSPITAL_BASED_OUTPATIENT_CLINIC_OR_DEPARTMENT_OTHER): Payer: No Typology Code available for payment source

## 2020-05-05 DIAGNOSIS — N183 Chronic kidney disease, stage 3 unspecified: Secondary | ICD-10-CM | POA: Diagnosis not present

## 2020-05-05 DIAGNOSIS — I5032 Chronic diastolic (congestive) heart failure: Secondary | ICD-10-CM | POA: Diagnosis not present

## 2020-05-05 DIAGNOSIS — Z79899 Other long term (current) drug therapy: Secondary | ICD-10-CM | POA: Diagnosis not present

## 2020-05-05 DIAGNOSIS — E1122 Type 2 diabetes mellitus with diabetic chronic kidney disease: Secondary | ICD-10-CM | POA: Diagnosis not present

## 2020-05-05 DIAGNOSIS — Y92017 Garden or yard in single-family (private) house as the place of occurrence of the external cause: Secondary | ICD-10-CM | POA: Insufficient documentation

## 2020-05-05 DIAGNOSIS — W19XXXA Unspecified fall, initial encounter: Secondary | ICD-10-CM

## 2020-05-05 DIAGNOSIS — F17200 Nicotine dependence, unspecified, uncomplicated: Secondary | ICD-10-CM | POA: Diagnosis not present

## 2020-05-05 DIAGNOSIS — M79661 Pain in right lower leg: Secondary | ICD-10-CM | POA: Diagnosis not present

## 2020-05-05 DIAGNOSIS — W010XXA Fall on same level from slipping, tripping and stumbling without subsequent striking against object, initial encounter: Secondary | ICD-10-CM | POA: Diagnosis not present

## 2020-05-05 DIAGNOSIS — M79604 Pain in right leg: Secondary | ICD-10-CM | POA: Insufficient documentation

## 2020-05-05 DIAGNOSIS — Z7984 Long term (current) use of oral hypoglycemic drugs: Secondary | ICD-10-CM | POA: Insufficient documentation

## 2020-05-05 DIAGNOSIS — I2510 Atherosclerotic heart disease of native coronary artery without angina pectoris: Secondary | ICD-10-CM | POA: Insufficient documentation

## 2020-05-05 DIAGNOSIS — I130 Hypertensive heart and chronic kidney disease with heart failure and stage 1 through stage 4 chronic kidney disease, or unspecified chronic kidney disease: Secondary | ICD-10-CM | POA: Insufficient documentation

## 2020-05-05 DIAGNOSIS — Y9389 Activity, other specified: Secondary | ICD-10-CM | POA: Insufficient documentation

## 2020-05-05 DIAGNOSIS — Z7982 Long term (current) use of aspirin: Secondary | ICD-10-CM | POA: Insufficient documentation

## 2020-05-05 NOTE — ED Provider Notes (Cosign Needed)
[facility] EMERGENCY DEPARTMENT Provider Note   CSN: [id number] Arrival date & time: 05/05/20  6333     History Chief Complaint  Patient presents with  . Leg Pain    Thomas [name] is a 74 y.o. male with PMH significant for type II DM, HTN, HLD, and mild dementia presents the ED with complaints of right leg pain.  Patient reports that he had tripped in the garden and fell into a trench.  This occurred approximately 1 week ago and he has since been walking around without too much difficulty.  However, his wife is sick and so he has been doing much of the grocery shopping.  He states that with extended walking he develops worsening right-sided leg pain.  He also has mild swelling in his legs bilaterally and his wife described his legs as appearing "shiny".  Patient also endorses a history of peripheral neuropathy due to his diabetes.  He states that he has previously walked around on a broken ankle in the past and is wanting to obtain plain films to assess for fracture here in the ED.  He is endorsing pain down his entire right leg.  He denies any fevers or chills, new numbness or weakness, wounds, bruising, asymmetric swelling, cold extremity, or other symptoms.  HPI     Past Medical History:  Diagnosis Date  . Actinic keratosis 05/12/2017  . Back pain   . Depression 05/12/2017  . Diabetes ([place])   . GERD (gastroesophageal reflux disease) 05/12/2017  . Hx of colonic polyps 05/12/2017  . Hyperlipidemia 05/12/2017  . Hypertension   . 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 Low back pain 05/12/2017  . Lumbar radiculopathy 05/12/2017  . 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 Obesity 05/12/2017  . Osteoarthritis of knee 05/12/2017  . Paresthesia 05/12/2017  . 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 Prostate nodule 05/12/2017  . Seborrheic dermatitis 05/12/2017    Patient Active Problem List   Diagnosis Date Noted  . Cognitive and behavioral changes 12/28/2019  . Tobacco abuse 12/11/2019  . Hypertension   .  Acute metabolic encephalopathy   . Coronary artery disease 02/11/2018  . Claudication in peripheral vascular disease ([place]) 02/11/2018  . Atypical chest pain 01/12/2018  . Precordial chest pain 01/07/2018  . Coronary artery calcification 01/07/2018  . CKD (chronic kidney disease) stage 3, GFR 30-59 ml/min 11/25/2017  . Chronic diastolic heart failure ([place]) 05/13/2017  . Aortic stenosis, mild 05/13/2017  . Low back pain 05/12/2017  . GERD (gastroesophageal reflux disease) 05/12/2017  . Prostate nodule 05/12/2017  . Obesity 05/12/2017  . Depression 05/12/2017  . Hx of colonic polyps 05/12/2017  . 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 Type 2 diabetes mellitus ([place]) 05/12/2017  . Hyperlipidemia 05/12/2017  . Hypertensive heart disease with heart failure ([place]) 05/12/2017  . Osteoarthritis of knee 05/12/2017  . Sleep apnea 05/12/2017  . Actinic keratosis 05/12/2017  . 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 Seborrheic dermatitis 05/12/2017  . Paresthesia 05/12/2017    Past Surgical History:  Procedure Laterality Date  . HAND SURGERY    . REPLACEMENT TOTAL KNEE Right   . RIGHT/LEFT HEART CATH AND CORONARY ANGIOGRAPHY N/A 01/12/2018   Procedure: RIGHT/LEFT HEART CATH AND CORONARY ANGIOGRAPHY;  Surgeon: Belva Crome, MD;  Location: [facility];  Service: Cardiovascular;  Laterality: N/A;  . SHOULDER SURGERY Right        Family History  Problem Relation Age of Onset  . Hypertension Mother   .  Hypertension Father   . Alzheimer's disease Father     Social History   Tobacco Use  . Smoking status: Current Every Day Smoker    Packs/day: 1.00    Years: 55.00    Pack years: 55.00  . Smokeless tobacco: Never Used  . Tobacco comment: Has cut back  Vaping Use  . Vaping Use: Never used  Substance Use Topics  . Alcohol use: No  . Drug use: No    Home Medications Prior to Admission medications   Medication Sig Start Date End Date Taking?  Authorizing Provider  aspirin 81 MG chewable tablet Chew 81 mg by mouth daily at 12 noon.     [provider]  atorvastatin (LIPITOR) 80 MG tablet Take 40 mg by mouth at bedtime.     [provider]  cetirizine (ZYRTEC) 10 MG tablet Take 10 mg by mouth daily as needed for allergies.    [provider]  diclofenac Sodium (VOLTAREN) 1 % GEL Apply 1 application topically 4 (four) times daily as needed (pain).    [provider]  divalproex (DEPAKOTE ER) 250 MG 24 hr tablet Take 1 tablet (250 mg total) by mouth 2 (two) times daily. 12/15/19   Hongalgi, Lenis Dickinson, MD  DULoxetine (CYMBALTA) 60 MG capsule Take 60 mg by mouth every evening.     [provider]  EPINEPHrine 0.3 mg/0.3 mL IJ SOAJ injection Inject 0.3 mg into the muscle daily as needed (for anaphylaxis).     [provider]  finasteride (PROSCAR) 5 MG tablet Take 5 mg by mouth daily.    [provider]  glipiZIDE (GLUCOTROL) 5 MG tablet Take 5-10 mg by mouth See admin instructions. Take 2 tablets (10 mg) by mouth daily with breakfast and 1 tablet (5 mg) daily with supper    [provider]  losartan (COZAAR) 25 MG tablet Take 25 mg by mouth in the morning and at bedtime. For blood pressure and kidney protection    [provider]  meloxicam (MOBIC) 15 MG tablet Take 7.5 mg by mouth 2 (two) times daily as needed for pain.     [provider]  metFORMIN (GLUCOPHAGE) 1000 MG tablet Take 1,000 mg by mouth 2 (two) times daily with a meal.     [provider]  methadone (DOLOPHINE) 5 MG tablet Take 5 mg by mouth every other day. For severe chronic pain    [provider]  nitroGLYCERIN (NITROSTAT) 0.4 MG SL tablet Place 1 tablet (0.4 mg total) under the tongue every 5 (five) minutes as needed for chest pain. 01/11/18 12/11/19  Park Liter, MD  omeprazole (PRILOSEC) 40 MG capsule Take 40 mg by mouth daily.    [provider]  oxyCODONE  (OXY IR/ROXICODONE) 5 MG immediate release tablet Take 5 mg by mouth 4 (four) times daily as needed (severe chronic pain).     [provider]  terazosin (HYTRIN) 2 MG capsule Take 2 mg by mouth at bedtime.     [provider]  vitamin B-12 (CYANOCOBALAMIN) 1000 MCG tablet Take 1 tablet (1,000 mcg total) by mouth daily. 12/15/19   Modena Jansky, MD    Allergies    Wasp venom  Review of Systems   Review of Systems  Physical Exam Updated Vital Signs BP (!) 138/99 (BP Location: Right Arm)   Pulse 80   Temp 99.3 F (37.4 C) (Oral)   Resp 20   Ht _0  (1.803 m)  Wt 108.9 kg   SpO2 97%   BMI 33.47 kg/m   Physical Exam  ED Results / Procedures / Treatments   Labs (all labs ordered are listed, but only abnormal results are displayed) Labs Reviewed - No data to display  EKG None  Radiology DG Tibia/Fibula Right  Result Date: 05/05/2020 CLINICAL DATA:  Fall with pain in the leg. EXAM: RIGHT TIBIA AND FIBULA - 2 VIEW COMPARISON:  Knee radiographs dated 06/29/2015. FINDINGS: The patient is status post a knee arthroplasty. The hardware is intact. A plantar calcaneal enthesophyte is noted. Soft tissues are unremarkable. IMPRESSION: No acute osseous injury. Electronically Signed   By: Zerita Boers M.D.   On: 05/05/2020 12:00   DG Foot Complete Right  Result Date: 05/05/2020 CLINICAL DATA:  Fall.  Tenderness.  Pain throughout right leg EXAM: RIGHT FOOT COMPLETE - 3+ VIEW COMPARISON:  None. FINDINGS: There is no evidence of fracture or dislocation. Degenerative changes noted at the first MTP joint. Mild dorsal soft tissue swelling noted. Plantar heel spur noted. IMPRESSION: 1. No acute bone abnormality. 2. Mild dorsal soft tissue swelling. Electronically Signed   By: Kerby Moors M.D.   On: 05/05/2020 11:58   DG Femur Min 2 Views Right  Result Date: 05/05/2020 CLINICAL DATA:  Fall.  Tenderness.  Leg pain. EXAM: RIGHT FEMUR 2 VIEWS COMPARISON:  None. FINDINGS:  Moderate degenerative changes are noted within the right hip. Previous right knee arthroplasty. There is no fracture or dislocation identified. Vascular calcifications identified within the left upper leg. IMPRESSION: 1. No acute findings. 2. Moderate right hip osteoarthritis. Electronically Signed   By: Kerby Moors M.D.   On: 05/05/2020 11:55    Procedures Procedures (including critical care time)  Medications Ordered in ED Medications - No data to display  ED Course  I have reviewed the triage vital signs and the nursing notes.  Pertinent labs & imaging results that were available during my care of the patient were reviewed by me and considered in my medical decision making (see chart for details).    MDM Rules/Calculators/A&P                          I reviewed patient's medical record and patient had bilateral arterial vascular ultrasound performed 03/07/2018 which demonstrated multiple areas of stenosis concerning for PVD, most notably 50 to 74% stenosis of the right superficial femoral artery.  Patient's history of bilateral lower extremity edema and "shiny" weeping description is consistent with a venous stasis dermatitis/venous insufficiency.  There are no overlying skin changes.  There is no asymmetric swelling or ecchymoses.  Peripheral pulses are intact and symmetric.  Sensation is intact throughout (grossly).  He is able to ambulate and is able to range his hips, knees, and ankles with strength intact against resistance.  Patient is simply requesting radiographs to assess for bony abnormality subsequent to his fall sustained several days ago.  Given that he is mildly tender over the areas, feel as though it is reasonable.  We will obtain plain films and refer to Dr. Marlou Sa, his orthopedist, for ongoing evaluation and management.  Do not feel as though laboratory is work-up is warranted given that he denies any signs of systemic illness.  There is no overlying skin changes suggestive  of cellulitis.  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   Plain films are personally reviewed and demonstrate no acute osseous abnormalities.  I cautioned him that there is always a potential  that we have missed fracture and to seek follow-up should his symptoms fail to improve with conservative therapy.  Patient is ambulating around the ER and stating that he is eager to leave.  He is mildly agitated because he has been here for a total of 2 hours.  Patient already has oxycodone and Voltaren gel at home which he can apply as needed for pain symptoms.  No additional prescription medications required.  Patient is driving home and was not given any narcotic medication here in the ED.  He states that he has oxycodone at home that he can take for symptoms discomfort.  Plan to follow-up with Dr. Marlou Sa.  Strict ED return precautions discussed.  Patient voices understanding is agreeable to the plan.  Final Clinical Impression(s) / ED Diagnoses Final diagnoses:  Right leg pain  Fall, initial encounter    Rx / DC Orders ED Discharge Orders    None       Corena Herter, PA-C 05/05/20 1208

## 2020-05-05 NOTE — Discharge Instructions (Addendum)
Your physical exam and imaging obtained here in the ED was reassuring.  Please read the attachment on therapy for routine care of minor injuries.  You may take your medications at home for pain, as needed.  Do not take oxycodone if you plan on driving.  I would like for you to follow-up with your primary care provider as well as your orthopedist, Dr. Marlou Sa, for ongoing evaluation management.  Return to the ED or seek immediate medical attention should you experience any new or worsening symptoms.

## 2020-05-05 NOTE — ED Triage Notes (Signed)
R leg pain from ankle to thigh for several days. States he possibly hurt it when he tripped in his garden.

## 2020-05-10 ENCOUNTER — Other Ambulatory Visit: Payer: Self-pay | Admitting: *Deleted

## 2020-05-10 NOTE — Patient Outreach (Signed)
[facility]) Care Management  05/10/2020  Thomas [name] 1946/01/06 [id number]   [facility] outreach to complex care pt [facility]  Thomas [name] "Thomas [name]" ws referred to [facility] on 12/18/19 for EMMI general after [facility] for Red Alert Reason Scheduled follow-up? No and v  Referral request verification ofprimary care provider (PCP)?Please verify PCP. APL has [name]? cardiology - on 01/01/20 updated [facility] CMA that pt is no longer seeing Agustin Cree since 2019  Epic list Dr Janett Billow as [facility]MD  Insurance:NextGen MedicareandTricare for Life  [facility] on 12/11/19 to 12/15/19 - [facility] Dx acute metabolic encephalopathy  Unsuccessful outreach  No answer. [facility] RN CM left HIPAA [facility] Portability and Accountability Act) compliant voicemail message along with CM's contact info.  [facility] RN CM has generally been speaking with his wife, Thomas [name] as Thomas Soulliere hs been having confusion prior to last admission and is Hard of hearing [facility]) Mrs Butt is on the [employer] (DPR )She hs been able to verify HIPAA ([place]) identifiers, date of birth (DOB) and address   Plan: [facility] RN CM left a message informing Thomas & Mrs Donahoe of the transfer of his case to . [facility] disease management/health coach services as directed Collaborated with [place] Lavina Hamman, RN, BSN, [place] Coordinator Office number [phone] Mobile number [phone]  Main [facility] number [phone] Fax number [phone]

## 2020-05-14 ENCOUNTER — Other Ambulatory Visit: Payer: Self-pay | Admitting: *Deleted

## 2020-06-06 ENCOUNTER — Other Ambulatory Visit: Payer: Self-pay | Admitting: *Deleted

## 2020-06-06 DIAGNOSIS — M48061 Spinal stenosis, lumbar region without neurogenic claudication: Secondary | ICD-10-CM | POA: Diagnosis not present

## 2020-06-06 DIAGNOSIS — M5416 Radiculopathy, lumbar region: Secondary | ICD-10-CM | POA: Diagnosis not present

## 2020-06-06 NOTE — Patient Outreach (Signed)
[place] [facility]) Care Management  06/06/2020  Thomas [name] May 15, 1946 [id number]  Unsuccessful outreach attempt made to patient. RN Health Coach left HIPAA compliant voicemail message along with her contact information.  Plan: RN Health Coach will call patient within the month of November.  Emelia Loron RN, BSN [place] [phone] [email or web address]

## 2020-06-07 DIAGNOSIS — M79672 Pain in left foot: Secondary | ICD-10-CM | POA: Diagnosis not present

## 2020-06-07 DIAGNOSIS — B351 Tinea unguium: Secondary | ICD-10-CM | POA: Diagnosis not present

## 2020-06-07 DIAGNOSIS — E119 Type 2 diabetes mellitus without complications: Secondary | ICD-10-CM | POA: Diagnosis not present

## 2020-06-07 DIAGNOSIS — M79671 Pain in right foot: Secondary | ICD-10-CM | POA: Diagnosis not present

## 2020-06-11 DIAGNOSIS — M48061 Spinal stenosis, lumbar region without neurogenic claudication: Secondary | ICD-10-CM | POA: Diagnosis not present

## 2020-07-09 DIAGNOSIS — M48061 Spinal stenosis, lumbar region without neurogenic claudication: Secondary | ICD-10-CM | POA: Diagnosis not present

## 2020-07-11 ENCOUNTER — Other Ambulatory Visit: Payer: Self-pay | Admitting: *Deleted

## 2020-07-11 NOTE — Patient Outreach (Signed)
[place] [facility]) Care Management  07/11/2020  Thomas [name] [birth year] [id number]  Outreach attempt to patient. No answer and unable to leave voicemail message due to voice mailbox is full.   Plan: RN Health Coach will call patient within the month of December and patient agrees to future outreach calls.   Thomas Loron RN, BSN [place] [phone] [name]

## 2020-07-17 ENCOUNTER — Ambulatory Visit: Payer: Self-pay | Admitting: *Deleted

## 2020-07-23 DIAGNOSIS — M48061 Spinal stenosis, lumbar region without neurogenic claudication: Secondary | ICD-10-CM | POA: Diagnosis not present

## 2020-07-23 DIAGNOSIS — M5416 Radiculopathy, lumbar region: Secondary | ICD-10-CM | POA: Diagnosis not present

## 2020-08-06 DIAGNOSIS — M5416 Radiculopathy, lumbar region: Secondary | ICD-10-CM | POA: Diagnosis not present

## 2020-08-09 ENCOUNTER — Encounter: Payer: Self-pay | Admitting: *Deleted

## 2020-08-09 NOTE — Telephone Encounter (Signed)
  This encounter was created in error - please disregard.

## 2020-08-19 ENCOUNTER — Other Ambulatory Visit: Payer: Self-pay | Admitting: *Deleted

## 2020-08-19 DIAGNOSIS — M79671 Pain in right foot: Secondary | ICD-10-CM | POA: Diagnosis not present

## 2020-08-19 DIAGNOSIS — M79672 Pain in left foot: Secondary | ICD-10-CM | POA: Diagnosis not present

## 2020-08-19 DIAGNOSIS — E119 Type 2 diabetes mellitus without complications: Secondary | ICD-10-CM | POA: Diagnosis not present

## 2020-08-19 DIAGNOSIS — B351 Tinea unguium: Secondary | ICD-10-CM | POA: Diagnosis not present

## 2020-08-19 NOTE — Patient Outreach (Signed)
[place] [facility]) Care Management  08/19/2020  Thomas [name] 03-19-1946 [id number]  Successful outreach attempt made to patient's wife Thomas [name]. Wife states that the patient is doing much better and at this time does not feel that [facility] services would be beneficial as he has many providers who give him comprehensive care.   Plan: RN Health Coach will close case.   Emelia Loron RN, BSN [place] [phone] [email or web address]

## 2020-11-06 DIAGNOSIS — H02834 Dermatochalasis of left upper eyelid: Secondary | ICD-10-CM | POA: Diagnosis not present

## 2020-11-06 DIAGNOSIS — H2512 Age-related nuclear cataract, left eye: Secondary | ICD-10-CM | POA: Diagnosis not present

## 2020-11-06 DIAGNOSIS — H43811 Vitreous degeneration, right eye: Secondary | ICD-10-CM | POA: Diagnosis not present

## 2020-11-06 DIAGNOSIS — H02831 Dermatochalasis of right upper eyelid: Secondary | ICD-10-CM | POA: Diagnosis not present

## 2020-11-06 DIAGNOSIS — H2181 Floppy iris syndrome: Secondary | ICD-10-CM | POA: Diagnosis not present

## 2020-11-12 ENCOUNTER — Institutional Professional Consult (permissible substitution): Payer: Medicare Other | Admitting: Neurology

## 2020-11-12 ENCOUNTER — Encounter: Payer: Self-pay | Admitting: Neurology

## 2020-11-13 ENCOUNTER — Encounter: Payer: Self-pay | Admitting: Neurology

## 2020-11-13 ENCOUNTER — Ambulatory Visit (INDEPENDENT_AMBULATORY_CARE_PROVIDER_SITE_OTHER): Payer: Medicare Other | Admitting: Neurology

## 2020-11-13 VITALS — BP 158/81 | HR 67 | Ht 71.0 in | Wt 258.0 lb

## 2020-11-13 DIAGNOSIS — G4719 Other hypersomnia: Secondary | ICD-10-CM | POA: Diagnosis not present

## 2020-11-13 DIAGNOSIS — F1120 Opioid dependence, uncomplicated: Secondary | ICD-10-CM | POA: Diagnosis not present

## 2020-11-13 DIAGNOSIS — F4310 Post-traumatic stress disorder, unspecified: Secondary | ICD-10-CM

## 2020-11-13 DIAGNOSIS — Z8659 Personal history of other mental and behavioral disorders: Secondary | ICD-10-CM

## 2020-11-13 DIAGNOSIS — Z9189 Other specified personal risk factors, not elsewhere classified: Secondary | ICD-10-CM

## 2020-11-13 DIAGNOSIS — E669 Obesity, unspecified: Secondary | ICD-10-CM | POA: Insufficient documentation

## 2020-11-13 DIAGNOSIS — R351 Nocturia: Secondary | ICD-10-CM

## 2020-11-13 NOTE — Patient Instructions (Signed)
Sleep Study, Adult A sleep study (polysomnogram) is a series of tests done while you are sleeping. A sleep study records your brain waves, heart rate, breathing rate, oxygen level, and eye and leg movements. A sleep study helps your health care provider:  See how well you sleep.  Diagnose a sleep disorder.  Determine how severe your sleep disorder is.  Create a plan to treat your sleep disorder. Your health care provider may recommend a sleep study if you:  Feel sleepy on most days.  Snore loudly while sleeping.  Have unusual behaviors while you sleep, such as walking.  Have brief periods in which you stop breathing during sleep (sleepapnea).  Fall asleep suddenly during the day (narcolepsy).  Have trouble falling asleep or staying asleep (insomnia).  Feel like you need to move your legs when trying to fall asleep (restless legs syndrome).  Move your legs by flexing and extending them regularly while asleep (periodic limb movement disorder).  Act out your dreams while you sleep (sleep behavior disorder).  Feel like you cannot move when you first wake up (sleep paralysis). What tests are part of a sleep study? Most sleep studies record the following during sleep:  Brain activity.  Eye movements.  Heart rate and rhythm.  Breathing rate and rhythm.  Blood-oxygen level.  Blood pressure.  Chest and belly movement as you breathe.  Arm and leg movements.  Snoring or other noises.  Body position. Where are sleep studies done? Sleep studies are done at sleep centers. A sleep center may be inside a hospital, office, or clinic. The room where you have the study may look like a hospital room or a hotel room. The health care providers doing the study may come in and out of the room during the study. Most of the time, they will be in another room monitoring your test as you sleep. How are sleep studies done? Most sleep studies are done during a normal period of time for a  full night of sleep. You will arrive at the study center in the evening and go home in the morning. Before the test  Bring your pajamas and toothbrush with you to the sleep study.  Do not have caffeine on the day of your sleep study.  Do not drink alcohol on the day of your sleep study.  Your health care provider will let you know if you should stop taking any of your regular medicines before the test. During the test  Round, sticky patches with sensors attached to recording wires (electrodes) are placed on your scalp, face, chest, and limbs.  Wires from all the electrodes and sensors run from your bed to a computer. The wires can be taken off and put back on if you need to get out of bed to go to the bathroom.  A sensor is placed over your nose to measure airflow.  A finger clip is put on your finger or ear to measure your blood oxygen level (pulse oximetry).  A belt is placed around your belly and a belt is placed around your chest to measure breathing movements.  If you have signs of the sleep disorder called sleep apnea during your test, you may get a treatment mask to wear for the second half of the night. ? The mask provides positive airway pressure (PAP) to help you breathe better during sleep. This may greatly improve your sleep apnea. ? You will then have all tests done again with the mask in place to  see if your measurements and recordings change.      After the test  A medical doctor who specializes in sleep will evaluate the results of your sleep study and share them with you and your primary health care provider.  Based on your results, your medical history, and a physical exam, you may be diagnosed with a sleep disorder, such as: ? Sleep apnea. ? Restless legs syndrome. ? Sleep-related behavior disorder. ? Sleep-related movement disorders. ? Sleep-related seizure disorders.  Your health care team will help determine your treatment options based on your diagnosis.  This may include: ? Improving your sleep habits (sleep hygiene). ? Wearing a continuous positive airway pressure (CPAP) or bi-level positive airway pressure (BPAP) mask. ? Wearing an oral device at night to improve breathing and reduce snoring. ? Taking medicines. Follow these instructions at home:  Take over-the-counter and prescription medicines only as told by your health care provider.  If you are instructed to use a CPAP or BPAP mask, make sure you use it nightly as directed.  Make any lifestyle changes that your health care provider recommends.  If you were given a device to open your airway while you sleep, use it only as told by your health care provider.  Do not use any tobacco products, such as cigarettes, chewing tobacco, and e-cigarettes. If you need help quitting, ask your health care provider.  Keep all follow-up visits as told by your health care provider. This is important. Summary  A sleep study (polysomnogram) is a series of tests done while you are sleeping. It shows how well you sleep.  Most sleep studies are done over one full night of sleep. You will arrive at the study center in the evening and go home in the morning.  If you have signs of the sleep disorder called sleep apnea during your test, you may get a treatment mask to wear for the second half of the night.  A medical doctor who specializes in sleep will evaluate the results of your sleep study and share them with your primary health care provider. This information is not intended to replace advice given to you by your health care provider. Make sure you discuss any questions you have with your health care provider. Document Revised: 09/29/2019 Document Reviewed: 09/21/2017 Elsevier Patient Education  2021 [employer].

## 2020-11-13 NOTE — Progress Notes (Signed)
SLEEP MEDICINE CLINIC    Provider:  Larey Seat, MD  Primary Care Physician:  [name], [place] [address]     Referring Provider: [facility]   Primary neurologist is Dr Percell Belt        Chief Complaint according to patient   Patient presents with:    . 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   The patient had the first sleep study in the year 2009 at the [place]  with a result of an AHI ( Apnea Hypopnea index)  of OSA and started on CPAP, but he could not tolerate.    Sleep relevant medical history: Nocturia, cervical spine DDD, ENT surgery.     Family medical /sleep history: No other family member on CPAP with OSA, insomnia, sleep walkers.    Social history:  Patient is retired from Sales executive, IT trainer, retired form the [employer] in [place],  and lives in a household with spouse.  Family status is married, either  spouse having adult children,  grandchildren.  Pets are not present. Tobacco use;  Cigarettes, 1 ppd, ETOH use ; none ,  Caffeine intake in form of Coffee( AM 3 cups ) Soda( 2 a week) Tea ( /) or  energy drinks. Regular exercise in form of walking , is in PT , walks with a cane.    Sleep habits are as follows: The patient's dinner time is between 6 PM. The patient goes to bed at 8-12 PM and continues to sleep for intervals of 2-3 hours, wakes for up to 6  bathroom breaks. (!).  The preferred sleep position is left sided with the support of 2 pillows.  Adjustable bed, 20 degrees.  Dreams are reportedly frequent/vivid.PTSD. wakes up from these.  6 AM is the usual rise time. The patient wakes up spontaneously.  He reports not feeling refreshed or restored in AM, with symptoms such as dry mouth,, and residual fatigue.  Naps are taken daily- frequently, lasting from 60-120 minutes and are more refreshing than nocturnal sleep.    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    How likely are you to doze in the following situations: 0 = not likely, 1 = slight chance, 2 = moderate chance, 3 = high chance   Sitting and Reading? Watching Television? Sitting inactive in a public place (theater or meeting)? As a passenger in a car for an hour without a break? Lying down in the afternoon when circumstances permit? Sitting and talking to someone? Sitting quietly after  lunch without alcohol? In a car, while stopped for a few minutes in traffic?   Total = 16/ 24 points   FSS endorsed at 54/ 63 points.   Social History   Socioeconomic History  . Marital status: Married    Spouse name: Thomas [name]  . Number of children: Not on file  . Years of education: Not on file  . Highest education level: Not on file  Occupational History  . Occupation: retired Nature conservation officer  Tobacco Use  . Smoking status: Current Every Day Smoker    Packs/day: 1.00    Years: 55.00    Pack years: 55.00  . Smokeless tobacco:  Never Used  . Tobacco comment: Has cut back  Vaping Use  . Vaping Use: Never used  Substance and Sexual Activity  . Alcohol use: No  . Drug use: No  . Sexual activity: Never    Birth control/protection: None  Other Topics Concern  . Not on file  Social History Narrative   Lives at home with wife   Right handed   Social Determinants of Health   Financial Resource Strain: Not on file  Food Insecurity: Not on file  Transportation Needs: No Transportation Needs  . Lack of Transportation (Medical): No  . Lack of Transportation (Non-Medical): No  Physical Activity: Not on file  Stress: Not on file  Social Connections: Not on file    Family History  Problem Relation Age of Onset  . Hypertension Mother   . Hypertension Father   . Alzheimer's disease Father     Past Medical History:  Diagnosis Date  . Actinic keratosis 05/12/2017  . Back pain   . Depression 05/12/2017  . Diabetes ([place])   . GERD (gastroesophageal reflux disease) 05/12/2017  . Hx of colonic polyps 05/12/2017  . Hyperlipidemia 05/12/2017  . Hypertension   . 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 Low back pain 05/12/2017  . Lumbar radiculopathy 05/12/2017  . 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 Obesity 05/12/2017  . Osteoarthritis of knee 05/12/2017  . Paresthesia 05/12/2017  . 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 Prostate nodule 05/12/2017  . Seborrheic dermatitis 05/12/2017    Past Surgical History:  Procedure Laterality Date  . HAND SURGERY    . REPLACEMENT TOTAL KNEE Right   . RIGHT/LEFT HEART CATH AND CORONARY ANGIOGRAPHY N/A 01/12/2018   Procedure: RIGHT/LEFT HEART CATH AND CORONARY ANGIOGRAPHY;  Surgeon: Belva Crome, MD;  Location: [facility];  Service: Cardiovascular;  Laterality: N/A;  . SHOULDER SURGERY Right      Current Outpatient Medications on File Prior to Visit  Medication Sig Dispense Refill  . amLODipine (NORVASC) 10 MG tablet TAKE ONE TABLET BY MOUTH EVERY DAY FOR BLOOD PRESSURE * NOTE: NEW DOSE *    .  aspirin 81 MG chewable tablet Chew 81 mg by mouth daily at 12 noon.     [name] atorvastatin (LIPITOR) 80 MG tablet Take 40 mg by mouth at bedtime.     . Calcium Carbonate-Vitamin D 600-400 MG-UNIT tablet Take 1 tablet by mouth 2 (two) times daily.    . cetirizine (ZYRTEC) 10 MG tablet Take 10 mg by mouth daily as needed for allergies.    [name] diclofenac Sodium (VOLTAREN) 1 % GEL Apply 1 application topically 4 (four) times daily as needed (pain).    . DULoxetine (CYMBALTA) 60 MG capsule Take 60 mg by mouth every evening.     [name] EPINEPHrine 0.3 mg/0.3 mL IJ SOAJ injection Inject 0.3 mg into the muscle  daily as needed (for anaphylaxis).     . finasteride (PROSCAR) 5 MG tablet Take 5 mg by mouth daily.    [name] glipiZIDE (GLUCOTROL) 5 MG tablet Take 5-10 mg by mouth See admin instructions. Take 2 tablets (10 mg) by mouth daily with breakfast and 1 tablet (5 mg) daily with supper    . lidocaine (LIDODERM) 5 % APPLY 1 PATCH TO SKIN EVERY DAY APPLY ONCE DAILY FOR UP TO 12 HOURS (12 HOURS ON THEN REMOVE FOR 12 HOURS) FOR BACK PAIN    . losartan (COZAAR) 100 MG tablet Take 100 mg by mouth daily. For blood pressure and kidney protection    . meloxicam (MOBIC) 15 MG tablet Take 7.5 mg by mouth 2 (two) times daily as needed for pain.     . metFORMIN (GLUCOPHAGE) 1000 MG tablet Take 1,000 mg by mouth 2 (two) times daily with a meal.     . methadone (DOLOPHINE) 5 MG tablet Take 5 mg by mouth every other day. For severe chronic pain    . omeprazole (PRILOSEC) 40 MG capsule Take 40 mg by mouth daily.    [name] oxyCODONE (OXY IR/ROXICODONE) 5 MG immediate release tablet Take 5 mg by mouth 4 (four) times daily as needed (severe chronic pain).     . tamsulosin (FLOMAX) 0.4 MG CAPS capsule TAKE ONE CAPSULE BY MOUTH EVERY NIGHT FOR URINATION/PROSTATE * REPLACES TERAZOSIN *    . tiZANidine (ZANAFLEX) 4 MG tablet TAKE ONE TABLET BY MOUTH EVERY EVENING AS NEEDED FOR MUSCLE SPASMS     No current facility-administered medications on file  prior to visit.    Allergies  Allergen Reactions  . Wasp Venom Anaphylaxis, Itching and Swelling  . Bee Venom     Physical exam:  Today's Vitals   11/13/20 1009  BP: (!) 158/81  Pulse: 67  Weight: 258 lb (117 kg)  Height: _0  (1.803 m)   Body mass index is 35.98 kg/m.   Wt Readings from Last 3 Encounters:  11/13/20 258 lb (117 kg)  05/05/20 240 lb (108.9 kg)  12/28/19 235 lb (106.6 kg)     Ht Readings from Last 3 Encounters:  11/13/20 _1  (1.803 m)  05/05/20 _2  (1.803 m)  12/28/19 _3  (1.803 m)      General: The patient is awake, alert and appears not in acute distress. The patient has full facial hair.  Head: Normocephalic, atraumatic. Neck is supple. Mallampati  3  neck circumference:18 inches . Nasal airflow barely patent.  Retrognathia is not seen.  Dental status: facial hair, biological teeth.  Cardiovascular:  Regular rate and cardiac rhythm by pulse,  without distended neck veins. Respiratory: Lungs are clear to auscultation.  Skin:  With  evidence of ankle edema, and petechiae . Trunk: The patient's posture is erect.   Neurologic exam : The patient is awake and alert, oriented to place and time.   Memory subjective described as intact.  Attention span & concentration ability appears normal.  Speech is fluent,  without  dysarthria, dysphonia or aphasia.  Mood and affect are appropriate.   Cranial nerves: no loss of smell or taste reported  Pupils are equally small 2 mm- opioid pin point-  and briskly reactive to light. Funduscopic exam deferred. .  Extraocular movements in vertical and horizontal planes were intact and without nystagmus. No Diplopia. Visual fields by finger perimetry are intact. Hearing was intact to soft voice and finger rubbing.  Facial sensation intact to fine touch.  Facial motor strength is symmetric and tongue and uvula move midline.  Neck ROM : rotation, tilt and flexion extension were normal for age and shoulder shrug  was symmetrical.    Motor exam:  Symmetric bulk, tone and ROM. Upper extremities.    Normal tone without cog- wheeling, symmetric grip strength .   Sensory: deferred.   Coordination: Rapid alternating movements in the fingers/hands were of normal speed.  The Finger-to-nose maneuver was intact without evidence of ataxia, dysmetria or tremor.  Gait and station: Patient could rise unassisted from a seated position, he needs to brace, walks stooped and  walked with a cane  assistive device.  Toe and heel walk were deferred.  Deep tendon reflexes: in the  upper extremities are symmetric and intact. He lost the right patella reflexes. Babinski response was deferred.      After spending a total time of 60 minutes face to face and additional time for physical and neurologic examination, review of laboratory studies,  personal review of imaging studies, reports and results of other testing and review of referral information / records as far as provided in visit, I have established the following assessments:  1) I was concerned to hear that the patient was previously diagnosed with obstructive sleep apnea in 2009 but could not use CPAP and really it seems that nobody has worked with him to try to make it work.  At this time now 13 years later he suffers from excessive daytime sleepiness, nocturia, he is sometimes snoring but not always he gasps for air he frequently wakes up and contributing to this is as well PTSD as untreated apnea it seems.   He is also chronically treated with opiate pain medication for a failed knee replacement and chronic lower back pain.  This has been evident and pinpoint pupils, and excessive daytime sleepiness probably as well and poses a risk for for this patient who is also a smoker that he may have central sleep apnea.   So we are evaluating this gentleman for obstructive sleep apnea versus central sleep apnea versus overlap with COPD-hypoxemia.       My Plan is to proceed  with:  1) attended SPLIT night, AHI 30  SPLIT, Bed 2. Early arrival.   I would very much like for him to have an in lab sleep study.  I will ask him to get bed #2 which is an adjustable bed.  If he can go to sleep earlier in the day he should be arriving rest of the first shift.  I will order a split-night polysomnography.  I also would like to the technician to fit him for a mask it does not have to be a full facemask which triggered triggered some claustrophobia in the past if we do not find a perfect fit we may still find a mask that reduces his apnea.    I would like to thank [name], MD and [name], [place],  [place] [address] for allowing me to meet with and to take care of this pleasant patient.   In short, Thomas [name] is presenting with untreated apnea and hypersomnia.   CC: I will share my notes with Reading  Electronically signed by: Larey Seat, MD 11/13/2020 10:36 AM  [facility] Board certified by The [employer] of Sleep Medicine and Diplomate of the [employer] of Sleep Medicine. Board certified In Neurology through the [place], Fellow of the [employer] of  Neurology. Medical Director of [facility].

## 2020-11-14 ENCOUNTER — Other Ambulatory Visit: Payer: Self-pay | Admitting: Neurology

## 2020-11-14 ENCOUNTER — Telehealth: Payer: Self-pay

## 2020-11-14 DIAGNOSIS — F4310 Post-traumatic stress disorder, unspecified: Secondary | ICD-10-CM

## 2020-11-14 DIAGNOSIS — Z8659 Personal history of other mental and behavioral disorders: Secondary | ICD-10-CM

## 2020-11-14 DIAGNOSIS — R351 Nocturia: Secondary | ICD-10-CM

## 2020-11-14 DIAGNOSIS — Z9189 Other specified personal risk factors, not elsewhere classified: Secondary | ICD-10-CM

## 2020-11-14 DIAGNOSIS — G4719 Other hypersomnia: Secondary | ICD-10-CM

## 2020-11-14 NOTE — Telephone Encounter (Signed)
LVM (on both numbers) for pt to call me back to schedule sleep study

## 2020-11-15 DIAGNOSIS — M545 Low back pain, unspecified: Secondary | ICD-10-CM | POA: Diagnosis not present

## 2020-11-15 DIAGNOSIS — M25561 Pain in right knee: Secondary | ICD-10-CM | POA: Diagnosis not present

## 2020-11-15 DIAGNOSIS — M25562 Pain in left knee: Secondary | ICD-10-CM | POA: Diagnosis not present

## 2020-11-15 DIAGNOSIS — R262 Difficulty in walking, not elsewhere classified: Secondary | ICD-10-CM | POA: Diagnosis not present

## 2020-11-18 DIAGNOSIS — B351 Tinea unguium: Secondary | ICD-10-CM | POA: Diagnosis not present

## 2020-11-18 DIAGNOSIS — M545 Low back pain, unspecified: Secondary | ICD-10-CM | POA: Diagnosis not present

## 2020-11-18 DIAGNOSIS — M79672 Pain in left foot: Secondary | ICD-10-CM | POA: Diagnosis not present

## 2020-11-18 DIAGNOSIS — M25562 Pain in left knee: Secondary | ICD-10-CM | POA: Diagnosis not present

## 2020-11-18 DIAGNOSIS — E119 Type 2 diabetes mellitus without complications: Secondary | ICD-10-CM | POA: Diagnosis not present

## 2020-11-18 DIAGNOSIS — M25561 Pain in right knee: Secondary | ICD-10-CM | POA: Diagnosis not present

## 2020-11-18 DIAGNOSIS — M79671 Pain in right foot: Secondary | ICD-10-CM | POA: Diagnosis not present

## 2020-11-18 DIAGNOSIS — R262 Difficulty in walking, not elsewhere classified: Secondary | ICD-10-CM | POA: Diagnosis not present

## 2020-11-22 DIAGNOSIS — M25562 Pain in left knee: Secondary | ICD-10-CM | POA: Diagnosis not present

## 2020-11-22 DIAGNOSIS — R262 Difficulty in walking, not elsewhere classified: Secondary | ICD-10-CM | POA: Diagnosis not present

## 2020-11-22 DIAGNOSIS — M25561 Pain in right knee: Secondary | ICD-10-CM | POA: Diagnosis not present

## 2020-11-22 DIAGNOSIS — M545 Low back pain, unspecified: Secondary | ICD-10-CM | POA: Diagnosis not present

## 2020-11-25 DIAGNOSIS — R262 Difficulty in walking, not elsewhere classified: Secondary | ICD-10-CM | POA: Diagnosis not present

## 2020-11-25 DIAGNOSIS — M25562 Pain in left knee: Secondary | ICD-10-CM | POA: Diagnosis not present

## 2020-11-25 DIAGNOSIS — M545 Low back pain, unspecified: Secondary | ICD-10-CM | POA: Diagnosis not present

## 2020-11-25 DIAGNOSIS — M25561 Pain in right knee: Secondary | ICD-10-CM | POA: Diagnosis not present

## 2020-11-27 ENCOUNTER — Other Ambulatory Visit: Payer: Self-pay

## 2020-11-27 ENCOUNTER — Ambulatory Visit (INDEPENDENT_AMBULATORY_CARE_PROVIDER_SITE_OTHER): Payer: Medicare Other | Admitting: Neurology

## 2020-11-27 DIAGNOSIS — R351 Nocturia: Secondary | ICD-10-CM

## 2020-11-27 DIAGNOSIS — G4733 Obstructive sleep apnea (adult) (pediatric): Secondary | ICD-10-CM | POA: Diagnosis not present

## 2020-11-27 DIAGNOSIS — G4719 Other hypersomnia: Secondary | ICD-10-CM

## 2020-11-27 DIAGNOSIS — Z9189 Other specified personal risk factors, not elsewhere classified: Secondary | ICD-10-CM

## 2020-11-27 DIAGNOSIS — F4310 Post-traumatic stress disorder, unspecified: Secondary | ICD-10-CM

## 2020-11-27 DIAGNOSIS — Z8659 Personal history of other mental and behavioral disorders: Secondary | ICD-10-CM

## 2020-11-29 DIAGNOSIS — M545 Low back pain, unspecified: Secondary | ICD-10-CM | POA: Diagnosis not present

## 2020-11-29 DIAGNOSIS — M25562 Pain in left knee: Secondary | ICD-10-CM | POA: Diagnosis not present

## 2020-11-29 DIAGNOSIS — M25561 Pain in right knee: Secondary | ICD-10-CM | POA: Diagnosis not present

## 2020-11-29 DIAGNOSIS — R262 Difficulty in walking, not elsewhere classified: Secondary | ICD-10-CM | POA: Diagnosis not present

## 2020-12-02 DIAGNOSIS — R262 Difficulty in walking, not elsewhere classified: Secondary | ICD-10-CM | POA: Diagnosis not present

## 2020-12-02 DIAGNOSIS — M25561 Pain in right knee: Secondary | ICD-10-CM | POA: Diagnosis not present

## 2020-12-02 DIAGNOSIS — M25562 Pain in left knee: Secondary | ICD-10-CM | POA: Diagnosis not present

## 2020-12-02 DIAGNOSIS — M545 Low back pain, unspecified: Secondary | ICD-10-CM | POA: Diagnosis not present

## 2020-12-06 DIAGNOSIS — M25562 Pain in left knee: Secondary | ICD-10-CM | POA: Diagnosis not present

## 2020-12-06 DIAGNOSIS — M25561 Pain in right knee: Secondary | ICD-10-CM | POA: Diagnosis not present

## 2020-12-06 DIAGNOSIS — R262 Difficulty in walking, not elsewhere classified: Secondary | ICD-10-CM | POA: Diagnosis not present

## 2020-12-06 DIAGNOSIS — M545 Low back pain, unspecified: Secondary | ICD-10-CM | POA: Diagnosis not present

## 2020-12-09 NOTE — Procedures (Signed)
PATIENT'S NAME:  [name], Thomas [name] DOB:      27-Dec-1945      MR#:    [id number]     DATE OF RECORDING: 11/27/2020 REFERRING M[facility]D.:  [facility] MD Study Performed:   Baseline Polysomnogram HISTORY:  Thomas [name] is a 75- year -old [place] and seen here upon [place] referral on 11/13/2020 for a sleep apnea evaluation, he is excessively daytime sleepy. 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 He has known service- related conditions. He had been diagnosed with OSA but could not tolerate CPAP at the time, 8is very worried about PAP treatment. He has PTSD.   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    The patient had the first sleep study in the year 2009 at the [place] with a result of an AHI (Apnea Hypopnea index)  of OSA and started on CPAP, but he could not tolerate.   The patient endorsed the Epworth Sleepiness Scale at 16/24 points.   The patient's weight 258 pounds with a height of 71 (inches), resulting in a BMI of 36.1 kg/m2. The patient's neck circumference measured 18 inches.  CURRENT MEDICATIONS: Norvasc, Aspirin, Lipitor, Calcium carbonate-Vitamin D, Zyrtec, Voltaren, Cymbalta, Epinephrine, Proscar, Glucotrol, Lidoderm, Cozaar, Mobic, Glucophage, Dolophine, Prilosec, Oxycodone, Flomax, Zanaflex   PROCEDURE:  This is a multichannel digital polysomnogram utilizing the Somnostar 11.2 system.  Electrodes and sensors were  applied and monitored per AASM Specifications.   EEG, EOG, Chin and Limb EMG, were sampled at 200 Hz.  ECG, Snore and Nasal Pressure, Thermal Airflow, Respiratory Effort, CPAP Flow and Pressure, Oximetry was sampled at 50 Hz. Digital video and audio were recorded.      BASELINE STUDY: Lights Out was at 20:54 and Lights On at 04:51.  Total recording time (TRT) was 478 minutes, with a total sleep time (TST) of 233.5 minutes= under 4 hours.  The patient's sleep latency was 51 minutes.  REM latency was 385 minutes.  The sleep efficiency was only 48.8 %.     SLEEP ARCHITECTURE: WASO (Wake after sleep onset) was 169 minutes.  There were 30 minutes in Stage N1, 187.5 minutes Stage N2, 13.5 minutes Stage N3 and 2.5 minutes in Stage REM.  The percentage of Stage N1 was 12.8%, Stage N2 was 80.3%, Stage N3 was 5.8% and Stage R (REM sleep) was 1.1%.   RESPIRATORY ANALYSIS:  There were a total of 24 respiratory events:  10 obstructive apneas, 0 central apneas and 1 mixed apneas with a total of 11 apneas and an apnea index (AI) of 2.8 /hour. There were 13 hypopneas with a hypopnea index of 3.3 /hour.      The total APNEA/HYPOPNEA INDEX (AHI) was 6.2/hour.  1 event occurred in REM sleep and 25 events in NREM. The REM AHI was 24/hour, versus a non-REM AHI of 6/h. The patient spent 33.5 minutes of total sleep time in the supine position and 200 minutes in non-supine. The supine AHI was 9.0 versus a non-supine AHI of 5.7. Most apneas were seen in non-supine REM sleep.   OXYGEN SATURATION & C02:  The Wake baseline 02 saturation was 94%, with the lowest being 86%. Time spent below 89% saturation equaled 13 minutes.  The arousals were noted as: 83 were spontaneous, 2 were associated with PLMs, 11 were associated with respiratory events.  The patient had a total of 35 Periodic Limb Movements.  The Periodic Limb Movement (PLM) Arousal index was 0.5/hour.   Audio and video analysis did show restless sleep- but did not show  any abnormal or unusual movements, behaviors, phonations or vocalizations.   The patient took three bathroom- breaks. Snoring was noted. EKG with irregular R to R intervals.  Post-study, the patient indicated that sleep was the same as usual.    IMPRESSION:  1. Mild Obstructive Sleep Apnea (OSA). (The on first view apparent REM sleep dependency could not be established due to very little time in REM sleep at all) 2. 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 3. Nocturia.  4. Non-specific irregular EKG.    RECOMMENDATIONS:  1. Advise either a trial on CPAP or a referral for dental device or Inspire device can be offered to optimize therapy. The patient's BMI exceeds the Inspire criteria, so dental device may be the only way to avoid CPAP. Weight loss will help apnea and likely make other treatment obsolete!      I certify that I have reviewed the entire raw data recording prior to the issuance of this report in accordance with the Standards of Accreditation of the [employer] (AASM)   Larey Seat, MD Medical Director, [facility], [place] of Neurology and Sleep Medicine (Neurology and Sleep Medicine)

## 2020-12-09 NOTE — Progress Notes (Signed)
There was not enough apnea to SPLIT- this is mild OSA, and best treatment we ill be weight loss. CPAP is optional, dental device could be used for further reduction of apnea. He ios too heavy for INSPIRE device implant.  The patient has reported excessive daytime sleepiness and should be re-evaluated by Epworth score after 90 days on CPAP or on dental device.   CD   [facility] -

## 2020-12-10 ENCOUNTER — Encounter: Payer: Self-pay | Admitting: Neurology

## 2020-12-10 ENCOUNTER — Telehealth: Payer: Self-pay | Admitting: Neurology

## 2020-12-10 DIAGNOSIS — M25562 Pain in left knee: Secondary | ICD-10-CM | POA: Diagnosis not present

## 2020-12-10 DIAGNOSIS — M25561 Pain in right knee: Secondary | ICD-10-CM | POA: Diagnosis not present

## 2020-12-10 DIAGNOSIS — R262 Difficulty in walking, not elsewhere classified: Secondary | ICD-10-CM | POA: Diagnosis not present

## 2020-12-10 DIAGNOSIS — M545 Low back pain, unspecified: Secondary | ICD-10-CM | POA: Diagnosis not present

## 2020-12-10 NOTE — Telephone Encounter (Signed)
Called patient to discuss sleep study results. No answer at this time. LVM for the patient to call back.  Will send a mychart message as well.

## 2020-12-10 NOTE — Telephone Encounter (Signed)
-----  Message from Larey Seat, MD sent at 12/09/2020 11:09 AM EDT ----- There was not enough apnea to SPLIT- this is mild OSA, and best treatment we ill be weight loss. CPAP is optional, dental device could be used for further reduction of apnea. He ios too heavy for INSPIRE device implant.  The patient has reported excessive daytime sleepiness and should be re-evaluated by Epworth score after 90 days on CPAP or on dental device.   CD   [facility] -

## 2020-12-13 DIAGNOSIS — M25561 Pain in right knee: Secondary | ICD-10-CM | POA: Diagnosis not present

## 2020-12-13 DIAGNOSIS — M25562 Pain in left knee: Secondary | ICD-10-CM | POA: Diagnosis not present

## 2020-12-13 DIAGNOSIS — M545 Low back pain, unspecified: Secondary | ICD-10-CM | POA: Diagnosis not present

## 2020-12-13 DIAGNOSIS — R262 Difficulty in walking, not elsewhere classified: Secondary | ICD-10-CM | POA: Diagnosis not present

## 2020-12-16 DIAGNOSIS — R262 Difficulty in walking, not elsewhere classified: Secondary | ICD-10-CM | POA: Diagnosis not present

## 2020-12-16 DIAGNOSIS — M25562 Pain in left knee: Secondary | ICD-10-CM | POA: Diagnosis not present

## 2020-12-16 DIAGNOSIS — M25561 Pain in right knee: Secondary | ICD-10-CM | POA: Diagnosis not present

## 2020-12-16 DIAGNOSIS — M545 Low back pain, unspecified: Secondary | ICD-10-CM | POA: Diagnosis not present

## 2020-12-20 DIAGNOSIS — M25562 Pain in left knee: Secondary | ICD-10-CM | POA: Diagnosis not present

## 2020-12-20 DIAGNOSIS — R262 Difficulty in walking, not elsewhere classified: Secondary | ICD-10-CM | POA: Diagnosis not present

## 2020-12-20 DIAGNOSIS — M25561 Pain in right knee: Secondary | ICD-10-CM | POA: Diagnosis not present

## 2020-12-20 DIAGNOSIS — M545 Low back pain, unspecified: Secondary | ICD-10-CM | POA: Diagnosis not present

## 2020-12-23 DIAGNOSIS — M25562 Pain in left knee: Secondary | ICD-10-CM | POA: Diagnosis not present

## 2020-12-23 DIAGNOSIS — M545 Low back pain, unspecified: Secondary | ICD-10-CM | POA: Diagnosis not present

## 2020-12-23 DIAGNOSIS — M25561 Pain in right knee: Secondary | ICD-10-CM | POA: Diagnosis not present

## 2020-12-23 DIAGNOSIS — R262 Difficulty in walking, not elsewhere classified: Secondary | ICD-10-CM | POA: Diagnosis not present

## 2020-12-27 DIAGNOSIS — M25562 Pain in left knee: Secondary | ICD-10-CM | POA: Diagnosis not present

## 2020-12-27 DIAGNOSIS — M545 Low back pain, unspecified: Secondary | ICD-10-CM | POA: Diagnosis not present

## 2020-12-27 DIAGNOSIS — M25561 Pain in right knee: Secondary | ICD-10-CM | POA: Diagnosis not present

## 2020-12-27 DIAGNOSIS — R262 Difficulty in walking, not elsewhere classified: Secondary | ICD-10-CM | POA: Diagnosis not present

## 2020-12-30 DIAGNOSIS — M25562 Pain in left knee: Secondary | ICD-10-CM | POA: Diagnosis not present

## 2020-12-30 DIAGNOSIS — R262 Difficulty in walking, not elsewhere classified: Secondary | ICD-10-CM | POA: Diagnosis not present

## 2020-12-30 DIAGNOSIS — M25561 Pain in right knee: Secondary | ICD-10-CM | POA: Diagnosis not present

## 2020-12-30 DIAGNOSIS — M545 Low back pain, unspecified: Secondary | ICD-10-CM | POA: Diagnosis not present

## 2021-01-01 DIAGNOSIS — M5416 Radiculopathy, lumbar region: Secondary | ICD-10-CM | POA: Diagnosis not present

## 2021-01-01 DIAGNOSIS — M25562 Pain in left knee: Secondary | ICD-10-CM | POA: Diagnosis not present

## 2021-01-03 DIAGNOSIS — R262 Difficulty in walking, not elsewhere classified: Secondary | ICD-10-CM | POA: Diagnosis not present

## 2021-01-03 DIAGNOSIS — M25562 Pain in left knee: Secondary | ICD-10-CM | POA: Diagnosis not present

## 2021-01-03 DIAGNOSIS — M25561 Pain in right knee: Secondary | ICD-10-CM | POA: Diagnosis not present

## 2021-01-03 DIAGNOSIS — M545 Low back pain, unspecified: Secondary | ICD-10-CM | POA: Diagnosis not present

## 2021-01-10 DIAGNOSIS — M25561 Pain in right knee: Secondary | ICD-10-CM | POA: Diagnosis not present

## 2021-01-10 DIAGNOSIS — R262 Difficulty in walking, not elsewhere classified: Secondary | ICD-10-CM | POA: Diagnosis not present

## 2021-01-10 DIAGNOSIS — M25562 Pain in left knee: Secondary | ICD-10-CM | POA: Diagnosis not present

## 2021-01-10 DIAGNOSIS — M545 Low back pain, unspecified: Secondary | ICD-10-CM | POA: Diagnosis not present

## 2021-02-13 ENCOUNTER — Ambulatory Visit (INDEPENDENT_AMBULATORY_CARE_PROVIDER_SITE_OTHER): Payer: Medicare Other | Admitting: Neurology

## 2021-02-13 ENCOUNTER — Encounter: Payer: Self-pay | Admitting: Neurology

## 2021-02-13 VITALS — BP 162/78 | HR 71 | Ht 71.0 in | Wt 240.0 lb

## 2021-02-13 DIAGNOSIS — M25561 Pain in right knee: Secondary | ICD-10-CM

## 2021-02-13 DIAGNOSIS — G8929 Other chronic pain: Secondary | ICD-10-CM

## 2021-02-13 DIAGNOSIS — Z8659 Personal history of other mental and behavioral disorders: Secondary | ICD-10-CM | POA: Diagnosis not present

## 2021-02-13 DIAGNOSIS — G4719 Other hypersomnia: Secondary | ICD-10-CM | POA: Diagnosis not present

## 2021-02-13 DIAGNOSIS — Z9189 Other specified personal risk factors, not elsewhere classified: Secondary | ICD-10-CM | POA: Diagnosis not present

## 2021-02-13 NOTE — Progress Notes (Signed)
SLEEP MEDICINE CLINIC    Provider:  Larey Seat, MD  Primary Care Physician:  [name], [address] [address]     Referring Provider: [facility]   Primary neurologist is Dr Percell Belt        Chief Complaint according to patient   Patient presents with:     New Patient (Initial Visit)           HISTORY OF PRESENT ILLNESS:  Thomas [name] is a 75- year -old  Caucasian [employer] veteran and seen here upon [place] referral, and now in [place] on 02/13/2021 .  Mr. Troll underwent a sleep study at the lab on 3-23 2022.  He did have an AHI overall that was too low to need to be split.  After 2 hours of sleep time his AHI was only 3.5.  This is such mild apnea according to our sleep study that he would not need CPAP.  He did have a little intermittent leg jerking and some of these caused waking up from sleep.  His EKG of occasional shows PVCs premature ventricular complex contractions but no cardiac arrhythmia.  Was mostly interrupted his sleep for 3 bathroom breaks during the night.   He only slept under 4 hours- and I want to offer a repeat as a HST-  And thinks he sleeps better with CPAP. He still endorses a high level of sleepiness with an Epworth point score of 13, and his depression score is 7 out of 15 which is high.    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   The patient had the first sleep study in the year 2009 at the [place]  with a  result of an AHI ( Apnea Hypopnea index)  of OSA and started on CPAP, but he could not tolerate.    Sleep relevant medical history: Nocturia, cervical spine DDD, ENT surgery.     Family medical /sleep history: No other family member on CPAP with OSA, insomnia, sleep walkers.    Social history:  Patient is retired from Sales executive, IT trainer, retired form the [employer] in [place],  and lives in a household with spouse.  Family status is married, either  spouse having adult children,  grandchildren.  Pets are not present. Tobacco use;  Cigarettes, 1 ppd, ETOH use ; none ,  Caffeine intake in form of Coffee( AM 3 cups ) Soda( 2 a week) Tea ( /) or energy drinks. Regular exercise in form of walking , is in PT , walks with a cane.    Sleep habits are as follows: The patient's dinner time is between 6 PM. The patient goes to bed at 8-12 PM and continues to sleep for intervals of 2-3 hours, wakes for up to 6  bathroom breaks. (!).  The preferred sleep position is left sided with the support of 2 pillows.  Adjustable bed, 20 degrees.  Dreams are reportedly frequent/vivid.PTSD. wakes up from these.  6 AM is the usual rise time. The patient wakes up spontaneously.  He reports not feeling refreshed or restored in AM, with  symptoms such as dry mouth,, and residual fatigue.  Naps are taken daily- frequently, lasting from 60-120 minutes and are more refreshing than nocturnal sleep.    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    How likely are you to doze in the following situations: 0 = not likely, 1 = slight chance, 2 = moderate chance, 3 = high chance   Sitting and Reading? Watching Television? Sitting inactive in a public place (theater or meeting)? As a passenger in a car for an hour  without a break? Lying down in the afternoon when circumstances permit? Sitting and talking to someone? Sitting quietly after lunch without alcohol? In a car, while stopped for a few minutes in traffic?   Total = 16/ 24 points   FSS endorsed at 54/ 63 points.   Social History   Socioeconomic History   Marital status: Married    Spouse name: [name]   Number of children: Not on file   Years of education: Not on file   Highest education level: Not on file  Occupational History   Occupation: retired Nature conservation officer  Tobacco Use   Smoking status: Every Day    Packs/day: 1.00    Years: 55.00    Pack years: 55.00    Types: Cigarettes   Smokeless tobacco: Never   Tobacco comments:    Has cut back  Vaping Use   Vaping Use: Never used  Substance and Sexual Activity   Alcohol use: No   Drug use: No    Comment: takes prescriptions as prescribed: oxycodone and methadone   Sexual activity: Never    Birth control/protection: None  Other Topics Concern   Not on file  Social History Narrative   Lives at home with wife   Right handed   Caffeine: 2-4 cups/day   Social Determinants of Health   Financial Resource Strain: Not on file  Food Insecurity: Not on file  Transportation Needs: Not on file  Physical Activity: Not on file  Stress: Not on file  Social Connections: Not on file    Family History  Problem Relation Age of Onset   Hypertension Mother    Hypertension Father    Alzheimer's disease Father     Past Medical History:  Diagnosis Date   Actinic keratosis 05/12/2017   Back pain    Depression 05/12/2017   Diabetes ([place])    GERD (gastroesophageal reflux disease) 05/12/2017   Hx of colonic polyps 05/12/2017   Hyperlipidemia 05/12/2017   Hypertension    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/2017   Low back pain 05/12/2017   Lumbar radiculopathy 05/12/2017   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   Obesity 05/12/2017   Osteoarthritis of knee 05/12/2017   Paresthesia 05/12/2017   Peripheral sensory neuropathy  05/12/2017   Prostate nodule 05/12/2017   Seborrheic dermatitis 05/12/2017    Past Surgical History:  Procedure Laterality Date   HAND SURGERY     REPLACEMENT TOTAL KNEE Right    RIGHT/LEFT HEART CATH AND CORONARY ANGIOGRAPHY N/A 01/12/2018   Procedure: RIGHT/LEFT HEART CATH AND CORONARY ANGIOGRAPHY;  Surgeon: Belva Crome, MD;  Location: [facility];  Service: Cardiovascular;  Laterality: N/A;   SHOULDER SURGERY Right      Current Outpatient Medications on File Prior to Visit  Medication Sig Dispense Refill   amLODipine (NORVASC) 10  MG tablet TAKE ONE TABLET BY MOUTH EVERY DAY FOR BLOOD PRESSURE * NOTE: NEW DOSE *     aspirin 81 MG chewable tablet Chew 81 mg by mouth daily at 12 noon.      atorvastatin (LIPITOR) 80 MG tablet Take 40 mg by mouth at bedtime.      Calcium Carbonate-Vitamin D 600-400 MG-UNIT tablet Take 1 tablet by mouth 2 (two) times daily.     cetirizine (ZYRTEC) 10 MG tablet Take 10 mg by mouth daily as needed for allergies.     diclofenac Sodium (VOLTAREN) 1 % GEL Apply 1 application topically 4 (four) times daily as needed (pain).     DULoxetine (CYMBALTA) 60 MG capsule Take 60 mg by mouth every evening.      EPINEPHrine 0.3 mg/0.3 mL IJ SOAJ injection Inject 0.3 mg into the muscle daily as needed (for anaphylaxis).      finasteride (PROSCAR) 5 MG tablet Take 5 mg by mouth daily.     glipiZIDE (GLUCOTROL) 5 MG tablet Take 5-10 mg by mouth See admin instructions. Take 2 tablets (10 mg) by mouth daily with breakfast and 1 tablet (5 mg) daily with supper     losartan (COZAAR) 100 MG tablet Take 100 mg by mouth daily. For blood pressure and kidney protection     meloxicam (MOBIC) 15 MG tablet Take 7.5 mg by mouth 2 (two) times daily as needed for pain.      metFORMIN (GLUCOPHAGE) 1000 MG tablet Take 1,000 mg by mouth 2 (two) times daily with a meal.      methadone (DOLOPHINE) 5 MG tablet Take 5 mg by mouth every other day. For severe chronic pain     omeprazole  (PRILOSEC) 40 MG capsule Take 40 mg by mouth daily.     oxyCODONE (OXY IR/ROXICODONE) 5 MG immediate release tablet Take 5 mg by mouth 4 (four) times daily as needed (severe chronic pain).      tamsulosin (FLOMAX) 0.4 MG CAPS capsule TAKE ONE CAPSULE BY MOUTH EVERY NIGHT FOR URINATION/PROSTATE * REPLACES TERAZOSIN *     tiZANidine (ZANAFLEX) 4 MG tablet TAKE ONE TABLET BY MOUTH EVERY EVENING AS NEEDED FOR MUSCLE SPASMS     No current facility-administered medications on file prior to visit.    Allergies  Allergen Reactions   Wasp Venom Anaphylaxis, Itching and Swelling    Physical exam:  Today's Vitals   02/13/21 1046  BP: (!) 162/78  Pulse: 71  Weight: 240 lb (108.9 kg)  Height: _0  (1.803 m)   Body mass index is 33.47 kg/m.   Wt Readings from Last 3 Encounters:  02/13/21 240 lb (108.9 kg)  11/13/20 258 lb (117 kg)  05/05/20 240 lb (108.9 kg)     Ht Readings from Last 3 Encounters:  02/13/21 _1  (1.803 m)  11/13/20 _2  (1.803 m)  05/05/20 _3  (1.803 m)      General: The patient is awake, alert and appears not in acute distress. The patient has full facial hair.  Head: Normocephalic, atraumatic. Neck is supple. Mallampati  3  neck circumference:18 inches . Nasal airflow barely patent.  Retrognathia is not seen.  Dental status: facial hair, biological teeth.  Cardiovascular:  Regular rate and cardiac rhythm by pulse,  without distended neck veins. Respiratory: Lungs are clear to auscultation.  Skin:  With  evidence of ankle edema, and petechiae . Trunk: The patient's posture is erect.   Neurologic exam : The patient is awake and  alert, oriented to place and time.   Memory subjective described as intact.  Attention span & concentration ability appears normal.  Speech is fluent,  without  dysarthria, dysphonia or aphasia.  Mood and affect are appropriate.   Cranial nerves: no loss of smell or taste reported  Pupils are equally small 2 mm- opioid pin  point-  and briskly reactive to light. Funduscopic exam deferred. .   Facial sensation intact to fine touch. Facial motor strength is symmetric and tongue and uvula move midline.  Neck ROM : rotation, tilt and flexion extension were normal for age and shoulder shrug was symmetrical.    Motor exam:  Symmetric bulk, tone and ROM.    Sensory: deferred.   Coordination: Rapid alternating movements in the fingers/hands were of normal speed.  The Finger-to-nose maneuver was intact without evidence of ataxia, dysmetria or tremor.  Gait and station: Patient could rise unassisted from a seated position, he needs to brace, walks stooped and  walked with a cane  assistive device.  Toe and heel walk were deferred.  Deep tendon reflexes: in the  upper extremities are symmetric and intact. He lost the right patella reflexes. Babinski response was deferred.      After spending a total time of 20 minutes face to face and additional time for physical and neurologic examination, review of laboratory studies,  personal review of imaging studies, reports and results of other testing and review of referral information / records as far as provided in visit, I have established the following assessments:  1) our in lab test did not show enough apnea to justify CPAP order, he slept 3.9/h.   1) REPEAT as HST - I also would like to the technician to fit him for a mask it does not have to be a full facemask which triggered triggered some claustrophobia in the past if we do not find a perfect fit we may still find a mask that reduces his apnea.    I would like to thank [name], [address] [employer] [place] [employer],  [place] [address] for allowing me to meet with and to take care of this pleasant patient.   In short, KAMRAN COKER is presenting with untreated apnea and hypersomnia.   CC: I will share my notes with [place]  Electronically signed by: Larey Seat, MD 02/13/2021 11:18 AM  [facility]  [employer] Board certified by The [employer] of Sleep Medicine and Diplomate of the [employer] of Sleep Medicine. Board certified In Neurology through the [place], Fellow of the [employer] of Neurology. Medical Director of [employer].

## 2021-02-17 ENCOUNTER — Telehealth: Payer: Self-pay | Admitting: Neurology

## 2021-02-17 DIAGNOSIS — E119 Type 2 diabetes mellitus without complications: Secondary | ICD-10-CM | POA: Diagnosis not present

## 2021-02-17 DIAGNOSIS — B351 Tinea unguium: Secondary | ICD-10-CM | POA: Diagnosis not present

## 2021-02-17 DIAGNOSIS — M79671 Pain in right foot: Secondary | ICD-10-CM | POA: Diagnosis not present

## 2021-02-17 DIAGNOSIS — M79672 Pain in left foot: Secondary | ICD-10-CM | POA: Diagnosis not present

## 2021-02-17 NOTE — Telephone Encounter (Signed)
Attempted to call the patient to discuss further if he is interested in pursuing treatment with CPAP. I called home number and was unable to LVM due to VM full. I attempted the cell and there was no answer. LVM asking for a call back.   Given that the pt had a in lab study which did confirm overall mild sleep apnea, insurance will not cover another study. They recommend that patient treats with auto CPAP. Dr Dohmeier states that given the epworth was 13 she thinks it is worth the patient at least trying CPAP to determine if he finds relief in daytime sleepiness.

## 2021-02-18 NOTE — Addendum Note (Signed)
Addended by: Darleen Crocker on: 02/18/2021 08:15 AM   Modules accepted: Orders

## 2021-02-19 NOTE — Telephone Encounter (Signed)
Called the pt to review the sleep study that was done in march. Pt had a office visit just last week and it was discussed repeating a ss due to concerns of not enough sleep data. After further review it was decided that the study would allow him to pursue auto CPAP based off the numbers that were found.  She would recommend moving forward with auto CPAP. I answered the questions that the patient had and he would like to discuss further with Dr Delice Lesch. I advised I would resend the sleep study to allow the patient to discuss with PCP. Pt will contact us for further orders if necessary

## 2021-04-16 ENCOUNTER — Other Ambulatory Visit: Payer: Self-pay

## 2021-04-16 ENCOUNTER — Telehealth: Payer: Self-pay | Admitting: Orthopedic Surgery

## 2021-04-16 ENCOUNTER — Ambulatory Visit (INDEPENDENT_AMBULATORY_CARE_PROVIDER_SITE_OTHER): Payer: Medicare Other | Admitting: Orthopedic Surgery

## 2021-04-16 ENCOUNTER — Ambulatory Visit: Payer: No Typology Code available for payment source | Admitting: Orthopedic Surgery

## 2021-04-16 DIAGNOSIS — Z96651 Presence of right artificial knee joint: Secondary | ICD-10-CM | POA: Diagnosis not present

## 2021-04-16 NOTE — Telephone Encounter (Signed)
Pt came in requesting a call back. Pt need to know why his appt for cancellation. [facility] is [facility]. Please call pt at [phone]. Pt might need another [facility] referral. Pt reschedule for next week. Pt states no one called and told him anything. Please call pt concerning this matter.

## 2021-04-17 LAB — CBC WITH DIFFERENTIAL/PLATELET
Absolute Monocytes: 493 cells/uL (ref 200–950)
Basophils Absolute: 68 cells/uL (ref 0–200)
Basophils Relative: 0.8 %
Eosinophils Absolute: 306 cells/uL (ref 15–500)
Eosinophils Relative: 3.6 %
HCT: 40.8 % (ref 38.5–50.0)
Hemoglobin: 13.9 g/dL (ref 13.2–17.1)
Lymphs Abs: 1275 cells/uL (ref 850–3900)
MCH: 31.5 pg (ref 27.0–33.0)
MCHC: 34.1 g/dL (ref 32.0–36.0)
MCV: 92.5 fL (ref 80.0–100.0)
MPV: 10 fL (ref 7.5–12.5)
Monocytes Relative: 5.8 %
Neutro Abs: 6358 cells/uL (ref 1500–7800)
Neutrophils Relative %: 74.8 %
Platelets: 277 10*3/uL (ref 140–400)
RBC: 4.41 10*6/uL (ref 4.20–5.80)
RDW: 12.3 % (ref 11.0–15.0)
Total Lymphocyte: 15 %
WBC: 8.5 10*3/uL (ref 3.8–10.8)

## 2021-04-17 LAB — C-REACTIVE PROTEIN: CRP: 3 mg/L (ref <8.0)

## 2021-04-17 LAB — SEDIMENTATION RATE: Sed Rate: 19 mm/h (ref 0–20)

## 2021-04-17 NOTE — Progress Notes (Signed)
Labs neg for infxn pls claal thx

## 2021-04-17 NOTE — Progress Notes (Signed)
Patient wife aware of the below lab results

## 2021-04-19 ENCOUNTER — Encounter: Payer: Self-pay | Admitting: Orthopedic Surgery

## 2021-04-19 NOTE — Progress Notes (Signed)
Office Visit Note   Patient: Thomas [name]           Date of Birth: 1945-10-30           MRN: [id number] Visit Date: 04/16/2021 Requested by: [name], MD [address] [place],  [place] [address] PCP: [name], MD  Subjective: Chief Complaint  Patient presents with   Right Knee - Pain    HPI: Thomas [name] is a 75 year old patient with painful right total knee replacement.  This was placed somewhere else.  He has had 4 months of pain.  Last visit demonstrated right greater than left knee pain.  He has left knee arthritis as well but the right knee which has been replaced is actually hurting him more.  Denies much in the way of groin pain.  His pain is not particularly described as radicular in nature as it is affecting primarily his knee.  He has fallen several times.  Notably he takes oxycodone 5 mg 3/day and also states he takes methadone at times.  That part of his story is a little murky but he is currently on a fair amount of pain medicine on a daily basis.  Denies any fevers or chills.              ROS: All systems reviewed are negative as they relate to the chief complaint within the history of present illness.  Patient denies  fevers or chills.   Assessment & Plan: Visit Diagnoses:  1. History of arthroplasty of right knee     Plan: Impression is right knee pain with no clear-cut etiology of the pain.  Previous radiographs demonstrate no hip arthritis on that side.  Does not really seem radicular in nature in terms of back pain or radiation down in a normal radicular pattern.  He really keen on surgery but I do not find a clear-cut reason for surgery at this time.  Plan is laboratory values to rule out infection.  At the time of this dictation they are negative.  Bone scan on the right to evaluate for loosening although radiographically everything appears to be intact.  This may just be a pain that he has to live with.  Follow-Up Instructions: Return if symptoms worsen or fail  to improve.   Orders:  Orders Placed This Encounter  Procedures   NM Bone Scan 3 Phase Lower Extremity   CBC with Differential   Sed Rate (ESR)   C-reactive protein   No orders of the defined types were placed in this encounter.     Procedures: No procedures performed   Clinical Data: No additional findings.  Objective: Vital Signs: There were no vitals taken for this visit.  Physical Exam:   Constitutional: Patient appears well-developed HEENT:  Head: Normocephalic Eyes:EOM are normal Neck: Normal range of motion Cardiovascular: Normal rate Pulmonary/chest: Effort normal Neurologic: Patient is alert Skin: Skin is warm Psychiatric: Patient has normal mood and affect   Ortho Exam: Ortho exam on that right leg demonstrates palpable pedal pulse.  No effusion or warmth in the right knee versus left.  No mid flexion instability.  Collaterals are stable.  Extensor mechanism is intact.  Pretty reasonable range of motion without crepitus passively on the right.  Specialty Comments:  No specialty comments available.  Imaging: No results found.   PMFS History: Patient Active Problem List   Diagnosis Date Noted   Excessive daytime sleepiness 11/13/2020   Narcotic habituation, continuous ([place]) 11/13/2020   At risk  for central sleep apnea 11/13/2020   History of claustrophobia 11/13/2020   Morbid obesity ([place]) 11/13/2020   Nocturia 11/13/2020   PTSD (post-traumatic stress disorder) 11/13/2020   Cognitive and behavioral changes 12/28/2019   Tobacco abuse 12/11/2019   Hypertension    Acute metabolic encephalopathy    Coronary artery disease 02/11/2018   Claudication in peripheral vascular disease ([place]) 02/11/2018   Atypical chest pain 01/12/2018   Precordial chest pain 01/07/2018   Coronary artery calcification 01/07/2018   CKD (chronic kidney disease) stage 3, GFR 30-59 ml/min (H[facility]) 11/25/2017   Chronic diastolic heart failure ([place]) 05/13/2017   Aortic stenosis,  mild 05/13/2017   Low back pain 05/12/2017   GERD (gastroesophageal reflux disease) 05/12/2017   Prostate nodule 05/12/2017   Obesity 05/12/2017   Depression 05/12/2017   Hx of colonic polyps 05/12/2017   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/2017   Type 2 diabetes mellitus ([place]) 05/12/2017   Hyperlipidemia 05/12/2017   Hypertensive heart disease with heart failure ([place]) 05/12/2017   Osteoarthritis of knee 05/12/2017   Sleep apnea 05/12/2017   Actinic keratosis 05/12/2017   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   Seborrheic dermatitis 05/12/2017   Paresthesia 05/12/2017   Past Medical History:  Diagnosis Date   Actinic keratosis 05/12/2017   Back pain    Depression 05/12/2017   Diabetes ([place])    GERD (gastroesophageal reflux disease) 05/12/2017   Hx of colonic polyps 05/12/2017   Hyperlipidemia 05/12/2017   Hypertension    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/2017   Low back pain 05/12/2017   Lumbar radiculopathy 05/12/2017   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   Obesity 05/12/2017   Osteoarthritis of knee 05/12/2017   Paresthesia 05/12/2017   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   Prostate nodule 05/12/2017   Seborrheic dermatitis 05/12/2017    Family History  Problem Relation Age of Onset   Hypertension Mother    Hypertension Father    Alzheimer's disease Father     Past Surgical History:  Procedure Laterality Date   HAND SURGERY     REPLACEMENT TOTAL KNEE Right    RIGHT/LEFT HEART CATH AND CORONARY ANGIOGRAPHY N/A 01/12/2018   Procedure: RIGHT/LEFT HEART CATH AND CORONARY ANGIOGRAPHY;  Surgeon: Belva Crome, MD;  Location: [facility];  Service: Cardiovascular;  Laterality: N/A;   SHOULDER SURGERY Right    Social History   Occupational History   Occupation: retired Nature conservation officer  Tobacco Use   Smoking status: Every Day    Packs/day: 1.00    Years: 55.00    Pack years: 55.00    Types: Cigarettes   Smokeless tobacco: Never   Tobacco comments:    Has cut back  Vaping Use    Vaping Use: Never used  Substance and Sexual Activity   Alcohol use: No   Drug use: No    Comment: takes prescriptions as prescribed: oxycodone and methadone   Sexual activity: Never    Birth control/protection: None

## 2021-04-23 ENCOUNTER — Ambulatory Visit: Payer: No Typology Code available for payment source | Admitting: Orthopedic Surgery

## 2021-04-25 ENCOUNTER — Encounter (HOSPITAL_COMMUNITY)
Admission: RE | Admit: 2021-04-25 | Discharge: 2021-04-25 | Disposition: A | Discharge status: Home / Self Care | Payer: Medicare Other | Source: Ambulatory Visit | Attending: Orthopedic Surgery | Admitting: Orthopedic Surgery

## 2021-04-25 ENCOUNTER — Other Ambulatory Visit: Payer: Self-pay

## 2021-04-25 DIAGNOSIS — Z96651 Presence of right artificial knee joint: Secondary | ICD-10-CM | POA: Insufficient documentation

## 2021-04-25 DIAGNOSIS — Z471 Aftercare following joint replacement surgery: Secondary | ICD-10-CM | POA: Diagnosis not present

## 2021-04-25 DIAGNOSIS — S8991XA Unspecified injury of right lower leg, initial encounter: Secondary | ICD-10-CM | POA: Diagnosis not present

## 2021-04-25 DIAGNOSIS — M1712 Unilateral primary osteoarthritis, left knee: Secondary | ICD-10-CM | POA: Diagnosis not present

## 2021-04-25 MED ORDER — TECHNETIUM TC 99M MEDRONATE IV KIT
20.0000 | PACK | Freq: Once | INTRAVENOUS | Status: AC | PRN
  Administered 2021-04-25: 20 via INTRAVENOUS

## 2021-04-28 ENCOUNTER — Telehealth: Payer: Self-pay | Admitting: Orthopedic Surgery

## 2021-04-28 NOTE — Telephone Encounter (Signed)
Pt's wife calling asking about an update with her husbands labs that he had done for Dr. Marlou Sa on Friday. Pt stated they are having computer problems and mychart is not working for them right now and just wanted a call back to go over everything and see if he needs to make an appt with Marlou Sa. The best call back number is [phone].

## 2021-04-29 NOTE — Telephone Encounter (Signed)
Attempted to contact patient however had to leave voicemail with scan results. If patient has any additional questions or concerns he is able to contact our office back. Office number was provided to the patient.

## 2021-04-29 NOTE — Telephone Encounter (Signed)
Scan ok neg for loosening pls claal thx

## 2021-04-30 ENCOUNTER — Telehealth: Payer: Self-pay | Admitting: Orthopedic Surgery

## 2021-04-30 DIAGNOSIS — M5416 Radiculopathy, lumbar region: Secondary | ICD-10-CM

## 2021-04-30 NOTE — Telephone Encounter (Signed)
Laboratory values negative for infection.  Bone scan negative for loosening.  Even though he has significant pain in that knee there is no real reason to revise it and in this clinical scenario it only has a small chance of giving him pain relief if no definitive reason for the pain can be found.  I cannot really find anything wrong with the knee in terms of things that we can treat.  I think it is a risky proposition to revise the knee in this circumstance where no clear-cut etiology for the pain can be found.  Please call thanks.  If he wants to talk to me just send the message back but I am not really inclined to recommend surgery for him at this point

## 2021-04-30 NOTE — Telephone Encounter (Signed)
Gel or steroid not indicated in total knee replacement.  I cannot find a reason for operative intervention in this knee.  I am not sure why the knee is hurting as much as it is.  I do not think knee revision is indicated in this patient but he could seek another opinion about that.  The only imaging that has not been done yet is imaging of his back but by history it does not really match his symptoms.  Please call thanks

## 2021-04-30 NOTE — Telephone Encounter (Signed)
Attempted to contact patient however wife was speaking on patients behalf and would like to know of any recommendations that patient could do to help with pain. States that he has completed six weeks of physical [facility] [place] through the [place]. Could any possible Gel or Steroid help?

## 2021-04-30 NOTE — Telephone Encounter (Signed)
Pt is wondering when he is going to get ready to schedule surgery?  CB [phone]

## 2021-04-30 NOTE — Telephone Encounter (Signed)
Please see message below. I do not see any surgery indicated from last office visit. Patient has been made aware of bone scan results as well.

## 2021-05-01 ENCOUNTER — Telehealth: Payer: Self-pay | Admitting: Orthopedic Surgery

## 2021-05-01 NOTE — Telephone Encounter (Signed)
Pt's wife called asking for a call back. She did not leave a reason only to say she has questions. Phone number is [phone].

## 2021-05-02 NOTE — Addendum Note (Signed)
Addended byLaurann [place] on: 05/02/2021 09:04 AM   Modules accepted: Orders

## 2021-05-02 NOTE — Telephone Encounter (Signed)
IC-see also other message.

## 2021-05-02 NOTE — Telephone Encounter (Signed)
thx

## 2021-05-13 DIAGNOSIS — M4727 Other spondylosis with radiculopathy, lumbosacral region: Secondary | ICD-10-CM | POA: Diagnosis not present

## 2021-05-13 DIAGNOSIS — M4807 Spinal stenosis, lumbosacral region: Secondary | ICD-10-CM | POA: Diagnosis not present

## 2021-05-13 DIAGNOSIS — M438X5 Other specified deforming dorsopathies, thoracolumbar region: Secondary | ICD-10-CM | POA: Diagnosis not present

## 2021-05-13 DIAGNOSIS — M5116 Intervertebral disc disorders with radiculopathy, lumbar region: Secondary | ICD-10-CM | POA: Diagnosis not present

## 2021-05-13 DIAGNOSIS — M4726 Other spondylosis with radiculopathy, lumbar region: Secondary | ICD-10-CM | POA: Diagnosis not present

## 2021-05-13 DIAGNOSIS — M48061 Spinal stenosis, lumbar region without neurogenic claudication: Secondary | ICD-10-CM | POA: Diagnosis not present

## 2021-05-13 DIAGNOSIS — M5117 Intervertebral disc disorders with radiculopathy, lumbosacral region: Secondary | ICD-10-CM | POA: Diagnosis not present

## 2021-05-13 DIAGNOSIS — M5115 Intervertebral disc disorders with radiculopathy, thoracolumbar region: Secondary | ICD-10-CM | POA: Diagnosis not present

## 2021-05-14 ENCOUNTER — Telehealth: Payer: Self-pay | Admitting: Orthopedic Surgery

## 2021-05-14 DIAGNOSIS — M5416 Radiculopathy, lumbar region: Secondary | ICD-10-CM

## 2021-05-14 NOTE — Telephone Encounter (Signed)
Pt called asking for update about his appt or referral has been sent to Dr. Ernestina Patches or Dr. Marlou Sa call him about his MRI. Pt phone number is [phone].

## 2021-05-14 NOTE — Telephone Encounter (Signed)
Does patient need referral to Dr Ernestina Patches? He has had MRI scan of his lumbar spine.

## 2021-05-14 NOTE — Telephone Encounter (Signed)
I will see the result of his MRI scan.  I think I was going to send him up there for physical therapy on the knee.  Can you let me know if there is a result on his L-spine MRI because as he has been ordered but not see the result thanks

## 2021-05-14 NOTE — Telephone Encounter (Signed)
Pt called and states he was suppose to have a referral upstairs? I'm not sure if hes talking about Ernestina Patches or what?   CB [phone]

## 2021-05-16 ENCOUNTER — Telehealth: Payer: Self-pay | Admitting: Orthopedic Surgery

## 2021-05-16 NOTE — Telephone Encounter (Signed)
See other note. This has been done.

## 2021-05-16 NOTE — Telephone Encounter (Signed)
I talked with Dr Marlou Sa. He reviewed scan. Would like patient to be referred for lumbar ESI. I have made referral and patient is aware.

## 2021-05-16 NOTE — Telephone Encounter (Signed)
See other note. This has been done. 

## 2021-05-16 NOTE — Telephone Encounter (Signed)
Pt called stating he is suppose to be referred somewhere. He is wanting to get some kind of relief. I seen dean states he was probably going to send him to therapy. Can you put the referral in?   CB [phone]

## 2021-05-29 ENCOUNTER — Other Ambulatory Visit: Payer: Self-pay

## 2021-05-29 ENCOUNTER — Ambulatory Visit: Payer: Self-pay

## 2021-05-29 ENCOUNTER — Ambulatory Visit (INDEPENDENT_AMBULATORY_CARE_PROVIDER_SITE_OTHER): Payer: Medicare Other | Admitting: Physical Medicine and Rehabilitation

## 2021-05-29 ENCOUNTER — Encounter: Payer: Self-pay | Admitting: Physical Medicine and Rehabilitation

## 2021-05-29 VITALS — BP 193/79 | HR 106

## 2021-05-29 DIAGNOSIS — M5416 Radiculopathy, lumbar region: Secondary | ICD-10-CM

## 2021-05-29 MED ORDER — BETAMETHASONE SOD PHOS & ACET 6 (3-3) MG/ML IJ SUSP
12.0000 mg | Freq: Once | INTRAMUSCULAR | Status: AC
  Administered 2021-05-29: 12 mg

## 2021-05-29 NOTE — Patient Instructions (Signed)
[facility] Discharge Instructions  *At any time if you have questions or concerns they can be answered by calling [phone]  All Patients: You may experience an increase in your symptoms for the first 2 days (it can take 2 days to 2 weeks for the steroid/cortisone to have its maximal effect). You may use ice to the site for the first 24 hours; 20 minutes on and 20 minutes off and may use heat after that time. You may resume and continue your current pain medications. If you need a refill please contact the prescribing physician. You may resume your medications if any were stopped for the procedure. You may shower but no swimming, tub bath or Jacuzzi for 24 hours. Please remove bandage after 4 hours. You may resume light activities as tolerated. If you had Spine Injection, you should not drive for the next 3 hours due to anesthetics used in the procedure. Please have someone drive for you.  *If you have had sedation, Valium, Xanax, or lorazepam: Do not drive or use public transportation for 24 hours, do not operating hazardous machinery or make important personal/business decisions for 24 hours.  POSSIBLE STEROID SIDE EFFECTS: If experienced these should only last for a short period. Change in menstrual flow  Edema in (swelling)  Increased appetite Skin flushing (redness)  Skin rash/acne  Thrush (oral) Vaginitis    Increased sweating  Depression Increased blood glucose levels Cramping and leg/calf  Euphoria (feeling happy)  POSSIBLE PROCEDURE SIDE EFFECTS: Please call our office if concerned. Increased pain Increased numbness/tingling  Headache Nausea/vomiting Hematoma (bruising/bleeding) Edema (swelling at the site) Weakness  Infection (red/drainage at site) Fever greater than 100.55F  *In the event of a headache after epidural steroid injection: Drink plenty of fluids, especially water and try to lay flat when possible. If the headache does not get better after a  few days or as always if concerned please call the office.

## 2021-05-29 NOTE — Progress Notes (Signed)
Pt state lower back pain that travels to his right inner and posterior thigh down to his knee and foot. Pt state any movement makes the pain worse. Pt state he takes pain meds and uses heating to help ease his pain.  Numeric Pain Rating Scale and Functional Assessment Average Pain 8   In the last MONTH (on 0-10 scale) has pain interfered with the following?  1. General activity like being  able to carry out your everyday physical activities such as walking, climbing stairs, carrying groceries, or moving a chair?  Rating(10)   +Driver, -BT, -Dye Allergies.

## 2021-06-03 IMAGING — CT CT HEAD W/O CM
4 series · 16 of 47 positions shown, 18 images · non-contrast
Comparison: None.

CLINICAL DATA: Ataxia with balance loss

EXAM:
CT HEAD WITHOUT CONTRAST
TECHNIQUE: Contiguous axial images were obtained from the base of the skull
through the vertex without intravenous contrast.

[Series 3: head without · axial · non-contrast · 0.44mm/px · z∈[-188,-68]mm · 7 of 33 slices shown, 9 images]
[im 5/33  brain]
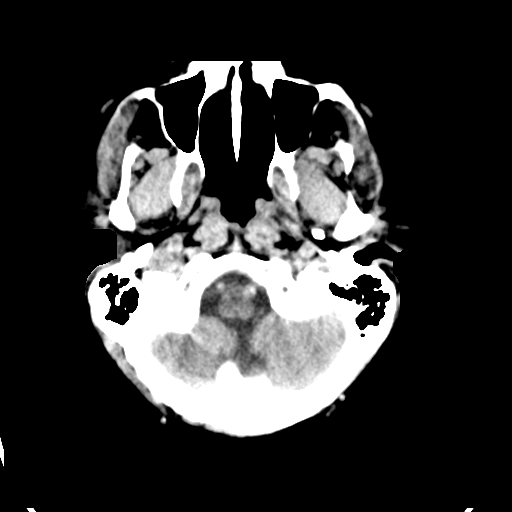
[im 5/33  bone]
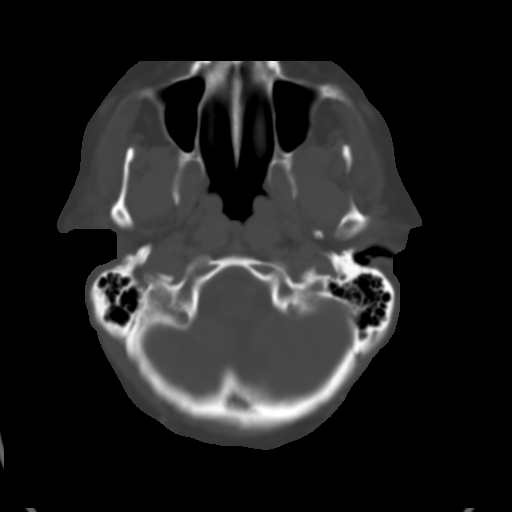
[im 9/33  brain]
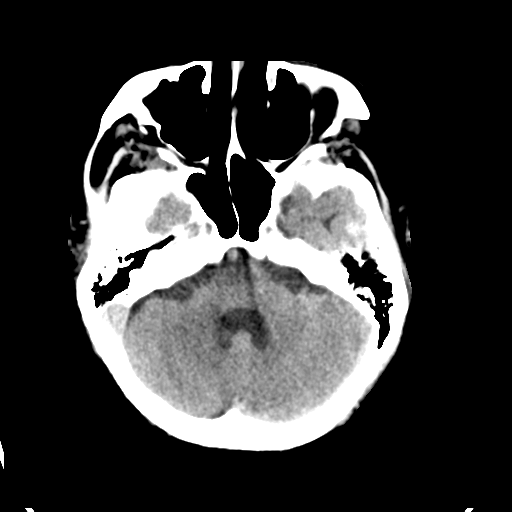
[im 13/33  brain]
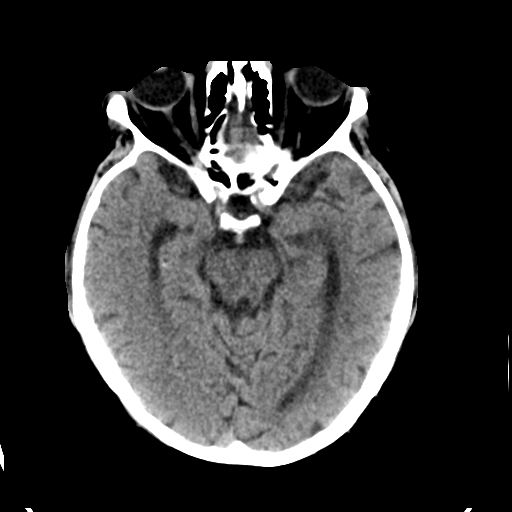
[im 17/33  brain]
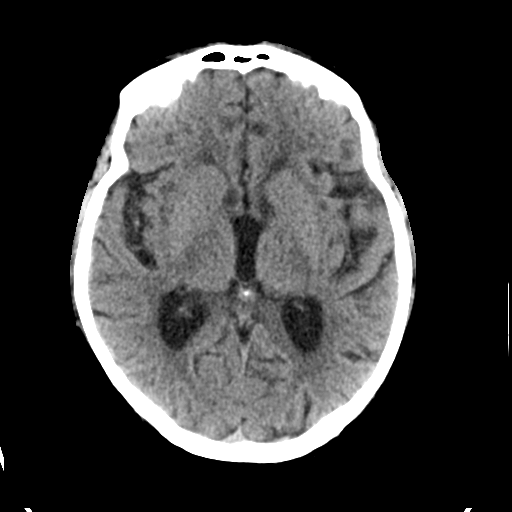
[im 21/33  brain]
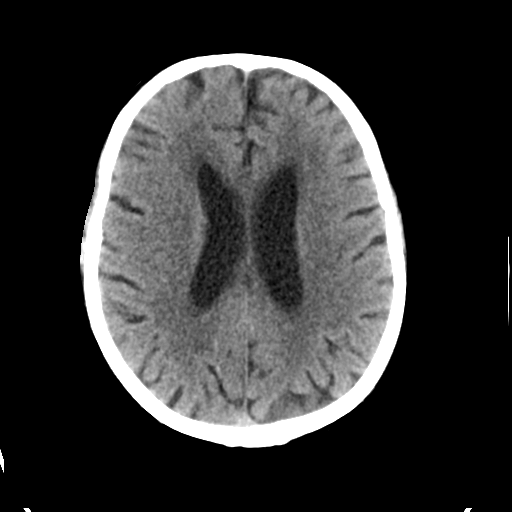
[im 21/33  bone]
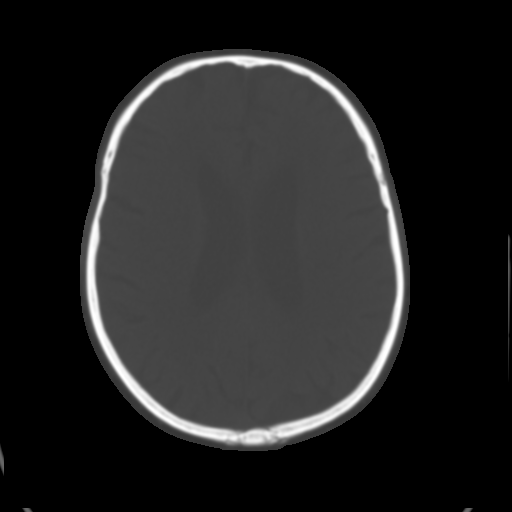
[im 25/33  brain]
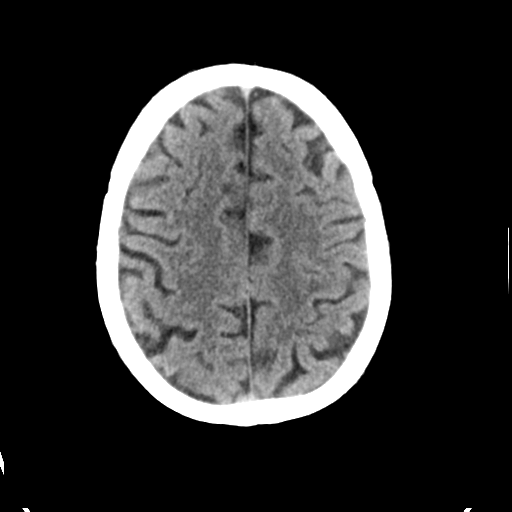
[im 29/33  brain]
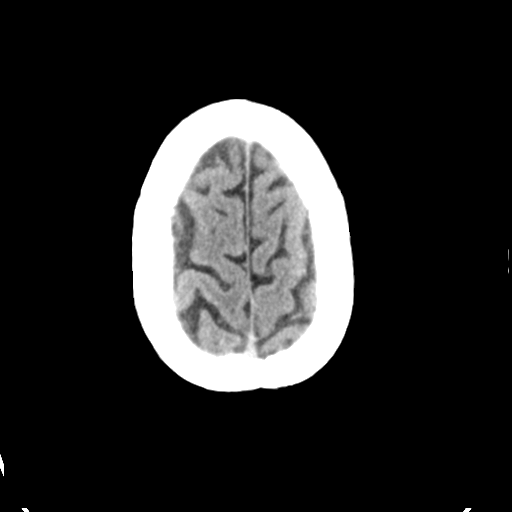

[Series 4: head bone · axial · 0.44mm/px · z∈[-192,-160]mm · 3 of 83 slices shown]
[im 9/83  bone]
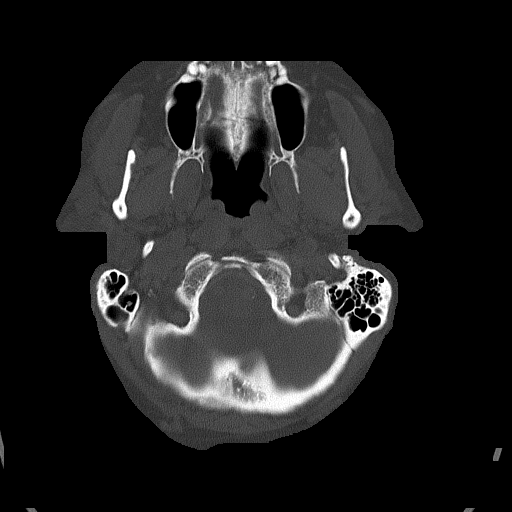
[im 17/83  bone]
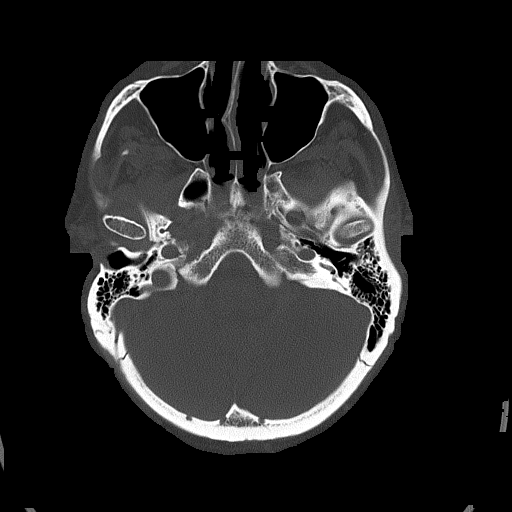
[im 25/83  bone]
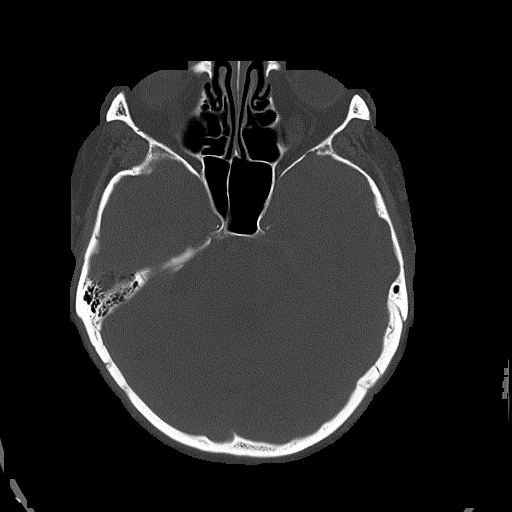

[Series 5: head without cor · coronal · non-contrast · 0.32mm/px · 3 of 70 slices shown]
[im 24/70  brain]
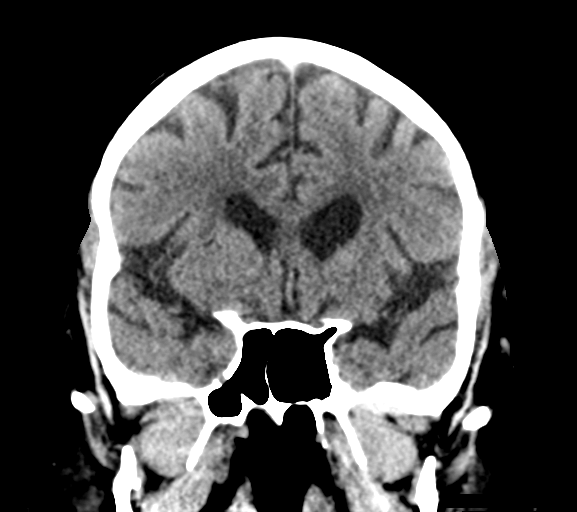
[im 31/70  brain]
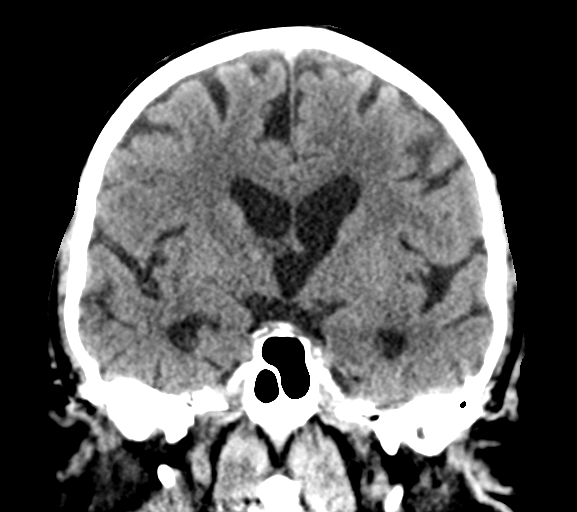
[im 39/70  brain]
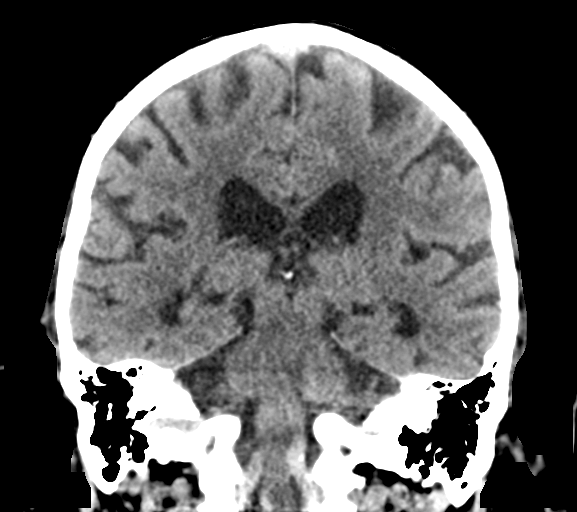

[Series 6: head without sag · sagittal · non-contrast · 0.32mm/px · 3 of 66 slices shown]
[im 22/66  brain]
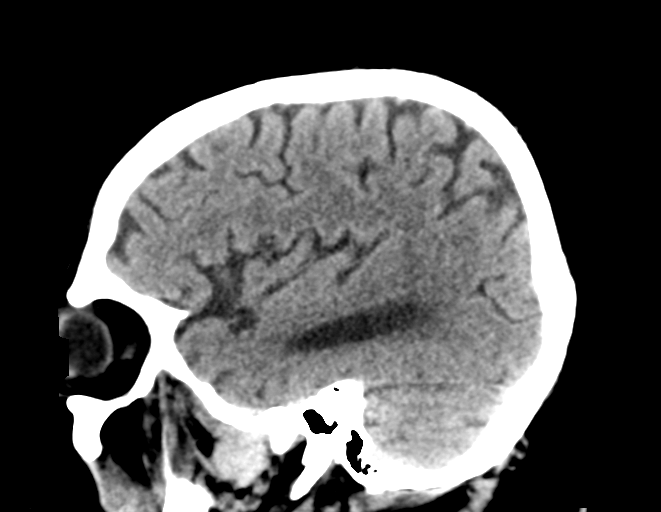
[im 33/66  brain]
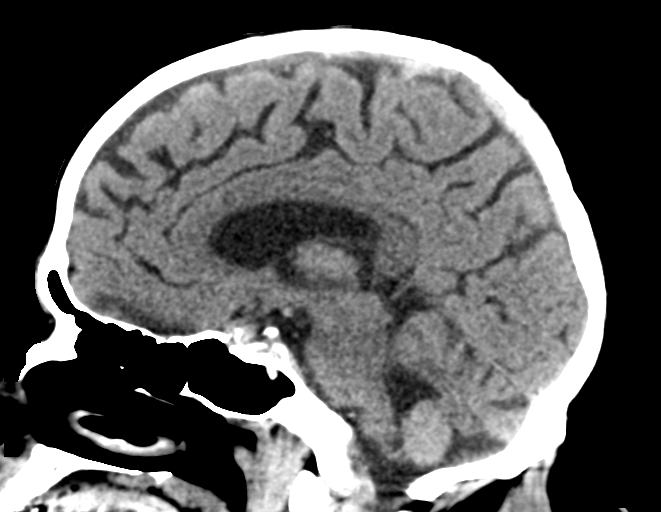
[im 44/66  brain]
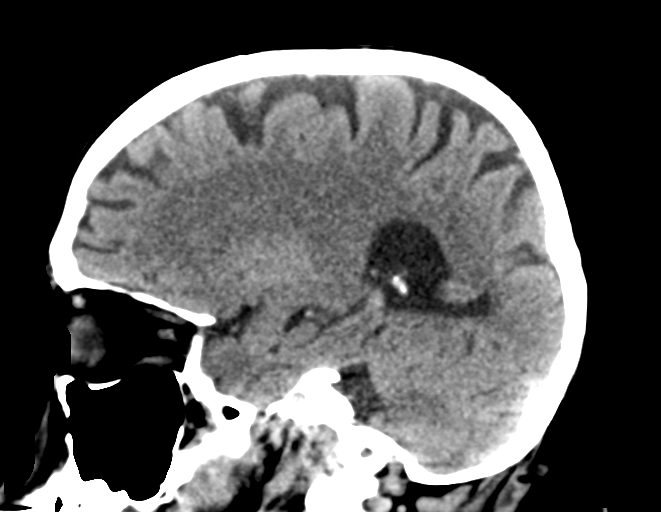

[16 of 47 positions shown; findings below may reference images not displayed]

FINDINGS: Brain: There is mild diffuse atrophy. There is no intracranial mass,
hemorrhage, extra-axial fluid collection, or midline shift. There is
mild small vessel disease in the centra semiovale bilaterally.
Elsewhere brain parenchyma appears unremarkable. There is no
demonstrable acute infarct.

Vascular: There is no hyperdense vessel. There is calcification in
each carotid siphon region.

Skull: Bony calvarium appears intact.

Sinuses/Orbits: There is mucosal thickening in several ethmoid air
cells. Other paranasal sinuses are clear. Orbits appear symmetric
bilaterally.

Other: Mastoid air cells are clear.
IMPRESSION: Mild diffuse atrophy with mild periventricular small vessel disease.
No evident acute infarct. No mass or hemorrhage.

There are foci of arterial vascular calcification. There is mucosal
thickening in several ethmoid air cells.

## 2021-06-04 DIAGNOSIS — B351 Tinea unguium: Secondary | ICD-10-CM | POA: Diagnosis not present

## 2021-06-04 DIAGNOSIS — M79672 Pain in left foot: Secondary | ICD-10-CM | POA: Diagnosis not present

## 2021-06-04 DIAGNOSIS — M79671 Pain in right foot: Secondary | ICD-10-CM | POA: Diagnosis not present

## 2021-06-04 DIAGNOSIS — E119 Type 2 diabetes mellitus without complications: Secondary | ICD-10-CM | POA: Diagnosis not present

## 2021-06-05 NOTE — Progress Notes (Signed)
Thomas [name] - 75 y.o. male MRN [id number]  Date of birth: Jun 06, 1946  Office Visit Note: Visit Date: 05/29/2021 PCP: [name], MD Referred by: [name], MD  Subjective: Chief Complaint  Patient presents with   Right Foot - Pain   Lower Back - Pain   Right Knee - Pain   Right Thigh - Pain   HPI:  Thomas [name] is a 75 y.o. male who comes in today at the request of Dr. Anderson Malta for planned Right L4-5 Lumbar Interlaminar epidural steroid injection with fluoroscopic guidance.  The patient has failed conservative care including home exercise, medications, time and activity modification.  This injection will be diagnostic and hopefully therapeutic.  Please see requesting physician notes for further details and justification.   ROS Otherwise per HPI.  Assessment & Plan: Visit Diagnoses:    ICD-10-CM   1. Lumbar radiculopathy  M54.16 XR C-ARM NO REPORT    Epidural Steroid injection    betamethasone acetate-betamethasone sodium phosphate (CELESTONE) injection 12 mg      Plan: No additional findings.   Meds & Orders:  Meds ordered this encounter  Medications   betamethasone acetate-betamethasone sodium phosphate (CELESTONE) injection 12 mg    Orders Placed This Encounter  Procedures   XR C-ARM NO REPORT   Epidural Steroid injection    Follow-up: No follow-ups on file.   Procedures: No procedures performed  Lumbar Epidural Steroid Injection - Interlaminar Approach with Fluoroscopic Guidance  Patient: Thomas [name]      Date of Birth: 11-14-1945 MRN: [id number] PCP: [name], MD      Visit Date: 05/29/2021   Universal Protocol:     Consent Given By: the patient  Position: PRONE  Additional Comments: Vital signs were monitored before and after the procedure. Patient was prepped and draped in the usual sterile fashion. The correct patient, procedure, and site was verified.   Injection Procedure Details:   Procedure diagnoses: Lumbar  radiculopathy [M54.16]   Meds Administered:  Meds ordered this encounter  Medications   betamethasone acetate-betamethasone sodium phosphate (CELESTONE) injection 12 mg     Laterality: Right  Location/Site:  L4-5  Needle: 3.5 in., 20 ga. Tuohy  Needle Placement: Paramedian epidural  Findings:   -Comments: Excellent flow of contrast into the epidural space.  Procedure Details: Using a paramedian approach from the side mentioned above, the region overlying the inferior lamina was localized under fluoroscopic visualization and the soft tissues overlying this structure were infiltrated with 4 ml. of 1% Lidocaine without Epinephrine. The Tuohy needle was inserted into the epidural space using a paramedian approach.   The epidural space was localized using loss of resistance along with counter oblique bi-planar fluoroscopic views.  After negative aspirate for air, blood, and CSF, a 2 ml. volume of Isovue-250 was injected into the epidural space and the flow of contrast was observed. Radiographs were obtained for documentation purposes.    The injectate was administered into the level noted above.   Additional Comments:  The patient tolerated the procedure well Dressing: 2 x 2 sterile gauze and Band-Aid    Post-procedure details: Patient was observed during the procedure. Post-procedure instructions were reviewed.  Patient left the clinic in stable condition.   Clinical History: [name], MD - 05/14/2021  Formatting of this note might be different from the original.  INDICATION: Radiculopathy, lumbar region. Low back pain radiating down the RIGHT leg with numbness and tingling. Symptoms for 3 to 4 years, worsening  the past year. History of several falls.   STUDY: MRI of the lumbar spine without intravenous contrast performed on 05/13/2021 5:36 PM.   COMPARISON: No comparison available.   TECHNIQUE: Multiplanar, multisequence MR imaging obtained through the lumbar spine  without contrast on 05/13/2021 5:36 PM.   CONTRAST: None.   FINDINGS:  #  Osseous structures: Anterolateral osteophytes noted at multiple levels. There is a Schmorl's nodes in the superior endplates of the D63 and L5 vertebral bodies. Modic type II changes seen in superior plate of the O75 vertebral body. No evidence of an acute fracture.  #  Alignment:There is a sigmoid shape to the thoracolumbar spine. Grade 1 retrolisthesis at L2-L3.  #  Conus medullaris/cauda equina: Spinal cord signal is normal and terminates at L1-L2. The cauda equina is unremarkable.   #  Lower thoracic spine: Disc desiccation with a small posterior central disc protrusion effacing the ventral thecal sac at T11-T12. Neuroforamen are patent. This is viewed on the sagittal images only.   #  T12-L1: Disc desiccation without a significant disc herniation, spinal canal, or neuroforaminal compromise. This is viewed on the sagittal images only.  #  L1-L2: Disc desiccation with moderate loss of disc height and a disc bulge. Superimposed central disc extrusion migrating caudally appreciated. Mild facet hypertrophy. This results in moderate spinal canal and mild lateral recess and mild neuroforaminal stenosis.  #  L2-L3: Disc desiccation with moderate loss of disc height and a disc bulge. Posterior lateral osteophyte on the LEFT. Bilateral facet hypertrophy. This produces moderate spinal canal/lateral recess and mild neuroforaminal stenosis.  #  L3-L4: Disc desiccation with moderate loss of disc height and a disc bulge. Superimposed central disc protrusion noted. Posterior marginal osteophytes appreciated. Bilateral facet hypertrophy with ligamentum flavum redundancy on the LEFT. This is producing moderate to severe spinal canal/RIGHT lateral recess narrowing. Moderate LEFT lateral recess narrowing. Mild bilateral neuroforaminal stenosis.  #  L4-L5: Disc desiccation with mild loss of disc height and a disc bulge. Posterior marginal  osteophytes appreciated. Bilateral facet hypertrophy and ligamentum flavum redundancy. This results in moderate spinal canal/lateral recess narrowing. Moderate to severe RIGHT and mild LEFT neuroforaminal stenosis.  #  L5-S1: Disc desiccation with severe loss of disc height and a disc bulge. Posterior marginal osteophytes appreciated. Bilateral facet hypertrophy that is greater on the RIGHT. Spinal canal is patent. Moderate bilateral neuroforaminal stenosis.   #  Paraspinal tissues: Unremarkable.   #  Additional comments: None.    IMPRESSION:  1.  Multilevel degenerative disc, facet arthrosis and a sigmoid shape curvature to the thoracolumbar spine, as described above. This is most notable for moderate to severe spinal canal/RIGHT lateral recess narrowing and moderate LEFT lateral recess narrowing with mild bilateral neuroforaminal stenosis at L3-L4.  2.  Moderate spinal canal/lateral recess narrowing and moderate to severe RIGHT and mild LEFT neuroforaminal stenosis at L4-L5.  3.  Moderate spinal canal/lateral recess narrowing and mild neuroforaminal stenosis at L2-L3.  4.  Moderate spinal canal and mild lateral recess narrowing and mild bilateral neuroforaminal stenosis at L1-L2.  5.  Moderate bilateral neuroforaminal stenosis at L5-S1.   Electronically Signed by: [name] on 05/14/2021 1:50 PM     Objective:  VS:  HT:    WT:   BMI:     BP:(!) 193/79  HR:(!) 106bpm  TEMP: ( )  RESP:  Physical Exam Vitals and nursing note reviewed.  Constitutional:      General: He is not in acute distress.  Appearance: Normal appearance. He is obese. He is not ill-appearing.  HENT:     Head: Normocephalic and atraumatic.     Right Ear: External ear normal.     Left Ear: External ear normal.     Nose: No congestion.  Eyes:     Extraocular Movements: Extraocular movements intact.  Cardiovascular:     Rate and Rhythm: Normal rate.     Pulses: Normal pulses.  Pulmonary:     Effort:  Pulmonary effort is normal. No respiratory distress.  Abdominal:     General: There is no distension.     Palpations: Abdomen is soft.  Musculoskeletal:        General: No tenderness or signs of injury.     Cervical back: Neck supple.     Right lower leg: No edema.     Left lower leg: No edema.     Comments: Patient has good distal strength without clonus.  Skin:    Findings: No erythema or rash.  Neurological:     General: No focal deficit present.     Mental Status: He is alert and oriented to person, place, and time.     Sensory: No sensory deficit.     Motor: No weakness or abnormal muscle tone.     Coordination: Coordination normal.  Psychiatric:        Mood and Affect: Mood normal.        Behavior: Behavior normal.     Imaging: No results found.

## 2021-06-05 NOTE — Procedures (Signed)
Lumbar Epidural Steroid Injection - Interlaminar Approach with Fluoroscopic Guidance  Patient: Thomas [name]      Date of Birth: [birth year] MRN: [id number] PCP: [name], MD      Visit Date: 05/29/2021   Universal Protocol:     Consent Given By: the patient  Position: PRONE  Additional Comments: Vital signs were monitored before and after the procedure. Patient was prepped and draped in the usual sterile fashion. The correct patient, procedure, and site was verified.   Injection Procedure Details:   Procedure diagnoses: Lumbar radiculopathy [M54.16]   Meds Administered:  Meds ordered this encounter  Medications   betamethasone acetate-betamethasone sodium phosphate (CELESTONE) injection 12 mg     Laterality: Right  Location/Site:  L4-5  Needle: 3.5 in., 20 ga. Tuohy  Needle Placement: Paramedian epidural  Findings:   -Comments: Excellent flow of contrast into the epidural space.  Procedure Details: Using a paramedian approach from the side mentioned above, the region overlying the inferior lamina was localized under fluoroscopic visualization and the soft tissues overlying this structure were infiltrated with 4 ml. of 1% Lidocaine without Epinephrine. The Tuohy needle was inserted into the epidural space using a paramedian approach.   The epidural space was localized using loss of resistance along with counter oblique bi-planar fluoroscopic views.  After negative aspirate for air, blood, and CSF, a 2 ml. volume of Isovue-250 was injected into the epidural space and the flow of contrast was observed. Radiographs were obtained for documentation purposes.    The injectate was administered into the level noted above.   Additional Comments:  The patient tolerated the procedure well Dressing: 2 x 2 sterile gauze and Band-Aid    Post-procedure details: Patient was observed during the procedure. Post-procedure instructions were reviewed.  Patient left the clinic  in stable condition.

## 2021-06-09 ENCOUNTER — Telehealth: Payer: Self-pay | Admitting: Physical Medicine and Rehabilitation

## 2021-06-09 NOTE — Telephone Encounter (Signed)
05/29/21 ov note faxed [facility] [phone]

## 2021-06-13 ENCOUNTER — Ambulatory Visit (INDEPENDENT_AMBULATORY_CARE_PROVIDER_SITE_OTHER): Payer: Medicare Other | Admitting: Orthopedic Surgery

## 2021-06-13 ENCOUNTER — Other Ambulatory Visit: Payer: Self-pay

## 2021-06-13 DIAGNOSIS — M5416 Radiculopathy, lumbar region: Secondary | ICD-10-CM | POA: Diagnosis not present

## 2021-06-14 ENCOUNTER — Encounter: Payer: Self-pay | Admitting: Orthopedic Surgery

## 2021-06-14 NOTE — Progress Notes (Signed)
Office Visit Note   Patient: Thomas [name]           Date of Birth: [birth year]           MRN: [id number] Visit Date: 06/13/2021 Requested by: [name], [address] [place],  [place] [id number] PCP: [name], MD  Subjective: Chief Complaint  Patient presents with   Lower Back - Follow-up    HPI: Thomas [name] is a 75 year old patient who had epidural steroid injection in his lumbar spine for predominantly right-sided radiculopathy about a month ago.  He is doing better but would like another injection.  He has had injections affecting both sides of his back and legs.  No history of back surgery.  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              ROS: All systems reviewed are negative as they relate to the chief complaint within the history of present illness.  Patient denies  fevers or chills.   Assessment & Plan: Visit Diagnoses:  1. Radiculopathy, lumbar region     Plan: Impression is radicular leg pain and back pain which is not intractable at this time.  Overall he is doing well.  Based on the number of injections he can receive a year as well as his current level of symptoms I think he would do well to get epidural steroid injection in early November before a family trip.  Follow-up with me as needed.  Referral to Dr. Ernestina Patches made.  Follow-Up Instructions: Return if symptoms worsen or fail to improve.   Orders:  Orders Placed This Encounter  Procedures   Ambulatory referral to Physical Medicine Rehab   No orders of the defined types were placed in this encounter.     Procedures: No procedures performed   Clinical Data: No additional findings.  Objective: Vital Signs: There were no vitals taken for this visit.  Physical Exam:   Constitutional: Patient appears well-developed HEENT:  Head: Normocephalic Eyes:EOM are normal Neck: Normal range of motion Cardiovascular: Normal rate Pulmonary/chest: Effort normal Neurologic: Patient is alert Skin:  Skin is warm Psychiatric: Patient has normal mood and affect   Ortho Exam: Ortho exam demonstrates full active and passive range of motion of the ankles and knees.  No groin pain with internal or external rotation of the leg.  Hip flexion strength is 5+ out of 5 bilaterally.  No definite paresthesias L1 S1 bilaterally.  Some pain with forward lateral bending.  Both feet are perfused.  Specialty Comments:  No specialty comments available.  Imaging: No results found.   PMFS History: Patient Active Problem List   Diagnosis Date Noted   Excessive daytime sleepiness 11/13/2020   Narcotic habituation, continuous ([place]) 11/13/2020   At risk for central sleep apnea 11/13/2020   History of claustrophobia 11/13/2020   Morbid obesity ([place]) 11/13/2020   Nocturia 11/13/2020   PTSD (post-traumatic stress disorder) 11/13/2020   Cognitive and behavioral changes 12/28/2019   Tobacco abuse 12/11/2019   Hypertension    Acute metabolic encephalopathy    Coronary artery disease 02/11/2018   Claudication in peripheral vascular disease ([place]) 02/11/2018   Atypical chest pain 01/12/2018   Precordial chest pain 01/07/2018   Coronary artery calcification 01/07/2018   CKD (chronic kidney disease) stage 3, GFR 30-59 ml/min ([facility]) 11/25/2017   Chronic diastolic heart failure ([place]) 05/13/2017   Aortic stenosis, mild 05/13/2017   Low back pain 05/12/2017   GERD (gastroesophageal reflux disease) 05/12/2017   Prostate  nodule 05/12/2017   Obesity 05/12/2017   Depression 05/12/2017   Hx of colonic polyps 05/12/2017   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/2017   Type 2 diabetes mellitus ([place]) 05/12/2017   Hyperlipidemia 05/12/2017   Hypertensive heart disease with heart failure (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   Seborrheic dermatitis 05/12/2017   Paresthesia 05/12/2017    Past Medical History:  Diagnosis Date   Actinic keratosis 05/12/2017   Back pain    Depression 05/12/2017   Diabetes ([place])    GERD (gastroesophageal reflux disease) 05/12/2017   Hx of colonic polyps 05/12/2017   Hyperlipidemia 05/12/2017   Hypertension    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/2017   Low back pain 05/12/2017   Lumbar radiculopathy 05/12/2017   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   Obesity 05/12/2017   Osteoarthritis of knee 05/12/2017   Paresthesia 05/12/2017   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   Prostate nodule 05/12/2017   Seborrheic dermatitis 05/12/2017    Family History  Problem Relation Age of Onset   Hypertension Mother    Hypertension Father    Alzheimer's disease Father     Past Surgical History:  Procedure Laterality Date   HAND SURGERY     REPLACEMENT TOTAL KNEE Right    RIGHT/LEFT HEART CATH AND CORONARY ANGIOGRAPHY N/A 01/12/2018   Procedure: RIGHT/LEFT HEART CATH AND CORONARY ANGIOGRAPHY;  Surgeon: Belva Crome, MD;  Location: [facility];  Service: Cardiovascular;  Laterality: N/A;   SHOULDER SURGERY Right    Social History   Occupational History   Occupation: retired Nature conservation officer  Tobacco Use   Smoking status: Every Day    Packs/day: 1.00    Years: 55.00    Pack years: 55.00    Types: Cigarettes   Smokeless tobacco: Never   Tobacco comments:    Has cut back  Vaping Use   Vaping Use: Never used  Substance and Sexual Activity   Alcohol use: No   Drug use: No    Comment: takes prescriptions as prescribed: oxycodone and methadone   Sexual activity: Never    Birth control/protection: None

## 2021-06-18 ENCOUNTER — Telehealth: Payer: Self-pay | Admitting: Physical Medicine and Rehabilitation

## 2021-06-18 NOTE — Telephone Encounter (Signed)
Pt called stating they need a CB to set an appt.

## 2021-07-08 ENCOUNTER — Other Ambulatory Visit: Payer: Self-pay

## 2021-07-08 ENCOUNTER — Ambulatory Visit: Payer: Self-pay

## 2021-07-08 ENCOUNTER — Encounter: Payer: Self-pay | Admitting: Physical Medicine and Rehabilitation

## 2021-07-08 ENCOUNTER — Ambulatory Visit (INDEPENDENT_AMBULATORY_CARE_PROVIDER_SITE_OTHER): Payer: Medicare Other | Admitting: Physical Medicine and Rehabilitation

## 2021-07-08 VITALS — BP 155/78 | HR 84

## 2021-07-08 DIAGNOSIS — M5416 Radiculopathy, lumbar region: Secondary | ICD-10-CM

## 2021-07-08 DIAGNOSIS — M48062 Spinal stenosis, lumbar region with neurogenic claudication: Secondary | ICD-10-CM

## 2021-07-08 DIAGNOSIS — M4185 Other forms of scoliosis, thoracolumbar region: Secondary | ICD-10-CM

## 2021-07-08 MED ORDER — BETAMETHASONE SOD PHOS & ACET 6 (3-3) MG/ML IJ SUSP
12.0000 mg | Freq: Once | INTRAMUSCULAR | Status: AC
  Administered 2021-07-08: 12 mg

## 2021-07-08 NOTE — Patient Instructions (Signed)
[facility] Discharge Instructions  *At any time if you have questions or concerns they can be answered by calling [phone]  All Patients: You may experience an increase in your symptoms for the first 2 days (it can take 2 days to 2 weeks for the steroid/cortisone to have its maximal effect). You may use ice to the site for the first 24 hours; 20 minutes on and 20 minutes off and may use heat after that time. You may resume and continue your current pain medications. If you need a refill please contact the prescribing physician. You may resume your medications if any were stopped for the procedure. You may shower but no swimming, tub bath or Jacuzzi for 24 hours. Please remove bandage after 4 hours. You may resume light activities as tolerated. If you had Spine Injection, you should not drive for the next 3 hours due to anesthetics used in the procedure. Please have someone drive for you.  *If you have had sedation, Valium, Xanax, or lorazepam: Do not drive or use public transportation for 24 hours, do not operating hazardous machinery or make important personal/business decisions for 24 hours.  POSSIBLE STEROID SIDE EFFECTS: If experienced these should only last for a short period. Change in menstrual flow  Edema in (swelling)  Increased appetite Skin flushing (redness)  Skin rash/acne  Thrush (oral) Vaginitis    Increased sweating  Depression Increased blood glucose levels Cramping and leg/calf  Euphoria (feeling happy)  POSSIBLE PROCEDURE SIDE EFFECTS: Please call our office if concerned. Increased pain Increased numbness/tingling  Headache Nausea/vomiting Hematoma (bruising/bleeding) Edema (swelling at the site) Weakness  Infection (red/drainage at site) Fever greater than 100.55F  *In the event of a headache after epidural steroid injection: Drink plenty of fluids, especially water and try to lay flat when possible. If the headache does not get better after a  few days or as always if concerned please call the office.

## 2021-07-08 NOTE — Progress Notes (Signed)
Pt state lower back pain that travels to his right leg. Pt mention his right knee was swollen. Pt state walking, standing and laying down makes the pain worse. Pt state he takes pain meds to help ease his pain. Pt has hx of inj on 05/29/21 pt state it helped.  Numeric Pain Rating Scale and Functional Assessment Average Pain 8   In the last MONTH (on 0-10 scale) has pain interfered with the following?  1. General activity like being  able to carry out your everyday physical activities such as walking, climbing stairs, carrying groceries, or moving a chair?  Rating(10)   +Driver, -BT, -Dye Allergies.

## 2021-07-10 ENCOUNTER — Telehealth: Payer: Self-pay | Admitting: Physical Medicine and Rehabilitation

## 2021-07-10 NOTE — Telephone Encounter (Signed)
Pt states that the injection on 07/08/2021 did not work.He got right L4-5 interlam in his knee. Is there something else he can have done?   CB [phone]

## 2021-07-10 NOTE — Telephone Encounter (Signed)
Injection 2 days ago. Please advise.

## 2021-07-11 NOTE — Telephone Encounter (Signed)
Called patient to advise.

## 2021-07-12 NOTE — Procedures (Signed)
Lumbar Epidural Steroid Injection - Interlaminar Approach with Fluoroscopic Guidance  Patient: Thomas [name]      Date of Birth: 08-28-1946 MRN: [id number] PCP: [name], MD      Visit Date: 07/08/2021   Universal Protocol:     Consent Given By: the patient  Position: PRONE  Additional Comments: Vital signs were monitored before and after the procedure. Patient was prepped and draped in the usual sterile fashion. The correct patient, procedure, and site was verified.   Injection Procedure Details:   Procedure diagnoses: Lumbar radiculopathy [M54.16]   Meds Administered:  Meds ordered this encounter  Medications   betamethasone acetate-betamethasone sodium phosphate (CELESTONE) injection 12 mg     Laterality: Right  Location/Site:  L5-S1 initial approach was the L4-5 level but do to body habitus and bony narrowing we could not obtain loss-of-resistance at L4-5 so we did move down the level to L5-S1.  Needle: 3.5 in., 20 ga. Tuohy  Needle Placement: Paramedian epidural  Findings:   -Comments: Excellent flow of contrast into the epidural space.  Procedure Details: Using a paramedian approach from the side mentioned above, the region overlying the inferior lamina was localized under fluoroscopic visualization and the soft tissues overlying this structure were infiltrated with 4 ml. of 1% Lidocaine without Epinephrine. The Tuohy needle was inserted into the epidural space using a paramedian approach.   The epidural space was localized using loss of resistance along with counter oblique bi-planar fluoroscopic views.  After negative aspirate for air, blood, and CSF, a 2 ml. volume of Isovue-250 was injected into the epidural space and the flow of contrast was observed. Radiographs were obtained for documentation purposes.    The injectate was administered into the level noted above.   Additional Comments:  The patient tolerated the procedure well Dressing: 2 x 2  sterile gauze and Band-Aid    Post-procedure details: Patient was observed during the procedure. Post-procedure instructions were reviewed.  Patient left the clinic in stable condition.

## 2021-07-12 NOTE — Progress Notes (Signed)
Thomas [name] - 75 y.o. male MRN [id number]  Date of birth: October 05, 1945  Office Visit Note: Visit Date: 07/08/2021 PCP: Thomas Pelton, MD Referred by: Thomas Pelton, MD  Subjective: Chief Complaint  Patient presents with   Lower Back - Pain   Right Leg - Pain   HPI:  Thomas [name] is a 75 y.o. male who comes in today for planned repeat Right L5-S1  Lumbar Interlaminar epidural steroid injection with fluoroscopic guidance.  The patient has failed conservative care including home exercise, medications, time and activity modification.  This injection will be diagnostic and hopefully therapeutic.  Please see requesting physician notes for further details and justification. Patient received more than 50% pain relief from prior injection.  Patient states that he got more relief on the left side than the right even though it was a right interlaminar injection.  He has lateral recess narrowing at multiple levels on the right do to scoliosis and arthritic changes and degenerative changes throughout the whole lumbar spine.  He lacks a lot of insight into his condition and does talk about a lot of pain relief he obtained with a Dr. Lyda [name] for many years but it is hard to know when those were done.  They were blind epidurals done probably many years ago but hard to tell from him.  Nonetheless we will repeat the injection today to see what happens.  I would look at L3 and L4 transforaminal epidural steroid injections on the right if this does not help.  He is likely not a great surgical candidate but could entertain the idea of spinal cord stimulator trial that would be difficult.  He is already in pain management through the [place].  Referring: Dr. Landry Dyke [name]   ROS Otherwise per HPI.  Assessment & Plan: Visit Diagnoses:    ICD-10-CM   1. Lumbar radiculopathy  M54.16 XR C-ARM NO REPORT    Epidural Steroid injection    betamethasone acetate-betamethasone sodium phosphate (CELESTONE) injection 12 mg       Plan: No additional findings.   Meds & Orders:  Meds ordered this encounter  Medications   betamethasone acetate-betamethasone sodium phosphate (CELESTONE) injection 12 mg    Orders Placed This Encounter  Procedures   XR C-ARM NO REPORT   Epidural Steroid injection    Follow-up: No follow-ups on file.   Procedures: No procedures performed      Clinical History: Thomas Lions, MD - 05/14/2021  Formatting of this note might be different from the original.  INDICATION: Radiculopathy, lumbar region. Low back pain radiating down the RIGHT leg with numbness and tingling. Symptoms for 3 to 4 years, worsening the past year. History of several falls.   STUDY: MRI of the lumbar spine without intravenous contrast performed on 05/13/2021 5:36 PM.   COMPARISON: No comparison available.   TECHNIQUE: Multiplanar, multisequence MR imaging obtained through the lumbar spine without contrast on 05/13/2021 5:36 PM.   CONTRAST: None.   FINDINGS:  #  Osseous structures: Anterolateral osteophytes noted at multiple levels. There is a Schmorl's nodes in the superior endplates of the R43 and L5 vertebral bodies. Modic type II changes seen in superior plate of the Q00 vertebral body. No evidence of an acute fracture.  #  Alignment:There is a sigmoid shape to the thoracolumbar spine. Grade 1 retrolisthesis at L2-L3.  #  Conus medullaris/cauda equina: Spinal cord signal is normal and terminates at L1-L2. The cauda equina is unremarkable.   #  Lower thoracic  spine: Disc desiccation with a small posterior central disc protrusion effacing the ventral thecal sac at T11-T12. Neuroforamen are patent. This is viewed on the sagittal images only.   #  T12-L1: Disc desiccation without a significant disc herniation, spinal canal, or neuroforaminal compromise. This is viewed on the sagittal images only.  #  L1-L2: Disc desiccation with moderate loss of disc height and a disc bulge. Superimposed central disc  extrusion migrating caudally appreciated. Mild facet hypertrophy. This results in moderate spinal canal and mild lateral recess and mild neuroforaminal stenosis.  #  L2-L3: Disc desiccation with moderate loss of disc height and a disc bulge. Posterior lateral osteophyte on the LEFT. Bilateral facet hypertrophy. This produces moderate spinal canal/lateral recess and mild neuroforaminal stenosis.  #  L3-L4: Disc desiccation with moderate loss of disc height and a disc bulge. Superimposed central disc protrusion noted. Posterior marginal osteophytes appreciated. Bilateral facet hypertrophy with ligamentum flavum redundancy on the LEFT. This is producing moderate to severe spinal canal/RIGHT lateral recess narrowing. Moderate LEFT lateral recess narrowing. Mild bilateral neuroforaminal stenosis.  #  L4-L5: Disc desiccation with mild loss of disc height and a disc bulge. Posterior marginal osteophytes appreciated. Bilateral facet hypertrophy and ligamentum flavum redundancy. This results in moderate spinal canal/lateral recess narrowing. Moderate to severe RIGHT and mild LEFT neuroforaminal stenosis.  #  L5-S1: Disc desiccation with severe loss of disc height and a disc bulge. Posterior marginal osteophytes appreciated. Bilateral facet hypertrophy that is greater on the RIGHT. Spinal canal is patent. Moderate bilateral neuroforaminal stenosis.   #  Paraspinal tissues: Unremarkable.   #  Additional comments: None.    IMPRESSION:  1.  Multilevel degenerative disc, facet arthrosis and a sigmoid shape curvature to the thoracolumbar spine, as described above. This is most notable for moderate to severe spinal canal/RIGHT lateral recess narrowing and moderate LEFT lateral recess narrowing with mild bilateral neuroforaminal stenosis at L3-L4.  2.  Moderate spinal canal/lateral recess narrowing and moderate to severe RIGHT and mild LEFT neuroforaminal stenosis at L4-L5.  3.  Moderate spinal canal/lateral recess  narrowing and mild neuroforaminal stenosis at L2-L3.  4.  Moderate spinal canal and mild lateral recess narrowing and mild bilateral neuroforaminal stenosis at L1-L2.  5.  Moderate bilateral neuroforaminal stenosis at L5-S1.   Electronically Signed by: Thomas [name] on 05/14/2021 1:50 PM     Objective:  VS:  HT:    WT:   BMI:     BP:(!) 155/78  HR:84bpm  TEMP: ( )  RESP:  Physical Exam Vitals and nursing note reviewed.  Constitutional:      General: He is not in acute distress.    Appearance: Normal appearance. He is obese. He is not ill-appearing.  HENT:     Head: Normocephalic and atraumatic.     Right Ear: External ear normal.     Left Ear: External ear normal.     Nose: No congestion.  Eyes:     Extraocular Movements: Extraocular movements intact.  Cardiovascular:     Rate and Rhythm: Normal rate.     Pulses: Normal pulses.  Pulmonary:     Effort: Pulmonary effort is normal. No respiratory distress.  Abdominal:     General: There is no distension.     Palpations: Abdomen is soft.  Musculoskeletal:        General: No tenderness or signs of injury.     Cervical back: Neck supple.     Right lower leg: No edema.  Left lower leg: No edema.     Comments: Patient has good distal strength without clonus.  Skin:    Findings: No erythema or rash.  Neurological:     General: No focal deficit present.     Mental Status: He is alert and oriented to person, place, and time.     Sensory: No sensory deficit.     Motor: No weakness or abnormal muscle tone.     Coordination: Coordination normal.  Psychiatric:        Mood and Affect: Mood normal.        Behavior: Behavior normal.     Imaging: No results found.

## 2021-08-14 ENCOUNTER — Emergency Department (HOSPITAL_BASED_OUTPATIENT_CLINIC_OR_DEPARTMENT_OTHER)
Admission: EM | Admit: 2021-08-14 | Discharge: 2021-08-14 | Disposition: A | Discharge status: Home / Self Care | Payer: No Typology Code available for payment source | Attending: Emergency Medicine | Admitting: Emergency Medicine

## 2021-08-14 ENCOUNTER — Encounter (HOSPITAL_BASED_OUTPATIENT_CLINIC_OR_DEPARTMENT_OTHER): Payer: Self-pay

## 2021-08-14 ENCOUNTER — Emergency Department (HOSPITAL_BASED_OUTPATIENT_CLINIC_OR_DEPARTMENT_OTHER): Payer: No Typology Code available for payment source

## 2021-08-14 ENCOUNTER — Other Ambulatory Visit: Payer: Self-pay

## 2021-08-14 DIAGNOSIS — F17210 Nicotine dependence, cigarettes, uncomplicated: Secondary | ICD-10-CM | POA: Diagnosis not present

## 2021-08-14 DIAGNOSIS — H5712 Ocular pain, left eye: Secondary | ICD-10-CM | POA: Diagnosis present

## 2021-08-14 DIAGNOSIS — Z79899 Other long term (current) drug therapy: Secondary | ICD-10-CM | POA: Diagnosis not present

## 2021-08-14 DIAGNOSIS — E119 Type 2 diabetes mellitus without complications: Secondary | ICD-10-CM | POA: Insufficient documentation

## 2021-08-14 DIAGNOSIS — I509 Heart failure, unspecified: Secondary | ICD-10-CM | POA: Insufficient documentation

## 2021-08-14 DIAGNOSIS — Z96651 Presence of right artificial knee joint: Secondary | ICD-10-CM | POA: Diagnosis not present

## 2021-08-14 DIAGNOSIS — I130 Hypertensive heart and chronic kidney disease with heart failure and stage 1 through stage 4 chronic kidney disease, or unspecified chronic kidney disease: Secondary | ICD-10-CM | POA: Insufficient documentation

## 2021-08-14 DIAGNOSIS — L03213 Periorbital cellulitis: Secondary | ICD-10-CM | POA: Insufficient documentation

## 2021-08-14 DIAGNOSIS — I2510 Atherosclerotic heart disease of native coronary artery without angina pectoris: Secondary | ICD-10-CM | POA: Insufficient documentation

## 2021-08-14 DIAGNOSIS — N183 Chronic kidney disease, stage 3 unspecified: Secondary | ICD-10-CM | POA: Diagnosis not present

## 2021-08-14 LAB — CBG MONITORING, ED: Glucose-Capillary: 83 mg/dL (ref 70–99)

## 2021-08-14 MED ORDER — AMOXICILLIN-POT CLAVULANATE 875-125 MG PO TABS
1.0000 | ORAL_TABLET | Freq: Once | ORAL | Status: AC
  Administered 2021-08-14: 1 via ORAL
  Filled 2021-08-14: qty 1

## 2021-08-14 MED ORDER — TETRACAINE HCL 0.5 % OP SOLN
2.0000 [drp] | Freq: Once | OPHTHALMIC | Status: AC
  Administered 2021-08-14: 2 [drp] via OPHTHALMIC
  Filled 2021-08-14: qty 4

## 2021-08-14 MED ORDER — AMOXICILLIN-POT CLAVULANATE 875-125 MG PO TABS: 1.0000 | ORAL_TABLET | Freq: Two times a day (BID) | ORAL | 0 refills | Status: DC

## 2021-08-14 MED ORDER — FLUORESCEIN SODIUM 1 MG OP STRP
1.0000 | ORAL_STRIP | Freq: Once | OPHTHALMIC | Status: AC
  Administered 2021-08-14: 1 via OPHTHALMIC
  Filled 2021-08-14: qty 1

## 2021-08-14 MED ORDER — OFLOXACIN 0.3 % OP SOLN
1.0000 [drp] | Freq: Four times a day (QID) | OPHTHALMIC | 0 refills | Status: DC
Start: 2021-08-14 — End: 2021-09-26

## 2021-08-14 NOTE — ED Provider Notes (Signed)
[facility] EMERGENCY DEPARTMENT Provider Note   CSN: [id number] Arrival date & time: 08/14/21  1710     History Chief Complaint  Patient presents with   Eye Pain    Thomas [name] is a 75 y.o. male.  Patient is a 75 year old male who presents with pain in his left eye.  He says it started yesterday with redness and discharge and then got worse today.  He says it is mostly watery discharge but was crusted this morning.  About 2 weeks ago he had some cough and cold symptoms.  His wife says he has ongoing cough.  He has not had any known fevers.  He says his vision is normal.  He has pain around his eye and under his eye.  He has had a prior history of cataracts but denies any other eye problems.  Does not wear contacts.      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   Prostate nodule 05/12/2017   Seborrheic dermatitis 05/12/2017    Patient Active Problem List   Diagnosis Date Noted   Excessive daytime sleepiness 11/13/2020   Narcotic habituation, continuous ([place]) 11/13/2020   At risk for central sleep apnea 11/13/2020   History of claustrophobia 11/13/2020   Morbid obesity ([place]) 11/13/2020   Nocturia 11/13/2020   PTSD (post-traumatic stress disorder) 11/13/2020   Cognitive and behavioral changes 12/28/2019   Tobacco abuse 12/11/2019   Hypertension    Acute metabolic encephalopathy    Coronary artery disease 02/11/2018   Claudication in peripheral vascular disease ([place]) 02/11/2018   Atypical chest pain 01/12/2018   Precordial  chest pain 01/07/2018   Coronary artery calcification 01/07/2018   CKD (chronic kidney disease) stage 3, GFR 30-59 ml/min ([facility]) 11/25/2017   Chronic diastolic heart failure ([place]) 05/13/2017   Aortic stenosis, mild 05/13/2017   Low back pain 05/12/2017   GERD (gastroesophageal reflux disease) 05/12/2017   Prostate nodule 05/12/2017   Obesity 05/12/2017   Depression 05/12/2017   Hx of colonic polyps 05/12/2017   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/2017   Type 2 diabetes mellitus ([place]) 05/12/2017   Hyperlipidemia 05/12/2017   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/2017   Osteoarthritis of knee 05/12/2017   Sleep apnea 05/12/2017   Actinic keratosis 05/12/2017   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   Seborrheic dermatitis 05/12/2017   Paresthesia 05/12/2017    Past Surgical History:  Procedure Laterality Date   HAND SURGERY     REPLACEMENT TOTAL KNEE Right    RIGHT/LEFT HEART CATH AND CORONARY ANGIOGRAPHY N/A 01/12/2018   Procedure: RIGHT/LEFT HEART CATH AND CORONARY ANGIOGRAPHY;  Surgeon: Belva Crome, MD;  Location: [facility];  Service: Cardiovascular;  Laterality: N/A;   SHOULDER SURGERY Right        Family History  Problem Relation Age of Onset   Hypertension Mother    Hypertension Father    Alzheimer's disease Father     Social History   Tobacco Use   Smoking status:  Every Day    Packs/day: 1.00    Years: 55.00    Pack years: 55.00    Types: Cigarettes   Smokeless tobacco: Never   Tobacco comments:    Has cut back  Vaping Use   Vaping Use: Never used  Substance Use Topics   Alcohol use: No   Drug use: No    Comment: takes prescriptions as prescribed: oxycodone and methadone    Home Medications Prior to Admission medications   Medication Sig Start Date End Date Taking? Authorizing Provider  amoxicillin-clavulanate (AUGMENTIN) 875-125 MG tablet Take 1 tablet by mouth every 12 (twelve) hours. 08/14/21  Yes  Malvin Johns, MD  ofloxacin (OCUFLOX) 0.3 % ophthalmic solution Place 1 drop into the left eye 4 (four) times daily. 08/14/21  Yes Malvin Johns, MD  amLODipine (NORVASC) 10 MG tablet TAKE ONE TABLET BY MOUTH EVERY DAY FOR BLOOD PRESSURE * NOTE: NEW DOSE * 06/24/20   [provider]  aspirin 81 MG chewable tablet Chew 81 mg by mouth daily at 12 noon.     [provider]  atorvastatin (LIPITOR) 80 MG tablet Take 40 mg by mouth at bedtime.     [provider]  Calcium Carbonate-Vitamin D 600-400 MG-UNIT tablet Take 1 tablet by mouth 2 (two) times daily. 04/17/20   [provider]  cetirizine (ZYRTEC) 10 MG tablet Take 10 mg by mouth daily as needed for allergies.    [provider]  diclofenac Sodium (VOLTAREN) 1 % GEL Apply 1 application topically 4 (four) times daily as needed (pain).    [provider]  DULoxetine (CYMBALTA) 60 MG capsule Take 60 mg by mouth every evening.     [provider]  EPINEPHrine 0.3 mg/0.3 mL IJ SOAJ injection Inject 0.3 mg into the muscle daily as needed (for anaphylaxis).     [provider]  finasteride (PROSCAR) 5 MG tablet Take 5 mg by mouth daily.    [provider]  glipiZIDE (GLUCOTROL) 5 MG tablet Take 5-10 mg by mouth See admin instructions. Take 2 tablets (10 mg) by mouth daily with breakfast and 1 tablet (5 mg) daily with supper    [provider]  losartan (COZAAR) 100 MG tablet Take 100 mg by mouth daily. For blood pressure and kidney protection    [provider]  meloxicam (MOBIC) 15 MG tablet Take 7.5 mg by mouth 2 (two) times daily as needed for pain.     [provider]  metFORMIN (GLUCOPHAGE) 1000 MG tablet Take 1,000 mg by mouth 2 (two) times daily with a meal.     [provider]  methadone (DOLOPHINE) 5 MG tablet Take 5 mg by mouth every other day. For severe chronic pain    [provider]  omeprazole (PRILOSEC) 40 MG  capsule Take 40 mg by mouth daily.    [provider]  oxyCODONE (OXY IR/ROXICODONE) 5 MG immediate release tablet Take 5 mg by mouth 4 (four) times daily as needed (severe chronic pain).     [provider]  tamsulosin (FLOMAX) 0.4 MG CAPS capsule TAKE ONE CAPSULE BY MOUTH EVERY NIGHT FOR URINATION/PROSTATE * REPLACES TERAZOSIN * 06/24/20   [provider]  tiZANidine (ZANAFLEX) 4 MG tablet TAKE ONE TABLET BY MOUTH EVERY EVENING AS NEEDED FOR MUSCLE SPASMS 01/30/20   [provider]    Allergies    Wasp venom  Review of Systems   Review of Systems  Constitutional:  Negative for chills, diaphoresis,  fatigue and fever.  HENT:  Negative for congestion, rhinorrhea and sneezing.   Eyes:  Positive for pain, discharge and redness. Negative for visual disturbance.  Respiratory:  Positive for cough. Negative for chest tightness and shortness of breath.   Cardiovascular:  Negative for chest pain and leg swelling.  Gastrointestinal:  Negative for abdominal pain, blood in stool, diarrhea, nausea and vomiting.  Genitourinary:  Negative for difficulty urinating, flank pain, frequency and hematuria.  Musculoskeletal:  Negative for arthralgias and back pain.  Skin:  Negative for rash.  Neurological:  Negative for dizziness, speech difficulty, weakness, numbness and headaches.   Physical Exam Updated Vital Signs BP (!) 159/97 (BP Location: Right Arm)   Pulse 81   Temp 98.9 F (37.2 C) (Oral)   Resp 18   Ht _0  (1.803 m)   Wt 106.6 kg   SpO2 95%   BMI 32.78 kg/m   Physical Exam Constitutional:      Appearance: He is well-developed.  HENT:     Head: Normocephalic and atraumatic.  Eyes:     General:        Left eye: Discharge (Watery discharge with crusting on the edges) present.No foreign body.     Intraocular pressure: Left eye pressure is 17 mmHg. Measurements were taken using a handheld tonometer.    Extraocular Movements: Extraocular movements  intact.     Conjunctiva/sclera:     Left eye: Left conjunctiva is injected.     Pupils: Pupils are equal, round, and reactive to light.     Left eye: No corneal abrasion or fluorescein uptake.     Comments: Minor swelling of the upper eyelid and under the left eye  Cardiovascular:     Rate and Rhythm: Normal rate and regular rhythm.     Heart sounds: Normal heart sounds.  Pulmonary:     Effort: Pulmonary effort is normal. No respiratory distress.     Breath sounds: Normal breath sounds. No wheezing or rales.  Chest:     Chest wall: No tenderness.  Abdominal:     General: Bowel sounds are normal.     Palpations: Abdomen is soft.     Tenderness: There is no abdominal tenderness. There is no guarding or rebound.  Musculoskeletal:        General: Normal range of motion.     Cervical back: Normal range of motion and neck supple.  Lymphadenopathy:     Cervical: No cervical adenopathy.  Skin:    General: Skin is warm and dry.     Findings: No rash.  Neurological:     Mental Status: He is alert and oriented to person, place, and time.    ED Results / Procedures / Treatments   Labs (all labs ordered are listed, but only abnormal results are displayed) Labs Reviewed  CBG MONITORING, ED    EKG None  Radiology DG Chest 2 View  Result Date: 08/14/2021 CLINICAL DATA:  Cough. EXAM: CHEST - 2 VIEW COMPARISON:  Chest x-ray 02/21/2018. FINDINGS: The heart is mildly enlarged, unchanged. There are minimal strandy opacities in the left lung base favored is atelectasis. No pleural effusion or pneumothorax. The visualized skeletal structures are unremarkable. IMPRESSION: Stable cardiomegaly.  No active cardiopulmonary disease. Electronically Signed   By: Ronney Asters M.D.   On: 08/14/2021 19:49   CT Orbits Wo Contrast  Result Date: 08/14/2021 CLINICAL DATA:  Left periorbital erythema and pain with headache. EXAM: CT ORBITS WITHOUT CONTRAST TECHNIQUE: Multidetector CT imaging of the  orbits  was performed using the standard protocol without intravenous contrast. Multiplanar CT image reconstructions were also generated. COMPARISON:  MRI brain from 12/14/2019 FINDINGS: Orbits: No abnormal postseptal/intraorbital process is identified. The globes appear intact. Absent right lens. Visible paranasal sinuses: Chronic right frontal, right ethmoid, right greater than left maxillary, and left greater than right sphenoid sinusitis. Soft tissues: Minimal left lateral periorbital soft tissue swelling, cannot exclude periorbital cellulitis but no orbital involvement is observed. Osseous: No fracture or acute bony findings. Limited intracranial: There is atherosclerotic calcification of the cavernous carotid arteries bilaterally. IMPRESSION: 1. Subtle left periorbital soft tissue swelling/edema could reflect periorbital cellulitis, but there is no orbital or postseptal involvement. 2. Chronic paranasal sinusitis. 3. Atherosclerosis. Electronically Signed   By: Van Clines M.D.   On: 08/14/2021 19:55    Procedures Procedures   Medications Ordered in ED Medications  fluorescein ophthalmic strip 1 strip (has no administration in time range)  tetracaine (PONTOCAINE) 0.5 % ophthalmic solution 2 drop (has no administration in time range)  amoxicillin-clavulanate (AUGMENTIN) 875-125 MG per tablet 1 tablet (has no administration in time range)    ED Course  I have reviewed the triage vital signs and the nursing notes.  Pertinent labs & imaging results that were available during my care of the patient were reviewed by me and considered in my medical decision making (see chart for details).    MDM Rules/Calculators/A&P                           Patient presents with pain and redness to his left eye and some swelling around his eye.  CT scan shows no evidence of orbital cellulitis.  There is some concern for preseptal cellulitis.  His vision is normal at 20/20.  He has normal eye pressures without  concern for glaucoma.  No dendrites, rashes or other concerns for shingles.  We will start antibiotics.  We will have him follow-up closely with ophthalmology.  They previously seen an ophthalmologist, Dr. Eulas Post.  They want to go back to see this ophthalmologist.  Advised him to call the office in the morning.  Return precautions were given. Final Clinical Impression(s) / ED Diagnoses Final diagnoses:  Preseptal cellulitis    Rx / DC Orders ED Discharge Orders          Ordered    amoxicillin-clavulanate (AUGMENTIN) 875-125 MG tablet  Every 12 hours        08/14/21 2113    ofloxacin (OCUFLOX) 0.3 % ophthalmic solution  4 times daily        08/14/21 2113             Malvin Johns, MD 08/14/21 2116

## 2021-08-14 NOTE — ED Notes (Signed)
Pt does not wear glasses or contacts. Pt can see part of the 20/20 line and most of the 20/25 line with both lines.

## 2021-08-14 NOTE — ED Triage Notes (Signed)
Pt c/o left eye pain/redness & headache. Seen at urgent care, sent here to r/o orbital cellulitis. Denies vision changes.

## 2021-08-15 DIAGNOSIS — Z961 Presence of intraocular lens: Secondary | ICD-10-CM | POA: Diagnosis not present

## 2021-08-15 DIAGNOSIS — H10012 Acute follicular conjunctivitis, left eye: Secondary | ICD-10-CM | POA: Diagnosis not present

## 2021-09-03 DIAGNOSIS — M79671 Pain in right foot: Secondary | ICD-10-CM | POA: Diagnosis not present

## 2021-09-03 DIAGNOSIS — B351 Tinea unguium: Secondary | ICD-10-CM | POA: Diagnosis not present

## 2021-09-03 DIAGNOSIS — M79672 Pain in left foot: Secondary | ICD-10-CM | POA: Diagnosis not present

## 2021-09-03 DIAGNOSIS — E119 Type 2 diabetes mellitus without complications: Secondary | ICD-10-CM | POA: Diagnosis not present

## 2021-09-15 ENCOUNTER — Telehealth: Payer: Self-pay

## 2021-09-15 NOTE — Telephone Encounter (Signed)
Pt called stating that he would like to have another injection in his back on the right side with Dr. Ernestina Patches again

## 2021-09-18 ENCOUNTER — Other Ambulatory Visit: Payer: Self-pay

## 2021-09-18 ENCOUNTER — Ambulatory Visit (INDEPENDENT_AMBULATORY_CARE_PROVIDER_SITE_OTHER): Payer: Medicare Other | Admitting: Physical Medicine and Rehabilitation

## 2021-09-18 ENCOUNTER — Encounter: Payer: Self-pay | Admitting: Physical Medicine and Rehabilitation

## 2021-09-18 VITALS — BP 142/85 | HR 77

## 2021-09-18 DIAGNOSIS — M5416 Radiculopathy, lumbar region: Secondary | ICD-10-CM

## 2021-09-18 DIAGNOSIS — G894 Chronic pain syndrome: Secondary | ICD-10-CM | POA: Diagnosis not present

## 2021-09-18 DIAGNOSIS — R296 Repeated falls: Secondary | ICD-10-CM

## 2021-09-18 DIAGNOSIS — M48062 Spinal stenosis, lumbar region with neurogenic claudication: Secondary | ICD-10-CM | POA: Diagnosis not present

## 2021-09-18 DIAGNOSIS — M4185 Other forms of scoliosis, thoracolumbar region: Secondary | ICD-10-CM

## 2021-09-18 NOTE — Progress Notes (Signed)
[name] - 76 y.o. male MRN [id number]  Date of birth: Apr 12, 1946  Office Visit Note: Visit Date: 09/18/2021 PCP: [name], MD Referred by: [name], MD  Subjective: Chief Complaint  Patient presents with   Lower Back - Pain   Right Knee - Pain, Sore   HPI: Thomas [name] is a 76 y.o. male who comes in today for evaluation of chronic, worsening and severe right sided lower back pain radiating to anterior thigh and knee. Patient reports pain has been ongoing for several months. Patient reports pain is exacerbated by walking and activity, describes as a constant sore sensation, currently rates as 8 out of 10. Patient reports some relief of pain with rest and use of medications. Patient currently taking Methadone and Oxycodone that is prescribed by his primary care provider Dr. [name] at the [facility]. Patients lumbar MRI from 2022 exhibits multi-level facet hypertrophy, multi-level right lateral recess stenosis most severe at L3-L4 where there is moderate to severe spinal canal stenosis and right lateral recess narrowing. Patient had recent right L5-S1 interlaminar epidural steroid injection on 07/08/2021 which he reports did not help to alleviate pain. Patient did mention several times during our visit that he had previous right knee surgery many years ago at [facility] which he reports only made his pain worse. Patient admits to multiple falls without injuries, states he typically looses his balance but is able to land on chair or pull himself back up. Patient states falls have been occurring for years. Patient is currently using cane to assist with ambulation. Patient denies focal weakness, numbness and tingling.   Review of Systems  Musculoskeletal:  Positive for back pain, falls and joint pain.  Neurological:  Negative for tingling, sensory change, focal weakness and weakness.  All other systems reviewed and are negative. Otherwise per HPI.  Assessment & Plan: Visit  Diagnoses:    ICD-10-CM   1. Lumbar radiculopathy  M54.16 Ambulatory referral to Physical Medicine Rehab    2. Spinal stenosis of lumbar region with neurogenic claudication  M48.062 Ambulatory referral to Physical Medicine Rehab    3. Other form of scoliosis of thoracolumbar spine  M41.85     4. Chronic pain syndrome  G89.4     5. Multiple falls  R29.6        Plan: Findings:  Chronic, worsening and severe right sided lower back radiating to anterior thigh and down to knee. Patient continues to have excruciating pain despite good conservative therapies such as rest and use of medications. Patients clinical presentation and exam are consistent with both L3/L4 nerve pattern and neurogenic claudication as a result of spinal canal stenosis. We believe the next step is to perform a diagnostic and hopefully therapeutic right L3 transforaminal epidural steroid injection under fluoroscopic guidance. Patient instructed to continue treatment for chronic pain at the [place]. We did discuss patients chronic right knee pain with him today and feel he would be served seeing an orthopedic specialist. We did help him arrange for consultation with Dr. Alphonzo Severance today. Patient instructed to continue using cane to assist with ambulation and prevent falls. No red flag symptoms noted upon exam today.    Meds & Orders: No orders of the defined types were placed in this encounter.   Orders Placed This Encounter  Procedures   Ambulatory referral to Physical Medicine Rehab    Follow-up: Return for Right L3 transforaminal epidural steroid injection.   Procedures: No procedures performed  Clinical History: [name], MD - 05/14/2021  Formatting of this note might be different from the original.  INDICATION: Radiculopathy, lumbar region. Low back pain radiating down the RIGHT leg with numbness and tingling. Symptoms for 3 to 4 years, worsening the past year. History of several falls.   STUDY: MRI of the  lumbar spine without intravenous contrast performed on 05/13/2021 5:36 PM.   COMPARISON: No comparison available.   TECHNIQUE: Multiplanar, multisequence MR imaging obtained through the lumbar spine without contrast on 05/13/2021 5:36 PM.   CONTRAST: None.   FINDINGS:  #  Osseous structures: Anterolateral osteophytes noted at multiple levels. There is a Schmorl's nodes in the superior endplates of the C78 and L5 vertebral bodies. Modic type II changes seen in superior plate of the L38 vertebral body. No evidence of an acute fracture.  #  Alignment:There is a sigmoid shape to the thoracolumbar spine. Grade 1 retrolisthesis at L2-L3.  #  Conus medullaris/cauda equina: Spinal cord signal is normal and terminates at L1-L2. The cauda equina is unremarkable.   #  Lower thoracic spine: Disc desiccation with a small posterior central disc protrusion effacing the ventral thecal sac at T11-T12. Neuroforamen are patent. This is viewed on the sagittal images only.   #  T12-L1: Disc desiccation without a significant disc herniation, spinal canal, or neuroforaminal compromise. This is viewed on the sagittal images only.  #  L1-L2: Disc desiccation with moderate loss of disc height and a disc bulge. Superimposed central disc extrusion migrating caudally appreciated. Mild facet hypertrophy. This results in moderate spinal canal and mild lateral recess and mild neuroforaminal stenosis.  #  L2-L3: Disc desiccation with moderate loss of disc height and a disc bulge. Posterior lateral osteophyte on the LEFT. Bilateral facet hypertrophy. This produces moderate spinal canal/lateral recess and mild neuroforaminal stenosis.  #  L3-L4: Disc desiccation with moderate loss of disc height and a disc bulge. Superimposed central disc protrusion noted. Posterior marginal osteophytes appreciated. Bilateral facet hypertrophy with ligamentum flavum redundancy on the LEFT. This is producing moderate to severe spinal canal/RIGHT lateral  recess narrowing. Moderate LEFT lateral recess narrowing. Mild bilateral neuroforaminal stenosis.  #  L4-L5: Disc desiccation with mild loss of disc height and a disc bulge. Posterior marginal osteophytes appreciated. Bilateral facet hypertrophy and ligamentum flavum redundancy. This results in moderate spinal canal/lateral recess narrowing. Moderate to severe RIGHT and mild LEFT neuroforaminal stenosis.  #  L5-S1: Disc desiccation with severe loss of disc height and a disc bulge. Posterior marginal osteophytes appreciated. Bilateral facet hypertrophy that is greater on the RIGHT. Spinal canal is patent. Moderate bilateral neuroforaminal stenosis.   #  Paraspinal tissues: Unremarkable.   #  Additional comments: None.    IMPRESSION:  1.  Multilevel degenerative disc, facet arthrosis and a sigmoid shape curvature to the thoracolumbar spine, as described above. This is most notable for moderate to severe spinal canal/RIGHT lateral recess narrowing and moderate LEFT lateral recess narrowing with mild bilateral neuroforaminal stenosis at L3-L4.  2.  Moderate spinal canal/lateral recess narrowing and moderate to severe RIGHT and mild LEFT neuroforaminal stenosis at L4-L5.  3.  Moderate spinal canal/lateral recess narrowing and mild neuroforaminal stenosis at L2-L3.  4.  Moderate spinal canal and mild lateral recess narrowing and mild bilateral neuroforaminal stenosis at L1-L2.  5.  Moderate bilateral neuroforaminal stenosis at L5-S1.   Electronically Signed by: [name] on 05/14/2021 1:50 PM   He reports that he has been smoking. He has a 55.00  pack-year smoking history. He has never used smokeless tobacco. No results for input(s): HGBA1C, LABURIC in the last 8760 hours.  Objective:  VS:  HT:     WT:    BMI:      BP:(!) 142/85   HR:77bpm   TEMP: ( )   RESP:  Physical Exam Vitals and nursing note reviewed.  HENT:     Head: Normocephalic and atraumatic.     Right Ear: External ear normal.     Left  Ear: External ear normal.     Nose: Nose normal.     Mouth/Throat:     Mouth: Mucous membranes are moist.  Eyes:     Extraocular Movements: Extraocular movements intact.  Cardiovascular:     Rate and Rhythm: Normal rate.     Pulses: Normal pulses.  Pulmonary:     Effort: Pulmonary effort is normal.  Abdominal:     General: Abdomen is flat.  Musculoskeletal:        General: Tenderness present.     Cervical back: Normal range of motion.     Comments: Pt is slow to rise from seated position to standing. Good lumbar range of motion. Strong distal strength without clonus, no pain upon palpation of greater trochanters. Sensation intact bilaterally. Dysesthesias noted to both right L3/L4 dermatomes. Ambulates with cane, gait slow and unsteady. No swelling noted to right knee.   Skin:    General: Skin is warm and dry.     Capillary Refill: Capillary refill takes less than 2 seconds.  Neurological:     Mental Status: He is alert and oriented to person, place, and time.     Gait: Gait abnormal.  Psychiatric:        Mood and Affect: Mood normal.        Behavior: Behavior normal.    Ortho Exam  Imaging: No results found.  Past Medical/Family/Surgical/Social History: Medications & Allergies reviewed per EMR, new medications updated. Patient Active Problem List   Diagnosis Date Noted   Excessive daytime sleepiness 11/13/2020   Narcotic habituation, continuous ([place]) 11/13/2020   At risk for central sleep apnea 11/13/2020   History of claustrophobia 11/13/2020   Morbid obesity ([place]) 11/13/2020   Nocturia 11/13/2020   PTSD (post-traumatic stress disorder) 11/13/2020   Cognitive and behavioral changes 12/28/2019   Tobacco abuse 12/11/2019   Hypertension    Acute metabolic encephalopathy    Coronary artery disease 02/11/2018   Claudication in peripheral vascular disease ([place]) 02/11/2018   Atypical chest pain 01/12/2018   Precordial chest pain 01/07/2018   Coronary artery calcification  01/07/2018   CKD (chronic kidney disease) stage 3, GFR 30-59 ml/min (HCC) 11/25/2017   Chronic diastolic heart failure ([place]) 05/13/2017   Aortic stenosis, mild 05/13/2017   Low back pain 05/12/2017   GERD (gastroesophageal reflux disease) 05/12/2017   Prostate nodule 05/12/2017   Obesity 05/12/2017   Depression 05/12/2017   Hx of colonic polyps 05/12/2017   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/2017   Type 2 diabetes mellitus ([place]) 05/12/2017   Hyperlipidemia 05/12/2017   Hypertensive heart disease with heart failure ([place]) 05/12/2017   Osteoarthritis of knee 05/12/2017   Sleep apnea 05/12/2017   Actinic keratosis 05/12/2017   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   Seborrheic dermatitis 05/12/2017   Paresthesia 05/12/2017   Past Medical History:  Diagnosis Date   Actinic keratosis 05/12/2017   Back pain    Depression 05/12/2017   Diabetes (HCC)    GERD (gastroesophageal reflux  disease) 05/12/2017   Hx of colonic polyps 05/12/2017   Hyperlipidemia 05/12/2017   Hypertension    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/2017   Low back pain 05/12/2017   Lumbar radiculopathy 05/12/2017   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   Obesity 05/12/2017   Osteoarthritis of knee 05/12/2017   Paresthesia 05/12/2017   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   Prostate nodule 05/12/2017   Seborrheic dermatitis 05/12/2017   Family History  Problem Relation Age of Onset   Hypertension Mother    Hypertension Father    Alzheimer's disease Father    Past Surgical History:  Procedure Laterality Date   HAND SURGERY     REPLACEMENT TOTAL KNEE Right    RIGHT/LEFT HEART CATH AND CORONARY ANGIOGRAPHY N/A 01/12/2018   Procedure: RIGHT/LEFT HEART CATH AND CORONARY ANGIOGRAPHY;  Surgeon: Belva Crome, MD;  Location: [facility];  Service: Cardiovascular;  Laterality: N/A;   SHOULDER SURGERY Right    Social History   Occupational History   Occupation: retired Nature conservation officer  Tobacco Use   Smoking status: Every Day     Packs/day: 1.00    Years: 55.00    Pack years: 55.00    Types: Cigarettes   Smokeless tobacco: Never   Tobacco comments:    Has cut back  Vaping Use   Vaping Use: Never used  Substance and Sexual Activity   Alcohol use: No   Drug use: No    Comment: takes prescriptions as prescribed: oxycodone and methadone   Sexual activity: Never    Birth control/protection: None

## 2021-09-18 NOTE — Progress Notes (Signed)
Pt state lower back pain that travels to his right knee. Pt state his right knee is swore. Pt state any movement makes the pain worse. Pt state he takes pain meds to help ease pain.  Numeric Pain Rating Scale and Functional Assessment Average Pain 10 Pain Right Now 8 My pain is intermittent, sharp, burning, dull, stabbing, tingling, and aching Pain is worse with: walking, bending, sitting, standing, and some activites Pain improves with: medication and injections   In the last MONTH (on 0-10 scale) has pain interfered with the following?  1. General activity like being  able to carry out your everyday physical activities such as walking, climbing stairs, carrying groceries, or moving a chair?  Rating(7)  2. Relation with others like being able to carry out your usual social activities and roles such as  activities at home, at work and in your community. Rating(8)  3. Enjoyment of life such that you have  been bothered by emotional problems such as feeling anxious, depressed or irritable?  Rating(9)

## 2021-09-25 ENCOUNTER — Other Ambulatory Visit: Payer: Self-pay

## 2021-09-26 ENCOUNTER — Encounter: Payer: Self-pay | Admitting: Family Medicine

## 2021-09-26 ENCOUNTER — Ambulatory Visit (INDEPENDENT_AMBULATORY_CARE_PROVIDER_SITE_OTHER): Payer: Medicare Other | Admitting: Family Medicine

## 2021-09-26 VITALS — BP 124/76 | HR 62 | Temp 97.0°F | Ht 68.25 in | Wt 228.4 lb

## 2021-09-26 DIAGNOSIS — M51379 Other intervertebral disc degeneration, lumbosacral region without mention of lumbar back pain or lower extremity pain: Secondary | ICD-10-CM | POA: Insufficient documentation

## 2021-09-26 DIAGNOSIS — I10 Essential (primary) hypertension: Secondary | ICD-10-CM

## 2021-09-26 DIAGNOSIS — R69 Illness, unspecified: Secondary | ICD-10-CM | POA: Insufficient documentation

## 2021-09-26 DIAGNOSIS — I5032 Chronic diastolic (congestive) heart failure: Secondary | ICD-10-CM | POA: Diagnosis not present

## 2021-09-26 DIAGNOSIS — I25119 Atherosclerotic heart disease of native coronary artery with unspecified angina pectoris: Secondary | ICD-10-CM

## 2021-09-26 DIAGNOSIS — E782 Mixed hyperlipidemia: Secondary | ICD-10-CM

## 2021-09-26 DIAGNOSIS — H903 Sensorineural hearing loss, bilateral: Secondary | ICD-10-CM | POA: Insufficient documentation

## 2021-09-26 DIAGNOSIS — Z8614 Personal history of Methicillin resistant Staphylococcus aureus infection: Secondary | ICD-10-CM | POA: Insufficient documentation

## 2021-09-26 DIAGNOSIS — N400 Enlarged prostate without lower urinary tract symptoms: Secondary | ICD-10-CM

## 2021-09-26 DIAGNOSIS — E1140 Type 2 diabetes mellitus with diabetic neuropathy, unspecified: Secondary | ICD-10-CM | POA: Diagnosis not present

## 2021-09-26 DIAGNOSIS — M5137 Other intervertebral disc degeneration, lumbosacral region: Secondary | ICD-10-CM

## 2021-09-26 DIAGNOSIS — F17200 Nicotine dependence, unspecified, uncomplicated: Secondary | ICD-10-CM

## 2021-09-26 NOTE — Progress Notes (Signed)
[name] [address] [place] [address] Dept: [phone] Dept Fax: [phone]  New Patient Office Visit  Subjective:    Patient ID: Phillips Hay, male    DOB: 12-20-1945, 76 y.o..   MRN: [id number]  Chief Complaint  Patient presents with   Establish Care    NP- establish care.   RT knee pain.     History of Present Illness:  Patient is in today to establish care. Mr. Schwartz was born in [place], [place]. He entered the [employer] in 1964 and served for 20 years. 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 Her served on the [employer] Arts development officer). 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 He is a disabled veteran, primarily related to back issues. He married his wife [name]. They have two children (55, 59), four grandchildren, and 1 great grandchild. He smokes about a pack a day. He quit drinking alcohol 10 years ago. He denies drug use.  Mr. Munday has been receiving the majority of his care through the [place]. He feels like he gets inadequate care at times. He likes his PCP there (Dr. Delice Lesch), but notes she is often overbooked or unavailable.  Mr. Kurka has a history of hypertension, CAD, mild aortic stenosis, and hypertensive heart disease with chronic diastolic heart failure. He is managed on losartan and amlodipine and takes a daily aspirin. He also has hyperlipidemia and is on atorvastatin.  Mr. Bernard has history of back pain that he relates to heavy work he did in the service. He also has knee arthritis. He had a prior right total knee joint replacement, but notes the joint was not cleaned adequately at the time of his surgery, so he has some residual issues with his knee. He has been seeing Dr. Ernestina Patches more recently for [facility] of his back. He  is also engaged with Dr. Marlou Sa (orthopedics). He uses Voltaren gel  and tizanidine for pain management. He also takes methadone (#15 per month) and oxycodone (#30 per month). He feels the methadone improves his mental clarity at times. He apparently did have an issue several years ago of an acute delirium related to his opioid use.  Mr. Cristobal has a history of sleep apnea. He has a CPAP machine but does not use it, as he believes a friend of his died form use of such a machine.  Mr. Foerster has a history of Type 2 diabetes. His last A1c was reportedly 8% six months ago. He is managed on metformin and glipizide.  Mr. Rathbone has a history of urinary frequency and nocturia (presumably due to prostatic hypertrophy). He is manage don Proscar and Flomax, but notes he has not had significant improvement in his sleep. He has had nights where he got up 24 times in a night to urinate, but averages 5-6. He notes this is significantly disruptive of his sleep. He also has had falls related to this.  Past Medical History: Patient Active Problem List   Diagnosis Date Noted   Type 2 diabetes mellitus with diabetic neuropathy, unspecified ([place]) 09/26/2021   Sensorineural hearing loss, bilateral 09/26/2021   Personal history of methicillin resistant Staphylococcus aureus 09/26/2021   Degeneration of lumbar or lumbosacral intervertebral disc 09/26/2021   Tobacco dependence 09/26/2021   Excessive daytime sleepiness 11/13/2020  Narcotic habituation, continuous ([place]) 11/13/2020   At risk for central sleep apnea 11/13/2020   History of claustrophobia 11/13/2020   Morbid obesity ([place]) 11/13/2020   Nocturia 11/13/2020   PTSD (post-traumatic stress disorder) 11/13/2020   Cognitive and behavioral changes 12/28/2019   Hypertension    Acute metabolic encephalopathy    Coronary artery disease 02/11/2018   Claudication in peripheral vascular disease ([place]) 02/11/2018   Atypical chest pain 01/12/2018   Coronary artery  calcification 01/07/2018   CKD (chronic kidney disease) stage 3, GFR 30-59 ml/min (HCC) 11/25/2017   Chronic diastolic heart failure ([place]) 05/13/2017   Aortic stenosis, mild 05/13/2017   Low back pain 05/12/2017   GERD (gastroesophageal reflux disease) 05/12/2017   Prostate nodule 05/12/2017   Depression 05/12/2017   Hx of colonic polyps 05/12/2017   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/2017   Hyperlipidemia 05/12/2017   Hypertensive heart disease with heart failure ([place]) 05/12/2017   Osteoarthritis of knee 05/12/2017   Sleep apnea 05/12/2017   Actinic keratosis 05/12/2017   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   Seborrheic dermatitis 05/12/2017   Paresthesia 05/12/2017   Past Surgical History:  Procedure Laterality Date   HAND SURGERY Right    ORIF ANKLE FRACTURE Right    REPLACEMENT TOTAL KNEE Right    RIGHT/LEFT HEART CATH AND CORONARY ANGIOGRAPHY N/A 01/12/2018   Procedure: RIGHT/LEFT HEART CATH AND CORONARY ANGIOGRAPHY;  Surgeon: Belva Crome, MD;  Location: [facility];  Service: Cardiovascular;  Laterality: N/A;   SHOULDER SURGERY Right    Family History  Problem Relation Age of Onset   Hypertension Mother    Hypertension Father    Alzheimer's disease Father    Heart disease Paternal Aunt    Outpatient Medications Prior to Visit  Medication Sig Dispense Refill   amLODipine (NORVASC) 10 MG tablet TAKE ONE TABLET BY MOUTH EVERY DAY FOR BLOOD PRESSURE * NOTE: NEW DOSE *     aspirin 81 MG chewable tablet Chew 81 mg by mouth daily at 12 noon.      atorvastatin (LIPITOR) 80 MG tablet Take 40 mg by mouth at bedtime.      Calcium Carbonate-Vitamin D 600-400 MG-UNIT tablet Take 1 tablet by mouth 2 (two) times daily.     cetirizine (ZYRTEC) 10 MG tablet Take 10 mg by mouth daily as needed for allergies.     diclofenac Sodium (VOLTAREN) 1 % GEL Apply 1 application topically 4 (four) times daily as needed (pain).     DULoxetine (CYMBALTA) 60 MG  capsule Take 60 mg by mouth every evening.      EPINEPHrine 0.3 mg/0.3 mL IJ SOAJ injection Inject 0.3 mg into the muscle daily as needed (for anaphylaxis).      finasteride (PROSCAR) 5 MG tablet Take 5 mg by mouth daily.     glipiZIDE (GLUCOTROL) 5 MG tablet Take 5-10 mg by mouth See admin instructions. Take 2 tablets (10 mg) by mouth daily with breakfast and 1 tablet (5 mg) daily with supper     losartan (COZAAR) 100 MG tablet Take 100 mg by mouth daily. For blood pressure and kidney protection     meloxicam (MOBIC) 15 MG tablet Take 7.5 mg by mouth 2 (two) times daily as needed for pain.      metFORMIN (GLUCOPHAGE) 1000 MG tablet Take 1,000 mg by mouth 2 (two) times daily with a meal.      methadone (DOLOPHINE) 5 MG tablet Take 5 mg by mouth every other  day. For severe chronic pain     omeprazole (PRILOSEC) 40 MG capsule Take 40 mg by mouth daily.     oxyCODONE (OXY IR/ROXICODONE) 5 MG immediate release tablet Take 5 mg by mouth 4 (four) times daily as needed (severe chronic pain).      tamsulosin (FLOMAX) 0.4 MG CAPS capsule TAKE ONE CAPSULE BY MOUTH EVERY NIGHT FOR URINATION/PROSTATE * REPLACES TERAZOSIN *     tiZANidine (ZANAFLEX) 4 MG tablet TAKE ONE TABLET BY MOUTH EVERY EVENING AS NEEDED FOR MUSCLE SPASMS     amoxicillin-clavulanate (AUGMENTIN) 875-125 MG tablet Take 1 tablet by mouth every 12 (twelve) hours. 14 tablet 0   ofloxacin (OCUFLOX) 0.3 % ophthalmic solution Place 1 drop into the left eye 4 (four) times daily. 5 mL 0   No facility-administered medications prior to visit.   Allergies  Allergen Reactions   Wasp Venom Anaphylaxis, Itching and Swelling    Objective:   Today's Vitals   09/26/21 1308  BP: 124/76  Pulse: 62  Temp: (!) 97 F (36.1 C)  TempSrc: Temporal  SpO2: 98%  Weight: 228 lb 6.4 oz (103.6 kg)  Height: 5' 8.25" (1.734 m)   Body mass index is 34.47 kg/m.   General: Well developed, well nourished. No acute distress. Psych: Alert and oriented. Normal  mood and affect.  Health Maintenance Due  Topic Date Due   FOOT EXAM  Never done   OPHTHALMOLOGY EXAM  Never done   Hepatitis C Screening  Never done   HEMOGLOBIN A1C  06/11/2020   INFLUENZA VACCINE  04/07/2021   COVID-19 Vaccine (5 - Booster for Pfizer series) 06/24/2021     Assessment & Plan:   1. Type 2 diabetes mellitus with diabetic neuropathy, without long-term current use of insulin ([place]) I have asked Mr. Reine to sing a record release so we can obtain his [place] records. He has had ongoing care. It is unclear what his recently blood tests may have shown and what preventative care needs he may have related to his diabetes. I will see him back in 1 month to review the status of his conditions and determine what role I might play, along with his [place], in his care. He will continue his metformin and glipizide for now.  2. Primary hypertension Blood pressure is at goal today. Continue losartan and amlodipine.  3. Coronary artery disease involving native coronary artery of native heart with angina pectoris ([place]) Continue a daily aspirin and focus on improving lipids. His smoking places him at high risk for a major cardiovascular event.  4. Chronic diastolic heart failure ([place]) Appears compensated.  5. Degeneration of lumbar or lumbosacral intervertebral disc Currently using Volatern gel, Zanaflex, and on COTS with periodic use of methadone and oxycodone. He will continue to follow with Dr. Ernestina Patches.  6. Mixed hyperlipidemia On atorvastatin. I will obtain records to assess when his last lipids were performed.  7. Tobacco dependence I did advise Mr. Ryser of the benefits of stopping smoking.  8. Prostate hypertrophy Mr. Leaf has significant LUTS. He has failed treatment with a combination of Proscar and Flomax. I suspect he may be a candidate for a TURP. I will refer him to urology for an assessment.  - Ambulatory referral to Urology  I spent 85 minutes reviewing  the patient's available records, taking a history , discussing an intiial strategy for his care, and documenting the encounter.  Haydee Salter, MD

## 2021-10-01 ENCOUNTER — Ambulatory Visit (INDEPENDENT_AMBULATORY_CARE_PROVIDER_SITE_OTHER): Payer: Medicare Other | Admitting: Orthopedic Surgery

## 2021-10-01 ENCOUNTER — Encounter: Payer: Self-pay | Admitting: Family Medicine

## 2021-10-01 ENCOUNTER — Ambulatory Visit (INDEPENDENT_AMBULATORY_CARE_PROVIDER_SITE_OTHER): Payer: Medicare Other

## 2021-10-01 ENCOUNTER — Other Ambulatory Visit: Payer: Self-pay

## 2021-10-01 DIAGNOSIS — I25119 Atherosclerotic heart disease of native coronary artery with unspecified angina pectoris: Secondary | ICD-10-CM | POA: Diagnosis not present

## 2021-10-01 DIAGNOSIS — Z96651 Presence of right artificial knee joint: Secondary | ICD-10-CM

## 2021-10-01 DIAGNOSIS — M48061 Spinal stenosis, lumbar region without neurogenic claudication: Secondary | ICD-10-CM | POA: Insufficient documentation

## 2021-10-01 DIAGNOSIS — M75101 Unspecified rotator cuff tear or rupture of right shoulder, not specified as traumatic: Secondary | ICD-10-CM | POA: Insufficient documentation

## 2021-10-01 DIAGNOSIS — I739 Peripheral vascular disease, unspecified: Secondary | ICD-10-CM | POA: Insufficient documentation

## 2021-10-01 HISTORY — DX: Unspecified rotator cuff tear or rupture of right shoulder, not specified as traumatic: M75.101

## 2021-10-01 NOTE — Progress Notes (Signed)
Office Visit Note   Patient: Thomas [name]           Date of Birth: 29-Apr-1946           MRN: [id number] Visit Date: 10/01/2021 Requested by: [name], MD [address] [place],  [place] [address] PCP: Haydee Salter, MD  Subjective: Chief Complaint  Patient presents with   Right Knee - Follow-up    HPI: [name] is a 76 year old patient with right knee pain.  He is on pain medicine from the [place].  He has been ambulating with a cane.  Has significant right knee pain.  Underwent right total knee replacement several years ago.  This is documented in prior notes.  No recent injuries.  States that his right knee buckles at times.  Feels like it wants to give way.  He is scheduled for epidural steroid injection with Dr. Ernestina Patches this week.  Takes oxycodone for pain which does help.  Would like to discuss surgical options.  Has stress test Friday.  Last 3 days he has done well with minimal pain.              ROS: All systems reviewed are negative as they relate to the chief complaint within the history of present illness.  Patient denies  fevers or chills.   Assessment & Plan: Visit Diagnoses:  1. History of arthroplasty of right knee     Plan: Impression is right knee pain which may be from the back.  No clear-cut indication at this time for revision surgery in terms of aseptic or septic loosening or instability.  Radiographically the knee looks reasonable.  In general we talked about a 50-50 proposition that he improves with surgical intervention and revision surgery.  I think that his odds are still the same.  He is going to hold off on revision surgery for now.  I do think the risks outweigh the potential benefits in his case.  He will follow-up with [place] as needed if his symptoms worsen and he develops constant start up type pain symptoms swelling or other infection symptoms.  Follow-Up Instructions: Return if symptoms worsen or fail to improve.   Orders:  Orders Placed This Encounter   Procedures   XR Knee 1-2 Views Right   No orders of the defined types were placed in this encounter.     Procedures: No procedures performed   Clinical Data: No additional findings.  Objective: Vital Signs: There were no vitals taken for this visit.  Physical Exam:   Constitutional: Patient appears well-developed HEENT:  Head: Normocephalic Eyes:EOM are normal Neck: Normal range of motion Cardiovascular: Normal rate Pulmonary/chest: Effort normal Neurologic: Patient is alert Skin: Skin is warm Psychiatric: Patient has normal mood and affect   Ortho Exam: Ortho exam demonstrates no effusion in the right knee.  Has full extension and flexion to about 95.  Collaterals are stable to varus valgus stress at 0 30 and 90 degrees.  Mild edema bilateral lower extremities.  No nerve root tension signs on the right.  No groin pain on the right with internal or external rotation.  Specialty Comments:  No specialty comments available.  Imaging: XR Knee 1-2 Views Right  Result Date: 10/01/2021 AP lateral radiographs right knee reviewed.  Cemented total knee prosthesis in good position alignment with no complicating features.  No evidence of lucencies or loosening of either the patellofemoral or tibial component.  No acute fracture.    PMFS History: Patient Active Problem List  Diagnosis Date Noted   Spinal stenosis at L4-L5 level 10/01/2021   Peripheral arterial disease ([facility]) 10/01/2021   Type 2 diabetes mellitus with diabetic neuropathy, unspecified ([facility]) 09/26/2021   Sensorineural hearing loss, bilateral 09/26/2021   Personal history of methicillin resistant Staphylococcus aureus 09/26/2021   Degeneration of lumbar or lumbosacral intervertebral disc 09/26/2021   Tobacco dependence 09/26/2021   Excessive daytime sleepiness 11/13/2020   Narcotic habituation, continuous ([facility]) 11/13/2020   At risk for central sleep apnea 11/13/2020   History of claustrophobia 11/13/2020    Obesity (BMI 30.0-34.9) 11/13/2020   Nocturia 11/13/2020   PTSD (post-traumatic stress disorder) 11/13/2020   Cognitive and behavioral changes 12/28/2019   Hypertension    Acute metabolic encephalopathy    Coronary artery disease 02/11/2018   Claudication in peripheral vascular disease ([facility]) 02/11/2018   Atypical chest pain 01/12/2018   Coronary artery calcification 01/07/2018   CKD (chronic kidney disease) stage 3, GFR 30-59 ml/min ([facility]) 11/25/2017   Chronic diastolic heart failure ([facility]) 05/13/2017   Aortic stenosis, mild 05/13/2017   Low back pain 05/12/2017   GERD (gastroesophageal reflux disease) 05/12/2017   Prostate nodule 05/12/2017   Depression 05/12/2017   Hx of colonic polyps 05/12/2017   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/2017   Hyperlipidemia 05/12/2017   Hypertensive heart disease with heart failure ([facility]) 05/12/2017   Osteoarthritis of knee 05/12/2017   Sleep apnea 05/12/2017   Actinic keratosis 05/12/2017   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   Seborrheic dermatitis 05/12/2017   Paresthesia 05/12/2017   Past Medical History:  Diagnosis Date   Actinic keratosis 05/12/2017   Back pain    Depression 05/12/2017   Diabetes ([facility])    GERD (gastroesophageal reflux disease) 05/12/2017   Hx of colonic polyps 05/12/2017   Hyperlipidemia 05/12/2017   Hypertension    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/2017   Low back pain 05/12/2017   Lumbar radiculopathy 05/12/2017   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   Obesity 05/12/2017   Osteoarthritis of knee 05/12/2017   Paresthesia 05/12/2017   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   Prostate nodule 05/12/2017   Right rotator cuff tear 10/01/2021   Seborrheic dermatitis 05/12/2017    Family History  Problem Relation Age of Onset   Hypertension Mother    Hypertension Father    Alzheimer's disease Father    Heart disease Paternal Aunt     Past Surgical History:  Procedure Laterality Date   HAND SURGERY Right    ORIF ANKLE FRACTURE Left  08/15/2007   Distal fibular   REPLACEMENT TOTAL KNEE Right 07/12/2012   RIGHT/LEFT HEART CATH AND CORONARY ANGIOGRAPHY N/A 01/12/2018   Procedure: RIGHT/LEFT HEART CATH AND CORONARY ANGIOGRAPHY;  Surgeon: Belva Crome, MD;  Location: [facility];  Service: Cardiovascular;  Laterality: N/A;   SHOULDER SURGERY Right    UMBILICAL HERNIA REPAIR  06/23/2013   Social History   Occupational History   Occupation: retired Nature conservation officer  Tobacco Use   Smoking status: Every Day    Packs/day: 1.00    Years: 55.00    Pack years: 55.00    Types: Cigarettes   Smokeless tobacco: Never   Tobacco comments:    Has cut back  Vaping Use   Vaping Use: Never used  Substance and Sexual Activity   Alcohol use: No   Drug use: No    Comment: takes prescriptions as prescribed: oxycodone and methadone   Sexual activity: Never    Birth control/protection: None

## 2021-10-02 ENCOUNTER — Ambulatory Visit: Payer: Medicare Other

## 2021-10-02 ENCOUNTER — Ambulatory Visit (INDEPENDENT_AMBULATORY_CARE_PROVIDER_SITE_OTHER): Payer: Medicare Other | Admitting: Physical Medicine and Rehabilitation

## 2021-10-02 ENCOUNTER — Encounter: Payer: Self-pay | Admitting: Physical Medicine and Rehabilitation

## 2021-10-02 VITALS — BP 182/92 | HR 93

## 2021-10-02 DIAGNOSIS — M5416 Radiculopathy, lumbar region: Secondary | ICD-10-CM

## 2021-10-02 MED ORDER — METHYLPREDNISOLONE ACETATE 80 MG/ML IJ SUSP
80.0000 mg | Freq: Once | INTRAMUSCULAR | Status: AC
  Administered 2021-10-02: 80 mg

## 2021-10-02 NOTE — Progress Notes (Signed)
Pt state lower back pain that travels to his right knee. Pt state his right knee is swore. Pt state any movement makes the pain worse. Pt state he takes pain meds to help ease pain.  Numeric Pain Rating Scale and Functional Assessment Average Pain 8   In the last MONTH (on 0-10 scale) has pain interfered with the following?  1. General activity like being  able to carry out your everyday physical activities such as walking, climbing stairs, carrying groceries, or moving a chair?  Rating(10)   +Driver, -BT, -Dye Allergies.

## 2021-10-02 NOTE — Patient Instructions (Signed)

## 2021-10-03 NOTE — Procedures (Signed)
Lumbosacral Transforaminal Epidural Steroid Injection - Sub-Pedicular Approach with Fluoroscopic Guidance  Patient: Thomas [name]      Date of Birth: [birth year] MRN: [id number] PCP: Haydee Salter, MD      Visit Date: 10/02/2021   Universal Protocol:    Date/Time: 10/02/2021  Consent Given By: the patient  Position: PRONE  Additional Comments: Vital signs were monitored before and after the procedure. Patient was prepped and draped in the usual sterile fashion. The correct patient, procedure, and site was verified.   Injection Procedure Details:   Procedure diagnoses: Lumbar radiculopathy [M54.16]    Meds Administered:  Meds ordered this encounter  Medications   methylPREDNISolone acetate (DEPO-MEDROL) injection 80 mg    Laterality: Right  Location/Site: L3  Needle:5.0 in., 22 ga.  Short bevel or Quincke spinal needle  Needle Placement: Transforaminal  Findings:    -Comments: Excellent flow of contrast along the nerve, nerve root and into the epidural space.  Procedure Details: After squaring off the end-plates to get a true AP view, the C-arm was positioned so that an oblique view of the foramen as noted above was visualized. The target area is just inferior to the "nose of the scotty dog" or sub pedicular. The soft tissues overlying this structure were infiltrated with 2-3 ml. of 1% Lidocaine without Epinephrine.  The spinal needle was inserted toward the target using a "trajectory" view along the fluoroscope beam.  Under AP and lateral visualization, the needle was advanced so it did not puncture dura and was located close the 6 O'Clock position of the pedical in AP tracterory. Biplanar projections were used to confirm position. Aspiration was confirmed to be negative for CSF and/or blood. A 1-2 ml. volume of Isovue-250 was injected and flow of contrast was noted at each level. Radiographs were obtained for documentation purposes.   After attaining the desired flow  of contrast documented above, a 0.5 to 1.0 ml test dose of 0.25% Marcaine was injected into each respective transforaminal space.  The patient was observed for 90 seconds post injection.  After no sensory deficits were reported, and normal lower extremity motor function was noted,   the above injectate was administered so that equal amounts of the injectate were placed at each foramen (level) into the transforaminal epidural space.   Additional Comments:  No complications occurred Dressing: 2 x 2 sterile gauze and Band-Aid    Post-procedure details: Patient was observed during the procedure. Post-procedure instructions were reviewed.  Patient left the clinic in stable condition.

## 2021-10-03 NOTE — Progress Notes (Signed)
Thomas [name] - 76 y.o. male MRN [id number]  Date of birth: 10-06-1945  Office Visit Note: Visit Date: 10/02/2021 PCP: Haydee Salter, MD Referred by: [name], MD  Subjective: Chief Complaint  Patient presents with   Lower Back - Pain   Right Knee - Pain   HPI:  Thomas [name] is a 76 y.o. male who comes in today at the request of Barnet Pall, FNP for planned Right L3-4 Lumbar Transforaminal epidural steroid injection with fluoroscopic guidance.  The patient has failed conservative care including home exercise, medications, time and activity modification.  This injection will be diagnostic and hopefully therapeutic.  Please see requesting physician notes for further details and justification.  ROS Otherwise per HPI.  Assessment & Plan: Visit Diagnoses:    ICD-10-CM   1. Lumbar radiculopathy  M54.16 XR C-ARM NO REPORT    Epidural Steroid injection    methylPREDNISolone acetate (DEPO-MEDROL) injection 80 mg      Plan: No additional findings.   Meds & Orders:  Meds ordered this encounter  Medications   methylPREDNISolone acetate (DEPO-MEDROL) injection 80 mg    Orders Placed This Encounter  Procedures   XR C-ARM NO REPORT   Epidural Steroid injection    Follow-up: Return if symptoms worsen or fail to improve.   Procedures: No procedures performed  Lumbosacral Transforaminal Epidural Steroid Injection - Sub-Pedicular Approach with Fluoroscopic Guidance  Patient: Thomas [name]      Date of Birth: 1946-08-10 MRN: [id number] PCP: Haydee Salter, MD      Visit Date: 10/02/2021   Universal Protocol:    Date/Time: 10/02/2021  Consent Given By: the patient  Position: PRONE  Additional Comments: Vital signs were monitored before and after the procedure. Patient was prepped and draped in the usual sterile fashion. The correct patient, procedure, and site was verified.   Injection Procedure Details:   Procedure diagnoses: Lumbar radiculopathy [M54.16]     Meds Administered:  Meds ordered this encounter  Medications   methylPREDNISolone acetate (DEPO-MEDROL) injection 80 mg    Laterality: Right  Location/Site: L3  Needle:5.0 in., 22 ga.  Short bevel or Quincke spinal needle  Needle Placement: Transforaminal  Findings:    -Comments: Excellent flow of contrast along the nerve, nerve root and into the epidural space.  Procedure Details: After squaring off the end-plates to get a true AP view, the C-arm was positioned so that an oblique view of the foramen as noted above was visualized. The target area is just inferior to the "nose of the scotty dog" or sub pedicular. The soft tissues overlying this structure were infiltrated with 2-3 ml. of 1% Lidocaine without Epinephrine.  The spinal needle was inserted toward the target using a "trajectory" view along the fluoroscope beam.  Under AP and lateral visualization, the needle was advanced so it did not puncture dura and was located close the 6 O'Clock position of the pedical in AP tracterory. Biplanar projections were used to confirm position. Aspiration was confirmed to be negative for CSF and/or blood. A 1-2 ml. volume of Isovue-250 was injected and flow of contrast was noted at each level. Radiographs were obtained for documentation purposes.   After attaining the desired flow of contrast documented above, a 0.5 to 1.0 ml test dose of 0.25% Marcaine was injected into each respective transforaminal space.  The patient was observed for 90 seconds post injection.  After no sensory deficits were reported, and normal lower extremity motor function was noted,  the above injectate was administered so that equal amounts of the injectate were placed at each foramen (level) into the transforaminal epidural space.   Additional Comments:  No complications occurred Dressing: 2 x 2 sterile gauze and Band-Aid    Post-procedure details: Patient was observed during the procedure. Post-procedure  instructions were reviewed.  Patient left the clinic in stable condition.    Clinical History: [name], MD - 05/14/2021  Formatting of this note might be different from the original.  INDICATION: Radiculopathy, lumbar region. Low back pain radiating down the RIGHT leg with numbness and tingling. Symptoms for 3 to 4 years, worsening the past year. History of several falls.   STUDY: MRI of the lumbar spine without intravenous contrast performed on 05/13/2021 5:36 PM.   COMPARISON: No comparison available.   TECHNIQUE: Multiplanar, multisequence MR imaging obtained through the lumbar spine without contrast on 05/13/2021 5:36 PM.   CONTRAST: None.   FINDINGS:  #  Osseous structures: Anterolateral osteophytes noted at multiple levels. There is a Schmorl's nodes in the superior endplates of the I33 and L5 vertebral bodies. Modic type II changes seen in superior plate of the A25 vertebral body. No evidence of an acute fracture.  #  Alignment:There is a sigmoid shape to the thoracolumbar spine. Grade 1 retrolisthesis at L2-L3.  #  Conus medullaris/cauda equina: Spinal cord signal is normal and terminates at L1-L2. The cauda equina is unremarkable.   #  Lower thoracic spine: Disc desiccation with a small posterior central disc protrusion effacing the ventral thecal sac at T11-T12. Neuroforamen are patent. This is viewed on the sagittal images only.   #  T12-L1: Disc desiccation without a significant disc herniation, spinal canal, or neuroforaminal compromise. This is viewed on the sagittal images only.  #  L1-L2: Disc desiccation with moderate loss of disc height and a disc bulge. Superimposed central disc extrusion migrating caudally appreciated. Mild facet hypertrophy. This results in moderate spinal canal and mild lateral recess and mild neuroforaminal stenosis.  #  L2-L3: Disc desiccation with moderate loss of disc height and a disc bulge. Posterior lateral osteophyte on the LEFT. Bilateral  facet hypertrophy. This produces moderate spinal canal/lateral recess and mild neuroforaminal stenosis.  #  L3-L4: Disc desiccation with moderate loss of disc height and a disc bulge. Superimposed central disc protrusion noted. Posterior marginal osteophytes appreciated. Bilateral facet hypertrophy with ligamentum flavum redundancy on the LEFT. This is producing moderate to severe spinal canal/RIGHT lateral recess narrowing. Moderate LEFT lateral recess narrowing. Mild bilateral neuroforaminal stenosis.  #  L4-L5: Disc desiccation with mild loss of disc height and a disc bulge. Posterior marginal osteophytes appreciated. Bilateral facet hypertrophy and ligamentum flavum redundancy. This results in moderate spinal canal/lateral recess narrowing. Moderate to severe RIGHT and mild LEFT neuroforaminal stenosis.  #  L5-S1: Disc desiccation with severe loss of disc height and a disc bulge. Posterior marginal osteophytes appreciated. Bilateral facet hypertrophy that is greater on the RIGHT. Spinal canal is patent. Moderate bilateral neuroforaminal stenosis.   #  Paraspinal tissues: Unremarkable.   #  Additional comments: None.    IMPRESSION:  1.  Multilevel degenerative disc, facet arthrosis and a sigmoid shape curvature to the thoracolumbar spine, as described above. This is most notable for moderate to severe spinal canal/RIGHT lateral recess narrowing and moderate LEFT lateral recess narrowing with mild bilateral neuroforaminal stenosis at L3-L4.  2.  Moderate spinal canal/lateral recess narrowing and moderate to severe RIGHT and mild LEFT neuroforaminal stenosis at L4-L5.  3.  Moderate spinal canal/lateral recess narrowing and mild neuroforaminal stenosis at L2-L3.  4.  Moderate spinal canal and mild lateral recess narrowing and mild bilateral neuroforaminal stenosis at L1-L2.  5.  Moderate bilateral neuroforaminal stenosis at L5-S1.   Electronically Signed by: [name] on 05/14/2021 1:50 PM      Objective:  VS:  HT:     WT:    BMI:      BP:(!) 182/92   HR:93bpm   TEMP: ( )   RESP:  Physical Exam Vitals and nursing note reviewed.  Constitutional:      General: He is not in acute distress.    Appearance: Normal appearance. He is obese. He is not ill-appearing.  HENT:     Head: Normocephalic and atraumatic.     Right Ear: External ear normal.     Left Ear: External ear normal.     Nose: No congestion.  Eyes:     Extraocular Movements: Extraocular movements intact.  Cardiovascular:     Rate and Rhythm: Normal rate.     Pulses: Normal pulses.  Pulmonary:     Effort: Pulmonary effort is normal. No respiratory distress.  Abdominal:     General: There is no distension.     Palpations: Abdomen is soft.  Musculoskeletal:        General: No tenderness or signs of injury.     Cervical back: Neck supple.     Right lower leg: No edema.     Left lower leg: No edema.     Comments: Patient has good distal strength without clonus.  Skin:    Findings: No erythema or rash.  Neurological:     General: No focal deficit present.     Mental Status: He is alert and oriented to person, place, and time.     Sensory: No sensory deficit.     Motor: No weakness or abnormal muscle tone.     Coordination: Coordination normal.  Psychiatric:        Mood and Affect: Mood normal.        Behavior: Behavior normal.     Imaging: XR C-ARM NO REPORT  Result Date: 10/02/2021 Please see Notes tab for imaging impression.

## 2021-10-14 ENCOUNTER — Encounter: Payer: Self-pay | Admitting: Family Medicine

## 2021-10-17 DIAGNOSIS — R351 Nocturia: Secondary | ICD-10-CM | POA: Diagnosis not present

## 2021-10-17 DIAGNOSIS — N401 Enlarged prostate with lower urinary tract symptoms: Secondary | ICD-10-CM | POA: Diagnosis not present

## 2021-10-17 DIAGNOSIS — R350 Frequency of micturition: Secondary | ICD-10-CM | POA: Diagnosis not present

## 2021-10-22 ENCOUNTER — Ambulatory Visit (INDEPENDENT_AMBULATORY_CARE_PROVIDER_SITE_OTHER): Payer: Medicare Other | Admitting: Family Medicine

## 2021-10-22 ENCOUNTER — Encounter: Payer: Self-pay | Admitting: Family Medicine

## 2021-10-22 ENCOUNTER — Other Ambulatory Visit: Payer: Self-pay

## 2021-10-22 VITALS — BP 134/76 | HR 70 | Temp 98.8°F | Ht 68.5 in | Wt 226.6 lb

## 2021-10-22 DIAGNOSIS — I10 Essential (primary) hypertension: Secondary | ICD-10-CM | POA: Diagnosis not present

## 2021-10-22 DIAGNOSIS — E1140 Type 2 diabetes mellitus with diabetic neuropathy, unspecified: Secondary | ICD-10-CM | POA: Diagnosis not present

## 2021-10-22 DIAGNOSIS — I25119 Atherosclerotic heart disease of native coronary artery with unspecified angina pectoris: Secondary | ICD-10-CM

## 2021-10-22 DIAGNOSIS — I5032 Chronic diastolic (congestive) heart failure: Secondary | ICD-10-CM | POA: Diagnosis not present

## 2021-10-22 DIAGNOSIS — R918 Other nonspecific abnormal finding of lung field: Secondary | ICD-10-CM | POA: Diagnosis not present

## 2021-10-22 DIAGNOSIS — R911 Solitary pulmonary nodule: Secondary | ICD-10-CM | POA: Diagnosis not present

## 2021-10-22 DIAGNOSIS — R296 Repeated falls: Secondary | ICD-10-CM

## 2021-10-22 DIAGNOSIS — Z23 Encounter for immunization: Secondary | ICD-10-CM | POA: Diagnosis not present

## 2021-10-22 LAB — GLUCOSE, POCT (MANUAL RESULT ENTRY): POC Glucose: 241 mg/dl — AB (ref 70–99)

## 2021-10-22 LAB — POCT GLYCOSYLATED HEMOGLOBIN (HGB A1C): Hemoglobin A1C: 8.8 % — AB (ref 4.0–5.6)

## 2021-10-22 MED ORDER — EMPAGLIFLOZIN 10 MG PO TABS: 10.0000 mg | ORAL_TABLET | Freq: Every day | ORAL | 11 refills | Status: DC

## 2021-10-22 NOTE — Progress Notes (Signed)
[facility] [address] [place] [address] Dept: [phone] Dept Fax: [phone]  Chronic Care Office Visit  Subjective:    Patient ID: Thomas [name], male    DOB: 07-07-1946, 76 y.o..   MRN: [id number]  Chief Complaint  Patient presents with   Follow-up    1 month f/u.      History of Present Illness:  Patient is in today for reassessment of chronic medical issues.  At his last visit, I asked Thomas [name] to sing a release of records from the [place]. Today's visit was to continue assessment of his current health concerns and to devise a plan for ongoing management.  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 He is managed on losartan and amlodipine and takes a daily aspirin. He also has hyperlipidemia and is on atorvastatin. he brought with him a letter form his [place] physician that notes he had a recent myocardial perfusion scan which showed some reversible, apical ischemic changes in the LAD distribution, a moderately dilated left ventricle with a post-stress EF= 26-32% with global hypokinesis. There is a noted recommendation for a follow-up echocardiogram. He also was found to have some small pleural effusions and a chronic moderate-sized pericardial effusion. Thomas [name] has an appointment next month with Dr. Bettina Gavia (cardiology). He does note some mild edema of both lower legs.   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 He also has knee arthritis. He had a prior right total knee joint replacement, with some residual issues with his knee. Since his last visit, he saw Dr. Marlou Sa, who has recommend observation for now.    Thomas [name] has a history of Type 2 diabetes. His last A1c was reportedly 8% six months ago. He is managed on metformin and glipizide. He notes he was recently found to have a blood  sugar of 400.    An additional finding documented in the letter from Dr. Delice Lesch was a new right lower lobe nodule measuring 8 mm. Radiology had recommended this be repeat in 6 weeks. However, the [place] would be unable to do this prior to April 18th. He asks about getting this done sooner in the private sector.  Thomas [name] and his wife bring up him having issues with frequent falls. This is in part due to his knee arthritis, however, there may also be some issues related to his heart failure and his blood sugars. He did some PT last year which was helpful, but this has been a while.  Past Medical History: Patient Active Problem List   Diagnosis Date Noted   Spinal stenosis at L4-L5 level 10/01/2021   Peripheral arterial disease ([place]) 10/01/2021   Type 2 diabetes mellitus with diabetic neuropathy, unspecified ([place]) 09/26/2021   Sensorineural hearing loss, bilateral 09/26/2021   Personal history of methicillin resistant Staphylococcus aureus 09/26/2021   Degeneration of lumbar or lumbosacral intervertebral disc 09/26/2021   Tobacco dependence 09/26/2021   Excessive daytime sleepiness 11/13/2020   Narcotic habituation, continuous ([place]) 11/13/2020   At risk for central sleep apnea 11/13/2020   History of claustrophobia 11/13/2020   Obesity (BMI 30.0-34.9) 11/13/2020   Nocturia 11/13/2020   PTSD (post-traumatic stress disorder) 11/13/2020   Cognitive and behavioral changes 12/28/2019   Essential hypertension    Acute metabolic encephalopathy    Coronary artery disease 02/11/2018   Claudication  in peripheral vascular disease ([place]) 02/11/2018   Atypical chest pain 01/12/2018   Coronary artery calcification 01/07/2018   CKD (chronic kidney disease) stage 3, GFR 30-59 ml/min (HCC) 11/25/2017   Chronic diastolic heart failure ([place]) 05/13/2017   Aortic stenosis, mild 05/13/2017   Low back pain 05/12/2017   GERD (gastroesophageal reflux disease) 05/12/2017   Prostate nodule 05/12/2017   Depression  05/12/2017   Hx of colonic polyps 05/12/2017   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/2017   Hyperlipidemia 05/12/2017   Hypertensive heart disease with heart failure ([place]) 05/12/2017   Osteoarthritis of knee 05/12/2017   Sleep apnea 05/12/2017   Actinic keratosis 05/12/2017   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   Seborrheic dermatitis 05/12/2017   Paresthesia 05/12/2017   Past Surgical History:  Procedure Laterality Date   HAND SURGERY Right    ORIF ANKLE FRACTURE Left 08/15/2007   Distal fibular   REPLACEMENT TOTAL KNEE Right 07/12/2012   RIGHT/LEFT HEART CATH AND CORONARY ANGIOGRAPHY N/A 01/12/2018   Procedure: RIGHT/LEFT HEART CATH AND CORONARY ANGIOGRAPHY;  Surgeon: Belva Crome, MD;  Location: [facility];  Service: Cardiovascular;  Laterality: N/A;   SHOULDER SURGERY Right    UMBILICAL HERNIA REPAIR  06/23/2013   Family History  Problem Relation Age of Onset   Hypertension Mother    Hypertension Father    Alzheimer's disease Father    Heart disease Paternal Aunt    Outpatient Medications Prior to Visit  Medication Sig Dispense Refill   amLODipine (NORVASC) 10 MG tablet TAKE ONE TABLET BY MOUTH EVERY DAY FOR BLOOD PRESSURE * NOTE: NEW DOSE *     aspirin 81 MG chewable tablet Chew 81 mg by mouth daily at 12 noon.      atorvastatin (LIPITOR) 80 MG tablet Take 40 mg by mouth at bedtime.      Calcium Carbonate-Vitamin D 600-400 MG-UNIT tablet Take 1 tablet by mouth 2 (two) times daily.     cetirizine (ZYRTEC) 10 MG tablet Take 10 mg by mouth daily as needed for allergies.     diclofenac Sodium (VOLTAREN) 1 % GEL Apply 1 application topically 4 (four) times daily as needed (pain).     DULoxetine (CYMBALTA) 60 MG capsule Take 60 mg by mouth every evening.      EPINEPHrine 0.3 mg/0.3 mL IJ SOAJ injection Inject 0.3 mg into the muscle daily as needed (for anaphylaxis).      finasteride (PROSCAR) 5 MG tablet Take 5 mg by mouth daily.     losartan  (COZAAR) 100 MG tablet Take 100 mg by mouth daily. For blood pressure and kidney protection     meloxicam (MOBIC) 15 MG tablet Take 7.5 mg by mouth 2 (two) times daily as needed for pain.      metFORMIN (GLUCOPHAGE) 1000 MG tablet Take 1,000 mg by mouth 2 (two) times daily with a meal.      methadone (DOLOPHINE) 5 MG tablet Take 5 mg by mouth every other day. For severe chronic pain     omeprazole (PRILOSEC) 40 MG capsule Take 40 mg by mouth daily.     oxybutynin (DITROPAN) 5 MG tablet Take 5 mg by mouth at bedtime.     oxyCODONE (OXY IR/ROXICODONE) 5 MG immediate release tablet Take 5 mg by mouth 4 (four) times daily as needed (severe chronic pain).      tamsulosin (FLOMAX) 0.4 MG CAPS capsule TAKE ONE CAPSULE BY MOUTH EVERY NIGHT FOR URINATION/PROSTATE * REPLACES TERAZOSIN *  tiZANidine (ZANAFLEX) 4 MG tablet TAKE ONE TABLET BY MOUTH EVERY EVENING AS NEEDED FOR MUSCLE SPASMS     glipiZIDE (GLUCOTROL) 5 MG tablet Take 5-10 mg by mouth See admin instructions. Take 2 tablets (10 mg) by mouth daily with breakfast and 1 tablet (5 mg) daily with supper     No facility-administered medications prior to visit.   Allergies  Allergen Reactions   Wasp Venom Anaphylaxis, Itching and Swelling     Objective:   Today's Vitals   10/22/21 1302  BP: 134/76  Pulse: 70  Temp: 98.8 F (37.1 C)  TempSrc: Temporal  SpO2: 98%  Weight: 226 lb 9.6 oz (102.8 kg)  Height: 5' 8.5" (1.74 m)   Body mass index is 33.95 kg/m.   General: Well developed, well nourished. No acute distress. Psych: Alert and oriented. Normal mood and affect.  Health Maintenance Due  Topic Date Due   FOOT EXAM  Never done   Hepatitis C Screening  Never done   COVID-19 Vaccine (5 - Booster for Pfizer series) 06/24/2021    Lab Results POC Glucose: 241 mg/dL POC HgB A1c: 8.8%    Assessment & Plan:   1. Type 2 diabetes mellitus with diabetic neuropathy, without long-term current use of insulin [facility]) Mr. Schwinn diabetes  is not well controlled. Glipizide would be riskier for possible hypoglycemia. In light of his history of HFrEF, I would recommend we switch is his glipizide to empagliflozin. I will then plan a repeat A1c in 3 months. I have asked him to check his fasting glucoses, with a goal of getting this under 120.  - POCT Glucose (CBG) - POCT HgB A1C - empagliflozin (JARDIANCE) 10 MG TABS tablet; Take 1 tablet (10 mg total) by mouth daily before breakfast.  Dispense: 30 tablet; Refill: 11  2. Chronic diastolic heart failure (HCC) It is very reasonable that he follow-up with Dr. Bettina Gavia in regard to his HFrEF. In light of his diabetes as well, I will be changing him to an SGLT2i, as noted above. This should also benefit his heart.  - empagliflozin (JARDIANCE) 10 MG TABS tablet; Take 1 tablet (10 mg total) by mouth daily before breakfast.  Dispense: 30 tablet; Refill: 11  3. Coronary artery disease involving native coronary artery of native heart with angina pectoris (HCC) Stable. Continue daily aspirin.  4. Essential hypertension Blood pressure is adequately controlled today. Continue losartan.  5. Frequent falls Mr. [place]  recent falls are likely multifactorial. I do think ti would be helpful to renew his PT for strengthening and balance.  - Ambulatory referral to Physical Therapy  6. Incidental pulmonary nodule, greater than or equal to 25m 7. Abnormal findings on diagnostic imaging of lung I will refer Mr. TKamarafor the CT that has been recommended to follow up on the right lower lung nodule.  - CT Chest Wo Contrast; Future  8. Need for influenza vaccination  - Flu Vaccine QUAD High Dose(Fluad)  SHaydee Salter MD

## 2021-10-27 ENCOUNTER — Ambulatory Visit: Payer: No Typology Code available for payment source | Admitting: Family Medicine

## 2021-10-29 ENCOUNTER — Ambulatory Visit: Payer: Medicare Other | Admitting: Family Medicine

## 2021-10-29 ENCOUNTER — Telehealth: Payer: Self-pay | Admitting: Family Medicine

## 2021-10-29 NOTE — Telephone Encounter (Signed)
Patient/Caregiver was notified of No Show/Late Cancellation Policy & possible $48 charge. Visit was cancelled with reason "No Show/Cancel within 24 hours" for tracking & charging.  Caller Name: Thomas [name] Ph #: [phone] Date of APPT: today 10/29/21 Reason given for no show/late cancellation:   she just found out he does not need this for an upcoming procedure  ~~~Route message to admin supervisor and clinical team/CMA~~~

## 2021-10-31 ENCOUNTER — Ambulatory Visit (HOSPITAL_BASED_OUTPATIENT_CLINIC_OR_DEPARTMENT_OTHER)
Admission: RE | Admit: 2021-10-31 | Discharge: 2021-10-31 | Disposition: A | Discharge status: Home / Self Care | Payer: Medicare Other | Source: Ambulatory Visit | Attending: Family Medicine | Admitting: Family Medicine

## 2021-10-31 ENCOUNTER — Other Ambulatory Visit: Payer: Self-pay

## 2021-10-31 DIAGNOSIS — R918 Other nonspecific abnormal finding of lung field: Secondary | ICD-10-CM | POA: Insufficient documentation

## 2021-10-31 DIAGNOSIS — R590 Localized enlarged lymph nodes: Secondary | ICD-10-CM | POA: Diagnosis not present

## 2021-10-31 DIAGNOSIS — R911 Solitary pulmonary nodule: Secondary | ICD-10-CM | POA: Diagnosis not present

## 2021-10-31 DIAGNOSIS — J9811 Atelectasis: Secondary | ICD-10-CM | POA: Diagnosis not present

## 2021-10-31 DIAGNOSIS — I3139 Other pericardial effusion (noninflammatory): Secondary | ICD-10-CM | POA: Diagnosis not present

## 2021-11-03 DIAGNOSIS — R296 Repeated falls: Secondary | ICD-10-CM | POA: Diagnosis not present

## 2021-11-03 DIAGNOSIS — R262 Difficulty in walking, not elsewhere classified: Secondary | ICD-10-CM | POA: Diagnosis not present

## 2021-11-04 ENCOUNTER — Encounter: Payer: Self-pay | Admitting: Family Medicine

## 2021-11-04 DIAGNOSIS — R911 Solitary pulmonary nodule: Secondary | ICD-10-CM | POA: Insufficient documentation

## 2021-11-04 DIAGNOSIS — J811 Chronic pulmonary edema: Secondary | ICD-10-CM | POA: Insufficient documentation

## 2021-11-04 DIAGNOSIS — J90 Pleural effusion, not elsewhere classified: Secondary | ICD-10-CM | POA: Insufficient documentation

## 2021-11-07 DIAGNOSIS — R262 Difficulty in walking, not elsewhere classified: Secondary | ICD-10-CM | POA: Diagnosis not present

## 2021-11-07 DIAGNOSIS — R296 Repeated falls: Secondary | ICD-10-CM | POA: Diagnosis not present

## 2021-11-08 NOTE — Progress Notes (Addendum)
Cardiology Office Note:    Date:  11/11/2021   ID:  Thomas [name], DOB [birth year], MRN [id number]  PCP:  Thomas Salter, MD  Cardiologist:  Thomas More, MD    Referring MD: Thomas Salter, MD    ASSESSMENT:    1. LV dysfunction   2. Hypertensive heart disease with heart failure ([place])   3. Coronary artery disease involving native coronary artery of native heart with angina pectoris ([place])   4. Aortic stenosis, mild   5. Mixed hyperlipidemia    PLAN:    In order of problems listed above:  Is a very difficult case because of access issues however it appears as if he has severe left ventricular dysfunction we will take this as an opportunity to transition from ARB to Entresto his wife will take a screenshot of the echo report when they get home and send it to me continue SGLT2 inhibitor and avoid a beta-blocker with his bronchospasm and for now we will hold on MRA with his CKD and ongoing problems with urinary frequency and nocturia being seen by urology in the [place] system.  I will see him back in the office in 6 weeks with better access to information diagnostic testing for up titration.  I do not think he requires a loop diuretic at this time Stable CAD continue medical treatment hopefully I will be able to see the results of his perfusion study right [name] than just comment about ejection fraction Stable mild aortic stenosis await results of his echocardiogram Continue with statin labs follow-up in the [place] system   Next appointment: 6 weeks   Medication Adjustments/Labs and Tests Ordered: Current medicines are reviewed at length with the patient today.  Concerns regarding medicines are outlined above.  Orders Placed This Encounter  Procedures   EKG 12-Lead   No orders of the defined types were placed in this encounter.   Chief Complaint  Patient presents with   Cardiomyopathy  I also had an echocardiogram done last week  History of Present Illness:    Thomas [name] is a 76  y.o. male with a hx of heart failure hypertensive heart disease aortic stenosis last seen by me 01/07/2018.  Since then he is followed with Thomas [name].  He underwent right and left heart catheterization 01/12/2018 which showed mild CAD most severe proximal right coronary artery 50% his hemodynamics showed elevated pulmonary artery pressure 36/19 mean 25 pulmonary capillary wedge pressure modestly elevated at 17 and a peak to peak gradient across the aortic valve 11 mmHg.  Had normal left ventricular ejection fraction 50 to 55%.  I was sent 185 pages of records from the [facility] system dating back to 2008 and I do not see any cardiology records or diagnostic testing to review.  [facility] is [facility] that he had a myocardial perfusion study with an EF of 26 to 32% and echocardiogram was advised and referred to cardiology.  [facility] 2017 at [facility] his ejection fraction is greater than 55%. Myocardial perfusion study 05/18/2017 with an EF of 44% described as mildly reduced described as appearing better than recorded.  compliance with diet, lifestyle and medications: Yes  His wife is present tells me he had an echocardiogram last week at the [facility] they were called and told that his heart muscle is weak.  Unfortunately she cannot pull the report up for me in the office I cannot see it in care  everywhere she will take a screenshot at home and send it to me as an attachment. He has chronic shortness of breath and she tells me he has severe wheezing. No orthopnea PND chest pain palpitation or syncope.  Left and right heart catheterization 05/08/2019Procedures  RIGHT/LEFT HEART CATH AND CORONARY ANGIOGRAPHY   Conclusion  Coronary artery, aortic, aortic valve, and mitral annular calcification noted on cine fluoroscopy. Right dominant anatomy. 50% mid RCA with 50 to 60% ostial to proximal PDA.  No significant obstructive disease is noted. Left main is widely  patent/calcified.  Less than 20% narrowing distally. LAD reaches the left ventricular apex.  Proximal 40 to 50% narrowing is noted.  Regions of obstruction or eccentric. Ramus intermedius is large and bifurcates.  Contains luminal irregularities but no significant obstruction. The circumflex proper supplies a very small territory.  No significant obstruction and no significant obtuse marginal branches arise. Left ventricular ejection fraction 50%.  Mild elevation in end-diastolic pressure is consistent with chronic diastolic heart failure. Peak to peak gradient across the aortic valve is 11 mmHg.  Minimal valve calcification is seen.  No significant aortic stenosis is felt to be present.   RECOMMENDATIONS:   Discharge after recuperation. Clinical correlation by Thomas [name]. Given diffuse atherosclerosis, risk factor modification including LDL lowering, blood pressure 130/80 mmHg, A1c less than 7, and smoking cessation if appropriate.  Additional records: Impression:  1. Abnormal myocardial perfusion scintigraphy demonstrating a  small size, mild severity, reversible perfusion abnormality in  the apical segments compatible with stress-induced myocardial  ischemia in the LAD distribution. Note that the left ventricle  volume is moderately dilated with severely reduced post-stress  ejection fraction of 26-32% with global severe hypokinesis.  Findings raise the possibility of a cardiomyopathy that may be  nonischemic. Recommend correlation with echocardiogram.   2. Redemonstration of chronic moderate size pericardial effusion,  unchanged compared to CT chest 08/24/2019. Small right greater  than left pleural effusions are increased and may be due to heart  failure.   3. New right lower lobe nodularity measuring up to 8 mm is  indeterminate and may be inflammatory, however malignancy is not  excluded. Recommend follow-up CT chest in 6 weeks to evaluate for  persistence or resolution.    Past Medical History:  Diagnosis Date   Actinic keratosis 05/12/2017   Back pain    Depression 05/12/2017   Diabetes ([place])    GERD (gastroesophageal reflux disease) 05/12/2017   Hx of colonic polyps 05/12/2017   Hyperlipidemia 05/12/2017   Hypertension    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/2017   Low back pain 05/12/2017   Lumbar radiculopathy 05/12/2017   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   Obesity 05/12/2017   Osteoarthritis of knee 05/12/2017   Paresthesia 05/12/2017   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   Prostate nodule 05/12/2017   Right rotator cuff tear 10/01/2021   Seborrheic dermatitis 05/12/2017    Past Surgical History:  Procedure Laterality Date   HAND SURGERY Right    ORIF ANKLE FRACTURE Left 08/15/2007   Distal fibular   REPLACEMENT TOTAL KNEE Right 07/12/2012   RIGHT/LEFT HEART CATH AND CORONARY ANGIOGRAPHY N/A 01/12/2018   Procedure: RIGHT/LEFT HEART CATH AND CORONARY ANGIOGRAPHY;  Surgeon: Belva Crome, MD;  Location: [facility];  Service: Cardiovascular;  Laterality: N/A;   SHOULDER SURGERY Right    UMBILICAL HERNIA REPAIR  06/23/2013    Current Medications: Current Meds  Medication Sig   amLODipine (NORVASC) 10 MG tablet TAKE ONE TABLET BY MOUTH EVERY  DAY FOR BLOOD PRESSURE * NOTE: NEW DOSE *   aspirin 81 MG chewable tablet Chew 81 mg by mouth daily at 12 noon.    atorvastatin (LIPITOR) 80 MG tablet Take 40 mg by mouth at bedtime.    Calcium Carbonate-Vitamin D 600-400 MG-UNIT tablet Take 1 tablet by mouth 2 (two) times daily.   cetirizine (ZYRTEC) 10 MG tablet Take 10 mg by mouth daily as needed for allergies.   diclofenac Sodium (VOLTAREN) 1 % GEL Apply 1 application topically 4 (four) times daily as needed (pain).   DULoxetine (CYMBALTA) 60 MG capsule Take 60 mg by mouth every evening.    empagliflozin (JARDIANCE) 10 MG TABS tablet Take 1 tablet (10 mg total) by mouth daily before breakfast.   EPINEPHrine 0.3 mg/0.3 mL IJ SOAJ injection Inject 0.3 mg into the muscle daily as  needed (for anaphylaxis).    finasteride (PROSCAR) 5 MG tablet Take 5 mg by mouth daily.   glipiZIDE (GLUCOTROL) 10 MG tablet in the morning and at bedtime.   losartan (COZAAR) 100 MG tablet Take 100 mg by mouth daily. For blood pressure and kidney protection   meloxicam (MOBIC) 15 MG tablet Take 7.5 mg by mouth 2 (two) times daily as needed for pain.    metFORMIN (GLUCOPHAGE) 1000 MG tablet Take 1,000 mg by mouth 2 (two) times daily with a meal.    methadone (DOLOPHINE) 5 MG tablet Take 5 mg by mouth every other day. For severe chronic pain   omeprazole (PRILOSEC) 40 MG capsule Take 40 mg by mouth daily.   oxybutynin (DITROPAN) 5 MG tablet Take 5 mg by mouth at bedtime.   oxyCODONE (OXY IR/ROXICODONE) 5 MG immediate release tablet Take 5 mg by mouth 4 (four) times daily as needed (severe chronic pain).    tamsulosin (FLOMAX) 0.4 MG CAPS capsule TAKE ONE CAPSULE BY MOUTH EVERY NIGHT FOR URINATION/PROSTATE * REPLACES TERAZOSIN *   tiZANidine (ZANAFLEX) 4 MG tablet TAKE ONE TABLET BY MOUTH EVERY EVENING AS NEEDED FOR MUSCLE SPASMS     Allergies:   Wasp venom   Social History   Socioeconomic History   Marital status: Married    Spouse name: [name]   Number of children: Not on file   Years of education: Not on file   Highest education level: Not on file  Occupational History   Occupation: retired Nature conservation officer  Tobacco Use   Smoking status: Every Day    Packs/day: 1.00    Years: 55.00    Pack years: 55.00    Types: Cigarettes   Smokeless tobacco: Never   Tobacco comments:    Has cut back  Vaping Use   Vaping Use: Never used  Substance and Sexual Activity   Alcohol use: No   Drug use: No    Comment: takes prescriptions as prescribed: oxycodone and methadone   Sexual activity: Never    Birth control/protection: None  Other Topics Concern   Not on file  Social History Narrative   Lives at home with wife   Right handed   Caffeine: 2-4 cups/day   Social Determinants of Health    Financial Resource Strain: Not on file  Food Insecurity: Not on file  Transportation Needs: Not on file  Physical Activity: Not on file  Stress: Not on file  Social Connections: Not on file     Family History: The patient's family history includes Alzheimer's disease in his father; Heart disease in his paternal aunt; Hypertension in his father and mother. ROS:  Please see the history of present illness.    All other systems reviewed and are negative.  EKGs/Labs/Other Studies Reviewed:    The following studies were reviewed today: 01/12/2018: Coronary Findings  Diagnostic Dominance: Right Left Anterior Descending  Ost LAD lesion is 40% stenosed.  Prox LAD lesion is 40% stenosed.    Ramus Intermedius  Vessel is large.    Left Circumflex    First Obtuse Marginal Branch  Vessel is small in size.    Right Coronary Artery  Prox RCA to Mid RCA lesion is 50% stenosed.    Right Posterior Descending Artery  Ost RPDA lesion is 50% stenosed.    Intervention   No interventions have been documented.   Right Heart  Right Heart Pressures Hemodynamic findings consistent with mild pulmonary hypertension. Elevated LV EDP consistent with volume overload.   Left Heart  Left Ventricle The left ventricular size is normal. The left ventricular systolic function is normal. LV end diastolic pressure is mildly elevated. The left ventricular ejection fraction is 50-55% by visual estimate. No regional wall motion abnormalities.   Coronary Diagrams   Diagnostic Dominance: Right      EKG:  EKG ordered today and personally reviewed.  The ekg ordered today demonstrates sinus rhythm left axis deviation ST-T abnormality ischemia versus repolarization  Recent Labs: Recent labs from care everywhere 09/23/2021 hemoglobin 13.9 platelets 269,000 creatinine 1.5 potassium 4.4 04/16/2021: Hemoglobin 13.9; Platelets 277  Recent Lipid Panel No results found for: CHOL, TRIG, HDL, CHOLHDL,  VLDL, LDLCALC, LDLDIRECT  Physical Exam:    VS:  BP 138/80    Pulse 68    Ht _0  (1.727 m)    Wt 225 lb (102.1 kg)    SpO2 95%    BMI 34.21 kg/m     Wt Readings from Last 3 Encounters:  11/11/21 225 lb (102.1 kg)  10/22/21 226 lb 9.6 oz (102.8 kg)  09/26/21 228 lb 6.4 oz (103.6 kg)     GEN: He looks better than last time I saw him when he had severe unremitting back pain well nourished, well developed in no acute distress HEENT: Normal NECK: No JVD; No carotid bruits LYMPHATICS: No lymphadenopathy CARDIAC: Soft S1 1/6 systolic ejection murmur no S3 RRR, no murmurs, rubs, gallops RESPIRATORY:  Clear to auscultation without rales, wheezing or rhonchi  ABDOMEN: Soft, non-tender, non-distended MUSCULOSKELETAL:  No edema; No deformity  SKIN: Warm and dry NEUROLOGIC:  Alert and oriented x 3 PSYCHIATRIC:  Normal affect    Signed, Thomas More, MD  11/11/2021 10:01 AM    [place]

## 2021-11-10 DIAGNOSIS — Z961 Presence of intraocular lens: Secondary | ICD-10-CM | POA: Diagnosis not present

## 2021-11-10 DIAGNOSIS — H2181 Floppy iris syndrome: Secondary | ICD-10-CM | POA: Diagnosis not present

## 2021-11-10 DIAGNOSIS — E119 Type 2 diabetes mellitus without complications: Secondary | ICD-10-CM | POA: Diagnosis not present

## 2021-11-10 DIAGNOSIS — H2512 Age-related nuclear cataract, left eye: Secondary | ICD-10-CM | POA: Diagnosis not present

## 2021-11-10 DIAGNOSIS — H02831 Dermatochalasis of right upper eyelid: Secondary | ICD-10-CM | POA: Diagnosis not present

## 2021-11-10 DIAGNOSIS — H43811 Vitreous degeneration, right eye: Secondary | ICD-10-CM | POA: Diagnosis not present

## 2021-11-11 ENCOUNTER — Other Ambulatory Visit: Payer: Self-pay

## 2021-11-11 ENCOUNTER — Ambulatory Visit (INDEPENDENT_AMBULATORY_CARE_PROVIDER_SITE_OTHER): Payer: Medicare Other | Admitting: Cardiology

## 2021-11-11 ENCOUNTER — Encounter: Payer: Self-pay | Admitting: Family Medicine

## 2021-11-11 ENCOUNTER — Encounter: Payer: Self-pay | Admitting: Cardiology

## 2021-11-11 VITALS — BP 138/80 | HR 68 | Ht 68.0 in | Wt 225.0 lb

## 2021-11-11 DIAGNOSIS — I25119 Atherosclerotic heart disease of native coronary artery with unspecified angina pectoris: Secondary | ICD-10-CM

## 2021-11-11 DIAGNOSIS — E782 Mixed hyperlipidemia: Secondary | ICD-10-CM | POA: Diagnosis not present

## 2021-11-11 DIAGNOSIS — R262 Difficulty in walking, not elsewhere classified: Secondary | ICD-10-CM | POA: Diagnosis not present

## 2021-11-11 DIAGNOSIS — I519 Heart disease, unspecified: Secondary | ICD-10-CM

## 2021-11-11 DIAGNOSIS — I110 Hypertensive heart disease with heart failure: Secondary | ICD-10-CM

## 2021-11-11 DIAGNOSIS — R296 Repeated falls: Secondary | ICD-10-CM | POA: Diagnosis not present

## 2021-11-11 DIAGNOSIS — I350 Nonrheumatic aortic (valve) stenosis: Secondary | ICD-10-CM

## 2021-11-11 MED ORDER — ENTRESTO 24-26 MG PO TABS: 1.0000 | ORAL_TABLET | Freq: Two times a day (BID) | ORAL | 0 refills | Status: DC

## 2021-11-11 MED ORDER — ENTRESTO 24-26 MG PO TABS: 1.0000 | ORAL_TABLET | Freq: Two times a day (BID) | ORAL | 11 refills | Status: DC

## 2021-11-11 NOTE — Patient Instructions (Signed)
Medication Instructions:  ?Your physician has recommended you make the following change in your medication: Discontinue Losartan ?Start Entresto 24/26 Take 1 tablet two times a day ? ?*If you need a refill on your cardiac medications before your next appointment, please call your pharmacy* ? ? ?Lab Work: ?NONE ?If you have labs (blood work) drawn today and your tests are completely normal, you will receive your results only by: ?MyChart Message (if you have MyChart) OR ?A paper copy in the mail ?If you have any lab test that is abnormal or we need to change your treatment, we will call you to review the results. ? ? ?Testing/Procedures: ?NONE ? ? ?Follow-Up: ?At [facility], you and your health needs are our priority.  As part of our continuing mission to provide you with exceptional heart care, we have created designated Provider Care Teams.  These Care Teams include your primary Cardiologist (physician) and Advanced Practice Providers (APPs -  Physician Assistants and Nurse Practitioners) who all work together to provide you with the care you need, when you need it. ? ?We recommend signing up for the patient portal called "MyChart".  Sign up information is provided on this After Visit Summary.  MyChart is used to connect with patients for Virtual Visits (Telemedicine).  Patients are able to view lab/test results, encounter notes, upcoming appointments, etc.  Non-urgent messages can be sent to your provider as well.   ?To learn more about what you can do with MyChart, go to [email or web address].   ? ?Your next appointment:   ?6 week(s) ? ?The format for your next appointment:   ?In Person ? ?Provider:   ?Shirlee More, MD  ? ? ?Other Instructions ?  ?

## 2021-11-12 ENCOUNTER — Telehealth: Payer: Self-pay | Admitting: Cardiology

## 2021-11-12 NOTE — Telephone Encounter (Signed)
Record send via Epic. ?

## 2021-11-12 NOTE — Telephone Encounter (Signed)
Anne Ng is the Cardiology Web designer for the [facility]. She was requesting a copy of the patients records and OV notes from his appt 11/11/21. Please fax them to her at [phone] ?

## 2021-11-19 DIAGNOSIS — R262 Difficulty in walking, not elsewhere classified: Secondary | ICD-10-CM | POA: Diagnosis not present

## 2021-11-19 DIAGNOSIS — R296 Repeated falls: Secondary | ICD-10-CM | POA: Diagnosis not present

## 2021-11-21 DIAGNOSIS — R262 Difficulty in walking, not elsewhere classified: Secondary | ICD-10-CM | POA: Diagnosis not present

## 2021-11-21 DIAGNOSIS — R296 Repeated falls: Secondary | ICD-10-CM | POA: Diagnosis not present

## 2021-11-24 DIAGNOSIS — R296 Repeated falls: Secondary | ICD-10-CM | POA: Diagnosis not present

## 2021-11-24 DIAGNOSIS — R262 Difficulty in walking, not elsewhere classified: Secondary | ICD-10-CM | POA: Diagnosis not present

## 2021-11-26 ENCOUNTER — Ambulatory Visit (INDEPENDENT_AMBULATORY_CARE_PROVIDER_SITE_OTHER): Payer: Medicare Other

## 2021-11-26 DIAGNOSIS — R296 Repeated falls: Secondary | ICD-10-CM | POA: Diagnosis not present

## 2021-11-26 DIAGNOSIS — Z0000 Encounter for general adult medical examination without abnormal findings: Secondary | ICD-10-CM

## 2021-11-26 DIAGNOSIS — R262 Difficulty in walking, not elsewhere classified: Secondary | ICD-10-CM | POA: Diagnosis not present

## 2021-11-26 NOTE — Progress Notes (Signed)
? ?Subjective:  ? Thomas [name] is a 76 y.o. male who presents for an Initial Medicare Annual Wellness Visit. ? ?I connected with Thomas [name] today by telephone and verified that I am speaking with the correct person using two identifiers. ?Location patient: home ?Location provider: work ?Persons participating in the virtual visit: patient, provider. ?  ?I discussed the limitations, risks, security and privacy concerns of performing an evaluation and management service by telephone and the availability of in person appointments. I also discussed with the patient that there may be a patient responsible charge related to this service. The patient expressed understanding and verbally consented to this telephonic visit.  ?  ?Interactive audio and video telecommunications were attempted between this provider and patient, however failed, due to patient having technical difficulties OR patient did not have access to video capability.  We continued and completed visit with audio only. ? ?  ?Review of Systems    ? ?Cardiac Risk Factors include: advanced age (>41mn, >>9women);diabetes mellitus;male gender;dyslipidemia ? ?   ?Objective:  ?  ?Today's Vitals  ? ?There is no height or weight on file to calculate BMI. ? ? ?  11/26/2021  ?  4:06 PM 08/14/2021  ?  5:22 PM 05/05/2020  ?  9:56 AM 01/01/2020  ?  8:45 PM 12/11/2019  ? 12:43 PM 07/25/2019  ?  8:03 AM 05/21/2019  ?  3:12 PM  ?Advanced Directives  ?Does Patient Have a Medical Advance Directive? Yes Yes No Yes No Yes Yes  ?Type of [place] Living [place];Healthcare Power of Attorney  Living will  [place];Healthcare Power of Attorney  ?Does patient want to make changes to medical advance directive?    No - Guardian declined  No - Patient declined   ?Copy of [place]? No - copy requested No - copy requested    No - copy requested   ?Would patient like  information on creating a medical advance directive?    No - Guardian declined No - Patient declined    ? ? ?Current Medications (verified) ?Outpatient Encounter Medications as of 11/26/2021  ?Medication Sig  ? amLODipine (NORVASC) 10 MG tablet TAKE ONE TABLET BY MOUTH EVERY DAY FOR BLOOD PRESSURE * NOTE: NEW DOSE *  ? aspirin 81 MG chewable tablet Chew 81 mg by mouth daily at 12 noon.   ? atorvastatin (LIPITOR) 80 MG tablet Take 40 mg by mouth at bedtime.   ? Calcium Carbonate-Vitamin D 600-400 MG-UNIT tablet Take 1 tablet by mouth 2 (two) times daily.  ? cetirizine (ZYRTEC) 10 MG tablet Take 10 mg by mouth daily as needed for allergies.  ? diclofenac Sodium (VOLTAREN) 1 % GEL Apply 1 application topically 4 (four) times daily as needed (pain).  ? DULoxetine (CYMBALTA) 60 MG capsule Take 60 mg by mouth every evening.   ? empagliflozin (JARDIANCE) 10 MG TABS tablet Take 1 tablet (10 mg total) by mouth daily before breakfast.  ? EPINEPHrine 0.3 mg/0.3 mL IJ SOAJ injection Inject 0.3 mg into the muscle daily as needed (for anaphylaxis).   ? finasteride (PROSCAR) 5 MG tablet Take 5 mg by mouth daily.  ? glipiZIDE (GLUCOTROL) 10 MG tablet in the morning and at bedtime.  ? meloxicam (MOBIC) 15 MG tablet Take 7.5 mg by mouth 2 (two) times daily as needed for pain.   ? metFORMIN (GLUCOPHAGE) 1000 MG tablet Take 1,000 mg by mouth  2 (two) times daily with a meal.   ? methadone (DOLOPHINE) 5 MG tablet Take 5 mg by mouth every other day. For severe chronic pain  ? omeprazole (PRILOSEC) 40 MG capsule Take 40 mg by mouth daily.  ? oxybutynin (DITROPAN) 5 MG tablet Take 5 mg by mouth at bedtime.  ? oxyCODONE (OXY IR/ROXICODONE) 5 MG immediate release tablet Take 5 mg by mouth 4 (four) times daily as needed (severe chronic pain).   ? sacubitril-valsartan (ENTRESTO) 24-26 MG Take 1 tablet by mouth 2 (two) times daily.  ? sacubitril-valsartan (ENTRESTO) 24-26 MG Take 1 tablet by mouth 2 (two) times daily.  ? tamsulosin (FLOMAX) 0.4  MG CAPS capsule TAKE ONE CAPSULE BY MOUTH EVERY NIGHT FOR URINATION/PROSTATE * REPLACES TERAZOSIN *  ? tiZANidine (ZANAFLEX) 4 MG tablet TAKE ONE TABLET BY MOUTH EVERY EVENING AS NEEDED FOR MUSCLE SPASMS  ? ?No facility-administered encounter medications on file as of 11/26/2021.  ? ? ?Allergies (verified) ?Wasp venom  ? ?History: ?Past Medical History:  ?Diagnosis Date  ? Actinic keratosis 05/12/2017  ? Back pain   ? Depression 05/12/2017  ? Diabetes ([place])   ? GERD (gastroesophageal reflux disease) 05/12/2017  ? Hx of colonic polyps 05/12/2017  ? Hyperlipidemia 05/12/2017  ? Hypertension   ? 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 Low back pain 05/12/2017  ? Lumbar radiculopathy 05/12/2017  ? 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 Obesity 05/12/2017  ? Osteoarthritis of knee 05/12/2017  ? Paresthesia 05/12/2017  ? 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 Prostate nodule 05/12/2017  ? Right rotator cuff tear 10/01/2021  ? Seborrheic dermatitis 05/12/2017  ? ?Past Surgical History:  ?Procedure Laterality Date  ? HAND SURGERY Right   ? ORIF ANKLE FRACTURE Left 08/15/2007  ? Distal fibular  ? 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 RIGHT/LEFT HEART CATH AND CORONARY ANGIOGRAPHY N/A 01/12/2018  ? Procedure: RIGHT/LEFT HEART CATH AND CORONARY ANGIOGRAPHY;  Surgeon: Belva Crome, MD;  Location: [facility];  Service: Cardiovascular;  Laterality: N/A;  ? SHOULDER SURGERY Right   ? UMBILICAL HERNIA REPAIR  06/23/2013  ? ?Family History  ?Problem Relation Age of Onset  ? Hypertension Mother   ? Hypertension Father   ? Alzheimer's disease Father   ? Heart disease Paternal Aunt   ? ?Social History  ? ?Socioeconomic History  ? Marital status: Married  ?  Spouse name: [name]  ? Number of children: Not on file  ? Years of education: Not on file  ? Highest education level: Not on file  ?Occupational History  ? Occupation: retired Nature conservation officer  ?Tobacco Use  ? Smoking status: Every Day  ?  Packs/day: 1.00  ?  Years: 55.00  ?  Pack years: 55.00  ?  Types: Cigarettes   ? Smokeless tobacco: Never  ? Tobacco comments:  ?  Has cut back  ?Vaping Use  ? Vaping Use: Never used  ?Substance and Sexual Activity  ? Alcohol use: No  ? Drug use: No  ?  Comment: takes prescriptions as prescribed: oxycodone and methadone  ? Sexual activity: Never  ?  Birth control/protection: None  ?Other Topics Concern  ? Not on file  ?Social History Narrative  ? Lives at home with wife  ? Right handed  ? Caffeine: 2-4 cups/day  ? ?Social Determinants of Health  ? ?Financial Resource Strain: Low Risk   ? Difficulty of Paying Living Expenses: Not hard at all  ?Food Insecurity: No Food Insecurity  ? Worried About Running  Out of Food in the Last Year: Never true  ? Ran Out of Food in the Last Year: Never true  ?Transportation Needs: No Transportation Needs  ? Lack of Transportation (Medical): No  ? Lack of Transportation (Non-Medical): No  ?Physical Activity: Insufficiently Active  ? Days of Exercise per Week: 3 days  ? Minutes of Exercise per Session: 30 min  ?Stress: No Stress Concern Present  ? Feeling of Stress : Not at all  ?Social Connections: Socially Integrated  ? Frequency of Communication with Friends and Family: Three times a week  ? Frequency of Social Gatherings with Friends and Family: Three times a week  ? Attends Religious Services: More than 4 times per year  ? Active Member of Clubs or Organizations: Yes  ? Attends Archivist Meetings: 1 to 4 times per year  ? Marital Status: Married  ? ? ?Tobacco Counseling ?Ready to quit: Not Answered ?Counseling given: Not Answered ?Tobacco comments: Has cut back ? ? ?Clinical Intake: ? ?Pre-visit preparation completed: Yes ? ?Pain : No/denies pain ? ?  ? ?Nutritional Risks: None ?Diabetes: Yes ?CBG done?: No ?Did pt. bring in CBG monitor from home?: No ? ?How often do you need to have someone help you when you read instructions, pamphlets, or other written materials from your doctor or pharmacy?: 1 - Never ?What is the last grade level you  completed in school?: High School ? ?Diabetic?yes ?Nutrition Risk Assessment: ? ?Has the patient had any N/V/D within the last 2 months?  No  ?Does the patient have any non-healing wounds?  No  ?Has the patient had any

## 2021-11-26 NOTE — Patient Instructions (Signed)
[name] , ?Thank you for taking time to come for your Medicare Wellness Visit. I appreciate your ongoing commitment to your health goals. Please review the following plan we discussed and let me know if I can assist you in the future.  ? ?Screening recommendations/referrals: ?Colonoscopy: no longer required  ?Recommended yearly ophthalmology/optometry visit for glaucoma screening and checkup ?Recommended yearly dental visit for hygiene and checkup ? ?Vaccinations: ?Influenza vaccine: completed  ?Pneumococcal vaccine: completed  ?Tdap vaccine: 12/19/2014 ?Shingles vaccine: completed    ? ?Advanced directives: none  ? ?Conditions/risks identified: none  ? ?Next appointment: none  ? ?Preventive Care 78 Years and Older, Male ?Preventive care refers to lifestyle choices and visits with your health care provider that can promote health and wellness. ?What does preventive care include? ?A yearly physical exam. This is also called an annual well check. ?Dental exams once or twice a year. ?Routine eye exams. Ask your health care provider how often you should have your eyes checked. ?Personal lifestyle choices, including: ?Daily care of your teeth and gums. ?Regular physical activity. ?Eating a healthy diet. ?Avoiding tobacco and drug use. ?Limiting alcohol use. ?Practicing safe sex. ?Taking low doses of aspirin every day. ?Taking vitamin and mineral supplements as recommended by your health care provider. ?What happens during an annual well check? ?The services and screenings done by your health care provider during your annual well check will depend on your age, overall health, lifestyle risk factors, and family history of disease. ?Counseling  ?Your health care provider may ask you questions about your: ?Alcohol use. ?Tobacco use. ?Drug use. ?Emotional well-being. ?Home and relationship well-being. ?Sexual activity. ?Eating habits. ?History of falls. ?Memory and ability to understand (cognition). ?Work and work  Statistician. ?Screening  ?You may have the following tests or measurements: ?Height, weight, and BMI. ?Blood pressure. ?Lipid and cholesterol levels. These may be checked every 5 years, or more frequently if you are over 27 years old. ?Skin check. ?Lung cancer screening. You may have this screening every year starting at age 71 if you have a 30-pack-year history of smoking and currently smoke or have quit within the past 15 years. ?Fecal occult blood test (FOBT) of the stool. You may have this test every year starting at age 37. ?Flexible sigmoidoscopy or colonoscopy. You may have a sigmoidoscopy every 5 years or a colonoscopy every 10 years starting at age 53. ?Prostate cancer screening. Recommendations will vary depending on your family history and other risks. ?Hepatitis C blood test. ?Hepatitis B blood test. ?Sexually transmitted disease (STD) testing. ?Diabetes screening. This is done by checking your blood sugar (glucose) after you have not eaten for a while (fasting). You may have this done every 1-3 years. ?Abdominal aortic aneurysm (AAA) screening. You may need this if you are a current or former smoker. ?Osteoporosis. You may be screened starting at age 77 if you are at high risk. ?Talk with your health care provider about your test results, treatment options, and if necessary, the need for more tests. ?Vaccines  ?Your health care provider may recommend certain vaccines, such as: ?Influenza vaccine. This is recommended every year. ?Tetanus, diphtheria, and acellular pertussis (Tdap, Td) vaccine. You may need a Td booster every 10 years. ?Zoster vaccine. You may need this after age 55. ?Pneumococcal 13-valent conjugate (PCV13) vaccine. One dose is recommended after age 49. ?Pneumococcal polysaccharide (PPSV23) vaccine. One dose is recommended after age 34. ?Talk to your health care provider about which screenings and vaccines you need and how  often you need them. ?This information is not intended to replace  advice given to you by your health care provider. Make sure you discuss any questions you have with your health care provider. ?Document Released: 09/20/2015 Document Revised: 05/13/2016 Document Reviewed: 06/25/2015 ?Elsevier Interactive Patient Education ? 2017 [place]. ? ?Fall Prevention in the Home ?Falls can cause injuries. They can happen to people of all ages. There are many things you can do to make your home safe and to help prevent falls. ?What can I do on the outside of my home? ?Regularly fix the edges of walkways and driveways and fix any cracks. ?Remove anything that might make you trip as you walk through a door, such as a raised step or threshold. ?Trim any bushes or trees on the path to your home. ?Use bright outdoor lighting. ?Clear any walking paths of anything that might make someone trip, such as rocks or tools. ?Regularly check to see if handrails are loose or broken. Make sure that both sides of any steps have handrails. ?Any raised decks and porches should have guardrails on the edges. ?Have any leaves, snow, or ice cleared regularly. ?Use sand or salt on walking paths during winter. ?Clean up any spills in your garage right away. This includes oil or grease spills. ?What can I do in the bathroom? ?Use night lights. ?Install grab bars by the toilet and in the tub and shower. Do not use towel bars as grab bars. ?Use non-skid mats or decals in the tub or shower. ?If you need to sit down in the shower, use a plastic, non-slip stool. ?Keep the floor dry. Clean up any water that spills on the floor as soon as it happens. ?Remove soap buildup in the tub or shower regularly. ?Attach bath mats securely with double-sided non-slip rug tape. ?Do not have throw rugs and other things on the floor that can make you trip. ?What can I do in the bedroom? ?Use night lights. ?Make sure that you have a light by your bed that is easy to reach. ?Do not use any sheets or blankets that are too big for your bed.  They should not hang down onto the floor. ?Have a firm chair that has side arms. You can use this for support while you get dressed. ?Do not have throw rugs and other things on the floor that can make you trip. ?What can I do in the kitchen? ?Clean up any spills right away. ?Avoid walking on wet floors. ?Keep items that you use a lot in easy-to-reach places. ?If you need to reach something above you, use a strong step stool that has a grab bar. ?Keep electrical cords out of the way. ?Do not use floor polish or wax that makes floors slippery. If you must use wax, use non-skid floor wax. ?Do not have throw rugs and other things on the floor that can make you trip. ?What can I do with my stairs? ?Do not leave any items on the stairs. ?Make sure that there are handrails on both sides of the stairs and use them. Fix handrails that are broken or loose. Make sure that handrails are as long as the stairways. ?Check any carpeting to make sure that it is firmly attached to the stairs. Fix any carpet that is loose or worn. ?Avoid having throw rugs at the top or bottom of the stairs. If you do have throw rugs, attach them to the floor with carpet tape. ?Make sure that you have a  light switch at the top of the stairs and the bottom of the stairs. If you do not have them, ask someone to add them for you. ?What else can I do to help prevent falls? ?Wear shoes that: ?Do not have high heels. ?Have rubber bottoms. ?Are comfortable and fit you well. ?Are closed at the toe. Do not wear sandals. ?If you use a stepladder: ?Make sure that it is fully opened. Do not climb a closed stepladder. ?Make sure that both sides of the stepladder are locked into place. ?Ask someone to hold it for you, if possible. ?Clearly mark and make sure that you can see: ?Any grab bars or handrails. ?First and last steps. ?Where the edge of each step is. ?Use tools that help you move around (mobility aids) if they are needed. These  include: ?Canes. ?Walkers. ?Scooters. ?Crutches. ?Turn on the lights when you go into a dark area. Replace any light bulbs as soon as they burn out. ?Set up your furniture so you have a clear path. Avoid moving your furniture around. ?I

## 2021-11-28 DIAGNOSIS — R296 Repeated falls: Secondary | ICD-10-CM | POA: Diagnosis not present

## 2021-11-28 DIAGNOSIS — R262 Difficulty in walking, not elsewhere classified: Secondary | ICD-10-CM | POA: Diagnosis not present

## 2021-12-01 DIAGNOSIS — R296 Repeated falls: Secondary | ICD-10-CM | POA: Diagnosis not present

## 2021-12-01 DIAGNOSIS — R262 Difficulty in walking, not elsewhere classified: Secondary | ICD-10-CM | POA: Diagnosis not present

## 2021-12-03 DIAGNOSIS — M79672 Pain in left foot: Secondary | ICD-10-CM | POA: Diagnosis not present

## 2021-12-03 DIAGNOSIS — M79671 Pain in right foot: Secondary | ICD-10-CM | POA: Diagnosis not present

## 2021-12-03 DIAGNOSIS — R262 Difficulty in walking, not elsewhere classified: Secondary | ICD-10-CM | POA: Diagnosis not present

## 2021-12-03 DIAGNOSIS — B351 Tinea unguium: Secondary | ICD-10-CM | POA: Diagnosis not present

## 2021-12-03 DIAGNOSIS — R296 Repeated falls: Secondary | ICD-10-CM | POA: Diagnosis not present

## 2021-12-03 DIAGNOSIS — E119 Type 2 diabetes mellitus without complications: Secondary | ICD-10-CM | POA: Diagnosis not present

## 2021-12-05 DIAGNOSIS — R262 Difficulty in walking, not elsewhere classified: Secondary | ICD-10-CM | POA: Diagnosis not present

## 2021-12-05 DIAGNOSIS — R296 Repeated falls: Secondary | ICD-10-CM | POA: Diagnosis not present

## 2021-12-10 DIAGNOSIS — R262 Difficulty in walking, not elsewhere classified: Secondary | ICD-10-CM | POA: Diagnosis not present

## 2021-12-10 DIAGNOSIS — R296 Repeated falls: Secondary | ICD-10-CM | POA: Diagnosis not present

## 2021-12-12 DIAGNOSIS — R296 Repeated falls: Secondary | ICD-10-CM | POA: Diagnosis not present

## 2021-12-12 DIAGNOSIS — R262 Difficulty in walking, not elsewhere classified: Secondary | ICD-10-CM | POA: Diagnosis not present

## 2021-12-15 DIAGNOSIS — R296 Repeated falls: Secondary | ICD-10-CM | POA: Diagnosis not present

## 2021-12-15 DIAGNOSIS — R262 Difficulty in walking, not elsewhere classified: Secondary | ICD-10-CM | POA: Diagnosis not present

## 2021-12-17 DIAGNOSIS — R296 Repeated falls: Secondary | ICD-10-CM | POA: Diagnosis not present

## 2021-12-17 DIAGNOSIS — R262 Difficulty in walking, not elsewhere classified: Secondary | ICD-10-CM | POA: Diagnosis not present

## 2021-12-19 DIAGNOSIS — R296 Repeated falls: Secondary | ICD-10-CM | POA: Diagnosis not present

## 2021-12-19 DIAGNOSIS — R262 Difficulty in walking, not elsewhere classified: Secondary | ICD-10-CM | POA: Diagnosis not present

## 2021-12-22 DIAGNOSIS — R296 Repeated falls: Secondary | ICD-10-CM | POA: Diagnosis not present

## 2021-12-22 DIAGNOSIS — R262 Difficulty in walking, not elsewhere classified: Secondary | ICD-10-CM | POA: Diagnosis not present

## 2021-12-23 ENCOUNTER — Encounter: Payer: Self-pay | Admitting: Family Medicine

## 2021-12-24 ENCOUNTER — Ambulatory Visit (INDEPENDENT_AMBULATORY_CARE_PROVIDER_SITE_OTHER): Payer: Medicare Other | Admitting: Cardiology

## 2021-12-24 ENCOUNTER — Encounter: Payer: Self-pay | Admitting: Cardiology

## 2021-12-24 VITALS — BP 162/80 | HR 82 | Ht 68.0 in | Wt 232.0 lb

## 2021-12-24 DIAGNOSIS — I25119 Atherosclerotic heart disease of native coronary artery with unspecified angina pectoris: Secondary | ICD-10-CM | POA: Diagnosis not present

## 2021-12-24 DIAGNOSIS — I3139 Other pericardial effusion (noninflammatory): Secondary | ICD-10-CM | POA: Diagnosis not present

## 2021-12-24 DIAGNOSIS — E782 Mixed hyperlipidemia: Secondary | ICD-10-CM | POA: Diagnosis not present

## 2021-12-24 DIAGNOSIS — R296 Repeated falls: Secondary | ICD-10-CM | POA: Diagnosis not present

## 2021-12-24 DIAGNOSIS — R262 Difficulty in walking, not elsewhere classified: Secondary | ICD-10-CM | POA: Diagnosis not present

## 2021-12-24 DIAGNOSIS — I110 Hypertensive heart disease with heart failure: Secondary | ICD-10-CM

## 2021-12-24 DIAGNOSIS — I519 Heart disease, unspecified: Secondary | ICD-10-CM

## 2021-12-24 DIAGNOSIS — I350 Nonrheumatic aortic (valve) stenosis: Secondary | ICD-10-CM

## 2021-12-24 MED ORDER — ENTRESTO 49-51 MG PO TABS: 1.0000 | ORAL_TABLET | Freq: Two times a day (BID) | ORAL | 3 refills | Status: DC

## 2021-12-24 MED ORDER — FUROSEMIDE 40 MG PO TABS: 40.0000 mg | ORAL_TABLET | Freq: Every day | ORAL | 3 refills | Status: DC

## 2021-12-24 NOTE — Progress Notes (Signed)
?Cardiology Office Note:   ? ?Date:  12/24/2021  ? ?ID:  Thomas [name], DOB 05/25/1946, MRN [id number] ? ?PCP:  Thomas Salter, MD  ?Cardiologist:  Thomas More, MD   ? ?Referring MD: Thomas Salter, MD  ? ? ?ASSESSMENT:   ? ?1. LV dysfunction   ?2. Hypertensive heart disease with heart failure ([place])   ?3. Pericardial effusion   ?4. Coronary artery disease involving native coronary artery of native heart with angina pectoris ([place])   ?5. Aortic stenosis, mild   ?6. Mixed hyperlipidemia   ? ?PLAN:   ? ?In order of problems listed above: ? ?This is a very complex case of combined heart failure heart disease LV dysfunction pericardial effusion and severe COPD.  He tolerates his Entresto we will uptitrate.  I am surprised he is not on loop diuretic with bilateral pleural effusions marked edema but was started on furosemide 40 mg daily.  He does have a lot of urinary frequency and compliance may be the issue.  Fortunately his wife is with him to restrict sodium in the diet he is unable to be weighed every day.  Although she notices wheezing and sore severely he is not complaining of shortness of breath.  Continue his other treatment including SGLT2 inhibitor and in his case I would avoid beta-blockers with the severity of his COPD we will recheck his renal function in 2 weeks and consider adding MRA.  Blood pressure remains elevated and will continue to uptitrate his Entresto and a possible stop calcium channel blocker which can complicate the issue of edema ?He has a chronic pericardial effusion no markers here for use of constrictive physiology is actually hypertensive he does need to get an echocardiogram done to quantitate confirm and also guide [place] for pulmonary hypertension LV filling pressures.  We will order an office ?Stable CAD having no anginal discomfort continue medical treatment including aspirin and statin ?Recheck echocardiogram regarding aortic stenosis ?Continue lipid-lowering treatment ? ? ? ? ? ? ?Next  appointment: 3 months ? ? ?Medication Adjustments/Labs and Tests Ordered: ?Current medicines are reviewed at length with the patient today.  Concerns regarding medicines are outlined above.  ?No orders of the defined types were placed in this encounter. ? ?No orders of the defined types were placed in this encounter. ? ? ?Follow-up for heart failure ? ?History of Present Illness:   ? ?Thomas [name] is a 76 y.o. male with a hx of  heart failure hypertensive heart disease aortic stenosis last seen by me 01/07/2018.  Since then he is followed with Dr. Agustin [name].  He underwent right and left heart catheterization 01/12/2018 which showed mild CAD most severe proximal right coronary artery 50% his hemodynamics showed elevated pulmonary artery pressure 36/19 mean 25 pulmonary capillary wedge pressure modestly elevated at 17 and a peak to peak gradient across the aortic valve 11 mmHg.  Had normal left ventricular ejection fraction 50 to 55%. He was last seen 11/11/2021 for heart failure and initiated on Entresto. ? ?I was sent 185 pages of records from the [facility] system dating back to 2008 and I do not see any cardiology records or diagnostic testing to review.  [facility] is [facility] that he had a myocardial perfusion study with an EF of 26 to 32% and echocardiogram was advised and referred to cardiology.  In 2017 at the [facility] his ejection fraction is greater than 55%. ? ?Myocardial perfusion study 05/18/2017  with an EF of 44% described as mildly reduced described as appearing better than recorded. ? ?Compliance with diet, lifestyle and medications: Yes ? ?After reviewing records do not think he is ever had a cardiac echo done in follow-up to the CT scan ?He needs it done to quantitate his pericardial effusion and exclude effusive constrictive physiology ?His wife prefers to do her office ?He tolerates Entresto without hypertension renal function was normal ?He is no longer on a  loop diuretic ?As always he is wheezing does not seem to be short of breath but edematous no orthopnea PND palpitation syncope or chest pain ? ?Recent labs [facility] 14 2023: ?Creatinine 1.3 sodium 4.2 potassium 142 GFR 57 cc hemoglobin A1c 6.9% ? ?CT of the chest performed without contrast 12/08/2021 ?The heart is described as enlarged stable moderate to large pericardial effusion coronary calcification aortic and mitral calcification and dilated pulmonary artery up to 42 mm ? ? ?Past Medical History:  ?Diagnosis Date  ? Actinic keratosis 05/12/2017  ? Back pain   ? Depression 05/12/2017  ? Diabetes ([place])   ? GERD (gastroesophageal reflux disease) 05/12/2017  ? Hx of colonic polyps 05/12/2017  ? Hyperlipidemia 05/12/2017  ? Hypertension   ? 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 Low back pain 05/12/2017  ? Lumbar radiculopathy 05/12/2017  ? 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 Obesity 05/12/2017  ? Osteoarthritis of knee 05/12/2017  ? Paresthesia 05/12/2017  ? 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 Prostate nodule 05/12/2017  ? Right rotator cuff tear 10/01/2021  ? Seborrheic dermatitis 05/12/2017  ? ? ?Past Surgical History:  ?Procedure Laterality Date  ? HAND SURGERY Right   ? ORIF ANKLE FRACTURE Left 08/15/2007  ? Distal fibular  ? 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 RIGHT/LEFT HEART CATH AND CORONARY ANGIOGRAPHY N/A 01/12/2018  ? Procedure: RIGHT/LEFT HEART CATH AND CORONARY ANGIOGRAPHY;  Surgeon: Belva Crome, MD;  Location: [place] [facility];  Service: Cardiovascular;  Laterality: N/A;  ? SHOULDER SURGERY Right   ? UMBILICAL HERNIA REPAIR  06/23/2013  ? ? ?Current Medications: ?Current Meds  ?Medication Sig  ? amLODipine (NORVASC) 10 MG tablet TAKE ONE TABLET BY MOUTH EVERY DAY FOR BLOOD PRESSURE * NOTE: NEW DOSE *  ? aspirin 81 MG chewable tablet Chew 81 mg by mouth daily at 12 noon.   ? atorvastatin (LIPITOR) 80 MG tablet Take 40 mg by mouth at bedtime.   ? Calcium Carbonate-Vitamin D 600-400 MG-UNIT tablet Take 1 tablet by mouth 2  (two) times daily.  ? cetirizine (ZYRTEC) 10 MG tablet Take 10 mg by mouth daily as needed for allergies.  ? diclofenac Sodium (VOLTAREN) 1 % GEL Apply 1 application topically 4 (four) times daily as needed (pain).  ? DULoxetine (CYMBALTA) 60 MG capsule Take 60 mg by mouth every evening.   ? empagliflozin (JARDIANCE) 10 MG TABS tablet Take 1 tablet (10 mg total) by mouth daily before breakfast.  ? EPINEPHrine 0.3 mg/0.3 mL IJ SOAJ injection Inject 0.3 mg into the muscle daily as needed (for anaphylaxis).   ? finasteride (PROSCAR) 5 MG tablet Take 5 mg by mouth daily.  ? glipiZIDE (GLUCOTROL) 10 MG tablet in the morning and at bedtime.  ? meloxicam (MOBIC) 15 MG tablet Take 7.5 mg by mouth 2 (two) times daily as needed for pain.   ? metFORMIN (GLUCOPHAGE) 1000 MG tablet Take 1,000 mg by mouth 2 (two) times daily with a meal.   ? methadone (DOLOPHINE) 5 MG tablet Take 5  mg by mouth every other day. For severe chronic pain  ? omeprazole (PRILOSEC) 40 MG capsule Take 40 mg by mouth daily.  ? oxybutynin (DITROPAN) 5 MG tablet Take 5 mg by mouth at bedtime.  ? oxyCODONE (OXY IR/ROXICODONE) 5 MG immediate release tablet Take 5 mg by mouth 4 (four) times daily as needed (severe chronic pain).   ? sacubitril-valsartan (ENTRESTO) 24-26 MG Take 1 tablet by mouth 2 (two) times daily.  ? sacubitril-valsartan (ENTRESTO) 24-26 MG Take 1 tablet by mouth 2 (two) times daily.  ? tamsulosin (FLOMAX) 0.4 MG CAPS capsule TAKE ONE CAPSULE BY MOUTH EVERY NIGHT FOR URINATION/PROSTATE * REPLACES TERAZOSIN *  ? tiZANidine (ZANAFLEX) 4 MG tablet TAKE ONE TABLET BY MOUTH EVERY EVENING AS NEEDED FOR MUSCLE SPASMS  ?  ? ?Allergies:   Wasp venom  ? ?Social History  ? ?Socioeconomic History  ? Marital status: Married  ?  Spouse name: [name]  ? Number of children: Not on file  ? Years of education: Not on file  ? Highest education level: Not on file  ?Occupational History  ? Occupation: retired Nature conservation officer  ?Tobacco Use  ? Smoking status: Every Day  ?   Packs/day: 1.00  ?  Years: 55.00  ?  Pack years: 55.00  ?  Types: Cigarettes  ?  Passive exposure: Current  ? Smokeless tobacco: Never  ? Tobacco comments:  ?  Has cut back  ?Vaping Use  ? Vaping Use:

## 2021-12-24 NOTE — Patient Instructions (Signed)
Medication Instructions:  ?Your physician has recommended you make the following change in your medication:  ? ?START: Furosemide 40 mg daily ?START: Entresto 49-51 twice daily ? ?*If you need a refill on your cardiac medications before your next appointment, please call your pharmacy* ? ? ?Lab Work: ?Your physician recommends that you return for lab work in:  ? ?Labs in 2 weeks BMP, Pro BNP ? ?If you have labs (blood work) drawn today and your tests are completely normal, you will receive your results only by: ?MyChart Message (if you have MyChart) OR ?A paper copy in the mail ?If you have any lab test that is abnormal or we need to change your treatment, we will call you to review the results. ? ? ?Testing/Procedures: ?Your physician has requested that you have an echocardiogram. Echocardiography is a painless test that uses sound waves to create images of your heart. It provides your doctor with information about the size and shape of your heart and how well your heart?s chambers and valves are working. This procedure takes approximately one hour. There are no restrictions for this procedure. ? ? ? ?Follow-Up: ?At [facility], you and your health needs are our priority.  As part of our continuing mission to provide you with exceptional heart care, we have created designated Provider Care Teams.  These Care Teams include your primary Cardiologist (physician) and Advanced Practice Providers (APPs -  Physician Assistants and Nurse Practitioners) who all work together to provide you with the care you need, when you need it. ? ?We recommend signing up for the patient portal called "MyChart".  Sign up information is provided on this After Visit Summary.  MyChart is used to connect with patients for Virtual Visits (Telemedicine).  Patients are able to view lab/test results, encounter notes, upcoming appointments, etc.  Non-urgent messages can be sent to your provider as well.   ?To learn more about what you can do  with MyChart, go to [email or web address].   ? ?Your next appointment:   ?3 month(s) ? ?The format for your next appointment:   ?In Person ? ?Provider:   ?Shirlee More, MD  ? ? ?Other Instructions ?None ? ?Important Information About Sugar ? ? ? ? ? ? ?

## 2021-12-26 DIAGNOSIS — R262 Difficulty in walking, not elsewhere classified: Secondary | ICD-10-CM | POA: Diagnosis not present

## 2021-12-26 DIAGNOSIS — R296 Repeated falls: Secondary | ICD-10-CM | POA: Diagnosis not present

## 2021-12-29 DIAGNOSIS — R296 Repeated falls: Secondary | ICD-10-CM | POA: Diagnosis not present

## 2021-12-29 DIAGNOSIS — R262 Difficulty in walking, not elsewhere classified: Secondary | ICD-10-CM | POA: Diagnosis not present

## 2021-12-31 DIAGNOSIS — R262 Difficulty in walking, not elsewhere classified: Secondary | ICD-10-CM | POA: Diagnosis not present

## 2021-12-31 DIAGNOSIS — R296 Repeated falls: Secondary | ICD-10-CM | POA: Diagnosis not present

## 2022-01-02 DIAGNOSIS — R262 Difficulty in walking, not elsewhere classified: Secondary | ICD-10-CM | POA: Diagnosis not present

## 2022-01-02 DIAGNOSIS — R296 Repeated falls: Secondary | ICD-10-CM | POA: Diagnosis not present

## 2022-01-05 DIAGNOSIS — R262 Difficulty in walking, not elsewhere classified: Secondary | ICD-10-CM | POA: Diagnosis not present

## 2022-01-05 DIAGNOSIS — R296 Repeated falls: Secondary | ICD-10-CM | POA: Diagnosis not present

## 2022-01-06 ENCOUNTER — Ambulatory Visit (INDEPENDENT_AMBULATORY_CARE_PROVIDER_SITE_OTHER): Payer: Medicare Other

## 2022-01-06 DIAGNOSIS — I3139 Other pericardial effusion (noninflammatory): Secondary | ICD-10-CM

## 2022-01-06 DIAGNOSIS — I350 Nonrheumatic aortic (valve) stenosis: Secondary | ICD-10-CM

## 2022-01-06 DIAGNOSIS — I110 Hypertensive heart disease with heart failure: Secondary | ICD-10-CM

## 2022-01-06 DIAGNOSIS — E782 Mixed hyperlipidemia: Secondary | ICD-10-CM

## 2022-01-06 DIAGNOSIS — I519 Heart disease, unspecified: Secondary | ICD-10-CM

## 2022-01-06 DIAGNOSIS — I25119 Atherosclerotic heart disease of native coronary artery with unspecified angina pectoris: Secondary | ICD-10-CM | POA: Diagnosis not present

## 2022-01-06 LAB — ECHOCARDIOGRAM COMPLETE
AR max vel: 1.78 cm2
AV Area VTI: 1.74 cm2
AV Area mean vel: 1.66 cm2
AV Mean grad: 14 mmHg
AV Peak grad: 22.8 mmHg
Ao pk vel: 2.39 m/s
Area-P 1/2: 4.06 cm2
S' Lateral: 4.5 cm

## 2022-01-07 DIAGNOSIS — R296 Repeated falls: Secondary | ICD-10-CM | POA: Diagnosis not present

## 2022-01-07 DIAGNOSIS — R262 Difficulty in walking, not elsewhere classified: Secondary | ICD-10-CM | POA: Diagnosis not present

## 2022-01-08 ENCOUNTER — Other Ambulatory Visit: Payer: Self-pay

## 2022-01-08 ENCOUNTER — Telehealth: Payer: Self-pay | Admitting: Cardiology

## 2022-01-08 DIAGNOSIS — R079 Chest pain, unspecified: Secondary | ICD-10-CM

## 2022-01-08 NOTE — Telephone Encounter (Signed)
Wife was returning call. Please advise ?

## 2022-01-09 DIAGNOSIS — R262 Difficulty in walking, not elsewhere classified: Secondary | ICD-10-CM | POA: Diagnosis not present

## 2022-01-09 DIAGNOSIS — R296 Repeated falls: Secondary | ICD-10-CM | POA: Diagnosis not present

## 2022-01-09 NOTE — Telephone Encounter (Signed)
Attempted to call patient. Patient did not answer and no voice mail. ?

## 2022-01-12 DIAGNOSIS — R296 Repeated falls: Secondary | ICD-10-CM | POA: Diagnosis not present

## 2022-01-12 DIAGNOSIS — R262 Difficulty in walking, not elsewhere classified: Secondary | ICD-10-CM | POA: Diagnosis not present

## 2022-01-13 NOTE — Telephone Encounter (Signed)
Returned call to pt's wife who wanted clarification the date that pt's cardiac MRI was scheduled. 02/05/22 _0 -she knows to arrive by 1:30. ?

## 2022-01-14 DIAGNOSIS — R296 Repeated falls: Secondary | ICD-10-CM | POA: Diagnosis not present

## 2022-01-14 DIAGNOSIS — R262 Difficulty in walking, not elsewhere classified: Secondary | ICD-10-CM | POA: Diagnosis not present

## 2022-01-16 DIAGNOSIS — R262 Difficulty in walking, not elsewhere classified: Secondary | ICD-10-CM | POA: Diagnosis not present

## 2022-01-16 DIAGNOSIS — R296 Repeated falls: Secondary | ICD-10-CM | POA: Diagnosis not present

## 2022-01-20 ENCOUNTER — Ambulatory Visit (INDEPENDENT_AMBULATORY_CARE_PROVIDER_SITE_OTHER): Payer: Medicare Other | Admitting: Family Medicine

## 2022-01-20 ENCOUNTER — Encounter: Payer: Self-pay | Admitting: Family Medicine

## 2022-01-20 VITALS — BP 156/78 | HR 74 | Temp 97.4°F | Wt 227.6 lb

## 2022-01-20 DIAGNOSIS — M1712 Unilateral primary osteoarthritis, left knee: Secondary | ICD-10-CM | POA: Diagnosis not present

## 2022-01-20 DIAGNOSIS — I25119 Atherosclerotic heart disease of native coronary artery with unspecified angina pectoris: Secondary | ICD-10-CM | POA: Diagnosis not present

## 2022-01-20 DIAGNOSIS — I10 Essential (primary) hypertension: Secondary | ICD-10-CM

## 2022-01-20 DIAGNOSIS — I5032 Chronic diastolic (congestive) heart failure: Secondary | ICD-10-CM | POA: Diagnosis not present

## 2022-01-20 DIAGNOSIS — E1140 Type 2 diabetes mellitus with diabetic neuropathy, unspecified: Secondary | ICD-10-CM

## 2022-01-20 LAB — BASIC METABOLIC PANEL
BUN: 26 mg/dL — ABNORMAL HIGH (ref 6–23)
CO2: 31 mEq/L (ref 19–32)
Calcium: 9.2 mg/dL (ref 8.4–10.5)
Chloride: 103 mEq/L (ref 96–112)
Creatinine, Ser: 1.39 mg/dL (ref 0.40–1.50)
GFR: 48.92 mL/min — ABNORMAL LOW (ref >60.00)
Glucose, Bld: 168 mg/dL — ABNORMAL HIGH (ref 70–99)
Potassium: 4.4 mEq/L (ref 3.5–5.1)
Sodium: 141 mEq/L (ref 135–145)

## 2022-01-20 LAB — GLUCOSE, POCT (MANUAL RESULT ENTRY): POC Glucose: 149 mg/dl — AB (ref 70–99)

## 2022-01-20 MED ORDER — SPIRONOLACTONE 25 MG PO TABS: 25.0000 mg | ORAL_TABLET | Freq: Every day | ORAL | 3 refills | Status: DC

## 2022-01-20 NOTE — Addendum Note (Signed)
Addended by: Haydee Salter on: 01/20/2022 05:42 PM ? ? Modules accepted: Orders ? ?

## 2022-01-20 NOTE — Progress Notes (Addendum)
?[facility] ?[facility] ?[address] ?[place] [address] ?Dept: [phone] ?Dept Fax: [phone] ? ?Chronic Care Office Visit ? ?Subjective:  ? ? Patient ID: Thomas [name], male    DOB: Jan 31, 1946, 76 y.o..   MRN: [id number] ? ?Chief Complaint  ?Patient presents with  ? Follow-up  ?  3 month f/u.    ? ? ?History of Present Illness: ? ?Patient is in today for reassessment of chronic medical issues. ? ?Thomas [name] has a history of hypertension, CAD, mild aortic stenosis, and hypertensive heart disease with chronic diastolic heart failure. He was last seen by Dr. Bettina Gavia (cardiology) in April. He ahs evidence of being fluid overloaded and having a pericardial effusion and pleural effusions. He is now managed on Entresto, amlodipine, a daily aspirin, and Lasix. He also has hyperlipidemia and is on atorvastatin.  Thomas [name] complains of some recent left-sided chest pains. He notes these feel like a "wire" in his left chest. He has some DOE, which resolves with rest. ?  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 He also has knee arthritis. He had a prior right total knee joint replacement, with some residual issues with his knee. After the visit concluded today, he asked about having a knee joint injection. ?  ?Thomas [name] has a history of Type 2 diabetes. His last A1c was 8.9% in January. He is managed on metformin and glipizide. At his last visit in Feb. I added empagliflozin (Jardiance) for both diabetes management and in consideration of his HFrEF. He brought with him a lab result that showed his A1c on 4/18 was now 6.9%. ?  ?Thomas [name] was noted previously to have a pulmonary nodule measuring 8 mm. He had a follow-up CT scan on 12/08/2021 that reportedly showed reduction in the pulmonary nodularity. ? ?Past Medical History: ?Patient Active Problem List  ? Diagnosis Date Noted  ? Pericardial effusion 12/24/2021  ? Pleural effusion  11/04/2021  ? Pulmonary edema 11/04/2021  ? Incidental pulmonary nodule, > 60m and < 880m02/28/2023  ? Spinal stenosis at L4-L5 level 10/01/2021  ? Peripheral arterial disease (HCVaughn01/25/2023  ? Type 2 diabetes mellitus with diabetic neuropathy, unspecified (HCSt. Clair01/20/2023  ? Sensorineural hearing loss, bilateral 09/26/2021  ? Personal history of methicillin resistant Staphylococcus aureus 09/26/2021  ? Degeneration of lumbar or lumbosacral intervertebral disc 09/26/2021  ? Tobacco dependence 09/26/2021  ? Excessive daytime sleepiness 11/13/2020  ? Narcotic habituation, continuous ([facility]CFair[facility]03/05/2021  ? At risk for central sleep apnea 11/13/2020  ? History of claustrophobia 11/13/2020  ? Obesity (BMI 30.0-34.9) 11/13/2020  ? Nocturia 11/13/2020  ? PTSD (post-traumatic stress disorder) 11/13/2020  ? Cognitive and behavioral changes 12/28/2019  ? Essential hypertension   ? Acute metabolic encephalopathy   ? Coronary artery disease 02/11/2018  ? Claudication in peripheral vascular disease (HCMerrimac06/03/2018  ? Atypical chest pain 01/12/2018  ? Coronary artery calcification 01/07/2018  ? CKD (chronic kidney disease) stage 3, GFR 30-59 ml/min (HCC) 11/25/2017  ? Chronic diastolic heart failure (HCElkport09/02/2017  ? Aortic stenosis, mild 05/13/2017  ? Low back pain 05/12/2017  ? GERD (gastroesophageal reflux disease) 05/12/2017  ? Prostate nodule 05/12/2017  ? Depression 05/12/2017  ? Hx of colonic polyps 05/12/2017  ? 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 Hyperlipidemia 05/12/2017  ? Hypertensive heart disease with heart failure (HCCastroville09/01/2017  ? Osteoarthritis of knee 05/12/2017  ? Sleep apnea 05/12/2017  ?  Actinic keratosis 05/12/2017  ? 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 Seborrheic dermatitis 05/12/2017  ? Paresthesia 05/12/2017  ? ?Past Surgical History:  ?Procedure Laterality Date  ? HAND SURGERY Right   ? ORIF ANKLE FRACTURE Left 08/15/2007  ? Distal fibular  ? REPLACEMENT TOTAL KNEE  Right 07/12/2012  ? RIGHT/LEFT HEART CATH AND CORONARY ANGIOGRAPHY N/A 01/12/2018  ? Procedure: RIGHT/LEFT HEART CATH AND CORONARY ANGIOGRAPHY;  Surgeon: Belva Crome, MD;  Location: [facility] [place] [facility];  Service: Cardiovascular;  Laterality: N/A;  ? SHOULDER SURGERY Right   ? UMBILICAL HERNIA REPAIR  06/23/2013  ? ?Family History  ?Problem Relation Age of Onset  ? Hypertension Mother   ? Hypertension Father   ? Alzheimer's disease Father   ? Heart disease Paternal Aunt   ? ?Outpatient Medications Prior to Visit  ?Medication Sig Dispense Refill  ? amLODipine (NORVASC) 10 MG tablet TAKE ONE TABLET BY MOUTH EVERY DAY FOR BLOOD PRESSURE * NOTE: NEW DOSE *    ? aspirin 81 MG chewable tablet Chew 81 mg by mouth daily at 12 noon.     ? atorvastatin (LIPITOR) 80 MG tablet Take 40 mg by mouth at bedtime.     ? Calcium Carbonate-Vitamin D 600-400 MG-UNIT tablet Take 1 tablet by mouth 2 (two) times daily.    ? cetirizine (ZYRTEC) 10 MG tablet Take 10 mg by mouth daily as needed for allergies.    ? diclofenac Sodium (VOLTAREN) 1 % GEL Apply 1 application topically 4 (four) times daily as needed (pain).    ? DULoxetine (CYMBALTA) 60 MG capsule Take 60 mg by mouth every evening.     ? empagliflozin (JARDIANCE) 10 MG TABS tablet Take 1 tablet (10 mg total) by mouth daily before breakfast. 30 tablet 11  ? EPINEPHrine 0.3 mg/0.3 mL IJ SOAJ injection Inject 0.3 mg into the muscle daily as needed (for anaphylaxis).     ? finasteride (PROSCAR) 5 MG tablet Take 5 mg by mouth daily.    ? furosemide (LASIX) 40 MG tablet Take 1 tablet (40 mg total) by mouth daily. 90 tablet 3  ? glipiZIDE (GLUCOTROL) 10 MG tablet in the morning and at bedtime.    ? meloxicam (MOBIC) 15 MG tablet Take 7.5 mg by mouth 2 (two) times daily as needed for pain.     ? metFORMIN (GLUCOPHAGE) 1000 MG tablet Take 1,000 mg by mouth 2 (two) times daily with a meal.     ? methadone (DOLOPHINE) 5 MG tablet Take 5 mg by mouth every other day. For severe chronic pain     ? omeprazole (PRILOSEC) 40 MG capsule Take 40 mg by mouth daily.    ? oxybutynin (DITROPAN) 5 MG tablet Take 5 mg by mouth at bedtime.    ? oxyCODONE (OXY IR/ROXICODONE) 5 MG immediate release tablet Take 5 mg by mouth 4 (four) times daily as needed (severe chronic pain).     ? sacubitril-valsartan (ENTRESTO) 49-51 MG Take 1 tablet by mouth 2 (two) times daily. 180 tablet 3  ? tamsulosin (FLOMAX) 0.4 MG CAPS capsule TAKE ONE CAPSULE BY MOUTH EVERY NIGHT FOR URINATION/PROSTATE * REPLACES TERAZOSIN *    ? tiZANidine (ZANAFLEX) 4 MG tablet TAKE ONE TABLET BY MOUTH EVERY EVENING AS NEEDED FOR MUSCLE SPASMS    ? ?No facility-administered medications prior to visit.  ? ?Allergies  ?Allergen Reactions  ? Wasp Venom Anaphylaxis, Itching and Swelling  ?  ?Objective:  ? ?Today's Vitals  ?  01/20/22 1723  ?BP: (!) 156/78  ?Pulse: 74  ?Temp: (!) 97.4 ?F (36.3 ?C)  ?SpO2: 96%  ?Weight: 227 lb 9.6 oz (103.2 kg)  ? ?Body mass index is 34.61 kg/m?.  ? ?General: Well developed, well nourished. No acute distress. ?Lungs: Bibasilar coarse wheezes noted. No rales or rhonchi. ?CV: RRR with a low grade holosystolic murmur. Pulses 2+ bilaterally. ?Extremities: Trace to 1+ edema of lower legs. ?Psych: Alert and oriented. Normal mood and affect. ? ?Health Maintenance Due  ?Topic Date Due  ? FOOT EXAM  Never done  ? Hepatitis C Screening  Never done  ? COVID-19 Vaccine (5 - Booster for [place] series) 06/24/2021  ?   ?Lab Results: ?POCT Glucose= 149 ? ?Imaging: ?Echocardiogram (01/06/2022) ?IMPRESSIONS  ? 1. Left ventricular ejection fraction, by estimation, is 50 to 55%. The left ventricle has low normal function. The left ventricle has no regional wall motion abnormalities. There is moderate left ventricular hypertrophy. Left ventricular diastolic parameters are consistent with Grade II diastolic dysfunction (pseudonormalization).  ? 2. Right ventricular systolic function is normal. The right ventricular size is normal. There is normal  pulmonary artery systolic pressure.  ? 3. Left atrial size was moderately dilated.  ? 4. Moderate pericardial effusion.  ? 5. The mitral valve is normal in structure. Mild mitral valve regurgitation. No evi

## 2022-01-21 ENCOUNTER — Encounter: Payer: Self-pay | Admitting: Family Medicine

## 2022-01-21 DIAGNOSIS — I10 Essential (primary) hypertension: Secondary | ICD-10-CM

## 2022-01-21 DIAGNOSIS — I5032 Chronic diastolic (congestive) heart failure: Secondary | ICD-10-CM

## 2022-01-21 MED ORDER — SPIRONOLACTONE 25 MG PO TABS: 25.0000 mg | ORAL_TABLET | Freq: Every day | ORAL | 3 refills | Status: DC

## 2022-01-28 DIAGNOSIS — R350 Frequency of micturition: Secondary | ICD-10-CM | POA: Diagnosis not present

## 2022-01-28 DIAGNOSIS — R3912 Poor urinary stream: Secondary | ICD-10-CM | POA: Diagnosis not present

## 2022-01-28 DIAGNOSIS — R351 Nocturia: Secondary | ICD-10-CM | POA: Diagnosis not present

## 2022-01-28 DIAGNOSIS — N401 Enlarged prostate with lower urinary tract symptoms: Secondary | ICD-10-CM | POA: Diagnosis not present

## 2022-02-04 ENCOUNTER — Telehealth (HOSPITAL_COMMUNITY): Payer: Self-pay | Admitting: Emergency Medicine

## 2022-02-04 NOTE — Telephone Encounter (Signed)
Reaching out to patient to offer assistance regarding upcoming cardiac imaging study; pt verbalizes understanding of appt date/time, parking situation and where to check in, pre-test NPO status and medications ordered, and verified current allergies; name and call back number provided for further questions should they arise Marchia Bond RN Navigator Cardiac Imaging [facility] [phone] office [phone] cell  Denies metal other than ortho procedure on ankle Denies iv issues Denies claustro Arrival 130

## 2022-02-05 ENCOUNTER — Ambulatory Visit (HOSPITAL_COMMUNITY)
Admission: RE | Admit: 2022-02-05 | Discharge: 2022-02-05 | Disposition: A | Discharge status: Home / Self Care | Payer: No Typology Code available for payment source | Source: Ambulatory Visit | Attending: Cardiology | Admitting: Cardiology

## 2022-02-05 DIAGNOSIS — H11432 Conjunctival hyperemia, left eye: Secondary | ICD-10-CM | POA: Diagnosis not present

## 2022-02-05 DIAGNOSIS — R079 Chest pain, unspecified: Secondary | ICD-10-CM | POA: Diagnosis not present

## 2022-02-05 MED ORDER — GADOBUTROL 1 MMOL/ML IV SOLN
10.0000 mL | Freq: Once | INTRAVENOUS | Status: AC | PRN
  Administered 2022-02-05: 10 mL via INTRAVENOUS

## 2022-02-10 DIAGNOSIS — H11432 Conjunctival hyperemia, left eye: Secondary | ICD-10-CM | POA: Diagnosis not present

## 2022-02-13 ENCOUNTER — Telehealth: Payer: Self-pay | Admitting: Cardiology

## 2022-02-13 NOTE — Telephone Encounter (Signed)
Pt c/o of Chest Pain: STAT if CP now or developed within 24 hours  1. Are you having CP right now?  No   2. Are you experiencing any other symptoms (ex. SOB, nausea, vomiting, sweating)?  No   3. How long have you been experiencing CP?  Past couple of days, per wife  4. Is your CP continuous or coming and going?  Coming and going, on left side   5. Have you taken Nitroglycerin?   Patient's wife states he took nitro last night, but it did not really help. Patient's wife assumes CP is not severe and patient is just more worried than anything. She states he has been concerned about MRI results.

## 2022-02-13 NOTE — Telephone Encounter (Signed)
Called patient and he reported that he had been having chest pain for the past couple of days. The pain is coming and going on the left side and is now on the right side as well. Patient states that it "feels like someone has kicked him in the chest". He also reports having to take nitro the past couple of nights which does relieve the chest pain after 1 nitro pill. Based on these symptoms I recommended that he go to the ER to be evaluated. Patient stated that he did not want to go to the ER. I explained that the ER could run all of the test and draw labs and possibly find out what might be causing his chest pain today. He then stated that he had family coming into town tomorrow and he did not want to go to the ER and then be admitted. I explained that we were still going to recommend that he go to the ER at that time he stated that he wasn't going to go to the ER. I told him that going to the ER was the best way to get him help right now and asked if there was anything else I could help him with? Patient stated that there was nothing else.

## 2022-02-17 NOTE — Telephone Encounter (Signed)
Spoke with pt's wife. She stated that the pt was worried about his tests and wants to be seen sooner. He has an appt on 02-24-22. She stated that he is satisfied with the appt. Encouraged her to call 911 if he has any urgent symptoms. She agreed and verbalized understanding.

## 2022-02-17 NOTE — Telephone Encounter (Signed)
Pt's wife called back stating that nothing has changed and the pt is refusing to go to ER, but did want a sooner appt. Scheduled to the [place] office with Coletta Memos, NP, but told pt's wife we would send another message to triage to discuss the chest pain. Please advise.

## 2022-02-19 NOTE — Progress Notes (Unsigned)
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   Obesity 05/12/2017   Osteoarthritis of knee 05/12/2017   Paresthesia 05/12/2017   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   Prostate nodule 05/12/2017   Right rotator cuff tear 10/01/2021   Seborrheic dermatitis 05/12/2017   Past Surgical History:  Procedure Laterality Date   HAND SURGERY Right    ORIF ANKLE FRACTURE Left 08/15/2007   Distal fibular   REPLACEMENT TOTAL KNEE Right 07/12/2012   RIGHT/LEFT HEART CATH AND CORONARY ANGIOGRAPHY N/A 01/12/2018   Procedure: RIGHT/LEFT HEART CATH AND CORONARY ANGIOGRAPHY;  Surgeon: Belva Crome, MD;  Location: [facility];  Service: Cardiovascular;  Laterality: N/A;   SHOULDER SURGERY Right    UMBILICAL HERNIA REPAIR  06/23/2013    Allergies  Allergies  Allergen Reactions   Wasp Venom Anaphylaxis, Itching and Swelling    History of Present Illness    ***  Home Medications    Prior to Admission medications   Medication Sig Start Date End Date Taking? Authorizing Provider  amLODipine (NORVASC) 10 MG tablet TAKE ONE TABLET BY MOUTH EVERY DAY FOR BLOOD PRESSURE * NOTE: NEW DOSE * 06/24/20   [provider]  aspirin 81 MG chewable tablet Chew 81 mg by mouth daily at 12 noon.     [provider]  atorvastatin (LIPITOR) 80 MG tablet Take 40 mg by mouth at bedtime.     [provider]  Calcium Carbonate-Vitamin D 600-400 MG-UNIT tablet  Take 1 tablet by mouth 2 (two) times daily. 04/17/20   [provider]  cetirizine (ZYRTEC) 10 MG tablet Take 10 mg by mouth daily as needed for allergies.    [provider]  diclofenac Sodium (VOLTAREN) 1 % GEL Apply 1 application topically 4 (four) times daily as needed (pain).    [provider]  DULoxetine (CYMBALTA) 60 MG capsule Take 60 mg by mouth every evening.     [provider]  empagliflozin (JARDIANCE) 10 MG TABS tablet Take 1 tablet (10 mg total) by mouth daily before breakfast. 10/22/21   Haydee Salter, MD  EPINEPHrine 0.3 mg/0.3 mL IJ SOAJ injection Inject 0.3 mg into the muscle daily as needed (for anaphylaxis).     [provider]  finasteride (PROSCAR) 5 MG tablet Take 5 mg by mouth daily.    [provider]  furosemide (LASIX) 40 MG tablet Take 1 tablet (40 mg total) by mouth daily. 12/24/21   Richardo Priest, MD  glipiZIDE (GLUCOTROL) 10 MG tablet in the morning and at bedtime. 04/29/21   [provider]  meloxicam (MOBIC) 15 MG tablet Take 7.5 mg by mouth 2 (two) times daily as needed for pain.     [provider]  metFORMIN (GLUCOPHAGE) 1000 MG tablet Take 1,000 mg by mouth 2 (two) times daily with a meal.     [provider]  methadone (DOLOPHINE)  5 MG tablet Take 5 mg by mouth every other day. For severe chronic pain    [provider]  omeprazole (PRILOSEC) 40 MG capsule Take 40 mg by mouth daily.    [provider]  oxybutynin (DITROPAN) 5 MG tablet Take 5 mg by mouth at bedtime. 10/17/21   [provider]  oxyCODONE (OXY IR/ROXICODONE) 5 MG immediate release tablet Take 5 mg by mouth 4 (four) times daily as needed (severe chronic pain).     [provider]  sacubitril-valsartan (ENTRESTO) 49-51 MG Take 1 tablet by mouth 2 (two) times daily. 12/24/21   Richardo Priest, MD  spironolactone (ALDACTONE) 25 MG tablet Take 1 tablet (25 mg total) by mouth daily.  01/21/22   Haydee Salter, MD  tamsulosin (FLOMAX) 0.4 MG CAPS capsule TAKE ONE CAPSULE BY MOUTH EVERY NIGHT FOR URINATION/PROSTATE * REPLACES TERAZOSIN * 06/24/20   [provider]  tiZANidine (ZANAFLEX) 4 MG tablet TAKE ONE TABLET BY MOUTH EVERY EVENING AS NEEDED FOR MUSCLE SPASMS 01/30/20   [provider]    Family History    Family History  Problem Relation Age of Onset   Hypertension Mother    Hypertension Father    Alzheimer's disease Father    Heart disease Paternal Aunt    He indicated that his mother is deceased. He indicated that his father is deceased. He indicated that his paternal aunt is deceased.  Social History    Social History   Socioeconomic History   Marital status: Married    Spouse name: [name]   Number of children: Not on file   Years of education: Not on file   Highest education level: Not on file  Occupational History   Occupation: retired Nature conservation officer  Tobacco Use   Smoking status: Every Day    Packs/day: 1.00    Years: 55.00    Total pack years: 55.00    Types: Cigarettes    Passive exposure: Current   Smokeless tobacco: Never   Tobacco comments:    Has cut back  Vaping Use   Vaping Use: Never used  Substance and Sexual Activity   Alcohol use: No   Drug use: No    Comment: takes prescriptions as prescribed: oxycodone and methadone   Sexual activity: Never    Birth control/protection: None  Other Topics Concern   Not on file  Social History Narrative   Lives at home with wife   Right handed   Caffeine: 2-4 cups/day   Social Determinants of Health   Financial Resource Strain: Low Risk  (11/26/2021)   Overall Financial Resource Strain (CARDIA)    Difficulty of Paying Living Expenses: Not hard at all  Food Insecurity: No Food Insecurity (11/26/2021)   Hunger Vital Sign    Worried About Running Out of Food in the Last Year: Never true    Ran Out of Food in the Last Year: Never true  Transportation Needs: No Transportation  Needs (11/26/2021)   PRAPARE - Hydrologist (Medical): No    Lack of Transportation (Non-Medical): No  Physical Activity: Insufficiently Active (11/26/2021)   Exercise Vital Sign    Days of Exercise per Week: 3 days    Minutes of Exercise per Session: 30 min  Stress: No Stress Concern Present (11/26/2021)   [place]    Feeling of Stress : Not at all  Social Connections: [place] (11/26/2021)   Social Connection and  Isolation Panel [NHANES]    Frequency of Communication with Friends and Family: Three times a week    Frequency of Social Gatherings with Friends and Family: Three times a week    Attends Religious Services: More than 4 times per year    Active Member of Clubs or Organizations: Yes    Attends Archivist Meetings: 1 to 4 times per year    Marital Status: Married  Human resources officer Violence: Not At Risk (11/26/2021)   Humiliation, Afraid, Rape, and Kick questionnaire    Fear of Current or Ex-Partner: No    Emotionally Abused: No    Physically Abused: No    Sexually Abused: No     Review of Systems    General:  No chills, fever, night sweats or weight changes.  Cardiovascular:  No chest pain, dyspnea on exertion, edema, orthopnea, palpitations, paroxysmal nocturnal dyspnea. Dermatological: No rash, lesions/masses Respiratory: No cough, dyspnea Urologic: No hematuria, dysuria Abdominal:   No nausea, vomiting, diarrhea, bright red blood per rectum, melena, or hematemesis Neurologic:  No visual changes, wkns, changes in mental status. All other systems reviewed and are otherwise negative except as noted above.  Physical Exam    VS:  There were no vitals taken for this visit. , BMI There is no height or weight on file to calculate BMI. GEN: Well nourished, well developed, in no acute distress. HEENT: normal. Neck: Supple, no JVD, carotid bruits, or  masses. Cardiac: RRR, no murmurs, rubs, or gallops. No clubbing, cyanosis, edema.  Radials/DP/PT 2+ and equal bilaterally.  Respiratory:  Respirations regular and unlabored, clear to auscultation bilaterally. GI: Soft, nontender, nondistended, BS + x 4. MS: no deformity or atrophy. Skin: warm and dry, no rash. Neuro:  Strength and sensation are intact. Psych: Normal affect.  Accessory Clinical Findings    Recent Labs: 04/16/2021: Hemoglobin 13.9; Platelets 277 01/20/2022: BUN 26; Creatinine, Ser 1.39; Potassium 4.4; Sodium 141   Recent Lipid Panel No results found for: "CHOL", "TRIG", "HDL", "CHOLHDL", "VLDL", "LDLCALC", "LDLDIRECT"  ECG personally reviewed by me today- *** - No acute changes  Assessment & Plan   1.  ***   Jossie Ng. Clare Fennimore NP-C    02/19/2022, 8:04 AM [address] [place] [address] Office [phone] Fax [phone]  Notice: This dictation was prepared with Dragon dictation along with smaller phrase technology. Any transcriptional errors that result from this process are unintentional and may not be corrected upon review.  I spent***minutes examining this patient, reviewing medications, and using patient centered shared decision making involving her cardiac care.  Prior to her visit I spent greater than 20 minutes reviewing her past medical history,  medications, and prior cardiac tests.

## 2022-02-20 ENCOUNTER — Encounter: Payer: Self-pay | Admitting: Cardiology

## 2022-02-20 ENCOUNTER — Telehealth: Payer: Self-pay

## 2022-02-20 ENCOUNTER — Encounter (HOSPITAL_COMMUNITY): Payer: Self-pay

## 2022-02-20 ENCOUNTER — Emergency Department (HOSPITAL_COMMUNITY)
Admission: EM | Admit: 2022-02-20 | Discharge: 2022-02-20 | Disposition: A | Discharge status: Home / Self Care | Payer: No Typology Code available for payment source | Attending: Emergency Medicine | Admitting: Emergency Medicine

## 2022-02-20 ENCOUNTER — Emergency Department (HOSPITAL_BASED_OUTPATIENT_CLINIC_OR_DEPARTMENT_OTHER): Payer: No Typology Code available for payment source

## 2022-02-20 ENCOUNTER — Other Ambulatory Visit: Payer: Self-pay

## 2022-02-20 DIAGNOSIS — I110 Hypertensive heart disease with heart failure: Secondary | ICD-10-CM | POA: Diagnosis present

## 2022-02-20 DIAGNOSIS — I3139 Other pericardial effusion (noninflammatory): Secondary | ICD-10-CM

## 2022-02-20 DIAGNOSIS — I509 Heart failure, unspecified: Secondary | ICD-10-CM | POA: Insufficient documentation

## 2022-02-20 DIAGNOSIS — I2510 Atherosclerotic heart disease of native coronary artery without angina pectoris: Secondary | ICD-10-CM | POA: Diagnosis present

## 2022-02-20 DIAGNOSIS — G4730 Sleep apnea, unspecified: Secondary | ICD-10-CM | POA: Diagnosis present

## 2022-02-20 DIAGNOSIS — K219 Gastro-esophageal reflux disease without esophagitis: Secondary | ICD-10-CM | POA: Diagnosis present

## 2022-02-20 DIAGNOSIS — I350 Nonrheumatic aortic (valve) stenosis: Secondary | ICD-10-CM

## 2022-02-20 DIAGNOSIS — I5032 Chronic diastolic (congestive) heart failure: Secondary | ICD-10-CM | POA: Diagnosis present

## 2022-02-20 DIAGNOSIS — E785 Hyperlipidemia, unspecified: Secondary | ICD-10-CM | POA: Diagnosis present

## 2022-02-20 DIAGNOSIS — Z7984 Long term (current) use of oral hypoglycemic drugs: Secondary | ICD-10-CM | POA: Diagnosis not present

## 2022-02-20 DIAGNOSIS — Z7982 Long term (current) use of aspirin: Secondary | ICD-10-CM | POA: Diagnosis not present

## 2022-02-20 DIAGNOSIS — R079 Chest pain, unspecified: Secondary | ICD-10-CM | POA: Diagnosis present

## 2022-02-20 DIAGNOSIS — Z79899 Other long term (current) drug therapy: Secondary | ICD-10-CM | POA: Diagnosis not present

## 2022-02-20 DIAGNOSIS — R7989 Other specified abnormal findings of blood chemistry: Secondary | ICD-10-CM

## 2022-02-20 LAB — BASIC METABOLIC PANEL
Anion gap: 14 (ref 5–15)
BUN: 31 mg/dL — ABNORMAL HIGH (ref 8–23)
CO2: 25 mmol/L (ref 22–32)
Calcium: 9.3 mg/dL (ref 8.9–10.3)
Chloride: 102 mmol/L (ref 98–111)
Creatinine, Ser: 1.81 mg/dL — ABNORMAL HIGH (ref 0.61–1.24)
GFR, Estimated: 38 mL/min — ABNORMAL LOW (ref >60)
Glucose, Bld: 153 mg/dL — ABNORMAL HIGH (ref 70–99)
Potassium: 4 mmol/L (ref 3.5–5.1)
Sodium: 141 mmol/L (ref 135–145)

## 2022-02-20 LAB — CBC
HCT: 44.3 % (ref 39.0–52.0)
Hemoglobin: 15.4 g/dL (ref 13.0–17.0)
MCH: 32 pg (ref 26.0–34.0)
MCHC: 34.8 g/dL (ref 30.0–36.0)
MCV: 92.1 fL (ref 80.0–100.0)
Platelets: 297 10*3/uL (ref 150–400)
RBC: 4.81 MIL/uL (ref 4.22–5.81)
RDW: 12.2 % (ref 11.5–15.5)
WBC: 8.5 10*3/uL (ref 4.0–10.5)
nRBC: 0 % (ref 0.0–0.2)

## 2022-02-20 LAB — ECHOCARDIOGRAM LIMITED

## 2022-02-20 NOTE — Discharge Instructions (Signed)
Follow-up with the cardiology team as recommended by Dr Alveda Reasons.  Continue your current medications.  Return for lightheadedness dizziness, shortness of breath worsening chest pain

## 2022-02-20 NOTE — Consult Note (Signed)
Cardiology Consultation:   Patient ID: Thomas [name] MRN: [id number]; DOB: [birth year]  Admit date: 02/20/2022 Date of Consult: 02/20/2022  PCP:  Haydee Salter, MD   [facility] Providers Cardiologist:  None   { Click here to update MD or APP on Care Team, Refresh:1}     Patient Profile:   Thomas [name] is a 76 y.o. male with a hx of *** who is being seen 02/20/2022 for the evaluation of *** at the request of ***.  History of Present Illness:   Mr. 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   Prostate nodule 05/12/2017   Right rotator cuff tear 10/01/2021   Seborrheic dermatitis 05/12/2017    Past Surgical History:  Procedure Laterality Date   HAND SURGERY Right    ORIF ANKLE FRACTURE Left 08/15/2007   Distal fibular   REPLACEMENT TOTAL KNEE Right 07/12/2012   RIGHT/LEFT HEART CATH AND CORONARY ANGIOGRAPHY N/A 01/12/2018   Procedure: RIGHT/LEFT HEART CATH AND CORONARY ANGIOGRAPHY;  Surgeon: Belva Crome, MD;  Location: [facility];  Service: Cardiovascular;  Laterality: N/A;   SHOULDER SURGERY Right    UMBILICAL HERNIA REPAIR  06/23/2013     {Home Medications (Optional):21181}  Inpatient Medications: Scheduled Meds:  Continuous Infusions:  PRN Meds:   Allergies:    Allergies  Allergen Reactions   Wasp Venom Anaphylaxis, Itching and Swelling    Social History:   Social History   Socioeconomic History   Marital status: Married    Spouse name: [name]   Number of children: Not on file   Years of  education: Not on file   Highest education level: Not on file  Occupational History   Occupation: retired Nature conservation officer  Tobacco Use   Smoking status: Every Day    Packs/day: 1.00    Years: 55.00    Total pack years: 55.00    Types: Cigarettes    Passive exposure: Current   Smokeless tobacco: Never   Tobacco comments:    Has cut back  Vaping Use   Vaping Use: Never used  Substance and Sexual Activity   Alcohol use: No   Drug use: No    Comment: takes prescriptions as prescribed: oxycodone and methadone   Sexual activity: Never    Birth control/protection: None  Other Topics Concern   Not on file  Social History Narrative   Lives at home with wife   Right handed   Caffeine: 2-4 cups/day   Social Determinants of Health   Financial Resource Strain: Low Risk  (11/26/2021)   Overall Financial Resource Strain (CARDIA)    Difficulty of Paying Living Expenses: Not hard at all  Food Insecurity: No Food Insecurity (11/26/2021)   Hunger Vital Sign    Worried About Running Out of Food in the Last Year: Never true    Ran Out of Food in the Last Year: Never true  Transportation Needs: No Transportation Needs (11/26/2021)   PRAPARE - Hydrologist (Medical):  No    Lack of Transportation (Non-Medical): No  Physical Activity: Insufficiently Active (11/26/2021)   Exercise Vital Sign    Days of Exercise per Week: 3 days    Minutes of Exercise per Session: 30 min  Stress: No Stress Concern Present (11/26/2021)   [place]    Feeling of Stress : Not at all  Social Connections: [place] (11/26/2021)   Social Connection and Isolation Panel [NHANES]    Frequency of Communication with Friends and Family: Three times a week    Frequency of Social Gatherings with Friends and Family: Three times a week    Attends Religious Services: More than 4 times per year    Active Member of Clubs or  Organizations: Yes    Attends Archivist Meetings: 1 to 4 times per year    Marital Status: Married  Human resources officer Violence: Not At Risk (11/26/2021)   Humiliation, Afraid, Rape, and Kick questionnaire    Fear of Current or Ex-Partner: No    Emotionally Abused: No    Physically Abused: No    Sexually Abused: No    Family History:   *** Family History  Problem Relation Age of Onset   Hypertension Mother    Hypertension Father    Alzheimer's disease Father    Heart disease Paternal Aunt      ROS:  Please see the history of present illness.  *** All other ROS reviewed and negative.     Physical Exam/Data:   Vitals:   02/20/22 1516 02/20/22 1700 02/20/22 1737  BP: 128/85 (!) 168/95   Pulse: 69 64   Resp: 15 11   Temp: 98.6 F (37 C)  98.6 F (37 C)  TempSrc: Oral  Oral  SpO2: 98% 100%    No intake or output data in the 24 hours ending 02/20/22 2205    01/20/2022    5:23 PM 12/24/2021   10:42 AM 11/11/2021    9:37 AM  Last 3 Weights  Weight (lbs) 227 lb 9.6 oz 232 lb 225 lb  Weight (kg) 103.239 kg 105.235 kg 102.059 kg     There is no height or weight on file to calculate BMI.  General:  Well nourished, well developed, in no acute distress*** HEENT: normal Neck: no JVD Vascular: No carotid bruits; Distal pulses 2+ bilaterally Cardiac:  normal S1, S2; RRR; no murmur *** Lungs:  clear to auscultation bilaterally, no wheezing, rhonchi or rales  Abd: soft, nontender, no hepatomegaly  Ext: no edema Musculoskeletal:  No deformities, BUE and BLE strength normal and equal Skin: warm and dry  Neuro:  CNs 2-12 intact, no focal abnormalities noted Psych:  Normal affect   EKG:  The EKG was personally reviewed and demonstrates:  *** Telemetry:  Telemetry was personally reviewed and demonstrates:  ***  Relevant CV Studies: ***  Laboratory Data:  High Sensitivity Troponin:  No results for input(s): "TROPONINIHS" in the last 720 hours.   Chemistry Recent  Labs  Lab 02/20/22 1548  NA 141  K 4.0  CL 102  CO2 25  GLUCOSE 153*  BUN 31*  CREATININE 1.81*  CALCIUM 9.3  GFRNONAA 38*  ANIONGAP 14    No results for input(s): "PROT", "ALBUMIN", "AST", "ALT", "ALKPHOS", "BILITOT" in the last 168 hours. Lipids No results for input(s): "CHOL", "TRIG", "HDL", "LABVLDL", "LDLCALC", "CHOLHDL" in the last 168 hours.  Hematology Recent Labs  Lab 02/20/22 1548  WBC 8.5  RBC  4.81  HGB 15.4  HCT 44.3  MCV 92.1  MCH 32.0  MCHC 34.8  RDW 12.2  PLT 297   Thyroid No results for input(s): "TSH", "FREET4" in the last 168 hours.  BNPNo results for input(s): "BNP", "PROBNP" in the last 168 hours.  DDimer No results for input(s): "DDIMER" in the last 168 hours.   Radiology/Studies:  ECHOCARDIOGRAM LIMITED  Result Date: 02/20/2022    ECHOCARDIOGRAM REPORT   Patient Name:   JAQUAVIAN FIRKUS Date of Exam: 02/20/2022 Medical Rec #:  [id number]     Height:       68.0 in Accession #:    [id number]    Weight:       227.6 lb Date of Birth:  [birth year]     BSA:          2.159 m Patient Age:    45 years      BP:           128/85 mmHg Patient Gender: M             HR:           68 bpm. Exam Location:  Inpatient Procedure: Limited Echo, Limited Color Doppler and Cardiac Doppler Indications:    pericardial effusion  History:        Patient has prior history of Echocardiogram examinations, most                 recent 01/06/2022. CAD, chronic kidney disease; Risk                 Factors:Hypertension, Dyslipidemia and Diabetes.  Sonographer:    Johny Chess RDCS Referring Phys: [place]  1. Left ventricular ejection fraction, by estimation, is 55 to 60%. The left ventricle has normal function. Left ventricular endocardial border not optimally defined to evaluate regional wall motion. There is mild left ventricular hypertrophy. Left ventricular diastolic function could not be evaluated.  2. Right ventricular systolic function is normal. The right ventricular  size is normal.  3. Left atrial size was mildly dilated.  4. Overall effusion similar in size to 01/06/22. Slightly larger over RV anterior wall but slightly smaller in LV posterior dimension No RV collapse Prominent epicardial adipose tissue IVC is dilated . Moderate pericardial effusion. The pericardial effusion is circumferential.  5. The mitral valve was not assessed. No evidence of mitral valve regurgitation.  6. The aortic valve was not assessed. Aortic valve regurgitation is not visualized. FINDINGS  Left Ventricle: Left ventricular ejection fraction, by estimation, is 55 to 60%. The left ventricle has normal function. Left ventricular endocardial border not optimally defined to evaluate regional wall motion. The left ventricular internal cavity size was normal in size. There is mild left ventricular hypertrophy. Left ventricular diastolic function could not be evaluated. Right Ventricle: The right ventricular size is normal. No increase in right ventricular wall thickness. Right ventricular systolic function is normal. Left Atrium: Left atrial size was mildly dilated. Right Atrium: Right atrial size was normal in size. Pericardium: Overall effusion similar in size to 01/06/22. Slightly larger over RV anterior wall but slightly smaller in LV posterior dimension No RV collapse Prominent epicardial adipose tissue IVC is dilated. A moderately sized pericardial effusion is present. The pericardial effusion is circumferential. Mitral Valve: The mitral valve was not assessed. No evidence of mitral valve regurgitation. Tricuspid Valve: The tricuspid valve is not assessed. Tricuspid valve regurgitation is not demonstrated. Aortic Valve: The aortic valve was not assessed.  Aortic valve regurgitation is not visualized. Pulmonic Valve: The pulmonic valve was not assessed. Pulmonic valve regurgitation is not visualized. Aorta: Aortic root could not be assessed. IAS/Shunts: The interatrial septum was not assessed.  IVC IVC  diam: 2.00 cm Jenkins Rouge MD Electronically signed by Jenkins Rouge MD Signature Date/Time: 02/20/2022/5:19:39 PM    Final      Assessment and Plan:   ***   Risk Assessment/Risk Scores:  {Complete the following score calculators/questions to meet required metrics.  Press F2         :[id number]}   {Is the patient being seen for CHEST PAIN, UNSTABLE ANGINA, NSTEMI or STEMI?     :[id number]} {Does this patient have CHF or CHF symptoms?      :[id number]} {Does this patient have ATRIAL FIBRILLATION?[phone]}   For questions or updates, please contact [place] Please consult [email or web address] for contact info under    Signed, Elouise Munroe, MD  02/20/2022 10:05 PM

## 2022-02-20 NOTE — ED Provider Notes (Signed)
[facility] EMERGENCY DEPARTMENT Provider Note   CSN: [id number] Arrival date & time: 02/20/22  1455     History {Add pertinent medical, surgical, social history, OB history to HPI:1} Chief Complaint  Patient presents with  . Shortness of Breath    Thomas [name] is a 76 y.o. male.   Shortness of Breath Associated symptoms: no fever     Patient has a history of PTSD, pulmonary edema, pericardial effusion, GERD, hyperlipidemia, hypertensive heart disease with heart failure, myoclonic jerking, seborrheic dermatitis, mild aortic stenosis, atypical chest pain, coronary artery disease presents ED for evaluation of a cardiac effusion.  Patient states he has been having issues with chest pain.  He has been seen by his cardiologist as well as his primary care doctor.  Patient had a echocardiogram completed in May.  It showed moderate degree of fluid in the pericardium.  Cardiac MRI was recommended to see if it needs to be drained.  This was done on June 1.  Patient was called today because of the results of the MRI that showed a large pericardial effusion measuring up to 26 mm.  He has severe LVH.  Patient was sent to the ED to evaluate for evidence of cardiac tamponade  Home Medications Prior to Admission medications   Medication Sig Start Date End Date Taking? Authorizing Provider  amLODipine (NORVASC) 10 MG tablet TAKE ONE TABLET BY MOUTH EVERY DAY FOR BLOOD PRESSURE * NOTE: NEW DOSE * 06/24/20   [provider]  aspirin 81 MG chewable tablet Chew 81 mg by mouth daily at 12 noon.     [provider]  atorvastatin (LIPITOR) 80 MG tablet Take 40 mg by mouth at bedtime.     [provider]  Calcium Carbonate-Vitamin D 600-400 MG-UNIT tablet Take 1 tablet by mouth 2 (two) times daily. 04/17/20   [provider]  cetirizine (ZYRTEC) 10 MG tablet Take 10 mg by mouth daily as needed for allergies.    [provider]  diclofenac Sodium  (VOLTAREN) 1 % GEL Apply 1 application topically 4 (four) times daily as needed (pain).    [provider]  DULoxetine (CYMBALTA) 60 MG capsule Take 60 mg by mouth every evening.     [provider]  empagliflozin (JARDIANCE) 10 MG TABS tablet Take 1 tablet (10 mg total) by mouth daily before breakfast. 10/22/21   Haydee Salter, MD  EPINEPHrine 0.3 mg/0.3 mL IJ SOAJ injection Inject 0.3 mg into the muscle daily as needed (for anaphylaxis).     [provider]  finasteride (PROSCAR) 5 MG tablet Take 5 mg by mouth daily.    [provider]  furosemide (LASIX) 40 MG tablet Take 1 tablet (40 mg total) by mouth daily. 12/24/21   Richardo Priest, MD  glipiZIDE (GLUCOTROL) 10 MG tablet in the morning and at bedtime. 04/29/21   [provider]  meloxicam (MOBIC) 15 MG tablet Take 7.5 mg by mouth 2 (two) times daily as needed for pain.     [provider]  metFORMIN (GLUCOPHAGE) 1000 MG tablet Take 1,000 mg by mouth 2 (two) times daily with a meal.     [provider]  methadone (DOLOPHINE) 5 MG tablet Take 5 mg by mouth every other day. For severe chronic pain    [provider]  omeprazole (PRILOSEC) 40 MG capsule Take 40 mg by mouth daily.    [provider]  oxybutynin (DITROPAN) 5 MG tablet Take 5  mg by mouth at bedtime. 10/17/21   [provider]  oxyCODONE (OXY IR/ROXICODONE) 5 MG immediate release tablet Take 5 mg by mouth 4 (four) times daily as needed (severe chronic pain).     [provider]  sacubitril-valsartan (ENTRESTO) 49-51 MG Take 1 tablet by mouth 2 (two) times daily. 12/24/21   Richardo Priest, MD  spironolactone (ALDACTONE) 25 MG tablet Take 1 tablet (25 mg total) by mouth daily. 01/21/22   Haydee Salter, MD  tamsulosin (FLOMAX) 0.4 MG CAPS capsule TAKE ONE CAPSULE BY MOUTH EVERY NIGHT FOR URINATION/PROSTATE * REPLACES TERAZOSIN * 06/24/20   [provider]  tiZANidine (ZANAFLEX) 4  MG tablet TAKE ONE TABLET BY MOUTH EVERY EVENING AS NEEDED FOR MUSCLE SPASMS 01/30/20   [provider]      Allergies    Wasp venom    Review of Systems   Review of Systems  Constitutional:  Negative for fever.  Respiratory:  Positive for shortness of breath.     Physical Exam Updated Vital Signs BP 128/85 (BP Location: Right Arm)   Pulse 69   Temp 98.6 F (37 C) (Oral)   Resp 15   SpO2 98%  Physical Exam Vitals and nursing note reviewed.  Constitutional:      General: He is not in acute distress.    Appearance: He is well-developed.  HENT:     Head: Normocephalic and atraumatic.     Right Ear: External ear normal.     Left Ear: External ear normal.  Eyes:     General: No scleral icterus.       Right eye: No discharge.        Left eye: No discharge.     Conjunctiva/sclera: Conjunctivae normal.  Neck:     Trachea: No tracheal deviation.  Cardiovascular:     Rate and Rhythm: Normal rate and regular rhythm.     Comments: Systolic blood pressure respiratory variation measured at 10 mmHg Pulmonary:     Effort: Pulmonary effort is normal. No respiratory distress.     Breath sounds: Normal breath sounds. No stridor.  Abdominal:     General: There is no distension.  Musculoskeletal:        General: No swelling or deformity.     Cervical back: Neck supple.  Skin:    General: Skin is warm and dry.     Findings: No rash.  Neurological:     Mental Status: He is alert.     Cranial Nerves: Cranial nerve deficit: no gross deficits.    ED Results / Procedures / Treatments   Labs (all labs ordered are listed, but only abnormal results are displayed) Labs Reviewed  BASIC METABOLIC PANEL - Abnormal; Notable for the following components:      Result Value   Glucose, Bld 153 (*)    BUN 31 (*)    Creatinine, Ser 1.81 (*)    GFR, Estimated 38 (*)    All other components within normal limits  CBC    EKG EKG Interpretation  Date/Time:  Friday February 20 2022  15:10:04 EDT Ventricular Rate:  79 PR Interval:  168 QRS Duration: 98 QT Interval:  426 QTC Calculation: 488 R Axis:   -67 Text Interpretation: Normal sinus rhythm Left anterior fascicular block Left ventricular hypertrophy ( R in aVL , Cornell product , Romhilt-Estes ) ST & T wave abnormality, consider lateral ischemia Prolonged QT Abnormal ECG When compared with ECG of 11-Dec-2019 13:08, No significant change  since last tracing Confirmed by Dorie Rank [phone]) on 02/20/2022 3:23:05 PM  Radiology ECHOCARDIOGRAM LIMITED  Result Date: 02/20/2022    ECHOCARDIOGRAM REPORT   Patient Name:   NIVAAN DICENZO Date of Exam: 02/20/2022 Medical Rec #:  [id number]     Height:       68.0 in Accession #:    [id number]    Weight:       227.6 lb Date of Birth:  Jul 02, 1946     BSA:          2.159 m Patient Age:    35 years      BP:           128/85 mmHg Patient Gender: M             HR:           68 bpm. Exam Location:  Inpatient Procedure: Limited Echo, Limited Color Doppler and Cardiac Doppler Indications:    pericardial effusion  History:        Patient has prior history of Echocardiogram examinations, most                 recent 01/06/2022. CAD, chronic kidney disease; Risk                 Factors:Hypertension, Dyslipidemia and Diabetes.  Sonographer:    Johny Chess RDCS Referring Phys: [place]  1. Left ventricular ejection fraction, by estimation, is 55 to 60%. The left ventricle has normal function. Left ventricular endocardial border not optimally defined to evaluate regional wall motion. There is mild left ventricular hypertrophy. Left ventricular diastolic function could not be evaluated.  2. Right ventricular systolic function is normal. The right ventricular size is normal.  3. Left atrial size was mildly dilated.  4. Overall effusion similar in size to 01/06/22. Slightly larger over RV anterior wall but slightly smaller in LV posterior dimension No RV collapse Prominent epicardial adipose  tissue IVC is dilated . Moderate pericardial effusion. The pericardial effusion is circumferential.  5. The mitral valve was not assessed. No evidence of mitral valve regurgitation.  6. The aortic valve was not assessed. Aortic valve regurgitation is not visualized. FINDINGS  Left Ventricle: Left ventricular ejection fraction, by estimation, is 55 to 60%. The left ventricle has normal function. Left ventricular endocardial border not optimally defined to evaluate regional wall motion. The left ventricular internal cavity size was normal in size. There is mild left ventricular hypertrophy. Left ventricular diastolic function could not be evaluated. Right Ventricle: The right ventricular size is normal. No increase in right ventricular wall thickness. Right ventricular systolic function is normal. Left Atrium: Left atrial size was mildly dilated. Right Atrium: Right atrial size was normal in size. Pericardium: Overall effusion similar in size to 01/06/22. Slightly larger over RV anterior wall but slightly smaller in LV posterior dimension No RV collapse Prominent epicardial adipose tissue IVC is dilated. A moderately sized pericardial effusion is present. The pericardial effusion is circumferential. Mitral Valve: The mitral valve was not assessed. No evidence of mitral valve regurgitation. Tricuspid Valve: The tricuspid valve is not assessed. Tricuspid valve regurgitation is not demonstrated. Aortic Valve: The aortic valve was not assessed. Aortic valve regurgitation is not visualized. Pulmonic Valve: The pulmonic valve was not assessed. Pulmonic valve regurgitation is not visualized. Aorta: Aortic root could not be assessed. IAS/Shunts: The interatrial septum was not assessed.  IVC IVC diam: 2.00 cm Jenkins Rouge MD Electronically signed by Jenkins Rouge MD Signature  Date/Time: 02/20/2022/5:19:39 PM    Final     Procedures Procedures  {Document cardiac monitor, telemetry assessment procedure when  appropriate:1}  Medications Ordered in ED Medications - No data to display  ED Course/ Medical Decision Making/ A&P Clinical Course as of 02/20/22 1722  Fri Feb 20, 2022  1631 I discussed the case with Dr. Alveda Reasons.  She recommends repeat echocardiogram.  Cardiology will see the patient in the ED [JK]  1649 CBC Normal [JK]  7035 Basic metabolic panel(!) Creatinine increased compared to previous values [JK]  1654 Patient is angry about the delay.  He feels that I lied to him about speaking to cardiology.  However Dr. Alveda Reasons is at the bedside speaking to the patient currently and the patient is receiving his echocardiogram [JK]    Clinical Course User Index [JK] Dorie Rank, MD                           Medical Decision Making Amount and/or Complexity of Data Reviewed External Data Reviewed: radiology and notes.    Details: Cardiology notes and recent MRI and echocardiogram results reviewed Labs: ordered. Decision-making details documented in ED Course. Discussion of management or test interpretation with external provider(s): Case reviewed with Dr. Alveda Reasons after the echocardiogram.  The echocardiogram does show large pericardial effusion but no signs of tamponade physiology.  Patient does not want to stay in the hospital.  Plan is for discharge and close outpatient follow-up  Patient presented to the ED for evaluation  {Document critical care time when appropriate:1} {Document review of labs and clinical decision tools ie heart score, Chads2Vasc2 etc:1}  {Document your independent review of radiology images, and any outside records:1} {Document your discussion with family members, caretakers, and with consultants:1} {Document social determinants of health affecting pt's care:1} {Document your decision making why or why not admission, treatments were needed:1} Final Clinical Impression(s) / ED Diagnoses Final diagnoses:  Pericardial effusion  Elevated serum creatinine    Rx /  DC Orders ED Discharge Orders     None

## 2022-02-20 NOTE — ED Triage Notes (Signed)
Sent by cardiology for large cardiac effusion.

## 2022-02-20 NOTE — Progress Notes (Unsigned)
I fielded a call regarding the MRI. His effusion is felt to be large after discussing with the interpreting physician will be contacted sent to the emergency room [facility] to have further evaluation including urgent echocardiogram looking for signs of tamponade.

## 2022-02-20 NOTE — Telephone Encounter (Signed)
Dr. Herbert Spires called Dr. Bettina Gavia stating that pt had a large cardiac effusion and should go to the ER at [facility] for evaluation and he would call ahead with the information. Called pt and wife on speaker phone. She stated that they would get ready and proceed to the ER. She stated that the pt just told her that he was not going. She stated that she would call back if he would not go to the ER as directed by the physicians.

## 2022-02-20 NOTE — Progress Notes (Signed)
  Echocardiogram 2D Echocardiogram has been performed.  Thomas [name] 02/20/2022, 5:43 PM

## 2022-02-23 NOTE — Progress Notes (Unsigned)
Office Visit    Patient Name: Thomas [name] Date of Encounter: 02/23/2022  Primary Care Provider:  Haydee Salter, MD Primary Cardiologist:  None Primary Electrophysiologist: None  Chief Complaint    Thomas [name] is a 76 y.o. male with PMH of aortic stenosis, HLD, HTN, severe COPD, diastolic CHF, GERD, DM type II who presents today for follow-up of pericardial effusion.  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   Prostate nodule 05/12/2017   Right rotator cuff tear 10/01/2021   Seborrheic dermatitis 05/12/2017   Past Surgical History:  Procedure Laterality Date   HAND SURGERY Right    ORIF ANKLE FRACTURE Left 08/15/2007   Distal fibular   REPLACEMENT TOTAL KNEE Right 07/12/2012   RIGHT/LEFT HEART CATH AND CORONARY ANGIOGRAPHY N/A 01/12/2018   Procedure: RIGHT/LEFT HEART CATH AND CORONARY ANGIOGRAPHY;  Surgeon: Belva Crome, MD;  Location: [facility];  Service: Cardiovascular;  Laterality: N/A;   SHOULDER SURGERY Right    UMBILICAL HERNIA REPAIR  06/23/2013    Allergies  Allergies  Allergen Reactions   Wasp Venom Anaphylaxis, Itching and Swelling    History of Present Illness    Thomas [name] is a 76 year old male with the above-mentioned past medical history who was initially followed by the [facility] cardiology following small pericardial effusion and reduced EF that was seen on 2D echo. He was initially seen by Dr. Bettina Gavia and 2018 for management of CHF. Cardiac CTA was performed 12/2017 following  ED visit for chest pain that revealed severe coronary and mild to moderate aortic calcification. R/LHC was performed 01/2018 that revealed multiple lesions of 60% with nonobstructive CAD. Cardiac CTA was performed on 08/2019 which revealed moderate pericardial effusion.   Cardiac CT was performed on 12/08/2021 due to lung nodule on chest x-ray that showed enlarged stable moderate pericardial effusion and dilated pulmonary artery.  He was seen in follow-up by Dr. Bettina Gavia on 12/2021 and had complaint of wheezing and shortness of breath.He is noted to have severe COPD. 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 2D echo was performed on 01/06/2022 that revealed EF of 50-55%, grade II DD, mild aortic stenosis, moderate dilated LA, severe LVH, and a moderate pericardial effusion.  Cardiac MR was ordered to evaluate and quantify pericardial thickening that revealed increased effusion and patient was  referred to ED to rule out cardiac tamponade.   In the ED a limited echo was performed that showed normal RV function with no diastolic collapse  that was  not suggestive of tamponade. Hospital admission with inpatient pericardiocentesis was offered during the visit  however patient elected to follow-up outpatient.  Patient was minimally symptomatic and shared decision was made with plan for close follow-up.  Since last being seen in the office patient reports that since his ED visit he has not experienced any increase in his chest pain. He is euvolemic on examination with exception of dependent edema in bilateral lower  extremities.  He is compliant with his current medication regimen and his blood pressure today is well controlled at 124/60 with heart rate of 72. He denies palpitations, dyspnea, PND, orthopnea, nausea, vomiting, dizziness, syncope, edema, weight gain, or early satiety.   Home Medications    Current Outpatient Medications  Medication Sig Dispense Refill   amLODipine (NORVASC)  10 MG tablet TAKE ONE TABLET BY MOUTH EVERY DAY FOR BLOOD PRESSURE * NOTE: NEW DOSE *     aspirin 81 MG chewable tablet Chew 81 mg by mouth daily at 12 noon.      atorvastatin (LIPITOR) 80 MG tablet Take 40 mg by mouth at bedtime.      Calcium Carbonate-Vitamin D 600-400 MG-UNIT tablet Take 1 tablet by mouth 2 (two) times daily.     cetirizine (ZYRTEC) 10 MG tablet Take 10 mg by mouth daily as needed for allergies.     diclofenac Sodium (VOLTAREN) 1 % GEL Apply 1 application topically 4 (four) times daily as needed (pain).     DULoxetine (CYMBALTA) 60 MG capsule Take 60 mg by mouth every evening.      empagliflozin (JARDIANCE) 10 MG TABS tablet Take 1 tablet (10 mg total) by mouth daily before breakfast. 30 tablet 11   EPINEPHrine 0.3 mg/0.3 mL IJ SOAJ injection Inject 0.3 mg into the muscle daily as needed (for anaphylaxis).      finasteride (PROSCAR) 5 MG tablet Take 5 mg by mouth daily.     furosemide (LASIX) 40 MG tablet Take 1 tablet (40 mg total) by mouth daily. 90 tablet 3   glipiZIDE (GLUCOTROL) 10 MG tablet in the morning and at bedtime.     meloxicam (MOBIC) 15 MG tablet Take 7.5 mg by mouth 2 (two) times daily as needed for pain.      metFORMIN (GLUCOPHAGE) 1000 MG tablet Take 1,000 mg by mouth 2 (two) times daily with a meal.      methadone (DOLOPHINE) 5 MG tablet Take 5 mg by mouth every other day. For severe chronic pain     omeprazole (PRILOSEC) 40 MG capsule Take 40 mg by mouth daily.     oxybutynin (DITROPAN) 5 MG tablet Take 5 mg by mouth at bedtime.     oxyCODONE (OXY IR/ROXICODONE) 5 MG immediate release tablet Take 5 mg by mouth 4 (four) times daily as needed (severe chronic pain).      sacubitril-valsartan (ENTRESTO) 49-51 MG Take 1 tablet by mouth 2 (two) times daily. 180 tablet 3   spironolactone (ALDACTONE) 25 MG tablet Take 1 tablet (25 mg total) by mouth daily. 90 tablet 3   tamsulosin (FLOMAX) 0.4 MG CAPS capsule TAKE ONE CAPSULE BY MOUTH EVERY NIGHT FOR  URINATION/PROSTATE * REPLACES TERAZOSIN *     tiZANidine (ZANAFLEX) 4 MG tablet TAKE ONE TABLET BY MOUTH EVERY EVENING AS NEEDED FOR MUSCLE SPASMS     No current facility-administered medications for this visit.     Review of Systems  Please see the history of present illness.    (+) Intermittent Chest pain (+) Fatigue  All other systems reviewed and are otherwise negative except as noted above.  Physical Exam    Wt Readings from Last 3 Encounters:  01/20/22 227 lb 9.6 oz (103.2 kg)  12/24/21 232 lb (105.2 kg)  11/11/21 225 lb (102.1 kg)   JW:JXBJY were no vitals filed for this visit.,There is no height or weight on file to calculate BMI.  Constitutional:      Appearance: Healthy  appearance. Not in distress.  Neck:     Vascular: JVD normal.  Pulmonary:     Effort: Pulmonary effort is normal.     Breath sounds: No wheezing. No rales. Diminished in the bases Cardiovascular:     Normal rate. Regular rhythm. Normal S1. Normal S2.      Murmurs: There is no murmur.  Edema:    Peripheral edema absent.  Abdominal:     Palpations: Abdomen is soft non tender. There is no hepatomegaly.  Skin:    General: Skin is warm and dry.  Neurological:     General: No focal deficit present.     Mental Status: Alert and oriented to person, place and time.     Cranial Nerves: Cranial nerves are intact.  EKG/LABS/Other Studies Reviewed    ECG personally reviewed by me today -none completed today  Risk Assessment/Calculations:     Lab Results  Component Value Date   WBC 8.5 02/20/2022   HGB 15.4 02/20/2022   HCT 44.3 02/20/2022   MCV 92.1 02/20/2022   PLT 297 02/20/2022   Lab Results  Component Value Date   CREATININE 1.81 (H) 02/20/2022   BUN 31 (H) 02/20/2022   NA 141 02/20/2022   K 4.0 02/20/2022   CL 102 02/20/2022   CO2 25 02/20/2022   Lab Results  Component Value Date   ALT 21 12/13/2019   AST 27 12/13/2019   ALKPHOS 104 12/13/2019   BILITOT 1.0 12/13/2019   No  results found for: "CHOL", "HDL", "LDLCALC", "LDLDIRECT", "TRIG", "CHOLHDL"  Lab Results  Component Value Date   HGBA1C 8.8 (A) 10/22/2021    Assessment & Plan    1.  Pericardial effusion: -s/p limited Echo in the ED that revealed similar findings to MRI but without evidence of tamponade. -Patient elects to have pericardiocentesis performed and we will arrange with cardiac Cath Lab.- - Case discussed with DOD Dr. Gwenlyn Found who feels that pericardiocentesis would be appropriate and we will start patient on colchicine 0.6 mg twice daily for 3 months with plan to possibly start ibuprofen following pericardiocentesis -Continue Lasix 40 mg daily BMET today to evaluate renal function  2. HFpEF: -Last 2D echo with EF of 55-60% -Continue GDMT with Jardiance 10 mg, Lasix 40 mg daily, Entresto 49/51 mg, and spironolactone 25 mg daily. -Patient is euvolemic on examination today with exception of dependent edema in lower extremities related to amlodipine -Low sodium diet, fluid restriction <2L, and daily weights encouraged. Educated to contact our office for weight gain of 2 lbs overnight or 5 lbs in one week.   3.  Essential hypertension: -Blood pressure today was well controlled at 124/60 -Continue Entresto 49/51, and spironolactone 25 mg daily  4.  Mild aortic stenosis: -Cardiac CTA completed 6/16 with mild AS noted -2D echo completed on 01/2022 with bicuspid AV and gradient measuring 22.8 mmHg   Disposition: Follow-up with None or APP in 2 weeks post pericardiocentesis Shared Decision Making/Informed Consent The risks  benefits and alternatives of a pericardial centesis were discussed in detail with Mr. Demarais and he is willing to proceed.   Medication Adjustments/Labs and Tests Ordered: Current medicines are reviewed at length with the patient today.  Concerns regarding medicines are outlined above.   Signed, Mable Fill, Marissa Nestle, NP 02/23/2022, 11:05 AM [place]

## 2022-02-24 ENCOUNTER — Encounter: Payer: Self-pay | Admitting: General Practice

## 2022-02-24 ENCOUNTER — Other Ambulatory Visit: Payer: Self-pay | Admitting: Nurse Practitioner

## 2022-02-24 ENCOUNTER — Ambulatory Visit (INDEPENDENT_AMBULATORY_CARE_PROVIDER_SITE_OTHER): Payer: Medicare Other | Admitting: Nurse Practitioner

## 2022-02-24 VITALS — BP 124/60 | HR 72 | Ht 71.0 in | Wt 218.6 lb

## 2022-02-24 DIAGNOSIS — E782 Mixed hyperlipidemia: Secondary | ICD-10-CM | POA: Diagnosis not present

## 2022-02-24 DIAGNOSIS — I3139 Other pericardial effusion (noninflammatory): Secondary | ICD-10-CM | POA: Diagnosis not present

## 2022-02-24 DIAGNOSIS — I10 Essential (primary) hypertension: Secondary | ICD-10-CM | POA: Diagnosis not present

## 2022-02-24 DIAGNOSIS — I5032 Chronic diastolic (congestive) heart failure: Secondary | ICD-10-CM

## 2022-02-24 DIAGNOSIS — I350 Nonrheumatic aortic (valve) stenosis: Secondary | ICD-10-CM

## 2022-02-24 MED ORDER — COLCHICINE 0.6 MG PO TABS: 0.6000 mg | ORAL_TABLET | Freq: Two times a day (BID) | ORAL | 2 refills | Status: DC

## 2022-02-24 NOTE — Patient Instructions (Signed)
Medication Instructions:  TAKE COLCHICINE 0.6MG DAILY FOR 3 MONTHS  *If you need a refill on your cardiac medications before your next appointment, please call your pharmacy*  Lab Work:    BMET TODAY     If you have labs (blood work) drawn today and your tests are completely normal, you will receive your results only by: [place] (if you have MyChart) OR  A paper copy in the mail If you have any lab test that is abnormal or we need to change your treatment, we will call you to review the results.  Testing/Procedures:  PER9CARDIALCENTESIS [employer] ARRIVE AT [place] BE HAVING AND ECHO BEFORE THIS PROCEDURE.  Follow-Up: Your next appointment:  AFTER  In Person with  DR Caren Macadam  1  At [facility], you and your health needs are our priority.  As part of our continuing mission to provide you with exceptional heart care, we have created designated Provider Care Teams.  These Care Teams include your primary Cardiologist (physician) and Advanced Practice Providers (APPs -  Physician Assistants and Nurse Practitioners) who all work together to provide you with the care you need, when you need it.   Important Information About Sugar

## 2022-02-25 ENCOUNTER — Telehealth: Payer: Self-pay | Admitting: *Deleted

## 2022-02-25 ENCOUNTER — Other Ambulatory Visit: Payer: Self-pay | Admitting: *Deleted

## 2022-02-25 ENCOUNTER — Other Ambulatory Visit: Payer: Self-pay | Admitting: Nurse Practitioner

## 2022-02-25 DIAGNOSIS — I3139 Other pericardial effusion (noninflammatory): Secondary | ICD-10-CM

## 2022-02-25 DIAGNOSIS — I5032 Chronic diastolic (congestive) heart failure: Secondary | ICD-10-CM

## 2022-02-25 LAB — BASIC METABOLIC PANEL
BUN/Creatinine Ratio: 23 (ref 10–24)
BUN: 35 mg/dL — ABNORMAL HIGH (ref 8–27)
CO2: 22 mmol/L (ref 20–29)
Calcium: 9.5 mg/dL (ref 8.6–10.2)
Chloride: 104 mmol/L (ref 96–106)
Creatinine, Ser: 1.54 mg/dL — ABNORMAL HIGH (ref 0.76–1.27)
Glucose: 91 mg/dL (ref 70–99)
Potassium: 4.7 mmol/L (ref 3.5–5.2)
Sodium: 144 mmol/L (ref 134–144)
eGFR: 46 mL/min/{1.73_m2} — ABNORMAL LOW (ref >59)

## 2022-02-25 NOTE — Telephone Encounter (Addendum)
Pericardiocentesis scheduled at [facility] for: Thursday February 26, 2022 9 AM Arrival time and place: [facility] Main Entrance A at: 7 AM   Nothing to eat or drink after midnight prior to procedure.  Medication instructions: -Hold:  Lasix-AM of procedure  Glipizide/Jardiance/Metformin-AM of procedure.  Confirmed patient has responsible adult to drive home post procedure and be with patient first 24 hours after arriving home.  Patient reports no new symptoms concerning for COVID-19 in the past 10 days.  Reviewed procedure instructions with patient's wife (DPR).   [facility] aware date/time of procedure, need limited echo at that time.

## 2022-02-26 ENCOUNTER — Ambulatory Visit (HOSPITAL_BASED_OUTPATIENT_CLINIC_OR_DEPARTMENT_OTHER): Payer: Medicare Other

## 2022-02-26 ENCOUNTER — Other Ambulatory Visit: Payer: Self-pay

## 2022-02-26 ENCOUNTER — Ambulatory Visit (HOSPITAL_COMMUNITY)
Admission: RE | Admit: 2022-02-26 | Discharge: 2022-02-26 | Disposition: A | Discharge status: Home / Self Care | Payer: Medicare Other | Attending: Cardiovascular Disease | Admitting: Cardiovascular Disease

## 2022-02-26 ENCOUNTER — Encounter (HOSPITAL_COMMUNITY)
Admission: RE | Disposition: A | Discharge status: Home / Self Care | Payer: Self-pay | Source: Home / Self Care | Attending: Cardiovascular Disease

## 2022-02-26 DIAGNOSIS — E119 Type 2 diabetes mellitus without complications: Secondary | ICD-10-CM | POA: Diagnosis not present

## 2022-02-26 DIAGNOSIS — Z538 Procedure and treatment not carried out for other reasons: Secondary | ICD-10-CM | POA: Diagnosis not present

## 2022-02-26 DIAGNOSIS — G4730 Sleep apnea, unspecified: Secondary | ICD-10-CM | POA: Insufficient documentation

## 2022-02-26 DIAGNOSIS — I3139 Other pericardial effusion (noninflammatory): Secondary | ICD-10-CM

## 2022-02-26 DIAGNOSIS — I2510 Atherosclerotic heart disease of native coronary artery without angina pectoris: Secondary | ICD-10-CM | POA: Diagnosis not present

## 2022-02-26 DIAGNOSIS — F17200 Nicotine dependence, unspecified, uncomplicated: Secondary | ICD-10-CM | POA: Diagnosis not present

## 2022-02-26 DIAGNOSIS — I10 Essential (primary) hypertension: Secondary | ICD-10-CM | POA: Insufficient documentation

## 2022-02-26 DIAGNOSIS — I350 Nonrheumatic aortic (valve) stenosis: Secondary | ICD-10-CM | POA: Diagnosis not present

## 2022-02-26 LAB — ECHOCARDIOGRAM COMPLETE
AR max vel: 1.32 cm2
AV Area VTI: 1.27 cm2
AV Area mean vel: 1.31 cm2
AV Mean grad: 14 mmHg
AV Peak grad: 23.2 mmHg
Ao pk vel: 2.41 m/s
Area-P 1/2: 3.5 cm2
Calc EF: 57.1 %
Height: 71 in
S' Lateral: 4.1 cm
Single Plane A2C EF: 60.6 %
Single Plane A4C EF: 52.6 %
Weight: 3392 oz

## 2022-02-26 LAB — GLUCOSE, CAPILLARY: Glucose-Capillary: 161 mg/dL — ABNORMAL HIGH (ref 70–99)

## 2022-02-26 SURGERY — PERICARDIOCENTESIS
Anesthesia: LOCAL

## 2022-02-26 MED ORDER — SODIUM CHLORIDE 0.9% FLUSH: 3.0000 mL | INTRAVENOUS | Status: DC | PRN

## 2022-02-26 MED ORDER — SODIUM CHLORIDE 0.9 % IV SOLN
250.0000 mL | INTRAVENOUS | Status: DC | PRN
Start: 2022-02-26 — End: 2022-02-26

## 2022-02-26 MED ORDER — SODIUM CHLORIDE 0.9 % IV SOLN: INTRAVENOUS | Status: DC

## 2022-02-26 MED ORDER — SODIUM CHLORIDE 0.9% FLUSH: 3.0000 mL | Freq: Two times a day (BID) | INTRAVENOUS | Status: DC

## 2022-03-04 DIAGNOSIS — E119 Type 2 diabetes mellitus without complications: Secondary | ICD-10-CM | POA: Diagnosis not present

## 2022-03-04 DIAGNOSIS — M79672 Pain in left foot: Secondary | ICD-10-CM | POA: Diagnosis not present

## 2022-03-04 DIAGNOSIS — M79671 Pain in right foot: Secondary | ICD-10-CM | POA: Diagnosis not present

## 2022-03-04 DIAGNOSIS — B351 Tinea unguium: Secondary | ICD-10-CM | POA: Diagnosis not present

## 2022-03-06 NOTE — Progress Notes (Unsigned)
Office Visit    Patient Name: Thomas [name] Date of Encounter: 03/09/2022  Primary Care Provider:  Haydee Salter, MD Primary Cardiologist:  Shirlee More, MD Primary Electrophysiologist: None  Chief Complaint   STORM [name] is a 76 y.o. male with PMH of aortic stenosis, HLD, HTN, severe COPD, diastolic CHF, GERD, DM type II who presents today for follow-up of pericardial effusion.  Past Medical History    Past Medical History:  Diagnosis Date   Actinic keratosis 05/12/2017   Back pain    Depression 05/12/2017   Diabetes ([place])    GERD (gastroesophageal reflux disease) 05/12/2017   Hx of colonic polyps 05/12/2017   Hyperlipidemia 05/12/2017   Hypertension    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/2017   Low back pain 05/12/2017   Lumbar radiculopathy 05/12/2017   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   Obesity 05/12/2017   Osteoarthritis of knee 05/12/2017   Paresthesia 05/12/2017   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   Prostate nodule 05/12/2017   Right rotator cuff tear 10/01/2021   Seborrheic dermatitis 05/12/2017   Past Surgical History:  Procedure Laterality Date   HAND SURGERY Right    ORIF ANKLE FRACTURE Left 08/15/2007   Distal fibular   REPLACEMENT TOTAL KNEE Right 07/12/2012   RIGHT/LEFT HEART CATH AND CORONARY ANGIOGRAPHY N/A 01/12/2018   Procedure: RIGHT/LEFT HEART CATH AND CORONARY ANGIOGRAPHY;  Surgeon: Belva Crome, MD;  Location: [place] [facility];  Service: Cardiovascular;  Laterality: N/A;   SHOULDER SURGERY Right    UMBILICAL HERNIA REPAIR  06/23/2013    Allergies  Allergies  Allergen Reactions   Wasp Venom Anaphylaxis, Itching and Swelling    History of Present Illness   Thomas [name] is a 76 year old male with the above-mentioned past medical history who was initially followed by the [facility] cardiology following small pericardial effusion and reduced EF that was seen on 2D echo. He was initially seen by Dr. Bettina Gavia and 2018 for management of CHF. Cardiac CTA was performed 12/2017  following ED visit for chest pain that revealed severe coronary and mild to moderate aortic calcification. R/LHC was performed 01/2018 that revealed multiple lesions of 60% with nonobstructive CAD. Cardiac CTA was performed on 08/2019 which revealed moderate pericardial effusion.    Cardiac CT was performed on 12/08/2021 due to lung nodule on chest x-ray that showed enlarged stable moderate pericardial effusion and dilated pulmonary artery.  He was seen in follow-up by Dr. Bettina Gavia on 12/2021 and had complaint of wheezing and shortness of breath.He is noted to have severe COPD. 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 2D echo was performed on 01/06/2022 that revealed EF of 50-55%, grade II DD, mild aortic stenosis, moderate dilated LA, severe LVH, and a moderate pericardial effusion.  Cardiac MR was ordered to evaluate and quantify pericardial thickening that revealed increased effusion and patient was  referred to ED to rule out cardiac tamponade.    In the ED a limited echo was performed that showed normal RV function with no diastolic collapse  that was  not suggestive of tamponade. Hospital admission with inpatient pericardiocentesis was offered during the visit  however patient elected to follow-up outpatient.  Patient was minimally symptomatic and shared decision was made with plan for close follow-up.   Since last being seen in the office patient reports that he is still experiencing chest pain that last for 15-30 seconds.During last visit patient was arranged for pericardiocentesis however on the day of procedure  limited echo revealed minimal fluid for drainage. The procedure was canceled and patient presents today for follow-up.   He does state that the colchicine has been helping to reduce the intensity of his chest pain.  He is euvolemic and his shortness of breath is much improved today. He is down 6 pounds today and 15 pounds since his ED visit. He denies palpitations,  dyspnea, PND, orthopnea, nausea, vomiting, dizziness, syncope, edema, weight gain, or early satiety.  Home Medications    Current Outpatient Medications  Medication Sig Dispense Refill   amLODipine (NORVASC) 10 MG tablet Take 10 mg by mouth in the morning. Heart Medication     aspirin 81 MG chewable tablet Chew 81 mg by mouth daily at 12 noon. Heart medication     atorvastatin (LIPITOR) 80 MG tablet Take 40 mg by mouth at bedtime. Heart medication     Calcium Carbonate-Vitamin D 600-400 MG-UNIT tablet Take 1 tablet by mouth 2 (two) times daily.     cetirizine (ZYRTEC) 10 MG tablet Take 10 mg by mouth daily as needed for allergies.     colchicine 0.6 MG tablet Take 1 tablet (0.6 mg total) by mouth 2 (two) times daily. (Patient taking differently: Take 0.6 mg by mouth 2 (two) times daily. Heart Medication) 60 tablet 2   diclofenac Sodium (VOLTAREN) 1 % GEL Apply 1 application topically 4 (four) times daily as needed (pain).     DULoxetine (CYMBALTA) 60 MG capsule Take 60 mg by mouth every evening.      empagliflozin (JARDIANCE) 10 MG TABS tablet Take 1 tablet (10 mg total) by mouth daily before breakfast. (Patient taking differently: Take 10 mg by mouth daily before breakfast. Heart Medication) 30 tablet 11   EPINEPHrine 0.3 mg/0.3 mL IJ SOAJ injection Inject 0.3 mg into the muscle daily as needed (for anaphylaxis).      furosemide (LASIX) 40 MG tablet Take 1 tablet (40 mg total) by mouth daily. (Patient taking differently: Take 40 mg by mouth daily. Heart medication) 90 tablet 3   glipiZIDE (GLUCOTROL) 10 MG tablet Take 10 mg by mouth in the morning and at bedtime.     Ibuprofen 200 MG CAPS Take 3 capsules (600 mg total) by mouth every 8 (eight) hours. X 2 weeks. (Patient taking differently: Take 600 mg by mouth every 8 (eight) hours. X 2 weeks. Heart medication) 120 capsule 0   meloxicam (MOBIC) 15 MG tablet Take 7.5 mg by mouth in the morning and at bedtime.     metFORMIN (GLUCOPHAGE) 1000 MG  tablet Take 1,000 mg by mouth 2 (two) times daily with a meal.      methadone (DOLOPHINE) 5 MG tablet Take 5 mg by mouth every other day. For severe chronic pain     omeprazole (PRILOSEC) 40 MG capsule Take 40 mg by mouth daily before breakfast.     oxybutynin (DITROPAN) 5 MG tablet Take 5 mg by mouth at bedtime.     oxyCODONE (OXY IR/ROXICODONE) 5 MG immediate release tablet Take 5 mg by mouth 4 (four) times daily as needed (severe chronic pain).      sacubitril-valsartan (ENTRESTO) 49-51 MG Take 1 tablet by mouth 2 (two) times daily. (Patient taking differently: Take 1 tablet by mouth 2 (two) times daily. Heart Medication) 180 tablet 3   spironolactone (ALDACTONE) 25 MG tablet Take 1 tablet (25 mg total) by mouth daily. (Patient taking differently: Take 25 mg by mouth daily. Heart medication) 90 tablet 3   tamsulosin (FLOMAX)  0.4 MG CAPS capsule TAKE ONE CAPSULE BY MOUTH EVERY NIGHT FOR URINATION/PROSTATE * REPLACES TERAZOSIN *     tiZANidine (ZANAFLEX) 4 MG tablet Take 4 mg by mouth at bedtime.     Vibegron 75 MG TABS Take 75 mg by mouth daily.     No current facility-administered medications for this visit.     Review of Systems  Please see the history of present illness.    (+)Chest Pain  All other systems reviewed and are otherwise negative except as noted above.  Physical Exam    Wt Readings from Last 3 Encounters:  03/09/22 217 lb 6.4 oz (98.6 kg)  02/26/22 212 lb (96.2 kg)  02/24/22 218 lb 9.6 oz (99.2 kg)   VS: Vitals:   03/09/22 1011  BP: 124/76  Pulse: 84  SpO2: 93%  ,Body mass index is 30.32 kg/m.  Constitutional:      Appearance: Healthy appearance. Not in distress.  Neck:     Vascular: JVD normal.  Pulmonary:     Effort: Pulmonary effort is normal.     Breath sounds: No wheezing. No rales. Diminished in the bases Cardiovascular:     Normal rate. Regular rhythm. Normal S1. Normal S2.      Murmurs: There is no murmur.  Edema:    Peripheral edema absent.   Abdominal:     Palpations: Abdomen is soft non tender. There is no hepatomegaly.  Skin:    General: Skin is warm and dry.  Neurological:     General: No focal deficit present.     Mental Status: Alert and oriented to person, place and time.     Cranial Nerves: Cranial nerves are intact.  EKG/LABS/Other Studies Reviewed    ECG personally reviewed by me today -sinus rhythm with left anterior fascicular block and no acute changes compared to previous EKG.  Lab Results  Component Value Date   WBC 8.5 02/20/2022   HGB 15.4 02/20/2022   HCT 44.3 02/20/2022   MCV 92.1 02/20/2022   PLT 297 02/20/2022   Lab Results  Component Value Date   CREATININE 1.54 (H) 02/24/2022   BUN 35 (H) 02/24/2022   NA 144 02/24/2022   K 4.7 02/24/2022   CL 104 02/24/2022   CO2 22 02/24/2022   Lab Results  Component Value Date   ALT 21 12/13/2019   AST 27 12/13/2019   ALKPHOS 104 12/13/2019   BILITOT 1.0 12/13/2019   No results found for: "CHOL", "HDL", "LDLCALC", "LDLDIRECT", "TRIG", "CHOLHDL"  Lab Results  Component Value Date   HGBA1C 8.8 (A) 10/22/2021    Assessment & Plan    1.  Pericardial effusion: -Pericardiocentesis was canceled due to small effusion, improved pain, and risk outweighing benefit -Patient states that his chest pain has improved since starting the colchicine 2 weeks ago. -We will start ibuprofen 600 mg 3 times daily for 2 weeks  2.  HFpEF: -Last 2D echo with EF of 55-60% -Continue GDMT with Jardiance 10 mg, Lasix 40 mg daily, Entresto 49/51 mg, and spironolactone 25 mg daily. -Patient is euvolemic on examination today with exception of dependent edema in lower extremities related to amlodipine -We will recheck a BMET today -Low sodium diet, fluid restriction <2L, and daily weights encouraged. Educated to contact our office for weight gain of 2 lbs overnight or 5 lbs in one week.   3.  Essential HTN: -Blood pressure today was 124/76 well-controlled -Continue  Entresto 49/51, Norvasc 10 mg and spironolactone 25 mg daily  4.  Mild aortic stenosis: --Cardiac CTA completed 6/16 with mild AS noted -2D echo completed on 01/2022 with bicuspid AV and gradient measuring 22.8 mmHg  Disposition: Follow-up with Shirlee More, MD or APP in 2 weeks  Medication Adjustments/Labs and Tests Ordered: Current medicines are reviewed at length with the patient today.  Concerns regarding medicines are outlined above.   Signed, Mable Fill, Marissa Nestle, NP 03/09/2022, 11:04 AM [place]

## 2022-03-09 ENCOUNTER — Encounter: Payer: Self-pay | Admitting: Nurse Practitioner

## 2022-03-09 ENCOUNTER — Ambulatory Visit (INDEPENDENT_AMBULATORY_CARE_PROVIDER_SITE_OTHER): Payer: Medicare Other | Admitting: Nurse Practitioner

## 2022-03-09 VITALS — BP 124/76 | HR 84 | Ht 71.0 in | Wt 217.4 lb

## 2022-03-09 DIAGNOSIS — I3139 Other pericardial effusion (noninflammatory): Secondary | ICD-10-CM

## 2022-03-09 DIAGNOSIS — I5032 Chronic diastolic (congestive) heart failure: Secondary | ICD-10-CM | POA: Diagnosis not present

## 2022-03-09 DIAGNOSIS — I10 Essential (primary) hypertension: Secondary | ICD-10-CM

## 2022-03-09 DIAGNOSIS — E782 Mixed hyperlipidemia: Secondary | ICD-10-CM | POA: Diagnosis not present

## 2022-03-09 DIAGNOSIS — I350 Nonrheumatic aortic (valve) stenosis: Secondary | ICD-10-CM

## 2022-03-09 LAB — BASIC METABOLIC PANEL
BUN/Creatinine Ratio: 14 (ref 10–24)
BUN: 25 mg/dL (ref 8–27)
CO2: 21 mmol/L (ref 20–29)
Calcium: 9.4 mg/dL (ref 8.6–10.2)
Chloride: 101 mmol/L (ref 96–106)
Creatinine, Ser: 1.74 mg/dL — ABNORMAL HIGH (ref 0.76–1.27)
Glucose: 194 mg/dL — ABNORMAL HIGH (ref 70–99)
Potassium: 4.4 mmol/L (ref 3.5–5.2)
Sodium: 138 mmol/L (ref 134–144)
eGFR: 40 mL/min/{1.73_m2} — ABNORMAL LOW (ref >59)

## 2022-03-09 MED ORDER — IBUPROFEN 200 MG PO CAPS: 600.0000 mg | ORAL_CAPSULE | Freq: Three times a day (TID) | ORAL | 0 refills | Status: DC

## 2022-03-09 NOTE — Patient Instructions (Signed)
Medication Instructions:   START Ibuprofen three (3) tablets by mouth (600 mg) every 8 hours X 2 weeks.   *If you need a refill on your cardiac medications before your next appointment, please call your pharmacy*   Lab Work:  TODAY!!!!! BMET  If you have labs (blood work) drawn today and your tests are completely normal, you will receive your results only by: [place] (if you have MyChart) OR A paper copy in the mail If you have any lab test that is abnormal or we need to change your treatment, we will call you to review the results.   Testing/Procedures:  None ordered.   Follow-Up: At [facility], you and your health needs are our priority.  As part of our continuing mission to provide you with exceptional heart care, we have created designated Provider Care Teams.  These Care Teams include your primary Cardiologist (physician) and Advanced Practice Providers (APPs -  Physician Assistants and Nurse Practitioners) who all work together to provide you with the care you need, when you need it.  We recommend signing up for the patient portal called "MyChart".  Sign up information is provided on this After Visit Summary.  MyChart is used to connect with patients for Virtual Visits (Telemedicine).  Patients are able to view lab/test results, encounter notes, upcoming appointments, etc.  Non-urgent messages can be sent to your provider as well.   To learn more about what you can do with MyChart, go to [email or web address].    Your next appointment:   3 week(s)  The format for your next appointment:   In Person  Provider:   Shirlee More, MD    Important Information About Sugar

## 2022-03-11 ENCOUNTER — Telehealth: Payer: Self-pay

## 2022-03-11 NOTE — Telephone Encounter (Signed)
Patient in shower. Spoke with wife and informed her of recommendations regarding lab result per Elvin So, NP..[name]"Your renal function is still elevated and your potassium is normal. Please make sure to stay hydrated and continue to weigh daily. Do not increase your current lasix dose without contacting us if you notice an increase in your weight.  Ambrose Pancoast, NP" Explained patient would weigh each morning after voiding and before eating. They will contact clinic if 3 pound wt gain overnight or 5 pound wt gain in 1 week. Patient will not take extra lasix and will contact clinic for any weight gain. Wife voiced understanding of this conversation.

## 2022-03-22 ENCOUNTER — Encounter: Payer: Self-pay | Admitting: Cardiology

## 2022-03-25 ENCOUNTER — Ambulatory Visit: Payer: Medicare Other | Admitting: Cardiology

## 2022-04-01 ENCOUNTER — Ambulatory Visit (INDEPENDENT_AMBULATORY_CARE_PROVIDER_SITE_OTHER): Payer: Medicare Other | Admitting: Cardiology

## 2022-04-01 ENCOUNTER — Encounter: Payer: Self-pay | Admitting: Cardiology

## 2022-04-01 VITALS — BP 128/60 | HR 82 | Ht 71.0 in | Wt 220.0 lb

## 2022-04-01 DIAGNOSIS — I519 Heart disease, unspecified: Secondary | ICD-10-CM

## 2022-04-01 DIAGNOSIS — I350 Nonrheumatic aortic (valve) stenosis: Secondary | ICD-10-CM

## 2022-04-01 DIAGNOSIS — I5042 Chronic combined systolic (congestive) and diastolic (congestive) heart failure: Secondary | ICD-10-CM

## 2022-04-01 DIAGNOSIS — I110 Hypertensive heart disease with heart failure: Secondary | ICD-10-CM

## 2022-04-01 DIAGNOSIS — I3139 Other pericardial effusion (noninflammatory): Secondary | ICD-10-CM | POA: Diagnosis not present

## 2022-04-01 DIAGNOSIS — I25119 Atherosclerotic heart disease of native coronary artery with unspecified angina pectoris: Secondary | ICD-10-CM

## 2022-04-01 MED ORDER — ENTRESTO 97-103 MG PO TABS: 1.0000 | ORAL_TABLET | Freq: Two times a day (BID) | ORAL | 3 refills | Status: DC

## 2022-04-01 NOTE — Progress Notes (Signed)
Cardiology Office Note:    Date:  04/01/2022   ID:  TIMOUTHY GILARDI, DOB [birth year], MRN [id number]  PCP:  Haydee Salter, MD  Cardiologist:  Shirlee More, MD    Referring MD: Haydee Salter, MD    ASSESSMENT:    1. Pericardial effusion   2. Hypertensive heart disease with heart failure ([place])   3. Chronic combined systolic and diastolic heart failure ([place])   4. Aortic stenosis, mild   5. Coronary artery disease involving native coronary artery of native heart with angina pectoris ([place])   6. LV dysfunction    PLAN:    In order of problems listed above:  Medical loss to reconcile of the effusion be large and a cardiac MR but not large enough to be drained in the cardiac Cath Lab this is chronic unrelated to his current symptoms has no evidence of constrictive or effusive physiology and does not require further evaluation or intervention I would certainly not be referring to CT surgery for pericardial window. He is improved blood pressure is at target heart failure is markedly improved he has had a marked diuresis we will continue his current treatment and optimize therapy by up titration of Entresto continue his loop diuretic SGLT2 inhibitor hold on MRA with his CKD and consider beta-blocker next visit Stable CAD continue medical treatment He has very mild aortic stenosis   Next appointment: 3 months   Medication Adjustments/Labs and Tests Ordered: Current medicines are reviewed at length with the patient today.  Concerns regarding medicines are outlined above.  No orders of the defined types were placed in this encounter.  Meds ordered this encounter  Medications   sacubitril-valsartan (ENTRESTO) 97-103 MG    Sig: Take 1 tablet by mouth 2 (two) times daily.    Dispense:  180 tablet    Refill:  3    Chief Complaint  Patient presents with   Follow-up    Pericardial effusion     History of Present Illness:    MRK BUZBY is a 76 y.o. male with a hx of hypertensive  heart disease with heart failure aortic stenosis mild nonobstructive CAD and chronic pericardial effusion.  He was last seen 12/24/2021.  He had a repeat echocardiogram performed 01/06/2022 showing a moderate pericardial effusion EF 50 to 55% pseudo normal diastolic function moderate left atrial enlargement and mild aortic stenosis.  The pericardium was thickened he was referred for cardiac MR.  Compliance with diet, lifestyle and medications: Yes  Cardiac MRI was interpreted 02/05/2022 which showed severe left ventricular hypertrophy small amount of LGE less than 1% AF 30% right ventricle normal size and function.  Hemoglobin interpreted as large pericardial effusion adjacent to the RV free wall 26 mm. It was a Friday afternoon and because of this finding he was sent seen in the emergency room felt not to have tamponade subsequently was seen by another provider in [place] referred for pericardiocentesis and when he arrived point-of-care ultrasound showed an effusion that was not large enough to have the procedure performed safely.  This is a complex visit On one hand his weight is down 40 pounds overall he is better his strength his breathing but he is very unhappy He is not having angina or pericardial pain but he is having increased pain it seems to be fleeting predominantly in his belly at times point localized in the chest wall he is already taking narcotics and I told him this is something that is best managed with  pain management and/or his PCP.  He is unhappy with that answer. We discussed medication I told him Delene Loll is a good choice for him with LV dysfunction and heart failure and we will increase to 97 103 His diuretic has been affected he has lost 40 pounds and he will continue his current diuretic for CAD I would continue aspirin his high intensity statin.  He also continue his Jardiance.  He has CKD creatinine 1.74 GFR 40 cc at this time I would hold off on giving him . We will  reconsider beta-blocker therapy next visit. 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   Obesity 05/12/2017   Osteoarthritis of knee 05/12/2017   Paresthesia 05/12/2017   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   Prostate nodule 05/12/2017   Right rotator cuff tear 10/01/2021   Seborrheic dermatitis 05/12/2017    Past Surgical History:  Procedure Laterality Date   HAND SURGERY Right    ORIF ANKLE FRACTURE Left 08/15/2007   Distal fibular   REPLACEMENT TOTAL KNEE Right 07/12/2012   RIGHT/LEFT HEART CATH AND CORONARY ANGIOGRAPHY N/A 01/12/2018   Procedure: RIGHT/LEFT HEART CATH AND CORONARY ANGIOGRAPHY;  Surgeon: Belva Crome, MD;  Location: [place] [facility];  Service: Cardiovascular;  Laterality: N/A;   SHOULDER SURGERY Right    UMBILICAL HERNIA REPAIR  06/23/2013    Current Medications: Current Meds  Medication Sig   amLODipine (NORVASC) 10 MG tablet Take 10 mg by mouth in the morning. Heart Medication   aspirin 81 MG chewable tablet Chew 81 mg by mouth daily at 12 noon. Heart medication   atorvastatin (LIPITOR) 80 MG tablet Take 40 mg by mouth at bedtime. Heart medication   Calcium Carbonate-Vitamin D 600-400 MG-UNIT tablet Take 1 tablet by mouth 2 (two) times daily.   cetirizine (ZYRTEC) 10 MG tablet Take 10 mg by mouth daily as needed for allergies.   colchicine 0.6 MG tablet Take 1 tablet (0.6 mg total) by mouth 2 (two) times daily.   diclofenac Sodium (VOLTAREN) 1 % GEL Apply 1 application topically 4 (four) times daily as needed (pain).   DULoxetine (CYMBALTA) 60 MG capsule Take 60 mg by mouth every evening.    empagliflozin (JARDIANCE) 10 MG TABS tablet Take 1  tablet (10 mg total) by mouth daily before breakfast.   EPINEPHrine 0.3 mg/0.3 mL IJ SOAJ injection Inject 0.3 mg into the muscle daily as needed (for anaphylaxis).    furosemide (LASIX) 40 MG tablet Take 1 tablet (40 mg total) by mouth daily.   glipiZIDE (GLUCOTROL) 10 MG tablet Take 10 mg by mouth in the morning and at bedtime.   Ibuprofen 200 MG CAPS Take 3 capsules (600 mg total) by mouth every 8 (eight) hours. X 2 weeks.   meloxicam (MOBIC) 15 MG tablet Take 7.5 mg by mouth in the morning and at bedtime.   metFORMIN (GLUCOPHAGE) 1000 MG tablet Take 1,000 mg by mouth 2 (two) times daily with a meal.    methadone (DOLOPHINE) 5 MG tablet Take 5 mg by mouth every other day. For severe chronic pain   omeprazole (PRILOSEC) 40 MG capsule Take 40 mg by mouth  daily before breakfast.   oxyCODONE (OXY IR/ROXICODONE) 5 MG immediate release tablet Take 5 mg by mouth 4 (four) times daily as needed (severe chronic pain).    sacubitril-valsartan (ENTRESTO) 97-103 MG Take 1 tablet by mouth 2 (two) times daily.   tamsulosin (FLOMAX) 0.4 MG CAPS capsule TAKE ONE CAPSULE BY MOUTH EVERY NIGHT FOR URINATION/PROSTATE * REPLACES TERAZOSIN *   tiZANidine (ZANAFLEX) 4 MG tablet Take 4 mg by mouth at bedtime.   Vibegron 75 MG TABS Take 75 mg by mouth daily.   [DISCONTINUED] sacubitril-valsartan (ENTRESTO) 49-51 MG Take 1 tablet by mouth 2 (two) times daily.     Allergies:   Wasp venom   Social History   Socioeconomic History   Marital status: Married    Spouse name: [name]   Number of children: Not on file   Years of education: Not on file   Highest education level: Not on file  Occupational History   Occupation: retired Nature conservation officer  Tobacco Use   Smoking status: Every Day    Packs/day: 1.00    Years: 55.00    Total pack years: 55.00    Types: Cigarettes    Passive exposure: Current   Smokeless tobacco: Never   Tobacco comments:    Has cut back  Vaping Use   Vaping Use: Never used  Substance and  Sexual Activity   Alcohol use: No   Drug use: No    Comment: takes prescriptions as prescribed: oxycodone and methadone   Sexual activity: Never    Birth control/protection: None  Other Topics Concern   Not on file  Social History Narrative   Lives at home with wife   Right handed   Caffeine: 2-4 cups/day   Social Determinants of Health   Financial Resource Strain: Low Risk  (11/26/2021)   Overall Financial Resource Strain (CARDIA)    Difficulty of Paying Living Expenses: Not hard at all  Food Insecurity: No Food Insecurity (11/26/2021)   Hunger Vital Sign    Worried About Running Out of Food in the Last Year: Never true    Ran Out of Food in the Last Year: Never true  Transportation Needs: No Transportation Needs (11/26/2021)   PRAPARE - Hydrologist (Medical): No    Lack of Transportation (Non-Medical): No  Physical Activity: Insufficiently Active (11/26/2021)   Exercise Vital Sign    Days of Exercise per Week: 3 days    Minutes of Exercise per Session: 30 min  Stress: No Stress Concern Present (11/26/2021)   [place]    Feeling of Stress : Not at all  Social Connections: [place] (11/26/2021)   Social Connection and Isolation Panel [NHANES]    Frequency of Communication with Friends and Family: Three times a week    Frequency of Social Gatherings with Friends and Family: Three times a week    Attends Religious Services: More than 4 times per year    Active Member of Clubs or Organizations: Yes    Attends Archivist Meetings: 1 to 4 times per year    Marital Status: Married     Family History: The patient's family history includes Alzheimer's disease in his father; Heart disease in his paternal aunt; Hypertension in his father and mother. ROS:   Please see the history of present illness.    All other systems reviewed and are negative.  EKGs/Labs/Other  Studies Reviewed:    The following studies were  reviewed today: See history  Physical Exam:    VS:  BP 128/60 (BP Location: Left Arm, Patient Position: Sitting, Cuff Size: Normal)   Pulse 82   Ht _0  (1.803 m)   Wt 220 lb (99.8 kg)   SpO2 94%   BMI 30.68 kg/m     Wt Readings from Last 3 Encounters:  04/01/22 220 lb (99.8 kg)  03/09/22 217 lb 6.4 oz (98.6 kg)  02/26/22 212 lb (96.2 kg)     GEN: Looks better than previous visits obviously having back pain a little restless in the office because of his pain well nourished, well developed in no acute distress HEENT: Normal NECK: No JVD; No carotid bruits LYMPHATICS: No lymphadenopathy CARDIAC: RRR, no murmurs, rubs, gallops RESPIRATORY:  Clear to auscultation without rales, wheezing or rhonchi  ABDOMEN: Soft, non-tender, non-distended MUSCULOSKELETAL: He still has 1+ bilateral lower extremity pitting edema; No deformity  SKIN: Warm and dry NEUROLOGIC:  Alert and oriented x 3 PSYCHIATRIC:  Normal affect    Signed, Shirlee More, MD  04/01/2022 4:40 PM    [facility]

## 2022-04-01 NOTE — Patient Instructions (Signed)
Medication Instructions:  Your physician has recommended you make the following change in your medication:   START: Entresto 97-103 mg twice daily   *If you need a refill on your cardiac medications before your next appointment, please call your pharmacy*   Lab Work: None If you have labs (blood work) drawn today and your tests are completely normal, you will receive your results only by: [place] (if you have MyChart) OR A paper copy in the mail If you have any lab test that is abnormal or we need to change your treatment, we will call you to review the results.   Testing/Procedures: None   Follow-Up: At [facility], you and your health needs are our priority.  As part of our continuing mission to provide you with exceptional heart care, we have created designated Provider Care Teams.  These Care Teams include your primary Cardiologist (physician) and Advanced Practice Providers (APPs -  Physician Assistants and Nurse Practitioners) who all work together to provide you with the care you need, when you need it.  We recommend signing up for the patient portal called "MyChart".  Sign up information is provided on this After Visit Summary.  MyChart is used to connect with patients for Virtual Visits (Telemedicine).  Patients are able to view lab/test results, encounter notes, upcoming appointments, etc.  Non-urgent messages can be sent to your provider as well.   To learn more about what you can do with MyChart, go to [email or web address].    Your next appointment:   4 month(s)  The format for your next appointment:   In Person  Provider:   Shirlee More, MD    Other Instructions Take an extra furosemide daily if weight increases more than 3 pounds.  Important Information About Sugar

## 2022-04-10 ENCOUNTER — Telehealth: Payer: Self-pay | Admitting: Physical Medicine and Rehabilitation

## 2022-04-10 NOTE — Telephone Encounter (Signed)
Pt called requesting a call back to set an back injection. Pt phone number is [phone].

## 2022-04-14 ENCOUNTER — Ambulatory Visit (INDEPENDENT_AMBULATORY_CARE_PROVIDER_SITE_OTHER): Payer: Medicare Other | Admitting: Physical Medicine and Rehabilitation

## 2022-04-14 ENCOUNTER — Encounter: Payer: Self-pay | Admitting: Physical Medicine and Rehabilitation

## 2022-04-14 VITALS — BP 121/72 | HR 83

## 2022-04-14 DIAGNOSIS — M7918 Myalgia, other site: Secondary | ICD-10-CM

## 2022-04-14 DIAGNOSIS — G894 Chronic pain syndrome: Secondary | ICD-10-CM

## 2022-04-14 DIAGNOSIS — I25119 Atherosclerotic heart disease of native coronary artery with unspecified angina pectoris: Secondary | ICD-10-CM

## 2022-04-14 DIAGNOSIS — M546 Pain in thoracic spine: Secondary | ICD-10-CM

## 2022-04-14 NOTE — Progress Notes (Signed)
Pt state lower back pain that travels down his left leg. Pt state walking, standing and laying down makes the pain worse. Pt state his having right knee pain. Pt state he takes pain meds and uses pain cream to help ease his pain.  Numeric Pain Rating Scale and Functional Assessment Average Pain 9 Pain Right Now 8 My pain is intermittent, constant, dull, and aching Pain is worse with: walking, bending, sitting, standing, some activites, and laying down Pain improves with: heat/ice, medication, and injections   In the last MONTH (on 0-10 scale) has pain interfered with the following?  1. General activity like being  able to carry out your everyday physical activities such as walking, climbing stairs, carrying groceries, or moving a chair?  Rating(5)  2. Relation with others like being able to carry out your usual social activities and roles such as  activities at home, at work and in your community. Rating(6)  3. Enjoyment of life such that you have  been bothered by emotional problems such as feeling anxious, depressed or irritable?  Rating(7)

## 2022-04-14 NOTE — Progress Notes (Signed)
Thomas [name] - 76 y.o. male MRN [id number]  Date of birth: Sep 24, 1945  Office Visit Note: Visit Date: 04/14/2022 PCP: Haydee Salter, MD Referred by: Haydee Salter, MD  Subjective: Chief Complaint  Patient presents with   Lower Back - Pain   Right Knee - Pain   Left Leg - Pain   HPI: Thomas [name] is a 76 y.o. male who comes in today for evaluation of acute on chronic left sided thoracic back pain. Pain ongoing for 2-3 weeks, states he noticed pain after shoveling gravel and dirt outside. He describes pain as sore, aching and tender, currently rates pain as 7 out of 10. He reports some relief of pain with rest and medications. Currently taking Oxycodone 5 mg and Methadone 5 mg that is prescribed by his primary care provider Dr. Janett Billow with [facility]. Patient reports history of formal physical therapy at [facility] in [place]. He reports significant relief of pain with these treatments.  He receives his orthopedic care from Dr. Anderson Malta, MD in our office and was originally referred to [place] by him.  Patients lumbar MRI from 2022 exhibits multi-level facet hypertrophy, multi-level right lateral recess stenosis most severe at L3-L4 where there is moderate to severe spinal canal stenosis and right lateral recess narrowing. Patient has undergone multiple lumbar epidural steroid injections in our office with minimal relief, most recent injection was right L3 transforaminal epidural steroid injection on 10/02/2021. Patient uses cane to assist with ambulation and prevent falls. 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 Patient denies focal weakness, numbness and tingling. Patient denies recent trauma or falls.    Review of Systems  Musculoskeletal:  Positive for back pain and myalgias.  Neurological:  Negative for tingling, sensory change, focal weakness and weakness.  All other systems reviewed and  are negative.  Otherwise per HPI.  Assessment & Plan: Visit Diagnoses:    ICD-10-CM   1. Acute left-sided thoracic back pain  M54.6     2. Myofascial pain syndrome  M79.18     3. Chronic pain syndrome  G89.4        Plan: Findings:  Acute on chronic left sided thoracic back pain. Patient continues to have pain despite good conservative therapies such as formal physical therapy, rest and use of medications. Patients clinical presentation and exam are consistent with myofascial pain syndrome. Tenderness and multiple palpable trigger points noted to left thoracic paraspinal region upon exam today. Next step is to have patient re-group with physical therapy, he would like to go back to [facility] in [place]. I did provide written order for manual treatments and dry needling. I instructed patient to take documentation to facility. Patient did inquire about possible massage therapy, I do feel he could also benefit from consistent deep tissue massage. Patient encouraged to remain active. No red flag symptoms noted upon exam today.     Meds & Orders: No orders of the defined types were placed in this encounter.  No orders of the defined types were placed in this encounter.   Follow-up: Return if symptoms worsen or fail to improve.   Procedures: No procedures performed      Clinical History: STUDY: MRI of the lumbar spine without intravenous contrast performed on 05/13/2021 5:36 PM.   COMPARISON: No comparison available.   TECHNIQUE: Multiplanar, multisequence MR imaging obtained through the lumbar spine without contrast  on 05/13/2021 5:36 PM.   CONTRAST: None.   FINDINGS:  #  Osseous structures: Anterolateral osteophytes noted at multiple levels. There is a Schmorl's nodes in the superior endplates of the O99 and L5 vertebral bodies. Modic type II changes seen in superior plate of the Q06 vertebral body. No evidence of an acute fracture.  #  Alignment:There is a sigmoid shape to the  thoracolumbar spine. Grade 1 retrolisthesis at L2-L3.  #  Conus medullaris/cauda equina: Spinal cord signal is normal and terminates at L1-L2. The cauda equina is unremarkable.   #  Lower thoracic spine: Disc desiccation with a small posterior central disc protrusion effacing the ventral thecal sac at T11-T12. Neuroforamen are patent. This is viewed on the sagittal images only.   #  T12-L1: Disc desiccation without a significant disc herniation, spinal canal, or neuroforaminal compromise. This is viewed on the sagittal images only.  #  L1-L2: Disc desiccation with moderate loss of disc height and a disc bulge. Superimposed central disc extrusion migrating caudally appreciated. Mild facet hypertrophy. This results in moderate spinal canal and mild lateral recess and mild neuroforaminal stenosis.  #  L2-L3: Disc desiccation with moderate loss of disc height and a disc bulge. Posterior lateral osteophyte on the LEFT. Bilateral facet hypertrophy. This produces moderate spinal canal/lateral recess and mild neuroforaminal stenosis.  #  L3-L4: Disc desiccation with moderate loss of disc height and a disc bulge. Superimposed central disc protrusion noted. Posterior marginal osteophytes appreciated. Bilateral facet hypertrophy with ligamentum flavum redundancy on the LEFT. This is producing moderate to severe spinal canal/RIGHT lateral recess narrowing. Moderate LEFT lateral recess narrowing. Mild bilateral neuroforaminal stenosis.  #  L4-L5: Disc desiccation with mild loss of disc height and a disc bulge. Posterior marginal osteophytes appreciated. Bilateral facet hypertrophy and ligamentum flavum redundancy. This results in moderate spinal canal/lateral recess narrowing. Moderate to severe RIGHT and mild LEFT neuroforaminal stenosis.  #  L5-S1: Disc desiccation with severe loss of disc height and a disc bulge. Posterior marginal osteophytes appreciated. Bilateral facet hypertrophy that is greater on the RIGHT.  Spinal canal is patent. Moderate bilateral neuroforaminal stenosis.   #  Paraspinal tissues: Unremarkable.   #  Additional comments: None.    IMPRESSION:  1.  Multilevel degenerative disc, facet arthrosis and a sigmoid shape curvature to the thoracolumbar spine, as described above. This is most notable for moderate to severe spinal canal/RIGHT lateral recess narrowing and moderate LEFT lateral recess narrowing with mild bilateral neuroforaminal stenosis at L3-L4.  2.  Moderate spinal canal/lateral recess narrowing and moderate to severe RIGHT and mild LEFT neuroforaminal stenosis at L4-L5.  3.  Moderate spinal canal/lateral recess narrowing and mild neuroforaminal stenosis at L2-L3.  4.  Moderate spinal canal and mild lateral recess narrowing and mild bilateral neuroforaminal stenosis at L1-L2.  5.  Moderate bilateral neuroforaminal stenosis at L5-S1.   Electronically Signed by: [name] on 05/14/2021 1:50 PM   He reports that he has been smoking cigarettes. He has a 55.00 pack-year smoking history. He has been exposed to tobacco smoke. He has never used smokeless tobacco.  Recent Labs    10/22/21 1338  HGBA1C 8.8*    Objective:  VS:  HT:    WT:   BMI:     BP:121/72  HR:83bpm  TEMP: ( )  RESP:  Physical Exam Vitals and nursing note reviewed.  HENT:     Head: Normocephalic and atraumatic.     Right Ear: External ear normal.  Left Ear: External ear normal.     Nose: Nose normal.     Mouth/Throat:     Mouth: Mucous membranes are moist.  Eyes:     Extraocular Movements: Extraocular movements intact.  Cardiovascular:     Rate and Rhythm: Normal rate.     Pulses: Normal pulses.  Pulmonary:     Effort: Pulmonary effort is normal.  Abdominal:     General: Abdomen is flat. There is no distension.  Musculoskeletal:        General: Tenderness present.     Cervical back: Normal range of motion.     Comments: Pt is slow to rise from seated position to standing. Good lumbar  range of motion. Strong distal strength without clonus, no pain upon palpation of greater trochanters. Sensation intact bilaterally. Tenderness and multiple palpable trigger points noted to left thoracic paraspinal region. Ambulates with cane, gait slow and unsteady.   Skin:    General: Skin is warm and dry.     Capillary Refill: Capillary refill takes less than 2 seconds.  Neurological:     Mental Status: He is alert and oriented to person, place, and time.     Gait: Gait abnormal.  Psychiatric:        Mood and Affect: Mood normal.        Behavior: Behavior normal.     Ortho Exam  Imaging: No results found.  Past Medical/Family/Surgical/Social History: Medications & Allergies reviewed per EMR, new medications updated. Patient Active Problem List   Diagnosis Date Noted   Pericardial effusion 12/24/2021   Pleural effusion 11/04/2021   Pulmonary edema 11/04/2021   Incidental pulmonary nodule, > 22m and < 85m02/28/2023   Spinal stenosis at L4-L5 level 10/01/2021   Peripheral arterial disease (HCC[place]01/25/2023   Type 2 diabetes mellitus with diabetic neuropathy, unspecified (HCQuit[place]01/20/2023   Sensorineural hearing loss, bilateral 09/26/2021   Personal history of methicillin resistant Staphylococcus aureus 09/26/2021   Degeneration of lumbar or lumbosacral intervertebral disc 09/26/2021   Tobacco dependence 09/26/2021   Excessive daytime sleepiness 11/13/2020   Narcotic habituation, continuous (HCGreenup03/05/2021   At risk for central sleep apnea 11/13/2020   History of claustrophobia 11/13/2020   Obesity (BMI 30.0-34.9) 11/13/2020   Nocturia 11/13/2020   PTSD (post-traumatic stress disorder) 11/13/2020   Cognitive and behavioral changes 12/28/2019   Essential hypertension    Acute metabolic encephalopathy    Coronary artery disease 02/11/2018   Claudication in peripheral vascular disease (HCBrookville06/03/2018   Atypical chest pain 01/12/2018   Coronary artery calcification  01/07/2018   CKD (chronic kidney disease) stage 3, GFR 30-59 ml/min (HCC) 11/25/2017   Chronic diastolic heart failure (HCRedfield09/02/2017   Aortic stenosis, mild 05/13/2017   Low back pain 05/12/2017   GERD (gastroesophageal reflux disease) 05/12/2017   Prostate nodule 05/12/2017   Depression 05/12/2017   Hx of colonic polyps 05/12/2017   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/2017   Hyperlipidemia 05/12/2017   Hypertensive heart disease with heart failure (HCOld Harbor09/01/2017   Osteoarthritis of left knee 05/12/2017   Sleep apnea 05/12/2017   Actinic keratosis 05/12/2017   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   Seborrheic dermatitis 05/12/2017   Paresthesia 05/12/2017   Past Medical History:  Diagnosis Date   Actinic keratosis 05/12/2017   Back pain    Depression 05/12/2017   Diabetes (HCHenryville   GERD (gastroesophageal reflux disease) 05/12/2017   Hx of colonic polyps 05/12/2017   Hyperlipidemia 05/12/2017   Hypertension  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/2017   Low back pain 05/12/2017   Lumbar radiculopathy 05/12/2017   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   Obesity 05/12/2017   Osteoarthritis of knee 05/12/2017   Paresthesia 05/12/2017   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   Prostate nodule 05/12/2017   Right rotator cuff tear 10/01/2021   Seborrheic dermatitis 05/12/2017   Family History  Problem Relation Age of Onset   Hypertension Mother    Hypertension Father    Alzheimer's disease Father    Heart disease Paternal Aunt    Past Surgical History:  Procedure Laterality Date   HAND SURGERY Right    ORIF ANKLE FRACTURE Left 08/15/2007   Distal fibular   REPLACEMENT TOTAL KNEE Right 07/12/2012   RIGHT/LEFT HEART CATH AND CORONARY ANGIOGRAPHY N/A 01/12/2018   Procedure: RIGHT/LEFT HEART CATH AND CORONARY ANGIOGRAPHY;  Surgeon: Belva Crome, MD;  Location: [facility];  Service: Cardiovascular;  Laterality: N/A;   SHOULDER SURGERY Right    UMBILICAL HERNIA REPAIR  06/23/2013    Social History   Occupational History   Occupation: retired Nature conservation officer  Tobacco Use   Smoking status: Every Day    Packs/day: 1.00    Years: 55.00    Total pack years: 55.00    Types: Cigarettes    Passive exposure: Current   Smokeless tobacco: Never   Tobacco comments:    Has cut back  Vaping Use   Vaping Use: Never used  Substance and Sexual Activity   Alcohol use: No   Drug use: No    Comment: takes prescriptions as prescribed: oxycodone and methadone   Sexual activity: Never    Birth control/protection: None

## 2022-04-20 DIAGNOSIS — M545 Low back pain, unspecified: Secondary | ICD-10-CM | POA: Diagnosis not present

## 2022-04-21 ENCOUNTER — Ambulatory Visit: Payer: Medicare Other | Admitting: Family Medicine

## 2022-04-24 DIAGNOSIS — M545 Low back pain, unspecified: Secondary | ICD-10-CM | POA: Diagnosis not present

## 2022-04-27 ENCOUNTER — Encounter: Payer: Self-pay | Admitting: Orthopedic Surgery

## 2022-04-27 ENCOUNTER — Ambulatory Visit (INDEPENDENT_AMBULATORY_CARE_PROVIDER_SITE_OTHER): Payer: Medicare Other | Admitting: Orthopedic Surgery

## 2022-04-27 ENCOUNTER — Ambulatory Visit (INDEPENDENT_AMBULATORY_CARE_PROVIDER_SITE_OTHER): Payer: Medicare Other

## 2022-04-27 DIAGNOSIS — I25119 Atherosclerotic heart disease of native coronary artery with unspecified angina pectoris: Secondary | ICD-10-CM

## 2022-04-27 DIAGNOSIS — M25562 Pain in left knee: Secondary | ICD-10-CM

## 2022-04-27 DIAGNOSIS — M1712 Unilateral primary osteoarthritis, left knee: Secondary | ICD-10-CM

## 2022-04-27 MED ORDER — BUPIVACAINE HCL 0.25 % IJ SOLN
4.0000 mL | INTRAMUSCULAR | Status: AC | PRN
  Administered 2022-04-27: 4 mL via INTRA_ARTICULAR

## 2022-04-27 MED ORDER — LIDOCAINE HCL 1 % IJ SOLN
5.0000 mL | INTRAMUSCULAR | Status: AC | PRN
  Administered 2022-04-27: 5 mL

## 2022-04-27 MED ORDER — METHYLPREDNISOLONE ACETATE 40 MG/ML IJ SUSP
40.0000 mg | INTRAMUSCULAR | Status: AC | PRN
  Administered 2022-04-27: 40 mg via INTRA_ARTICULAR

## 2022-04-27 NOTE — Progress Notes (Signed)
Office Visit Note   Patient: Thomas [name]           Date of Birth: Apr 05, 1946           MRN: [id number] Visit Date: 04/27/2022 Requested by: Haydee Salter, MD [place],  [place] [id number] PCP: Haydee Salter, MD  Subjective: Chief Complaint  Patient presents with   Left Knee - Pain    HPI: Thomas [name] is a 76 year old patient with left knee pain.  Is having a lot of pain with ambulation.  Reports pain in the knee region as well as pain which radiates down into his toes.  Reports some degree of back pain as well.  Takes oxycodone from the [place] for pain management.  Denies any mechanical symptoms in the left knee.  Denies any giving way.  Localizes pain to the medial aspect of the knee.              ROS: All systems reviewed are negative as they relate to the chief complaint within the history of present illness.  Patient denies  fevers or chills.   Assessment & Plan: Visit Diagnoses:  1. Left knee pain, unspecified chronicity     Plan: Impression is left knee pain with arthritis on plain radiographs and potentially component of radiculopathy as well.  He has had some injections in his back for right-sided radiculopathy in the past but they have not helped much.  Symptoms now on the left-hand side are becoming untenable.  He does report pain radiating into his toes.  Plan today is diagnostic and therapeutic left knee injection.  He states that the [place] is thinking about replacing his knee.  He does take chronic pain medicine.  If he has a great result from this injection the knee replacement may be indicated.  If pain relief is marginal and his radicular symptoms predominate then reconsideration of further back work-up and injections is indicated  Follow-Up Instructions: Return if symptoms worsen or fail to improve.   Orders:  Orders Placed This Encounter  Procedures   XR KNEE 3 VIEW LEFT   No orders of the defined types were placed in this encounter.      Procedures: Large Joint Inj: L knee on 04/27/2022 11:16 AM Indications: diagnostic evaluation, joint swelling and pain Details: 18 G 1.5 in needle, superolateral approach  Arthrogram: No  Medications: 5 mL lidocaine 1 %; 40 mg methylPREDNISolone acetate 40 MG/ML; 4 mL bupivacaine 0.25 % Outcome: tolerated well, no immediate complications Procedure, treatment alternatives, risks and benefits explained, specific risks discussed. Consent was given by the patient. Immediately prior to procedure a time out was called to verify the correct patient, procedure, equipment, support staff and site/side marked as required. Patient was prepped and draped in the usual sterile fashion.       Clinical Data: No additional findings.  Objective: Vital Signs: There were no vitals taken for this visit.  Physical Exam:   Constitutional: Patient appears well-developed HEENT:  Head: Normocephalic Eyes:EOM are normal Neck: Normal range of motion Cardiovascular: Normal rate Pulmonary/chest: Effort normal Neurologic: Patient is alert Skin: Skin is warm Psychiatric: Patient has normal mood and affect   Ortho Exam: Ortho exam demonstrates mild pitting edema bilateral lower extremities.  Neck dorsiflexion plantarflexion intact.  No nerve root tension signs.  He has excellent range of motion of the left knee from 0-1 20 flexion.  Collateral and cruciate ligaments are stable.  Does have anterior medial joint line tenderness.  No groin pain on the left with internal/external rotation of the leg.  Extensor mechanism nontender and intact  Specialty Comments:  STUDY: MRI of the lumbar spine without intravenous contrast performed on 05/13/2021 5:36 PM.   COMPARISON: No comparison available.   TECHNIQUE: Multiplanar, multisequence MR imaging obtained through the lumbar spine without contrast on 05/13/2021 5:36 PM.   CONTRAST: None.   FINDINGS:  #  Osseous structures: Anterolateral osteophytes noted at multiple  levels. There is a Schmorl's nodes in the superior endplates of the V42 and L5 vertebral bodies. Modic type II changes seen in superior plate of the V95 vertebral body. No evidence of an acute fracture.  #  Alignment:There is a sigmoid shape to the thoracolumbar spine. Grade 1 retrolisthesis at L2-L3.  #  Conus medullaris/cauda equina: Spinal cord signal is normal and terminates at L1-L2. The cauda equina is unremarkable.   #  Lower thoracic spine: Disc desiccation with a small posterior central disc protrusion effacing the ventral thecal sac at T11-T12. Neuroforamen are patent. This is viewed on the sagittal images only.   #  T12-L1: Disc desiccation without a significant disc herniation, spinal canal, or neuroforaminal compromise. This is viewed on the sagittal images only.  #  L1-L2: Disc desiccation with moderate loss of disc height and a disc bulge. Superimposed central disc extrusion migrating caudally appreciated. Mild facet hypertrophy. This results in moderate spinal canal and mild lateral recess and mild neuroforaminal stenosis.  #  L2-L3: Disc desiccation with moderate loss of disc height and a disc bulge. Posterior lateral osteophyte on the LEFT. Bilateral facet hypertrophy. This produces moderate spinal canal/lateral recess and mild neuroforaminal stenosis.  #  L3-L4: Disc desiccation with moderate loss of disc height and a disc bulge. Superimposed central disc protrusion noted. Posterior marginal osteophytes appreciated. Bilateral facet hypertrophy with ligamentum flavum redundancy on the LEFT. This is producing moderate to severe spinal canal/RIGHT lateral recess narrowing. Moderate LEFT lateral recess narrowing. Mild bilateral neuroforaminal stenosis.  #  L4-L5: Disc desiccation with mild loss of disc height and a disc bulge. Posterior marginal osteophytes appreciated. Bilateral facet hypertrophy and ligamentum flavum redundancy. This results in moderate spinal canal/lateral recess  narrowing. Moderate to severe RIGHT and mild LEFT neuroforaminal stenosis.  #  L5-S1: Disc desiccation with severe loss of disc height and a disc bulge. Posterior marginal osteophytes appreciated. Bilateral facet hypertrophy that is greater on the RIGHT. Spinal canal is patent. Moderate bilateral neuroforaminal stenosis.   #  Paraspinal tissues: Unremarkable.   #  Additional comments: None.    IMPRESSION:  1.  Multilevel degenerative disc, facet arthrosis and a sigmoid shape curvature to the thoracolumbar spine, as described above. This is most notable for moderate to severe spinal canal/RIGHT lateral recess narrowing and moderate LEFT lateral recess narrowing with mild bilateral neuroforaminal stenosis at L3-L4.  2.  Moderate spinal canal/lateral recess narrowing and moderate to severe RIGHT and mild LEFT neuroforaminal stenosis at L4-L5.  3.  Moderate spinal canal/lateral recess narrowing and mild neuroforaminal stenosis at L2-L3.  4.  Moderate spinal canal and mild lateral recess narrowing and mild bilateral neuroforaminal stenosis at L1-L2.  5.  Moderate bilateral neuroforaminal stenosis at L5-S1.   Electronically Signed by: [name] on 05/14/2021 1:50 PM  Imaging: XR KNEE 3 VIEW LEFT  Result Date: 04/27/2022 AP lateral radiographs left knee reviewed.  End-stage arthritis present in the medial greater than patellofemoral compartment.  Compartment also affected but to a lesser degree.  No acute fracture.  Slight varus alignment.  Patella height normal    PMFS History: Patient Active Problem List   Diagnosis Date Noted   Pericardial effusion 12/24/2021   Pleural effusion 11/04/2021   Pulmonary edema 11/04/2021   Incidental pulmonary nodule, > 2m and < 872m02/28/2023   Spinal stenosis at L4-L5 level 10/01/2021   Peripheral arterial disease (HCBethel01/25/2023   Type 2 diabetes mellitus with diabetic neuropathy, unspecified (HCLawrence01/20/2023   Sensorineural hearing loss, bilateral  09/26/2021   Personal history of methicillin resistant Staphylococcus aureus 09/26/2021   Degeneration of lumbar or lumbosacral intervertebral disc 09/26/2021   Tobacco dependence 09/26/2021   Excessive daytime sleepiness 11/13/2020   Narcotic habituation, continuous (HCRoff03/05/2021   At risk for central sleep apnea 11/13/2020   History of claustrophobia 11/13/2020   Obesity (BMI 30.0-34.9) 11/13/2020   Nocturia 11/13/2020   PTSD (post-traumatic stress disorder) 11/13/2020   Cognitive and behavioral changes 12/28/2019   Essential hypertension    Acute metabolic encephalopathy    Coronary artery disease 02/11/2018   Claudication in peripheral vascular disease (HCPasatiempo06/03/2018   Atypical chest pain 01/12/2018   Coronary artery calcification 01/07/2018   CKD (chronic kidney disease) stage 3, GFR 30-59 ml/min (HCC) 11/25/2017   Chronic diastolic heart failure (HCStollings09/02/2017   Aortic stenosis, mild 05/13/2017   Low back pain 05/12/2017   GERD (gastroesophageal reflux disease) 05/12/2017   Prostate nodule 05/12/2017   Depression 05/12/2017   Hx of colonic polyps 05/12/2017   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/2017   Hyperlipidemia 05/12/2017   Hypertensive heart disease with heart failure (HCFelton09/01/2017   Osteoarthritis of left knee 05/12/2017   Sleep apnea 05/12/2017   Actinic keratosis 05/12/2017   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   Seborrheic dermatitis 05/12/2017   Paresthesia 05/12/2017   Past Medical History:  Diagnosis Date   Actinic keratosis 05/12/2017   Back pain    Depression 05/12/2017   Diabetes (HCColusa   GERD (gastroesophageal reflux disease) 05/12/2017   Hx of colonic polyps 05/12/2017   Hyperlipidemia 05/12/2017   Hypertension    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/2017   Low back pain 05/12/2017   Lumbar radiculopathy 05/12/2017   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   Obesity 05/12/2017   Osteoarthritis of knee 05/12/2017   Paresthesia 05/12/2017   Peripheral  sensory neuropathy 05/12/2017   Prostate nodule 05/12/2017   Right rotator cuff tear 10/01/2021   Seborrheic dermatitis 05/12/2017    Family History  Problem Relation Age of Onset   Hypertension Mother    Hypertension Father    Alzheimer's disease Father    Heart disease Paternal Aunt     Past Surgical History:  Procedure Laterality Date   HAND SURGERY Right    ORIF ANKLE FRACTURE Left 08/15/2007   Distal fibular   REPLACEMENT TOTAL KNEE Right 07/12/2012   RIGHT/LEFT HEART CATH AND CORONARY ANGIOGRAPHY N/A 01/12/2018   Procedure: RIGHT/LEFT HEART CATH AND CORONARY ANGIOGRAPHY;  Surgeon: SmBelva CromeMD;  [facility];  Service: Cardiovascular;  Laterality: N/A;   SHOULDER SURGERY Right    UMBILICAL HERNIA REPAIR  06/23/2013   Social History   Occupational History   Occupation: retired miNature conservation officerTobacco Use   Smoking status: Every Day    Packs/day: 1.00    Years: 55.00    Total pack years: 55.00    Types: Cigarettes    Passive exposure: Current   Smokeless tobacco: Never   Tobacco comments:    Has cut back  Vaping Use   Vaping Use: Never used  Substance and Sexual Activity   Alcohol use: No   Drug use: No    Comment: takes prescriptions as prescribed: oxycodone and methadone   Sexual activity: Never    Birth control/protection: None

## 2022-04-28 ENCOUNTER — Ambulatory Visit (INDEPENDENT_AMBULATORY_CARE_PROVIDER_SITE_OTHER): Payer: Medicare Other | Admitting: Family Medicine

## 2022-04-28 ENCOUNTER — Encounter: Payer: Self-pay | Admitting: Family Medicine

## 2022-04-28 VITALS — BP 132/80 | HR 80 | Temp 97.4°F | Ht 71.0 in | Wt 213.0 lb

## 2022-04-28 DIAGNOSIS — N183 Chronic kidney disease, stage 3 unspecified: Secondary | ICD-10-CM | POA: Diagnosis not present

## 2022-04-28 DIAGNOSIS — I5032 Chronic diastolic (congestive) heart failure: Secondary | ICD-10-CM

## 2022-04-28 DIAGNOSIS — I10 Essential (primary) hypertension: Secondary | ICD-10-CM | POA: Diagnosis not present

## 2022-04-28 DIAGNOSIS — R1013 Epigastric pain: Secondary | ICD-10-CM

## 2022-04-28 DIAGNOSIS — I25119 Atherosclerotic heart disease of native coronary artery with unspecified angina pectoris: Secondary | ICD-10-CM | POA: Diagnosis not present

## 2022-04-28 DIAGNOSIS — G608 Other hereditary and idiopathic neuropathies: Secondary | ICD-10-CM

## 2022-04-28 DIAGNOSIS — E1140 Type 2 diabetes mellitus with diabetic neuropathy, unspecified: Secondary | ICD-10-CM

## 2022-04-28 DIAGNOSIS — E782 Mixed hyperlipidemia: Secondary | ICD-10-CM

## 2022-04-28 DIAGNOSIS — R911 Solitary pulmonary nodule: Secondary | ICD-10-CM

## 2022-04-28 LAB — CBC
HCT: 42.3 % (ref 39.0–52.0)
Hemoglobin: 14.3 g/dL (ref 13.0–17.0)
MCHC: 33.8 g/dL (ref 30.0–36.0)
MCV: 93.6 fl (ref 78.0–100.0)
Platelets: 302 10*3/uL (ref 150.0–400.0)
RBC: 4.52 Mil/uL (ref 4.22–5.81)
RDW: 13.3 % (ref 11.5–15.5)
WBC: 13.2 10*3/uL — ABNORMAL HIGH (ref 4.0–10.5)

## 2022-04-28 LAB — LIPID PANEL
Cholesterol: 234 mg/dL — ABNORMAL HIGH (ref 0–200)
HDL: 37.8 mg/dL — ABNORMAL LOW (ref >39.00)
LDL Cholesterol: 161 mg/dL — ABNORMAL HIGH (ref 0–99)
NonHDL: 196.6
Total CHOL/HDL Ratio: 6
Triglycerides: 180 mg/dL — ABNORMAL HIGH (ref 0.0–149.0)
VLDL: 36 mg/dL (ref 0.0–40.0)

## 2022-04-28 LAB — COMPREHENSIVE METABOLIC PANEL
ALT: 17 U/L (ref 0–53)
AST: 19 U/L (ref 0–37)
Albumin: 4.4 g/dL (ref 3.5–5.2)
Alkaline Phosphatase: 88 U/L (ref 39–117)
BUN: 13 mg/dL (ref 6–23)
CO2: 29 mEq/L (ref 19–32)
Calcium: 9.5 mg/dL (ref 8.4–10.5)
Chloride: 99 mEq/L (ref 96–112)
Creatinine, Ser: 1.04 mg/dL (ref 0.40–1.50)
GFR: 69.16 mL/min (ref >60.00)
Glucose, Bld: 98 mg/dL (ref 70–99)
Potassium: 4.1 mEq/L (ref 3.5–5.1)
Sodium: 138 mEq/L (ref 135–145)
Total Bilirubin: 0.8 mg/dL (ref 0.2–1.2)
Total Protein: 7.6 g/dL (ref 6.0–8.3)

## 2022-04-28 LAB — HEMOGLOBIN A1C: Hgb A1c MFr Bld: 7 % — ABNORMAL HIGH (ref 4.6–6.5)

## 2022-04-28 NOTE — Progress Notes (Signed)
[facility] [address] [place] [address] Dept: [phone] Dept Fax: [phone]  Chronic Care Office Visit  Subjective:    Patient ID: Thomas [name], male    DOB: 1945/10/12, 76 y.o..   MRN: [id number]  Chief Complaint  Patient presents with   Follow-up    3 month f/u.  Still c/o having chest pains off/on for months. (? Meds)     History of Present Illness:  Patient is in today for reassessment of chronic medical issues.  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 He has been seeing Dr. Bettina Gavia (cardiology). He has had evidence of being fluid overloaded and having a pericardial effusion and pleural effusions. He is currently managed on Entresto 97-103 bid, amlodipine 10 mg daily, a daily aspirin, empagliflozin 10 mg daily and Lasix 40 mg daily. He also has hyperlipidemia and is on atorvastatin 80 mg daily.  Thomas [name] continues to complain of "chest pain" though he indicates more of an epigastric and LUQ pain. Thomas [name] is a bit of a difficult historian, not clearly describing the quality, intensity, or frequency of the pain. He is not happy with Dr. Joya [name] care and insistent that he needs to see a new cardiologist. Apparently, he and Dr. Bettina Gavia also had an argument about pain medications, which Thomas [name] denies asking Dr. Bettina Gavia for, as he already gets oxycodone from the [place].   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 He also has knee arthritis. He had a prior right total knee joint replacement, with some residual issues with his knee. He had a left knee joint injection yesterday and states he slept much better last night.   Thomas [name] has a history of Type 2 diabetes. His last A1c from the [place] on 8/15 was 6.7%. He notes his [place] doctor has stopped his metformin. He remains on glipizide 10 mg  bid and empagliflozin (Jardiance) 10 mg daily.    Thomas [name] has a history of an incidental pulmonary nodule. He was due for a follow up CT scan in 3-6 months, which is due now.   Past Medical History: Patient Active Problem List   Diagnosis Date Noted   Pericardial effusion 12/24/2021   Pleural effusion 11/04/2021   Pulmonary edema 11/04/2021   Incidental pulmonary nodule, > 14m and < 868m02/28/2023   Spinal stenosis at L4-L5 level 10/01/2021   Peripheral arterial disease (HCFarmville01/25/2023   Type 2 diabetes mellitus with diabetic neuropathy, unspecified (HCEdmunds01/20/2023   Sensorineural hearing loss, bilateral 09/26/2021   Personal history of methicillin resistant Staphylococcus aureus 09/26/2021   Degeneration of lumbar or lumbosacral intervertebral disc 09/26/2021   Tobacco dependence 09/26/2021   Excessive daytime sleepiness 11/13/2020   Narcotic habituation, continuous (HCLima03/05/2021   At risk for central sleep apnea 11/13/2020   History of claustrophobia 11/13/2020   Obesity (BMI 30.0-34.9) 11/13/2020   Nocturia 11/13/2020   PTSD (post-traumatic stress disorder) 11/13/2020   Cognitive and behavioral changes 12/28/2019   Essential hypertension    Acute metabolic encephalopathy    Coronary artery disease 02/11/2018   Claudication in peripheral vascular disease (HCReinholds06/03/2018   Atypical chest pain 01/12/2018   Coronary artery calcification 01/07/2018   CKD (chronic kidney disease) stage 3, GFR 30-59 ml/min (HCC) 11/25/2017   Chronic diastolic heart failure (HCBillington Heights09/02/2017   Aortic stenosis,  mild 05/13/2017   Low back pain 05/12/2017   GERD (gastroesophageal reflux disease) 05/12/2017   Prostate nodule 05/12/2017   Depression 05/12/2017   Hx of colonic polyps 05/12/2017   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/2017   Hyperlipidemia 05/12/2017   Hypertensive heart disease with heart failure ([place]) 05/12/2017   Osteoarthritis of left knee 05/12/2017   Sleep apnea 05/12/2017   Actinic  keratosis 05/12/2017   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   Seborrheic dermatitis 05/12/2017   Paresthesia 05/12/2017   Past Surgical History:  Procedure Laterality Date   HAND SURGERY Right    ORIF ANKLE FRACTURE Left 08/15/2007   Distal fibular   REPLACEMENT TOTAL KNEE Right 07/12/2012   RIGHT/LEFT HEART CATH AND CORONARY ANGIOGRAPHY N/A 01/12/2018   Procedure: RIGHT/LEFT HEART CATH AND CORONARY ANGIOGRAPHY;  Surgeon: Belva Crome, MD;  Location: [facility];  Service: Cardiovascular;  Laterality: N/A;   SHOULDER SURGERY Right    UMBILICAL HERNIA REPAIR  06/23/2013   Family History  Problem Relation Age of Onset   Hypertension Mother    Hypertension Father    Alzheimer's disease Father    Heart disease Paternal Aunt    Outpatient Medications Prior to Visit  Medication Sig Dispense Refill   amLODipine (NORVASC) 10 MG tablet Take 10 mg by mouth in the morning. Heart Medication     aspirin 81 MG chewable tablet Chew 81 mg by mouth daily at 12 noon. Heart medication     atorvastatin (LIPITOR) 80 MG tablet Take 40 mg by mouth at bedtime. Heart medication     Calcium Carbonate-Vitamin D 600-400 MG-UNIT tablet Take 1 tablet by mouth 2 (two) times daily.     cetirizine (ZYRTEC) 10 MG tablet Take 10 mg by mouth daily as needed for allergies.     colchicine 0.6 MG tablet Take 1 tablet (0.6 mg total) by mouth 2 (two) times daily. 60 tablet 2   diclofenac Sodium (VOLTAREN) 1 % GEL Apply 1 application topically 4 (four) times daily as needed (pain).     DULoxetine (CYMBALTA) 60 MG capsule Take 60 mg by mouth every evening.      empagliflozin (JARDIANCE) 10 MG TABS tablet Take 1 tablet (10 mg total) by mouth daily before breakfast. 30 tablet 11   EPINEPHrine 0.3 mg/0.3 mL IJ SOAJ injection Inject 0.3 mg into the muscle daily as needed (for anaphylaxis).      furosemide (LASIX) 40 MG tablet Take 1 tablet (40 mg total) by mouth daily. 90 tablet 3    glipiZIDE (GLUCOTROL) 10 MG tablet Take 10 mg by mouth in the morning and at bedtime.     Ibuprofen 200 MG CAPS Take 3 capsules (600 mg total) by mouth every 8 (eight) hours. X 2 weeks. 120 capsule 0   meloxicam (MOBIC) 15 MG tablet Take 7.5 mg by mouth in the morning and at bedtime.     methadone (DOLOPHINE) 5 MG tablet Take 5 mg by mouth every other day. For severe chronic pain     omeprazole (PRILOSEC) 40 MG capsule Take 40 mg by mouth daily before breakfast.     oxyCODONE (OXY IR/ROXICODONE) 5 MG immediate release tablet Take 5 mg by mouth 4 (four) times daily as needed (severe chronic pain).      sacubitril-valsartan (ENTRESTO) 97-103 MG Take 1 tablet by mouth 2 (two) times daily. 180 tablet 3   tamsulosin (FLOMAX) 0.4 MG CAPS capsule TAKE ONE CAPSULE BY MOUTH EVERY NIGHT FOR URINATION/PROSTATE *  REPLACES TERAZOSIN *     tiZANidine (ZANAFLEX) 4 MG tablet Take 4 mg by mouth at bedtime.     Vibegron 75 MG TABS Take 75 mg by mouth daily.     metFORMIN (GLUCOPHAGE) 1000 MG tablet Take 1,000 mg by mouth 2 (two) times daily with a meal.      No facility-administered medications prior to visit.   Allergies  Allergen Reactions   Wasp Venom Anaphylaxis, Itching and Swelling     Objective:   Today's Vitals   04/28/22 0941  BP: 132/80  Pulse: 80  Temp: (!) 97.4 F (36.3 C)  TempSrc: Temporal  SpO2: 98%  Weight: 213 lb (96.6 kg)  Height: _0  (1.803 m)   Body mass index is 29.71 kg/m.   General: Well developed, well nourished. No acute distress. Lungs: Moderately coarse respirations bilaterally. No wheezing, rales or rhonchi. CV: RRR without murmurs or rubs. Pulses 2+ bilaterally. Abdomen: Soft. Some tenderness on palpation over the epigastrium and towards the LUQ. Bowel   sounds positive, normal pitch and frequency. No hepatosplenomegaly. No rebound or guarding. Extremities: Full ROM. No joint swelling or tenderness. No edema noted. Feet- Skin intact, but shiny and thin appearing.  No sign of maceration between toes. Mild thickening and   friability of multiple nails. Dorsalis pedis and posterior tibial artery pulses are normal. 5.07 monofilament   testing abnormal with about 1/2 of the plantar surface insensate bilaterally.. Neuro: CN II-XII intact. Normal sensation and DTR bilaterally. Psych: Alert and oriented. Normal mood and affect.  Health Maintenance Due  Topic Date Due   FOOT EXAM  Never done   Hepatitis C Screening  Never done   INFLUENZA VACCINE  04/07/2022   HEMOGLOBIN A1C  04/21/2022   Imaging: MR Cardiac Morphology w wo contrast (02/05/2022) IMPRESSION: 1. Large pericardial effusion measuring up to 63m adjacent to RV free wall 2. Severe LVH measuring up to 266min basal septum (1743mn inferior wall). No evidence of amyloidosis. 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 Small amount of patchy LGE in basal septum and RV insertion sites. LGE accounts for <1% of total myocardial mass 4. Mild LV dilatation with moderate to severe systolic dysfunction (EF 30%58%.  Normal RV size and systolic function (EF 50%09%.  Dilated ascending aorta measuring 48m64mT Chest wo contrast (10/31/2021) IMPRESSION: 1. Ill-defined linear and slightly nodular opacities in the periphery of the right lower lobe, with nodular component measuring up to 7 mm. This is new from 2019 CT. Favor post infectious or inflammatory etiology, however recommend follow-up CT in 3-6 months to assess for stability or resolution. 2. Small to moderate right and small left layering pleural effusions with adjacent compressive atelectasis. Mild smooth septal thickening suggests mild pulmonary edema. 3. Cardiomegaly. Moderate circumferential pericardial effusion measuring up to 3 cm in thickness. Pericardial effusion was present on 2019 CT, however mildly increased in size since that time. 4. Aortic  atherosclerosis.  Coronary artery calcifications. 5. Dilated main pulmonary artery may represent pulmonary arterial hypertension.   Aortic Atherosclerosis (ICD10-I70.0) and Emphysema (ICD10-J43.9).   Assessment & Plan:   1. Chronic diastolic heart failure ([facility]Con[facility]. TrulStormontears to be fairly well compensated at this point. He had been found to have a large pericardial effusion, but he states this was never drained.d He did have his Entresto dose increased and this may have helped. However, he is adamant that he  does not want to continue to see Dr. Bettina Gavia, feeling he is not taking action about his complaints. There are definite challenges to his care, esp. with his blend of [facility] both [facility] and [facility]. I will go ahead and try to refer him to a new cardiologist for a 2nd opinion. Meanwhile, he should continue his Entresto 97-103 bid, amlodipine 10 mg daily, a daily aspirin, empagliflozin 10 mg daily and Lasix 40 mg daily.  - Ambulatory referral to Cardiology  2. Essential hypertension Blood pressure is in fairly good control currently. Continue amlodipine 10 mg daily and his other heart meds.  3. Coronary artery disease involving native coronary artery of native heart with angina pectoris ([place]) As above. No current sign of angina, as the "chest pain" appears to be more GI related.  - Ambulatory referral to Cardiology  4. Type 2 diabetes mellitus with diabetic neuropathy, without long-term current use of insulin [facility]) Thomas [name] diabetes has been in better control. Foot exam completed today. I will have him continue his glipizide 10 mg bid and empagliflozin (Jardiance) 10 mg daily.  We will want to monitor for any loss of control now that he has stopped his metformin.  - Hemoglobin A1c  5. Peripheral sensory neuropathy Stable.  6. Stage 3 chronic kidney disease, unspecified whether stage 3a or 3b CKD ([place]) Recent lab work from the [place] shows some decline in renal function  compared to last Spring. I will see where this is on today's labs.  7. Mixed hyperlipidemia Lipids are due to reassess adequacy of his current atorvastatin 80 mg daily.  - Lipid panel  8. Incidental pulmonary nodule, > 30m and < 817mPrior incidental pulmonary nodule. I will order a follow up CT scan.  - CT CHEST NODULE FOLLOW UP LOW DOSE W/O; Future  9. Epigastric pain The exam indicates mainly epigastric pain. Thomas [name] currently managed on omeprazole. He has stopped metformin, which could have contributed, but not seen any improvement. I will check some screening labs and order a CT of the abdomen to assess.  - CBC - Comprehensive metabolic panel - CT ABDOMEN W CONTRAST; Future  Return in about 3 months (around 07/29/2022) for Reassessment.   StHaydee SalterMD

## 2022-04-29 DIAGNOSIS — M545 Low back pain, unspecified: Secondary | ICD-10-CM | POA: Diagnosis not present

## 2022-05-04 DIAGNOSIS — M545 Low back pain, unspecified: Secondary | ICD-10-CM | POA: Diagnosis not present

## 2022-05-06 ENCOUNTER — Encounter: Payer: Self-pay | Admitting: Internal Medicine

## 2022-05-06 ENCOUNTER — Ambulatory Visit: Payer: Medicare Other | Admitting: Internal Medicine

## 2022-05-06 VITALS — BP 142/79 | HR 80 | Temp 98.0°F | Resp 16 | Ht 71.0 in | Wt 213.0 lb

## 2022-05-06 DIAGNOSIS — I359 Nonrheumatic aortic valve disorder, unspecified: Secondary | ICD-10-CM

## 2022-05-06 DIAGNOSIS — M545 Low back pain, unspecified: Secondary | ICD-10-CM | POA: Diagnosis not present

## 2022-05-06 DIAGNOSIS — I10 Essential (primary) hypertension: Secondary | ICD-10-CM

## 2022-05-06 DIAGNOSIS — I200 Unstable angina: Secondary | ICD-10-CM

## 2022-05-06 DIAGNOSIS — I5021 Acute systolic (congestive) heart failure: Secondary | ICD-10-CM

## 2022-05-06 DIAGNOSIS — I739 Peripheral vascular disease, unspecified: Secondary | ICD-10-CM

## 2022-05-06 DIAGNOSIS — I5022 Chronic systolic (congestive) heart failure: Secondary | ICD-10-CM

## 2022-05-06 DIAGNOSIS — I110 Hypertensive heart disease with heart failure: Secondary | ICD-10-CM

## 2022-05-06 DIAGNOSIS — R931 Abnormal findings on diagnostic imaging of heart and coronary circulation: Secondary | ICD-10-CM

## 2022-05-06 HISTORY — DX: Chronic systolic (congestive) heart failure: I50.22

## 2022-05-06 NOTE — Progress Notes (Signed)
Primary Physician/Referring:  Haydee Salter, MD  Patient ID: Thomas [name], male    DOB: 1946/04/15, 76 y.o.   MRN: [id number]  Chief Complaint  Patient presents with   Congestive Heart Failure   Coronary Artery Disease   New Patient (Initial Visit)    Referred by Dr. Arlester Marker   HPI:    [name]  is a 76 y.o. male with past medical history significant for hypertension, newly reduced ejection fraction, and unstable angina who is here to establish care with cardiology.  Patient states that he has been having chest pain all the time even at rest.  He states that the only time he does not have chest pain is when he is sleeping which is even difficult to do nowadays.  He also admits to severe claudication.  Reviewing his recent echocardiogram from June it shows newly reduced ejection fraction with regional wall motion abnormality.  Patient is on optimal medical therapy at this time.  Given his chest pain at rest and decreased ejection fraction we will go ahead with left and right heart catheterization rather than stress test.  He will also get a bilateral lower extremity arterial ultrasound due to significantly decreased pulses in both legs.  Patient is a smoker but he is currently trying to quit.  He has cut his smoking down to about half a pack a day at this point. Denies palpitations, diaphoresis, syncope, PND, orthopnea.   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   Obesity 05/12/2017   Osteoarthritis of knee 05/12/2017   Paresthesia 05/12/2017   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   Prostate nodule  05/12/2017   Right rotator cuff tear 10/01/2021   Seborrheic dermatitis 05/12/2017   Past Surgical History:  Procedure Laterality Date   HAND SURGERY Right    ORIF ANKLE FRACTURE Left 08/15/2007   Distal fibular   REPLACEMENT TOTAL KNEE Right 07/12/2012   RIGHT/LEFT HEART CATH AND CORONARY ANGIOGRAPHY N/A 01/12/2018   Procedure: RIGHT/LEFT HEART CATH AND CORONARY ANGIOGRAPHY;  Surgeon: Belva Crome, MD;  Location: [facility];  Service: Cardiovascular;  Laterality: N/A;   SHOULDER SURGERY Right    UMBILICAL HERNIA REPAIR  06/23/2013   Family History  Problem Relation Age of Onset   Hypertension Mother    Hypertension Father    Alzheimer's disease Father    Heart disease Sister    Heart disease Paternal Aunt     Social History   Tobacco Use   Smoking status: Every Day    Packs/day: 0.50    Years: 55.00    Total pack years: 27.50    Types: Cigarettes    Passive exposure: Current   Smokeless tobacco: Never   Tobacco comments:    Has cut back  Substance Use Topics   Alcohol use: No   Marital Status: Married  ROS  Review of Systems  Cardiovascular:  Positive for chest pain, claudication and dyspnea on exertion.  All other systems reviewed and are negative. Objective  Blood pressure (!) 142/79, pulse 80, temperature 98 F (36.7 C), temperature  source Temporal, resp. rate 16, height _0  (1.803 m), weight 213 lb (96.6 kg), SpO2 97 %. Body mass index is 29.71 kg/m.     05/06/2022   10:23 AM 04/28/2022    9:41 AM 04/14/2022    2:06 PM  Vitals with BMI  Height _1  _2    Weight 213 lbs 213 lbs   BMI 16.55 37.48   Systolic 270 786 754  Diastolic 79 80 72  Pulse 80 80 83     Physical Exam Vitals and nursing note reviewed.  Constitutional:      Appearance: Normal appearance.  HENT:     Head: Normocephalic and atraumatic.  Neck:     Vascular: No carotid bruit.  Cardiovascular:     Rate and Rhythm: Normal rate and regular rhythm.     Pulses:           Dorsalis pedis pulses are 0 on the right side and 0 on the left side.       Posterior tibial pulses are 1+ on the right side and 1+ on the left side.     Heart sounds: Murmur heard.     No gallop.  Pulmonary:     Effort: Pulmonary effort is normal.     Breath sounds: Normal breath sounds.  Abdominal:     General: Bowel sounds are normal.     Palpations: Abdomen is soft.  Musculoskeletal:     Right lower leg: No edema.     Left lower leg: No edema.  Skin:    General: Skin is warm and dry.  Neurological:     Mental Status: He is alert.   Medications and allergies   Allergies  Allergen Reactions   Wasp Venom Anaphylaxis, Itching and Swelling     Medication list after today's encounter   Current Outpatient Medications:    amLODipine (NORVASC) 10 MG tablet, Take 10 mg by mouth in the morning. Heart Medication, Disp: , Rfl:    aspirin 81 MG chewable tablet, Chew 81 mg by mouth daily at 12 noon. Heart medication, Disp: , Rfl:    atorvastatin (LIPITOR) 80 MG tablet, Take 40 mg by mouth at bedtime. Heart medication, Disp: , Rfl:    Calcium Carbonate-Vitamin D 600-400 MG-UNIT tablet, Take 1 tablet by mouth 2 (two) times daily., Disp: , Rfl:    cetirizine (ZYRTEC) 10 MG tablet, Take 10 mg by mouth daily as needed for allergies., Disp: , Rfl:    colchicine 0.6 MG tablet, Take 1 tablet (0.6 mg total) by mouth 2 (two) times daily., Disp: 60 tablet, Rfl: 2   diclofenac Sodium (VOLTAREN) 1 % GEL, Apply 1 application topically 4 (four) times daily as needed (pain)., Disp: , Rfl:    DULoxetine (CYMBALTA) 60 MG capsule, Take 60 mg by mouth every evening. , Disp: , Rfl:    empagliflozin (JARDIANCE) 10 MG TABS tablet, Take 1 tablet (10 mg total) by mouth daily before breakfast., Disp: 30 tablet, Rfl: 11   EPINEPHrine 0.3 mg/0.3 mL IJ SOAJ injection, Inject 0.3 mg into the muscle daily as needed (for anaphylaxis). , Disp: , Rfl:    furosemide (LASIX) 40 MG tablet, Take 1 tablet (40 mg total) by mouth  daily., Disp: 90 tablet, Rfl: 3   glipiZIDE (GLUCOTROL) 10 MG tablet, Take 10 mg by mouth in the morning and at bedtime., Disp: , Rfl:    Ibuprofen 200 MG CAPS, Take 3 capsules (600 mg total) by mouth every 8 (eight) hours. X  2 weeks., Disp: 120 capsule, Rfl: 0   meloxicam (MOBIC) 15 MG tablet, Take 7.5 mg by mouth in the morning and at bedtime., Disp: , Rfl:    methadone (DOLOPHINE) 5 MG tablet, Take 5 mg by mouth every other day. For severe chronic pain, Disp: , Rfl:    omeprazole (PRILOSEC) 40 MG capsule, Take 40 mg by mouth daily before breakfast., Disp: , Rfl:    oxyCODONE (OXY IR/ROXICODONE) 5 MG immediate release tablet, Take 5 mg by mouth 4 (four) times daily as needed (severe chronic pain). , Disp: , Rfl:    sacubitril-valsartan (ENTRESTO) 97-103 MG, Take 1 tablet by mouth 2 (two) times daily., Disp: 180 tablet, Rfl: 3   tamsulosin (FLOMAX) 0.4 MG CAPS capsule, TAKE ONE CAPSULE BY MOUTH EVERY NIGHT FOR URINATION/PROSTATE * REPLACES TERAZOSIN *, Disp: , Rfl:    tiZANidine (ZANAFLEX) 4 MG tablet, Take 4 mg by mouth at bedtime., Disp: , Rfl:    Vibegron 75 MG TABS, Take 75 mg by mouth daily., Disp: , Rfl:   Laboratory examination:   Lab Results  Component Value Date   NA 138 04/28/2022   K 4.1 04/28/2022   CO2 29 04/28/2022   GLUCOSE 98 04/28/2022   BUN 13 04/28/2022   CREATININE 1.04 04/28/2022   CALCIUM 9.5 04/28/2022   EGFR 40 (L) 03/09/2022   GFRNONAA 38 (L) 02/20/2022       Latest Ref Rng & Units 04/28/2022   10:41 AM 03/09/2022   11:03 AM 02/24/2022   11:50 AM  CMP  Glucose 70 - 99 mg/dL 98  194  91   BUN 6 - 23 mg/dL 13  25  35   Creatinine 0.40 - 1.50 mg/dL 1.04  1.74  1.54   Sodium 135 - 145 mEq/L 138  138  144   Potassium 3.5 - 5.1 mEq/L 4.1  4.4  4.7   Chloride 96 - 112 mEq/L 99  101  104   CO2 19 - 32 mEq/L _0 Calcium 8.4 - 10.5 mg/dL 9.5  9.4  9.5   Total Protein 6.0 - 8.3 g/dL 7.6     Total Bilirubin 0.2 - 1.2 mg/dL 0.8     Alkaline Phos 39 - 117  U/L 88     AST 0 - 37 U/L 19     ALT 0 - 53 U/L 17         Latest Ref Rng & Units 04/28/2022   10:41 AM 02/20/2022    3:48 PM 04/16/2021    1:34 PM  CBC  WBC 4.0 - 10.5 K/uL 13.2  8.5  8.5   Hemoglobin 13.0 - 17.0 g/dL 14.3  15.4  13.9   Hematocrit 39.0 - 52.0 % 42.3  44.3  40.8   Platelets 150.0 - 400.0 K/uL 302.0  297  277     Lipid Panel Recent Labs    04/28/22 1041  CHOL 234*  TRIG 180.0*  LDLCALC 161*  VLDL 36.0  HDL 37.80*  CHOLHDL 6    HEMOGLOBIN A1C Lab Results  Component Value Date   HGBA1C 7.0 (H) 04/28/2022   MPG 202.99 12/11/2019   TSH No results for input(s): "TSH" in the last 8760 hours.  External labs:     Radiology:    Cardiac Studies:   No results found for this or any previous visit from the past 1095 days.     No results found for this or any previous visit from  the past 1095 days.  Echo 02/2022 reviewed by me, agree with EF 45-50% however there is anterolateral wall motion abnormality. All of which is new.   EKG:   05/06/22:  Sinus Rhythm -occasional PAC. LAFB and LVH present. Possible lateral ischemia   Assessment     ICD-10-CM   1. Hypertensive heart disease with heart failure (HCC)  I11.0 EKG 12-Lead    PCV LOWER ARTERIAL (BILATERAL)    LEFT AND RIGHT HEART CATHETERIZATION WITH CORONARY ANGIOGRAM    2. Essential hypertension  I10 PCV LOWER ARTERIAL (BILATERAL)    LEFT AND RIGHT HEART CATHETERIZATION WITH CORONARY ANGIOGRAM    3. Aortic valve disorder  I35.9 PCV LOWER ARTERIAL (BILATERAL)    LEFT AND RIGHT HEART CATHETERIZATION WITH CORONARY ANGIOGRAM    4. Regional wall motion abnormality of heart  R93.1 PCV LOWER ARTERIAL (BILATERAL)    LEFT AND RIGHT HEART CATHETERIZATION WITH CORONARY ANGIOGRAM    5. Unstable angina (HCC)  I20.0 PCV LOWER ARTERIAL (BILATERAL)    LEFT AND RIGHT HEART CATHETERIZATION WITH CORONARY ANGIOGRAM    6. PAD (peripheral artery disease) (HCC)  I73.9 PCV LOWER ARTERIAL (BILATERAL)    LEFT AND  RIGHT HEART CATHETERIZATION WITH CORONARY ANGIOGRAM    7. Acute systolic heart failure (HCC)  I50.21 LEFT AND RIGHT HEART CATHETERIZATION WITH CORONARY ANGIOGRAM       Orders Placed This Encounter  Procedures   EKG 12-Lead   LEFT AND RIGHT HEART CATHETERIZATION WITH CORONARY ANGIOGRAM    No orders of the defined types were placed in this encounter.   There are no discontinued medications.   Recommendations:   JEANETTE MOFFATT is a 75 y.o.  male with newly reduced EF with regional wall motion abnormality and unstable angina   Continue current cardiac medications. Encourage low-sodium diet, less than 2000 mg daily. EKG reviewed with patient Prior echo discussed with pt, images reviewed by me during this visit Given newly depressed EF with WMA, will schedule pt for right and left heart catheterization with possible intervention with Dr Virgina Jock Patient has claudication and decreased pulses in both feet, will obtain BL lower extremity arterial ultrasound Follow-up in 1 months or sooner if needed.     Floydene Flock, DO, [facility]  05/06/2022, 1:44 PM Office: [phone] Pager: [phone]

## 2022-05-13 ENCOUNTER — Ambulatory Visit (HOSPITAL_BASED_OUTPATIENT_CLINIC_OR_DEPARTMENT_OTHER)
Admission: RE | Admit: 2022-05-13 | Discharge: 2022-05-13 | Disposition: A | Discharge status: Home / Self Care | Payer: Medicare Other | Source: Ambulatory Visit | Attending: Family Medicine | Admitting: Family Medicine

## 2022-05-13 ENCOUNTER — Ambulatory Visit (HOSPITAL_BASED_OUTPATIENT_CLINIC_OR_DEPARTMENT_OTHER): Payer: Medicare Other

## 2022-05-13 ENCOUNTER — Encounter (HOSPITAL_BASED_OUTPATIENT_CLINIC_OR_DEPARTMENT_OTHER): Payer: Self-pay

## 2022-05-13 DIAGNOSIS — R1013 Epigastric pain: Secondary | ICD-10-CM | POA: Diagnosis not present

## 2022-05-13 DIAGNOSIS — R1012 Left upper quadrant pain: Secondary | ICD-10-CM | POA: Diagnosis not present

## 2022-05-13 DIAGNOSIS — K7689 Other specified diseases of liver: Secondary | ICD-10-CM | POA: Diagnosis not present

## 2022-05-13 DIAGNOSIS — M47816 Spondylosis without myelopathy or radiculopathy, lumbar region: Secondary | ICD-10-CM | POA: Diagnosis not present

## 2022-05-13 DIAGNOSIS — J439 Emphysema, unspecified: Secondary | ICD-10-CM | POA: Diagnosis not present

## 2022-05-13 DIAGNOSIS — N289 Disorder of kidney and ureter, unspecified: Secondary | ICD-10-CM | POA: Diagnosis not present

## 2022-05-13 DIAGNOSIS — R911 Solitary pulmonary nodule: Secondary | ICD-10-CM

## 2022-05-13 MED ORDER — IOHEXOL 300 MG/ML  SOLN
100.0000 mL | Freq: Once | INTRAMUSCULAR | Status: AC | PRN
  Administered 2022-05-13: 100 mL via INTRAVENOUS

## 2022-05-19 ENCOUNTER — Ambulatory Visit (HOSPITAL_COMMUNITY)
Admission: RE | Disposition: A | Discharge status: Home / Self Care | Payer: Self-pay | Source: Home / Self Care | Attending: Cardiology

## 2022-05-19 ENCOUNTER — Encounter (HOSPITAL_COMMUNITY): Payer: Self-pay | Admitting: Cardiology

## 2022-05-19 ENCOUNTER — Other Ambulatory Visit: Payer: Self-pay

## 2022-05-19 ENCOUNTER — Ambulatory Visit (HOSPITAL_COMMUNITY)
Admission: RE | Admit: 2022-05-19 | Discharge: 2022-05-19 | Disposition: A | Discharge status: Home / Self Care | Payer: Medicare Other | Attending: Cardiology | Admitting: Cardiology

## 2022-05-19 DIAGNOSIS — I25110 Atherosclerotic heart disease of native coronary artery with unstable angina pectoris: Secondary | ICD-10-CM | POA: Insufficient documentation

## 2022-05-19 DIAGNOSIS — I5021 Acute systolic (congestive) heart failure: Secondary | ICD-10-CM | POA: Insufficient documentation

## 2022-05-19 DIAGNOSIS — F17210 Nicotine dependence, cigarettes, uncomplicated: Secondary | ICD-10-CM | POA: Diagnosis not present

## 2022-05-19 DIAGNOSIS — R931 Abnormal findings on diagnostic imaging of heart and coronary circulation: Secondary | ICD-10-CM | POA: Diagnosis not present

## 2022-05-19 DIAGNOSIS — I739 Peripheral vascular disease, unspecified: Secondary | ICD-10-CM | POA: Insufficient documentation

## 2022-05-19 DIAGNOSIS — I359 Nonrheumatic aortic valve disorder, unspecified: Secondary | ICD-10-CM | POA: Diagnosis not present

## 2022-05-19 DIAGNOSIS — I110 Hypertensive heart disease with heart failure: Secondary | ICD-10-CM | POA: Diagnosis not present

## 2022-05-19 DIAGNOSIS — I272 Pulmonary hypertension, unspecified: Secondary | ICD-10-CM | POA: Insufficient documentation

## 2022-05-19 HISTORY — PX: RIGHT/LEFT HEART CATH AND CORONARY ANGIOGRAPHY: CATH118266

## 2022-05-19 LAB — POCT I-STAT 7, (LYTES, BLD GAS, ICA,H+H)
Acid-base deficit: 1 mmol/L (ref 0.0–2.0)
Acid-base deficit: 1 mmol/L (ref 0.0–2.0)
Bicarbonate: 23.5 mmol/L (ref 20.0–28.0)
Bicarbonate: 24.6 mmol/L (ref 20.0–28.0)
Calcium, Ion: 1.19 mmol/L (ref 1.15–1.40)
Calcium, Ion: 1.21 mmol/L (ref 1.15–1.40)
HCT: 33 % — ABNORMAL LOW (ref 39.0–52.0)
HCT: 34 % — ABNORMAL LOW (ref 39.0–52.0)
Hemoglobin: 11.2 g/dL — ABNORMAL LOW (ref 13.0–17.0)
Hemoglobin: 11.6 g/dL — ABNORMAL LOW (ref 13.0–17.0)
O2 Saturation: 69 %
O2 Saturation: 96 %
Potassium: 4 mmol/L (ref 3.5–5.1)
Potassium: 4 mmol/L (ref 3.5–5.1)
Sodium: 139 mmol/L (ref 135–145)
Sodium: 139 mmol/L (ref 135–145)
TCO2: 25 mmol/L (ref 22–32)
TCO2: 26 mmol/L (ref 22–32)
pCO2 arterial: 38 mmHg (ref 32–48)
pCO2 arterial: 42.5 mmHg (ref 32–48)
pH, Arterial: 7.37 (ref 7.35–7.45)
pH, Arterial: 7.4 (ref 7.35–7.45)
pO2, Arterial: 37 mmHg — CL (ref 83–108)
pO2, Arterial: 85 mmHg (ref 83–108)

## 2022-05-19 LAB — GLUCOSE, CAPILLARY: Glucose-Capillary: 284 mg/dL — ABNORMAL HIGH (ref 70–99)

## 2022-05-19 SURGERY — RIGHT/LEFT HEART CATH AND CORONARY ANGIOGRAPHY
Anesthesia: LOCAL

## 2022-05-19 MED ORDER — LIDOCAINE HCL (PF) 1 % IJ SOLN
INTRAMUSCULAR | Status: DC | PRN
  Administered 2022-05-19: 5 mL
  Administered 2022-05-19: 2 mL

## 2022-05-19 MED ORDER — SODIUM CHLORIDE 0.9 % IV SOLN: 250.0000 mL | INTRAVENOUS | Status: DC | PRN

## 2022-05-19 MED ORDER — SODIUM CHLORIDE 0.9% FLUSH: 3.0000 mL | INTRAVENOUS | Status: DC | PRN

## 2022-05-19 MED ORDER — HEPARIN SODIUM (PORCINE) 1000 UNIT/ML IJ SOLN
INTRAMUSCULAR | Status: DC | PRN
  Administered 2022-05-19: 5000 [IU] via INTRAVENOUS

## 2022-05-19 MED ORDER — NITROGLYCERIN 1 MG/10 ML FOR IR/CATH LAB
INTRA_ARTERIAL | Status: AC
  Filled 2022-05-19: qty 10

## 2022-05-19 MED ORDER — SODIUM CHLORIDE 0.9% FLUSH: 3.0000 mL | Freq: Two times a day (BID) | INTRAVENOUS | Status: DC

## 2022-05-19 MED ORDER — FENTANYL CITRATE (PF) 100 MCG/2ML IJ SOLN
INTRAMUSCULAR | Status: AC
  Filled 2022-05-19: qty 2

## 2022-05-19 MED ORDER — IOHEXOL 350 MG/ML SOLN
INTRAVENOUS | Status: DC | PRN
  Administered 2022-05-19: 125 mL

## 2022-05-19 MED ORDER — LABETALOL HCL 5 MG/ML IV SOLN: 10.0000 mg | INTRAVENOUS | Status: DC | PRN

## 2022-05-19 MED ORDER — ASPIRIN 81 MG PO CHEW
CHEWABLE_TABLET | ORAL | Status: AC
  Filled 2022-05-19: qty 1

## 2022-05-19 MED ORDER — MIDAZOLAM HCL 2 MG/2ML IJ SOLN
INTRAMUSCULAR | Status: AC
  Filled 2022-05-19: qty 2

## 2022-05-19 MED ORDER — HEPARIN (PORCINE) IN NACL 1000-0.9 UT/500ML-% IV SOLN
INTRAVENOUS | Status: AC
  Filled 2022-05-19: qty 1000

## 2022-05-19 MED ORDER — FENTANYL CITRATE (PF) 100 MCG/2ML IJ SOLN
INTRAMUSCULAR | Status: DC | PRN
  Administered 2022-05-19: 50 ug via INTRAVENOUS
  Administered 2022-05-19: 25 ug via INTRAVENOUS

## 2022-05-19 MED ORDER — SODIUM CHLORIDE 0.9 % IV SOLN: INTRAVENOUS | Status: DC

## 2022-05-19 MED ORDER — MIDAZOLAM HCL 2 MG/2ML IJ SOLN
INTRAMUSCULAR | Status: DC | PRN
  Administered 2022-05-19: 1 mg via INTRAVENOUS

## 2022-05-19 MED ORDER — ACETAMINOPHEN 325 MG PO TABS: 650.0000 mg | ORAL_TABLET | ORAL | Status: DC | PRN

## 2022-05-19 MED ORDER — ASPIRIN 81 MG PO CHEW
81.0000 mg | CHEWABLE_TABLET | ORAL | Status: AC
  Administered 2022-05-19: 81 mg via ORAL
  Filled 2022-05-19: qty 1

## 2022-05-19 MED ORDER — HEPARIN (PORCINE) IN NACL 1000-0.9 UT/500ML-% IV SOLN
INTRAVENOUS | Status: DC | PRN
  Administered 2022-05-19 (×2): 500 mL

## 2022-05-19 MED ORDER — VERAPAMIL HCL 2.5 MG/ML IV SOLN
INTRAVENOUS | Status: DC | PRN
  Administered 2022-05-19 (×3): 10 mL via INTRA_ARTERIAL

## 2022-05-19 MED ORDER — CALCIUM CARBONATE ANTACID 500 MG PO CHEW
1.0000 | CHEWABLE_TABLET | Freq: Once | ORAL | Status: DC
  Filled 2022-05-19: qty 1

## 2022-05-19 MED ORDER — VERAPAMIL HCL 2.5 MG/ML IV SOLN
INTRAVENOUS | Status: AC
  Filled 2022-05-19: qty 2

## 2022-05-19 MED ORDER — HYDRALAZINE HCL 20 MG/ML IJ SOLN: 10.0000 mg | INTRAMUSCULAR | Status: DC | PRN

## 2022-05-19 MED ORDER — LIDOCAINE HCL (PF) 1 % IJ SOLN
INTRAMUSCULAR | Status: AC
  Filled 2022-05-19: qty 30

## 2022-05-19 MED ORDER — ONDANSETRON HCL 4 MG/2ML IJ SOLN: 4.0000 mg | Freq: Four times a day (QID) | INTRAMUSCULAR | Status: DC | PRN

## 2022-05-19 MED ORDER — HEPARIN SODIUM (PORCINE) 1000 UNIT/ML IJ SOLN
INTRAMUSCULAR | Status: AC
  Filled 2022-05-19: qty 10

## 2022-05-19 SURGICAL SUPPLY — 17 items
BAND ZEPHYR COMPRESS 30 LONG (HEMOSTASIS) IMPLANT
CATH BALLN WEDGE 5F 110CM (CATHETERS) IMPLANT
CATH INFINITI 5 FR 3DRC (CATHETERS) IMPLANT
CATH INFINITI 5 FR AR1 MOD (CATHETERS) IMPLANT
CATH INFINITI JR4 5F (CATHETERS) IMPLANT
CATH OPTITORQUE TIG 4.0 5F (CATHETERS) IMPLANT
GLIDESHEATH SLEND A-KIT 6F 22G (SHEATH) IMPLANT
GLIDESHEATH SLEND SS 6F .021 (SHEATH) IMPLANT
GUIDEWIRE INQWIRE 1.5J.035X260 (WIRE) IMPLANT
INQWIRE 1.5J .035X260CM (WIRE) ×1
KIT HEART LEFT (KITS) ×1 IMPLANT
PACK CARDIAC CATHETERIZATION (CUSTOM PROCEDURE TRAY) ×1 IMPLANT
SHEATH GLIDE SLENDER 4/5FR (SHEATH) IMPLANT
SHEATH PROBE COVER 6X72 (BAG) IMPLANT
TRANSDUCER W/STOPCOCK (MISCELLANEOUS) ×1 IMPLANT
TUBING CIL FLEX 10 FLL-RA (TUBING) ×1 IMPLANT
WIRE HI TORQ VERSACORE-J 145CM (WIRE) IMPLANT

## 2022-05-19 NOTE — Progress Notes (Signed)
Patient and wife was given discharge instructions. Both verbalized understanding.

## 2022-05-19 NOTE — Progress Notes (Signed)
Dr Virgina Jock notified of client c/o epigastric pain and order noted

## 2022-05-19 NOTE — Interval H&P Note (Signed)
History and Physical Interval Note:  05/19/2022 8:18 AM  Thomas [name]  has presented today for surgery, with the diagnosis of unstable angina.  The various methods of treatment have been discussed with the patient and family. After consideration of risks, benefits and other options for treatment, the patient has consented to  Procedure(s): RIGHT/LEFT HEART CATH AND CORONARY ANGIOGRAPHY (N/A) as a surgical intervention.  The patient's history has been reviewed, patient examined, no change in status, stable for surgery.  I have reviewed the patient's chart and labs.  Questions were answered to the patient's satisfaction.    2012 Appropriate Use Criteria for Diagnostic Catheterization Cardiomyopathies (Right and Left Heart Catheterization OR Right Heart Catheterization Alone With/Without Left Ventriculography and Coronary Angiography) Indication:  Known or suspected cardiomyopathy with or without heart failure A (7) Indication: 93; Score 7   Chancy Claros J Zaya Kessenich

## 2022-05-19 NOTE — Discharge Instructions (Signed)

## 2022-05-19 NOTE — Progress Notes (Signed)
TR BAND REMOVAL  LOCATION:    right radial  DEFLATED PER PROTOCOL:    Yes.    TIME BAND OFF / DRESSING APPLIED:    1130   SITE UPON ARRIVAL:    Level 0  SITE AFTER BAND REMOVAL:    Level 0  CIRCULATION SENSATION AND MOVEMENT:    Within Normal Limits   Yes.    COMMENTS:   No bleeding noted

## 2022-05-19 NOTE — H&P (Signed)
OV 05/06/2022 copied for documentation    Primary Physician/Referring:  Haydee Salter, MD  Patient ID: Thomas [name], male    DOB: 07-Jun-1946, 76 y.o.   MRN: [id number]  Chief Complaint  Patient presents with   Congestive Heart Failure   Coronary Artery Disease   New Patient (Initial Visit)    Referred by Dr. Arlester Marker   HPI:    Thomas [name]  is a 76 y.o. male with past medical history significant for hypertension, newly reduced ejection fraction, and unstable angina who is here to establish care with cardiology.  Patient states that he has been having chest pain all the time even at rest.  He states that the only time he does not have chest pain is when he is sleeping which is even difficult to do nowadays.  He also admits to severe claudication.  Reviewing his recent echocardiogram from June it shows newly reduced ejection fraction with regional wall motion abnormality.  Patient is on optimal medical therapy at this time.  Given his chest pain at rest and decreased ejection fraction we will go ahead with left and right heart catheterization rather than stress test.  He will also get a bilateral lower extremity arterial ultrasound due to significantly decreased pulses in both legs.  Patient is a smoker but he is currently trying to quit.  He has cut his smoking down to about half a pack a day at this point. Denies palpitations, diaphoresis, syncope, PND, orthopnea.   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   Obesity 05/12/2017   Osteoarthritis of knee 05/12/2017   Paresthesia 05/12/2017   Peripheral sensory  neuropathy 05/12/2017   Prostate nodule 05/12/2017   Right rotator cuff tear 10/01/2021   Seborrheic dermatitis 05/12/2017   Past Surgical History:  Procedure Laterality Date   HAND SURGERY Right    ORIF ANKLE FRACTURE Left 08/15/2007   Distal fibular   REPLACEMENT TOTAL KNEE Right 07/12/2012   RIGHT/LEFT HEART CATH AND CORONARY ANGIOGRAPHY N/A 01/12/2018   Procedure: RIGHT/LEFT HEART CATH AND CORONARY ANGIOGRAPHY;  Surgeon: Belva Crome, MD;  Location: [facility];  Service: Cardiovascular;  Laterality: N/A;   SHOULDER SURGERY Right    UMBILICAL HERNIA REPAIR  06/23/2013   Family History  Problem Relation Age of Onset   Hypertension Mother    Hypertension Father    Alzheimer's disease Father    Heart disease Sister    Heart disease Paternal Aunt     Social History   Tobacco Use   Smoking status: Every Day    Packs/day: 0.50    Years: 55.00    Total pack years: 27.50    Types: Cigarettes    Passive exposure: Current   Smokeless tobacco: Never   Tobacco comments:    Has cut back  Substance Use Topics   Alcohol use: No   Marital Status: Married  ROS  Review of Systems  Cardiovascular:  Positive for chest pain, claudication and dyspnea on exertion.  All other systems reviewed and are negative.  Objective  Blood pressure (!) 142/79,  pulse 80, temperature 98 F (36.7 C), temperature source Temporal, resp. rate 16, height _0  (1.803 m), weight 213 lb (96.6 kg), SpO2 97 %. Body mass index is 29.71 kg/m.     05/06/2022   10:23 AM 04/28/2022    9:41 AM 04/14/2022    2:06 PM  Vitals with BMI  Height _1  _2    Weight 213 lbs 213 lbs   BMI 39.03 00.92   Systolic 330 076 226  Diastolic 79 80 72  Pulse 80 80 83     Physical Exam Vitals and nursing note reviewed.  Constitutional:      Appearance: Normal appearance.  HENT:     Head: Normocephalic and atraumatic.  Neck:     Vascular: No carotid bruit.  Cardiovascular:     Rate and Rhythm: Normal rate and  regular rhythm.     Pulses:          Dorsalis pedis pulses are 0 on the right side and 0 on the left side.       Posterior tibial pulses are 1+ on the right side and 1+ on the left side.     Heart sounds: Murmur heard.     No gallop.  Pulmonary:     Effort: Pulmonary effort is normal.     Breath sounds: Normal breath sounds.  Abdominal:     General: Bowel sounds are normal.     Palpations: Abdomen is soft.  Musculoskeletal:     Right lower leg: No edema.     Left lower leg: No edema.  Skin:    General: Skin is warm and dry.  Neurological:     Mental Status: He is alert.    Medications and allergies   Allergies  Allergen Reactions   Wasp Venom Anaphylaxis, Itching and Swelling     Medication list after today's encounter   Current Outpatient Medications:    amLODipine (NORVASC) 10 MG tablet, Take 10 mg by mouth in the morning. Heart Medication, Disp: , Rfl:    aspirin 81 MG chewable tablet, Chew 81 mg by mouth daily at 12 noon. Heart medication, Disp: , Rfl:    atorvastatin (LIPITOR) 80 MG tablet, Take 40 mg by mouth at bedtime. Heart medication, Disp: , Rfl:    Calcium Carbonate-Vitamin D 600-400 MG-UNIT tablet, Take 1 tablet by mouth 2 (two) times daily., Disp: , Rfl:    cetirizine (ZYRTEC) 10 MG tablet, Take 10 mg by mouth daily as needed for allergies., Disp: , Rfl:    colchicine 0.6 MG tablet, Take 1 tablet (0.6 mg total) by mouth 2 (two) times daily., Disp: 60 tablet, Rfl: 2   diclofenac Sodium (VOLTAREN) 1 % GEL, Apply 1 application topically 4 (four) times daily as needed (pain)., Disp: , Rfl:    DULoxetine (CYMBALTA) 60 MG capsule, Take 60 mg by mouth every evening. , Disp: , Rfl:    empagliflozin (JARDIANCE) 10 MG TABS tablet, Take 1 tablet (10 mg total) by mouth daily before breakfast., Disp: 30 tablet, Rfl: 11   EPINEPHrine 0.3 mg/0.3 mL IJ SOAJ injection, Inject 0.3 mg into the muscle daily as needed (for anaphylaxis). , Disp: , Rfl:    furosemide (LASIX) 40 MG  tablet, Take 1 tablet (40 mg total) by mouth daily., Disp: 90 tablet, Rfl: 3   glipiZIDE (GLUCOTROL) 10 MG tablet, Take 10 mg by mouth in the morning and at bedtime., Disp: , Rfl:    Ibuprofen 200 MG CAPS, Take 3 capsules (600  mg total) by mouth every 8 (eight) hours. X 2 weeks., Disp: 120 capsule, Rfl: 0   meloxicam (MOBIC) 15 MG tablet, Take 7.5 mg by mouth in the morning and at bedtime., Disp: , Rfl:    methadone (DOLOPHINE) 5 MG tablet, Take 5 mg by mouth every other day. For severe chronic pain, Disp: , Rfl:    omeprazole (PRILOSEC) 40 MG capsule, Take 40 mg by mouth daily before breakfast., Disp: , Rfl:    oxyCODONE (OXY IR/ROXICODONE) 5 MG immediate release tablet, Take 5 mg by mouth 4 (four) times daily as needed (severe chronic pain). , Disp: , Rfl:    sacubitril-valsartan (ENTRESTO) 97-103 MG, Take 1 tablet by mouth 2 (two) times daily., Disp: 180 tablet, Rfl: 3   tamsulosin (FLOMAX) 0.4 MG CAPS capsule, TAKE ONE CAPSULE BY MOUTH EVERY NIGHT FOR URINATION/PROSTATE * REPLACES TERAZOSIN *, Disp: , Rfl:    tiZANidine (ZANAFLEX) 4 MG tablet, Take 4 mg by mouth at bedtime., Disp: , Rfl:    Vibegron 75 MG TABS, Take 75 mg by mouth daily., Disp: , Rfl:   Laboratory examination:   Lab Results  Component Value Date   NA 138 04/28/2022   K 4.1 04/28/2022   CO2 29 04/28/2022   GLUCOSE 98 04/28/2022   BUN 13 04/28/2022   CREATININE 1.04 04/28/2022   CALCIUM 9.5 04/28/2022   EGFR 40 (L) 03/09/2022   GFRNONAA 38 (L) 02/20/2022       Latest Ref Rng & Units 04/28/2022   10:41 AM 03/09/2022   11:03 AM 02/24/2022   11:50 AM  CMP  Glucose 70 - 99 mg/dL 98  194  91   BUN 6 - 23 mg/dL 13  25  35   Creatinine 0.40 - 1.50 mg/dL 1.04  1.74  1.54   Sodium 135 - 145 mEq/L 138  138  144   Potassium 3.5 - 5.1 mEq/L 4.1  4.4  4.7   Chloride 96 - 112 mEq/L 99  101  104   CO2 19 - 32 mEq/L _0 Calcium 8.4 - 10.5 mg/dL 9.5  9.4  9.5   Total Protein 6.0 - 8.3 g/dL 7.6     Total Bilirubin 0.2  - 1.2 mg/dL 0.8     Alkaline Phos 39 - 117 U/L 88     AST 0 - 37 U/L 19     ALT 0 - 53 U/L 17         Latest Ref Rng & Units 04/28/2022   10:41 AM 02/20/2022    3:48 PM 04/16/2021    1:34 PM  CBC  WBC 4.0 - 10.5 K/uL 13.2  8.5  8.5   Hemoglobin 13.0 - 17.0 g/dL 14.3  15.4  13.9   Hematocrit 39.0 - 52.0 % 42.3  44.3  40.8   Platelets 150.0 - 400.0 K/uL 302.0  297  277     Lipid Panel Recent Labs    04/28/22 1041  CHOL 234*  TRIG 180.0*  LDLCALC 161*  VLDL 36.0  HDL 37.80*  CHOLHDL 6    HEMOGLOBIN A1C Lab Results  Component Value Date   HGBA1C 7.0 (H) 04/28/2022   MPG 202.99 12/11/2019   TSH No results for input(s): "TSH" in the last 8760 hours.  External labs:     Radiology:    Cardiac Studies:   No results found for this or any previous visit from the past 1095 days.     No  results found for this or any previous visit from the past 1095 days.  Echo 02/2022 reviewed by me, agree with EF 45-50% however there is anterolateral wall motion abnormality. All of which is new.   EKG:   05/06/22:  Sinus Rhythm -occasional PAC. LAFB and LVH present. Possible lateral ischemia   Assessment     ICD-10-CM   1. Hypertensive heart disease with heart failure (HCC)  I11.0 EKG 12-Lead    PCV LOWER ARTERIAL (BILATERAL)    LEFT AND RIGHT HEART CATHETERIZATION WITH CORONARY ANGIOGRAM    2. Essential hypertension  I10 PCV LOWER ARTERIAL (BILATERAL)    LEFT AND RIGHT HEART CATHETERIZATION WITH CORONARY ANGIOGRAM    3. Aortic valve disorder  I35.9 PCV LOWER ARTERIAL (BILATERAL)    LEFT AND RIGHT HEART CATHETERIZATION WITH CORONARY ANGIOGRAM    4. Regional wall motion abnormality of heart  R93.1 PCV LOWER ARTERIAL (BILATERAL)    LEFT AND RIGHT HEART CATHETERIZATION WITH CORONARY ANGIOGRAM    5. Unstable angina (HCC)  I20.0 PCV LOWER ARTERIAL (BILATERAL)    LEFT AND RIGHT HEART CATHETERIZATION WITH CORONARY ANGIOGRAM    6. PAD (peripheral artery disease) (HCC)  I73.9 PCV  LOWER ARTERIAL (BILATERAL)    LEFT AND RIGHT HEART CATHETERIZATION WITH CORONARY ANGIOGRAM    7. Acute systolic heart failure (HCC)  I50.21 LEFT AND RIGHT HEART CATHETERIZATION WITH CORONARY ANGIOGRAM       Orders Placed This Encounter  Procedures   EKG 12-Lead   LEFT AND RIGHT HEART CATHETERIZATION WITH CORONARY ANGIOGRAM    No orders of the defined types were placed in this encounter.   There are no discontinued medications.   Recommendations:   Thomas [name] is a 76 y.o.  male with newly reduced EF with regional wall motion abnormality and unstable angina   Continue current cardiac medications. Encourage low-sodium diet, less than 2000 mg daily. EKG reviewed with patient Prior echo discussed with pt, images reviewed by me during this visit Given newly depressed EF with WMA, will schedule pt for right and left heart catheterization with possible intervention with Dr Virgina Jock Patient has claudication and decreased pulses in both feet, will obtain BL lower extremity arterial ultrasound Follow-up in 1 months or sooner if needed.     Floydene Flock, DO, [facility]  05/06/2022, 1:44 PM Office: [phone] Pager: [phone]

## 2022-05-20 MED FILL — Verapamil HCl IV Soln 2.5 MG/ML: INTRAVENOUS | Qty: 2 | Status: AC

## 2022-05-20 MED FILL — Nitroglycerin IV Soln 100 MCG/ML in D5W: INTRA_ARTERIAL | Qty: 10 | Status: AC

## 2022-05-27 ENCOUNTER — Ambulatory Visit: Payer: Medicare Other | Admitting: Internal Medicine

## 2022-05-27 ENCOUNTER — Encounter: Payer: Self-pay | Admitting: Internal Medicine

## 2022-05-27 VITALS — BP 152/74 | HR 76 | Temp 98.1°F | Resp 16 | Ht 71.0 in | Wt 214.8 lb

## 2022-05-27 DIAGNOSIS — I10 Essential (primary) hypertension: Secondary | ICD-10-CM | POA: Diagnosis not present

## 2022-05-27 DIAGNOSIS — M545 Low back pain, unspecified: Secondary | ICD-10-CM | POA: Diagnosis not present

## 2022-05-27 DIAGNOSIS — I25119 Atherosclerotic heart disease of native coronary artery with unspecified angina pectoris: Secondary | ICD-10-CM | POA: Diagnosis not present

## 2022-05-27 DIAGNOSIS — I739 Peripheral vascular disease, unspecified: Secondary | ICD-10-CM

## 2022-05-27 MED ORDER — METOPROLOL SUCCINATE ER 25 MG PO TB24: 25.0000 mg | ORAL_TABLET | Freq: Every day | ORAL | 3 refills | Status: DC

## 2022-05-27 MED ORDER — RANOLAZINE ER 500 MG PO TB12: 500.0000 mg | ORAL_TABLET | Freq: Two times a day (BID) | ORAL | 3 refills | Status: DC

## 2022-05-27 MED ORDER — ISOSORBIDE MONONITRATE ER 30 MG PO TB24: 30.0000 mg | ORAL_TABLET | Freq: Every day | ORAL | 2 refills | Status: DC

## 2022-05-27 MED ORDER — ISOSORBIDE MONONITRATE ER 30 MG PO TB24: 30.0000 mg | ORAL_TABLET | Freq: Every day | ORAL | 2 refills | Status: AC

## 2022-05-27 MED ORDER — METOPROLOL SUCCINATE ER 25 MG PO TB24: 25.0000 mg | ORAL_TABLET | Freq: Every day | ORAL | 2 refills | Status: DC

## 2022-05-27 NOTE — Progress Notes (Signed)
Primary Physician/Referring:  Haydee Salter, MD  Patient ID: Thomas [name], male    DOB: [birth year], 76 y.o.   MRN: [id number]  Chief Complaint  Patient presents with   Follow-up   HPI:    Thomas [name]  is a 76 y.o. male with past medical history significant for hypertension, newly reduced ejection fraction, and unstable angina who is here for a follow-up visit.  Patient states that he has is still having chest pain.  He underwent catheterization on September 12 which showed moderate coronary artery disease, possibly obstructive lesion in the RCA.  The plan is to optimize the patient on antianginal therapy and see him back in 4 weeks.  If patient is still having persistent angina, he will go for intervention of the RCA with Dr. Virgina Jock.  He is still complaining of significant claudication in both of his legs.  He does have a bilateral lower extremity ultrasound scheduled here on September 28.  Patient is agreeable with this plan.   Past Medical History:  Diagnosis Date   Actinic keratosis 05/12/2017   Back pain    Chronic systolic (congestive) heart failure ([place]) 05/06/2022   Depression 05/12/2017   Diabetes ([place])    GERD (gastroesophageal reflux disease) 05/12/2017   Hx of colonic polyps 05/12/2017   Hyperlipidemia 05/12/2017   Hypertension    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/2017   Low back pain 05/12/2017   Lumbar radiculopathy 05/12/2017   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   Obesity 05/12/2017   Osteoarthritis of knee 05/12/2017   Paresthesia 05/12/2017   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   Prostate nodule 05/12/2017   Right rotator cuff tear 10/01/2021   Seborrheic dermatitis 05/12/2017   Past Surgical History:  Procedure Laterality Date   HAND SURGERY Right    ORIF ANKLE FRACTURE Left 08/15/2007   Distal fibular   REPLACEMENT TOTAL KNEE Right 07/12/2012   RIGHT/LEFT HEART CATH AND CORONARY ANGIOGRAPHY N/A 01/12/2018   Procedure: RIGHT/LEFT HEART CATH AND CORONARY ANGIOGRAPHY;  Surgeon: Belva Crome, MD;  Location: [facility];  Service: Cardiovascular;  Laterality: N/A;   RIGHT/LEFT HEART CATH AND CORONARY ANGIOGRAPHY N/A 05/19/2022   Procedure: RIGHT/LEFT HEART CATH AND CORONARY ANGIOGRAPHY;  Surgeon: Nigel Mormon, MD;  Location: [facility];  Service: Cardiovascular;  Laterality: N/A;   SHOULDER SURGERY Right    UMBILICAL HERNIA REPAIR  06/23/2013   Family History  Problem Relation Age of Onset   Hypertension Mother    Hypertension Father    Alzheimer's disease Father    Heart disease Sister    Heart disease Paternal Aunt     Social History   Tobacco Use   Smoking status: Every Day    Packs/day: 0.50    Years: 55.00    Total pack years: 27.50    Types: Cigarettes    Passive exposure: Current   Smokeless tobacco: Never   Tobacco comments:    Has cut back  Substance Use Topics   Alcohol use: No   Marital Status: Married  ROS  Review of Systems  Cardiovascular:  Positive for chest pain, claudication and dyspnea on exertion.  All other systems reviewed and are negative.  Objective  Blood pressure (!) 152/74, pulse 76, temperature 98.1 F (36.7 C), temperature source Temporal, resp. rate 16, height _0  (1.803 m), weight 214 lb 12.8 oz (97.4 kg), SpO2 97 %. Body mass index is 29.96 kg/m.     05/27/2022    9:50 AM 05/19/2022  11:10 AM 05/19/2022   10:45 AM  Vitals with BMI  Height _0     Weight 214 lbs 13 oz    BMI 58.83    Systolic 254 982 641  Diastolic 74 61 62  Pulse 76 70 71     Physical Exam Vitals and nursing note reviewed.  Constitutional:      Appearance: Normal appearance.  HENT:     Head: Normocephalic and atraumatic.  Neck:     Vascular: No carotid bruit.  Cardiovascular:     Rate and Rhythm: Normal rate and regular rhythm.     Pulses:          Dorsalis pedis pulses are 0 on the right side and 0 on the left side.       Posterior tibial pulses are 1+ on the right side and 1+ on the left side.     Heart sounds:  Murmur heard.     No gallop.  Pulmonary:     Effort: Pulmonary effort is normal.     Breath sounds: Normal breath sounds.  Abdominal:     General: Bowel sounds are normal.     Palpations: Abdomen is soft.  Musculoskeletal:     Right lower leg: No edema.     Left lower leg: No edema.  Skin:    General: Skin is warm and dry.  Neurological:     Mental Status: He is alert.    Medications and allergies   Allergies  Allergen Reactions   Wasp Venom Anaphylaxis, Itching and Swelling     Medication list after today's encounter   Current Outpatient Medications:    amLODipine (NORVASC) 10 MG tablet, Take 10 mg by mouth in the morning. Heart Medication, Disp: , Rfl:    aspirin 81 MG chewable tablet, Chew 81 mg by mouth daily at 12 noon. Heart medication, Disp: , Rfl:    atorvastatin (LIPITOR) 80 MG tablet, Take 40 mg by mouth at bedtime. Heart medication, Disp: , Rfl:    Calcium Carbonate-Vitamin D 600-400 MG-UNIT tablet, Take 1 tablet by mouth 2 (two) times daily., Disp: , Rfl:    cetirizine (ZYRTEC) 10 MG tablet, Take 10 mg by mouth daily as needed for allergies., Disp: , Rfl:    colchicine 0.6 MG tablet, Take 1 tablet (0.6 mg total) by mouth 2 (two) times daily., Disp: 60 tablet, Rfl: 2   diclofenac Sodium (VOLTAREN) 1 % GEL, Apply 1 application topically 4 (four) times daily as needed (pain)., Disp: , Rfl:    DULoxetine (CYMBALTA) 60 MG capsule, Take 60 mg by mouth every evening. , Disp: , Rfl:    empagliflozin (JARDIANCE) 10 MG TABS tablet, Take 1 tablet (10 mg total) by mouth daily before breakfast., Disp: 30 tablet, Rfl: 11   EPINEPHrine 0.3 mg/0.3 mL IJ SOAJ injection, Inject 0.3 mg into the muscle daily as needed (for anaphylaxis). , Disp: , Rfl:    furosemide (LASIX) 40 MG tablet, Take 1 tablet (40 mg total) by mouth daily., Disp: 90 tablet, Rfl: 3   glipiZIDE (GLUCOTROL) 10 MG tablet, Take 10 mg by mouth in the morning and at bedtime., Disp: , Rfl:    isosorbide mononitrate  (IMDUR) 30 MG 24 hr tablet, Take 1 tablet (30 mg total) by mouth daily., Disp: 30 tablet, Rfl: 2   isosorbide mononitrate (IMDUR) 30 MG 24 hr tablet, Take 1 tablet (30 mg total) by mouth daily., Disp: 30 tablet, Rfl: 2   methadone (DOLOPHINE) 5 MG tablet, Take  5 mg by mouth every other day. For severe chronic pain, Disp: , Rfl:    metoprolol succinate (TOPROL XL) 25 MG 24 hr tablet, Take 1 tablet (25 mg total) by mouth daily., Disp: 30 tablet, Rfl: 3   metoprolol succinate (TOPROL XL) 25 MG 24 hr tablet, Take 1 tablet (25 mg total) by mouth daily., Disp: 30 tablet, Rfl: 2   omeprazole (PRILOSEC) 40 MG capsule, Take 40 mg by mouth daily before breakfast., Disp: , Rfl:    oxyCODONE (OXY IR/ROXICODONE) 5 MG immediate release tablet, Take 5 mg by mouth 4 (four) times daily as needed (severe chronic pain). , Disp: , Rfl:    sacubitril-valsartan (ENTRESTO) 97-103 MG, Take 1 tablet by mouth 2 (two) times daily., Disp: 180 tablet, Rfl: 3   tamsulosin (FLOMAX) 0.4 MG CAPS capsule, Take 0.4 mg by mouth at bedtime., Disp: , Rfl:    tiZANidine (ZANAFLEX) 4 MG tablet, Take 4 mg by mouth at bedtime., Disp: , Rfl:    Vibegron 75 MG TABS, Take 75 mg by mouth daily., Disp: , Rfl:   Laboratory examination:   Lab Results  Component Value Date   NA 139 05/19/2022   K 4.0 05/19/2022   CO2 29 04/28/2022   GLUCOSE 98 04/28/2022   BUN 13 04/28/2022   CREATININE 1.04 04/28/2022   CALCIUM 9.5 04/28/2022   EGFR 40 (L) 03/09/2022   GFRNONAA 38 (L) 02/20/2022       Latest Ref Rng & Units 05/19/2022    8:38 AM 05/19/2022    8:37 AM 04/28/2022   10:41 AM  CMP  Glucose 70 - 99 mg/dL   98   BUN 6 - 23 mg/dL   13   Creatinine 0.40 - 1.50 mg/dL   1.04   Sodium 135 - 145 mmol/L 139  139  138   Potassium 3.5 - 5.1 mmol/L 4.0  4.0  4.1   Chloride 96 - 112 mEq/L   99   CO2 19 - 32 mEq/L   29   Calcium 8.4 - 10.5 mg/dL   9.5   Total Protein 6.0 - 8.3 g/dL   7.6   Total Bilirubin 0.2 - 1.2 mg/dL   0.8   Alkaline Phos  39 - 117 U/L   88   AST 0 - 37 U/L   19   ALT 0 - 53 U/L   17       Latest Ref Rng & Units 05/19/2022    8:38 AM 05/19/2022    8:37 AM 04/28/2022   10:41 AM  CBC  WBC 4.0 - 10.5 K/uL   13.2   Hemoglobin 13.0 - 17.0 g/dL 11.2  11.6  14.3   Hematocrit 39.0 - 52.0 % 33.0  34.0  42.3   Platelets 150.0 - 400.0 K/uL   302.0     Lipid Panel Recent Labs    04/28/22 1041  CHOL 234*  TRIG 180.0*  LDLCALC 161*  VLDL 36.0  HDL 37.80*  CHOLHDL 6    HEMOGLOBIN A1C Lab Results  Component Value Date   HGBA1C 7.0 (H) 04/28/2022   MPG 202.99 12/11/2019   TSH No results for input(s): "TSH" in the last 8760 hours.  External labs:     Radiology:    Cardiac Studies:   No results found for this or any previous visit from the past 1095 days.     No results found for this or any previous visit from the past 1095 days.  Echo 02/2022  reviewed by me, agree with EF 45-50% however there is anterolateral wall motion abnormality. All of which is new.  05/19/22 heart catheterization LM: Long vessel. 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 Mid RCA lesion may well be obstructive during stress. Recommend further optimization of medical anti anginal therapy at this time. If no improvement in true angina symptoms, could consider repeat cath through femoral access, RFR, and if  positive-atherectomy and PCI to mid RCA.   EKG:   05/06/22:  Sinus Rhythm -occasional PAC. LAFB and LVH present. Possible lateral ischemia   Assessment     ICD-10-CM   1. PAD (peripheral artery disease) (HCC)  I73.9     2. Coronary artery disease involving native coronary artery of native heart with angina pectoris ([place])  I25.119     3. Essential hypertension  I10        No orders of the defined types were placed in this encounter.   Meds ordered this encounter  Medications   isosorbide mononitrate (IMDUR) 30 MG 24 hr tablet    Sig: Take 1 tablet (30 mg total) by mouth daily.    Dispense:  30 tablet    Refill:  2   DISCONTD: ranolazine (RANEXA) 500 MG 12 hr tablet    Sig: Take 1 tablet (500 mg total) by mouth 2 (two) times daily.    Dispense:  60 tablet    Refill:  3   metoprolol succinate (TOPROL XL) 25 MG 24 hr tablet    Sig: Take 1 tablet (25 mg total) by mouth daily.    Dispense:  30 tablet    Refill:  3   isosorbide mononitrate (IMDUR) 30 MG 24 hr tablet    Sig: Take 1 tablet (30 mg total) by mouth daily.    Dispense:  30 tablet    Refill:  2   metoprolol succinate (TOPROL XL) 25 MG 24 hr tablet    Sig: Take 1 tablet (25 mg total) by mouth daily.    Dispense:  30 tablet    Refill:  2     Medications Discontinued During This Encounter  Medication Reason   ranolazine (RANEXA) 500 MG 12 hr tablet      Recommendations:   Thomas [name] is a 76 y.o.  male with newly reduced EF with regional wall motion abnormality and unstable angina s/p LHC on September 12   Continue current cardiac medications. Encourage low-sodium diet, less than 2000 mg daily. Discussed catheterization results with patient, will optimize antianginal therapy Metoprolol 25 mg and Imdur 30 mg sent to pharmacy Will see patient in 4 weeks, if angina persists, will repeat catheterization for intervention of RCA Patient is still having symptoms of claudication Bilateral lower extremity  ultrasound scheduled for  September 28 Follow-up in 1 months or sooner if needed.     Floydene Flock, DO, [facility]  05/27/2022, 10:26 AM Office: [phone] Pager: [phone]

## 2022-05-28 ENCOUNTER — Ambulatory Visit (INDEPENDENT_AMBULATORY_CARE_PROVIDER_SITE_OTHER): Payer: Medicare Other | Admitting: Family Medicine

## 2022-05-28 ENCOUNTER — Encounter: Payer: Self-pay | Admitting: Family Medicine

## 2022-05-28 VITALS — Temp 97.4°F | Ht 71.0 in | Wt 217.8 lb

## 2022-05-28 DIAGNOSIS — I25119 Atherosclerotic heart disease of native coronary artery with unspecified angina pectoris: Secondary | ICD-10-CM | POA: Diagnosis not present

## 2022-05-28 DIAGNOSIS — N2889 Other specified disorders of kidney and ureter: Secondary | ICD-10-CM

## 2022-05-28 DIAGNOSIS — Z23 Encounter for immunization: Secondary | ICD-10-CM | POA: Diagnosis not present

## 2022-05-28 DIAGNOSIS — R93429 Abnormal radiologic findings on diagnostic imaging of unspecified kidney: Secondary | ICD-10-CM | POA: Diagnosis not present

## 2022-05-28 DIAGNOSIS — R911 Solitary pulmonary nodule: Secondary | ICD-10-CM | POA: Diagnosis not present

## 2022-05-28 NOTE — Progress Notes (Signed)
[facility] [address] [address] Dept: [phone] Dept Fax: [phone]  Chronic Care Office Visit  Subjective:    Patient ID: Thomas [name], male    DOB: Sep 01, 1946, 76 y.o..   MRN: [id number]  Chief Complaint  Patient presents with   Follow-up    F/u meds.   Had a fall on 05/27/22 in the shop.  LT knee pain.     History of Present Illness:  Patient is in today for reassessment of chronic medical issues.  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 He has been seeing Dr. Bettina Gavia (cardiology), but became frustrated with his care there. At his request, I referred him to [facility]. He is now seeing Dr. Shellia Carwin, and he feels the care is going better. Last week, he had a heart cath. This does show some moderate CAD with  a possible RCA obstruction. He is currently managed on Entresto 97-103 bid, amlodipine 10 mg daily, a daily aspirin, empagliflozin 10 mg daily and Lasix 40 mg daily. Dr. Shellia Carwin also added metoprolol 25 mg daily and Imdur 30 mg daily.   Past Medical History: Patient Active Problem List   Diagnosis Date Noted   Chronic systolic (congestive) heart failure ([place]) 05/06/2022   Aortic valve disorder 05/06/2022   Unstable angina ([place]) 05/06/2022   Regional wall motion abnormality of heart 05/06/2022   PAD (peripheral artery disease) ([place]) 34/74/2595   Acute systolic heart failure ([place]) 05/06/2022   Pericardial effusion 12/24/2021   Pleural effusion 11/04/2021   Pulmonary edema 11/04/2021   Incidental pulmonary nodule, > 73m and < 866m02/28/2023   Spinal stenosis at L4-L5 level 10/01/2021   Peripheral arterial disease ([facility]01/25/2023   Type 2 diabetes mellitus with diabetic neuropathy, unspecified ([facility]01/20/2023   Sensorineural hearing loss, bilateral 09/26/2021   Personal history of  methicillin resistant Staphylococcus aureus 09/26/2021   Degeneration of lumbar or lumbosacral intervertebral disc 09/26/2021   Tobacco dependence 09/26/2021   Excessive daytime sleepiness 11/13/2020   Narcotic habituation, continuous (HCCec[place]03/05/2021   At risk for central sleep apnea 11/13/2020   History of claustrophobia 11/13/2020   Obesity (BMI 30.0-34.9) 11/13/2020   Nocturia 11/13/2020   PTSD (post-traumatic stress disorder) 11/13/2020   Cognitive and behavioral changes 12/28/2019   Essential hypertension    Acute metabolic encephalopathy    Coronary artery disease 02/11/2018   Claudication in peripheral vascular disease (HCChaunce[facility]06/03/2018   Atypical chest pain 01/12/2018   Coronary artery calcification 01/07/2018   CKD (chronic kidney disease) stage 3, GFR 30-59 ml/min (HCC) 11/25/2017   Chronic diastolic heart failure (HCFoot[place]09/02/2017   Aortic stenosis, mild 05/13/2017   Low back pain 05/12/2017   GERD (gastroesophageal reflux disease) 05/12/2017   Prostate nodule 05/12/2017   Depression 05/12/2017   Hx of colonic polyps 05/12/2017   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/2017   Hyperlipidemia 05/12/2017   Hypertensive heart disease with heart failure (HCGraham09/01/2017   Osteoarthritis of left knee 05/12/2017   Sleep apnea 05/12/2017   Actinic keratosis 05/12/2017   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   Seborrheic dermatitis 05/12/2017   Paresthesia 05/12/2017   Past Surgical History:  Procedure Laterality Date   HAND SURGERY Right    ORIF ANKLE FRACTURE Left 08/15/2007   Distal fibular   REPLACEMENT TOTAL KNEE Right 07/12/2012   RIGHT/LEFT HEART CATH AND CORONARY ANGIOGRAPHY N/A 01/12/2018  Procedure: RIGHT/LEFT HEART CATH AND CORONARY ANGIOGRAPHY;  Surgeon: Belva Crome, MD;  Location: [facility];  Service: Cardiovascular;  Laterality: N/A;   RIGHT/LEFT HEART CATH AND CORONARY ANGIOGRAPHY N/A 05/19/2022   Procedure: RIGHT/LEFT HEART  CATH AND CORONARY ANGIOGRAPHY;  Surgeon: Nigel Mormon, MD;  Location: [facility];  Service: Cardiovascular;  Laterality: N/A;   SHOULDER SURGERY Right    UMBILICAL HERNIA REPAIR  06/23/2013   Family History  Problem Relation Age of Onset   Hypertension Mother    Hypertension Father    Alzheimer's disease Father    Heart disease Sister    Heart disease Paternal Aunt    Outpatient Medications Prior to Visit  Medication Sig Dispense Refill   amLODipine (NORVASC) 10 MG tablet Take 10 mg by mouth in the morning. Heart Medication     aspirin 81 MG chewable tablet Chew 81 mg by mouth daily at 12 noon. Heart medication     atorvastatin (LIPITOR) 80 MG tablet Take 40 mg by mouth at bedtime. Heart medication     Calcium Carbonate-Vitamin D 600-400 MG-UNIT tablet Take 1 tablet by mouth 2 (two) times daily.     cetirizine (ZYRTEC) 10 MG tablet Take 10 mg by mouth daily as needed for allergies.     colchicine 0.6 MG tablet Take 1 tablet (0.6 mg total) by mouth 2 (two) times daily. 60 tablet 2   diclofenac Sodium (VOLTAREN) 1 % GEL Apply 1 application topically 4 (four) times daily as needed (pain).     DULoxetine (CYMBALTA) 60 MG capsule Take 60 mg by mouth every evening.      empagliflozin (JARDIANCE) 10 MG TABS tablet Take 1 tablet (10 mg total) by mouth daily before breakfast. 30 tablet 11   EPINEPHrine 0.3 mg/0.3 mL IJ SOAJ injection Inject 0.3 mg into the muscle daily as needed (for anaphylaxis).      furosemide (LASIX) 40 MG tablet Take 1 tablet (40 mg total) by mouth daily. 90 tablet 3   glipiZIDE (GLUCOTROL) 10 MG tablet Take 10 mg by mouth in the morning and at bedtime.     isosorbide mononitrate (IMDUR) 30 MG 24 hr tablet Take 1 tablet (30 mg total) by mouth daily. 30 tablet 2   isosorbide mononitrate (IMDUR) 30 MG 24 hr tablet Take 1 tablet (30 mg total) by mouth daily. 30 tablet 2   methadone (DOLOPHINE) 5 MG tablet Take 5 mg by mouth every other day. For severe chronic  pain     metoprolol succinate (TOPROL XL) 25 MG 24 hr tablet Take 1 tablet (25 mg total) by mouth daily. 30 tablet 3   metoprolol succinate (TOPROL XL) 25 MG 24 hr tablet Take 1 tablet (25 mg total) by mouth daily. 30 tablet 2   omeprazole (PRILOSEC) 40 MG capsule Take 40 mg by mouth daily before breakfast.     oxyCODONE (OXY IR/ROXICODONE) 5 MG immediate release tablet Take 5 mg by mouth 4 (four) times daily as needed (severe chronic pain).      sacubitril-valsartan (ENTRESTO) 97-103 MG Take 1 tablet by mouth 2 (two) times daily. 180 tablet 3   tamsulosin (FLOMAX) 0.4 MG CAPS capsule Take 0.4 mg by mouth at bedtime.     tiZANidine (ZANAFLEX) 4 MG tablet Take 4 mg by mouth at bedtime.     Vibegron 75 MG TABS Take 75 mg by mouth daily.     No facility-administered medications prior to visit.   Allergies  Allergen Reactions  Wasp Venom Anaphylaxis, Itching and Swelling     Objective:   Today's Vitals   05/28/22 1008  Temp: (!) 97.4 F (36.3 C)  TempSrc: Temporal  Weight: 217 lb 12.8 oz (98.8 kg)  Height: _0  (1.803 m)   Body mass index is 30.38 kg/m.   General: Well developed, well nourished. No acute distress. Extremities: No edema noted. Psych: Alert and oriented. Normal mood and affect.  Health Maintenance Due  Topic Date Due   Diabetic kidney evaluation - Urine ACR  Never done   Hepatitis C Screening  Never done   INFLUENZA VACCINE  04/07/2022   Imaging: CT Abdomen w contrast (05/13/2022) IMPRESSION: 1. No acute findings identified within the abdomen. 2. Bilateral kidney lesions are considered indeterminate and incompletely characterized by today's exam. Consider further evaluation with nonemergent contrast enhanced renal protocol MRI. 3. Aortic Atherosclerosis (ICD10-I70.0).  CT Chest Nodule wo contrast IMPRESSION: 1. Interval resolution of previous right lower lobe lung nodule with adjacent scarring and architectural distortion. This is in keeping with a  postinflammatory or infectious process. 2. New 4 mm right lower lobe nodular density (within the same area as the previous nodule.) Likely postinflammatory/ infectious, or an area of mucoid impaction within a dilated peripheral bronchial. No follow-up needed if patient is low-risk. Non-contrast chest CT can be considered in 12 months if patient is high-risk. This recommendation follows the consensus statement: Guidelines for Management of Incidental Pulmonary Nodules Detected on CT Images: From the Fleischner Society 2017; Radiology 2017; 284:228-243. 3. Interval resolution of previous bilateral pleural effusions. 4. Increase caliber of the main pulmonary artery compatible with PA hypertension. 5. Aortic Atherosclerosis (ICD10-I70.0) and Emphysema (ICD10-J43.9). 6. Coronary artery calcifications.    Assessment & Plan:   1. Coronary artery disease involving native coronary artery of native heart with angina pectoris [facility]) Optimizing medical management currently. Continue Entresto 97-103 bid, amlodipine 10 mg daily, a daily aspirin, empagliflozin 10 mg daily, Lasix 40 mg daily, metoprolol 25 mg daily and Imdur 30 mg daily  2. Other specified disorders of kidney and ureter 3. Abnormal CT scan, kidney Recent CT showed indeterminate kidney lesions. I will go ahead nd order an MR of the kidneys with a contrast enhanced renal protocol.  - MR Abdomen W Wo Contrast; Future  4. Incidental pulmonary nodule, > 37m and < 814mCT of chest shows resolution of some former nodules, but also a new 4 mm nodule. We will plan for a repeat in 1 year.  5. Need for influenza vaccination  - Flu Vaccine QUAD High Dose(Fluad)  Return for As scheduled.   StHaydee SalterMD

## 2022-05-29 DIAGNOSIS — M545 Low back pain, unspecified: Secondary | ICD-10-CM | POA: Diagnosis not present

## 2022-06-01 DIAGNOSIS — M545 Low back pain, unspecified: Secondary | ICD-10-CM | POA: Diagnosis not present

## 2022-06-03 DIAGNOSIS — M79671 Pain in right foot: Secondary | ICD-10-CM | POA: Diagnosis not present

## 2022-06-03 DIAGNOSIS — M545 Low back pain, unspecified: Secondary | ICD-10-CM | POA: Diagnosis not present

## 2022-06-03 DIAGNOSIS — M79672 Pain in left foot: Secondary | ICD-10-CM | POA: Diagnosis not present

## 2022-06-03 DIAGNOSIS — E119 Type 2 diabetes mellitus without complications: Secondary | ICD-10-CM | POA: Diagnosis not present

## 2022-06-03 DIAGNOSIS — B351 Tinea unguium: Secondary | ICD-10-CM | POA: Diagnosis not present

## 2022-06-04 ENCOUNTER — Ambulatory Visit: Payer: Medicare Other

## 2022-06-04 DIAGNOSIS — I110 Hypertensive heart disease with heart failure: Secondary | ICD-10-CM

## 2022-06-04 DIAGNOSIS — I739 Peripheral vascular disease, unspecified: Secondary | ICD-10-CM | POA: Diagnosis not present

## 2022-06-04 DIAGNOSIS — I200 Unstable angina: Secondary | ICD-10-CM

## 2022-06-04 DIAGNOSIS — R931 Abnormal findings on diagnostic imaging of heart and coronary circulation: Secondary | ICD-10-CM

## 2022-06-04 DIAGNOSIS — I359 Nonrheumatic aortic valve disorder, unspecified: Secondary | ICD-10-CM | POA: Diagnosis not present

## 2022-06-04 DIAGNOSIS — I10 Essential (primary) hypertension: Secondary | ICD-10-CM | POA: Diagnosis not present

## 2022-06-10 ENCOUNTER — Ambulatory Visit (INDEPENDENT_AMBULATORY_CARE_PROVIDER_SITE_OTHER): Payer: Medicare Other | Admitting: Orthopedic Surgery

## 2022-06-10 DIAGNOSIS — I200 Unstable angina: Secondary | ICD-10-CM

## 2022-06-10 DIAGNOSIS — M79605 Pain in left leg: Secondary | ICD-10-CM

## 2022-06-10 DIAGNOSIS — M546 Pain in thoracic spine: Secondary | ICD-10-CM | POA: Diagnosis not present

## 2022-06-12 ENCOUNTER — Encounter: Payer: Self-pay | Admitting: Orthopedic Surgery

## 2022-06-12 ENCOUNTER — Ambulatory Visit
Admission: RE | Admit: 2022-06-12 | Discharge: 2022-06-12 | Disposition: A | Discharge status: Home / Self Care | Payer: Medicare Other | Source: Ambulatory Visit | Attending: Family Medicine | Admitting: Family Medicine

## 2022-06-12 DIAGNOSIS — N2889 Other specified disorders of kidney and ureter: Secondary | ICD-10-CM

## 2022-06-12 DIAGNOSIS — R93429 Abnormal radiologic findings on diagnostic imaging of unspecified kidney: Secondary | ICD-10-CM

## 2022-06-12 MED ORDER — GADOPICLENOL 0.5 MMOL/ML IV SOLN
10.0000 mL | Freq: Once | INTRAVENOUS | Status: AC | PRN
  Administered 2022-06-12: 10 mL via INTRAVENOUS

## 2022-06-12 NOTE — Progress Notes (Signed)
Office Visit Note   Patient: Thomas [name]           Date of Birth: [birth year]           MRN: [id number] Visit Date: 06/10/2022 Requested by: Haydee Salter, MD [place],  [place] [id number] PCP: Haydee Salter, MD  Subjective: Chief Complaint  Patient presents with   Left Knee - Follow-up    HPI: Thomas [name] is a 76 y.o. male who presents to the office .  Reporting left knee and left leg pain as well as low back pain.  He has been ambulating with a cane.  He did have a left knee injection 04/27/2022 which gave him about 30% relief for 2 days.  He also has some stomach and kidney problems.  He did have an epidural steroid injection with Dr. Ernestina Patches which he states "helped some".  He has a cardiology appointment to evaluate for possible stent placement.  His pain in that left leg is definitely radicular in nature.  He takes oxycodone from the [place] for pain management.  Last clinic visit I injected the knee with the decision making of great result from the injection consider knee replacement marginal result from the injection consider further work-up of the back.  Last MRI scan a year ago demonstrated multilevel degenerative disc facet arthrosis and a sigmoid shaped curvature to the thoracic the lumbar spine.  He has moderate to severe spinal canal and right lateral recess narrowing as well as moderate left lateral recess narrowing at L3-4.  Those are the most significant findings on the left-hand side.              ROS: All systems reviewed are negative as they relate to the chief complaint within the history of present illness.  Patient denies fevers or chills.  Assessment & Plan: Visit Diagnoses:  1. Acute left-sided thoracic back pain   2. Pain in left leg     Plan: Impression is marginal result from left knee injection.  Symptoms today are primarily radicular.  Has a lot of other medical issues going on.  While those are being worked out I think repeat L-spine MRI  is indicated to evaluate for left-sided radiculopathy as a preoperative type of study.  Since he is ready for surgical intervention I would have him evaluated by Dr. Laurance Flatten first.  I have seen some instances where the knee replacement is done before the back in order to allow for ambulation as part of the rehabilitative process.  However his pain is so severe that he may want to consider back surgery first prior to left knee replacement.  We will see what Dr. Laurance Flatten thinks about the timing of any intervention if he thinks it is indicated.  Follow-Up Instructions: No follow-ups on file.   Orders:  Orders Placed This Encounter  Procedures   MR Lumbar Spine w/o contrast   Ambulatory referral to Orthopedic Surgery   No orders of the defined types were placed in this encounter.     Procedures: No procedures performed   Clinical Data: No additional findings.  Objective: Vital Signs: There were no vitals taken for this visit.  Physical Exam:  Constitutional: Patient appears well-developed HEENT:  Head: Normocephalic Eyes:EOM are normal Neck: Normal range of motion Cardiovascular: Normal rate Pulmonary/chest: Effort normal Neurologic: Patient is alert Skin: Skin is warm Psychiatric: Patient has normal mood and affect  Ortho Exam: Ortho exam demonstrates no atrophy in either leg.  He does have some nerve root changes on the left compared to the right.  Pedal pulses palpable.  Ankle dorsiflexion 5+ out of 5 bilaterally.  No groin pain with internal/external Tatian of the leg.  No left knee effusion is present.  Has medial greater than lateral joint line tenderness with mild patellofemoral crepitus bilaterally.  Mild pain with forward lateral bending.  Specialty Comments:  STUDY: MRI of the lumbar spine without intravenous contrast performed on 05/13/2021 5:36 PM.   COMPARISON: No comparison available.   TECHNIQUE: Multiplanar, multisequence MR imaging obtained through the lumbar spine  without contrast on 05/13/2021 5:36 PM.   CONTRAST: None.   FINDINGS:  #  Osseous structures: Anterolateral osteophytes noted at multiple levels. There is a Schmorl's nodes in the superior endplates of the K09 and L5 vertebral bodies. Modic type II changes seen in superior plate of the F81 vertebral body. No evidence of an acute fracture.  #  Alignment:There is a sigmoid shape to the thoracolumbar spine. Grade 1 retrolisthesis at L2-L3.  #  Conus medullaris/cauda equina: Spinal cord signal is normal and terminates at L1-L2. The cauda equina is unremarkable.   #  Lower thoracic spine: Disc desiccation with a small posterior central disc protrusion effacing the ventral thecal sac at T11-T12. Neuroforamen are patent. This is viewed on the sagittal images only.   #  T12-L1: Disc desiccation without a significant disc herniation, spinal canal, or neuroforaminal compromise. This is viewed on the sagittal images only.  #  L1-L2: Disc desiccation with moderate loss of disc height and a disc bulge. Superimposed central disc extrusion migrating caudally appreciated. Mild facet hypertrophy. This results in moderate spinal canal and mild lateral recess and mild neuroforaminal stenosis.  #  L2-L3: Disc desiccation with moderate loss of disc height and a disc bulge. Posterior lateral osteophyte on the LEFT. Bilateral facet hypertrophy. This produces moderate spinal canal/lateral recess and mild neuroforaminal stenosis.  #  L3-L4: Disc desiccation with moderate loss of disc height and a disc bulge. Superimposed central disc protrusion noted. Posterior marginal osteophytes appreciated. Bilateral facet hypertrophy with ligamentum flavum redundancy on the LEFT. This is producing moderate to severe spinal canal/RIGHT lateral recess narrowing. Moderate LEFT lateral recess narrowing. Mild bilateral neuroforaminal stenosis.  #  L4-L5: Disc desiccation with mild loss of disc height and a disc bulge. Posterior marginal  osteophytes appreciated. Bilateral facet hypertrophy and ligamentum flavum redundancy. This results in moderate spinal canal/lateral recess narrowing. Moderate to severe RIGHT and mild LEFT neuroforaminal stenosis.  #  L5-S1: Disc desiccation with severe loss of disc height and a disc bulge. Posterior marginal osteophytes appreciated. Bilateral facet hypertrophy that is greater on the RIGHT. Spinal canal is patent. Moderate bilateral neuroforaminal stenosis.   #  Paraspinal tissues: Unremarkable.   #  Additional comments: None.    IMPRESSION:  1.  Multilevel degenerative disc, facet arthrosis and a sigmoid shape curvature to the thoracolumbar spine, as described above. This is most notable for moderate to severe spinal canal/RIGHT lateral recess narrowing and moderate LEFT lateral recess narrowing with mild bilateral neuroforaminal stenosis at L3-L4.  2.  Moderate spinal canal/lateral recess narrowing and moderate to severe RIGHT and mild LEFT neuroforaminal stenosis at L4-L5.  3.  Moderate spinal canal/lateral recess narrowing and mild neuroforaminal stenosis at L2-L3.  4.  Moderate spinal canal and mild lateral recess narrowing and mild bilateral neuroforaminal stenosis at L1-L2.  5.  Moderate bilateral neuroforaminal stenosis at L5-S1.   Electronically Signed by: [name]  on 05/14/2021 1:50 PM  Imaging: No results found.   PMFS History: Patient Active Problem List   Diagnosis Date Noted   Chronic systolic (congestive) heart failure ([facility]) 05/06/2022   Aortic valve disorder 05/06/2022   Unstable angina ([facility]) 05/06/2022   Regional wall motion abnormality of heart 05/06/2022   PAD (peripheral artery disease) ([facility]) 24/82/5003   Acute systolic heart failure ([facility]) 05/06/2022   Pericardial effusion 12/24/2021   Pleural effusion 11/04/2021   Pulmonary edema 11/04/2021   Incidental pulmonary nodule, > 43m and < 891m02/28/2023   Spinal stenosis at L4-L5 level 10/01/2021   Peripheral  arterial disease ([facility]01/25/2023   Type 2 diabetes mellitus with diabetic neuropathy, unspecified ([facility]01/20/2023   Sensorineural hearing loss, bilateral 09/26/2021   Personal history of methicillin resistant Staphylococcus aureus 09/26/2021   Degeneration of lumbar or lumbosacral intervertebral disc 09/26/2021   Tobacco dependence 09/26/2021   Excessive daytime sleepiness 11/13/2020   Narcotic habituation, continuous ([facility]03/05/2021   At risk for central sleep apnea 11/13/2020   History of claustrophobia 11/13/2020   Obesity (BMI 30.0-34.9) 11/13/2020   Nocturia 11/13/2020   PTSD (post-traumatic stress disorder) 11/13/2020   Cognitive and behavioral changes 12/28/2019   Essential hypertension    Acute metabolic encephalopathy    Coronary artery disease 02/11/2018   Claudication in peripheral vascular disease ([facility]06/03/2018   Atypical chest pain 01/12/2018   Coronary artery calcification 01/07/2018   CKD (chronic kidney disease) stage 3, GFR 30-59 ml/min ([facility]) 11/25/2017   Chronic diastolic heart failure ([facility]09/02/2017   Aortic stenosis, mild 05/13/2017   Low back pain 05/12/2017   GERD (gastroesophageal reflux disease) 05/12/2017   Prostate nodule 05/12/2017   Depression 05/12/2017   Hx of colonic polyps 05/12/2017   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/2017   Hyperlipidemia 05/12/2017   Hypertensive heart disease with heart failure ([facility]09/01/2017   Osteoarthritis of left knee 05/12/2017   Sleep apnea 05/12/2017   Actinic keratosis 05/12/2017   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   Seborrheic dermatitis 05/12/2017   Paresthesia 05/12/2017   Past Medical History:  Diagnosis Date   Actinic keratosis 05/12/2017   Back pain    Chronic systolic (congestive) heart failure ([facility]8/30/2023   Depression 05/12/2017   Diabetes ([facility]   GERD (gastroesophageal reflux disease) 05/12/2017   Hx of colonic polyps 05/12/2017   Hyperlipidemia 05/12/2017    Hypertension    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/2017   Low back pain 05/12/2017   Lumbar radiculopathy 05/12/2017   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   Obesity 05/12/2017   Osteoarthritis of knee 05/12/2017   Paresthesia 05/12/2017   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   Prostate nodule 05/12/2017   Right rotator cuff tear 10/01/2021   Seborrheic dermatitis 05/12/2017    Family History  Problem Relation Age of Onset   Hypertension Mother    Hypertension Father    Alzheimer's disease Father    Heart disease Sister    Heart disease Paternal Aunt     Past Surgical History:  Procedure Laterality Date   HAND SURGERY Right    ORIF ANKLE FRACTURE Left 08/15/2007   Distal fibular   REPLACEMENT TOTAL KNEE Right 07/12/2012   RIGHT/LEFT HEART CATH AND CORONARY ANGIOGRAPHY N/A 01/12/2018   Procedure: RIGHT/LEFT HEART CATH AND CORONARY ANGIOGRAPHY;  Surgeon: SmBelva CromeMD;  [facility];  Service: Cardiovascular;  Laterality: N/A;   RIGHT/LEFT HEART CATH AND CORONARY ANGIOGRAPHY N/A 05/19/2022   Procedure: RIGHT/LEFT HEART CATH  AND CORONARY ANGIOGRAPHY;  Surgeon: Nigel Mormon, MD;  Location: [facility];  Service: Cardiovascular;  Laterality: N/A;   SHOULDER SURGERY Right    UMBILICAL HERNIA REPAIR  06/23/2013   Social History   Occupational History   Occupation: retired Nature conservation officer  Tobacco Use   Smoking status: Every Day    Packs/day: 0.50    Years: 55.00    Total pack years: 27.50    Types: Cigarettes    Passive exposure: Current   Smokeless tobacco: Never   Tobacco comments:    Has cut back  Vaping Use   Vaping Use: Never used  Substance and Sexual Activity   Alcohol use: No   Drug use: No    Comment: takes prescriptions as prescribed: oxycodone and methadone   Sexual activity: Never    Birth control/protection: None

## 2022-06-15 ENCOUNTER — Encounter: Payer: Self-pay | Admitting: Family Medicine

## 2022-06-15 DIAGNOSIS — N281 Cyst of kidney, acquired: Secondary | ICD-10-CM | POA: Insufficient documentation

## 2022-06-17 ENCOUNTER — Ambulatory Visit (INDEPENDENT_AMBULATORY_CARE_PROVIDER_SITE_OTHER): Payer: Medicare Other

## 2022-06-17 ENCOUNTER — Encounter: Payer: Self-pay | Admitting: Orthopedic Surgery

## 2022-06-17 ENCOUNTER — Ambulatory Visit (INDEPENDENT_AMBULATORY_CARE_PROVIDER_SITE_OTHER): Payer: Medicare Other | Admitting: Orthopedic Surgery

## 2022-06-17 VITALS — BP 181/84 | HR 51 | Ht 71.0 in | Wt 218.0 lb

## 2022-06-17 DIAGNOSIS — M546 Pain in thoracic spine: Secondary | ICD-10-CM

## 2022-06-17 NOTE — Progress Notes (Addendum)
Orthopedic Spine Surgery Office Note  Assessment: Patient is a 76 y.o. male with low back pain and radicular left leg pain into the buttock and posterior thigh   Plan: -Explained that initially conservative treatment is tried as a significant number of patients may experience relief with these treatment modalities. Discussed that the conservative treatments include:  -activity modification  -physical therapy  -over the counter pain medications  -medrol dosepak  -lumbar steroid injections -Encouraged smoking cessation -Patient has tried physical therapy and injections in the past -An MRI of his lumbar spine has already been ordered by Dr. Marlou Sa.  I recommended he obtain that on the 20th and discussed the results with me the next week.  I explained that I would not perform any surgery on him given his current smoking status.  He may be a candidate for an injection though if there is an area of stenosis that is consistent with his pain -Patient should return to office in 2 weeks, repeat x-rays of lumbar spine at next visit: None   Patient expressed understanding of the plan and all questions were answered to their satisfaction.   ___________________________________________________________________________   History:  Patient is a 76 y.o. male who presents today for lumbar spine.  Patient has had a long history of back pain but then within the last couple of months he has had more left leg symptoms.  He states that he feels the pain in his left buttock and in his left thigh.  The pain in the thigh is in the posterior aspect and the lateral aspect.  It is worse with activity and improves with rest.  He also states it is better if he stands up straight.  Bending over seems to make the pain especially the leg worse.  There is no trauma or injury that brought on this new pain.  No issues with the right leg   Weakness: Yes, sometimes the left leg feels weak on him when he is having pain Symptoms of  imbalance: Yes, when the left leg is weak he has trouble standing steady on that side Paresthesias and numbness: Has decree sensation in his bilateral feet and legs from neuropathy, no new paresthesias or numbness Bowel or bladder incontinence: Yes, has had years of urinary incontinence.  Wife states that it mostly happens at night when wakes up to void but has trouble getting to the bathroom on time Saddle anesthesia: Denies  Treatments tried: Activity modification, steroid injections, physical therapy  Review of systems: Has had pain that wakes him at night going to that left leg.  Denies fevers and chills, night sweats, unexplained weight loss, history of cancer  Past medical history: Hypertension Depression Anxiety Heart failure GERD Diabetes Neuropathy  Allergies: Bee stings  Past surgical history:  Right knee surgery, hernia repair, right thumb surgery, ankle surgery  Social history: Reports use of nicotine product (smoking, vaping, patches, smokeless) -smokes a carton per week Alcohol use: Denies Denies recreational drug use   Physical Exam:  General: no acute distress, appears stated age Neurologic: alert, answering questions appropriately, following commands Respiratory: unlabored breathing on room air, symmetric chest rise Psychiatric: appropriate affect, normal cadence to speech   MSK (spine):  -Strength exam      Left  Right EHL    4/5  4/5 TA    5/5  5/5 GSC    5/5  5/5 Knee extension  5/5  5/5 Hip flexion   5/5  5/5  -Sensory exam    Sensation  intact to light touch in L3-S1 nerve distributions of bilateral lower extremities (decreased in all distributions distal to the knee bilaterally)  -Achilles DTR: 1/4 on the left, 1/4 on the right -Patellar tendon DTR: 1/4 on the left, 1/4 on the right  -Straight leg raise: Negative -Contralateral straight leg raise: Negative -Femoral nerve stretch test: Negative -Clonus: no beats bilaterally -Gait: Slow  but not unsteady -Negative Hoffmann, no hyperreflexia with biceps and brachioradialis reflex testing bilaterally  -Left hip exam: Decreased internal rotation, but no pain through range of motion, negative Corky Sox -Right hip exam: Decreased internal rotation with pain at end of internal rotation range of motion, no pain with external rotation, negative Faber  Imaging: X-ray of the lumbar spine from 06/17/2022 was independently reviewed and interpreted, showing 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

## 2022-06-26 ENCOUNTER — Ambulatory Visit
Admission: RE | Admit: 2022-06-26 | Discharge: 2022-06-26 | Disposition: A | Discharge status: Home / Self Care | Payer: Medicare Other | Source: Ambulatory Visit | Attending: Orthopedic Surgery | Admitting: Orthopedic Surgery

## 2022-06-26 DIAGNOSIS — M545 Low back pain, unspecified: Secondary | ICD-10-CM | POA: Diagnosis not present

## 2022-06-26 DIAGNOSIS — M48061 Spinal stenosis, lumbar region without neurogenic claudication: Secondary | ICD-10-CM | POA: Diagnosis not present

## 2022-06-26 DIAGNOSIS — M4807 Spinal stenosis, lumbosacral region: Secondary | ICD-10-CM | POA: Diagnosis not present

## 2022-06-26 DIAGNOSIS — M79605 Pain in left leg: Secondary | ICD-10-CM

## 2022-06-26 DIAGNOSIS — M4316 Spondylolisthesis, lumbar region: Secondary | ICD-10-CM | POA: Diagnosis not present

## 2022-06-29 ENCOUNTER — Ambulatory Visit (INDEPENDENT_AMBULATORY_CARE_PROVIDER_SITE_OTHER): Payer: Medicare Other | Admitting: Orthopedic Surgery

## 2022-06-29 DIAGNOSIS — M5416 Radiculopathy, lumbar region: Secondary | ICD-10-CM

## 2022-06-29 NOTE — Progress Notes (Signed)
Orthopedic Surgery Progress Note  Assessment: Patient is a 76 y.o. male with lumbar radiculopathy with pain in L4 and L5 distribution on the left side. Has central and lateral recess stenosis at L3/4, central and lateral recess stenosis at L4/5, and foraminal stenosis at L4/5 and L5/S1.   Plan: -Explained that initially conservative treatment is tried as a significant number of patients may experience relief with these treatment modalities. Discussed that the conservative treatments include:  -activity modification  -physical therapy  -over the counter pain medications  -medrol dosepak  -lumbar steroid injections -Patient has tried physical therapy, narcotics, activity modification, injections in the past (have worked previously)  -Patient said he already knew about the renal lesion and would discuss it with his primary care provider. Told me he has had a prior cervical mri -He is still smoking. Explained that he would need to be nicotine free for 8 weeks prior to any surgical intervention should he fail conservative treatment -Patient should return to office in 6 weeks, repeat x-rays of lumbar spine at next visit: none   Patient expressed understanding of the plan and all questions were answered to the patient's satisfaction.   ___________________________________________________________________________  History: Patient is a 76 y.o. male who has been previously seen in the office for symptoms consistent with lumbar radiculopathy. Since the last visit, symptom intensity has remained the same. It is severe. He feels it every day into his left leg. He is not able to do activities he normally enjoys due to the pain. 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 At the last visit, MRI was recommended which he completed. He has not quit smoking. Has some back pain but the pain in the back of his left leg is worse.   COPY of Last note Patient is a 76 y.o. male who  presents today for lumbar spine.  Patient has had a long history of back pain but then within the last couple of months he has had more left leg symptoms.  He states that he feels the pain in his left buttock and in his left thigh.  The pain in the thigh is in the posterior aspect and the lateral aspect.  It is worse with activity and improves with rest.  He also states it is better if he stands up straight.  Bending over seems to make the pain especially the leg worse.  There is no trauma or injury that brought on this new pain.  No issues with the right leg     Weakness: Yes, sometimes the left leg feels weak on him when he is having pain Symptoms of imbalance: Yes, when the left leg is weak he has trouble standing steady on that side Paresthesias and numbness: Has decree sensation in his bilateral feet and legs from neuropathy, no new paresthesias or numbness Bowel or bladder incontinence: Yes, has had years of urinary incontinence.  Wife states that it mostly happens at night when wakes up to void but has trouble getting to the bathroom on time Saddle anesthesia: Denies End of Copy  Previous treatments: narcotics, activity modification, PT  Physical Exam:  General: no acute distress, appears stated age Neurologic: alert, answering questions appropriately, following commands Respiratory: unlabored breathing on room air, symmetric chest rise Psychiatric: appropriate affect, normal cadence to speech   MSK (spine):  -Strength exam      Left  Right EHL    4/5  4/5 TA  5/5  5/5 GSC    5/5  5/5 Knee extension  5/5  5/5 Hip flexion   5/5  5/5  -Sensory exam    Sensation intact to light touch in L3-S1 nerve distributions of bilateral lower extremities  -Achilles DTR: 1/4 on the left, 1/4 on the right -Patellar tendon DTR: 1/4 on the left, 1/4 on the right  Imaging: X-ray of the lumbar spine from 06/17/2022 showed 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 Lateral recess stenosis and disc herniation at L4/5. Lateral recess and central stenosis at L3/4. Lateral recess stenosis at L2/3. Foraminal stenosis at L4/5 and L5/S1.    Patient name: Thomas [name] Patient MRN: [id number] Date of visit: 06/29/22

## 2022-06-29 NOTE — Progress Notes (Signed)
Does he have follow-up with Lance Coon?

## 2022-06-30 ENCOUNTER — Encounter: Payer: Self-pay | Admitting: Family Medicine

## 2022-07-01 ENCOUNTER — Ambulatory Visit: Payer: Medicare Other | Admitting: Internal Medicine

## 2022-07-01 ENCOUNTER — Encounter: Payer: Self-pay | Admitting: Internal Medicine

## 2022-07-01 VITALS — BP 144/63 | HR 52 | Temp 98.3°F | Resp 16 | Ht 71.0 in | Wt 223.2 lb

## 2022-07-01 DIAGNOSIS — I200 Unstable angina: Secondary | ICD-10-CM | POA: Diagnosis not present

## 2022-07-01 DIAGNOSIS — I739 Peripheral vascular disease, unspecified: Secondary | ICD-10-CM | POA: Diagnosis not present

## 2022-07-01 DIAGNOSIS — I10 Essential (primary) hypertension: Secondary | ICD-10-CM | POA: Diagnosis not present

## 2022-07-01 MED ORDER — VARENICLINE TARTRATE (STARTER) 0.5 MG X 11 & 1 MG X 42 PO TBPK: 1.0000 | ORAL_TABLET | Freq: Every day | ORAL | 3 refills | Status: DC

## 2022-07-01 NOTE — Progress Notes (Signed)
Primary Physician/Referring:  Haydee Salter, MD  Patient ID: Thomas [name], male    DOB: 08-04-1946, 76 y.o.   MRN: [id number]  Chief Complaint  Patient presents with   PAD   Follow-up    4 weeks   HPI:    Thomas [name]  is a 76 y.o. male with past medical history significant for hypertension, newly reduced ejection fraction, and unstable angina who is here for a follow-up visit.  Patient states that he has is still having chest pain.  Despite optimizing antianginal therapy patient is still complaining of chest pressure, shortness of breath, and dyspnea on exertion.  His legs are also very painful and patient complains of severe claudication.  He does not know which is worse his chest pain or his leg pain.  Plan has been if patient still had unstable angina despite optimal therapy, will reschedule him for cardiac catheterization with plan for intervention.  Patient will be placed on Cath Lab schedule next week and at that time a peripheral angiogram will be done as well.  No intervention will be performed on his legs at that time, however, patient will likely be scheduled to come back for peripheral intervention.  His lower extremity arterial ultrasound did show severe peripheral arterial disease and patient states both legs hurt equally.  He is not a good candidate for Pletal given slightly reduced ejection fraction.  Patient is asking for Chantix to quit smoking, prescription has been sent.  Discussed the importance of medication compliance.  Patient agreeable to current plan.   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   Obesity 05/12/2017   Osteoarthritis of knee 05/12/2017   Paresthesia 05/12/2017   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   Prostate nodule 05/12/2017   Right rotator cuff tear 10/01/2021   Seborrheic dermatitis 05/12/2017   Past Surgical History:  Procedure Laterality Date   HAND SURGERY Right    ORIF ANKLE FRACTURE Left 08/15/2007   Distal fibular   REPLACEMENT TOTAL KNEE Right 07/12/2012   RIGHT/LEFT HEART CATH AND CORONARY ANGIOGRAPHY N/A 01/12/2018   Procedure: RIGHT/LEFT HEART CATH AND CORONARY ANGIOGRAPHY;  Surgeon: Belva Crome, MD;  Location: [facility];  Service: Cardiovascular;  Laterality: N/A;   RIGHT/LEFT HEART CATH AND CORONARY ANGIOGRAPHY N/A 05/19/2022   Procedure: RIGHT/LEFT HEART CATH AND CORONARY ANGIOGRAPHY;  Surgeon: Nigel Mormon, MD;  Location: [facility];  Service: Cardiovascular;  Laterality: N/A;   SHOULDER SURGERY Right    UMBILICAL HERNIA REPAIR  06/23/2013   Family History  Problem Relation Age of Onset   Hypertension Mother    Hypertension Father    Alzheimer's disease Father    Heart disease Sister    Heart disease Paternal Aunt     Social History   Tobacco Use   Smoking status: Every Day    Packs/day: 0.50    Years: 55.00    Total pack years: 27.50    Types: Cigarettes    Passive exposure: Current   Smokeless tobacco: Never   Tobacco comments:    Has cut  back  Substance Use Topics   Alcohol use: No   Marital Status: Married  ROS  Review of Systems  Cardiovascular:  Positive for chest pain, claudication and dyspnea on exertion.  All other systems reviewed and are negative.  Objective  Blood pressure (!) 144/63, pulse (!) 52, temperature 98.3 F (36.8 C), temperature source Temporal, resp. rate 16, height _0  (1.803 m), weight 223 lb 3.2 oz (101.2 kg), SpO2 97 %. Body mass index is 31.13 kg/m.     07/01/2022   12:58 PM 07/01/2022   12:56 PM 06/17/2022    9:12 AM  Vitals with BMI  Height  5'  11" _1   Weight  223 lbs 3 oz 218 lbs  BMI  15.72 62.03  Systolic 559 741 638  Diastolic 63 74 84  Pulse 52 44 51     Physical Exam Vitals and nursing note reviewed.  Constitutional:      Appearance: Normal appearance.  HENT:     Head: Normocephalic and atraumatic.  Neck:     Vascular: No carotid bruit.  Cardiovascular:     Rate and Rhythm: Normal rate and regular rhythm.     Pulses:          Dorsalis pedis pulses are 0 on the right side and 0 on the left side.       Posterior tibial pulses are 1+ on the right side and 1+ on the left side.     Heart sounds: Murmur heard.     No gallop.  Pulmonary:     Effort: Pulmonary effort is normal.     Breath sounds: Normal breath sounds.  Abdominal:     General: Bowel sounds are normal.     Palpations: Abdomen is soft.  Musculoskeletal:     Right lower leg: No edema.     Left lower leg: No edema.  Skin:    General: Skin is warm and dry.  Neurological:     Mental Status: He is alert.    Medications and allergies   Allergies  Allergen Reactions   Wasp Venom Anaphylaxis, Itching and Swelling     Medication list after today's encounter   Current Outpatient Medications:    amLODipine (NORVASC) 10 MG tablet, Take 10 mg by mouth in the morning. Heart Medication, Disp: , Rfl:    aspirin 81 MG chewable tablet, Chew 81 mg by mouth daily at 12 noon. Heart medication, Disp: , Rfl:    atorvastatin (LIPITOR) 80 MG tablet, Take 40 mg by mouth at bedtime. Heart medication, Disp: , Rfl:    Calcium Carbonate-Vitamin D 600-400 MG-UNIT tablet, Take 1 tablet by mouth 2 (two) times daily., Disp: , Rfl:    cetirizine (ZYRTEC) 10 MG tablet, Take 10 mg by mouth daily as needed for allergies., Disp: , Rfl:    colchicine 0.6 MG tablet, Take 1 tablet (0.6 mg total) by mouth 2 (two) times daily., Disp: 60 tablet, Rfl: 2   diclofenac Sodium (VOLTAREN) 1 % GEL, Apply 1 application topically 4 (four) times daily as needed (pain)., Disp: , Rfl:     DULoxetine (CYMBALTA) 60 MG capsule, Take 60 mg by mouth every evening. , Disp: , Rfl:    empagliflozin (JARDIANCE) 10 MG TABS tablet, Take 1 tablet (10 mg total) by mouth daily before breakfast., Disp: 30 tablet, Rfl: 11   EPINEPHrine 0.3 mg/0.3 mL IJ SOAJ injection, Inject 0.3 mg into the muscle daily as needed (for anaphylaxis). , Disp: , Rfl:  furosemide (LASIX) 40 MG tablet, Take 1 tablet (40 mg total) by mouth daily., Disp: 90 tablet, Rfl: 3   glipiZIDE (GLUCOTROL) 10 MG tablet, Take 10 mg by mouth in the morning and at bedtime., Disp: , Rfl:    isosorbide mononitrate (IMDUR) 30 MG 24 hr tablet, Take 1 tablet (30 mg total) by mouth daily., Disp: 30 tablet, Rfl: 2   methadone (DOLOPHINE) 5 MG tablet, Take 5 mg by mouth every other day. For severe chronic pain, Disp: , Rfl:    metoprolol succinate (TOPROL XL) 25 MG 24 hr tablet, Take 1 tablet (25 mg total) by mouth daily., Disp: 30 tablet, Rfl: 3   omeprazole (PRILOSEC) 40 MG capsule, Take 40 mg by mouth daily before breakfast., Disp: , Rfl:    oxyCODONE (OXY IR/ROXICODONE) 5 MG immediate release tablet, Take 5 mg by mouth 4 (four) times daily as needed (severe chronic pain). , Disp: , Rfl:    sacubitril-valsartan (ENTRESTO) 97-103 MG, Take 1 tablet by mouth 2 (two) times daily., Disp: 180 tablet, Rfl: 3   tamsulosin (FLOMAX) 0.4 MG CAPS capsule, Take 0.4 mg by mouth at bedtime., Disp: , Rfl:    tiZANidine (ZANAFLEX) 4 MG tablet, Take 4 mg by mouth at bedtime., Disp: , Rfl:    Varenicline Tartrate, Starter, (CHANTIX STARTING MONTH PAK) 0.5 MG X 11 & 1 MG X 42 TBPK, Take 1 tablet by mouth daily., Disp: 30 each, Rfl: 3   Vibegron 75 MG TABS, Take 75 mg by mouth daily., Disp: , Rfl:   Laboratory examination:   Lab Results  Component Value Date   NA 139 05/19/2022   K 4.0 05/19/2022   CO2 29 04/28/2022   GLUCOSE 98 04/28/2022   BUN 13 04/28/2022   CREATININE 1.04 04/28/2022   CALCIUM 9.5 04/28/2022   EGFR 40 (L) 03/09/2022   GFRNONAA 38  (L) 02/20/2022       Latest Ref Rng & Units 05/19/2022    8:38 AM 05/19/2022    8:37 AM 04/28/2022   10:41 AM  CMP  Glucose 70 - 99 mg/dL   98   BUN 6 - 23 mg/dL   13   Creatinine 0.40 - 1.50 mg/dL   1.04   Sodium 135 - 145 mmol/L 139  139  138   Potassium 3.5 - 5.1 mmol/L 4.0  4.0  4.1   Chloride 96 - 112 mEq/L   99   CO2 19 - 32 mEq/L   29   Calcium 8.4 - 10.5 mg/dL   9.5   Total Protein 6.0 - 8.3 g/dL   7.6   Total Bilirubin 0.2 - 1.2 mg/dL   0.8   Alkaline Phos 39 - 117 U/L   88   AST 0 - 37 U/L   19   ALT 0 - 53 U/L   17       Latest Ref Rng & Units 05/19/2022    8:38 AM 05/19/2022    8:37 AM 04/28/2022   10:41 AM  CBC  WBC 4.0 - 10.5 K/uL   13.2   Hemoglobin 13.0 - 17.0 g/dL 11.2  11.6  14.3   Hematocrit 39.0 - 52.0 % 33.0  34.0  42.3   Platelets 150.0 - 400.0 K/uL   302.0     Lipid Panel Recent Labs    04/28/22 1041  CHOL 234*  TRIG 180.0*  LDLCALC 161*  VLDL 36.0  HDL 37.80*  CHOLHDL 6    HEMOGLOBIN A1C Lab Results  Component Value Date   HGBA1C 7.0 (H) 04/28/2022   MPG 202.99 12/11/2019   TSH No results for input(s): "TSH" in the last 8760 hours.  External labs:     Radiology:    Cardiac Studies:    Lower Extremity Arterial Duplex 06/03/2022:  Moderate velocity increase at the right mid superficial femoral artery  suggests > 50% stenosis. Left proximal popliteal artery with dampened  monophasic waveform suggests probable left distal SFA hemodynamically  significant stenosis. Moderate velocity increase at the left proximal  posterior tibial artery suggests >50% stenosis.  This exam reveals moderately decreased perfusion of the right lower  extremity, noted at the post tibial artery level (ABI 0.69).    This exam reveals moderately decreased perfusion of the left lower  extremity, noted at the dorsalis pedis and post tibial artery level (ABI  0.71).    Echo 02/2022 reviewed by me, agree with EF 45-50% however there is anterolateral wall  motion abnormality. All of which is new.  05/19/22 heart catheterization LM: Long vessel. 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 Mid RCA lesion may well be obstructive during stress. Recommend further optimization of medical anti anginal therapy at this time. If no improvement in true angina symptoms, could consider repeat cath through femoral access, RFR, and if positive-atherectomy and PCI to mid RCA.   EKG:   05/06/22:  Sinus Rhythm -occasional PAC. LAFB and LVH present. Possible lateral ischemia   Assessment     ICD-10-CM   1. Unstable angina (HCC)  I20.0     2. PAD (peripheral artery disease) (HCC)  I73.9     3. Essential hypertension  I10        No orders of the defined types were placed in this encounter.   Meds ordered this encounter  Medications   Varenicline Tartrate, Starter, (CHANTIX STARTING MONTH PAK) 0.5 MG X 11 & 1 MG X 42 TBPK    Sig: Take 1 tablet by mouth daily.    Dispense:  30 each    Refill:  3     Medications Discontinued During This Encounter  Medication Reason   isosorbide mononitrate (IMDUR) 30 MG 24 hr tablet    metoprolol succinate (TOPROL XL) 25 MG 24 hr tablet      Recommendations:   Thomas [name] is a 76 y.o.  male with newly reduced EF with regional wall motion abnormality and unstable angina s/p LHC on September 12  Unstable  angina [facility]) Patient still very symptomatic despite optimal antianginal therapy Will schedule for LHC with intervention next Tuesday 10/31 with Dr Virgina Jock   PAD (peripheral artery disease) ([place]) Bilateral LE arterial u/s grossly positive for BL PAD EF slightly reduced, would not add pletal at this time Will do  peripheral angiogram on 10/31 after coronaries intervened on Plan to bring patient back for peripheral intervention per Dr Virgina Jock   Essential hypertension Continue current cardiac medications. Encourage low-sodium diet, less than 2000 mg daily.   Follow-up in 1-2 months or sooner if needed.     Floydene Flock, DO, [facility]  07/01/2022, 2:04 PM Office: [phone] Pager: [phone]

## 2022-07-07 ENCOUNTER — Inpatient Hospital Stay (HOSPITAL_COMMUNITY)
Admission: RE | Admit: 2022-07-07 | Discharge: 2022-07-10 | DRG: 322 | Disposition: A | Discharge status: Rehabilitation Facility | Payer: Medicare Other | Source: Ambulatory Visit | Attending: Cardiology | Admitting: Cardiology

## 2022-07-07 ENCOUNTER — Encounter (HOSPITAL_COMMUNITY): Payer: Self-pay | Admitting: Cardiology

## 2022-07-07 ENCOUNTER — Other Ambulatory Visit: Payer: Self-pay

## 2022-07-07 ENCOUNTER — Inpatient Hospital Stay (HOSPITAL_COMMUNITY)
Admission: RE | Disposition: A | Discharge status: Rehabilitation Facility | Payer: Self-pay | Source: Ambulatory Visit | Attending: Cardiology

## 2022-07-07 ENCOUNTER — Other Ambulatory Visit (HOSPITAL_COMMUNITY): Payer: Self-pay

## 2022-07-07 ENCOUNTER — Other Ambulatory Visit: Payer: Self-pay | Admitting: Cardiology

## 2022-07-07 DIAGNOSIS — Z820 Family history of epilepsy and other diseases of the nervous system: Secondary | ICD-10-CM | POA: Diagnosis not present

## 2022-07-07 DIAGNOSIS — I6310 Cerebral infarction due to embolism of unspecified precerebral artery: Secondary | ICD-10-CM | POA: Diagnosis not present

## 2022-07-07 DIAGNOSIS — Z87892 Personal history of anaphylaxis: Secondary | ICD-10-CM | POA: Diagnosis not present

## 2022-07-07 DIAGNOSIS — M5442 Lumbago with sciatica, left side: Secondary | ICD-10-CM | POA: Diagnosis not present

## 2022-07-07 DIAGNOSIS — I2723 Pulmonary hypertension due to lung diseases and hypoxia: Secondary | ICD-10-CM | POA: Diagnosis not present

## 2022-07-07 DIAGNOSIS — I97820 Postprocedural cerebrovascular infarction during cardiac surgery: Secondary | ICD-10-CM | POA: Diagnosis not present

## 2022-07-07 DIAGNOSIS — I639 Cerebral infarction, unspecified: Secondary | ICD-10-CM | POA: Diagnosis not present

## 2022-07-07 DIAGNOSIS — E1142 Type 2 diabetes mellitus with diabetic polyneuropathy: Secondary | ICD-10-CM | POA: Diagnosis present

## 2022-07-07 DIAGNOSIS — E669 Obesity, unspecified: Secondary | ICD-10-CM | POA: Diagnosis not present

## 2022-07-07 DIAGNOSIS — Z6831 Body mass index (BMI) 31.0-31.9, adult: Secondary | ICD-10-CM | POA: Diagnosis not present

## 2022-07-07 DIAGNOSIS — Z7984 Long term (current) use of oral hypoglycemic drugs: Secondary | ICD-10-CM

## 2022-07-07 DIAGNOSIS — I130 Hypertensive heart and chronic kidney disease with heart failure and stage 1 through stage 4 chronic kidney disease, or unspecified chronic kidney disease: Secondary | ICD-10-CM | POA: Diagnosis present

## 2022-07-07 DIAGNOSIS — I2510 Atherosclerotic heart disease of native coronary artery without angina pectoris: Secondary | ICD-10-CM | POA: Diagnosis not present

## 2022-07-07 DIAGNOSIS — I70219 Atherosclerosis of native arteries of extremities with intermittent claudication, unspecified extremity: Secondary | ICD-10-CM | POA: Diagnosis not present

## 2022-07-07 DIAGNOSIS — I110 Hypertensive heart disease with heart failure: Secondary | ICD-10-CM | POA: Diagnosis present

## 2022-07-07 DIAGNOSIS — Z8249 Family history of ischemic heart disease and other diseases of the circulatory system: Secondary | ICD-10-CM

## 2022-07-07 DIAGNOSIS — I10 Essential (primary) hypertension: Secondary | ICD-10-CM | POA: Diagnosis not present

## 2022-07-07 DIAGNOSIS — G608 Other hereditary and idiopathic neuropathies: Secondary | ICD-10-CM | POA: Diagnosis present

## 2022-07-07 DIAGNOSIS — M109 Gout, unspecified: Secondary | ICD-10-CM | POA: Diagnosis present

## 2022-07-07 DIAGNOSIS — I69354 Hemiplegia and hemiparesis following cerebral infarction affecting left non-dominant side: Secondary | ICD-10-CM | POA: Diagnosis present

## 2022-07-07 DIAGNOSIS — R29810 Facial weakness: Secondary | ICD-10-CM | POA: Diagnosis not present

## 2022-07-07 DIAGNOSIS — M5416 Radiculopathy, lumbar region: Secondary | ICD-10-CM | POA: Diagnosis present

## 2022-07-07 DIAGNOSIS — Z7982 Long term (current) use of aspirin: Secondary | ICD-10-CM | POA: Diagnosis not present

## 2022-07-07 DIAGNOSIS — I5022 Chronic systolic (congestive) heart failure: Secondary | ICD-10-CM | POA: Diagnosis not present

## 2022-07-07 DIAGNOSIS — Z79899 Other long term (current) drug therapy: Secondary | ICD-10-CM

## 2022-07-07 DIAGNOSIS — I69392 Facial weakness following cerebral infarction: Secondary | ICD-10-CM | POA: Diagnosis not present

## 2022-07-07 DIAGNOSIS — E1169 Type 2 diabetes mellitus with other specified complication: Secondary | ICD-10-CM | POA: Diagnosis not present

## 2022-07-07 DIAGNOSIS — I25110 Atherosclerotic heart disease of native coronary artery with unstable angina pectoris: Principal | ICD-10-CM | POA: Diagnosis present

## 2022-07-07 DIAGNOSIS — E785 Hyperlipidemia, unspecified: Secondary | ICD-10-CM | POA: Diagnosis present

## 2022-07-07 DIAGNOSIS — E1122 Type 2 diabetes mellitus with diabetic chronic kidney disease: Secondary | ICD-10-CM | POA: Diagnosis present

## 2022-07-07 DIAGNOSIS — E1151 Type 2 diabetes mellitus with diabetic peripheral angiopathy without gangrene: Secondary | ICD-10-CM | POA: Diagnosis not present

## 2022-07-07 DIAGNOSIS — R202 Paresthesia of skin: Secondary | ICD-10-CM | POA: Diagnosis not present

## 2022-07-07 DIAGNOSIS — G8194 Hemiplegia, unspecified affecting left nondominant side: Secondary | ICD-10-CM | POA: Diagnosis not present

## 2022-07-07 DIAGNOSIS — R29704 NIHSS score 4: Secondary | ICD-10-CM | POA: Diagnosis not present

## 2022-07-07 DIAGNOSIS — I350 Nonrheumatic aortic (valve) stenosis: Secondary | ICD-10-CM | POA: Diagnosis not present

## 2022-07-07 DIAGNOSIS — F17210 Nicotine dependence, cigarettes, uncomplicated: Secondary | ICD-10-CM | POA: Diagnosis present

## 2022-07-07 DIAGNOSIS — I429 Cardiomyopathy, unspecified: Secondary | ICD-10-CM | POA: Diagnosis present

## 2022-07-07 DIAGNOSIS — I70213 Atherosclerosis of native arteries of extremities with intermittent claudication, bilateral legs: Secondary | ICD-10-CM | POA: Diagnosis present

## 2022-07-07 DIAGNOSIS — K219 Gastro-esophageal reflux disease without esophagitis: Secondary | ICD-10-CM | POA: Diagnosis present

## 2022-07-07 DIAGNOSIS — Z96651 Presence of right artificial knee joint: Secondary | ICD-10-CM | POA: Diagnosis present

## 2022-07-07 DIAGNOSIS — F32A Depression, unspecified: Secondary | ICD-10-CM | POA: Diagnosis present

## 2022-07-07 DIAGNOSIS — G319 Degenerative disease of nervous system, unspecified: Secondary | ICD-10-CM | POA: Diagnosis not present

## 2022-07-07 DIAGNOSIS — Z955 Presence of coronary angioplasty implant and graft: Principal | ICD-10-CM

## 2022-07-07 DIAGNOSIS — G4700 Insomnia, unspecified: Secondary | ICD-10-CM | POA: Diagnosis not present

## 2022-07-07 DIAGNOSIS — I6340 Cerebral infarction due to embolism of unspecified cerebral artery: Secondary | ICD-10-CM | POA: Diagnosis not present

## 2022-07-07 DIAGNOSIS — Y831 Surgical operation with implant of artificial internal device as the cause of abnormal reaction of the patient, or of later complication, without mention of misadventure at the time of the procedure: Secondary | ICD-10-CM | POA: Diagnosis not present

## 2022-07-07 DIAGNOSIS — G8929 Other chronic pain: Secondary | ICD-10-CM | POA: Diagnosis not present

## 2022-07-07 DIAGNOSIS — I739 Peripheral vascular disease, unspecified: Secondary | ICD-10-CM | POA: Diagnosis not present

## 2022-07-07 DIAGNOSIS — M5441 Lumbago with sciatica, right side: Secondary | ICD-10-CM | POA: Diagnosis not present

## 2022-07-07 DIAGNOSIS — Z91030 Bee allergy status: Secondary | ICD-10-CM

## 2022-07-07 DIAGNOSIS — M545 Low back pain, unspecified: Secondary | ICD-10-CM | POA: Diagnosis present

## 2022-07-07 DIAGNOSIS — R29818 Other symptoms and signs involving the nervous system: Secondary | ICD-10-CM | POA: Diagnosis not present

## 2022-07-07 DIAGNOSIS — I25810 Atherosclerosis of coronary artery bypass graft(s) without angina pectoris: Secondary | ICD-10-CM | POA: Diagnosis not present

## 2022-07-07 DIAGNOSIS — N183 Chronic kidney disease, stage 3 unspecified: Secondary | ICD-10-CM | POA: Diagnosis present

## 2022-07-07 DIAGNOSIS — M1712 Unilateral primary osteoarthritis, left knee: Secondary | ICD-10-CM | POA: Diagnosis present

## 2022-07-07 HISTORY — PX: LEFT HEART CATH AND CORONARY ANGIOGRAPHY: CATH118249

## 2022-07-07 HISTORY — PX: CORONARY PRESSURE/FFR STUDY: CATH118243

## 2022-07-07 HISTORY — PX: CORONARY STENT INTERVENTION: CATH118234

## 2022-07-07 HISTORY — PX: CORONARY ATHERECTOMY: CATH118238

## 2022-07-07 HISTORY — PX: ABDOMINAL AORTOGRAM W/LOWER EXTREMITY: CATH118223

## 2022-07-07 LAB — POCT ACTIVATED CLOTTING TIME
Activated Clotting Time: 287 seconds
Activated Clotting Time: 329 seconds
Activated Clotting Time: 245 seconds
Activated Clotting Time: 696 seconds
Activated Clotting Time: 203 seconds
Activated Clotting Time: 179 seconds

## 2022-07-07 LAB — BASIC METABOLIC PANEL
Anion gap: 8 (ref 5–15)
BUN: 24 mg/dL — ABNORMAL HIGH (ref 8–23)
CO2: 23 mmol/L (ref 22–32)
Calcium: 8.7 mg/dL — ABNORMAL LOW (ref 8.9–10.3)
Chloride: 109 mmol/L (ref 98–111)
Creatinine, Ser: 1.48 mg/dL — ABNORMAL HIGH (ref 0.61–1.24)
GFR, Estimated: 48 mL/min — ABNORMAL LOW (ref >60)
Glucose, Bld: 217 mg/dL — ABNORMAL HIGH (ref 70–99)
Potassium: 4.1 mmol/L (ref 3.5–5.1)
Sodium: 140 mmol/L (ref 135–145)

## 2022-07-07 LAB — CBC
HCT: 39.4 % (ref 39.0–52.0)
Hemoglobin: 13.7 g/dL (ref 13.0–17.0)
MCH: 32.2 pg (ref 26.0–34.0)
MCHC: 34.8 g/dL (ref 30.0–36.0)
MCV: 92.7 fL (ref 80.0–100.0)
Platelets: 240 10*3/uL (ref 150–400)
RBC: 4.25 MIL/uL (ref 4.22–5.81)
RDW: 12.3 % (ref 11.5–15.5)
WBC: 8.6 10*3/uL (ref 4.0–10.5)
nRBC: 0 % (ref 0.0–0.2)

## 2022-07-07 LAB — GLUCOSE, CAPILLARY
Glucose-Capillary: 195 mg/dL — ABNORMAL HIGH (ref 70–99)
Glucose-Capillary: 132 mg/dL — ABNORMAL HIGH (ref 70–99)
Glucose-Capillary: 80 mg/dL (ref 70–99)
Glucose-Capillary: 162 mg/dL — ABNORMAL HIGH (ref 70–99)

## 2022-07-07 SURGERY — ABDOMINAL AORTOGRAM W/LOWER EXTREMITY
Anesthesia: LOCAL

## 2022-07-07 MED ORDER — HEPARIN SODIUM (PORCINE) 1000 UNIT/ML IJ SOLN
INTRAMUSCULAR | Status: DC | PRN
  Administered 2022-07-07: 10000 [IU] via INTRAVENOUS
  Administered 2022-07-07 (×2): 2000 [IU] via INTRAVENOUS

## 2022-07-07 MED ORDER — SODIUM CHLORIDE 0.9 % IV SOLN: INTRAVENOUS | Status: DC

## 2022-07-07 MED ORDER — ASPIRIN 81 MG PO CHEW
81.0000 mg | CHEWABLE_TABLET | Freq: Every day | ORAL | Status: DC
  Administered 2022-07-08 – 2022-07-10 (×3): 81 mg via ORAL
  Filled 2022-07-07 (×3): qty 1

## 2022-07-07 MED ORDER — IOHEXOL 350 MG/ML SOLN
INTRAVENOUS | Status: DC | PRN
  Administered 2022-07-07: 130 mL

## 2022-07-07 MED ORDER — HYDRALAZINE HCL 20 MG/ML IJ SOLN
INTRAMUSCULAR | Status: AC
  Filled 2022-07-07: qty 1

## 2022-07-07 MED ORDER — HEPARIN (PORCINE) IN NACL 1000-0.9 UT/500ML-% IV SOLN
INTRAVENOUS | Status: AC
  Filled 2022-07-07: qty 1000

## 2022-07-07 MED ORDER — TIZANIDINE HCL 4 MG PO TABS
4.0000 mg | ORAL_TABLET | Freq: Every day | ORAL | Status: DC
  Administered 2022-07-07 – 2022-07-09 (×3): 4 mg via ORAL
  Filled 2022-07-07 (×4): qty 1

## 2022-07-07 MED ORDER — ASPIRIN 81 MG PO CHEW: 81.0000 mg | CHEWABLE_TABLET | ORAL | Status: DC

## 2022-07-07 MED ORDER — NITROGLYCERIN 1 MG/10 ML FOR IR/CATH LAB
INTRA_ARTERIAL | Status: DC | PRN
  Administered 2022-07-07: 200 ug via INTRACORONARY
  Administered 2022-07-07: 300 ug via INTRACORONARY
  Administered 2022-07-07: 200 ug via INTRACORONARY

## 2022-07-07 MED ORDER — CLOPIDOGREL BISULFATE 75 MG PO TABS
75.0000 mg | ORAL_TABLET | Freq: Every day | ORAL | 1 refills | Status: DC
  Filled 2022-07-07 – 2022-07-22 (×2): qty 30, 30d supply, fill #0

## 2022-07-07 MED ORDER — NITROGLYCERIN 1 MG/10 ML FOR IR/CATH LAB
INTRA_ARTERIAL | Status: AC
  Filled 2022-07-07: qty 10

## 2022-07-07 MED ORDER — SODIUM CHLORIDE 0.9 % IV SOLN: 250.0000 mL | INTRAVENOUS | Status: DC | PRN

## 2022-07-07 MED ORDER — CLOPIDOGREL BISULFATE 300 MG PO TABS
ORAL_TABLET | ORAL | Status: AC
  Filled 2022-07-07: qty 1

## 2022-07-07 MED ORDER — FENTANYL CITRATE (PF) 100 MCG/2ML IJ SOLN
INTRAMUSCULAR | Status: DC | PRN
  Administered 2022-07-07 (×2): 50 ug via INTRAVENOUS

## 2022-07-07 MED ORDER — HYDRALAZINE HCL 20 MG/ML IJ SOLN
INTRAMUSCULAR | Status: DC | PRN
  Administered 2022-07-07: 10 mg via INTRAVENOUS

## 2022-07-07 MED ORDER — ACETAMINOPHEN 325 MG PO TABS
650.0000 mg | ORAL_TABLET | ORAL | Status: DC | PRN
  Administered 2022-07-09: 650 mg via ORAL
  Filled 2022-07-07: qty 2

## 2022-07-07 MED ORDER — MIRABEGRON ER 25 MG PO TB24
25.0000 mg | ORAL_TABLET | Freq: Every day | ORAL | Status: DC
  Administered 2022-07-07 – 2022-07-10 (×4): 25 mg via ORAL
  Filled 2022-07-07 (×5): qty 1

## 2022-07-07 MED ORDER — ATORVASTATIN CALCIUM 40 MG PO TABS
40.0000 mg | ORAL_TABLET | Freq: Every day | ORAL | Status: DC
  Administered 2022-07-07 – 2022-07-08 (×2): 40 mg via ORAL
  Filled 2022-07-07 (×2): qty 1

## 2022-07-07 MED ORDER — HYDRALAZINE HCL 20 MG/ML IJ SOLN
10.0000 mg | INTRAMUSCULAR | Status: AC | PRN
  Administered 2022-07-07 (×2): 10 mg via INTRAVENOUS

## 2022-07-07 MED ORDER — AMLODIPINE BESYLATE 10 MG PO TABS
10.0000 mg | ORAL_TABLET | Freq: Every morning | ORAL | Status: DC
  Administered 2022-07-08 – 2022-07-10 (×3): 10 mg via ORAL
  Filled 2022-07-07 (×4): qty 1

## 2022-07-07 MED ORDER — LIDOCAINE HCL (PF) 1 % IJ SOLN
INTRAMUSCULAR | Status: AC
  Filled 2022-07-07: qty 30

## 2022-07-07 MED ORDER — COLCHICINE 0.6 MG PO TABS
0.6000 mg | ORAL_TABLET | Freq: Two times a day (BID) | ORAL | Status: DC
  Administered 2022-07-07 – 2022-07-10 (×6): 0.6 mg via ORAL
  Filled 2022-07-07 (×6): qty 1

## 2022-07-07 MED ORDER — SODIUM CHLORIDE 0.9 % IV SOLN: INTRAVENOUS | Status: AC

## 2022-07-07 MED ORDER — MIDAZOLAM HCL 2 MG/2ML IJ SOLN
INTRAMUSCULAR | Status: AC
  Filled 2022-07-07: qty 2

## 2022-07-07 MED ORDER — EMPAGLIFLOZIN 10 MG PO TABS
10.0000 mg | ORAL_TABLET | Freq: Every day | ORAL | Status: DC
  Administered 2022-07-08 – 2022-07-10 (×3): 10 mg via ORAL
  Filled 2022-07-07 (×3): qty 1

## 2022-07-07 MED ORDER — SODIUM CHLORIDE 0.9% FLUSH
3.0000 mL | Freq: Two times a day (BID) | INTRAVENOUS | Status: DC
  Administered 2022-07-08 – 2022-07-10 (×4): 3 mL via INTRAVENOUS

## 2022-07-07 MED ORDER — METHADONE HCL 5 MG PO TABS
5.0000 mg | ORAL_TABLET | ORAL | Status: DC
  Administered 2022-07-08 – 2022-07-10 (×2): 5 mg via ORAL
  Filled 2022-07-07 (×2): qty 1

## 2022-07-07 MED ORDER — METOPROLOL SUCCINATE ER 25 MG PO TB24
25.0000 mg | ORAL_TABLET | Freq: Every day | ORAL | Status: DC
  Administered 2022-07-07 – 2022-07-10 (×4): 25 mg via ORAL
  Filled 2022-07-07 (×4): qty 1

## 2022-07-07 MED ORDER — SODIUM CHLORIDE 0.9% FLUSH: 3.0000 mL | INTRAVENOUS | Status: DC | PRN

## 2022-07-07 MED ORDER — CLOPIDOGREL BISULFATE 75 MG PO TABS
75.0000 mg | ORAL_TABLET | Freq: Every day | ORAL | Status: DC
  Administered 2022-07-08 – 2022-07-10 (×3): 75 mg via ORAL
  Filled 2022-07-07 (×3): qty 1

## 2022-07-07 MED ORDER — INSULIN ASPART 100 UNIT/ML IJ SOLN
0.0000 [IU] | Freq: Three times a day (TID) | INTRAMUSCULAR | Status: DC
  Administered 2022-07-08 (×2): 3 [IU] via SUBCUTANEOUS
  Administered 2022-07-08: 2 [IU] via SUBCUTANEOUS
  Administered 2022-07-09: 3 [IU] via SUBCUTANEOUS
  Administered 2022-07-09: 2 [IU] via SUBCUTANEOUS
  Administered 2022-07-09 – 2022-07-10 (×3): 3 [IU] via SUBCUTANEOUS
  Administered 2022-07-10: 2 [IU] via SUBCUTANEOUS

## 2022-07-07 MED ORDER — HEPARIN SODIUM (PORCINE) 1000 UNIT/ML IJ SOLN
INTRAMUSCULAR | Status: AC
  Filled 2022-07-07: qty 10

## 2022-07-07 MED ORDER — NITROGLYCERIN 0.4 MG SL SUBL
0.4000 mg | SUBLINGUAL_TABLET | SUBLINGUAL | 3 refills | Status: DC | PRN
  Filled 2022-07-07: qty 25, 14d supply, fill #0
  Filled 2022-07-22: qty 25, 7d supply, fill #0

## 2022-07-07 MED ORDER — HEPARIN (PORCINE) IN NACL 1000-0.9 UT/500ML-% IV SOLN
INTRAVENOUS | Status: DC | PRN
  Administered 2022-07-07 (×2): 500 mL

## 2022-07-07 MED ORDER — CLOPIDOGREL BISULFATE 300 MG PO TABS
ORAL_TABLET | ORAL | Status: DC | PRN
  Administered 2022-07-07: 600 mg via ORAL

## 2022-07-07 MED ORDER — DULOXETINE HCL 60 MG PO CPEP
60.0000 mg | ORAL_CAPSULE | Freq: Every evening | ORAL | Status: DC
  Administered 2022-07-07 – 2022-07-10 (×4): 60 mg via ORAL
  Filled 2022-07-07 (×4): qty 1

## 2022-07-07 MED ORDER — SODIUM CHLORIDE 0.9% FLUSH: 3.0000 mL | Freq: Two times a day (BID) | INTRAVENOUS | Status: DC

## 2022-07-07 MED ORDER — FENTANYL CITRATE (PF) 100 MCG/2ML IJ SOLN
INTRAMUSCULAR | Status: AC
  Filled 2022-07-07: qty 2

## 2022-07-07 MED ORDER — ISOSORBIDE MONONITRATE ER 30 MG PO TB24
30.0000 mg | ORAL_TABLET | Freq: Every day | ORAL | Status: DC
  Administered 2022-07-07 – 2022-07-10 (×4): 30 mg via ORAL
  Filled 2022-07-07 (×4): qty 1

## 2022-07-07 MED ORDER — LABETALOL HCL 5 MG/ML IV SOLN: 10.0000 mg | INTRAVENOUS | Status: AC | PRN

## 2022-07-07 MED ORDER — IODIXANOL 320 MG/ML IV SOLN
INTRAVENOUS | Status: DC | PRN
  Administered 2022-07-07: 20 mL

## 2022-07-07 MED ORDER — MIDAZOLAM HCL 2 MG/2ML IJ SOLN
INTRAMUSCULAR | Status: DC | PRN
  Administered 2022-07-07 (×2): 1 mg via INTRAVENOUS

## 2022-07-07 MED ORDER — NITROGLYCERIN IN D5W 200-5 MCG/ML-% IV SOLN: 2.0000 ug/min | INTRAVENOUS | Status: DC

## 2022-07-07 MED ORDER — METHADONE HCL 5 MG PO TABS: 5.0000 mg | ORAL_TABLET | ORAL | Status: DC

## 2022-07-07 MED ORDER — ONDANSETRON HCL 4 MG/2ML IJ SOLN: 4.0000 mg | Freq: Four times a day (QID) | INTRAMUSCULAR | Status: DC | PRN

## 2022-07-07 MED ORDER — OXYCODONE HCL 5 MG PO TABS: 5.0000 mg | ORAL_TABLET | Freq: Four times a day (QID) | ORAL | Status: DC | PRN

## 2022-07-07 MED ORDER — TAMSULOSIN HCL 0.4 MG PO CAPS
0.4000 mg | ORAL_CAPSULE | Freq: Every day | ORAL | Status: DC
  Administered 2022-07-07 – 2022-07-09 (×3): 0.4 mg via ORAL
  Filled 2022-07-07 (×3): qty 1

## 2022-07-07 SURGICAL SUPPLY — 36 items
BALL SAPPHIRE NC24 3.5X18 (BALLOONS) ×1
BALLN SAPPHIRE 3.5X15 (BALLOONS) ×1
BALLN SCOREFLEX 2.50X15 (BALLOONS) ×1
BALLOON SAPPHIRE 3.5X15 (BALLOONS) IMPLANT
BALLOON SAPPHIRE NC24 3.5X18 (BALLOONS) IMPLANT
BALLOON SCOREFLEX 2.50X15 (BALLOONS) IMPLANT
CATH LAUNCHER 6FR 3DRC (CATHETERS) IMPLANT
CATH LAUNCHER 6FR AL.75 (CATHETERS) IMPLANT
CATH LAUNCHER 6FR EBU3.5 (CATHETERS) IMPLANT
CATH LAUNCHER 6FR JR4 (CATHETERS) IMPLANT
CATH OMNI FLUSH 5F 65CM (CATHETERS) IMPLANT
CATH TELEPORT (CATHETERS) IMPLANT
CATH VISTA GUIDE 6FR AR1 (CATHETERS) IMPLANT
CATHETER LAUNCHER 6FR 3DRC (CATHETERS) ×1
CROWN DIAMONDBACK CLASSIC 1.25 (BURR) IMPLANT
GUIDEWIRE PRESSURE X 175 (WIRE) IMPLANT
KIT ANGIASSIST CO2 SYSTEM (KITS) IMPLANT
KIT ENCORE 26 ADVANTAGE (KITS) IMPLANT
KIT HEART LEFT (KITS) ×1 IMPLANT
KIT HEMO VALVE WATCHDOG (MISCELLANEOUS) IMPLANT
KIT MICROPUNCTURE NIT STIFF (SHEATH) IMPLANT
KIT PV (KITS) ×1 IMPLANT
LUBRICANT VIPERSLIDE CORONARY (MISCELLANEOUS) IMPLANT
PACK CARDIAC CATHETERIZATION (CUSTOM PROCEDURE TRAY) ×1 IMPLANT
SHEATH PINNACLE 6F 10CM (SHEATH) IMPLANT
STENT SYNERGY XD 3.50X12 (Permanent Stent) IMPLANT
STENT SYNERGY XD 3.50X24 (Permanent Stent) IMPLANT
SYNERGY XD 3.50X12 (Permanent Stent) ×1 IMPLANT
SYNERGY XD 3.50X24 (Permanent Stent) ×1 IMPLANT
SYR MEDRAD MARK 7 150ML (SYRINGE) ×1 IMPLANT
TRANSDUCER W/STOPCOCK (MISCELLANEOUS) ×1 IMPLANT
TRAY PV CATH (CUSTOM PROCEDURE TRAY) ×1 IMPLANT
TUBING CIL FLEX 10 FLL-RA (TUBING) ×1 IMPLANT
WIRE ASAHI PROWATER 180CM (WIRE) IMPLANT
WIRE EMERALD 3MM-J .035X150CM (WIRE) IMPLANT
WIRE VIPERWIRE COR FLEX .012 (WIRE) IMPLANT

## 2022-07-07 NOTE — H&P (Signed)
OV 07/01/2022 copied for documentation    Primary Physician/Referring:  Haydee Salter, MD  Patient ID: Thomas [name], male    DOB: 12-Jul-1946, 76 y.o.   MRN: [id number]  Chief Complaint  Patient presents with   PAD   Follow-up    4 weeks   HPI:    Thomas [name]  is a 76 y.o. male with past medical history significant for hypertension, newly reduced ejection fraction, and unstable angina who is here for a follow-up visit.  Patient states that he has is still having chest pain.  Despite optimizing antianginal therapy patient is still complaining of chest pressure, shortness of breath, and dyspnea on exertion.  His legs are also very painful and patient complains of severe claudication.  He does not know which is worse his chest pain or his leg pain.  Plan has been if patient still had unstable angina despite optimal therapy, will reschedule him for cardiac catheterization with plan for intervention.  Patient will be placed on Cath Lab schedule next week and at that time a peripheral angiogram will be done as well.  No intervention will be performed on his legs at that time, however, patient will likely be scheduled to come back for peripheral intervention.  His lower extremity arterial ultrasound did show severe peripheral arterial disease and patient states both legs hurt equally.  He is not a good candidate for Pletal given slightly reduced ejection fraction.  Patient is asking for Chantix to quit smoking, prescription has been sent.  Discussed the importance of medication compliance.  Patient agreeable to current plan.   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   Obesity 05/12/2017   Osteoarthritis of knee 05/12/2017   Paresthesia 05/12/2017   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   Prostate nodule 05/12/2017   Right rotator cuff tear 10/01/2021   Seborrheic dermatitis 05/12/2017   Past Surgical History:  Procedure Laterality Date   HAND SURGERY Right    ORIF ANKLE FRACTURE Left 08/15/2007   Distal fibular   REPLACEMENT TOTAL KNEE Right 07/12/2012   RIGHT/LEFT HEART CATH AND CORONARY ANGIOGRAPHY N/A 01/12/2018   Procedure: RIGHT/LEFT HEART CATH AND CORONARY ANGIOGRAPHY;  Surgeon: Belva Crome, MD;  Location: [facility];  Service: Cardiovascular;  Laterality: N/A;   RIGHT/LEFT HEART CATH AND CORONARY ANGIOGRAPHY N/A 05/19/2022   Procedure: RIGHT/LEFT HEART CATH AND CORONARY ANGIOGRAPHY;  Surgeon: Nigel Mormon, MD;  Location: [facility];  Service: Cardiovascular;  Laterality: N/A;   SHOULDER SURGERY Right    UMBILICAL HERNIA REPAIR  06/23/2013   Family History  Problem Relation Age of Onset   Hypertension Mother    Hypertension Father    Alzheimer's disease Father    Heart disease Sister    Heart disease Paternal Aunt     Social History   Tobacco Use   Smoking status: Every Day    Packs/day: 0.50    Years: 55.00    Total pack years: 27.50    Types: Cigarettes    Passive exposure: Current   Smokeless tobacco: Never  Tobacco comments:    Has cut back  Substance Use Topics   Alcohol use: No   Marital Status: Married  ROS  Review of Systems  Cardiovascular:  Positive for chest pain, claudication and dyspnea on exertion.  All other systems reviewed and are negative.  Objective  Blood pressure (!) 144/63, pulse (!) 52, temperature 98.3 F (36.8 C), temperature source Temporal, resp. rate 16, height _0  (1.803 m), weight 223 lb 3.2 oz (101.2 kg), SpO2 97 %. Body mass index is 31.13 kg/m.     07/01/2022   12:58 PM 07/01/2022   12:56 PM 06/17/2022    9:12  AM  Vitals with BMI  Height  _1  _2   Weight  223 lbs 3 oz 218 lbs  BMI  26.71 24.58  Systolic 099 833 825  Diastolic 63 74 84  Pulse 52 44 51     Physical Exam Vitals and nursing note reviewed.  Constitutional:      Appearance: Normal appearance.  HENT:     Head: Normocephalic and atraumatic.  Neck:     Vascular: No carotid bruit.  Cardiovascular:     Rate and Rhythm: Normal rate and regular rhythm.     Pulses:          Dorsalis pedis pulses are 0 on the right side and 0 on the left side.       Posterior tibial pulses are 1+ on the right side and 1+ on the left side.     Heart sounds: Murmur heard.     No gallop.  Pulmonary:     Effort: Pulmonary effort is normal.     Breath sounds: Normal breath sounds.  Abdominal:     General: Bowel sounds are normal.     Palpations: Abdomen is soft.  Musculoskeletal:     Right lower leg: No edema.     Left lower leg: No edema.  Skin:    General: Skin is warm and dry.  Neurological:     Mental Status: He is alert.    Medications and allergies   Allergies  Allergen Reactions   Wasp Venom Anaphylaxis, Itching and Swelling     Medication list after today's encounter   Current Outpatient Medications:    amLODipine (NORVASC) 10 MG tablet, Take 10 mg by mouth in the morning. Heart Medication, Disp: , Rfl:    aspirin 81 MG chewable tablet, Chew 81 mg by mouth daily at 12 noon. Heart medication, Disp: , Rfl:    atorvastatin (LIPITOR) 80 MG tablet, Take 40 mg by mouth at bedtime. Heart medication, Disp: , Rfl:    Calcium Carbonate-Vitamin D 600-400 MG-UNIT tablet, Take 1 tablet by mouth 2 (two) times daily., Disp: , Rfl:    cetirizine (ZYRTEC) 10 MG tablet, Take 10 mg by mouth daily as needed for allergies., Disp: , Rfl:    colchicine 0.6 MG tablet, Take 1 tablet (0.6 mg total) by mouth 2 (two) times daily., Disp: 60 tablet, Rfl: 2   diclofenac Sodium (VOLTAREN) 1 % GEL, Apply 1 application topically 4 (four) times daily as  needed (pain)., Disp: , Rfl:    DULoxetine (CYMBALTA) 60 MG capsule, Take 60 mg by mouth every evening. , Disp: , Rfl:    empagliflozin (JARDIANCE) 10 MG TABS tablet, Take 1 tablet (10 mg total) by mouth daily before breakfast., Disp: 30 tablet, Rfl: 11   EPINEPHrine 0.3 mg/0.3 mL IJ SOAJ injection, Inject 0.3 mg into the muscle daily as needed (for  anaphylaxis). , Disp: , Rfl:    furosemide (LASIX) 40 MG tablet, Take 1 tablet (40 mg total) by mouth daily., Disp: 90 tablet, Rfl: 3   glipiZIDE (GLUCOTROL) 10 MG tablet, Take 10 mg by mouth in the morning and at bedtime., Disp: , Rfl:    isosorbide mononitrate (IMDUR) 30 MG 24 hr tablet, Take 1 tablet (30 mg total) by mouth daily., Disp: 30 tablet, Rfl: 2   methadone (DOLOPHINE) 5 MG tablet, Take 5 mg by mouth every other day. For severe chronic pain, Disp: , Rfl:    metoprolol succinate (TOPROL XL) 25 MG 24 hr tablet, Take 1 tablet (25 mg total) by mouth daily., Disp: 30 tablet, Rfl: 3   omeprazole (PRILOSEC) 40 MG capsule, Take 40 mg by mouth daily before breakfast., Disp: , Rfl:    oxyCODONE (OXY IR/ROXICODONE) 5 MG immediate release tablet, Take 5 mg by mouth 4 (four) times daily as needed (severe chronic pain). , Disp: , Rfl:    sacubitril-valsartan (ENTRESTO) 97-103 MG, Take 1 tablet by mouth 2 (two) times daily., Disp: 180 tablet, Rfl: 3   tamsulosin (FLOMAX) 0.4 MG CAPS capsule, Take 0.4 mg by mouth at bedtime., Disp: , Rfl:    tiZANidine (ZANAFLEX) 4 MG tablet, Take 4 mg by mouth at bedtime., Disp: , Rfl:    Varenicline Tartrate, Starter, (CHANTIX STARTING MONTH PAK) 0.5 MG X 11 & 1 MG X 42 TBPK, Take 1 tablet by mouth daily., Disp: 30 each, Rfl: 3   Vibegron 75 MG TABS, Take 75 mg by mouth daily., Disp: , Rfl:   Laboratory examination:   Lab Results  Component Value Date   NA 139 05/19/2022   K 4.0 05/19/2022   CO2 29 04/28/2022   GLUCOSE 98 04/28/2022   BUN 13 04/28/2022   CREATININE 1.04 04/28/2022   CALCIUM 9.5 04/28/2022   EGFR  40 (L) 03/09/2022   GFRNONAA 38 (L) 02/20/2022       Latest Ref Rng & Units 05/19/2022    8:38 AM 05/19/2022    8:37 AM 04/28/2022   10:41 AM  CMP  Glucose 70 - 99 mg/dL   98   BUN 6 - 23 mg/dL   13   Creatinine 0.40 - 1.50 mg/dL   1.04   Sodium 135 - 145 mmol/L 139  139  138   Potassium 3.5 - 5.1 mmol/L 4.0  4.0  4.1   Chloride 96 - 112 mEq/L   99   CO2 19 - 32 mEq/L   29   Calcium 8.4 - 10.5 mg/dL   9.5   Total Protein 6.0 - 8.3 g/dL   7.6   Total Bilirubin 0.2 - 1.2 mg/dL   0.8   Alkaline Phos 39 - 117 U/L   88   AST 0 - 37 U/L   19   ALT 0 - 53 U/L   17       Latest Ref Rng & Units 05/19/2022    8:38 AM 05/19/2022    8:37 AM 04/28/2022   10:41 AM  CBC  WBC 4.0 - 10.5 K/uL   13.2   Hemoglobin 13.0 - 17.0 g/dL 11.2  11.6  14.3   Hematocrit 39.0 - 52.0 % 33.0  34.0  42.3   Platelets 150.0 - 400.0 K/uL   302.0     Lipid Panel Recent Labs    04/28/22 1041  CHOL 234*  TRIG 180.0*  LDLCALC 161*  VLDL 36.0  HDL 37.80*  CHOLHDL  6    HEMOGLOBIN A1C Lab Results  Component Value Date   HGBA1C 7.0 (H) 04/28/2022   MPG 202.99 12/11/2019   TSH No results for input(s): "TSH" in the last 8760 hours.  External labs:     Radiology:    Cardiac Studies:    Lower Extremity Arterial Duplex 06/03/2022:  Moderate velocity increase at the right mid superficial femoral artery  suggests > 50% stenosis. Left proximal popliteal artery with dampened  monophasic waveform suggests probable left distal SFA hemodynamically  significant stenosis. Moderate velocity increase at the left proximal  posterior tibial artery suggests >50% stenosis.  This exam reveals moderately decreased perfusion of the right lower  extremity, noted at the post tibial artery level (ABI 0.69).    This exam reveals moderately decreased perfusion of the left lower  extremity, noted at the dorsalis pedis and post tibial artery level (ABI  0.71).    Echo 02/2022 reviewed by me, agree with EF 45-50%  however there is anterolateral wall motion abnormality. All of which is new.  05/19/22 heart catheterization LM: Long vessel. 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 Mid RCA lesion may well be obstructive during stress. Recommend further optimization of medical anti anginal therapy at this time. If no improvement in true angina symptoms, could consider repeat cath through femoral access, RFR, and if positive-atherectomy and PCI to mid RCA.   EKG:   05/06/22:  Sinus Rhythm -occasional PAC. LAFB and LVH present. Possible lateral ischemia   Assessment     ICD-10-CM   1. Unstable angina (HCC)  I20.0     2. PAD (peripheral artery disease) (HCC)  I73.9     3. Essential hypertension  I10        No orders of the defined types were placed in this encounter.   Meds ordered this encounter  Medications   Varenicline Tartrate, Starter, (CHANTIX STARTING MONTH PAK) 0.5 MG X 11 & 1 MG X 42 TBPK    Sig: Take 1 tablet by mouth daily.    Dispense:  30 each    Refill:  3     Medications Discontinued During This Encounter  Medication Reason   isosorbide mononitrate (IMDUR) 30 MG 24 hr tablet    metoprolol succinate (TOPROL XL) 25 MG 24 hr tablet      Recommendations:   Thomas [name] is a 76 y.o.  male with newly reduced EF with regional wall motion abnormality and unstable angina  s/p LHC on September 12  Unstable angina [facility]) Patient still very symptomatic despite optimal antianginal therapy Will schedule for LHC with intervention next Tuesday 10/31 with Dr Virgina Jock   PAD (peripheral artery disease) ([place]) Bilateral LE arterial u/s grossly positive for BL PAD EF slightly reduced,  would not add pletal at this time Will do peripheral angiogram on 10/31 after coronaries intervened on Plan to bring patient back for peripheral intervention per Dr Virgina Jock   Essential hypertension Continue current cardiac medications. Encourage low-sodium diet, less than 2000 mg daily.   Follow-up in 1-2 months or sooner if needed.     Floydene Flock, DO, [facility]  07/01/2022, 2:04 PM Office: [phone] Pager: [phone]     Addendum: Prox LAD, mid RCA, OM, all look at least moderately disease with calcification. Will perform RFR to potentially all vessels and preferentially treat LAD with PCI, if RFR positive. If time, radiation, and contrast would allow, will also consider at least diagnostic LE angiogram, given his [place] lifestyle limiting claudication symptoms on medical therapy. Will perform femoral access given radial and subclavian tortuosity previously noted.    Nigel Mormon, MD Pager: [phone] Office: [phone]

## 2022-07-07 NOTE — Progress Notes (Signed)
Patient transferred from cath lab at 1624hrs. Oriented to room and plan of care for shift. Wife at bedside, right groin site level zero.

## 2022-07-07 NOTE — Progress Notes (Addendum)
Sheath pulled at 1510 by Angelica Chessman, RN. Vital signs checked and stable before sheath pull. Sheath was aspirated and removed, pressure held. Vitals were checked every 5 minutes. Site covered with tegaderm and gause. No hematoma and site is level zero. Distal pulses 2+. Vital signs are stable post pull. Education given to patient and bed rest started at 1540.

## 2022-07-07 NOTE — CV Procedure (Signed)
RCA RFR negative LAD prox-mid disease progression to 70% stenosis Successful orbital atherectomy and PCI with overlapping stents Synergy 3.5X24 mm & 3.5X12 mm Limited iliac and bifemoral angiography with CO2.  Full report to follow.   Nigel Mormon, MD Pager: [phone] Office: [phone]

## 2022-07-07 NOTE — Interval H&P Note (Signed)
History and Physical Interval Note:  07/07/2022 9:59 AM  Thomas [name]  has presented today for surgery, with the diagnosis of chest pain, shortness of breath, unstable angina.  The various methods of treatment have been discussed with the patient and family. After consideration of risks, benefits and other options for treatment, the patient has consented to  Procedure(s): ABDOMINAL AORTOGRAM W/LOWER EXTREMITY (N/A) LEFT HEART CATH AND CORONARY ANGIOGRAPHY (N/A) as a surgical intervention.  The patient's history has been reviewed, patient examined, no change in status, stable for surgery.  I have reviewed the patient's chart and labs.  Questions were answered to the patient's satisfaction.    2016/2017 Appropriate Use Criteria for Coronary Revascularization Symptom Status: Ischemic Symptoms  Non-invasive Testing: Intermediate Risk  If no or indeterminate stress test, FFR/iFR results in all diseased vessels: N/A  Diabetes Mellitus: No  S/P CABG: No  Antianginal therapy (number of long-acting drugs): >=2  Patient undergoing renal transplant: No  Patient undergoing percutaneous valve procedure: No  1 Vessel Disease PCI CABG  No proximal LAD involvement, No proximal left dominant LCX involvement A (8); Indication 2 M (6); Indication 2  Proximal left dominant LCX involvement A (8); Indication 5 A (8); Indication 5  Proximal LAD involvement A (8); Indication 5 A (8); Indication 5  2 Vessel Disease  No proximal LAD involvement A (8); Indication 8 A (7); Indication 8  Proximal LAD involvement A (8); Indication 11 A (8); Indication 11  3 Vessel Disease  Low disease complexity (e.g., focal stenoses, SYNTAX <=22) A (8); Indication 17 A (8); Indication 17  Intermediate or high disease complexity (e.g., SYNTAX >=23) M (6); Indication 21 A (9); Indication 21  Left Main Disease  Isolated LMCA disease: ostial or midshaft A (7); Indication 24 A (9); Indication 24  Isolated LMCA disease: bifurcation  involvement M (6); Indication 25 A (9); Indication 25  LMCA ostial or midshaft, concurrent low disease burden multivessel disease (e.g., 1-2 additional focal stenoses, SYNTAX <=22) A (7); Indication 26 A (9); Indication 26  LMCA ostial or midshaft, concurrent intermediate or high disease burden multivessel disease (e.g., 1-2 additional bifurcation stenoses, long stenoses, SYNTAX >=23) M (4); Indication 27 A (9); Indication 27  LMCA bifurcation involvement, concurrent low disease burden multivessel disease (e.g., 1-2 additional focal stenoses, SYNTAX <=22) M (6); Indication 28 A (9); Indication 28  LMCA bifurcation involvement, concurrent intermediate or high disease burden multivessel disease (e.g., 1-2 additional bifurcation stenoses, long stenoses, SYNTAX >=23) R (3); Indication 29 A (9); Indication [place]

## 2022-07-08 ENCOUNTER — Ambulatory Visit (HOSPITAL_COMMUNITY): Payer: Medicare Other

## 2022-07-08 ENCOUNTER — Encounter (HOSPITAL_COMMUNITY): Payer: Self-pay | Admitting: Cardiology

## 2022-07-08 DIAGNOSIS — G319 Degenerative disease of nervous system, unspecified: Secondary | ICD-10-CM | POA: Diagnosis not present

## 2022-07-08 DIAGNOSIS — I350 Nonrheumatic aortic (valve) stenosis: Secondary | ICD-10-CM | POA: Diagnosis not present

## 2022-07-08 DIAGNOSIS — F32A Depression, unspecified: Secondary | ICD-10-CM | POA: Diagnosis not present

## 2022-07-08 DIAGNOSIS — I639 Cerebral infarction, unspecified: Secondary | ICD-10-CM | POA: Diagnosis not present

## 2022-07-08 DIAGNOSIS — I97820 Postprocedural cerebrovascular infarction during cardiac surgery: Secondary | ICD-10-CM | POA: Diagnosis not present

## 2022-07-08 DIAGNOSIS — I10 Essential (primary) hypertension: Secondary | ICD-10-CM | POA: Diagnosis not present

## 2022-07-08 DIAGNOSIS — I2510 Atherosclerotic heart disease of native coronary artery without angina pectoris: Secondary | ICD-10-CM | POA: Diagnosis not present

## 2022-07-08 DIAGNOSIS — I6310 Cerebral infarction due to embolism of unspecified precerebral artery: Secondary | ICD-10-CM | POA: Diagnosis not present

## 2022-07-08 DIAGNOSIS — E1142 Type 2 diabetes mellitus with diabetic polyneuropathy: Secondary | ICD-10-CM | POA: Diagnosis not present

## 2022-07-08 DIAGNOSIS — G8194 Hemiplegia, unspecified affecting left nondominant side: Secondary | ICD-10-CM | POA: Diagnosis not present

## 2022-07-08 DIAGNOSIS — I110 Hypertensive heart disease with heart failure: Secondary | ICD-10-CM | POA: Diagnosis not present

## 2022-07-08 DIAGNOSIS — I2723 Pulmonary hypertension due to lung diseases and hypoxia: Secondary | ICD-10-CM | POA: Diagnosis not present

## 2022-07-08 DIAGNOSIS — E785 Hyperlipidemia, unspecified: Secondary | ICD-10-CM | POA: Diagnosis not present

## 2022-07-08 DIAGNOSIS — R202 Paresthesia of skin: Secondary | ICD-10-CM | POA: Diagnosis not present

## 2022-07-08 DIAGNOSIS — I429 Cardiomyopathy, unspecified: Secondary | ICD-10-CM | POA: Diagnosis not present

## 2022-07-08 DIAGNOSIS — I25110 Atherosclerotic heart disease of native coronary artery with unstable angina pectoris: Secondary | ICD-10-CM | POA: Diagnosis not present

## 2022-07-08 DIAGNOSIS — E669 Obesity, unspecified: Secondary | ICD-10-CM | POA: Diagnosis not present

## 2022-07-08 DIAGNOSIS — I5022 Chronic systolic (congestive) heart failure: Secondary | ICD-10-CM | POA: Diagnosis not present

## 2022-07-08 DIAGNOSIS — E1151 Type 2 diabetes mellitus with diabetic peripheral angiopathy without gangrene: Secondary | ICD-10-CM | POA: Diagnosis not present

## 2022-07-08 DIAGNOSIS — I739 Peripheral vascular disease, unspecified: Secondary | ICD-10-CM | POA: Diagnosis not present

## 2022-07-08 DIAGNOSIS — I70219 Atherosclerosis of native arteries of extremities with intermittent claudication, unspecified extremity: Secondary | ICD-10-CM | POA: Diagnosis not present

## 2022-07-08 DIAGNOSIS — R29818 Other symptoms and signs involving the nervous system: Secondary | ICD-10-CM | POA: Diagnosis not present

## 2022-07-08 LAB — GLUCOSE, CAPILLARY
Glucose-Capillary: 159 mg/dL — ABNORMAL HIGH (ref 70–99)
Glucose-Capillary: 184 mg/dL — ABNORMAL HIGH (ref 70–99)
Glucose-Capillary: 145 mg/dL — ABNORMAL HIGH (ref 70–99)
Glucose-Capillary: 153 mg/dL — ABNORMAL HIGH (ref 70–99)

## 2022-07-08 LAB — CBC
HCT: 37.3 % — ABNORMAL LOW (ref 39.0–52.0)
Hemoglobin: 13.1 g/dL (ref 13.0–17.0)
MCH: 32.5 pg (ref 26.0–34.0)
MCHC: 35.1 g/dL (ref 30.0–36.0)
MCV: 92.6 fL (ref 80.0–100.0)
Platelets: 219 10*3/uL (ref 150–400)
RBC: 4.03 MIL/uL — ABNORMAL LOW (ref 4.22–5.81)
RDW: 12.5 % (ref 11.5–15.5)
WBC: 9.3 10*3/uL (ref 4.0–10.5)
nRBC: 0 % (ref 0.0–0.2)

## 2022-07-08 LAB — BASIC METABOLIC PANEL
Anion gap: 6 (ref 5–15)
BUN: 23 mg/dL (ref 8–23)
CO2: 21 mmol/L — ABNORMAL LOW (ref 22–32)
Calcium: 8.7 mg/dL — ABNORMAL LOW (ref 8.9–10.3)
Chloride: 112 mmol/L — ABNORMAL HIGH (ref 98–111)
Creatinine, Ser: 1.37 mg/dL — ABNORMAL HIGH (ref 0.61–1.24)
GFR, Estimated: 53 mL/min — ABNORMAL LOW (ref >60)
Glucose, Bld: 166 mg/dL — ABNORMAL HIGH (ref 70–99)
Potassium: 3.6 mmol/L (ref 3.5–5.1)
Sodium: 139 mmol/L (ref 135–145)

## 2022-07-08 MED ORDER — STROKE: EARLY STAGES OF RECOVERY BOOK
Freq: Once | Status: AC
  Filled 2022-07-08: qty 1

## 2022-07-08 NOTE — Progress Notes (Signed)
Mobility Specialist - Progress Note   07/08/22 1143  Mobility  Activity Ambulated with assistance in room  Level of Assistance Minimal assist, patient does 75% or more  Assistive Device Front wheel walker  Distance Ambulated (ft) 20 ft  Activity Response Tolerated poorly  Mobility Referral Yes  $Mobility charge 1 Mobility   Pt received in bed and agreeable. Pt is MinA to situp on EOB and has poor center balance and is constantly falling back. Upon sitting up on EOB pt c/o not feeling good but reported no dizziness or lightheadedness. Pt had x3 knee buckled throughout ambulation. Pt was returned EOB with all needs met and RN notified.   Larey Seat

## 2022-07-08 NOTE — Progress Notes (Signed)
Subjective:  Generalized weakness earlier in the day Complained of left hand numbness this afternoon On further questioning, reports that he may have had it for three days  Objective:  Vital Signs in the last 24 hours: Temp:  [97.9 F (36.6 C)-98.6 F (37 C)] 98.6 F (37 C) (11/01 1341) Pulse Rate:  [62-77] 62 (11/01 1341) Resp:  [18-20] 18 (11/01 0412) BP: (126-159)/(59-82) 151/71 (11/01 0856) SpO2:  [95 %-100 %] 100 % (11/01 1341) Weight:  [96.7 kg] 96.7 kg (11/01 0412)  Intake/Output from previous day: 10/31 0701 - 11/01 0700 In: 240 [P.O.:240] Out: 200 [Urine:200]  Physical Exam Vitals and nursing note reviewed.  Constitutional:      General: He is not in acute distress. Neck:     Vascular: No JVD.  Cardiovascular:     Rate and Rhythm: Normal rate and regular rhythm.     Heart sounds: Normal heart sounds. No murmur heard. Pulmonary:     Effort: Pulmonary effort is normal.     Breath sounds: Normal breath sounds. No wheezing or rales.  Musculoskeletal:     Right lower leg: No edema.     Left lower leg: No edema.  Neurological:     Comments: LUE, LLE 4/5 strength and sensory deficit       Imaging/tests reviewed and independently interpreted: CT head ordered   CT cerebral perfusion 12/11/2019: 1. No large vessel occlusion. 2. 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 3. Cervical carotid and vertebral artery atherosclerosis without stenosis. 4. Negative CTP. 5. Aortic Atherosclerosis (ICD10-I70.0).   Cardiac Studies:  Telemetry 07/08/2022: No significant arrhythmia  EKG 07/08/2022: Sinus rhythm LVH Nonspecific ST-T abnormality  Coronary intervention 07/07/2022: LM: Normal LAD: Prox 40%, followed by prox-mid 80% calcific stenosis         Successful percutaneous coronary intervention prox-mid LAD         Orbital atherectomy, PTCA and overlapping stents placement mid to prox LAD         3.5 X 24 mm and 3.5 X 12 mm  Synergy drug-eluting stents         Post dilatation with 3.5X18 mm Jennings balloon up to 22 atm Lcx: OM with moderate disease RCA: Prox 50% Mid calcific 75% stenosis. RFR 0.96   No significant disease in abdominal aorta, renal arteries, bilateral ileofemoral arteries. Severe tortuosity noted.   Will stage LE runoff and itnervention  Echocardiogram 02/26/2022:  1. Compared to 02/20/22, pericardial effusion appears to be similar in  size; no tamponade physiology.   2. Left ventricular ejection fraction, by estimation, is 45 to 50%. The  left ventricle has mildly decreased function. The left ventricle  demonstrates global hypokinesis. There is severe concentric left  ventricular hypertrophy. Left ventricular diastolic   parameters are consistent with Grade I diastolic dysfunction (impaired  relaxation). Elevated left atrial pressure.   3. Right ventricular systolic function is normal. The right ventricular  size is normal.   4. Left atrial size was mildly dilated.   5. Moderate pericardial effusion.   6. The mitral valve is normal in structure. No evidence of mitral valve  regurgitation. No evidence of mitral stenosis.   7. The aortic valve is tricuspid. Aortic valve regurgitation is not  visualized. Mild to moderate aortic valve stenosis.   8. The inferior vena cava is normal in size with greater than 50%  respiratory variability, suggesting right atrial pressure of 3 mmHg.    Assessment & Recommendations:  76 y/o Caucasian  male with HF with mildly reduced EF, CAD with angina in medical therapy, PAD with severe claudication.   Left sided weakness, numbness: 4/5 weakness and sensory deficit. Onset of symptoms unclear. Stat CT head ordered. Neurology consulted.  CAD: S/p prox-mid LAD PCI 07/07/2022 Recommend Aspirin and plavix for at least 6 months Continue baseline anti anginal therapy, high intensity statin  PAD: Severe claudication. Continue medical management. Will consider  outpatient revascularization.     Nigel Mormon, MD Pager: [phone] Office: [phone]

## 2022-07-08 NOTE — Plan of Care (Signed)
Problem: Education: Goal: Understanding of CV disease, CV risk reduction, and recovery process will improve Outcome: Progressing Goal: Individualized Educational Video(s) Outcome: Progressing   Problem: Activity: Goal: Ability to return to baseline activity level will improve Outcome: Progressing   Problem: Cardiovascular: Goal: Ability to achieve and maintain adequate cardiovascular perfusion will improve Outcome: Progressing Goal: Vascular access site(s) Level 0-1 will be maintained Outcome: Progressing   Problem: Health Behavior/Discharge Planning: Goal: Ability to safely manage health-related needs after discharge will improve Outcome: Progressing   Problem: Education: Goal: Knowledge of cardiac device and self-care will improve Outcome: Progressing Goal: Ability to safely manage health related needs after discharge will improve Outcome: Progressing Goal: Individualized Educational Video(s) Outcome: Progressing   Problem: Cardiac: Goal: Ability to achieve and maintain adequate cardiopulmonary perfusion will improve Outcome: Progressing   Problem: Education: Goal: Knowledge of General Education information will improve Description: Including pain rating scale, medication(s)/side effects and non-pharmacologic comfort measures Outcome: Progressing   Problem: Health Behavior/Discharge Planning: Goal: Ability to manage health-related needs will improve Outcome: Progressing   Problem: Clinical Measurements: Goal: Ability to maintain clinical measurements within normal limits will improve Outcome: Progressing Goal: Will remain free from infection Outcome: Progressing Goal: Diagnostic test results will improve Outcome: Progressing Goal: Respiratory complications will improve Outcome: Progressing Goal: Cardiovascular complication will be avoided Outcome: Progressing   Problem: Activity: Goal: Risk for activity intolerance will decrease Outcome: Progressing    Problem: Nutrition: Goal: Adequate nutrition will be maintained Outcome: Progressing   Problem: Coping: Goal: Level of anxiety will decrease Outcome: Progressing   Problem: Elimination: Goal: Will not experience complications related to bowel motility Outcome: Progressing Goal: Will not experience complications related to urinary retention Outcome: Progressing   Problem: Pain Managment: Goal: General experience of comfort will improve Outcome: Progressing   Problem: Safety: Goal: Ability to remain free from injury will improve Outcome: Progressing   Problem: Skin Integrity: Goal: Risk for impaired skin integrity will decrease Outcome: Progressing   Problem: Education: Goal: Knowledge of General Education information will improve Description: Including pain rating scale, medication(s)/side effects and non-pharmacologic comfort measures Outcome: Progressing   Problem: Health Behavior/Discharge Planning: Goal: Ability to manage health-related needs will improve Outcome: Progressing   Problem: Clinical Measurements: Goal: Ability to maintain clinical measurements within normal limits will improve Outcome: Progressing Goal: Will remain free from infection Outcome: Progressing Goal: Diagnostic test results will improve Outcome: Progressing Goal: Respiratory complications will improve Outcome: Progressing Goal: Cardiovascular complication will be avoided Outcome: Progressing   Problem: Activity: Goal: Risk for activity intolerance will decrease Outcome: Progressing   Problem: Nutrition: Goal: Adequate nutrition will be maintained Outcome: Progressing   Problem: Coping: Goal: Level of anxiety will decrease Outcome: Progressing   Problem: Elimination: Goal: Will not experience complications related to bowel motility Outcome: Progressing Goal: Will not experience complications related to urinary retention Outcome: Progressing   Problem: Pain Managment: Goal:  General experience of comfort will improve Outcome: Progressing   Problem: Safety: Goal: Ability to remain free from injury will improve Outcome: Progressing   Problem: Skin Integrity: Goal: Risk for impaired skin integrity will decrease Outcome: Progressing   Problem: Education: Goal: Ability to describe self-care measures that may prevent or decrease complications (Diabetes Survival Skills Education) will improve Outcome: Progressing Goal: Individualized Educational Video(s) Outcome: Progressing   Problem: Coping: Goal: Ability to adjust to condition or change in health will improve Outcome: Progressing   Problem: Fluid Volume: Goal: Ability to maintain a balanced intake and output will improve Outcome: Progressing     Problem: Health Behavior/Discharge Planning: Goal: Ability to identify and utilize available resources and services will improve Outcome: Progressing Goal: Ability to manage health-related needs will improve Outcome: Progressing   

## 2022-07-08 NOTE — Progress Notes (Signed)
Pt to MRI via bed.

## 2022-07-08 NOTE — Significant Event (Addendum)
Rapid Response Event Note   Reason for Call :  Left sided weakness, left sided sensory changes  Initial Focused Assessment:  Pt lying in bed, alert. Assessment notable for disorientation to month, left arm and left leg drift, and left sided sensory deficits.   After further discussion, pt states that his hand numbness started about three days ago. His wife noted he was confused and he complained of left hand numbness this morning when she arrived at 1030.   VS: T 97.84F, BP 161/84, HR 60, RR 18, SpO2 97% on room air CBG: 146  Interventions:  -With LKW unclear, acute stroke work-up was not initiated.  Plan of Care:  -STAT head CT  -Neurology consult placed by Dr. Virgina Jock -q2h NIHSS and VS x 12 hours, then q4h NIHSS and VS -bedside swallow screen -Establish IV access  Call rapid response for additional needs  Event Summary:  MD Notified: Dr. Virgina Jock- MD at bedside upon my arrival Call Time: [place] Time: 1614 End Time: [place], RN

## 2022-07-08 NOTE — Evaluation (Signed)
Physical Therapy Evaluation Patient Details Name: Thomas [name] MRN: [id number] DOB: Dec 04, 1945 Today's Date: 07/08/2022  History of Present Illness  Thomas [name]  is a 76 y.o. male who presented for ABDOMINAL AORTOGRAM W/LOWER EXTREMITY, and LEFT HEART CATH AND CORONARY ANGIOGRAPHY on 10/31. Pt with past medical history significant for hypertension, newly reduced ejection fraction, unstable angina, diabetes, OA of knee, HLD  Clinical Impression   Pt admitted with above diagnosis. At baseline, pt is independent and ambulatory with cane.  Today, pt presents with L UE numbness, L UE weakness and drift, mild L facial droop, questionable L LE decreased sensation, L LE weakness with buckling (some buckling at baseline), poor balance with posterior and L lateral lean, and requiring min-mod A of 2 for transfers.  Wife reports significantly different from baseline.  PT and OT notified RN and charge nurse of concern of stroke symptoms - RN and charge RN in room calling code stroke. Pt currently with functional limitations due to the deficits listed below (see PT Problem List). Pt will benefit from skilled PT to increase their independence and safety with mobility to allow discharge to the venue listed below.          Recommendations for follow up therapy are one component of a multi-disciplinary discharge planning process, led by the attending physician.  Recommendations may be updated based on patient status, additional functional criteria and insurance authorization.  Follow Up Recommendations Acute inpatient rehab (3hours/day)      Assistance Recommended at Discharge Frequent or constant Supervision/Assistance  Patient can return home with the following  A lot of help with walking and/or transfers;A lot of help with bathing/dressing/bathroom;Assistance with cooking/housework;Help with stairs or ramp for entrance    Equipment Recommendations Other (comment) (needs further assessment)   Recommendations for Other Services  Rehab consult    Functional Status Assessment Patient has had a recent decline in their functional status and demonstrates the ability to make significant improvements in function in a reasonable and predictable amount of time.     Precautions / Restrictions Precautions Precautions: Fall Restrictions Weight Bearing Restrictions: No      Mobility  Bed Mobility Overal bed mobility: Needs Assistance Bed Mobility: Supine to Sit, Sit to Supine     Supine to sit: Min assist Sit to supine: Min assist   General bed mobility comments: pt agitated with therapist assist for bed mobility, benefits from cues.  With transfer back to bed L UE falling off edge of bed when pt shifting    Transfers Overall transfer level: Needs assistance Equipment used: Rolling walker (2 wheels) Transfers: Sit to/from Stand Sit to Stand: +2 physical assistance, +2 safety/equipment, Min assist           General transfer comment: Difficulty getting L hand onto handle; L knee buckle immediately upon standing with posterior LOB. Terminated mobility for safety.    Ambulation/Gait                  Stairs            Wheelchair Mobility    Modified Rankin (Stroke Patients Only)       Balance Overall balance assessment: Needs assistance Sitting-balance support: Feet supported Sitting balance-Leahy Scale: Poor Sitting balance - Comments: Pt with posterior and L lateral LOBs, min G-mod A for upright balance. Required cues for awareness of midline   Standing balance support: Bilateral upper extremity supported Standing balance-Leahy Scale: Poor Standing balance comment: Requiring min-mod A of 2 for  safety with RW                             Pertinent Vitals/Pain Pain Assessment Pain Assessment: No/denies pain    Home Living Family/patient expects to be discharged to:: Private residence Living Arrangements: Spouse/significant  other Available Help at Discharge: Available 24 hours/day;Family Type of Home: House Home Access: Level entry;Elevator       Home Layout: One level Home Equipment: Conservation officer, nature (2 wheels);Cane - single point;BSC/3in1;Tub bench;Grab bars - toilet;Grab bars - tub/shower;Wheelchair - Press photographer      Prior Function Prior Level of Function : Independent/Modified Independent             Mobility Comments: SPC for mobility; able to ambulate in community ADLs Comments: mod I     Hand Dominance   Dominant Hand: Right    Extremity/Trunk Assessment   Upper Extremity Assessment Upper Extremity Assessment: LUE deficits/detail RUE Deficits / Details: overall WFL LUE Deficits / Details: unable to hold shoulder flexion against gravity, poor FM coordination, paraesthesia noted throughout arm and no respose to noxious stim on digits wtih eyes closed. significantly impaired sensory motor LUE Sensation: decreased light touch;decreased proprioception LUE Coordination: decreased fine motor;decreased gross motor    Lower Extremity Assessment Lower Extremity Assessment: LLE deficits/detail;RLE deficits/detail RLE Deficits / Details: ROM WFL; MMT 5/5 throughout LLE Deficits / Details: ROM WFL; MMT : ankle DF 4/5, knee ext 4/5, hip flexion 3/5    Cervical / Trunk Assessment Cervical / Trunk Assessment: Kyphotic  Communication   Communication: HOH  Cognition Arousal/Alertness: Awake/alert Behavior During Therapy: Agitated Overall Cognitive Status: Impaired/Different from baseline Area of Impairment: Safety/judgement, Awareness, Problem solving                         Safety/Judgement: Decreased awareness of safety, Decreased awareness of deficits Awareness: Emergent Problem Solving: Slow processing, Decreased initiation General Comments: Pt with limited insight to deficits, attributing numbness in LUE to lack of sleep. He benefitted from simple cues and increased  time for processing. Pt became mildly agitated with therapist for assisting with mobility, easily re-directed        General Comments General comments (skin integrity, edema, etc.): BP elevated throughout session with MAP of 103.  Wife present.  Neuro exam initiated with concern for CVA symptoms based on presentation.  Notified RN and charge RN who were calling code stroke.    Exercises     Assessment/Plan    PT Assessment Patient needs continued PT services  PT Problem List Decreased strength;Decreased mobility;Decreased safety awareness;Decreased range of motion;Decreased coordination;Decreased knowledge of precautions;Decreased activity tolerance;Decreased cognition;Decreased balance;Cardiopulmonary status limiting activity;Decreased knowledge of use of DME       PT Treatment Interventions DME instruction;Therapeutic activities;Gait training;Therapeutic exercise;Patient/family education;Cognitive remediation;Stair training;Balance training;Functional mobility training;Neuromuscular re-education    PT Goals (Current goals can be found in the Care Plan section)  Acute Rehab PT Goals Patient Stated Goal: return home PT Goal Formulation: With patient/family Time For Goal Achievement: 07/22/22 Potential to Achieve Goals: Good    Frequency Min 4X/week     Co-evaluation PT/OT/SLP Co-Evaluation/Treatment: Yes Reason for Co-Treatment: Complexity of the patient's impairments (multi-system involvement);For patient/therapist safety PT goals addressed during session: Mobility/safety with mobility;Balance OT goals addressed during session: ADL's and self-care;Strengthening/ROM       AM-PAC PT "6 Clicks" Mobility  Outcome Measure Help needed turning from your back to your side while in  a flat bed without using bedrails?: A Little Help needed moving from lying on your back to sitting on the side of a flat bed without using bedrails?: A Little Help needed moving to and from a bed to a  chair (including a wheelchair)?: A Lot Help needed standing up from a chair using your arms (e.g., wheelchair or bedside chair)?: A Lot Help needed to walk in hospital room?: Total Help needed climbing 3-5 steps with a railing? : Total 6 Click Score: 12    End of Session Equipment Utilized During Treatment: Gait belt Activity Tolerance: Treatment limited secondary to medical complications (Comment) Patient left: in bed;with call bell/phone within reach;with bed alarm set Nurse Communication: Mobility status;Other (comment) (concern for stroke symptoms; RN and charge RN alerted and calling code stroke) PT Visit Diagnosis: Other abnormalities of gait and mobility (R26.89);Muscle weakness (generalized) (M62.81);Hemiplegia and hemiparesis Hemiplegia - Right/Left: Left Hemiplegia - dominant/non-dominant: Non-dominant Hemiplegia - caused by: Unspecified    Time: 2103-1281 PT Time Calculation (min) (ACUTE ONLY): 22 min   Charges:   PT Evaluation $PT Eval Moderate Complexity: 1 Mod          Reyann Troop, PT Acute Rehab [facility] [phone]   Karlton Lemon 07/08/2022, 5:43 PM

## 2022-07-08 NOTE — Progress Notes (Signed)
Inpatient Rehab Admissions Coordinator :  Per therapy recommendations, patient was screened for CIR candidacy by Danne Baxter RN MSN.  At this time patient appears to be a potential candidate for CIR. I will place a rehab consult per protocol for full assessment. Please call me with any questions.  Danne Baxter RN MSN Admissions Coordinator [phone]

## 2022-07-08 NOTE — Progress Notes (Signed)
CARDIAC REHAB PHASE I     Pt resting in bed feeling well this morning. Pt up out of bed for walk took few steps and left knee was buckling out from under him. Returned to bed with call bell and bedside table in reach. Pt states his left knee gives him trouble sometimes. Denied cp or sob with activity. Encouraged pt to exercise left leg in bed and attempt ambulation after breakfast. Post stent education including site care, restrictions, risk factors, smoking cessation, heart healthy diet, antiplatelet therapy importance, exercise guidelines and CRP2 reviewed. All questions and concerns addressed. Will refer to [facility] for CRP2. Plan for home today.   [id number]  Vanessa Barbara, RN BSN 07/08/2022 9:06 AM

## 2022-07-08 NOTE — Progress Notes (Signed)
Per patient and his wife they don't feel like he is ready to go home d/t weakness and his left knee buckling upon ambulation. Md Patwardhan aware, will continue to monitor.

## 2022-07-08 NOTE — Consult Note (Signed)
Neurology Consultation  Reason for Consult: Left-sided weakness, confusion Referring Physician: Dr. Virgina Jock  CC: Left-sided weakness, confusion  History is obtained from: Chart, patient  HPI: Thomas [name] is a 76 y.o. 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 Patient reported difficulty using his left hand now ongoing for 3 days.  Stroke response nurses evaluated the patient earlier in the evening and got the same history of last known well about 3 days ago since which his left hand and left leg have been weaker.  He has been having trouble walking with weightbearing on that leg. Family also noted some confusion.  He is not very sure about the confusion.  Neurology consult was obtained due to the focal neurological deficits  Code stroke was not activated due to the last known well being at least 3 days ago versus unclear.    LKW: 3 days ago versus unclear IV thrombolysis given?: no, outside the window Premorbid modified Rankin scale (mRS): 2  ROS: Full ROS was performed and is negative except as noted in the HPI.   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 He has a 27.50 pack-year smoking history. He has been exposed to tobacco smoke. He has never used smokeless tobacco. He reports that he does not drink alcohol and does not use drugs.  Medications  Current Facility-Administered Medications:    0.9 %  sodium chloride infusion, 250 mL, Intravenous, PRN, Patwardhan, Manish J, MD   acetaminophen (TYLENOL) tablet 650 mg, 650 mg, Oral, Q4H PRN, Patwardhan, Manish J, MD   amLODipine (NORVASC) tablet 10 mg, 10 mg, Oral, q AM, Patwardhan, Manish J, MD, 10 mg at 07/08/22 0604   aspirin chewable tablet 81 mg, 81 mg, Oral, Q1200, Patwardhan, Manish J, MD, 81 mg at 07/08/22 1213   atorvastatin (LIPITOR) tablet 40 mg, 40 mg, Oral, QHS, Patwardhan, Manish J, MD, 40 mg at 07/07/22 2111   clopidogrel (PLAVIX) tablet 75 mg, 75 mg, Oral, Q breakfast, Patwardhan, Manish J, MD, 75 mg at 07/08/22 0856   colchicine tablet 0.6 mg, 0.6 mg, Oral, BID, Patwardhan, Manish J, MD, 0.6 mg at 07/08/22 0856   DULoxetine (CYMBALTA) DR capsule 60 mg, 60 mg, Oral, QPM, Patwardhan, Manish J, MD, 60 mg at 07/08/22 1710   empagliflozin (JARDIANCE) tablet 10 mg,  10 mg, Oral, QAC breakfast, Patwardhan, Manish J, MD, 10 mg at 07/08/22 0856   insulin aspart (novoLOG) injection 0-15 Units, 0-15 Units, Subcutaneous, TID WC, Patwardhan, Manish J, MD, 2 Units at 07/08/22 1710   isosorbide mononitrate (IMDUR) 24 hr tablet 30 mg, 30 mg, Oral, Daily, Patwardhan, Manish J, MD, 30 mg at 07/08/22 0857   methadone (DOLOPHINE) tablet 5 mg, 5 mg, Oral, QODAY, Patwardhan, Manish J, MD, 5 mg at 07/08/22 1119   metoprolol succinate (TOPROL-XL) 24 hr tablet 25 mg, 25 mg, Oral, Daily, Patwardhan, Manish  J, MD, 25 mg at 07/08/22 0856   mirabegron ER (MYRBETRIQ) tablet 25 mg, 25 mg, Oral, Daily, Patwardhan, Manish J, MD, 25 mg at 07/08/22 0856   nitroGLYCERIN 50 mg in dextrose 5 % 250 mL (0.2 mg/mL) infusion, 2-200 mcg/min, Intravenous, Titrated, Patwardhan, Manish J, MD   ondansetron (ZOFRAN) injection 4 mg, 4 mg, Intravenous, Q6H PRN, Patwardhan, Manish J, MD   oxyCODONE (Oxy IR/ROXICODONE) immediate release tablet 5 mg, 5 mg, Oral, QID PRN, Patwardhan, Manish J, MD   sodium chloride flush (NS) 0.9 % injection 3 mL, 3 mL, Intravenous, Q12H, Patwardhan, Manish J, MD   sodium chloride flush (NS) 0.9 % injection 3 mL, 3 mL, Intravenous, PRN, Patwardhan, Manish J, MD   tamsulosin (FLOMAX) capsule 0.4 mg, 0.4 mg, Oral, QHS, Patwardhan, Manish J, MD, 0.4 mg at 07/07/22 2111   tiZANidine (ZANAFLEX) tablet 4 mg, 4 mg, Oral, QHS, Patwardhan, Manish J, MD, 4 mg at 07/07/22 2111   Exam: Current vital signs: BP (!) 161/84   Pulse 60   Temp 97.9 F (36.6 C) (Oral)   Resp 19   Ht _0  (1.803 m)   Wt 96.7 kg   SpO2 94%   BMI 29.73 kg/m  Vital signs in last 24 hours: Temp:  [97.9 F (36.6 C)-98.6 F (37 C)] 97.9 F (36.6 C) (11/01 1700) Pulse Rate:  [60-68] 60 (11/01 1615) Resp:  [18-20] 19 (11/01 1700) BP: (126-161)/(59-84) 161/84 (11/01 1700) SpO2:  [94 %-100 %] 94 % (11/01 1700) Weight:  [96.7 kg] 96.7 kg (11/01 0412) GEN: Awake alert HEENT: Normocephalic atraumatic Lungs: Clear Cardiovascular: Regular rate rhythm Abdomen nondistended nontender Neurological exam Awake alert oriented x3 No dysarthria No evidence of aphasia Cranial nerves II to XII intact Motor examination with left upper and lower extremity drift, full strength in the right upper and lower extremity. Sensation intact without extinction bilaterally Coordination with dysmetria proportionate to the weakness on the left.  No dysmetria on the right. NIH stroke scale 1a Level of Conscious.: 0 1b LOC Questions: 0 1c  LOC Commands: 0 2 Best Gaze: 0 3 Visual: 0 4 Facial Palsy: 0 5a Motor Arm - left: 2 5b Motor Arm - Right: 0 6a Motor Leg - Left: 2 6b Motor Leg - Right: 0 7 Limb Ataxia: 0 8 Sensory: 0 9 Best Language: 0 10 Dysarthria: 0 11 Extinct. and Inatten.: 0 TOTAL: 4   Labs I have reviewed labs in epic and the results pertinent to this consultation are:  CBC    Component Value Date/Time   WBC 9.3 07/08/2022 0316   RBC 4.03 (L) 07/08/2022 0316   HGB 13.1 07/08/2022 0316   HCT 37.3 (L) 07/08/2022 0316   PLT 219 07/08/2022 0316   MCV 92.6 07/08/2022 0316   MCH 32.5 07/08/2022 0316   MCHC 35.1 07/08/2022 0316   RDW 12.5 07/08/2022 0316   LYMPHSABS 1,275 04/16/2021 1334   MONOABS 0.6 12/11/2019 1245  EOSABS 306 04/16/2021 1334   BASOSABS 68 04/16/2021 1334    CMP     Component Value Date/Time   NA 139 07/08/2022 0316   NA 138 03/09/2022 1103   K 3.6 07/08/2022 0316   CL 112 (H) 07/08/2022 0316   CO2 21 (L) 07/08/2022 0316   GLUCOSE 166 (H) 07/08/2022 0316   BUN 23 07/08/2022 0316   BUN 25 03/09/2022 1103   CREATININE 1.37 (H) 07/08/2022 0316   CALCIUM 8.7 (L) 07/08/2022 0316   PROT 7.6 04/28/2022 1041   ALBUMIN 4.4 04/28/2022 1041   AST 19 04/28/2022 1041   ALT 17 04/28/2022 1041   ALKPHOS 88 04/28/2022 1041   BILITOT 0.8 04/28/2022 1041   GFRNONAA 53 (L) 07/08/2022 0316   GFRAA >60 12/13/2019 0405    Lipid Panel     Component Value Date/Time   CHOL 234 (H) 04/28/2022 1041   TRIG 180.0 (H) 04/28/2022 1041   HDL 37.80 (L) 04/28/2022 1041   CHOLHDL 6 04/28/2022 1041   VLDL 36.0 04/28/2022 1041   LDLCALC 161 (H) 04/28/2022 1041   2D echocardiogram 02/26/2022 LVEF 45 to 50%, pericardial effusion similar in size to that from 02/20/2022.  Left ventricle demonstrates global hypokinesis with severe concentric LVH and grade 1 diastolic dysfunction along with left atrial pressures elevation. Right ventricular systolic function normal.  Left atrium mildly dilated.   Moderate pericardial effusion.  Mitral valve normal.  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  No atrial level shunt detected by color-flow Doppler.   Imaging I have reviewed the images obtained:  CT-head: Radiology report with no acute changes.  Question a subtle hypodensity in the right periventricular corona radiata.   Assessment: 76 year old with above past medical history with 3 or more days worth of left-sided arm and leg weakness concerning for acute ischemic stroke. Currently on dual antiplatelets. Most recent echocardiogram was in June-findings above Small vessel disease versus periprocedural stroke versus cardioembolic etiology remains in consideration  Recommendations: Continue DAPT Continue high intensity statin Check A1c Lipid panel was done in August-I will repeated. I would also repeat a 2D echo I will check MRI and MRA of the head without contrast and carotid Dopplers-avoiding CTA due to borderline renal function. No need for permissive hypertension Since he is at least 3 or more days out of his last known well, can get neurochecks every 4 hours. PT OT Speech therapy Bedside swallow screen-keep n.p.o. if fails that.  Stroke team to follow  Plan relayed via secure chat to Dr. Virgina Jock   -- Amie [place], MD Neurologist [facility] Pager: [phone]

## 2022-07-08 NOTE — Evaluation (Signed)
Occupational Therapy Evaluation Patient Details Name: Thomas [name] MRN: [id number] DOB: Aug 16, 1946 Today's Date: 07/08/2022   History of Present Illness Thomas [name]  is a 76 y.o. male who presented for ABDOMINAL AORTOGRAM W/LOWER EXTREMITY, and LEFT HEART CATH AND CORONARY ANGIOGRAPHY on 10/31. Pt with past medical history significant for hypertension, newly reduced ejection fraction, unstable angina, diabetes, OA of knee, HLD   Clinical Impression   [name] was evaluated s/p the above admission list, per pt he is mod I at baseline with use of SPC. Upon evaluation pt had functional deficits due to LUE weakness, sensory motor deficits, paraesthesias, impaired proprioception, poor sitting and standing balance, L knee buckling, impaired cognition and decreased activity tolerance. Pt's wife present and confirmed L hemibody deficits and posterior/L lateral balance losses are new. Overall he required min A for bed mobility and min A +2 to sit<>stand. Upon standing pt had immediate L knee buckle and posterior LOB. Due to deficits listed above and below, pt required up to mod A for UB ADLs and max A for LB ADLs. OT to continue to follow acutely. Recommend AIR at d/c for maximal functional progress towards mod I baseline.   RN notified of concern for neuro status change and associated symptoms.      Recommendations for follow up therapy are one component of a multi-disciplinary discharge planning process, led by the attending physician.  Recommendations may be updated based on patient status, additional functional criteria and insurance authorization.   Follow Up Recommendations  Acute inpatient rehab (3hours/day)       Patient can return home with the following A lot of help with walking and/or transfers;A lot of help with bathing/dressing/bathroom;Assistance with cooking/housework;Assist for transportation;Help with stairs or ramp for entrance    Functional Status Assessment  Patient has had a  recent decline in their functional status and demonstrates the ability to make significant improvements in function in a reasonable and predictable amount of time.  Equipment Recommendations  None recommended by OT (pt is well equipped)    Recommendations for Other Services Rehab consult     Precautions / Restrictions Precautions Precautions: Fall Restrictions Weight Bearing Restrictions: No      Mobility Bed Mobility Overal bed mobility: Needs Assistance Bed Mobility: Supine to Sit, Sit to Supine     Supine to sit: Min assist Sit to supine: Min assist   General bed mobility comments: pt agitated with therapist assist for bed mobility, benefits from cues    Transfers Overall transfer level: Needs assistance Equipment used: Rolling walker (2 wheels) Transfers: Sit to/from Stand Sit to Stand: +2 physical assistance, +2 safety/equipment, Min assist           General transfer comment: knee buckle immediately upon standing with posterior LOB. terminated mobility for safety.      Balance Overall balance assessment: Needs assistance Sitting-balance support: Feet supported Sitting balance-Leahy Scale: Poor Sitting balance - Comments: posterior and L lateral LOBs, min G-mod A for upright balance. cues for awareness of midline   Standing balance support: Bilateral upper extremity supported Standing balance-Leahy Scale: Poor                             ADL either performed or assessed with clinical judgement   ADL Overall ADL's : Needs assistance/impaired Eating/Feeding: Set up;Sitting   Grooming: Set up;Sitting;Minimal assistance Grooming Details (indicate cue type and reason): min A for sitting balance Upper Body Bathing: Minimal assistance;Sitting  Lower Body Bathing: Maximal assistance;Sit to/from stand   Upper Body Dressing : Minimal assistance;Sitting   Lower Body Dressing: Maximal assistance;Sit to/from stand   Toilet Transfer: Moderate  assistance;+2 for physical assistance;+2 for safety/equipment;Ambulation Toilet Transfer Details (indicate cue type and reason): L knee buckle Toileting- Clothing Manipulation and Hygiene: Maximal assistance;Sit to/from stand       Functional mobility during ADLs: Moderate assistance;+2 for physical assistance;+2 for safety/equipment General ADL Comments: limited by poor LUE sensory motor, L knee buckling, impaired balance with posterior and L lateral lean and decreased activity tolerance     Vision Baseline Vision/History: 0 No visual deficits Additional Comments: pt would benefit from further assessment, he denied overt/sudden vision changes            Pertinent Vitals/Pain Pain Assessment Pain Assessment: No/denies pain Pain Intervention(s): Monitored during session, Limited activity within patient's tolerance     Hand Dominance Right   Extremity/Trunk Assessment Upper Extremity Assessment Upper Extremity Assessment: LUE deficits/detail;RUE deficits/detail RUE Deficits / Details: overall WFL LUE Deficits / Details: unable to hold shoulder flexion against gravity, poor FM coordination, paraesthesia noted throughout arm and no respose to noxious stim on digits wtih eyes closed. significantly impaired sensory motor LUE Sensation: decreased light touch;decreased proprioception LUE Coordination: decreased fine motor;decreased gross motor   Lower Extremity Assessment Lower Extremity Assessment: Defer to PT evaluation   Cervical / Trunk Assessment Cervical / Trunk Assessment: Kyphotic (mild)   Communication Communication Communication: HOH   Cognition Arousal/Alertness: Awake/alert Behavior During Therapy: Agitated Overall Cognitive Status: Impaired/Different from baseline Area of Impairment: Safety/judgement, Awareness, Problem solving                         Safety/Judgement: Decreased awareness of safety, Decreased awareness of deficits Awareness:  Emergent Problem Solving: Slow processing, Decreased initiation General Comments: limited insight to deficits, attributing numbness in LUE to lack of sleep. benefitted from simple cues adn increased time for processing. Pt became mildly agitated with therapist for assisting with mobility, easily re-directed     General Comments  BP high with map of 103. Wife present. Neuro exam initiated with concern for CVA absed on presentation, RN notieifed.            Home Living Family/patient expects to be discharged to:: Private residence Living Arrangements: Spouse/significant other Available Help at Discharge: Available 24 hours/day;Family Type of Home: House Home Access: Level entry;Elevator     Home Layout: One level     Bathroom Shower/Tub: Teacher, early years/pre: Handicapped height     Home Equipment: Conservation officer, nature (2 wheels);Cane - single point;BSC/3in1;Tub bench;Grab bars - toilet;Grab bars - tub/shower;Wheelchair - Press photographer          Prior Functioning/Environment Prior Level of Function : Independent/Modified Independent             Mobility Comments: SPC fofr mobility ADLs Comments: mod I        OT Problem List: Decreased strength;Decreased range of motion;Decreased activity tolerance;Impaired balance (sitting and/or standing)      OT Treatment/Interventions: Self-care/ADL training;Therapeutic exercise;Neuromuscular education;DME and/or AE instruction;Therapeutic activities;Patient/family education;Balance training    OT Goals(Current goals can be found in the care plan section) Acute Rehab OT Goals Patient Stated Goal: to feel better OT Goal Formulation: With patient Time For Goal Achievement: 07/22/22 Potential to Achieve Goals: Good ADL Goals Pt Will Perform Grooming: with supervision;sitting Pt Will Perform Upper Body Dressing: with set-up;sitting Pt Will Perform Lower Body  Dressing: with min assist;sit to/from stand Pt Will  Transfer to Toilet: with min assist;ambulating Additional ADL Goal #1: Pt will demonstrate increased activity tolerance to complete at least 8 minutes of OOB functional activity without sitting rest break  OT Frequency: Min 2X/week    Co-evaluation PT/OT/SLP Co-Evaluation/Treatment: Yes Reason for Co-Treatment: Complexity of the patient's impairments (multi-system involvement);For patient/therapist safety;To address functional/ADL transfers   OT goals addressed during session: ADL's and self-care;Strengthening/ROM      AM-PAC OT "6 Clicks" Daily Activity     Outcome Measure Help from another person eating meals?: A Little Help from another person taking care of personal grooming?: A Little Help from another person toileting, which includes using toliet, bedpan, or urinal?: A Lot Help from another person bathing (including washing, rinsing, drying)?: A Lot Help from another person to put on and taking off regular upper body clothing?: A Little Help from another person to put on and taking off regular lower body clothing?: A Lot 6 Click Score: 15   End of Session Equipment Utilized During Treatment: Gait belt;Rolling walker (2 wheels) Nurse Communication: Mobility status  Activity Tolerance: Patient tolerated treatment well Patient left: in bed;with bed alarm set;with call bell/phone within reach;with family/visitor present  OT Visit Diagnosis: Unsteadiness on feet (R26.81);Other abnormalities of gait and mobility (R26.89);Muscle weakness (generalized) (M62.81);Hemiplegia and hemiparesis;History of falling (Z91.81) Hemiplegia - Right/Left: Left Hemiplegia - dominant/non-dominant: Non-Dominant Hemiplegia - caused by: Unspecified                Time: 3664-4034 OT Time Calculation (min): 17 min Charges:  OT General Charges $OT Visit: 1 Visit OT Evaluation $OT Eval Moderate Complexity: 1 Mod   Merrill Villarruel D Causey 07/08/2022, 5:23 PM

## 2022-07-09 ENCOUNTER — Ambulatory Visit (HOSPITAL_COMMUNITY): Payer: Medicare Other

## 2022-07-09 ENCOUNTER — Other Ambulatory Visit (HOSPITAL_COMMUNITY): Payer: Self-pay

## 2022-07-09 ENCOUNTER — Inpatient Hospital Stay (HOSPITAL_COMMUNITY): Payer: Medicare Other

## 2022-07-09 DIAGNOSIS — I69392 Facial weakness following cerebral infarction: Secondary | ICD-10-CM | POA: Diagnosis not present

## 2022-07-09 DIAGNOSIS — R202 Paresthesia of skin: Secondary | ICD-10-CM | POA: Diagnosis not present

## 2022-07-09 DIAGNOSIS — I6310 Cerebral infarction due to embolism of unspecified precerebral artery: Secondary | ICD-10-CM | POA: Diagnosis not present

## 2022-07-09 DIAGNOSIS — I25110 Atherosclerotic heart disease of native coronary artery with unstable angina pectoris: Secondary | ICD-10-CM | POA: Diagnosis present

## 2022-07-09 DIAGNOSIS — E785 Hyperlipidemia, unspecified: Secondary | ICD-10-CM | POA: Diagnosis present

## 2022-07-09 DIAGNOSIS — M5442 Lumbago with sciatica, left side: Secondary | ICD-10-CM | POA: Diagnosis not present

## 2022-07-09 DIAGNOSIS — G4700 Insomnia, unspecified: Secondary | ICD-10-CM | POA: Diagnosis not present

## 2022-07-09 DIAGNOSIS — I97820 Postprocedural cerebrovascular infarction during cardiac surgery: Secondary | ICD-10-CM | POA: Diagnosis not present

## 2022-07-09 DIAGNOSIS — F32A Depression, unspecified: Secondary | ICD-10-CM | POA: Diagnosis present

## 2022-07-09 DIAGNOSIS — I110 Hypertensive heart disease with heart failure: Secondary | ICD-10-CM | POA: Diagnosis present

## 2022-07-09 DIAGNOSIS — Z79899 Other long term (current) drug therapy: Secondary | ICD-10-CM | POA: Diagnosis not present

## 2022-07-09 DIAGNOSIS — I6340 Cerebral infarction due to embolism of unspecified cerebral artery: Secondary | ICD-10-CM | POA: Diagnosis not present

## 2022-07-09 DIAGNOSIS — G8194 Hemiplegia, unspecified affecting left nondominant side: Secondary | ICD-10-CM | POA: Diagnosis not present

## 2022-07-09 DIAGNOSIS — I69354 Hemiplegia and hemiparesis following cerebral infarction affecting left non-dominant side: Secondary | ICD-10-CM | POA: Diagnosis present

## 2022-07-09 DIAGNOSIS — Z955 Presence of coronary angioplasty implant and graft: Secondary | ICD-10-CM

## 2022-07-09 DIAGNOSIS — E1122 Type 2 diabetes mellitus with diabetic chronic kidney disease: Secondary | ICD-10-CM | POA: Diagnosis present

## 2022-07-09 DIAGNOSIS — Z6831 Body mass index (BMI) 31.0-31.9, adult: Secondary | ICD-10-CM | POA: Diagnosis not present

## 2022-07-09 DIAGNOSIS — F17210 Nicotine dependence, cigarettes, uncomplicated: Secondary | ICD-10-CM | POA: Diagnosis present

## 2022-07-09 DIAGNOSIS — Z7982 Long term (current) use of aspirin: Secondary | ICD-10-CM | POA: Diagnosis not present

## 2022-07-09 DIAGNOSIS — I639 Cerebral infarction, unspecified: Secondary | ICD-10-CM | POA: Diagnosis not present

## 2022-07-09 DIAGNOSIS — E669 Obesity, unspecified: Secondary | ICD-10-CM | POA: Diagnosis present

## 2022-07-09 DIAGNOSIS — I70219 Atherosclerosis of native arteries of extremities with intermittent claudication, unspecified extremity: Secondary | ICD-10-CM | POA: Diagnosis not present

## 2022-07-09 DIAGNOSIS — I10 Essential (primary) hypertension: Secondary | ICD-10-CM | POA: Diagnosis not present

## 2022-07-09 DIAGNOSIS — G319 Degenerative disease of nervous system, unspecified: Secondary | ICD-10-CM | POA: Diagnosis not present

## 2022-07-09 DIAGNOSIS — I130 Hypertensive heart and chronic kidney disease with heart failure and stage 1 through stage 4 chronic kidney disease, or unspecified chronic kidney disease: Secondary | ICD-10-CM | POA: Diagnosis present

## 2022-07-09 DIAGNOSIS — Z87892 Personal history of anaphylaxis: Secondary | ICD-10-CM | POA: Diagnosis not present

## 2022-07-09 DIAGNOSIS — R29810 Facial weakness: Secondary | ICD-10-CM | POA: Diagnosis not present

## 2022-07-09 DIAGNOSIS — I70213 Atherosclerosis of native arteries of extremities with intermittent claudication, bilateral legs: Secondary | ICD-10-CM | POA: Diagnosis present

## 2022-07-09 DIAGNOSIS — G608 Other hereditary and idiopathic neuropathies: Secondary | ICD-10-CM | POA: Diagnosis present

## 2022-07-09 DIAGNOSIS — I2510 Atherosclerotic heart disease of native coronary artery without angina pectoris: Secondary | ICD-10-CM | POA: Diagnosis present

## 2022-07-09 DIAGNOSIS — Z96651 Presence of right artificial knee joint: Secondary | ICD-10-CM | POA: Diagnosis present

## 2022-07-09 DIAGNOSIS — I2723 Pulmonary hypertension due to lung diseases and hypoxia: Secondary | ICD-10-CM | POA: Diagnosis present

## 2022-07-09 DIAGNOSIS — R29818 Other symptoms and signs involving the nervous system: Secondary | ICD-10-CM | POA: Diagnosis not present

## 2022-07-09 DIAGNOSIS — Z820 Family history of epilepsy and other diseases of the nervous system: Secondary | ICD-10-CM | POA: Diagnosis not present

## 2022-07-09 DIAGNOSIS — M109 Gout, unspecified: Secondary | ICD-10-CM | POA: Diagnosis present

## 2022-07-09 DIAGNOSIS — K219 Gastro-esophageal reflux disease without esophagitis: Secondary | ICD-10-CM | POA: Diagnosis present

## 2022-07-09 DIAGNOSIS — Z8249 Family history of ischemic heart disease and other diseases of the circulatory system: Secondary | ICD-10-CM | POA: Diagnosis not present

## 2022-07-09 DIAGNOSIS — Z7984 Long term (current) use of oral hypoglycemic drugs: Secondary | ICD-10-CM | POA: Diagnosis not present

## 2022-07-09 DIAGNOSIS — I5022 Chronic systolic (congestive) heart failure: Secondary | ICD-10-CM | POA: Diagnosis present

## 2022-07-09 DIAGNOSIS — I25810 Atherosclerosis of coronary artery bypass graft(s) without angina pectoris: Secondary | ICD-10-CM | POA: Diagnosis not present

## 2022-07-09 DIAGNOSIS — Y831 Surgical operation with implant of artificial internal device as the cause of abnormal reaction of the patient, or of later complication, without mention of misadventure at the time of the procedure: Secondary | ICD-10-CM | POA: Diagnosis not present

## 2022-07-09 DIAGNOSIS — Z91030 Bee allergy status: Secondary | ICD-10-CM | POA: Diagnosis not present

## 2022-07-09 DIAGNOSIS — E1151 Type 2 diabetes mellitus with diabetic peripheral angiopathy without gangrene: Secondary | ICD-10-CM | POA: Diagnosis present

## 2022-07-09 DIAGNOSIS — E1169 Type 2 diabetes mellitus with other specified complication: Secondary | ICD-10-CM | POA: Diagnosis not present

## 2022-07-09 DIAGNOSIS — I350 Nonrheumatic aortic (valve) stenosis: Secondary | ICD-10-CM | POA: Diagnosis not present

## 2022-07-09 DIAGNOSIS — N183 Chronic kidney disease, stage 3 unspecified: Secondary | ICD-10-CM | POA: Diagnosis present

## 2022-07-09 DIAGNOSIS — G8929 Other chronic pain: Secondary | ICD-10-CM | POA: Diagnosis present

## 2022-07-09 DIAGNOSIS — M1712 Unilateral primary osteoarthritis, left knee: Secondary | ICD-10-CM | POA: Diagnosis present

## 2022-07-09 DIAGNOSIS — I429 Cardiomyopathy, unspecified: Secondary | ICD-10-CM | POA: Diagnosis present

## 2022-07-09 DIAGNOSIS — E1142 Type 2 diabetes mellitus with diabetic polyneuropathy: Secondary | ICD-10-CM | POA: Diagnosis present

## 2022-07-09 DIAGNOSIS — I739 Peripheral vascular disease, unspecified: Secondary | ICD-10-CM | POA: Diagnosis not present

## 2022-07-09 DIAGNOSIS — M5416 Radiculopathy, lumbar region: Secondary | ICD-10-CM | POA: Diagnosis present

## 2022-07-09 DIAGNOSIS — M5441 Lumbago with sciatica, right side: Secondary | ICD-10-CM | POA: Diagnosis not present

## 2022-07-09 LAB — GLUCOSE, CAPILLARY
Glucose-Capillary: 157 mg/dL — ABNORMAL HIGH (ref 70–99)
Glucose-Capillary: 155 mg/dL — ABNORMAL HIGH (ref 70–99)
Glucose-Capillary: 135 mg/dL — ABNORMAL HIGH (ref 70–99)
Glucose-Capillary: 114 mg/dL — ABNORMAL HIGH (ref 70–99)

## 2022-07-09 LAB — LIPID PANEL
Cholesterol: 129 mg/dL (ref 0–200)
HDL: 28 mg/dL — ABNORMAL LOW (ref >40)
LDL Cholesterol: 83 mg/dL (ref 0–99)
Total CHOL/HDL Ratio: 4.6 RATIO
Triglycerides: 89 mg/dL (ref <150)
VLDL: 18 mg/dL (ref 0–40)

## 2022-07-09 LAB — ECHOCARDIOGRAM COMPLETE
AR max vel: 1.43 cm2
AV Area VTI: 1.49 cm2
AV Area mean vel: 1.34 cm2
AV Mean grad: 18.7 mmHg
AV Peak grad: 31.9 mmHg
Ao pk vel: 2.82 m/s
Area-P 1/2: 4.39 cm2
Calc EF: 51 %
Height: 71 in
MV VTI: 2.39 cm2
S' Lateral: 4 cm
Single Plane A2C EF: 50.1 %
Single Plane A4C EF: 56.1 %
Weight: 3410.96 oz

## 2022-07-09 LAB — CBC
HCT: 36.5 % — ABNORMAL LOW (ref 39.0–52.0)
Hemoglobin: 12.4 g/dL — ABNORMAL LOW (ref 13.0–17.0)
MCH: 31.6 pg (ref 26.0–34.0)
MCHC: 34 g/dL (ref 30.0–36.0)
MCV: 92.9 fL (ref 80.0–100.0)
Platelets: 223 10*3/uL (ref 150–400)
RBC: 3.93 MIL/uL — ABNORMAL LOW (ref 4.22–5.81)
RDW: 12.3 % (ref 11.5–15.5)
WBC: 8 10*3/uL (ref 4.0–10.5)
nRBC: 0 % (ref 0.0–0.2)

## 2022-07-09 LAB — LIPOPROTEIN A (LPA): Lipoprotein (a): 56.5 nmol/L — ABNORMAL HIGH (ref <75.0)

## 2022-07-09 MED ORDER — PERFLUTREN LIPID MICROSPHERE
1.0000 mL | INTRAVENOUS | Status: AC | PRN
  Administered 2022-07-09: 3 mL via INTRAVENOUS

## 2022-07-09 MED ORDER — EZETIMIBE 10 MG PO TABS
10.0000 mg | ORAL_TABLET | Freq: Every day | ORAL | 1 refills | Status: DC
  Filled 2022-07-09 – 2022-07-22 (×2): qty 30, 30d supply, fill #0

## 2022-07-09 MED ORDER — EZETIMIBE 10 MG PO TABS
10.0000 mg | ORAL_TABLET | Freq: Every day | ORAL | Status: DC
  Administered 2022-07-09 – 2022-07-10 (×2): 10 mg via ORAL
  Filled 2022-07-09 (×2): qty 1

## 2022-07-09 MED ORDER — ATORVASTATIN CALCIUM 80 MG PO TABS
80.0000 mg | ORAL_TABLET | Freq: Every day | ORAL | Status: DC
  Administered 2022-07-09: 80 mg via ORAL
  Filled 2022-07-09: qty 1

## 2022-07-09 MED FILL — Lidocaine HCl Local Preservative Free (PF) Inj 1%: INTRAMUSCULAR | Qty: 30 | Status: AC

## 2022-07-09 NOTE — Progress Notes (Signed)
Pt returned back from MRI. Tele called.

## 2022-07-09 NOTE — Progress Notes (Addendum)
Inpatient Rehab Admissions:  Inpatient Rehab Consult received.  I met with patient and wife [name] at the bedside for rehabilitation assessment and to discuss goals and expectations of an inpatient rehab admission.  Discussed average length of stay and discharge home after completion of CIR. Also discussed other rehab options. Both acknowledged understanding. Wife did acknowledge that she would be able to provide 24/7 support for pt after discharge. Pt unsure if he would like to pursue CIR. He and wife would like to discuss CIR versus other rehab options. Will continue to follow.  ADDENDUM 1345: Pt's wife [name] called and informed AC that she and pt have agreed to pursue CIR.    Signed: Gayland Curry, [place], [place] Admissions Coordinator [phone]

## 2022-07-09 NOTE — Progress Notes (Signed)
Physical Therapy Treatment Patient Details Name: Thomas [name] MRN: [id number] DOB: 19-Apr-1946 Today's Date: 07/09/2022   History of Present Illness Thomas [name]  is a 76 y.o. male who presented for ABDOMINAL AORTOGRAM W/LOWER EXTREMITY, and LEFT HEART CATH AND CORONARY ANGIOGRAPHY on 10/31. Pt also noted to have L side weakness prompting neuro consult. MRI revealed multiple scattered acute ischemic infarcts involving the cortical  and subcortical aspects of both cerebral hemispheres and cerebellum.  PMH:  hypertension, newly reduced ejection fraction, unstable angina, diabetes, OA of knee, HLD    PT Comments    Pt received in bed, agreeable to participation in therapy and eager to get OOB. He required mod assist supine to sit, and mod assist lateral scoot transfer bed to recliner toward R. LUE/LE weakness noted. Pt reporting decreased sensation on L. Pt benefits from simple instructions, and increased processing time. He angers quickly, if overstimulated or things are moving too fast. Pt in recliner with feet elevated at end of session.   Recommendations for follow up therapy are one component of a multi-disciplinary discharge planning process, led by the attending physician.  Recommendations may be updated based on patient status, additional functional criteria and insurance authorization.  Follow Up Recommendations  Acute inpatient rehab (3hours/day)     Assistance Recommended at Discharge Frequent or constant Supervision/Assistance  Patient can return home with the following A lot of help with walking and/or transfers;A lot of help with bathing/dressing/bathroom;Assistance with cooking/housework;Help with stairs or ramp for entrance   Equipment Recommendations  Other (comment) (TBD)    Recommendations for Other Services Rehab consult     Precautions / Restrictions Precautions Precautions: Fall     Mobility  Bed Mobility Overal bed mobility: Needs Assistance Bed Mobility:  Supine to Sit     Supine to sit: Mod assist     General bed mobility comments: assist with LLE and trunk, cues for sequencing, increased time    Transfers Overall transfer level: Needs assistance   Transfers: Bed to chair/wheelchair/BSC            Lateral/Scoot Transfers: Mod assist General transfer comment: lateral scoot transfer bed to recliner (with swing away armrest). Transferred toward R. Cues for sequencing. Increased time    Ambulation/Gait                   Stairs             Wheelchair Mobility    Modified Rankin (Stroke Patients Only) Modified Rankin (Stroke Patients Only) Pre-Morbid Rankin Score: No symptoms Modified Rankin: Moderately severe disability     Balance Overall balance assessment: Needs assistance Sitting-balance support: Feet supported, Single extremity supported Sitting balance-Leahy Scale: Fair                                      Cognition Arousal/Alertness: Awake/alert Behavior During Therapy: Agitated Overall Cognitive Status: No family/caregiver present to determine baseline cognitive functioning Area of Impairment: Safety/judgement, Awareness, Problem solving, Following commands                       Following Commands: Follows one step commands with increased time Safety/Judgement: Decreased awareness of safety, Decreased awareness of deficits Awareness: Emergent Problem Solving: Slow processing, Decreased initiation, Difficulty sequencing General Comments: Pt benefits from simple cues and increased time for processing. Quick to anger if overstimulated or staff is moving too fast.  Exercises      General Comments        Pertinent Vitals/Pain Pain Assessment Pain Assessment: Faces Faces Pain Scale: Hurts even more Pain Location: LLE with movement Pain Descriptors / Indicators: Discomfort, Tender Pain Intervention(s): Limited activity within patient's tolerance,  Repositioned    Home Living   Living Arrangements: Spouse/significant other Available Help at Discharge: Family;Available 24 hours/day Type of Home: House         Home Layout: One level        Prior Function            PT Goals (current goals can now be found in the care plan section) Acute Rehab PT Goals Patient Stated Goal: return home Progress towards PT goals: Progressing toward goals    Frequency    Min 4X/week      PT Plan Current plan remains appropriate    Co-evaluation              AM-PAC PT "6 Clicks" Mobility   Outcome Measure  Help needed turning from your back to your side while in a flat bed without using bedrails?: A Little Help needed moving from lying on your back to sitting on the side of a flat bed without using bedrails?: A Lot Help needed moving to and from a bed to a chair (including a wheelchair)?: A Lot Help needed standing up from a chair using your arms (e.g., wheelchair or bedside chair)?: A Lot Help needed to walk in hospital room?: Total Help needed climbing 3-5 steps with a railing? : Total 6 Click Score: 11    End of Session Equipment Utilized During Treatment: Gait belt Activity Tolerance: Patient tolerated treatment well Patient left: in chair;with call bell/phone within reach;with chair alarm set Nurse Communication: Mobility status PT Visit Diagnosis: Other abnormalities of gait and mobility (R26.89);Muscle weakness (generalized) (M62.81);Hemiplegia and hemiparesis Hemiplegia - Right/Left: Left Hemiplegia - dominant/non-dominant: Non-dominant Hemiplegia - caused by: Cerebral infarction     Time: 7628-3151 PT Time Calculation (min) (ACUTE ONLY): 28 min  Charges:  $Therapeutic Activity: 23-37 mins                     Lorrin Goodell, PT  Office # [phone] Pager [phone]    Lorriane Shire 07/09/2022, 12:50 PM

## 2022-07-09 NOTE — Progress Notes (Signed)
Subjective:  No new complaints overnight Weakness persists Numbness improving  Objective:  Vital Signs in the last 24 hours: Temp:  [97.9 F (36.6 C)-99.3 F (37.4 C)] 99.3 F (37.4 C) (11/02 1100) Pulse Rate:  [60-75] 65 (11/02 1100) Resp:  [16-19] 18 (11/02 1100) BP: (142-163)/(62-85) 144/76 (11/02 1100) SpO2:  [94 %-100 %] 98 % (11/01 2001)  Intake/Output from previous day: 11/01 0701 - 11/02 0700 In: -  Out: 850 [Urine:850]  Physical Exam Vitals and nursing note reviewed.  Constitutional:      General: He is not in acute distress. Neck:     Vascular: No JVD.  Cardiovascular:     Rate and Rhythm: Normal rate and regular rhythm.     Heart sounds: Normal heart sounds. No murmur heard. Pulmonary:     Effort: Pulmonary effort is normal.     Breath sounds: Normal breath sounds. No wheezing or rales.  Musculoskeletal:     Right lower leg: No edema.     Left lower leg: No edema.  Neurological:     Comments: LUE, LLE 4/5 strength  Sensory deficit improved       Imaging/tests reviewed and independently interpreted: CT head ordered   07/09/2022: MRI HEAD IMPRESSION: 1. Multiple scattered acute ischemic infarcts involving the cortical and subcortical aspects of both cerebral hemispheres and cerebellum. No associated hemorrhage or mass effect. A central thromboembolic etiology is likely given the various vascular distributions involved. 2. Underlying age-related cerebral atrophy with mild chronic small vessel ischemic disease.   MRA HEAD IMPRESSION: 1. Negative intracranial MRA for large vessel occlusion. 2. 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 3. Moderate multifocal stenoses about the left carotid siphon.    CT cerebral perfusion 12/11/2019: 1. No large vessel occlusion. 2. Intracranial atherosclerosis with moderate left ICA and severe right P2  stenoses. 3. Cervical carotid and vertebral artery atherosclerosis without stenosis. 4. Negative CTP. 5. Aortic Atherosclerosis (ICD10-I70.0).   Cardiac Studies:  Telemetry 07/08/2022: No significant arrhythmia  EKG 07/08/2022: Sinus rhythm LVH Nonspecific ST-T abnormality  Coronary intervention 07/07/2022: LM: Normal LAD: Prox 40%, followed by prox-mid 80% calcific stenosis         Successful percutaneous coronary intervention prox-mid LAD         Orbital atherectomy, PTCA and overlapping stents placement mid to prox LAD         3.5 X 24 mm and 3.5 X 12 mm Synergy drug-eluting stents         Post dilatation with 3.5X18 mm [place] balloon up to 22 atm Lcx: OM with moderate disease RCA: Prox 50% Mid calcific 75% stenosis. RFR 0.96   No significant disease in abdominal aorta, renal arteries, bilateral ileofemoral arteries. Severe tortuosity noted.   Will stage LE runoff and itnervention  Echocardiogram 02/26/2022:  1. Compared to 02/20/22, pericardial effusion appears to be similar in  size; no tamponade physiology.   2. Left ventricular ejection fraction, by estimation, is 45 to 50%. The  left ventricle has mildly decreased function. The left ventricle  demonstrates global hypokinesis. There is severe concentric left  ventricular hypertrophy. Left ventricular diastolic   parameters are consistent with Grade I diastolic dysfunction (impaired  relaxation). Elevated left atrial pressure.   3. Right ventricular systolic function is normal. The right ventricular  size is normal.   4. Left atrial size was mildly dilated.   5. Moderate pericardial effusion.   6. The mitral valve  is normal in structure. No evidence of mitral valve  regurgitation. No evidence of mitral stenosis.   7. The aortic valve is tricuspid. Aortic valve regurgitation is not  visualized. Mild to moderate aortic valve stenosis.   8. The inferior vena cava is normal in size with greater than 50%  respiratory  variability, suggesting right atrial pressure of 3 mmHg.    Assessment & Recommendations:  76 y/o Caucasian male with HF with mildly reduced EF, CAD with angina in medical therapy, PAD with severe claudication.   Stroke: Multifocal stroke, periprocedural Left sided sensory deficit improved. Weakness persists Continue Aspirin plavix No permissive hypertension needed at this time, as per Neurology. Will likely need inpatient rehab Continue Lipitor 80 mg daily. Will add Zetia 10 mg daily.   CAD: S/p prox-mid LAD PCI 07/07/2022 Recommend Aspirin and plavix for at least 6 months Continue baseline anti anginal therapy, high intensity statin. Will add Zetia 10 mg daily.   PAD: Severe claudication. Continue medical management. Will consider outpatient revascularization.     Nigel Mormon, MD Pager: [phone] Office: [phone]

## 2022-07-09 NOTE — Progress Notes (Signed)
Carotid duplex bilateral study completed.   Please see CV Proc for preliminary results.   Darlin Coco, RDMS, RVT

## 2022-07-09 NOTE — Evaluation (Signed)
Speech Language Pathology Evaluation Patient Details Name: Thomas [name] MRN: [id number] DOB: [birth year] Today's Date: 07/09/2022 Time: 3428-7681 SLP Time Calculation (min) (ACUTE ONLY): 12 min  Problem List:  Patient Active Problem List   Diagnosis Date Noted   CAD (coronary artery disease) 07/07/2022   Bilateral renal cysts 15/72/6203   Chronic systolic (congestive) heart failure ([place]) 05/06/2022   Aortic valve disorder 05/06/2022   Unstable angina ([place]) 05/06/2022   Regional wall motion abnormality of heart 05/06/2022   PAD (peripheral artery disease) ([place]) 55/97/4163   Acute systolic heart failure ([place]) 05/06/2022   Pericardial effusion 12/24/2021   Pleural effusion 11/04/2021   Pulmonary edema 11/04/2021   Incidental pulmonary nodule, > 9m and < 8106m02/28/2023   Spinal stenosis at L4-L5 level 10/01/2021   Peripheral arterial disease (HC[facility]01/25/2023   Type 2 diabetes mellitus with diabetic neuropathy, unspecified ([facility]CT[place]01/20/2023   Sensorineural hearing loss, bilateral 09/26/2021   Personal history of methicillin resistant Staphylococcus aureus 09/26/2021   Degeneration of lumbar or lumbosacral intervertebral disc 09/26/2021   Tobacco dependence 09/26/2021   Excessive daytime sleepiness 11/13/2020   Narcotic habituation, continuous (HC[facility]03/05/2021   At risk for central sleep apnea 11/13/2020   History of claustrophobia 11/13/2020   Obesity (BMI 30.0-34.9) 11/13/2020   Nocturia 11/13/2020   PTSD (post-traumatic stress disorder) 11/13/2020   Cognitive and behavioral changes 12/28/2019   Essential hypertension    Acute metabolic encephalopathy    Coronary artery disease 02/11/2018   Claudication in peripheral vascular disease (HCMont[facility]06/03/2018   Atypical chest pain 01/12/2018   Coronary artery calcification 01/07/2018   CKD (chronic kidney disease) stage 3, GFR 30-59 ml/min (HCC) 11/25/2017   Chronic diastolic heart failure ([facility]CH[facility]09/02/2017   Aortic stenosis, mild  05/13/2017   Low back pain 05/12/2017   GERD (gastroesophageal reflux disease) 05/12/2017   Prostate nodule 05/12/2017   Depression 05/12/2017   Hx of colonic polyps 05/12/2017   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/2017   Hyperlipidemia 05/12/2017   Hypertensive heart disease with heart failure (HCAl[facility]09/01/2017   Osteoarthritis of left knee 05/12/2017   Sleep apnea 05/12/2017   Actinic keratosis 05/12/2017   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   Seborrheic dermatitis 05/12/2017   Paresthesia 05/12/2017   Past Medical History:  Past Medical History:  Diagnosis Date   Actinic keratosis 05/12/2017   Back pain    Chronic systolic (congestive) heart failure (HCChewelah8/30/2023   Depression 05/12/2017   Diabetes ([facility]CR[facility] [place]   GERD (gastroesophageal reflux disease) 05/12/2017   Hx of colonic polyps 05/12/2017   Hyperlipidemia 05/12/2017   Hypertension    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/2017   Low back pain 05/12/2017   Lumbar radiculopathy 05/12/2017   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   Obesity 05/12/2017   Osteoarthritis of knee 05/12/2017   Paresthesia 05/12/2017   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   Prostate nodule 05/12/2017   Right rotator cuff tear 10/01/2021   Seborrheic dermatitis 05/12/2017   Past Surgical History:  Past Surgical History:  Procedure Laterality Date   ABDOMINAL AORTOGRAM W/LOWER EXTREMITY N/A 07/07/2022   Procedure: ABDOMINAL AORTOGRAM W/LOWER EXTREMITY;  Surgeon: PaNigel MormonMD;  [facility];  Service: Cardiovascular;  Laterality: N/A;   CORONARY ATHERECTOMY N/A 07/07/2022   Procedure: CORONARY ATHERECTOMY;  Surgeon: PaNigel MormonMD;  [facility];  Service: Cardiovascular;  Laterality: N/A;   CORONARY STENT INTERVENTION N/A 07/07/2022   Procedure: CORONARY STENT INTERVENTION;  Surgeon: PaNigel Mormon  MD;  Location: [place] [facility];  Service: Cardiovascular;  Laterality: N/A;   HAND SURGERY Right     INTRAVASCULAR PRESSURE WIRE/FFR STUDY N/A 07/07/2022   Procedure: INTRAVASCULAR PRESSURE WIRE/FFR STUDY;  Surgeon: Nigel Mormon, MD;  Location: [facility] [facility];  Service: Cardiovascular;  Laterality: N/A;   LEFT HEART CATH AND CORONARY ANGIOGRAPHY N/A 07/07/2022   Procedure: LEFT HEART CATH AND CORONARY ANGIOGRAPHY;  Surgeon: Nigel Mormon, MD;  Location: [facility];  Service: Cardiovascular;  Laterality: N/A;   ORIF ANKLE FRACTURE Left 08/15/2007   Distal fibular   REPLACEMENT TOTAL KNEE Right 07/12/2012   RIGHT/LEFT HEART CATH AND CORONARY ANGIOGRAPHY N/A 01/12/2018   Procedure: RIGHT/LEFT HEART CATH AND CORONARY ANGIOGRAPHY;  Surgeon: Belva Crome, MD;  Location: [place] [facility];  Service: Cardiovascular;  Laterality: N/A;   RIGHT/LEFT HEART CATH AND CORONARY ANGIOGRAPHY N/A 05/19/2022   Procedure: RIGHT/LEFT HEART CATH AND CORONARY ANGIOGRAPHY;  Surgeon: Nigel Mormon, MD;  Location: [place] [facility];  Service: Cardiovascular;  Laterality: N/A;   SHOULDER SURGERY Right    UMBILICAL HERNIA REPAIR  06/23/2013   HPI:  Thomas [name]  is a 76 y.o. male who presented for Abdominal Aortogram w/lower extremity, and left heart cath and coronary angiography on 10/31. Pt with past medical history significant for hypertension, newly reduced ejection fraction, unstable angina, diabetes, OA of knee, HLD.  MRI was remarkable for "multiple scattered acute ischemic infarcts involving the cortical and subcortical aspects of both cerebral hemispheres and cerebellum."   Assessment / Plan / Recommendation Clinical Impression  Pt was seen for a cognitive-linguistic evaluation and he presents with cognitive impairments in the areas of short-term memory, sustained attention, and problem solving.  Pt completed portions of the [place] Status (SLUMS) examination in addition to informal evaluation measures as he declined to complete the full SLUMS.  Pt reported  that he was fully independent with IADLs at baseline including medication and financial management.  Expressive and receptive language appear functional given limited examination and no dysarthria was observed.  Recommend continuation of ST and supervision with IADLs at time of discharge.     SLP Assessment  SLP Recommendation/Assessment: Patient needs continued Speech [place] Pathology Services SLP Visit Diagnosis: Cognitive communication deficit (R41.841)    Recommendations for follow up therapy are one component of a multi-disciplinary discharge planning process, led by the attending physician.  Recommendations may be updated based on patient status, additional functional criteria and insurance authorization.    Follow Up Recommendations  Acute inpatient rehab (3hours/day)    Assistance Recommended at Discharge  Intermittent Supervision/Assistance  Functional Status Assessment Patient has had a recent decline in their functional status and demonstrates the ability to make significant improvements in function in a reasonable and predictable amount of time.  Frequency and Duration min 2x/week  2 weeks      SLP Evaluation Cognition  Overall Cognitive Status: No family/caregiver present to determine baseline cognitive functioning Arousal/Alertness: Awake/alert Orientation Level: Oriented X4 Attention: Sustained;Focused Focused Attention: Appears intact Sustained Attention: Impaired Sustained Attention Impairment: Verbal basic;Verbal complex Memory: Impaired Memory Impairment: Decreased recall of new information;Decreased short term memory Decreased Short Term Memory: Verbal basic Awareness: Impaired Awareness Impairment: Emergent impairment Problem Solving: Impaired Problem Solving Impairment: Verbal complex;Functional complex       Comprehension  Auditory Comprehension Overall Auditory Comprehension: Appears within functional limits for tasks assessed    Expression  Expression Primary Mode of Expression: Verbal Verbal  Expression Overall Verbal Expression: Appears within functional limits for tasks assessed   Oral / Motor  Oral Motor/Sensory Function Overall Oral Motor/Sensory Function: Other (comment) (Pt declined OME) Motor Speech Overall Motor Speech: Appears within functional limits for tasks assessed           Bretta Bang, M.S., [place] Acute Rehabilitation Services Office: [phone]  [place] 07/09/2022, 11:01 AM

## 2022-07-09 NOTE — Progress Notes (Signed)
CARDIAC REHAB PHASE I     Pt just back to bed after sitting in chair. Feeling well today. Reviewed education provided yesterday with pt and wife. All questions and concerns addressed. Working with OT and PT for mobility needs. Work up for possible CIR placement. Will sign off  for now.   [id number] Vanessa Barbara, RN BSN 07/09/2022 1:47 PM

## 2022-07-09 NOTE — Plan of Care (Signed)
Problem: Education: Goal: Understanding of CV disease, CV risk reduction, and recovery process will improve Outcome: Progressing Goal: Individualized Educational Video(s) Outcome: Progressing   Problem: Activity: Goal: Ability to return to baseline activity level will improve Outcome: Progressing   Problem: Cardiovascular: Goal: Ability to achieve and maintain adequate cardiovascular perfusion will improve Outcome: Progressing Goal: Vascular access site(s) Level 0-1 will be maintained Outcome: Progressing   Problem: Health Behavior/Discharge Planning: Goal: Ability to safely manage health-related needs after discharge will improve Outcome: Progressing   Problem: Education: Goal: Knowledge of cardiac device and self-care will improve Outcome: Progressing Goal: Ability to safely manage health related needs after discharge will improve Outcome: Progressing Goal: Individualized Educational Video(s) Outcome: Progressing   Problem: Cardiac: Goal: Ability to achieve and maintain adequate cardiopulmonary perfusion will improve Outcome: Progressing   Problem: Education: Goal: Knowledge of General Education information will improve Description: Including pain rating scale, medication(s)/side effects and non-pharmacologic comfort measures Outcome: Progressing   Problem: Health Behavior/Discharge Planning: Goal: Ability to manage health-related needs will improve Outcome: Progressing   Problem: Clinical Measurements: Goal: Ability to maintain clinical measurements within normal limits will improve Outcome: Progressing Goal: Will remain free from infection Outcome: Progressing Goal: Diagnostic test results will improve Outcome: Progressing Goal: Respiratory complications will improve Outcome: Progressing Goal: Cardiovascular complication will be avoided Outcome: Progressing   Problem: Activity: Goal: Risk for activity intolerance will decrease Outcome: Progressing    Problem: Nutrition: Goal: Adequate nutrition will be maintained Outcome: Progressing   Problem: Coping: Goal: Level of anxiety will decrease Outcome: Progressing   Problem: Elimination: Goal: Will not experience complications related to bowel motility Outcome: Progressing Goal: Will not experience complications related to urinary retention Outcome: Progressing   Problem: Pain Managment: Goal: General experience of comfort will improve Outcome: Progressing   Problem: Safety: Goal: Ability to remain free from injury will improve Outcome: Progressing   Problem: Skin Integrity: Goal: Risk for impaired skin integrity will decrease Outcome: Progressing   Problem: Education: Goal: Knowledge of General Education information will improve Description: Including pain rating scale, medication(s)/side effects and non-pharmacologic comfort measures Outcome: Progressing   Problem: Health Behavior/Discharge Planning: Goal: Ability to manage health-related needs will improve Outcome: Progressing   Problem: Clinical Measurements: Goal: Ability to maintain clinical measurements within normal limits will improve Outcome: Progressing Goal: Will remain free from infection Outcome: Progressing Goal: Diagnostic test results will improve Outcome: Progressing Goal: Respiratory complications will improve Outcome: Progressing Goal: Cardiovascular complication will be avoided Outcome: Progressing   Problem: Activity: Goal: Risk for activity intolerance will decrease Outcome: Progressing   Problem: Nutrition: Goal: Adequate nutrition will be maintained Outcome: Progressing   Problem: Coping: Goal: Level of anxiety will decrease Outcome: Progressing   Problem: Elimination: Goal: Will not experience complications related to bowel motility Outcome: Progressing Goal: Will not experience complications related to urinary retention Outcome: Progressing   Problem: Pain Managment: Goal:  General experience of comfort will improve Outcome: Progressing   Problem: Safety: Goal: Ability to remain free from injury will improve Outcome: Progressing   Problem: Skin Integrity: Goal: Risk for impaired skin integrity will decrease Outcome: Progressing   Problem: Education: Goal: Ability to describe self-care measures that may prevent or decrease complications (Diabetes Survival Skills Education) will improve Outcome: Progressing Goal: Individualized Educational Video(s) Outcome: Progressing   Problem: Coping: Goal: Ability to adjust to condition or change in health will improve Outcome: Progressing   Problem: Fluid Volume: Goal: Ability to maintain a balanced intake and output will improve Outcome: Progressing  Problem: Health Behavior/Discharge Planning: Goal: Ability to identify and utilize available resources and services will improve Outcome: Progressing Goal: Ability to manage health-related needs will improve Outcome: Progressing

## 2022-07-09 NOTE — Progress Notes (Addendum)
STROKE TEAM PROGRESS NOTE   ATTENDING NOTE: I reviewed above note and agree with the assessment and plan. Pt was seen and examined.   76 year old male with history of hypertension, CAD, cardiomyopathy status post PCI with cardiac stent 07/07/2022.  Found to have left-sided weakness, left hand weakness and confusion postprocedure.  CT no acute abnormality.  MRI showed bilateral embolic shower, right more than left, larger than right CR.  EF 50 to 55%, MRA head bilateral M2 and P2 stenosis, left siphon atherosclerosis.  Carotid Doppler unremarkable.  LDL 83, A1c 7.0.  Creatinine 1.37.  On exam, wife at bedside.  Patient lying bed, mildly lethargic, however awake alert, follows all commands, mild psychomotor slowing, but no aphasia, able to name repeat.  Mild left facial droop, no gaze palsy, visual field full.  Left upper extremity 3/5, left lower extremity 4/5 proximal and 5/5 distal.  Right upper and lower extremity at least 5/5.  Sensation symmetrical and right finger-to-nose intact.  Etiology for patient stroke most likely related to cardiac procedure, although other cardioembolic source cannot be completely ruled out.  Recommend 30-day cardiac event monitoring as outpatient.  Continue DAPT per cardiology.  Continue Lipitor 80.  PT/OT recommend CIR.  For detailed assessment and plan, please refer to above/below as I have made changes wherever appropriate.   Neurology will sign off. Please call with questions. Pt will follow up with stroke clinic NP at [facility] in about 4 weeks. Thanks for the consult.   Rosalin Hawking, MD PhD Stroke Neurology 07/09/2022 7:10 PM    INTERVAL HISTORY His wife is at  is at the bedside and states his mental status has improved in the last 24 hours.Patient is sitting the recliner and feeding himself. He stated wanting to go home. Still has LUE/LLE weakness. His left hand weakness with some improvement and still having difficulty with fine motor  He has passed swallowing  evaluation . He has been evaluated by PT and required moderate assist supine to sit and lateral movements. They have recommended Rehab placement MRI Brain completed  today .MRA negative for LVO. MRI Brain  shows multiple scattered foci of restricted diffusion are seen involving the cortical and subcortical aspects of both cerebral hemispheres, consistent with acute ischemic infarcts. The largest of these is seen at the right posterior corona radiata and measures 1.6 cm There  is a small infarct involving the right thalamus Single small infratentorial infarct at the right cerebellum  No associated hemorrhage. Echocardiogram competed with LVEF 50-55% and left atrial size was moderately dilated. He is on DAPT Therapy ASA 81 mg qday , Plavix 75 mg qday.      Vitals:   07/09/22 0739 07/09/22 0800 07/09/22 1100 07/09/22 1500  BP: (!) 161/85 (!) 145/73 (!) 144/76 (!) 170/83  Pulse: 75 71 65 70  Resp: _0 Temp: 98.3 F (36.8 C) 99 F (37.2 C) 99.3 F (37.4 C) 98.7 F (37.1 C)  TempSrc: Oral Oral Oral Oral  SpO2:      Weight:      Height:       CBC:  Recent Labs  Lab 07/08/22 0316 07/09/22 0230  WBC 9.3 8.0  HGB 13.1 12.4*  HCT 37.3* 36.5*  MCV 92.6 92.9  PLT 219 409   Basic Metabolic Panel:  Recent Labs  Lab 07/07/22 0819 07/08/22 0316  NA 140 139  K 4.1 3.6  CL 109 112*  CO2 23 21*  GLUCOSE 217* 166*  BUN 24* 23  CREATININE 1.48* 1.37*  CALCIUM 8.7* 8.7*   Lipid Panel:  Recent Labs  Lab 07/09/22 0230  CHOL 129  TRIG 89  HDL 28*  CHOLHDL 4.6  VLDL 18  LDLCALC 83   HgbA1c: No results for input(s): "HGBA1C" in the last 168 hours. Urine Drug Screen: No results for input(s): "LABOPIA", "COCAINSCRNUR", "LABBENZ", "AMPHETMU", "THCU", "LABBARB" in the last 168 hours.  Alcohol Level No results for input(s): "ETH" in the last 168 hours.  IMAGING past 24 hours ECHOCARDIOGRAM COMPLETE  Result Date: 07/09/2022    ECHOCARDIOGRAM REPORT   Patient Name:    Thomas [name] Date of Exam: 07/09/2022 Medical Rec #:  [id number]     Height:       71.0 in Accession #:    [id number]    Weight:       213.2 lb Date of Birth:  1945/11/17     BSA:          2.167 m Patient Age:    89 years      BP:           145/73 mmHg Patient Gender: M             HR:           71 bpm. Exam Location:  Inpatient Procedure: 2D Echo, Cardiac Doppler, Color Doppler and Intracardiac            Opacification Agent Indications:     Stroke I63.9  History:         Patient has prior history of Echocardiogram examinations, most                  recent 02/26/2022. CAD, PAD; Risk Factors:Hypertension. PCA to                  the LAD on 07/07/22.  Sonographer:     Darlina Sicilian RDCS Referring Phys:  [id number] ASHISH ARORA Diagnosing Phys: Vernell Leep MD IMPRESSIONS  1. Left ventricular ejection fraction, by estimation, is 50 to 55%. The left ventricle has low normal function. The left ventricle has no regional wall motion abnormalities. There is moderate left ventricular hypertrophy. Left ventricular diastolic parameters are consistent with Grade II diastolic dysfunction (pseudonormalization). Elevated left atrial pressure.  2. Right ventricular systolic function is normal. The right ventricular size is normal.  3. Left atrial size was moderately dilated.  4. Moderate pericardial effusion. The pericardial effusion is posterior to the left ventricle. There is no evidence of cardiac tamponade.  5. The mitral valve is normal in structure. No evidence of mitral valve regurgitation. No evidence of mitral stenosis.  6. The aortic valve is calcified. Aortic valve regurgitation is not visualized. Moderate aortic valve stenosis. Aortic valve area, by VTI measures 1.49 cm. Aortic valve mean gradient measures 18.7 mmHg. Aortic valve Vmax measures 2.82 m/s. Comparison(s): Previous study on 02/26/2022 reported LVEF 21-97%, grade 1 diastolic dysfunction. FINDINGS  Left Ventricle: Left ventricular ejection fraction, by  estimation, is 50 to 55%. The left ventricle has low normal function. The left ventricle has no regional wall motion abnormalities. Definity contrast agent was given IV to delineate the left ventricular endocardial borders. The left ventricular internal cavity size was normal in size. There is moderate left ventricular hypertrophy. Left ventricular diastolic parameters are consistent with Grade II diastolic dysfunction (pseudonormalization).  Elevated left atrial pressure. Right Ventricle: The right ventricular size is normal. No increase in right ventricular wall thickness. Right ventricular systolic function is normal. Left  Atrium: Left atrial size was moderately dilated. Right Atrium: Right atrial size was normal in size. Pericardium: A moderately sized pericardial effusion is present. The pericardial effusion is posterior to the left ventricle. There is no evidence of cardiac tamponade. Mitral Valve: The mitral valve is normal in structure. No evidence of mitral valve regurgitation. No evidence of mitral valve stenosis. MV peak gradient, 4.4 mmHg. The mean mitral valve gradient is 2.0 mmHg. Tricuspid Valve: The tricuspid valve is normal in structure. Tricuspid valve regurgitation is not demonstrated. No evidence of tricuspid stenosis. Aortic Valve: The aortic valve is calcified. Aortic valve regurgitation is not visualized. Moderate aortic stenosis is present. Aortic valve mean gradient measures 18.7 mmHg. Aortic valve peak gradient measures 31.9 mmHg. Aortic valve area, by VTI measures 1.49 cm. Pulmonic Valve: The pulmonic valve was normal in structure. Pulmonic valve regurgitation is not visualized. No evidence of pulmonic stenosis. Aorta: The aortic root is normal in size and structure. IAS/Shunts: The interatrial septum was not assessed.  LEFT VENTRICLE PLAX 2D LVIDd:         5.60 cm      Diastology LVIDs:         4.00 cm      LV e' medial:    3.45 cm/s LV PW:         1.30 cm      LV E/e' medial:  31.6 LV  IVS:        1.50 cm      LV e' lateral:   4.73 cm/s LVOT diam:     2.30 cm      LV E/e' lateral: 23.0 LV SV:         85 LV SV Index:   39 LVOT Area:     4.15 cm  LV Volumes (MOD) LV vol d, MOD A2C: 146.0 ml LV vol d, MOD A4C: 183.0 ml LV vol s, MOD A2C: 72.8 ml LV vol s, MOD A4C: 80.4 ml LV SV MOD A2C:     73.2 ml LV SV MOD A4C:     183.0 ml LV SV MOD BP:      86.3 ml RIGHT VENTRICLE TAPSE (M-mode): 1.6 cm LEFT ATRIUM             Index        RIGHT ATRIUM           Index LA diam:        4.70 cm 2.17 cm/m   RA Area:     18.60 cm LA Vol (A2C):   88.3 ml 40.76 ml/m  RA Volume:   45.30 ml  20.91 ml/m LA Vol (A4C):   97.2 ml 44.86 ml/m LA Biplane Vol: 92.7 ml 42.79 ml/m  AORTIC VALVE AV Area (Vmax):    1.43 cm AV Area (Vmean):   1.34 cm AV Area (VTI):     1.49 cm AV Vmax:           282.33 cm/s AV Vmean:          199.333 cm/s AV VTI:            0.571 m AV Peak Grad:      31.9 mmHg AV Mean Grad:      18.7 mmHg LVOT Vmax:         97.30 cm/s LVOT Vmean:        64.300 cm/s LVOT VTI:          0.205 m LVOT/AV VTI ratio: 0.36  AORTA Ao Root  diam: 3.30 cm Ao Asc diam:  3.30 cm MITRAL VALVE MV Area (PHT): 4.39 cm     SHUNTS MV Area VTI:   2.39 cm     Systemic VTI:  0.20 m MV Peak grad:  4.4 mmHg     Systemic Diam: 2.30 cm MV Mean grad:  2.0 mmHg MV Vmax:       1.05 m/s MV Vmean:      68.3 cm/s MV Decel Time: 173 msec MV E velocity: 109.00 cm/s MV A velocity: 94.70 cm/s MV E/A ratio:  1.15 Manish Patwardhan MD Electronically signed by Vernell Leep MD Signature Date/Time: 07/09/2022/12:42:15 PM    Final    VAS US CAROTID  Result Date: 07/09/2022 Carotid Arterial Duplex Study Patient Name:  ARA MANO  Date of Exam:   07/09/2022 Medical Rec #: [id number]      Accession #:    [id number] Date of Birth: 08/25/1946      Patient Gender: M Patient Age:   49 years Exam Location:  [facility] Procedure:      VAS US CAROTID Referring Phys: Amie Portland  --------------------------------------------------------------------------------  Indications:       CVA. Risk Factors:      Hypertension, hyperlipidemia, Diabetes, current smoker,                    coronary artery disease, PAD. Limitations        Today's exam was limited due to patient movement.                    Interference due to saline flush during exam. Comparison Study:  No prior studies. Performing Technologist: Darlin Coco RDMS, RVT  Examination Guidelines: A complete evaluation includes B-mode imaging, spectral Doppler, color Doppler, and power Doppler as needed of all accessible portions of each vessel. Bilateral testing is considered an integral part of a complete examination. Limited examinations for reoccurring indications may be performed as noted.  Right Carotid Findings: +----------+--------+--------+--------+------------------+--------+           PSV cm/sEDV cm/sStenosisPlaque DescriptionComments +----------+--------+--------+--------+------------------+--------+ CCA Prox  98      9                                          +----------+--------+--------+--------+------------------+--------+ CCA Distal67      12                                         +----------+--------+--------+--------+------------------+--------+ ICA Prox  49      11      1-39%   heterogenous               +----------+--------+--------+--------+------------------+--------+ ICA Distal59      14                                         +----------+--------+--------+--------+------------------+--------+ ECA       99                                                 +----------+--------+--------+--------+------------------+--------+ +----------+--------+-------+----------------+-------------------+  PSV cm/sEDV cmsDescribe        Arm Pressure (mmHG) +----------+--------+-------+----------------+-------------------+ Subclavian132            Multiphasic, WNL                     +----------+--------+-------+----------------+-------------------+ +---------+--------+--+--------+-+---------+ VertebralPSV cm/s65EDV cm/s7Antegrade +---------+--------+--+--------+-+---------+  Left Carotid Findings: +----------+--------+--------+--------+------------------+------------------+           PSV cm/sEDV cm/sStenosisPlaque DescriptionComments           +----------+--------+--------+--------+------------------+------------------+ CCA Prox  111     8                                                    +----------+--------+--------+--------+------------------+------------------+ CCA Distal93      13                                intimal thickening +----------+--------+--------+--------+------------------+------------------+ ICA Prox  74      13      1-39%   heterogenous                         +----------+--------+--------+--------+------------------+------------------+ ICA Distal62      15                                                   +----------+--------+--------+--------+------------------+------------------+ ECA       182                                                          +----------+--------+--------+--------+------------------+------------------+ +----------+--------+--------+----------------+-------------------+           PSV cm/sEDV cm/sDescribe        Arm Pressure (mmHG) +----------+--------+--------+----------------+-------------------+ [id number]             Multiphasic, WNL                    +----------+--------+--------+----------------+-------------------+ +---------+--------+--+--------+-+---------+ VertebralPSV cm/s54EDV cm/s9Antegrade +---------+--------+--+--------+-+---------+   Summary: Right Carotid: Velocities in the right ICA are consistent with a 1-39% stenosis. Left Carotid: Velocities in the left ICA are consistent with a 1-39% stenosis. Vertebrals:  Bilateral vertebral arteries demonstrate  antegrade flow. Subclavians: Normal flow hemodynamics were seen in bilateral subclavian              arteries. *See table(s) above for measurements and observations.     Preliminary    MR BRAIN WO CONTRAST  Result Date: 07/09/2022 CLINICAL DATA:  Initial evaluation for neuro deficit, stroke suspected. EXAM: MRI HEAD WITHOUT CONTRAST MRA HEAD WITHOUT CONTRAST TECHNIQUE: Multiplanar, multi-echo pulse sequences of the brain and surrounding structures were acquired without intravenous contrast. Angiographic images of the Circle of Willis were acquired using MRA technique without intravenous contrast. COMPARISON:  Prior CT from 07/08/2022. FINDINGS: MRI HEAD FINDINGS Brain: Generalized age-related cerebral atrophy. Patchy T2/FLAIR hyperintensity involving the periventricular and deep white matter both cerebral hemispheres, most consistent with chronic small vessel ischemic disease, mild in nature. Multiple scattered foci of restricted diffusion  are seen involving the cortical and subcortical aspects of both cerebral hemispheres, consistent with acute ischemic infarcts. The largest of these is seen at the right posterior corona radiata and measures 1.6 cm (series 5, image 91). Small infarct involving the right thalamus noted (series 5, image 84). Single small infratentorial infarct at the right cerebellum (series 5, image 70). No associated hemorrhage or mass effect. Gray-white matter differentiation otherwise maintained. No visible acute or chronic intracranial blood products. No mass lesion, midline shift or mass effect no hydrocephalus or extra-axial fluid collection. Pituitary gland and suprasellar region within normal limits. Vascular: Major intracranial vascular flow voids are maintained. Skull and upper cervical spine: Craniocervical junction within normal limits. Bone marrow signal intensity grossly normal. No scalp soft tissue abnormality. Sinuses/Orbits: Prior ocular lens replacement on the right. Mild  scattered mucosal thickening noted about the ethmoidal air cells and maxillary sinuses. No mastoid effusion. Other: None. MRA HEAD FINDINGS Anterior circulation: Examination degraded by motion. Visualized distal cervical segments of the internal carotid arteries are patent with antegrade flow. Petrous segments patent bilaterally. Atheromatous change seen throughout the carotid siphons bilaterally. There is associated up to moderate stenoses about the left siphon (series 1042, image 4). No significant stenosis about the contralateral right carotid siphon. A1 segments patent. Normal anterior communicating artery complex. Both anterior cerebral arteries patent without high-grade stenosis. M1 segments patent bilaterally. Right M1 bifurcates early. Severe proximal M2 stenoses noted (series 1042, image 9). Additional diffuse small vessel atheromatous irregularity throughout the MCA branches. Posterior circulation: Both vertebral arteries patent without stenosis. Left vertebral artery dominant. Both PICA patent. Basilar patent to its distal aspect without stenosis. Superior cerebral arteries patent bilaterally. Both PCAs primarily supplied via the basilar. Atheromatous irregularity throughout both PCAs with associated moderate multifocal P2 stenoses, left worse than right (series 1036, image 2). PCAs remain patent to their distal aspects. Anatomic variants: None significant.  No visible aneurysm. IMPRESSION: MRI HEAD IMPRESSION: 1. Multiple scattered acute ischemic infarcts involving the cortical and subcortical aspects of both cerebral hemispheres and cerebellum. No associated hemorrhage or mass effect. A central thromboembolic etiology is likely given the various vascular distributions involved. 2. Underlying age-related cerebral atrophy with mild chronic small vessel ischemic disease. MRA HEAD IMPRESSION: 1. Negative intracranial MRA for large vessel occlusion. 2. Moderate to advanced intracranial atherosclerotic  disease, with most notable findings including moderate to severe bilateral M2 and P2 stenoses. 3. Moderate multifocal stenoses about the left carotid siphon. Electronically Signed   By: Jeannine Boga M.D.   On: 07/09/2022 05:43   MR ANGIO HEAD WO CONTRAST  Result Date: 07/09/2022 CLINICAL DATA:  Initial evaluation for neuro deficit, stroke suspected. EXAM: MRI HEAD WITHOUT CONTRAST MRA HEAD WITHOUT CONTRAST TECHNIQUE: Multiplanar, multi-echo pulse sequences of the brain and surrounding structures were acquired without intravenous contrast. Angiographic images of the Circle of Willis were acquired using MRA technique without intravenous contrast. COMPARISON:  Prior CT from 07/08/2022. FINDINGS: MRI HEAD FINDINGS Brain: Generalized age-related cerebral atrophy. Patchy T2/FLAIR hyperintensity involving the periventricular and deep white matter both cerebral hemispheres, most consistent with chronic small vessel ischemic disease, mild in nature. Multiple scattered foci of restricted diffusion are seen involving the cortical and subcortical aspects of both cerebral hemispheres, consistent with acute ischemic infarcts. The largest of these is seen at the right posterior corona radiata and measures 1.6 cm (series 5, image 91). Small infarct involving the right thalamus noted (series 5, image 84). Single small infratentorial infarct at the right cerebellum (series 5, image  70). No associated hemorrhage or mass effect. Gray-white matter differentiation otherwise maintained. No visible acute or chronic intracranial blood products. No mass lesion, midline shift or mass effect no hydrocephalus or extra-axial fluid collection. Pituitary gland and suprasellar region within normal limits. Vascular: Major intracranial vascular flow voids are maintained. Skull and upper cervical spine: Craniocervical junction within normal limits. Bone marrow signal intensity grossly normal. No scalp soft tissue abnormality.  Sinuses/Orbits: Prior ocular lens replacement on the right. Mild scattered mucosal thickening noted about the ethmoidal air cells and maxillary sinuses. No mastoid effusion. Other: None. MRA HEAD FINDINGS Anterior circulation: Examination degraded by motion. Visualized distal cervical segments of the internal carotid arteries are patent with antegrade flow. Petrous segments patent bilaterally. Atheromatous change seen throughout the carotid siphons bilaterally. There is associated up to moderate stenoses about the left siphon (series 1042, image 4). No significant stenosis about the contralateral right carotid siphon. A1 segments patent. Normal anterior communicating artery complex. Both anterior cerebral arteries patent without high-grade stenosis. M1 segments patent bilaterally. Right M1 bifurcates early. Severe proximal M2 stenoses noted (series 1042, image 9). Additional diffuse small vessel atheromatous irregularity throughout the MCA branches. Posterior circulation: Both vertebral arteries patent without stenosis. Left vertebral artery dominant. Both PICA patent. Basilar patent to its distal aspect without stenosis. Superior cerebral arteries patent bilaterally. Both PCAs primarily supplied via the basilar. Atheromatous irregularity throughout both PCAs with associated moderate multifocal P2 stenoses, left worse than right (series 1036, image 2). PCAs remain patent to their distal aspects. Anatomic variants: None significant.  No visible aneurysm. IMPRESSION: MRI HEAD IMPRESSION: 1. Multiple scattered acute ischemic infarcts involving the cortical and subcortical aspects of both cerebral hemispheres and cerebellum. No associated hemorrhage or mass effect. A central thromboembolic etiology is likely given the various vascular distributions involved. 2. Underlying age-related cerebral atrophy with mild chronic small vessel ischemic disease. MRA HEAD IMPRESSION: 1. Negative intracranial MRA for large vessel  occlusion. 2. 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 3. Moderate multifocal stenoses about the left carotid siphon. Electronically Signed   By: Jeannine Boga M.D.   On: 07/09/2022 05:43   CT HEAD WO CONTRAST (5MM)  Result Date: 07/08/2022 CLINICAL DATA:  Neurological deficit EXAM: CT HEAD WITHOUT CONTRAST TECHNIQUE: Contiguous axial images were obtained from the base of the skull through the vertex without intravenous contrast. RADIATION DOSE REDUCTION: This exam was performed according to the departmental dose-optimization program which includes automated exposure control, adjustment of the mA and/or kV according to patient size and/or use of iterative reconstruction technique. COMPARISON:  12/11/2019 FINDINGS: Brain: No acute intracranial findings are seen. There are no signs of bleeding within the cranium. Cortical sulci are prominent. There is decreased density in periventricular white matter. Vascular: Scattered arterial calcifications are seen. Skull: Unremarkable. Sinuses/Orbits: Unremarkable. Other: None. IMPRESSION: No acute intracranial findings are seen noncontrast CT brain. Atrophy. Small-vessel disease. Electronically Signed   By: Elmer Picker M.D.   On: 07/08/2022 17:10    NEURO:  Patient sitting in a recliner eating lunch Mental Status: AA&Ox3 , follows commands , able to recite correct place, time, month, year and home address, Language: speech is normal  Naming, repetition, fluency, and comprehension intact. Cranial Nerves: PERRL3 mm EOMII, visual fields full, no facial asymmetry, facial sensation intact, hearing intact, tongue/uvula/soft palate midline, normal sternocleidomastoid and trapezius muscle strength. No evidence of tongue atrophy or fibrillations Motor: RUE 5/5 RLE 5/5  R  Grip 5/5             LUE 3+/5  LLE  3/5 (poor effort) Tone: is normal and bulk is  normal Sensation- Intact to light touch bilaterally Coordination: FTN intact bilaterally, no ataxia in RUE Gait- deferred  ASSESSMENT/PLAN Mr. KONNOR VONDRASEK is a 76 y.o. with past medical history HTN, HDL,, DM, Obesity, paresthesia, low back pain and lumbar radiculopathy. He was admitted for unstable angina and s/p orbital procedure and stent placement.Patient c/o  LUE/LLE numbness, weakness and including left hand. He was seen as a code stroke with [facility] @ 4 (Left motor leg-2 and Left motor arm -2). He has been on DAPT Therapy /statin therapy post procedure. MRI Brain revealed a embolic pattern which can be due to his current cardiac procedure vs small vessel disease  Stroke workup: MRI /MRA BRAIN:  Anterior circulation: Examination degraded by motion. Visualized distal cervical segments of the internal carotid arteries are patent with antegrade flow. Petrous segments patent bilaterally. Atheromatous change seen throughout the carotid siphons bilaterally. There is associated up to moderate stenoses about the left siphon (series 1042, image 4). No significant stenosis about the contralateral right carotid siphon. A1 segments patent. Normal anterior communicating artery complex. Both anterior cerebral arteries patent without high-grade stenosis. M1 segments patent bilaterally. Right M1 bifurcates early. Severe proximal M2 stenoses noted (series 1042, image 9). Additional diffuse small vessel atheromatous irregularity throughout the MCA branches.   Posterior circulation: Both vertebral arteries patent without stenosis. Left vertebral artery dominant. Both PICA patent. Basilar patent to its distal aspect without stenosis. Superior cerebral arteries patent bilaterally. Both PCAs primarily supplied via the basilar. Atheromatous irregularity throughout both PCAs with associated moderate multifocal P2 stenoses, left worse than right (series 1036, image 2). PCAs remain patent to their distal  aspects.   Anatomic variants: None significant.  No visible aneurysm.   IMPRESSION: MRI HEAD IMPRESSION:   1. Multiple scattered acute ischemic infarcts involving the cortical and subcortical aspects of both cerebral hemispheres and cerebellum. No associated hemorrhage or mass effect. A central thromboembolic etiology is likely given the various vascular distributions involved. 2. Underlying age-related cerebral atrophy with mild chronic small vessel ischemic disease.   MRA HEAD IMPRESSION:   1. Negative intracranial MRA for large vessel occlusion. 2. 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 3. Moderate multifocal stenoses about the left carotid siphon.  Carotid Doppler  Bilat: Summary:  Right Carotid: Velocities in the right ICA are consistent with a 1-39%  stenosis.   Left Carotid: Velocities in the left ICA are consistent with a 1-39%  stenosis.   Vertebrals: Bilateral vertebral arteries demonstrate antegrade flow.  Subclavians: Normal flow hemodynamics were seen in bilateral subclavian               arteries.  2D Echo : IMPRESSIONS Left ventricular ejection fraction, by estimation, is 50 to 55%. The left ventricle has low normal function. The left ventricle has no regional wall motion abnormalities. There is moderate left ventricular hypertrophy. Left ventricular diastolic parameters are consistent with Grade II diastolic dysfunction (pseudonormalization). Elevated left atrial pressure. 1. 2. Right ventricular systolic function is normal. The right ventricular size is normal. 3. Left atrial size was moderately dilated. Moderate pericardial effusion. The pericardial effusion is posterior to the left ventricle. There is no evidence of cardiac tamponade. 4. The mitral valve is normal in structure. No evidence of mitral valve regurgitation. No  evidence of mitral  stenosis. 5. The aortic valve is calcified. Aortic valve regurgitation is not visualized. Moderate aortic valve stenosis. Aortic valve area, by VTI measures 1.49 cm. Aortic valve mean gradient measures 18.7 mmHg. Aortic valve Vmax measures 2.82 m/s.  LDL 83 HgbA1c 7.0 Antiplatelet Therapy: DAPT : ASA 81 mg qday + Plavix 75 mg qday Statin Therapy: High Intensity: Atorvastatin 80 mg qhs + Zetia 10 mg qhs  Therapy recommendations:  PT/OT /SLT for discharge planing Disposition:  Recommended Inpatient Rehab   Hyperlipidemia Home meds:resumed in hospital LDL 83, goal < 70  High intensity statin not indicated and Atorvastatin increased to 80 mg qday Continue statin at discharge  Diabetes type II controled  Home meds:  jardiance ,Glucotrol, Metformin HgbA1c 7.0, goal < 7.0 CBGs Recent Labs    07/08/22 2110 07/09/22 0744 07/09/22 1142  GLUCAP 153* 157* 155*    Other Stroke Risk Factors Advanced Age >/= 73  Cigarette smoker advised to stop smoking  Obesity, Body mass index is 29.73 kg/m., BMI >/= 30 associated with increased stroke risk, recommend weight loss, diet and exercise as appropriate  Coronary artery disease Congestive heart failure  Other Active Problems   Hospital day # 0    To contact Stroke Continuity provider, please refer to [email or web address] After hours, contact General Neurology

## 2022-07-10 ENCOUNTER — Other Ambulatory Visit: Payer: Self-pay

## 2022-07-10 ENCOUNTER — Encounter (HOSPITAL_COMMUNITY): Payer: Self-pay | Admitting: Physical Medicine & Rehabilitation

## 2022-07-10 ENCOUNTER — Inpatient Hospital Stay (HOSPITAL_COMMUNITY)
Admission: RE | Admit: 2022-07-10 | Discharge: 2022-07-22 | DRG: 057 | Disposition: A | Discharge status: Home / Self Care | Payer: Medicare Other | Source: Intra-hospital | Attending: Physical Medicine & Rehabilitation | Admitting: Physical Medicine & Rehabilitation

## 2022-07-10 DIAGNOSIS — E1122 Type 2 diabetes mellitus with diabetic chronic kidney disease: Secondary | ICD-10-CM | POA: Diagnosis present

## 2022-07-10 DIAGNOSIS — M5441 Lumbago with sciatica, right side: Secondary | ICD-10-CM | POA: Diagnosis not present

## 2022-07-10 DIAGNOSIS — I6340 Cerebral infarction due to embolism of unspecified cerebral artery: Secondary | ICD-10-CM

## 2022-07-10 DIAGNOSIS — I69392 Facial weakness following cerebral infarction: Secondary | ICD-10-CM | POA: Diagnosis not present

## 2022-07-10 DIAGNOSIS — G608 Other hereditary and idiopathic neuropathies: Secondary | ICD-10-CM | POA: Diagnosis present

## 2022-07-10 DIAGNOSIS — Z955 Presence of coronary angioplasty implant and graft: Secondary | ICD-10-CM | POA: Diagnosis not present

## 2022-07-10 DIAGNOSIS — I739 Peripheral vascular disease, unspecified: Secondary | ICD-10-CM

## 2022-07-10 DIAGNOSIS — Z8249 Family history of ischemic heart disease and other diseases of the circulatory system: Secondary | ICD-10-CM | POA: Diagnosis not present

## 2022-07-10 DIAGNOSIS — G4700 Insomnia, unspecified: Secondary | ICD-10-CM | POA: Diagnosis not present

## 2022-07-10 DIAGNOSIS — M25552 Pain in left hip: Secondary | ICD-10-CM | POA: Diagnosis not present

## 2022-07-10 DIAGNOSIS — E785 Hyperlipidemia, unspecified: Secondary | ICD-10-CM | POA: Diagnosis present

## 2022-07-10 DIAGNOSIS — M1712 Unilateral primary osteoarthritis, left knee: Secondary | ICD-10-CM | POA: Diagnosis present

## 2022-07-10 DIAGNOSIS — E669 Obesity, unspecified: Secondary | ICD-10-CM | POA: Diagnosis not present

## 2022-07-10 DIAGNOSIS — R1032 Left lower quadrant pain: Secondary | ICD-10-CM | POA: Diagnosis not present

## 2022-07-10 DIAGNOSIS — Z79899 Other long term (current) drug therapy: Secondary | ICD-10-CM | POA: Diagnosis not present

## 2022-07-10 DIAGNOSIS — E1151 Type 2 diabetes mellitus with diabetic peripheral angiopathy without gangrene: Secondary | ICD-10-CM | POA: Diagnosis present

## 2022-07-10 DIAGNOSIS — E1169 Type 2 diabetes mellitus with other specified complication: Secondary | ICD-10-CM

## 2022-07-10 DIAGNOSIS — Z87892 Personal history of anaphylaxis: Secondary | ICD-10-CM | POA: Diagnosis not present

## 2022-07-10 DIAGNOSIS — I639 Cerebral infarction, unspecified: Secondary | ICD-10-CM | POA: Diagnosis present

## 2022-07-10 DIAGNOSIS — M109 Gout, unspecified: Secondary | ICD-10-CM | POA: Diagnosis present

## 2022-07-10 DIAGNOSIS — M25562 Pain in left knee: Secondary | ICD-10-CM | POA: Diagnosis present

## 2022-07-10 DIAGNOSIS — G8929 Other chronic pain: Secondary | ICD-10-CM | POA: Diagnosis present

## 2022-07-10 DIAGNOSIS — Z7984 Long term (current) use of oral hypoglycemic drugs: Secondary | ICD-10-CM

## 2022-07-10 DIAGNOSIS — N183 Chronic kidney disease, stage 3 unspecified: Secondary | ICD-10-CM | POA: Diagnosis present

## 2022-07-10 DIAGNOSIS — F32A Depression, unspecified: Secondary | ICD-10-CM | POA: Diagnosis present

## 2022-07-10 DIAGNOSIS — I25810 Atherosclerosis of coronary artery bypass graft(s) without angina pectoris: Secondary | ICD-10-CM | POA: Diagnosis not present

## 2022-07-10 DIAGNOSIS — F17210 Nicotine dependence, cigarettes, uncomplicated: Secondary | ICD-10-CM | POA: Diagnosis present

## 2022-07-10 DIAGNOSIS — I5022 Chronic systolic (congestive) heart failure: Secondary | ICD-10-CM | POA: Diagnosis present

## 2022-07-10 DIAGNOSIS — M5416 Radiculopathy, lumbar region: Secondary | ICD-10-CM | POA: Diagnosis present

## 2022-07-10 DIAGNOSIS — I2510 Atherosclerotic heart disease of native coronary artery without angina pectoris: Secondary | ICD-10-CM | POA: Diagnosis present

## 2022-07-10 DIAGNOSIS — M5442 Lumbago with sciatica, left side: Secondary | ICD-10-CM

## 2022-07-10 DIAGNOSIS — I69354 Hemiplegia and hemiparesis following cerebral infarction affecting left non-dominant side: Principal | ICD-10-CM

## 2022-07-10 DIAGNOSIS — K219 Gastro-esophageal reflux disease without esophagitis: Secondary | ICD-10-CM | POA: Diagnosis present

## 2022-07-10 DIAGNOSIS — Z96651 Presence of right artificial knee joint: Secondary | ICD-10-CM | POA: Diagnosis present

## 2022-07-10 DIAGNOSIS — I130 Hypertensive heart and chronic kidney disease with heart failure and stage 1 through stage 4 chronic kidney disease, or unspecified chronic kidney disease: Secondary | ICD-10-CM | POA: Diagnosis present

## 2022-07-10 DIAGNOSIS — F419 Anxiety disorder, unspecified: Secondary | ICD-10-CM | POA: Diagnosis present

## 2022-07-10 DIAGNOSIS — I70213 Atherosclerosis of native arteries of extremities with intermittent claudication, bilateral legs: Secondary | ICD-10-CM | POA: Diagnosis present

## 2022-07-10 DIAGNOSIS — Z7982 Long term (current) use of aspirin: Secondary | ICD-10-CM

## 2022-07-10 DIAGNOSIS — N3281 Overactive bladder: Secondary | ICD-10-CM | POA: Diagnosis present

## 2022-07-10 DIAGNOSIS — Z820 Family history of epilepsy and other diseases of the nervous system: Secondary | ICD-10-CM

## 2022-07-10 DIAGNOSIS — Z8719 Personal history of other diseases of the digestive system: Secondary | ICD-10-CM

## 2022-07-10 DIAGNOSIS — M545 Low back pain, unspecified: Secondary | ICD-10-CM | POA: Diagnosis present

## 2022-07-10 LAB — BASIC METABOLIC PANEL
Anion gap: 11 (ref 5–15)
BUN: 25 mg/dL — ABNORMAL HIGH (ref 8–23)
CO2: 21 mmol/L — ABNORMAL LOW (ref 22–32)
Calcium: 8.8 mg/dL — ABNORMAL LOW (ref 8.9–10.3)
Chloride: 106 mmol/L (ref 98–111)
Creatinine, Ser: 1.39 mg/dL — ABNORMAL HIGH (ref 0.61–1.24)
GFR, Estimated: 52 mL/min — ABNORMAL LOW (ref >60)
Glucose, Bld: 142 mg/dL — ABNORMAL HIGH (ref 70–99)
Potassium: 3.9 mmol/L (ref 3.5–5.1)
Sodium: 138 mmol/L (ref 135–145)

## 2022-07-10 LAB — GLUCOSE, CAPILLARY
Glucose-Capillary: 133 mg/dL — ABNORMAL HIGH (ref 70–99)
Glucose-Capillary: 186 mg/dL — ABNORMAL HIGH (ref 70–99)
Glucose-Capillary: 176 mg/dL — ABNORMAL HIGH (ref 70–99)
Glucose-Capillary: 123 mg/dL — ABNORMAL HIGH (ref 70–99)

## 2022-07-10 MED ORDER — MIRABEGRON ER 25 MG PO TB24
25.0000 mg | ORAL_TABLET | Freq: Every day | ORAL | Status: DC
  Administered 2022-07-11 – 2022-07-22 (×12): 25 mg via ORAL
  Filled 2022-07-10 (×12): qty 1

## 2022-07-10 MED ORDER — GUAIFENESIN-DM 100-10 MG/5ML PO SYRP: 5.0000 mL | ORAL_SOLUTION | Freq: Four times a day (QID) | ORAL | Status: DC | PRN

## 2022-07-10 MED ORDER — FLEET ENEMA 7-19 GM/118ML RE ENEM: 1.0000 | ENEMA | Freq: Once | RECTAL | Status: DC | PRN

## 2022-07-10 MED ORDER — OXYCODONE HCL 5 MG PO TABS
5.0000 mg | ORAL_TABLET | Freq: Four times a day (QID) | ORAL | Status: DC | PRN
  Administered 2022-07-12: 5 mg via ORAL
  Filled 2022-07-10: qty 1

## 2022-07-10 MED ORDER — PROCHLORPERAZINE 25 MG RE SUPP: 12.5000 mg | Freq: Four times a day (QID) | RECTAL | Status: DC | PRN

## 2022-07-10 MED ORDER — ACETAMINOPHEN 325 MG PO TABS: 325.0000 mg | ORAL_TABLET | ORAL | Status: DC | PRN

## 2022-07-10 MED ORDER — PROCHLORPERAZINE MALEATE 5 MG PO TABS: 5.0000 mg | ORAL_TABLET | Freq: Four times a day (QID) | ORAL | Status: DC | PRN

## 2022-07-10 MED ORDER — ENOXAPARIN SODIUM 40 MG/0.4ML IJ SOSY
40.0000 mg | PREFILLED_SYRINGE | INTRAMUSCULAR | Status: DC
  Administered 2022-07-10 – 2022-07-21 (×12): 40 mg via SUBCUTANEOUS
  Filled 2022-07-10 (×12): qty 0.4

## 2022-07-10 MED ORDER — MAGNESIUM HYDROXIDE 400 MG/5ML PO SUSP: 30.0000 mL | Freq: Every day | ORAL | Status: DC | PRN

## 2022-07-10 MED ORDER — EMPAGLIFLOZIN 10 MG PO TABS
10.0000 mg | ORAL_TABLET | Freq: Every day | ORAL | Status: DC
  Administered 2022-07-11 – 2022-07-15 (×5): 10 mg via ORAL
  Filled 2022-07-10 (×5): qty 1

## 2022-07-10 MED ORDER — DIPHENHYDRAMINE HCL 12.5 MG/5ML PO ELIX: 12.5000 mg | ORAL_SOLUTION | Freq: Four times a day (QID) | ORAL | Status: DC | PRN

## 2022-07-10 MED ORDER — EZETIMIBE 10 MG PO TABS
10.0000 mg | ORAL_TABLET | Freq: Every day | ORAL | Status: DC
  Administered 2022-07-11 – 2022-07-22 (×12): 10 mg via ORAL
  Filled 2022-07-10 (×12): qty 1

## 2022-07-10 MED ORDER — TRAZODONE HCL 50 MG PO TABS
25.0000 mg | ORAL_TABLET | Freq: Every evening | ORAL | Status: DC | PRN
  Administered 2022-07-12 – 2022-07-15 (×4): 50 mg via ORAL
  Filled 2022-07-10 (×4): qty 1

## 2022-07-10 MED ORDER — ALUM & MAG HYDROXIDE-SIMETH 200-200-20 MG/5ML PO SUSP: 30.0000 mL | ORAL | Status: DC | PRN

## 2022-07-10 MED ORDER — METHADONE HCL 10 MG PO TABS
5.0000 mg | ORAL_TABLET | ORAL | Status: DC
  Administered 2022-07-12 – 2022-07-20 (×5): 5 mg via ORAL
  Filled 2022-07-10 (×6): qty 1

## 2022-07-10 MED ORDER — TAMSULOSIN HCL 0.4 MG PO CAPS
0.4000 mg | ORAL_CAPSULE | Freq: Every day | ORAL | Status: DC
  Administered 2022-07-10 – 2022-07-21 (×11): 0.4 mg via ORAL
  Filled 2022-07-10 (×12): qty 1

## 2022-07-10 MED ORDER — PROCHLORPERAZINE EDISYLATE 10 MG/2ML IJ SOLN: 5.0000 mg | Freq: Four times a day (QID) | INTRAMUSCULAR | Status: DC | PRN

## 2022-07-10 MED ORDER — AMLODIPINE BESYLATE 10 MG PO TABS
10.0000 mg | ORAL_TABLET | Freq: Every morning | ORAL | Status: DC
  Administered 2022-07-11 – 2022-07-22 (×12): 10 mg via ORAL
  Filled 2022-07-10 (×12): qty 1

## 2022-07-10 MED ORDER — ASPIRIN 81 MG PO CHEW
81.0000 mg | CHEWABLE_TABLET | Freq: Every day | ORAL | Status: DC
  Administered 2022-07-11 – 2022-07-22 (×12): 81 mg via ORAL
  Filled 2022-07-10 (×11): qty 1

## 2022-07-10 MED ORDER — DULOXETINE HCL 60 MG PO CPEP
60.0000 mg | ORAL_CAPSULE | Freq: Every evening | ORAL | Status: DC
  Administered 2022-07-11 – 2022-07-21 (×11): 60 mg via ORAL
  Filled 2022-07-10 (×11): qty 1

## 2022-07-10 MED ORDER — SORBITOL 70 % SOLN
30.0000 mL | Freq: Every day | Status: DC | PRN
  Administered 2022-07-17: 30 mL via ORAL
  Filled 2022-07-10: qty 30

## 2022-07-10 MED ORDER — COLCHICINE 0.6 MG PO TABS
0.6000 mg | ORAL_TABLET | Freq: Two times a day (BID) | ORAL | Status: DC
  Administered 2022-07-10 – 2022-07-22 (×24): 0.6 mg via ORAL
  Filled 2022-07-10 (×24): qty 1

## 2022-07-10 MED ORDER — METHOCARBAMOL 500 MG PO TABS
500.0000 mg | ORAL_TABLET | Freq: Four times a day (QID) | ORAL | Status: DC | PRN
  Administered 2022-07-18: 500 mg via ORAL
  Filled 2022-07-10: qty 1

## 2022-07-10 MED ORDER — CLOPIDOGREL BISULFATE 75 MG PO TABS
75.0000 mg | ORAL_TABLET | Freq: Every day | ORAL | Status: DC
  Administered 2022-07-11 – 2022-07-22 (×12): 75 mg via ORAL
  Filled 2022-07-10 (×12): qty 1

## 2022-07-10 MED ORDER — INSULIN ASPART 100 UNIT/ML IJ SOLN
0.0000 [IU] | Freq: Three times a day (TID) | INTRAMUSCULAR | Status: DC
  Administered 2022-07-11: 8 [IU] via SUBCUTANEOUS
  Administered 2022-07-11 (×2): 3 [IU] via SUBCUTANEOUS
  Administered 2022-07-12: 2 [IU] via SUBCUTANEOUS
  Administered 2022-07-12 – 2022-07-13 (×3): 3 [IU] via SUBCUTANEOUS
  Administered 2022-07-13: 2 [IU] via SUBCUTANEOUS
  Administered 2022-07-14: 3 [IU] via SUBCUTANEOUS
  Administered 2022-07-14: 5 [IU] via SUBCUTANEOUS
  Administered 2022-07-14 – 2022-07-15 (×2): 2 [IU] via SUBCUTANEOUS
  Administered 2022-07-15 (×2): 3 [IU] via SUBCUTANEOUS
  Administered 2022-07-16: 5 [IU] via SUBCUTANEOUS
  Administered 2022-07-16: 3 [IU] via SUBCUTANEOUS
  Administered 2022-07-16 – 2022-07-17 (×2): 2 [IU] via SUBCUTANEOUS
  Administered 2022-07-17 – 2022-07-18 (×3): 3 [IU] via SUBCUTANEOUS
  Administered 2022-07-18 – 2022-07-20 (×6): 2 [IU] via SUBCUTANEOUS
  Administered 2022-07-20 – 2022-07-21 (×3): 3 [IU] via SUBCUTANEOUS
  Administered 2022-07-21 – 2022-07-22 (×2): 2 [IU] via SUBCUTANEOUS

## 2022-07-10 MED ORDER — TIZANIDINE HCL 4 MG PO TABS
4.0000 mg | ORAL_TABLET | Freq: Every day | ORAL | Status: DC
  Administered 2022-07-10 – 2022-07-21 (×12): 4 mg via ORAL
  Filled 2022-07-10 (×12): qty 1

## 2022-07-10 MED ORDER — METOPROLOL SUCCINATE ER 25 MG PO TB24
25.0000 mg | ORAL_TABLET | Freq: Every day | ORAL | Status: DC
  Administered 2022-07-11 – 2022-07-22 (×12): 25 mg via ORAL
  Filled 2022-07-10 (×12): qty 1

## 2022-07-10 MED ORDER — ISOSORBIDE MONONITRATE ER 30 MG PO TB24
30.0000 mg | ORAL_TABLET | Freq: Every day | ORAL | Status: DC
  Administered 2022-07-11 – 2022-07-22 (×12): 30 mg via ORAL
  Filled 2022-07-10 (×12): qty 1

## 2022-07-10 MED ORDER — ATORVASTATIN CALCIUM 80 MG PO TABS
80.0000 mg | ORAL_TABLET | Freq: Every day | ORAL | Status: DC
  Administered 2022-07-10 – 2022-07-21 (×12): 80 mg via ORAL
  Filled 2022-07-10 (×12): qty 1

## 2022-07-10 NOTE — Discharge Summary (Signed)
Physician Discharge Summary  Patient ID: Thomas [name] MRN: [id number] DOB/AGE: Jan 06, 1946 76 y.o.  Admit date: 07/07/2022 Discharge date: 07/10/2022  Primary Discharge Diagnosis: Stroke Coronary artery disease Peripheral artery disease  Secondary Discharge Diagnosis: Hypertension Hyperlipidemia   Hospital Course:   76 y/o Caucasian male with HF with mildly reduced EF, CAD with angina in medical therapy, PAD with severe claudication.    Patient underwent coronary angiography, RFR to mid RCA, atherectomy and PCI to prox-mid LAD, abdominal aortogram. Unfortunately, he had small scattered stroke, likely periprocedural. He has mild left sided weakness. He will be discharged and admitted to inpatient rehab.   Will continue medical management for now for PAD. After he recovers, and is walking more, will consider invasive management for PAD if severe claudication persists.    Discharge Exam: Blood pressure (!) 140/66, pulse 71, temperature 98.9 F (37.2 C), temperature source Oral, resp. rate 16, height _0  (1.803 m), weight 96.7 kg, SpO2 98 %.   Physical Exam Vitals and nursing note reviewed.  Constitutional:      General: He is not in acute distress. Neck:     Vascular: No JVD.  Cardiovascular:     Rate and Rhythm: Normal rate and regular rhythm.     Pulses: Decreased pulses.     Heart sounds: Normal heart sounds. No murmur heard. Pulmonary:     Effort: Pulmonary effort is normal.     Breath sounds: Normal breath sounds. No wheezing or rales.  Musculoskeletal:     Right lower leg: No edema.     Left lower leg: No edema.    Recommendations on discharge:     Significant Diagnostic Studies:  EKG 07/08/2022: Normal sinus rhythm Left anterior fascicular block Moderate voltage criteria for LVH, may be normal variant ( R in aVL , Cornell product ) T wave abnormality, consider lateral ischemia Prolonged QT Abnormal ECG  Carotid US 07/09/2022: Right Carotid:  Velocities in the right ICA are consistent with a 1-39%  stenosis.  Left Carotid: Velocities in the left ICA are consistent with a 1-39%  stenosis.  Vertebrals: Bilateral vertebral arteries demonstrate antegrade flow.  Subclavians: Normal flow hemodynamics were seen in bilateral subclavian               arteries.   Echocardiogram 07/09/2022: 1. Left ventricular ejection fraction, by estimation, is 50 to 55%. The  left ventricle has low normal function. The left ventricle has no regional  wall motion abnormalities. There is moderate left ventricular hypertrophy.  Left ventricular diastolic  parameters are consistent with Grade II diastolic dysfunction  (pseudonormalization). Elevated left atrial pressure.   2. Right ventricular systolic function is normal. The right ventricular  size is normal.   3. Left atrial size was moderately dilated.   4. Moderate pericardial effusion. The pericardial effusion is posterior  to the left ventricle. There is no evidence of cardiac tamponade.   5. The mitral valve is normal in structure. No evidence of mitral valve  regurgitation. No evidence of mitral stenosis.   6. The aortic valve is calcified. Aortic valve regurgitation is not  visualized. Moderate aortic valve stenosis. Aortic valve area, by VTI  measures 1.49 cm. Aortic valve mean gradient measures 18.7 mmHg. Aortic  valve Vmax measures 2.82 m/s.   Comparison(s): Previous study on 02/26/2022 reported LVEF 45-50%, grade 1  diastolic dysfunction.   Coronary intervention 07/07/2022: LM: Normal LAD: Prox 40%, followed by prox-mid 80% calcific stenosis  Successful percutaneous coronary intervention prox-mid LAD         Orbital atherectomy, PTCA and overlapping stents placement mid to prox LAD         3.5 X 24 mm and 3.5 X 12 mm Synergy drug-eluting stents         Post dilatation with 3.5X18 mm Victoria balloon up to 22 atm Lcx: OM with moderate disease RCA: Prox 50% Mid calcific 75% stenosis.  RFR 0.96   No significant disease in abdominal aorta, renal arteries, bilateral ileofemoral arteries. Severe tortuosity noted.   Will stage LE runoff and itnervention    FOLLOW UP PLANS AND APPOINTMENTS Discharge Instructions     Amb Referral to Cardiac Rehabilitation   Complete by: As directed    [place]   Diagnosis: Coronary Stents   After initial evaluation and assessments completed: Virtual Based Care may be provided alone or in conjunction with Phase 2 Cardiac Rehab based on patient barriers.: Yes   Intensive Cardiac Rehabilitation (ICR) [place] location only OR Traditional Cardiac Rehabilitation (TCR) *If criteria for ICR are not met will enroll in TCR [facility] only): Yes   Ambulatory referral to Neurology   Complete by: As directed    Follow up with stroke clinic NP (Jessica Vanschaick or Cecille Rubin, if both not available, consider Zachery Dauer, or Ahern) at [facility] in about 4 weeks. Thanks.   Diet - low sodium heart healthy   Complete by: As directed    Diet - low sodium heart healthy   Complete by: As directed    Increase activity slowly   Complete by: As directed    Increase activity slowly   Complete by: As directed       Allergies as of 07/10/2022       Reactions   Wasp Venom Anaphylaxis, Itching, Swelling        Medication List     STOP taking these medications    ibuprofen 200 MG tablet Commonly known as: ADVIL   omeprazole 40 MG capsule Commonly known as: PRILOSEC       TAKE these medications    amLODipine 10 MG tablet Commonly known as: NORVASC Take 10 mg by mouth in the morning. Heart Medication   aspirin 81 MG chewable tablet Chew 81 mg by mouth daily at 12 noon. Heart medication   atorvastatin 80 MG tablet Commonly known as: LIPITOR Take 40 mg by mouth at bedtime. Heart medication   Calcium Carbonate-Vitamin D 600-400 MG-UNIT tablet Take 1 tablet by mouth 2 (two) times daily.   cetirizine 10 MG tablet Commonly known as: ZYRTEC Take  10 mg by mouth daily as needed for allergies.   clopidogrel 75 MG tablet Commonly known as: PLAVIX Take 1 tablet (75 mg total) by mouth daily with breakfast.   colchicine 0.6 MG tablet Take 1 tablet (0.6 mg total) by mouth 2 (two) times daily.   diclofenac Sodium 1 % Gel Commonly known as: VOLTAREN Apply 1 application topically 4 (four) times daily as needed (pain).   DULoxetine 60 MG capsule Commonly known as: CYMBALTA Take 60 mg by mouth every evening.   empagliflozin 10 MG Tabs tablet Commonly known as: JARDIANCE Take 1 tablet (10 mg total) by mouth daily before breakfast.   Entresto 97-103 MG Generic drug: sacubitril-valsartan Take 1 tablet by mouth 2 (two) times daily.   EPINEPHrine 0.3 mg/0.3 mL Soaj injection Commonly known as: EPI-PEN Inject 0.3 mg into the muscle daily as needed (for anaphylaxis).   ezetimibe 10  MG tablet Commonly known as: ZETIA Take 1 tablet (10 mg total) by mouth daily.   furosemide 40 MG tablet Commonly known as: LASIX Take 1 tablet (40 mg total) by mouth daily.   glipiZIDE 10 MG tablet Commonly known as: GLUCOTROL Take 10 mg by mouth in the morning and at bedtime.   isosorbide mononitrate 30 MG 24 hr tablet Commonly known as: IMDUR Take 1 tablet (30 mg total) by mouth daily.   metFORMIN 1000 MG tablet Commonly known as: GLUCOPHAGE Take 1,000 mg by mouth 2 (two) times daily.   methadone 5 MG tablet Commonly known as: DOLOPHINE Take 5 mg by mouth every other day. For severe chronic pain   metoprolol succinate 25 MG 24 hr tablet Commonly known as: Toprol XL Take 1 tablet (25 mg total) by mouth daily.   nitroGLYCERIN 0.4 MG SL tablet Commonly known as: NITROSTAT Place 1 tablet (0.4 mg total) under the tongue every 5 (five) minutes as needed for chest pain.   oxyCODONE 5 MG immediate release tablet Commonly known as: Oxy IR/ROXICODONE Take 5 mg by mouth 4 (four) times daily as needed (severe chronic pain).   tamsulosin 0.4 MG  Caps capsule Commonly known as: FLOMAX Take 0.4 mg by mouth at bedtime.   tiZANidine 4 MG tablet Commonly known as: ZANAFLEX Take 4 mg by mouth at bedtime.   Varenicline Tartrate (Starter) 0.5 MG X 11 & 1 MG X 42 Tbpk Commonly known as: Chantix Starting Month Pak Take 1 tablet by mouth daily.   Vibegron 75 MG Tabs Take 75 mg by mouth daily.        Follow-up Information     [facility]. Schedule an appointment as soon as possible for a visit in 1 month(s).   Specialty: Neurology Why: stroke clinic Contact information: [address] [place] [phone]                  Nigel Mormon, MD Pager: [phone] Office: [phone]

## 2022-07-10 NOTE — H&P (Signed)
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  On 07/01/2022 by Dr. Marina Goodell and arrangements made for left heart cath with intervention.  He presented to the Cath Lab on 10/31 and underwent PCI with drug-eluting stents to the LAD.  Recommendations are for Plavix and aspirin daily for minimum of 6 months.  He was admitted for observation.  On 11/1, he complained of left hand numbness and weakness.  He reported left hand numbness for approximately 3 days.  Rapid response team was notified and stat CT of the head was performed.  RI of the brain showed bilateral embolic shower, right greater than left.  MRA of the head with bilateral M2 and P2 stenoses, left sci-fi and atherosclerosis.  Carotid Doppler unremarkable.  On exam, he had mild left facial droop left upper extremity and left proximal lower extremity weakness.  Neurology for infarct most likely related to cardiac procedure although other cardioembolic sources cannot be completely ruled out.  Echo with ejection fraction 50 to 55%.  Recommend 30-day cardiac event monitoring as outpatient.  He is tolerating 2 g sodium diet.The patient requires inpatient physical medicine and rehabilitation evaluations and treatment secondary to dysfunction due to acute ischemic infarcts.   The patient's past medical history is significant for chronic low back pain with radiculopathy, peripheral neuropathy as well as peripheral arterial disease with claudication.  He has an ongoing tobacco history of 27-1/2 pack years.  He has a history of diabetes mellitus type 2 and home regimen includes Jardiance, Glucotrol.  His hemoglobin A1c was 7.0% on 04/28/2022.  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   Review of  Systems  Constitutional:  Negative for chills and fever.  HENT:  Positive for hearing loss. Negative for sore throat.   Respiratory:  Positive for cough. Negative for sputum production.   Cardiovascular:  Negative for chest pain and palpitations.  Gastrointestinal:  Negative for vomiting.  Genitourinary:  Negative for dysuria and urgency.  Musculoskeletal:        Complain of left anterior lower rib tenderness  Neurological:  Negative for dizziness and headaches.  Psychiatric/Behavioral:  Negative for depression. The patient does not have insomnia.         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   Obesity 05/12/2017   Osteoarthritis of knee 05/12/2017   Paresthesia 05/12/2017   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   Prostate nodule 05/12/2017   Right rotator cuff tear 10/01/2021   Seborrheic dermatitis 05/12/2017         Past Surgical History:  Procedure Laterality Date   ABDOMINAL AORTOGRAM W/LOWER EXTREMITY N/A 07/07/2022    Procedure: ABDOMINAL AORTOGRAM W/LOWER EXTREMITY;  Surgeon: Nigel Mormon, MD;  Location: [facility];  Service: Cardiovascular;  Laterality: N/A;   CORONARY ATHERECTOMY N/A 07/07/2022  Procedure: CORONARY ATHERECTOMY;  Surgeon: Nigel Mormon, MD;  Location: [facility];  Service: Cardiovascular;  Laterality: N/A;   CORONARY STENT INTERVENTION N/A 07/07/2022    Procedure: CORONARY STENT INTERVENTION;  Surgeon: Nigel Mormon, MD;  Location: [facility];  Service: Cardiovascular;  Laterality: N/A;   HAND SURGERY Right     INTRAVASCULAR  PRESSURE WIRE/FFR STUDY N/A 07/07/2022    Procedure: INTRAVASCULAR PRESSURE WIRE/FFR STUDY;  Surgeon: Nigel Mormon, MD;  Location: [facility];  Service: Cardiovascular;  Laterality: N/A;   LEFT HEART CATH AND CORONARY ANGIOGRAPHY N/A 07/07/2022    Procedure: LEFT HEART CATH AND CORONARY ANGIOGRAPHY;  Surgeon: Nigel Mormon, MD;  Location: [facility];  Service: Cardiovascular;  Laterality: N/A;   ORIF ANKLE FRACTURE Left 08/15/2007    Distal fibular   REPLACEMENT TOTAL KNEE Right 07/12/2012   RIGHT/LEFT HEART CATH AND CORONARY ANGIOGRAPHY N/A 01/12/2018    Procedure: RIGHT/LEFT HEART CATH AND CORONARY ANGIOGRAPHY;  Surgeon: Belva Crome, MD;  Location: [facility];  Service: Cardiovascular;  Laterality: N/A;   RIGHT/LEFT HEART CATH AND CORONARY ANGIOGRAPHY N/A 05/19/2022    Procedure: RIGHT/LEFT HEART CATH AND CORONARY ANGIOGRAPHY;  Surgeon: Nigel Mormon, MD;  Location: [facility];  Service: Cardiovascular;  Laterality: N/A;   SHOULDER SURGERY Right     UMBILICAL HERNIA REPAIR   06/23/2013         Family History  Problem Relation Age of Onset   Hypertension Mother     Hypertension Father     Alzheimer's disease Father     Heart disease Sister     Heart disease Paternal Aunt      Social History:  reports that he has been smoking cigarettes. He has a 27.50 pack-year smoking history. He has been exposed to tobacco smoke. He has never used smokeless tobacco. He reports that he does not drink alcohol and does not use drugs. Allergies:      Allergies  Allergen Reactions   Wasp Venom Anaphylaxis, Itching and Swelling          Medications Prior to Admission  Medication Sig Dispense Refill   amLODipine (NORVASC) 10 MG tablet Take 10 mg by mouth in the morning. Heart Medication       aspirin 81 MG chewable tablet Chew 81 mg by mouth daily at 12 noon. Heart medication       atorvastatin (LIPITOR) 80 MG tablet Take 40 mg by mouth at  bedtime. Heart medication       Calcium Carbonate-Vitamin D 600-400 MG-UNIT tablet Take 1 tablet by mouth 2 (two) times daily.       diclofenac Sodium (VOLTAREN) 1 % GEL Apply 1 application topically 4 (four) times daily as needed (pain).       DULoxetine (CYMBALTA) 60 MG capsule Take 60 mg by mouth every evening.        empagliflozin (JARDIANCE) 10 MG TABS tablet Take 1 tablet (10 mg total) by mouth daily before breakfast. 30 tablet 11   EPINEPHrine 0.3 mg/0.3 mL IJ SOAJ injection Inject 0.3 mg into the muscle daily as needed (for anaphylaxis).        glipiZIDE (GLUCOTROL) 10 MG tablet Take 10 mg by mouth in the morning and at bedtime.       ibuprofen (ADVIL) 200 MG tablet Take 600 mg by mouth every 8 (eight) hours as needed for moderate pain.       isosorbide mononitrate (  IMDUR) 30 MG 24 hr tablet Take 1 tablet (30 mg total) by mouth daily. 30 tablet 2   metFORMIN (GLUCOPHAGE) 1000 MG tablet Take 1,000 mg by mouth 2 (two) times daily.       methadone (DOLOPHINE) 5 MG tablet Take 5 mg by mouth every other day. For severe chronic pain       metoprolol succinate (TOPROL XL) 25 MG 24 hr tablet Take 1 tablet (25 mg total) by mouth daily. 30 tablet 3   oxyCODONE (OXY IR/ROXICODONE) 5 MG immediate release tablet Take 5 mg by mouth 4 (four) times daily as needed (severe chronic pain).        sacubitril-valsartan (ENTRESTO) 97-103 MG Take 1 tablet by mouth 2 (two) times daily. 180 tablet 3   tamsulosin (FLOMAX) 0.4 MG CAPS capsule Take 0.4 mg by mouth at bedtime.       tiZANidine (ZANAFLEX) 4 MG tablet Take 4 mg by mouth at bedtime.       cetirizine (ZYRTEC) 10 MG tablet Take 10 mg by mouth daily as needed for allergies.       colchicine 0.6 MG tablet Take 1 tablet (0.6 mg total) by mouth 2 (two) times daily. 60 tablet 2   furosemide (LASIX) 40 MG tablet Take 1 tablet (40 mg total) by mouth daily. 90 tablet 3   omeprazole (PRILOSEC) 40 MG capsule Take 40 mg by mouth daily before breakfast.        Varenicline Tartrate, Starter, (CHANTIX STARTING MONTH PAK) 0.5 MG X 11 & 1 MG X 42 TBPK Take 1 tablet by mouth daily. 30 each 3   Vibegron 75 MG TABS Take 75 mg by mouth daily.              Home: Home Living Family/patient expects to be discharged to:: Private residence Living Arrangements: Spouse/significant other Available Help at Discharge: Family, Available 24 hours/day Type of Home: House Home Access: Level entry, [place]: One level Bathroom Shower/Tub: Chiropodist: Handicapped height Bathroom Accessibility: Yes Home Equipment: Conservation officer, nature (2 wheels), [employer] - single point, BSC/3in1, Tub bench, Grab bars - toilet, Grab bars - tub/shower, Wheelchair - manual, Transport planner  Lives With: Spouse   Functional History: Prior Function Prior Level of Function : Independent/Modified Independent Mobility Comments: SPC for mobility; able to ambulate in community ADLs Comments: mod I   Functional Status:  Mobility: Bed Mobility Overal bed mobility: Needs Assistance Bed Mobility: Supine to Sit Supine to sit: Mod assist Sit to supine: Min assist General bed mobility comments: assist with LLE and trunk, cues for sequencing, increased time Transfers Overall transfer level: Needs assistance Equipment used: Rolling walker (2 wheels) Transfers: Bed to chair/wheelchair/BSC Sit to Stand: +2 physical assistance, +2 safety/equipment, Min assist Bed to/from chair/wheelchair/BSC transfer type:: Lateral/scoot transfer  Lateral/Scoot Transfers: Mod assist General transfer comment: lateral scoot transfer bed to recliner (with swing away armrest). Transferred toward R. Cues for sequencing. Increased time   ADL: ADL Overall ADL's : Needs assistance/impaired Eating/Feeding: Set up, Sitting Grooming: Set up, Sitting, Minimal assistance Grooming Details (indicate cue type and reason): min A for sitting balance Upper Body Bathing: Minimal assistance,  Sitting Lower Body Bathing: Maximal assistance, Sit to/from stand Upper Body Dressing : Minimal assistance, Sitting Lower Body Dressing: Maximal assistance, Sit to/from stand Toilet Transfer: Moderate assistance, +2 for physical assistance, +2 for safety/equipment, Ambulation Toilet Transfer Details (indicate cue type and reason): L knee buckle Toileting- Clothing Manipulation and Hygiene: Maximal assistance, Sit to/from  stand Functional mobility during ADLs: Moderate assistance, +2 for physical assistance, +2 for safety/equipment General ADL Comments: limited by poor LUE sensory motor, L knee buckling, impaired balance with posterior and L lateral lean and decreased activity tolerance   Cognition: Cognition Overall Cognitive Status: No family/caregiver present to determine baseline cognitive functioning Arousal/Alertness: Awake/alert Orientation Level: Oriented X4 Attention: Sustained, Focused Focused Attention: Appears intact Sustained Attention: Impaired Sustained Attention Impairment: Verbal basic, Verbal complex Memory: Impaired Memory Impairment: Decreased recall of new information, Decreased short term memory Decreased Short Term Memory: Verbal basic Awareness: Impaired Awareness Impairment: Emergent impairment Problem Solving: Impaired Problem Solving Impairment: Verbal complex, Functional complex Cognition Arousal/Alertness: Awake/alert Behavior During Therapy: Agitated Overall Cognitive Status: No family/caregiver present to determine baseline cognitive functioning Area of Impairment: Safety/judgement, Awareness, Problem solving, Following commands Following Commands: Follows one step commands with increased time Safety/Judgement: Decreased awareness of safety, Decreased awareness of deficits Awareness: Emergent Problem Solving: Slow processing, Decreased initiation, Difficulty sequencing General Comments: Pt benefits from simple cues and increased time for processing.  Quick to anger if overstimulated or staff is moving too fast.   Physical Exam: Blood pressure (!) 140/66, pulse 71, temperature 98.9 F (37.2 C), temperature source Oral, resp. rate 16, height _0  (1.803 m), weight 96.7 kg, SpO2 98 %. Physical Exam Constitutional:      General: He is not in acute distress.    Appearance: He is obese.  HENT:     Head: Normocephalic and atraumatic.     Right Ear: External ear normal.     Left Ear: External ear normal.     Nose: Nose normal.     Mouth/Throat:     Mouth: Mucous membranes are moist.  Eyes:     Extraocular Movements: Extraocular movements intact.     Pupils: Pupils are equal, round, and reactive to light.  Cardiovascular:     Rate and Rhythm: Normal rate and regular rhythm.     Heart sounds: No murmur heard.    No gallop.  Pulmonary:     Effort: Pulmonary effort is normal. No respiratory distress.     Breath sounds: No wheezing.     Comments: Few crackles bilateral bases; good air movement Abdominal:     General: Bowel sounds are normal. There is no distension.     Palpations: Abdomen is soft.     Tenderness: There is no abdominal tenderness.  Musculoskeletal:     Cervical back: Normal range of motion.     Right lower leg: No edema.     Left lower leg: No edema.  Skin:    General: Skin is warm and dry.  Neurological:     General: No focal deficit present.     Mental Status: He is alert and oriented to person, place, and time.     Comments: Reasonable insight and awareness. Functional memory. Speech sl dysarthric. Normal language. RUE 5/5. LUE 3+ to 4-/5 prox to distal. LLE 2/5 HF, 3 to 3+/5 KE, ADF, APF. No focal sensory findings appreciated. No abnl tone. No cerebellar findings appreciated.  Psychiatric:        Mood and Affect: Mood normal.        Behavior: Behavior normal.        Lab Results Last 48 Hours        Results for orders placed or performed during the hospital encounter of 07/07/22 (from the past 48 hour(s))   Glucose, capillary     Status: Abnormal    Collection Time:  07/08/22 11:42 AM  Result Value Ref Range    Glucose-Capillary 184 (H) 70 - 99 mg/dL      Comment: Glucose reference range applies only to samples taken after fasting for at least 8 hours.  Glucose, capillary     Status: Abnormal    Collection Time: 07/08/22  4:13 PM  Result Value Ref Range    Glucose-Capillary 145 (H) 70 - 99 mg/dL      Comment: Glucose reference range applies only to samples taken after fasting for at least 8 hours.  Glucose, capillary     Status: Abnormal    Collection Time: 07/08/22  9:10 PM  Result Value Ref Range    Glucose-Capillary 153 (H) 70 - 99 mg/dL      Comment: Glucose reference range applies only to samples taken after fasting for at least 8 hours.  Lipid panel     Status: Abnormal    Collection Time: 07/09/22  2:30 AM  Result Value Ref Range    Cholesterol 129 0 - 200 mg/dL    Triglycerides 89 <150 mg/dL    HDL 28 (L) >40 mg/dL    Total CHOL/HDL Ratio 4.6 RATIO    VLDL 18 0 - 40 mg/dL    LDL Cholesterol 83 0 - 99 mg/dL      Comment:        Total Cholesterol/HDL:CHD Risk Coronary Heart Disease Risk Table                     Men   Women  1/2 Average Risk   3.4   3.3  Average Risk       5.0   4.4  2 X Average Risk   9.6   7.1  3 X Average Risk  23.4   11.0        Use the calculated Patient Ratio above and the CHD Risk Table to determine the patient's CHD Risk.        ATP III CLASSIFICATION (LDL):  <100     mg/dL   Optimal  100-129  mg/dL   Near or Above                    Optimal  130-159  mg/dL   Borderline  160-189  mg/dL   High  >190     mg/dL   Very High Performed at [facility] [address], [place], [place] [address]    CBC     Status: Abnormal    Collection Time: 07/09/22  2:30 AM  Result Value Ref Range    WBC 8.0 4.0 - 10.5 K/uL    RBC 3.93 (L) 4.22 - 5.81 MIL/uL    Hemoglobin 12.4 (L) 13.0 - 17.0 g/dL    HCT 36.5 (L) 39.0 - 52.0 %    MCV 92.9 80.0 -  100.0 fL    MCH 31.6 26.0 - 34.0 pg    MCHC 34.0 30.0 - 36.0 g/dL    RDW 12.3 11.5 - 15.5 %    Platelets 223 150 - 400 K/uL    nRBC 0.0 0.0 - 0.2 %      Comment: Performed at [facility], [place] [address] [place], [place] [address]  Glucose, capillary     Status: Abnormal    Collection Time: 07/09/22  7:44 AM  Result Value Ref Range    Glucose-Capillary 157 (H) 70 - 99 mg/dL      Comment: Glucose reference  range applies only to samples taken after fasting for at least 8 hours.  Glucose, capillary     Status: Abnormal    Collection Time: 07/09/22 11:42 AM  Result Value Ref Range    Glucose-Capillary 155 (H) 70 - 99 mg/dL      Comment: Glucose reference range applies only to samples taken after fasting for at least 8 hours.  Glucose, capillary     Status: Abnormal    Collection Time: 07/09/22  4:52 PM  Result Value Ref Range    Glucose-Capillary 135 (H) 70 - 99 mg/dL      Comment: Glucose reference range applies only to samples taken after fasting for at least 8 hours.  Glucose, capillary     Status: Abnormal    Collection Time: 07/09/22  9:55 PM  Result Value Ref Range    Glucose-Capillary 114 (H) 70 - 99 mg/dL      Comment: Glucose reference range applies only to samples taken after fasting for at least 8 hours.  Glucose, capillary     Status: Abnormal    Collection Time: 07/10/22  8:27 AM  Result Value Ref Range    Glucose-Capillary 133 (H) 70 - 99 mg/dL      Comment: Glucose reference range applies only to samples taken after fasting for at least 8 hours.  Basic metabolic panel     Status: Abnormal    Collection Time: 07/10/22  8:33 AM  Result Value Ref Range    Sodium 138 135 - 145 mmol/L    Potassium 3.9 3.5 - 5.1 mmol/L    Chloride 106 98 - 111 mmol/L    CO2 21 (L) 22 - 32 mmol/L    Glucose, Bld 142 (H) 70 - 99 mg/dL      Comment: Glucose reference range applies only to samples taken after fasting for at least 8 hours.    BUN 25 (H) 8 - 23 mg/dL    Creatinine, Ser  1.39 (H) 0.61 - 1.24 mg/dL    Calcium 8.8 (L) 8.9 - 10.3 mg/dL    GFR, Estimated 52 (L) >60 mL/min      Comment: (NOTE) Calculated using the CKD-EPI Creatinine Equation (2021)      Anion gap 11 5 - 15      Comment: Performed at [facility] [address], [place], [place] [address]       Imaging Results (Last 48 hours)  ECHOCARDIOGRAM COMPLETE   Result Date: 07/09/2022    ECHOCARDIOGRAM REPORT   Patient Name:   GAUTAM LANGHORST Date of Exam: 07/09/2022 Medical Rec #:  [id number]     Height:       71.0 in Accession #:    [id number]    Weight:       213.2 lb Date of Birth:  Oct 31, 1945     BSA:          2.167 m Patient Age:    27 years      BP:           145/73 mmHg Patient Gender: M             HR:           71 bpm. Exam Location:  Inpatient Procedure: 2D Echo, Cardiac Doppler, Color Doppler and Intracardiac            Opacification Agent Indications:     Stroke I63.9  History:         Patient has prior history  of Echocardiogram examinations, most                  recent 02/26/2022. CAD, PAD; Risk Factors:Hypertension. PCA to                  the LAD on 07/07/22.  Sonographer:     Darlina Sicilian RDCS Referring Phys:  [id number] ASHISH ARORA Diagnosing Phys: Vernell Leep MD IMPRESSIONS  1. Left ventricular ejection fraction, by estimation, is 50 to 55%. The left ventricle has low normal function. The left ventricle has no regional wall motion abnormalities. There is moderate left ventricular hypertrophy. Left ventricular diastolic parameters are consistent with Grade II diastolic dysfunction (pseudonormalization). Elevated left atrial pressure.  2. Right ventricular systolic function is normal. The right ventricular size is normal.  3. Left atrial size was moderately dilated.  4. Moderate pericardial effusion. The pericardial effusion is posterior to the left ventricle. There is no evidence of cardiac tamponade.  5. The mitral valve is normal in structure. No evidence of mitral valve regurgitation. No  evidence of mitral stenosis.  6. The aortic valve is calcified. Aortic valve regurgitation is not visualized. Moderate aortic valve stenosis. Aortic valve area, by VTI measures 1.49 cm. Aortic valve mean gradient measures 18.7 mmHg. Aortic valve Vmax measures 2.82 m/s. Comparison(s): Previous study on 02/26/2022 reported LVEF 38-18%, grade 1 diastolic dysfunction. FINDINGS  Left Ventricle: Left ventricular ejection fraction, by estimation, is 50 to 55%. The left ventricle has low normal function. The left ventricle has no regional wall motion abnormalities. Definity contrast agent was given IV to delineate the left ventricular endocardial borders. The left ventricular internal cavity size was normal in size. There is moderate left ventricular hypertrophy. Left ventricular diastolic parameters are consistent with Grade II diastolic dysfunction (pseudonormalization).  Elevated left atrial pressure. Right Ventricle: The right ventricular size is normal. No increase in right ventricular wall thickness. Right ventricular systolic function is normal. Left Atrium: Left atrial size was moderately dilated. Right Atrium: Right atrial size was normal in size. Pericardium: A moderately sized pericardial effusion is present. The pericardial effusion is posterior to the left ventricle. There is no evidence of cardiac tamponade. Mitral Valve: The mitral valve is normal in structure. No evidence of mitral valve regurgitation. No evidence of mitral valve stenosis. MV peak gradient, 4.4 mmHg. The mean mitral valve gradient is 2.0 mmHg. Tricuspid Valve: The tricuspid valve is normal in structure. Tricuspid valve regurgitation is not demonstrated. No evidence of tricuspid stenosis. Aortic Valve: The aortic valve is calcified. Aortic valve regurgitation is not visualized. Moderate aortic stenosis is present. Aortic valve mean gradient measures 18.7 mmHg. Aortic valve peak gradient measures 31.9 mmHg. Aortic valve area, by VTI measures  1.49 cm. Pulmonic Valve: The pulmonic valve was normal in structure. Pulmonic valve regurgitation is not visualized. No evidence of pulmonic stenosis. Aorta: The aortic root is normal in size and structure. IAS/Shunts: The interatrial septum was not assessed.  LEFT VENTRICLE PLAX 2D LVIDd:         5.60 cm      Diastology LVIDs:         4.00 cm      LV e' medial:    3.45 cm/s LV PW:         1.30 cm      LV E/e' medial:  31.6 LV IVS:        1.50 cm      LV e' lateral:   4.73 cm/s LVOT diam:  2.30 cm      LV E/e' lateral: 23.0 LV SV:         85 LV SV Index:   39 LVOT Area:     4.15 cm  LV Volumes (MOD) LV vol d, MOD A2C: 146.0 ml LV vol d, MOD A4C: 183.0 ml LV vol s, MOD A2C: 72.8 ml LV vol s, MOD A4C: 80.4 ml LV SV MOD A2C:     73.2 ml LV SV MOD A4C:     183.0 ml LV SV MOD BP:      86.3 ml RIGHT VENTRICLE TAPSE (M-mode): 1.6 cm LEFT ATRIUM             Index        RIGHT ATRIUM           Index LA diam:        4.70 cm 2.17 cm/m   RA Area:     18.60 cm LA Vol (A2C):   88.3 ml 40.76 ml/m  RA Volume:   45.30 ml  20.91 ml/m LA Vol (A4C):   97.2 ml 44.86 ml/m LA Biplane Vol: 92.7 ml 42.79 ml/m  AORTIC VALVE AV Area (Vmax):    1.43 cm AV Area (Vmean):   1.34 cm AV Area (VTI):     1.49 cm AV Vmax:           282.33 cm/s AV Vmean:          199.333 cm/s AV VTI:            0.571 m AV Peak Grad:      31.9 mmHg AV Mean Grad:      18.7 mmHg LVOT Vmax:         97.30 cm/s LVOT Vmean:        64.300 cm/s LVOT VTI:          0.205 m LVOT/AV VTI ratio: 0.36  AORTA Ao Root diam: 3.30 cm Ao Asc diam:  3.30 cm MITRAL VALVE MV Area (PHT): 4.39 cm     SHUNTS MV Area VTI:   2.39 cm     Systemic VTI:  0.20 m MV Peak grad:  4.4 mmHg     Systemic Diam: 2.30 cm MV Mean grad:  2.0 mmHg MV Vmax:       1.05 m/s MV Vmean:      68.3 cm/s MV Decel Time: 173 msec MV E velocity: 109.00 cm/s MV A velocity: 94.70 cm/s MV E/A ratio:  1.15 Manish Patwardhan MD Electronically signed by Vernell Leep MD Signature Date/Time: 07/09/2022/12:42:15  PM    Final     VAS US CAROTID   Result Date: 07/09/2022 Carotid Arterial Duplex Study Patient Name:  RENDER MARLEY  Date of Exam:   07/09/2022 Medical Rec #: [id number]      Accession #:    [id number] Date of Birth: Apr 23, 1946      Patient Gender: M Patient Age:   89 years Exam Location:  [facility] Procedure:      VAS US CAROTID Referring Phys: Amie Portland --------------------------------------------------------------------------------  Indications:       CVA. Risk Factors:      Hypertension, hyperlipidemia, Diabetes, current smoker,                    coronary artery disease, PAD. Limitations        Today's exam was limited due to patient movement.  Interference due to saline flush during exam. Comparison Study:  No prior studies. Performing Technologist: Darlin Coco RDMS, RVT  Examination Guidelines: A complete evaluation includes B-mode imaging, spectral Doppler, color Doppler, and power Doppler as needed of all accessible portions of each vessel. Bilateral testing is considered an integral part of a complete examination. Limited examinations for reoccurring indications may be performed as noted.  Right Carotid Findings: +----------+--------+--------+--------+------------------+--------+           PSV cm/sEDV cm/sStenosisPlaque DescriptionComments +----------+--------+--------+--------+------------------+--------+ CCA Prox  98      9                                          +----------+--------+--------+--------+------------------+--------+ CCA Distal67      12                                         +----------+--------+--------+--------+------------------+--------+ ICA Prox  49      11      1-39%   heterogenous               +----------+--------+--------+--------+------------------+--------+ ICA Distal59      14                                         +----------+--------+--------+--------+------------------+--------+ ECA       99                                                  +----------+--------+--------+--------+------------------+--------+ +----------+--------+-------+----------------+-------------------+           PSV cm/sEDV cmsDescribe        Arm Pressure (mmHG) +----------+--------+-------+----------------+-------------------+ Subclavian132            Multiphasic, WNL                    +----------+--------+-------+----------------+-------------------+ +---------+--------+--+--------+-+---------+ VertebralPSV cm/s65EDV cm/s7Antegrade +---------+--------+--+--------+-+---------+  Left Carotid Findings: +----------+--------+--------+--------+------------------+------------------+           PSV cm/sEDV cm/sStenosisPlaque DescriptionComments           +----------+--------+--------+--------+------------------+------------------+ CCA Prox  111     8                                                    +----------+--------+--------+--------+------------------+------------------+ CCA Distal93      13                                intimal thickening +----------+--------+--------+--------+------------------+------------------+ ICA Prox  74      13      1-39%   heterogenous                         +----------+--------+--------+--------+------------------+------------------+ ICA Distal62      15                                                   +----------+--------+--------+--------+------------------+------------------+  ECA       182                                                          +----------+--------+--------+--------+------------------+------------------+ +----------+--------+--------+----------------+-------------------+           PSV cm/sEDV cm/sDescribe        Arm Pressure (mmHG) +----------+--------+--------+----------------+-------------------+ [id number]             Multiphasic, WNL                     +----------+--------+--------+----------------+-------------------+ +---------+--------+--+--------+-+---------+ VertebralPSV cm/s54EDV cm/s9Antegrade +---------+--------+--+--------+-+---------+   Summary: Right Carotid: Velocities in the right ICA are consistent with a 1-39% stenosis. Left Carotid: Velocities in the left ICA are consistent with a 1-39% stenosis. Vertebrals:  Bilateral vertebral arteries demonstrate antegrade flow. Subclavians: Normal flow hemodynamics were seen in bilateral subclavian              arteries. *See table(s) above for measurements and observations.     Preliminary     MR BRAIN WO CONTRAST   Result Date: 07/09/2022 CLINICAL DATA:  Initial evaluation for neuro deficit, stroke suspected. EXAM: MRI HEAD WITHOUT CONTRAST MRA HEAD WITHOUT CONTRAST TECHNIQUE: Multiplanar, multi-echo pulse sequences of the brain and surrounding structures were acquired without intravenous contrast. Angiographic images of the Circle of Willis were acquired using MRA technique without intravenous contrast. COMPARISON:  Prior CT from 07/08/2022. FINDINGS: MRI HEAD FINDINGS Brain: Generalized age-related cerebral atrophy. Patchy T2/FLAIR hyperintensity involving the periventricular and deep white matter both cerebral hemispheres, most consistent with chronic small vessel ischemic disease, mild in nature. Multiple scattered foci of restricted diffusion are seen involving the cortical and subcortical aspects of both cerebral hemispheres, consistent with acute ischemic infarcts. The largest of these is seen at the right posterior corona radiata and measures 1.6 cm (series 5, image 91). Small infarct involving the right thalamus noted (series 5, image 84). Single small infratentorial infarct at the right cerebellum (series 5, image 70). No associated hemorrhage or mass effect. Gray-white matter differentiation otherwise maintained. No visible acute or chronic intracranial blood products. No mass lesion,  midline shift or mass effect no hydrocephalus or extra-axial fluid collection. Pituitary gland and suprasellar region within normal limits. Vascular: Major intracranial vascular flow voids are maintained. Skull and upper cervical spine: Craniocervical junction within normal limits. Bone marrow signal intensity grossly normal. No scalp soft tissue abnormality. Sinuses/Orbits: Prior ocular lens replacement on the right. Mild scattered mucosal thickening noted about the ethmoidal air cells and maxillary sinuses. No mastoid effusion. Other: None. MRA HEAD FINDINGS Anterior circulation: Examination degraded by motion. Visualized distal cervical segments of the internal carotid arteries are patent with antegrade flow. Petrous segments patent bilaterally. Atheromatous change seen throughout the carotid siphons bilaterally. There is associated up to moderate stenoses about the left siphon (series 1042, image 4). No significant stenosis about the contralateral right carotid siphon. A1 segments patent. Normal anterior communicating artery complex. Both anterior cerebral arteries patent without high-grade stenosis. M1 segments patent bilaterally. Right M1 bifurcates early. Severe proximal M2 stenoses noted (series 1042, image 9). Additional diffuse small vessel atheromatous irregularity throughout the MCA branches. Posterior circulation: Both vertebral arteries patent without stenosis. Left vertebral artery dominant. Both PICA patent. Basilar patent to its distal aspect without stenosis. Superior cerebral arteries patent bilaterally. Both  PCAs primarily supplied via the basilar. Atheromatous irregularity throughout both PCAs with associated moderate multifocal P2 stenoses, left worse than right (series 1036, image 2). PCAs remain patent to their distal aspects. Anatomic variants: None significant.  No visible aneurysm. IMPRESSION: MRI HEAD IMPRESSION: 1. Multiple scattered acute ischemic infarcts involving the cortical and  subcortical aspects of both cerebral hemispheres and cerebellum. No associated hemorrhage or mass effect. A central thromboembolic etiology is likely given the various vascular distributions involved. 2. Underlying age-related cerebral atrophy with mild chronic small vessel ischemic disease. MRA HEAD IMPRESSION: 1. Negative intracranial MRA for large vessel occlusion. 2. 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 3. Moderate multifocal stenoses about the left carotid siphon. Electronically Signed   By: Jeannine Boga M.D.   On: 07/09/2022 05:43    MR ANGIO HEAD WO CONTRAST   Result Date: 07/09/2022 CLINICAL DATA:  Initial evaluation for neuro deficit, stroke suspected. EXAM: MRI HEAD WITHOUT CONTRAST MRA HEAD WITHOUT CONTRAST TECHNIQUE: Multiplanar, multi-echo pulse sequences of the brain and surrounding structures were acquired without intravenous contrast. Angiographic images of the Circle of Willis were acquired using MRA technique without intravenous contrast. COMPARISON:  Prior CT from 07/08/2022. FINDINGS: MRI HEAD FINDINGS Brain: Generalized age-related cerebral atrophy. Patchy T2/FLAIR hyperintensity involving the periventricular and deep white matter both cerebral hemispheres, most consistent with chronic small vessel ischemic disease, mild in nature. Multiple scattered foci of restricted diffusion are seen involving the cortical and subcortical aspects of both cerebral hemispheres, consistent with acute ischemic infarcts. The largest of these is seen at the right posterior corona radiata and measures 1.6 cm (series 5, image 91). Small infarct involving the right thalamus noted (series 5, image 84). Single small infratentorial infarct at the right cerebellum (series 5, image 70). No associated hemorrhage or mass effect. Gray-white matter differentiation otherwise maintained. No visible acute or  chronic intracranial blood products. No mass lesion, midline shift or mass effect no hydrocephalus or extra-axial fluid collection. Pituitary gland and suprasellar region within normal limits. Vascular: Major intracranial vascular flow voids are maintained. Skull and upper cervical spine: Craniocervical junction within normal limits. Bone marrow signal intensity grossly normal. No scalp soft tissue abnormality. Sinuses/Orbits: Prior ocular lens replacement on the right. Mild scattered mucosal thickening noted about the ethmoidal air cells and maxillary sinuses. No mastoid effusion. Other: None. MRA HEAD FINDINGS Anterior circulation: Examination degraded by motion. Visualized distal cervical segments of the internal carotid arteries are patent with antegrade flow. Petrous segments patent bilaterally. Atheromatous change seen throughout the carotid siphons bilaterally. There is associated up to moderate stenoses about the left siphon (series 1042, image 4). No significant stenosis about the contralateral right carotid siphon. A1 segments patent. Normal anterior communicating artery complex. Both anterior cerebral arteries patent without high-grade stenosis. M1 segments patent bilaterally. Right M1 bifurcates early. Severe proximal M2 stenoses noted (series 1042, image 9). Additional diffuse small vessel atheromatous irregularity throughout the MCA branches. Posterior circulation: Both vertebral arteries patent without stenosis. Left vertebral artery dominant. Both PICA patent. Basilar patent to its distal aspect without stenosis. Superior cerebral arteries patent bilaterally. Both PCAs primarily supplied via the basilar. Atheromatous irregularity throughout both PCAs with associated moderate multifocal P2 stenoses, left worse than right (series 1036, image 2). PCAs remain patent to their distal aspects. Anatomic variants: None significant.  No visible aneurysm. IMPRESSION: MRI HEAD IMPRESSION: 1. Multiple scattered  acute ischemic infarcts involving the cortical and subcortical aspects  of both cerebral hemispheres and cerebellum. No associated hemorrhage or mass effect. A central thromboembolic etiology is likely given the various vascular distributions involved. 2. Underlying age-related cerebral atrophy with mild chronic small vessel ischemic disease. MRA HEAD IMPRESSION: 1. Negative intracranial MRA for large vessel occlusion. 2. 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 3. Moderate multifocal stenoses about the left carotid siphon. Electronically Signed   By: Jeannine Boga M.D.   On: 07/09/2022 05:43    CT HEAD WO CONTRAST (5MM)   Result Date: 07/08/2022 CLINICAL DATA:  Neurological deficit EXAM: CT HEAD WITHOUT CONTRAST TECHNIQUE: Contiguous axial images were obtained from the base of the skull through the vertex without intravenous contrast. RADIATION DOSE REDUCTION: This exam was performed according to the departmental dose-optimization program which includes automated exposure control, adjustment of the mA and/or kV according to patient size and/or use of iterative reconstruction technique. COMPARISON:  12/11/2019 FINDINGS: Brain: No acute intracranial findings are seen. There are no signs of bleeding within the cranium. Cortical sulci are prominent. There is decreased density in periventricular white matter. Vascular: Scattered arterial calcifications are seen. Skull: Unremarkable. Sinuses/Orbits: Unremarkable. Other: None. IMPRESSION: No acute intracranial findings are seen noncontrast CT brain. Atrophy. Small-vessel disease. Electronically Signed   By: Elmer Picker M.D.   On: 07/08/2022 17:10           Blood pressure (!) 140/66, pulse 71, temperature 98.9 F (37.2 C), temperature source Oral, resp. rate 16, height _0  (1.803 m), weight 96.7 kg, SpO2 98 %.   Medical Problem List and  Plan: 1. 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              -30 day cardiac event monitoring recommended as outpt by neuro             -patient may shower             -ELOS/Goals: 12-14 days, min assist goals with PT and OT 2.  Antithrombotics: -DVT/anticoagulation:  Pharmaceutical: Lovenox start             -antiplatelet therapy: Plavix and aspirin daily for minimum 6 months 3. Pain Management: Tylenol as needed.  Has oxycodone as needed but not using             -See #13 below 4. Mood/Behavior/Sleep: LCSW to evaluate and provide emotional support             -antipsychotic agents: n/a             -Continue Cymbalta 60 mg every evening 5. Neuropsych/cognition: This patient is capable of making decisions on his own behalf. 6. Skin/Wound Care: Routine skin care checks 7. Fluids/Electrolytes/Nutrition: Strict I's and O's and follow-up chemistries             -2 g sodium diet 8: CAD status post PCI 10/31 overlapping DES to LAD             -Plavix and aspirin for at least 6 months             -Continue lipid lowering therapy             -Follow-up with Dr. Virgina Jock 9: PAD: Severe claudication status post bilateral lower extremity arteriogram             -Follow-up as outpatient with Dr. Virgina Jock 10: Heart failure  with reduced ejection fraction:              -Weigh patient daily             -Continue Imdur 30 mg daily             -Continue Norvasc 10 mg daily             -Continue Toprol-XL 25 mg daily 11: OAB: continue Myrbetriq, Flomax 12: Hyperlipidemia: Continue Lipitor 80 mg nightly             -Continue Zetia 10 mg daily 13: Low back pain/radiculopathy/neuropathy:              -Methadone 5 mg every other day             -Zanaflex 4 mg at nighttime -Following with Dr. Laurance Flatten 14: Tobacco use: Cessation counseling 15: Diabetes mellitus: CBGs before every meal, at bedtime.                -Continue Jardiance 10 mg before breakfast             -Continue sliding scale with meals 16: Gout: Continue colchicine 0.6 mg twice daily      Barbie Banner, PA-C 07/10/2022  I have personally performed a face to face diagnostic evaluation of this patient and formulated the key components of the plan.  Additionally, I have personally reviewed laboratory data, imaging studies, as well as relevant notes and concur with the physician assistant's documentation above.  The patient's status has not changed from the original H&P.  Any changes in documentation from the acute care chart have been noted above.  Meredith Staggers, MD, [facility]

## 2022-07-10 NOTE — Progress Notes (Signed)
Mobility Specialist - Progress Note   07/10/22 1200  Mobility  Activity Transferred to/from [facility]  Level of Assistance +2 (takes two people) (for safety)  Assistive Device Front wheel walker  Activity Response Tolerated fair  Mobility Referral Yes  $Mobility charge 1 Mobility   Pt received in bed requesting to use BSC. Pt was +2 for safety and MinA to stand. Pt experienced x2 left knee buckles throughout transfer. Pt was left on [facility] with all needs met.   Larey Seat

## 2022-07-10 NOTE — H&P (Signed)
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  On 07/01/2022 by Dr. Marina Goodell and arrangements made for left heart cath with intervention.  He presented to the Cath Lab on 10/31 and underwent PCI with drug-eluting stents to the LAD.  Recommendations are for Plavix and aspirin daily for minimum of 6 months.  He was admitted for observation.  On 11/1, he complained of left hand numbness and weakness.  He reported left hand numbness for approximately 3 days.  Rapid response team was notified and stat CT of the head was performed.  RI of the brain showed bilateral embolic shower, right greater than left.  MRA of the head with bilateral M2 and P2 stenoses, left sci-fi and atherosclerosis.  Carotid Doppler unremarkable.  On exam, he had mild left facial droop left upper extremity and left proximal lower extremity weakness.  Neurology for infarct most likely related to cardiac procedure although other cardioembolic sources cannot be completely ruled out.  Echo with ejection fraction 50 to 55%.  Recommend 30-day cardiac event monitoring as outpatient.  He is tolerating 2 g sodium diet.The patient requires inpatient physical medicine and rehabilitation evaluations and treatment secondary to dysfunction due to acute ischemic infarcts.  The patient's past medical history is significant for chronic low back pain with radiculopathy, peripheral neuropathy as well as peripheral arterial disease with claudication.  He has an ongoing tobacco history of 27-1/2 pack years.  He has a history of diabetes mellitus type 2 and home regimen includes Jardiance, Glucotrol.  His hemoglobin A1c was 7.0% on 04/28/2022.  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  Review of Systems  Constitutional:   Negative for chills and fever.  HENT:  Positive for hearing loss. Negative for sore throat.   Respiratory:  Positive for cough. Negative for sputum production.   Cardiovascular:  Negative for chest pain and palpitations.  Gastrointestinal:  Negative for vomiting.  Genitourinary:  Negative for dysuria and urgency.  Musculoskeletal:        Complain of left anterior lower rib tenderness  Neurological:  Negative for dizziness and headaches.  Psychiatric/Behavioral:  Negative for depression. The patient does not have insomnia.    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   Obesity 05/12/2017   Osteoarthritis of knee 05/12/2017   Paresthesia 05/12/2017   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   Prostate nodule 05/12/2017   Right rotator cuff tear 10/01/2021   Seborrheic dermatitis 05/12/2017   Past Surgical History:  Procedure Laterality Date   ABDOMINAL AORTOGRAM W/LOWER EXTREMITY N/A 07/07/2022   Procedure: ABDOMINAL AORTOGRAM W/LOWER EXTREMITY;  Surgeon: Nigel Mormon, MD;  Location: [facility];  Service: Cardiovascular;  Laterality: N/A;   CORONARY ATHERECTOMY N/A 07/07/2022   Procedure: CORONARY ATHERECTOMY;  Surgeon: Nigel Mormon, MD;  Location: [facility];  Service: Cardiovascular;  Laterality: N/A;   CORONARY STENT INTERVENTION  N/A 07/07/2022   Procedure: CORONARY STENT INTERVENTION;  Surgeon: Nigel Mormon, MD;  Location: [facility];  Service: Cardiovascular;  Laterality: N/A;   HAND SURGERY Right    INTRAVASCULAR PRESSURE WIRE/FFR STUDY N/A 07/07/2022    Procedure: INTRAVASCULAR PRESSURE WIRE/FFR STUDY;  Surgeon: Nigel Mormon, MD;  Location: [facility];  Service: Cardiovascular;  Laterality: N/A;   LEFT HEART CATH AND CORONARY ANGIOGRAPHY N/A 07/07/2022   Procedure: LEFT HEART CATH AND CORONARY ANGIOGRAPHY;  Surgeon: Nigel Mormon, MD;  Location: [facility];  Service: Cardiovascular;  Laterality: N/A;   ORIF ANKLE FRACTURE Left 08/15/2007   Distal fibular   REPLACEMENT TOTAL KNEE Right 07/12/2012   RIGHT/LEFT HEART CATH AND CORONARY ANGIOGRAPHY N/A 01/12/2018   Procedure: RIGHT/LEFT HEART CATH AND CORONARY ANGIOGRAPHY;  Surgeon: Belva Crome, MD;  Location: [facility];  Service: Cardiovascular;  Laterality: N/A;   RIGHT/LEFT HEART CATH AND CORONARY ANGIOGRAPHY N/A 05/19/2022   Procedure: RIGHT/LEFT HEART CATH AND CORONARY ANGIOGRAPHY;  Surgeon: Nigel Mormon, MD;  Location: [facility];  Service: Cardiovascular;  Laterality: N/A;   SHOULDER SURGERY Right    UMBILICAL HERNIA REPAIR  06/23/2013   Family History  Problem Relation Age of Onset   Hypertension Mother    Hypertension Father    Alzheimer's disease Father    Heart disease Sister    Heart disease Paternal Aunt    Social History:  reports that he has been smoking cigarettes. He has a 27.50 pack-year smoking history. He has been exposed to tobacco smoke. He has never used smokeless tobacco. He reports that he does not drink alcohol and does not use drugs. Allergies:  Allergies  Allergen Reactions   Wasp Venom Anaphylaxis, Itching and Swelling   Medications Prior to Admission  Medication Sig Dispense Refill   amLODipine (NORVASC) 10 MG tablet Take 10 mg by mouth in the morning. Heart Medication     aspirin 81 MG chewable tablet Chew 81 mg by mouth daily at 12 noon. Heart medication     atorvastatin (LIPITOR) 80 MG tablet Take 40 mg by mouth at bedtime. Heart medication     Calcium Carbonate-Vitamin D 600-400 MG-UNIT tablet Take  1 tablet by mouth 2 (two) times daily.     diclofenac Sodium (VOLTAREN) 1 % GEL Apply 1 application topically 4 (four) times daily as needed (pain).     DULoxetine (CYMBALTA) 60 MG capsule Take 60 mg by mouth every evening.      empagliflozin (JARDIANCE) 10 MG TABS tablet Take 1 tablet (10 mg total) by mouth daily before breakfast. 30 tablet 11   EPINEPHrine 0.3 mg/0.3 mL IJ SOAJ injection Inject 0.3 mg into the muscle daily as needed (for anaphylaxis).      glipiZIDE (GLUCOTROL) 10 MG tablet Take 10 mg by mouth in the morning and at bedtime.     ibuprofen (ADVIL) 200 MG tablet Take 600 mg by mouth every 8 (eight) hours as needed for moderate pain.     isosorbide mononitrate (IMDUR) 30 MG 24 hr tablet Take 1 tablet (30 mg total) by mouth daily. 30 tablet 2   metFORMIN (GLUCOPHAGE) 1000 MG tablet Take 1,000 mg by mouth 2 (two) times daily.     methadone (DOLOPHINE) 5 MG tablet Take 5 mg by mouth every other day. For severe chronic pain     metoprolol succinate (TOPROL XL) 25 MG 24 hr tablet Take 1 tablet (25 mg total) by mouth  daily. 30 tablet 3   oxyCODONE (OXY IR/ROXICODONE) 5 MG immediate release tablet Take 5 mg by mouth 4 (four) times daily as needed (severe chronic pain).      sacubitril-valsartan (ENTRESTO) 97-103 MG Take 1 tablet by mouth 2 (two) times daily. 180 tablet 3   tamsulosin (FLOMAX) 0.4 MG CAPS capsule Take 0.4 mg by mouth at bedtime.     tiZANidine (ZANAFLEX) 4 MG tablet Take 4 mg by mouth at bedtime.     cetirizine (ZYRTEC) 10 MG tablet Take 10 mg by mouth daily as needed for allergies.     colchicine 0.6 MG tablet Take 1 tablet (0.6 mg total) by mouth 2 (two) times daily. 60 tablet 2   furosemide (LASIX) 40 MG tablet Take 1 tablet (40 mg total) by mouth daily. 90 tablet 3   omeprazole (PRILOSEC) 40 MG capsule Take 40 mg by mouth daily before breakfast.     Varenicline Tartrate, Starter, (CHANTIX STARTING MONTH PAK) 0.5 MG X 11 & 1 MG X 42 TBPK Take 1 tablet by mouth daily. 30  each 3   Vibegron 75 MG TABS Take 75 mg by mouth daily.        Home: Home Living Family/patient expects to be discharged to:: Private residence Living Arrangements: Spouse/significant other Available Help at Discharge: Family, Available 24 hours/day Type of Home: House Home Access: Level entry, [place]: One level Bathroom Shower/Tub: Chiropodist: Handicapped height Bathroom Accessibility: Yes Home Equipment: Conservation officer, nature (2 wheels), [employer] - single point, BSC/3in1, Tub bench, Grab bars - toilet, Grab bars - tub/shower, Wheelchair - manual, Transport planner  Lives With: Spouse   Functional History: Prior Function Prior Level of Function : Independent/Modified Independent Mobility Comments: SPC for mobility; able to ambulate in community ADLs Comments: mod I  Functional Status:  Mobility: Bed Mobility Overal bed mobility: Needs Assistance Bed Mobility: Supine to Sit Supine to sit: Mod assist Sit to supine: Min assist General bed mobility comments: assist with LLE and trunk, cues for sequencing, increased time Transfers Overall transfer level: Needs assistance Equipment used: Rolling walker (2 wheels) Transfers: Bed to chair/wheelchair/BSC Sit to Stand: +2 physical assistance, +2 safety/equipment, Min assist Bed to/from chair/wheelchair/BSC transfer type:: Lateral/scoot transfer  Lateral/Scoot Transfers: Mod assist General transfer comment: lateral scoot transfer bed to recliner (with swing away armrest). Transferred toward R. Cues for sequencing. Increased time      ADL: ADL Overall ADL's : Needs assistance/impaired Eating/Feeding: Set up, Sitting Grooming: Set up, Sitting, Minimal assistance Grooming Details (indicate cue type and reason): min A for sitting balance Upper Body Bathing: Minimal assistance, Sitting Lower Body Bathing: Maximal assistance, Sit to/from stand Upper Body Dressing : Minimal assistance, Sitting Lower Body  Dressing: Maximal assistance, Sit to/from stand Toilet Transfer: Moderate assistance, +2 for physical assistance, +2 for safety/equipment, Ambulation Toilet Transfer Details (indicate cue type and reason): L knee buckle Toileting- Clothing Manipulation and Hygiene: Maximal assistance, Sit to/from stand Functional mobility during ADLs: Moderate assistance, +2 for physical assistance, +2 for safety/equipment General ADL Comments: limited by poor LUE sensory motor, L knee buckling, impaired balance with posterior and L lateral lean and decreased activity tolerance  Cognition: Cognition Overall Cognitive Status: No family/caregiver present to determine baseline cognitive functioning Arousal/Alertness: Awake/alert Orientation Level: Oriented X4 Attention: Sustained, Focused Focused Attention: Appears intact Sustained Attention: Impaired Sustained Attention Impairment: Verbal basic, Verbal complex Memory: Impaired Memory Impairment: Decreased recall of new information, Decreased short term memory Decreased Short Term Memory: Verbal basic  Awareness: Impaired Awareness Impairment: Emergent impairment Problem Solving: Impaired Problem Solving Impairment: Verbal complex, Functional complex Cognition Arousal/Alertness: Awake/alert Behavior During Therapy: Agitated Overall Cognitive Status: No family/caregiver present to determine baseline cognitive functioning Area of Impairment: Safety/judgement, Awareness, Problem solving, Following commands Following Commands: Follows one step commands with increased time Safety/Judgement: Decreased awareness of safety, Decreased awareness of deficits Awareness: Emergent Problem Solving: Slow processing, Decreased initiation, Difficulty sequencing General Comments: Pt benefits from simple cues and increased time for processing. Quick to anger if overstimulated or staff is moving too fast.  Physical Exam: Blood pressure (!) 140/66, pulse 71, temperature  98.9 F (37.2 C), temperature source Oral, resp. rate 16, height _0  (1.803 m), weight 96.7 kg, SpO2 98 %. Physical Exam Constitutional:      General: He is not in acute distress.    Appearance: He is obese.  HENT:     Head: Normocephalic and atraumatic.     Right Ear: External ear normal.     Left Ear: External ear normal.     Nose: Nose normal.     Mouth/Throat:     Mouth: Mucous membranes are moist.  Eyes:     Extraocular Movements: Extraocular movements intact.     Pupils: Pupils are equal, round, and reactive to light.  Cardiovascular:     Rate and Rhythm: Normal rate and regular rhythm.     Heart sounds: No murmur heard.    No gallop.  Pulmonary:     Effort: Pulmonary effort is normal. No respiratory distress.     Breath sounds: No wheezing.     Comments: Few crackles bilateral bases; good air movement Abdominal:     General: Bowel sounds are normal. There is no distension.     Palpations: Abdomen is soft.     Tenderness: There is no abdominal tenderness.  Musculoskeletal:     Cervical back: Normal range of motion.     Right lower leg: No edema.     Left lower leg: No edema.  Skin:    General: Skin is warm and dry.  Neurological:     General: No focal deficit present.     Mental Status: He is alert and oriented to person, place, and time.     Comments: Reasonable insight and awareness. Functional memory. Speech sl dysarthric. Normal language. RUE 5/5. LUE 3+ to 4-/5 prox to distal. LLE 2/5 HF, 3 to 3+/5 KE, ADF, APF. No focal sensory findings appreciated. No abnl tone. No cerebellar findings appreciated.  Psychiatric:        Mood and Affect: Mood normal.        Behavior: Behavior normal.     Results for orders placed or performed during the hospital encounter of 07/07/22 (from the past 48 hour(s))  Glucose, capillary     Status: Abnormal   Collection Time: 07/08/22 11:42 AM  Result Value Ref Range   Glucose-Capillary 184 (H) 70 - 99 mg/dL    Comment: Glucose  reference range applies only to samples taken after fasting for at least 8 hours.  Glucose, capillary     Status: Abnormal   Collection Time: 07/08/22  4:13 PM  Result Value Ref Range   Glucose-Capillary 145 (H) 70 - 99 mg/dL    Comment: Glucose reference range applies only to samples taken after fasting for at least 8 hours.  Glucose, capillary     Status: Abnormal   Collection Time: 07/08/22  9:10 PM  Result Value Ref Range   Glucose-Capillary 153 (  H) 70 - 99 mg/dL    Comment: Glucose reference range applies only to samples taken after fasting for at least 8 hours.  Lipid panel     Status: Abnormal   Collection Time: 07/09/22  2:30 AM  Result Value Ref Range   Cholesterol 129 0 - 200 mg/dL   Triglycerides 89 <150 mg/dL   HDL 28 (L) >40 mg/dL   Total CHOL/HDL Ratio 4.6 RATIO   VLDL 18 0 - 40 mg/dL   LDL Cholesterol 83 0 - 99 mg/dL    Comment:        Total Cholesterol/HDL:CHD Risk Coronary Heart Disease Risk Table                     Men   Women  1/2 Average Risk   3.4   3.3  Average Risk       5.0   4.4  2 X Average Risk   9.6   7.1  3 X Average Risk  23.4   11.0        Use the calculated Patient Ratio above and the CHD Risk Table to determine the patient's CHD Risk.        ATP III CLASSIFICATION (LDL):  <100     mg/dL   Optimal  100-129  mg/dL   Near or Above                    Optimal  130-159  mg/dL   Borderline  160-189  mg/dL   High  >190     mg/dL   Very High Performed at [facility] [address] [place], [place] [address]   CBC     Status: Abnormal   Collection Time: 07/09/22  2:30 AM  Result Value Ref Range   WBC 8.0 4.0 - 10.5 K/uL   RBC 3.93 (L) 4.22 - 5.81 MIL/uL   Hemoglobin 12.4 (L) 13.0 - 17.0 g/dL   HCT 36.5 (L) 39.0 - 52.0 %   MCV 92.9 80.0 - 100.0 fL   MCH 31.6 26.0 - 34.0 pg   MCHC 34.0 30.0 - 36.0 g/dL   RDW 12.3 11.5 - 15.5 %   Platelets 223 150 - 400 K/uL   nRBC 0.0 0.0 - 0.2 %    Comment: Performed at [facility],  [place] [address] [place], [place] [address]  Glucose, capillary     Status: Abnormal   Collection Time: 07/09/22  7:44 AM  Result Value Ref Range   Glucose-Capillary 157 (H) 70 - 99 mg/dL    Comment: Glucose reference range applies only to samples taken after fasting for at least 8 hours.  Glucose, capillary     Status: Abnormal   Collection Time: 07/09/22 11:42 AM  Result Value Ref Range   Glucose-Capillary 155 (H) 70 - 99 mg/dL    Comment: Glucose reference range applies only to samples taken after fasting for at least 8 hours.  Glucose, capillary     Status: Abnormal   Collection Time: 07/09/22  4:52 PM  Result Value Ref Range   Glucose-Capillary 135 (H) 70 - 99 mg/dL    Comment: Glucose reference range applies only to samples taken after fasting for at least 8 hours.  Glucose, capillary     Status: Abnormal   Collection Time: 07/09/22  9:55 PM  Result Value Ref Range   Glucose-Capillary 114 (H) 70 - 99 mg/dL    Comment: Glucose reference range  applies only to samples taken after fasting for at least 8 hours.  Glucose, capillary     Status: Abnormal   Collection Time: 07/10/22  8:27 AM  Result Value Ref Range   Glucose-Capillary 133 (H) 70 - 99 mg/dL    Comment: Glucose reference range applies only to samples taken after fasting for at least 8 hours.  Basic metabolic panel     Status: Abnormal   Collection Time: 07/10/22  8:33 AM  Result Value Ref Range   Sodium 138 135 - 145 mmol/L   Potassium 3.9 3.5 - 5.1 mmol/L   Chloride 106 98 - 111 mmol/L   CO2 21 (L) 22 - 32 mmol/L   Glucose, Bld 142 (H) 70 - 99 mg/dL    Comment: Glucose reference range applies only to samples taken after fasting for at least 8 hours.   BUN 25 (H) 8 - 23 mg/dL   Creatinine, Ser 1.39 (H) 0.61 - 1.24 mg/dL   Calcium 8.8 (L) 8.9 - 10.3 mg/dL   GFR, Estimated 52 (L) >60 mL/min    Comment: (NOTE) Calculated using the CKD-EPI Creatinine Equation (2021)    Anion gap 11 5 - 15    Comment: Performed at [facility] [address] [place], [place] [address]   ECHOCARDIOGRAM COMPLETE  Result Date: 07/09/2022    ECHOCARDIOGRAM REPORT   Patient Name:   LYDELL MOGA Date of Exam: 07/09/2022 Medical Rec #:  [id number]     Height:       71.0 in Accession #:    [id number]    Weight:       213.2 lb Date of Birth:  July 27, 1946     BSA:          2.167 m Patient Age:    9 years      BP:           145/73 mmHg Patient Gender: M             HR:           71 bpm. Exam Location:  Inpatient Procedure: 2D Echo, Cardiac Doppler, Color Doppler and Intracardiac            Opacification Agent Indications:     Stroke I63.9  History:         Patient has prior history of Echocardiogram examinations, most                  recent 02/26/2022. CAD, PAD; Risk Factors:Hypertension. PCA to                  the LAD on 07/07/22.  Sonographer:     Darlina Sicilian RDCS Referring Phys:  [id number] ASHISH ARORA Diagnosing Phys: Vernell Leep MD IMPRESSIONS  1. Left ventricular ejection fraction, by estimation, is 50 to 55%. The left ventricle has low normal function. The left ventricle has no regional wall motion abnormalities. There is moderate left ventricular hypertrophy. Left ventricular diastolic parameters are consistent with Grade II diastolic dysfunction (pseudonormalization). Elevated left atrial pressure.  2. Right ventricular systolic function is normal. The right ventricular size is normal.  3. Left atrial size was moderately dilated.  4. Moderate pericardial effusion. The pericardial effusion is posterior to the left ventricle. There is no evidence of cardiac tamponade.  5. The mitral valve is normal in structure. No evidence of mitral valve regurgitation. No evidence of mitral stenosis.  6. The aortic valve is calcified. Aortic valve regurgitation is  not visualized. Moderate aortic valve stenosis. Aortic valve area, by VTI measures 1.49 cm. Aortic valve mean gradient measures 18.7 mmHg. Aortic valve Vmax measures 2.82 m/s.  Comparison(s): Previous study on 02/26/2022 reported LVEF 81-84%, grade 1 diastolic dysfunction. FINDINGS  Left Ventricle: Left ventricular ejection fraction, by estimation, is 50 to 55%. The left ventricle has low normal function. The left ventricle has no regional wall motion abnormalities. Definity contrast agent was given IV to delineate the left ventricular endocardial borders. The left ventricular internal cavity size was normal in size. There is moderate left ventricular hypertrophy. Left ventricular diastolic parameters are consistent with Grade II diastolic dysfunction (pseudonormalization).  Elevated left atrial pressure. Right Ventricle: The right ventricular size is normal. No increase in right ventricular wall thickness. Right ventricular systolic function is normal. Left Atrium: Left atrial size was moderately dilated. Right Atrium: Right atrial size was normal in size. Pericardium: A moderately sized pericardial effusion is present. The pericardial effusion is posterior to the left ventricle. There is no evidence of cardiac tamponade. Mitral Valve: The mitral valve is normal in structure. No evidence of mitral valve regurgitation. No evidence of mitral valve stenosis. MV peak gradient, 4.4 mmHg. The mean mitral valve gradient is 2.0 mmHg. Tricuspid Valve: The tricuspid valve is normal in structure. Tricuspid valve regurgitation is not demonstrated. No evidence of tricuspid stenosis. Aortic Valve: The aortic valve is calcified. Aortic valve regurgitation is not visualized. Moderate aortic stenosis is present. Aortic valve mean gradient measures 18.7 mmHg. Aortic valve peak gradient measures 31.9 mmHg. Aortic valve area, by VTI measures 1.49 cm. Pulmonic Valve: The pulmonic valve was normal in structure. Pulmonic valve regurgitation is not visualized. No evidence of pulmonic stenosis. Aorta: The aortic root is normal in size and structure. IAS/Shunts: The interatrial septum was not assessed.  LEFT  VENTRICLE PLAX 2D LVIDd:         5.60 cm      Diastology LVIDs:         4.00 cm      LV e' medial:    3.45 cm/s LV PW:         1.30 cm      LV E/e' medial:  31.6 LV IVS:        1.50 cm      LV e' lateral:   4.73 cm/s LVOT diam:     2.30 cm      LV E/e' lateral: 23.0 LV SV:         85 LV SV Index:   39 LVOT Area:     4.15 cm  LV Volumes (MOD) LV vol d, MOD A2C: 146.0 ml LV vol d, MOD A4C: 183.0 ml LV vol s, MOD A2C: 72.8 ml LV vol s, MOD A4C: 80.4 ml LV SV MOD A2C:     73.2 ml LV SV MOD A4C:     183.0 ml LV SV MOD BP:      86.3 ml RIGHT VENTRICLE TAPSE (M-mode): 1.6 cm LEFT ATRIUM             Index        RIGHT ATRIUM           Index LA diam:        4.70 cm 2.17 cm/m   RA Area:     18.60 cm LA Vol (A2C):   88.3 ml 40.76 ml/m  RA Volume:   45.30 ml  20.91 ml/m LA Vol (A4C):   97.2 ml 44.86 ml/m LA Biplane  Vol: 92.7 ml 42.79 ml/m  AORTIC VALVE AV Area (Vmax):    1.43 cm AV Area (Vmean):   1.34 cm AV Area (VTI):     1.49 cm AV Vmax:           282.33 cm/s AV Vmean:          199.333 cm/s AV VTI:            0.571 m AV Peak Grad:      31.9 mmHg AV Mean Grad:      18.7 mmHg LVOT Vmax:         97.30 cm/s LVOT Vmean:        64.300 cm/s LVOT VTI:          0.205 m LVOT/AV VTI ratio: 0.36  AORTA Ao Root diam: 3.30 cm Ao Asc diam:  3.30 cm MITRAL VALVE MV Area (PHT): 4.39 cm     SHUNTS MV Area VTI:   2.39 cm     Systemic VTI:  0.20 m MV Peak grad:  4.4 mmHg     Systemic Diam: 2.30 cm MV Mean grad:  2.0 mmHg MV Vmax:       1.05 m/s MV Vmean:      68.3 cm/s MV Decel Time: 173 msec MV E velocity: 109.00 cm/s MV A velocity: 94.70 cm/s MV E/A ratio:  1.15 Manish Patwardhan MD Electronically signed by Vernell Leep MD Signature Date/Time: 07/09/2022/12:42:15 PM    Final    VAS US CAROTID  Result Date: 07/09/2022 Carotid Arterial Duplex Study Patient Name:  BRONSYN SHAPPELL  Date of Exam:   07/09/2022 Medical Rec #: [id number]      Accession #:    [id number] Date of Birth: 1945-11-19      Patient Gender: M Patient Age:   24  years Exam Location:  [facility] Procedure:      VAS US CAROTID Referring Phys: Amie Portland --------------------------------------------------------------------------------  Indications:       CVA. Risk Factors:      Hypertension, hyperlipidemia, Diabetes, current smoker,                    coronary artery disease, PAD. Limitations        Today's exam was limited due to patient movement.                    Interference due to saline flush during exam. Comparison Study:  No prior studies. Performing Technologist: Darlin Coco RDMS, RVT  Examination Guidelines: A complete evaluation includes B-mode imaging, spectral Doppler, color Doppler, and power Doppler as needed of all accessible portions of each vessel. Bilateral testing is considered an integral part of a complete examination. Limited examinations for reoccurring indications may be performed as noted.  Right Carotid Findings: +----------+--------+--------+--------+------------------+--------+           PSV cm/sEDV cm/sStenosisPlaque DescriptionComments +----------+--------+--------+--------+------------------+--------+ CCA Prox  98      9                                          +----------+--------+--------+--------+------------------+--------+ CCA Distal67      12                                         +----------+--------+--------+--------+------------------+--------+ ICA Prox  49      11      1-39%   heterogenous               +----------+--------+--------+--------+------------------+--------+ ICA Distal59      14                                         +----------+--------+--------+--------+------------------+--------+ ECA       99                                                 +----------+--------+--------+--------+------------------+--------+ +----------+--------+-------+----------------+-------------------+           PSV cm/sEDV cmsDescribe        Arm Pressure (mmHG)  +----------+--------+-------+----------------+-------------------+ Subclavian132            Multiphasic, WNL                    +----------+--------+-------+----------------+-------------------+ +---------+--------+--+--------+-+---------+ VertebralPSV cm/s65EDV cm/s7Antegrade +---------+--------+--+--------+-+---------+  Left Carotid Findings: +----------+--------+--------+--------+------------------+------------------+           PSV cm/sEDV cm/sStenosisPlaque DescriptionComments           +----------+--------+--------+--------+------------------+------------------+ CCA Prox  111     8                                                    +----------+--------+--------+--------+------------------+------------------+ CCA Distal93      13                                intimal thickening +----------+--------+--------+--------+------------------+------------------+ ICA Prox  74      13      1-39%   heterogenous                         +----------+--------+--------+--------+------------------+------------------+ ICA Distal62      15                                                   +----------+--------+--------+--------+------------------+------------------+ ECA       182                                                          +----------+--------+--------+--------+------------------+------------------+ +----------+--------+--------+----------------+-------------------+           PSV cm/sEDV cm/sDescribe        Arm Pressure (mmHG) +----------+--------+--------+----------------+-------------------+ [id number]             Multiphasic, WNL                    +----------+--------+--------+----------------+-------------------+ +---------+--------+--+--------+-+---------+ VertebralPSV cm/s54EDV cm/s9Antegrade +---------+--------+--+--------+-+---------+   Summary: Right Carotid: Velocities in the right ICA are consistent with a 1-39% stenosis. Left  Carotid: Velocities in the left ICA are consistent with a 1-39% stenosis. Vertebrals:  Bilateral vertebral arteries demonstrate antegrade flow. Subclavians: Normal flow hemodynamics were seen in bilateral subclavian              arteries. *See table(s) above for measurements and observations.     Preliminary    MR BRAIN WO CONTRAST  Result Date: 07/09/2022 CLINICAL DATA:  Initial evaluation for neuro deficit, stroke suspected. EXAM: MRI HEAD WITHOUT CONTRAST MRA HEAD WITHOUT CONTRAST TECHNIQUE: Multiplanar, multi-echo pulse sequences of the brain and surrounding structures were acquired without intravenous contrast. Angiographic images of the Circle of Willis were acquired using MRA technique without intravenous contrast. COMPARISON:  Prior CT from 07/08/2022. FINDINGS: MRI HEAD FINDINGS Brain: Generalized age-related cerebral atrophy. Patchy T2/FLAIR hyperintensity involving the periventricular and deep white matter both cerebral hemispheres, most consistent with chronic small vessel ischemic disease, mild in nature. Multiple scattered foci of restricted diffusion are seen involving the cortical and subcortical aspects of both cerebral hemispheres, consistent with acute ischemic infarcts. The largest of these is seen at the right posterior corona radiata and measures 1.6 cm (series 5, image 91). Small infarct involving the right thalamus noted (series 5, image 84). Single small infratentorial infarct at the right cerebellum (series 5, image 70). No associated hemorrhage or mass effect. Gray-white matter differentiation otherwise maintained. No visible acute or chronic intracranial blood products. No mass lesion, midline shift or mass effect no hydrocephalus or extra-axial fluid collection. Pituitary gland and suprasellar region within normal limits. Vascular: Major intracranial vascular flow voids are maintained. Skull and upper cervical spine: Craniocervical junction within normal limits. Bone marrow signal  intensity grossly normal. No scalp soft tissue abnormality. Sinuses/Orbits: Prior ocular lens replacement on the right. Mild scattered mucosal thickening noted about the ethmoidal air cells and maxillary sinuses. No mastoid effusion. Other: None. MRA HEAD FINDINGS Anterior circulation: Examination degraded by motion. Visualized distal cervical segments of the internal carotid arteries are patent with antegrade flow. Petrous segments patent bilaterally. Atheromatous change seen throughout the carotid siphons bilaterally. There is associated up to moderate stenoses about the left siphon (series 1042, image 4). No significant stenosis about the contralateral right carotid siphon. A1 segments patent. Normal anterior communicating artery complex. Both anterior cerebral arteries patent without high-grade stenosis. M1 segments patent bilaterally. Right M1 bifurcates early. Severe proximal M2 stenoses noted (series 1042, image 9). Additional diffuse small vessel atheromatous irregularity throughout the MCA branches. Posterior circulation: Both vertebral arteries patent without stenosis. Left vertebral artery dominant. Both PICA patent. Basilar patent to its distal aspect without stenosis. Superior cerebral arteries patent bilaterally. Both PCAs primarily supplied via the basilar. Atheromatous irregularity throughout both PCAs with associated moderate multifocal P2 stenoses, left worse than right (series 1036, image 2). PCAs remain patent to their distal aspects. Anatomic variants: None significant.  No visible aneurysm. IMPRESSION: MRI HEAD IMPRESSION: 1. Multiple scattered acute ischemic infarcts involving the cortical and subcortical aspects of both cerebral hemispheres and cerebellum. No associated hemorrhage or mass effect. A central thromboembolic etiology is likely given the various vascular distributions involved. 2. Underlying age-related cerebral atrophy with mild chronic small vessel ischemic disease. MRA HEAD  IMPRESSION: 1. Negative intracranial MRA for large vessel occlusion. 2. 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 3. Moderate multifocal stenoses about the left carotid siphon. Electronically Signed   By: Jeannine Boga M.D.   On: 07/09/2022 05:43   MR ANGIO HEAD WO CONTRAST  Result Date: 07/09/2022 CLINICAL DATA:  Initial evaluation for neuro  deficit, stroke suspected. EXAM: MRI HEAD WITHOUT CONTRAST MRA HEAD WITHOUT CONTRAST TECHNIQUE: Multiplanar, multi-echo pulse sequences of the brain and surrounding structures were acquired without intravenous contrast. Angiographic images of the Circle of Willis were acquired using MRA technique without intravenous contrast. COMPARISON:  Prior CT from 07/08/2022. FINDINGS: MRI HEAD FINDINGS Brain: Generalized age-related cerebral atrophy. Patchy T2/FLAIR hyperintensity involving the periventricular and deep white matter both cerebral hemispheres, most consistent with chronic small vessel ischemic disease, mild in nature. Multiple scattered foci of restricted diffusion are seen involving the cortical and subcortical aspects of both cerebral hemispheres, consistent with acute ischemic infarcts. The largest of these is seen at the right posterior corona radiata and measures 1.6 cm (series 5, image 91). Small infarct involving the right thalamus noted (series 5, image 84). Single small infratentorial infarct at the right cerebellum (series 5, image 70). No associated hemorrhage or mass effect. Gray-white matter differentiation otherwise maintained. No visible acute or chronic intracranial blood products. No mass lesion, midline shift or mass effect no hydrocephalus or extra-axial fluid collection. Pituitary gland and suprasellar region within normal limits. Vascular: Major intracranial vascular flow voids are maintained. Skull and upper cervical spine: Craniocervical  junction within normal limits. Bone marrow signal intensity grossly normal. No scalp soft tissue abnormality. Sinuses/Orbits: Prior ocular lens replacement on the right. Mild scattered mucosal thickening noted about the ethmoidal air cells and maxillary sinuses. No mastoid effusion. Other: None. MRA HEAD FINDINGS Anterior circulation: Examination degraded by motion. Visualized distal cervical segments of the internal carotid arteries are patent with antegrade flow. Petrous segments patent bilaterally. Atheromatous change seen throughout the carotid siphons bilaterally. There is associated up to moderate stenoses about the left siphon (series 1042, image 4). No significant stenosis about the contralateral right carotid siphon. A1 segments patent. Normal anterior communicating artery complex. Both anterior cerebral arteries patent without high-grade stenosis. M1 segments patent bilaterally. Right M1 bifurcates early. Severe proximal M2 stenoses noted (series 1042, image 9). Additional diffuse small vessel atheromatous irregularity throughout the MCA branches. Posterior circulation: Both vertebral arteries patent without stenosis. Left vertebral artery dominant. Both PICA patent. Basilar patent to its distal aspect without stenosis. Superior cerebral arteries patent bilaterally. Both PCAs primarily supplied via the basilar. Atheromatous irregularity throughout both PCAs with associated moderate multifocal P2 stenoses, left worse than right (series 1036, image 2). PCAs remain patent to their distal aspects. Anatomic variants: None significant.  No visible aneurysm. IMPRESSION: MRI HEAD IMPRESSION: 1. Multiple scattered acute ischemic infarcts involving the cortical and subcortical aspects of both cerebral hemispheres and cerebellum. No associated hemorrhage or mass effect. A central thromboembolic etiology is likely given the various vascular distributions involved. 2. Underlying age-related cerebral atrophy with mild  chronic small vessel ischemic disease. MRA HEAD IMPRESSION: 1. Negative intracranial MRA for large vessel occlusion. 2. 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 3. Moderate multifocal stenoses about the left carotid siphon. Electronically Signed   By: Jeannine Boga M.D.   On: 07/09/2022 05:43   CT HEAD WO CONTRAST (5MM)  Result Date: 07/08/2022 CLINICAL DATA:  Neurological deficit EXAM: CT HEAD WITHOUT CONTRAST TECHNIQUE: Contiguous axial images were obtained from the base of the skull through the vertex without intravenous contrast. RADIATION DOSE REDUCTION: This exam was performed according to the departmental dose-optimization program which includes automated exposure control, adjustment of the mA and/or kV according to patient size and/or use of iterative reconstruction technique. COMPARISON:  12/11/2019 FINDINGS:  Brain: No acute intracranial findings are seen. There are no signs of bleeding within the cranium. Cortical sulci are prominent. There is decreased density in periventricular white matter. Vascular: Scattered arterial calcifications are seen. Skull: Unremarkable. Sinuses/Orbits: Unremarkable. Other: None. IMPRESSION: No acute intracranial findings are seen noncontrast CT brain. Atrophy. Small-vessel disease. Electronically Signed   By: Elmer Picker M.D.   On: 07/08/2022 17:10      Blood pressure (!) 140/66, pulse 71, temperature 98.9 F (37.2 C), temperature source Oral, resp. rate 16, height _0  (1.803 m), weight 96.7 kg, SpO2 98 %.  Medical Problem List and Plan: 1. 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   -30 day cardiac event monitoring recommended as outpt by neuro  -patient may shower  -ELOS/Goals: 12-14 days, min assist goals with PT and OT 2.   Antithrombotics: -DVT/anticoagulation:  Pharmaceutical: Lovenox start  -antiplatelet therapy: Plavix and aspirin daily for minimum 6 months 3. Pain Management: Tylenol as needed.  Has oxycodone as needed but not using  -See #13 below 4. Mood/Behavior/Sleep: LCSW to evaluate and provide emotional support  -antipsychotic agents: n/a  -Continue Cymbalta 60 mg every evening 5. Neuropsych/cognition: This patient is capable of making decisions on his own behalf. 6. Skin/Wound Care: Routine skin care checks 7. Fluids/Electrolytes/Nutrition: Strict I's and O's and follow-up chemistries  -2 g sodium diet 8: CAD status post PCI 10/31 overlapping DES to LAD  -Plavix and aspirin for at least 6 months  -Continue lipid lowering therapy  -Follow-up with Dr. Virgina Jock 9: PAD: Severe claudication status post bilateral lower extremity arteriogram  -Follow-up as outpatient with Dr. Virgina Jock 10: Heart failure with reduced ejection fraction:   -Weigh patient daily  -Continue Imdur 30 mg daily  -Continue Norvasc 10 mg daily  -Continue Toprol-XL 25 mg daily 11: OAB: continue Myrbetriq, Flomax 12: Hyperlipidemia: Continue Lipitor 80 mg nightly  -Continue Zetia 10 mg daily 13: Low back pain/radiculopathy/neuropathy:   -Methadone 5 mg every other day  -Zanaflex 4 mg at nighttime -Following with Dr. Laurance Flatten 14: Tobacco use: Cessation counseling 15: Diabetes mellitus: CBGs before every meal, at bedtime.    -Continue Jardiance 10 mg before breakfast  -Continue sliding scale with meals 16: Gout: Continue colchicine 0.6 mg twice daily       Barbie Banner, PA-C 07/10/2022

## 2022-07-10 NOTE — PMR Pre-admission (Signed)
PMR Admission Coordinator Pre-Admission Assessment  Patient: Thomas [name] is an 76 y.o., male MRN: [id number] DOB: 1946-02-23 Height: _0  (180.3 cm) Weight: 96.7 kg  Insurance Information HMO:     PPO:      PCP:      IPA:      80/20: yes     OTHER:  PRIMARY: Medicare A & B      Policy#: [id number]     Subscriber: patient CM Name:       Phone#:      Fax#:  Pre-Cert#:       Employer:  Benefits:  Phone #: verified eligibility via [place] on 07/09/22     Name:  Eff. Date: Part A & B effective 07/08/09     Deduct: $1,600      Out of Pocket Max: NA      Life Max: NA CIR: 100% coverage      SNF: 100% coverage days 1-20, 80% coverage days 21-100 Outpatient: 80% coverage     Co-Pay: 20% Home Health: 100% coverage      Co-Pay:  DME: 80% coverage     Co-Pay: 20% Providers: pt's choice SECONDARY: Tricare for Life      Policy#: [id number] Phone#: [phone]  Financial Counselor:       Phone#:   The "Data Collection Information Summary" for patients in Inpatient Rehabilitation Facilities with attached "Privacy Act [place] Records" was provided and verbally reviewed with: Patient and Family  Emergency Contact Information Contact Information     Name Relation Home Work Mobile   Turnbo,[name] Spouse [phone]  [phone]       Current Medical History  Patient Admitting Diagnosis: CVA History of Present Illness: Pt is 75 year old male with medical hx significant for: HTN, newly reduced ejection fraction, CAD, unstable angina, hyperlipidemia, GERD, obesity, low back pain, lumbar radiculopathy, diabetes Pt presented to [facility] on 07/07/22 for abdominal aortogram with lower extremity, left heart cath and coronary angiography. Rapid response called d/t pt report of left sided weakness, numbness on 07/08/22. Neurology consulted. CT showed no acute abnormality. MRI showed bilateral embolic shower, right more than left, larger than right CR. EF 50-55%. MRA negative  for LVO, showed bilateral M2 and P2 stenosis.  Therapy evaluations completed and CIR recommended d/t pt's deficits in functional mobility, inability to complete ADLs independently, and cognitive deficits. Complete NIHSS TOTAL: 5  Patient's medical record from [facility] has been reviewed by the rehabilitation admission coordinator and physician.  Past Medical History  Past Medical History:  Diagnosis Date   Actinic keratosis 05/12/2017   Back pain    Chronic systolic (congestive) heart failure ([place]) 05/06/2022   Depression 05/12/2017   Diabetes ([place])    GERD (gastroesophageal reflux disease) 05/12/2017   Hx of colonic polyps 05/12/2017   Hyperlipidemia 05/12/2017   Hypertension    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/2017   Low back pain 05/12/2017   Lumbar radiculopathy 05/12/2017   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   Obesity 05/12/2017   Osteoarthritis of knee 05/12/2017   Paresthesia 05/12/2017   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   Prostate nodule 05/12/2017   Right rotator cuff tear 10/01/2021   Seborrheic dermatitis 05/12/2017    Has the patient had major surgery during 100 days prior to admission? No  Family History   family history includes Alzheimer's disease in his father; Heart disease in his paternal aunt and sister; Hypertension in his father and mother.  Current Medications  Current Facility-Administered Medications:  0.9 %  sodium chloride infusion, 250 mL, Intravenous, PRN, Patwardhan, Manish J, MD   acetaminophen (TYLENOL) tablet 650 mg, 650 mg, Oral, Q4H PRN, Patwardhan, Manish J, MD, 650 mg at 07/09/22 2111   amLODipine (NORVASC) tablet 10 mg, 10 mg, Oral, q AM, Patwardhan, Manish J, MD, 10 mg at 07/10/22 0600   aspirin chewable tablet 81 mg, 81 mg, Oral, Q1200, Patwardhan, Manish J, MD, 81 mg at 07/09/22 1217   atorvastatin (LIPITOR) tablet 80 mg, 80 mg, Oral, QHS, Rosalin Hawking, MD, 80 mg at 07/09/22 2111   clopidogrel (PLAVIX) tablet 75 mg, 75 mg, Oral, Q breakfast, Patwardhan, Manish  J, MD, 75 mg at 07/10/22 0953   colchicine tablet 0.6 mg, 0.6 mg, Oral, BID, Patwardhan, Manish J, MD, 0.6 mg at 07/10/22 0953   DULoxetine (CYMBALTA) DR capsule 60 mg, 60 mg, Oral, QPM, Patwardhan, Manish J, MD, 60 mg at 07/09/22 1744   empagliflozin (JARDIANCE) tablet 10 mg, 10 mg, Oral, QAC breakfast, Patwardhan, Manish J, MD, 10 mg at 07/10/22 0953   ezetimibe (ZETIA) tablet 10 mg, 10 mg, Oral, Daily, Patwardhan, Manish J, MD, 10 mg at 07/10/22 0953   insulin aspart (novoLOG) injection 0-15 Units, 0-15 Units, Subcutaneous, TID WC, Patwardhan, Manish J, MD, 2 Units at 07/10/22 0845   isosorbide mononitrate (IMDUR) 24 hr tablet 30 mg, 30 mg, Oral, Daily, Patwardhan, Manish J, MD, 30 mg at 07/10/22 0953   methadone (DOLOPHINE) tablet 5 mg, 5 mg, Oral, QODAY, Patwardhan, Manish J, MD, 5 mg at 07/10/22 0953   metoprolol succinate (TOPROL-XL) 24 hr tablet 25 mg, 25 mg, Oral, Daily, Patwardhan, Manish J, MD, 25 mg at 07/10/22 0953   mirabegron ER (MYRBETRIQ) tablet 25 mg, 25 mg, Oral, Daily, Patwardhan, Manish J, MD, 25 mg at 07/10/22 0953   nitroGLYCERIN 50 mg in dextrose 5 % 250 mL (0.2 mg/mL) infusion, 2-200 mcg/min, Intravenous, Titrated, Patwardhan, Manish J, MD   ondansetron (ZOFRAN) injection 4 mg, 4 mg, Intravenous, Q6H PRN, Patwardhan, Manish J, MD   oxyCODONE (Oxy IR/ROXICODONE) immediate release tablet 5 mg, 5 mg, Oral, QID PRN, Patwardhan, Manish J, MD   sodium chloride flush (NS) 0.9 % injection 3 mL, 3 mL, Intravenous, Q12H, Patwardhan, Manish J, MD, 3 mL at 07/10/22 0954   sodium chloride flush (NS) 0.9 % injection 3 mL, 3 mL, Intravenous, PRN, Patwardhan, Manish J, MD   tamsulosin (FLOMAX) capsule 0.4 mg, 0.4 mg, Oral, QHS, Patwardhan, Manish J, MD, 0.4 mg at 07/09/22 2111   tiZANidine (ZANAFLEX) tablet 4 mg, 4 mg, Oral, QHS, Patwardhan, Manish J, MD, 4 mg at 07/09/22 2111  Patients Current Diet:  Diet Order             Diet - low sodium heart healthy           Diet 2 gram sodium  Room service appropriate? Yes; Fluid consistency: Thin  Diet effective now           Diet - low sodium heart healthy                   Precautions / Restrictions Precautions Precautions: Fall Precaution Comments: L hemi Restrictions Weight Bearing Restrictions: No   Has the patient had 2 or more falls or a fall with injury in the past year? Yes  Prior Activity Level Community (5-7x/wk): drives, gets out of house 3-4 days/week  Prior Functional Level Self Care: Did the patient need help bathing, dressing, using the toilet or eating? Independent  Indoor Mobility: Did the patient need assistance with walking from room to room (with or without device)? Independent  Stairs: Did the patient need assistance with internal or external stairs (with or without device)? Independent  Functional Cognition: Did the patient need help planning regular tasks such as shopping or remembering to take medications? Independent  Patient Information Are you of Hispanic, Latino/a,or Spanish origin?: A. No, not of Hispanic, Latino/a, or Spanish origin What is your race?: A. White Do you need or want an interpreter to communicate with a doctor or health care staff?: 0. No  Patient's Response To:  Health Literacy and Transportation Is the patient able to respond to health literacy and transportation needs?: Yes Health Literacy - How often do you need to have someone help you when you read instructions, pamphlets, or other written material from your doctor or pharmacy?: Sometimes In the past 12 months, has lack of transportation kept you from medical appointments or from getting medications?: No In the past 12 months, has lack of transportation kept you from meetings, work, or from getting things needed for daily living?: No  Home Assistive Devices / [place] Devices/Equipment: Radio producer (specify quad or straight) Home Equipment: Conservation officer, nature (2 wheels), [employer] - single point, BSC/3in1,  Tub bench, Grab bars - toilet, Grab bars - tub/shower, Wheelchair - manual, Transport planner  Prior Device Use: Indicate devices/aids used by the patient prior to current illness, exacerbation or injury?  cane  Current Functional Level Cognition  Arousal/Alertness: Awake/alert Overall Cognitive Status: No family/caregiver present to determine baseline cognitive functioning Orientation Level: Oriented X4 Following Commands: Follows one step commands consistently Safety/Judgement: Decreased awareness of deficits, Decreased awareness of safety General Comments: Pt follows commands well throughout session, appreciative of education & session. Attention: Sustained, Focused Focused Attention: Appears intact Sustained Attention: Impaired Sustained Attention Impairment: Verbal basic, Verbal complex Memory: Impaired Memory Impairment: Decreased recall of new information, Decreased short term memory Decreased Short Term Memory: Verbal basic Awareness: Impaired Awareness Impairment: Emergent impairment Problem Solving: Impaired Problem Solving Impairment: Verbal complex, Functional complex    Extremity Assessment (includes Sensation/Coordination)  Upper Extremity Assessment: LUE deficits/detail RUE Deficits / Details: overall WFL LUE Deficits / Details: unable to hold shoulder flexion against gravity, poor FM coordination, paraesthesia noted throughout arm and no respose to noxious stim on digits wtih eyes closed. significantly impaired sensory motor LUE Sensation: decreased light touch, decreased proprioception LUE Coordination: decreased fine motor, decreased gross motor  Lower Extremity Assessment: LLE deficits/detail, RLE deficits/detail RLE Deficits / Details: ROM WFL; MMT 5/5 throughout LLE Deficits / Details: ROM WFL; MMT : ankle DF 4/5, knee ext 4/5, hip flexion 3/5    ADLs  Overall ADL's : Needs assistance/impaired Eating/Feeding: Set up, Sitting Grooming: Set up, Sitting, Minimal  assistance Grooming Details (indicate cue type and reason): min A for sitting balance Upper Body Bathing: Minimal assistance, Sitting Lower Body Bathing: Maximal assistance, Sit to/from stand Upper Body Dressing : Minimal assistance, Sitting Lower Body Dressing: Maximal assistance, Sit to/from stand Toilet Transfer: Moderate assistance, +2 for physical assistance, +2 for safety/equipment, Ambulation Toilet Transfer Details (indicate cue type and reason): L knee buckle Toileting- Clothing Manipulation and Hygiene: Maximal assistance, Sit to/from stand Functional mobility during ADLs: Moderate assistance, +2 for physical assistance, +2 for safety/equipment General ADL Comments: limited by poor LUE sensory motor, L knee buckling, impaired balance with posterior and L lateral lean and decreased activity tolerance    Mobility  Overal bed mobility: Needs Assistance Bed Mobility: Supine  to Sit Supine to sit: Mod assist Sit to supine: Min assist General bed mobility comments: pt received on BSC, left in recliner    Transfers  Overall transfer level: Needs assistance Equipment used: Rolling walker (2 wheels) Transfers: Sit to/from Stand Sit to Stand: Mod assist (STS from [facility] & EOB with RW & cuing for hand placement to push to standing with 1UE.) Bed to/from chair/wheelchair/BSC transfer type:: Step pivot Step pivot transfers: Mod assist (Pt transfers bed>recliner on L with RW & mod assist with max cuing. PT provides manual facilitation for weight shifting L<>R, cuing for midline orientation, cuing for stepping pattern.)  Lateral/Scoot Transfers: Mod assist General transfer comment: lateral scoot transfer bed to recliner (with swing away armrest). Transferred toward R. Cues for sequencing. Increased time    Ambulation / Gait / Stairs / Wheelchair Mobility  Ambulation/Gait Ambulation/Gait assistance: Mod assist, Max assist Gait Distance (Feet): 6 Feet Assistive device: Rolling walker (2  wheels) Gait Pattern/deviations: Step-to pattern, Decreased step length - right, Decreased dorsiflexion - right, Decreased step length - left, Decreased dorsiflexion - left, Decreased stride length General Gait Details: Pt ambulates with RW & mod increasing to max assist 2/2 L lateral lean with decreased ability to correct with fatigue. Pt requires cuing for stepping pattern, increased step length RLE, cuing & manual facilitation for weight shifting R to allow increased ease of LLE foot clearance, cuing for upright posture vs forward lean. Pt with difficulty maintaining grasp on RW with LUE. Gait velocity: decreased    Posture / Balance Dynamic Sitting Balance Sitting balance - Comments: supervision static sitting EOB, L lateral lean Balance Overall balance assessment: Needs assistance Sitting-balance support: Feet supported, Single extremity supported Sitting balance-Leahy Scale: Fair Sitting balance - Comments: supervision static sitting EOB, L lateral lean Standing balance support: Bilateral upper extremity supported, During functional activity, Reliant on assistive device for balance Standing balance-Leahy Scale: Poor Standing balance comment: BUE support on RW in standing while PT performs dependent assist peri hygiene.    Special needs/care consideration Diabetic management novoLOG 0-15 units 3x daily with meals, External Urinary Catheter   Previous Home Environment (from acute therapy documentation) Living Arrangements: Spouse/significant other  Lives With: Spouse Available Help at Discharge: Family, Available 24 hours/day Type of Home: House Home Layout: One level Home Access: Level entry, Elevator Bathroom Shower/Tub: Chiropodist: Handicapped height Bathroom Accessibility: Yes How Accessible: Accessible via walker [place]: No  Discharge Living Setting Plans for Discharge Living Setting: Patient's home Type of Home at Discharge: House Discharge  Home Layout: One level Discharge Home Access: Level entry Discharge Bathroom Shower/Tub: Tub/shower unit Discharge Bathroom Toilet: Handicapped height Discharge Bathroom Accessibility: Yes How Accessible: Accessible via walker Does the patient have any problems obtaining your medications?: No  Social/Family/Support Systems Anticipated Caregiver: Alijah Akram, wife Anticipated Caregiver's Contact Information: [phone] Caregiver Availability: 24/7 Discharge Plan Discussed with Primary Caregiver: Yes Is Caregiver In Agreement with Plan?: Yes Does Caregiver/Family have Issues with Lodging/Transportation while Pt is in Rehab?: No  Goals Patient/Family Goal for Rehab: Supervision-Min A: PT/OT Expected length of stay: 12-14 days Pt/Family Agrees to Admission and willing to participate: Yes Program Orientation Provided & Reviewed with Pt/Caregiver Including Roles  & Responsibilities: Yes  Decrease burden of Care through IP rehab admission: NA  Possible need for SNF placement upon discharge: Not anticipated  Patient Condition: I have reviewed medical records from [facility], spoken with CSW, and patient and spouse. I met with patient at the bedside  for inpatient rehabilitation assessment.  Patient will benefit from ongoing PT, OT, and SLP, can actively participate in 3 hours of therapy a day 5 days of the week, and can make measurable gains during the admission.  Patient will also benefit from the coordinated team approach during an Inpatient Acute Rehabilitation admission.  The patient will receive intensive therapy as well as Rehabilitation physician, nursing, social worker, and care management interventions.  Due to bladder management, safety, disease management, medication administration, pain management, and patient education the patient requires 24 hour a day rehabilitation nursing.  The patient is currently Mod A with mobility Min-Max A with and basic ADLs.  Discharge setting and  therapy post discharge at home with home health is anticipated.  Patient has agreed to participate in the Acute Inpatient Rehabilitation Program and will admit today.  Preadmission Screen Completed By:  Bethel Born, 07/10/2022 11:58 AM ______________________________________________________________________   Discussed status with Dr. Naaman Plummer on 07/10/22  at 11:58 AM and received approval for admission today.  Admission Coordinator:  Bethel Born, CCC-SLP, time 11:58 AM/Date 07/10/22    Assessment/Plan: Diagnosis: embolic CVA Does the need for close, 24 hr/day Medical supervision in concert with the patient's rehab needs make it unreasonable for this patient to be served in a less intensive setting? Yes Co-Morbidities requiring supervision/potential complications: HTN, CAD, chr LBP with radic Due to bladder management, bowel management, safety, skin/wound care, disease management, medication administration, pain management, and patient education, does the patient require 24 hr/day rehab nursing? Yes Does the patient require coordinated care of a physician, rehab nurse, PT, OT,     to address physical and functional deficits in the context of the above medical diagnosis(es)? Yes Addressing deficits in the following areas: balance, endurance, locomotion, strength, transferring, bowel/bladder control, bathing, dressing, feeding, grooming, toileting, and psychosocial support Can the patient actively participate in an intensive therapy program of at least 3 hrs of therapy 5 days a week? Yes The potential for patient to make measurable gains while on inpatient rehab is excellent Anticipated functional outcomes upon discharge from inpatient rehab: min assist PT, min assist OT, n/a SLP Estimated rehab length of stay to reach the above functional goals is: 12-14 days Anticipated discharge destination: Home 10. Overall Rehab/Functional Prognosis: excellent   MD Signature: Meredith Staggers, MD, [place] Director Rehabilitation Services 07/10/2022

## 2022-07-10 NOTE — Progress Notes (Signed)
Inpatient Rehab Admissions Coordinator:  There is a bed available for pt in CIR today. Dr. Virgina Jock is aware and in agreement. Pt, NSG, TOC made aware. Attempted to contact pt's wife. Received no answer and not able to leave a message.    Gayland Curry, Bertha, [place] Admissions Coordinator [phone]

## 2022-07-10 NOTE — TOC Initial Note (Signed)
Transition of Care [facility]) - Initial/Assessment Note    Patient Details  Name: Thomas [name] MRN: [id number] Date of Birth: [birth year]  [facility]) CM/SW Contact:    Bethena Roys, RN Phone Number: 07/10/2022, 10:21 AM  Clinical Narrative:  Patient was discussed in morning progression rounds. PTA patient was from home with spouse. Inpatient Rehab Coordinator has been following the patient as a possible rehab candidate. Insurance has approved the patient and the plan will be to admit to inpatient rehab today. No further needs identified from Case Manager at this time.                  Expected Discharge Plan: IP Rehab Facility Barriers to Discharge: No Barriers Identified   Patient Goals and CMS Choice Patient states their goals for this hospitalization and ongoing recovery are:: CIR   Choice offered to / list presented to : NA  Expected Discharge Plan and Services Expected Discharge Plan: [place] In-house Referral: NA Discharge Planning Services: CM Consult Post Acute Care Choice: IP Rehab Living arrangements for the past 2 months: Single Family Home Expected Discharge Date: 07/10/22                 DME Agency: NA       HH Arranged: NA  Prior Living Arrangements/Services Living arrangements for the past 2 months: Single Family Home Lives with:: Spouse Patient language and need for interpreter reviewed:: Yes Do you feel safe going back to the place where you live?: Yes      Need for Family Participation in Patient Care: Yes (Comment) Care giver support system in place?: Yes (comment) Current home services: DME (walker, cane, shower seat, 3 n 1) Criminal Activity/Legal Involvement Pertinent to Current Situation/Hospitalization: No - Comment as needed  Activities of Daily Living Home Assistive Devices/Equipment: Cane (specify quad or straight) ADL Screening (condition at time of admission) Patient's cognitive ability adequate to safely  complete daily activities?: Yes Is the patient deaf or have difficulty hearing?: Yes Does the patient have difficulty seeing, even when wearing glasses/contacts?: No Does the patient have difficulty concentrating, remembering, or making decisions?: No Patient able to express need for assistance with ADLs?: No Does the patient have difficulty dressing or bathing?: No Independently performs ADLs?: No Communication: Independent Dressing (OT): Independent Grooming: Independent Feeding: Independent Bathing: Needs assistance Is this a change from baseline?: Pre-admission baseline Toileting: Needs assistance Is this a change from baseline?: Pre-admission baseline In/Out Bed: Needs assistance Is this a change from baseline?: Pre-admission baseline Walks in Home: Needs assistance Is this a change from baseline?: Pre-admission baseline Does the patient have difficulty walking or climbing stairs?: Yes Weakness of Legs: Both Weakness of Arms/Hands: None  Permission Sought/Granted Permission sought to share information with : Family Supports, Case Manager  Emotional Assessment Appearance:: Appears stated age       Alcohol / Substance Use: Not Applicable Psych Involvement: No (comment)  Admission diagnosis:  CAD (coronary artery disease) [I25.10] Patient Active Problem List   Diagnosis Date Noted   Cerebrovascular accident (CVA) due to embolism of precerebral artery ([place]) 07/09/2022   CAD (coronary artery disease) 07/07/2022   Bilateral renal cysts 42/59/5638   Chronic systolic (congestive) heart failure ([place]) 05/06/2022   Aortic valve disorder 05/06/2022   Unstable angina ([place]) 05/06/2022   Regional wall motion abnormality of heart 05/06/2022   PAD (peripheral artery disease) ([place]) 75/64/3329   Acute systolic heart failure ([place]) 05/06/2022   Pericardial effusion 12/24/2021  Pleural effusion 11/04/2021   Pulmonary edema 11/04/2021   Incidental pulmonary nodule, > 85m and < 834m 11/04/2021   Spinal stenosis at L4-L5 level 10/01/2021   Peripheral arterial disease (HCPine [place]01/25/2023   Type 2 diabetes mellitus with diabetic neuropathy, unspecified (HCL[place]01/20/2023   Sensorineural hearing loss, bilateral 09/26/2021   Personal history of methicillin resistant Staphylococcus aureus 09/26/2021   Degeneration of lumbar or lumbosacral intervertebral disc 09/26/2021   Tobacco dependence 09/26/2021   Excessive daytime sleepiness 11/13/2020   Narcotic habituation, continuous (HCEaton03/05/2021   At risk for central sleep apnea 11/13/2020   History of claustrophobia 11/13/2020   Obesity (BMI 30.0-34.9) 11/13/2020   Nocturia 11/13/2020   PTSD (post-traumatic stress disorder) 11/13/2020   Cognitive and behavioral changes 12/28/2019   Essential hypertension    Acute metabolic encephalopathy    Coronary artery disease 02/11/2018   Claudication in peripheral vascular disease (HCAlexander06/03/2018   Atypical chest pain 01/12/2018   Coronary artery calcification 01/07/2018   CKD (chronic kidney disease) stage 3, GFR 30-59 ml/min (HCC) 11/25/2017   Chronic diastolic heart failure (HCBeaul[place]09/02/2017   Aortic stenosis, mild 05/13/2017   Low back pain 05/12/2017   GERD (gastroesophageal reflux disease) 05/12/2017   Prostate nodule 05/12/2017   Depression 05/12/2017   Hx of colonic polyps 05/12/2017   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/2017   Hyperlipidemia 05/12/2017   Hypertensive heart disease with heart failure (HCLeilani Estates09/01/2017   Osteoarthritis of left knee 05/12/2017   Sleep apnea 05/12/2017   Actinic keratosis 05/12/2017   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   Seborrheic dermatitis 05/12/2017   Paresthesia 05/12/2017   PCP:  RuHaydee SalterMD [place]:   [facility] [address]hone: [phone]: 91[place] [id number]one: [phone]: [phone] [address] [place]: [phone]: [phone] Risk Interventions     No data to display

## 2022-07-10 NOTE — Progress Notes (Signed)
Signed     PMR Admission Coordinator Pre-Admission Assessment   Patient: Thomas [name] is an 76 y.o., male MRN: [id number] DOB: 04-Jan-1946 Height: 5' 11" (180.3 cm) Weight: 96.7 kg   Insurance Information HMO:     PPO:      PCP:      IPA:      80/20: yes     OTHER:  PRIMARY: Medicare A & B      Policy#: [id number]     Subscriber: patient CM Name:       Phone#:      Fax#:  Pre-Cert#:       Employer:  Benefits:  Phone #: verified eligibility via [place] on 07/09/22     Name:  Eff. Date: Part A & B effective 07/08/09     Deduct: $1,600      Out of Pocket Max: NA      Life Max: NA CIR: 100% coverage      SNF: 100% coverage days 1-20, 80% coverage days 21-100 Outpatient: 80% coverage     Co-Pay: 20% Home Health: 100% coverage      Co-Pay:  DME: 80% coverage     Co-Pay: 20% Providers: pt's choice SECONDARY: Tricare for Life      Policy#: [id number] Phone#: [phone]   Financial Counselor:       Phone#:    The "Data Collection Information Summary" for patients in Inpatient Rehabilitation Facilities with attached "Privacy Act [place] Records" was provided and verbally reviewed with: Patient and Family   Emergency Contact Information Contact Information       Name Relation Home Work Mobile    Caprio,[name] Spouse [phone]   [phone]           Current Medical History  Patient Admitting Diagnosis: CVA History of Present Illness: Pt is 76 year old male with medical hx significant for: HTN, newly reduced ejection fraction, CAD, unstable angina, hyperlipidemia, GERD, obesity, low back pain, lumbar radiculopathy, diabetes Pt presented to [facility] on 07/07/22 for abdominal aortogram with lower extremity, left heart cath and coronary angiography. Rapid response called d/t pt report of left sided weakness, numbness on 07/08/22. Neurology consulted. CT showed no acute abnormality. MRI showed bilateral embolic shower, right more than left, larger than right CR.  EF 50-55%. MRA negative for LVO, showed bilateral M2 and P2 stenosis.  Therapy evaluations completed and CIR recommended d/t pt's deficits in functional mobility, inability to complete ADLs independently, and cognitive deficits. Complete NIHSS TOTAL: 5   Patient's medical record from [facility] has been reviewed by the rehabilitation admission coordinator and physician.   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 No   Family History  family history includes Alzheimer's disease in his father; Heart disease in his paternal aunt and sister; Hypertension in his father and mother.   Current Medications   Current Facility-Administered Medications:    0.9 %  sodium chloride infusion, 250 mL, Intravenous, PRN, Patwardhan, Manish J, MD   acetaminophen (TYLENOL) tablet 650 mg, 650 mg, Oral, Q4H PRN, Patwardhan, Manish J, MD, 650 mg at 07/09/22 2111   amLODipine (NORVASC) tablet 10 mg, 10 mg, Oral, q AM, Patwardhan, Manish J, MD, 10 mg at 07/10/22 0600   aspirin chewable tablet 81 mg, 81 mg, Oral, Q1200, Patwardhan, Manish J, MD, 81 mg at 07/09/22 1217   atorvastatin (LIPITOR) tablet 80 mg, 80 mg, Oral, QHS, Rosalin Hawking, MD, 80 mg at 07/09/22 2111   clopidogrel (PLAVIX) tablet 75 mg, 75 mg,  Oral, Q breakfast, Patwardhan, Manish J, MD, 75 mg at 07/10/22 0953   colchicine tablet 0.6 mg, 0.6 mg, Oral, BID, Patwardhan, Manish J, MD, 0.6 mg at 07/10/22 0953   DULoxetine (CYMBALTA) DR capsule 60 mg, 60 mg, Oral, QPM, Patwardhan, Manish J, MD, 60 mg at 07/09/22 1744   empagliflozin (JARDIANCE) tablet 10 mg, 10 mg, Oral, QAC breakfast, Patwardhan, Manish J, MD, 10 mg at 07/10/22 0953   ezetimibe (ZETIA) tablet 10 mg, 10 mg, Oral, Daily, Patwardhan, Manish J, MD, 10 mg at 07/10/22 0953   insulin aspart (novoLOG) injection 0-15 Units, 0-15 Units, Subcutaneous, TID WC, Patwardhan, Manish J, MD, 2 Units at 07/10/22 0845   isosorbide mononitrate (IMDUR) 24 hr tablet 30 mg, 30 mg, Oral, Daily, Patwardhan, Manish J, MD, 30 mg at 07/10/22 0953   methadone (DOLOPHINE) tablet 5 mg, 5 mg, Oral, QODAY, Patwardhan, Manish J, MD, 5 mg at 07/10/22 0953   metoprolol succinate (TOPROL-XL) 24 hr tablet 25 mg, 25 mg, Oral, Daily, Patwardhan, Manish J, MD, 25 mg at 07/10/22 0953   mirabegron ER (MYRBETRIQ) tablet 25 mg, 25 mg, Oral, Daily, Patwardhan, Manish J, MD, 25 mg at 07/10/22 0953   nitroGLYCERIN 50 mg in dextrose 5 % 250 mL (0.2 mg/mL) infusion, 2-200 mcg/min, Intravenous, Titrated, Patwardhan, Manish J, MD   ondansetron (ZOFRAN) injection 4 mg, 4 mg, Intravenous, Q6H PRN, Patwardhan, Manish J, MD   oxyCODONE (Oxy IR/ROXICODONE) immediate release tablet 5 mg, 5 mg, Oral, QID PRN, Patwardhan, Manish J, MD   sodium chloride flush (NS) 0.9 % injection 3 mL, 3 mL, Intravenous, Q12H, Patwardhan, Manish J, MD, 3 mL at 07/10/22 0954   sodium chloride flush (NS) 0.9 % injection 3 mL, 3 mL, Intravenous, PRN, Patwardhan, Manish J, MD   tamsulosin (FLOMAX) capsule 0.4 mg, 0.4 mg, Oral, QHS, Patwardhan, Manish J, MD, 0.4 mg at 07/09/22 2111   tiZANidine (ZANAFLEX) tablet 4 mg, 4 mg, Oral, QHS, Patwardhan, Manish J, MD, 4 mg at 07/09/22 2111   Patients Current Diet:  Diet Order                  Diet - low sodium  heart healthy             Diet 2 gram sodium Room service appropriate? Yes; Fluid consistency: Thin  Diet effective now             Diet - low sodium heart healthy                         Precautions / Restrictions Precautions Precautions: Fall Precaution Comments: L hemi Restrictions Weight Bearing Restrictions: No  Has the patient had 2 or more falls or a fall with injury in the past year? Yes   Prior Activity Level Community (5-7x/wk): drives, gets out of house 3-4 days/week   Prior Functional Level Self Care: Did the patient need help bathing, dressing, using the toilet or eating? Independent   Indoor Mobility: Did the patient need assistance with walking from room to room (with or without device)? Independent   Stairs: Did the patient need assistance with internal or external stairs (with or without device)? Independent   Functional Cognition: Did the patient need help planning regular tasks such as shopping or remembering to take medications? Independent   Patient Information Are you of Hispanic, Latino/a,or Spanish origin?: A. No, not of Hispanic, Latino/a, or Spanish origin What is your race?: A. White Do you need or want an interpreter to communicate with a doctor or health care staff?: 0. No   Patient's Response To:  Health Literacy and Transportation Is the patient able to respond to health literacy and transportation needs?: Yes Health Literacy - How often do you need to have someone help you when you read instructions, pamphlets, or other written material from your doctor or pharmacy?: Sometimes In the past 12 months, has lack of transportation kept you from medical appointments or from getting medications?: No In the past 12 months, has lack of transportation kept you from meetings, work, or from getting things needed for daily living?: No   Home Assistive Devices / [place] Devices/Equipment: Radio producer (specify quad or straight) Home  Equipment: Conservation officer, nature (2 wheels), [employer] - single point, BSC/3in1, Tub bench, Grab bars - toilet, Grab bars - tub/shower, Wheelchair - manual, Transport planner   Prior Device Use: Indicate devices/aids used by the patient prior to current illness, exacerbation or injury?  cane   Current Functional Level Cognition   Arousal/Alertness: Awake/alert Overall Cognitive Status: No family/caregiver present to determine baseline cognitive functioning Orientation Level: Oriented X4 Following Commands: Follows one step commands consistently Safety/Judgement: Decreased awareness of deficits, Decreased awareness of safety General Comments: Pt follows commands well throughout session, appreciative of education & session. Attention: Sustained, Focused Focused Attention: Appears intact Sustained Attention: Impaired Sustained Attention Impairment: Verbal basic, Verbal complex Memory: Impaired Memory Impairment: Decreased recall of new information, Decreased short term memory Decreased Short Term Memory: Verbal basic Awareness: Impaired Awareness Impairment: Emergent impairment Problem Solving: Impaired Problem Solving Impairment: Verbal complex, Functional complex    Extremity Assessment (includes Sensation/Coordination)   Upper Extremity Assessment: LUE deficits/detail RUE Deficits / Details: overall WFL LUE Deficits / Details: unable to hold shoulder flexion against gravity, poor FM coordination, paraesthesia noted throughout arm and no respose to noxious stim on digits wtih eyes closed. significantly impaired sensory motor LUE Sensation: decreased light touch, decreased proprioception LUE Coordination: decreased fine motor, decreased gross motor  Lower Extremity Assessment: LLE deficits/detail, RLE deficits/detail RLE Deficits / Details: ROM WFL; MMT 5/5 throughout LLE Deficits / Details: ROM WFL; MMT : ankle DF 4/5, knee ext 4/5, hip flexion 3/5     ADLs   Overall ADL's : Needs  assistance/impaired Eating/Feeding: Set up, Sitting Grooming: Set up, Sitting, Minimal assistance Grooming Details (indicate cue type and reason): min A for sitting balance Upper Body Bathing: Minimal assistance, Sitting Lower Body Bathing: Maximal assistance, Sit to/from stand Upper Body Dressing : Minimal assistance, Sitting Lower Body Dressing: Maximal assistance, Sit to/from stand Toilet Transfer: Moderate assistance, +2 for physical assistance, +2 for safety/equipment, Ambulation Toilet Transfer Details (indicate cue type  and reason): L knee buckle Toileting- Clothing Manipulation and Hygiene: Maximal assistance, Sit to/from stand Functional mobility during ADLs: Moderate assistance, +2 for physical assistance, +2 for safety/equipment General ADL Comments: limited by poor LUE sensory motor, L knee buckling, impaired balance with posterior and L lateral lean and decreased activity tolerance     Mobility   Overal bed mobility: Needs Assistance Bed Mobility: Supine to Sit Supine to sit: Mod assist Sit to supine: Min assist General bed mobility comments: pt received on BSC, left in recliner     Transfers   Overall transfer level: Needs assistance Equipment used: Rolling walker (2 wheels) Transfers: Sit to/from Stand Sit to Stand: Mod assist (STS from [facility] & EOB with RW & cuing for hand placement to push to standing with 1UE.) Bed to/from chair/wheelchair/BSC transfer type:: Step pivot Step pivot transfers: Mod assist (Pt transfers bed>recliner on L with RW & mod assist with max cuing. PT provides manual facilitation for weight shifting L<>R, cuing for midline orientation, cuing for stepping pattern.)  Lateral/Scoot Transfers: Mod assist General transfer comment: lateral scoot transfer bed to recliner (with swing away armrest). Transferred toward R. Cues for sequencing. Increased time     Ambulation / Gait / Stairs / Wheelchair Mobility   Ambulation/Gait Ambulation/Gait assistance:  Mod assist, Max assist Gait Distance (Feet): 6 Feet Assistive device: Rolling walker (2 wheels) Gait Pattern/deviations: Step-to pattern, Decreased step length - right, Decreased dorsiflexion - right, Decreased step length - left, Decreased dorsiflexion - left, Decreased stride length General Gait Details: Pt ambulates with RW & mod increasing to max assist 2/2 L lateral lean with decreased ability to correct with fatigue. Pt requires cuing for stepping pattern, increased step length RLE, cuing & manual facilitation for weight shifting R to allow increased ease of LLE foot clearance, cuing for upright posture vs forward lean. Pt with difficulty maintaining grasp on RW with LUE. Gait velocity: decreased     Posture / Balance Dynamic Sitting Balance Sitting balance - Comments: supervision static sitting EOB, L lateral lean Balance Overall balance assessment: Needs assistance Sitting-balance support: Feet supported, Single extremity supported Sitting balance-Leahy Scale: Fair Sitting balance - Comments: supervision static sitting EOB, L lateral lean Standing balance support: Bilateral upper extremity supported, During functional activity, Reliant on assistive device for balance Standing balance-Leahy Scale: Poor Standing balance comment: BUE support on RW in standing while PT performs dependent assist peri hygiene.     Special needs/care consideration Diabetic management novoLOG 0-15 units 3x daily with meals, External Urinary Catheter    Previous Home Environment (from acute therapy documentation) Living Arrangements: Spouse/significant other  Lives With: Spouse Available Help at Discharge: Family, Available 24 hours/day Type of Home: House Home Layout: One level Home Access: Level entry, Elevator Bathroom Shower/Tub: Chiropodist: Handicapped height Bathroom Accessibility: Yes How Accessible: Accessible via walker Butler: No   Discharge Living  Setting Plans for Discharge Living Setting: Patient's home Type of Home at Discharge: House Discharge Home Layout: One level Discharge Home Access: Level entry Discharge Bathroom Shower/Tub: Tub/shower unit Discharge Bathroom Toilet: Handicapped height Discharge Bathroom Accessibility: Yes How Accessible: Accessible via walker Does the patient have any problems obtaining your medications?: No   Social/Family/Support Systems Anticipated Caregiver: Fransisco Messmer, wife Anticipated Caregiver's Contact Information: [phone] Caregiver Availability: 24/7 Discharge Plan Discussed with Primary Caregiver: Yes Is Caregiver In Agreement with Plan?: Yes Does Caregiver/Family have Issues with Lodging/Transportation while Pt is in Rehab?: No   Goals Patient/Family Goal for Rehab:  Supervision-Min A: PT/OT, Supervision: ST Expected length of stay: 12-14 days Pt/Family Agrees to Admission and willing to participate: Yes Program Orientation Provided & Reviewed with Pt/Caregiver Including Roles  & Responsibilities: Yes   Decrease burden of Care through IP rehab admission: NA   Possible need for SNF placement upon discharge: Not anticipated   Patient Condition: I have reviewed medical records from [facility], spoken with CSW, and patient and spouse. I met with patient at the bedside for inpatient rehabilitation assessment.  Patient will benefit from ongoing PT, OT, and SLP, can actively participate in 3 hours of therapy a day 5 days of the week, and can make measurable gains during the admission.  Patient will also benefit from the coordinated team approach during an Inpatient Acute Rehabilitation admission.  The patient will receive intensive therapy as well as Rehabilitation physician, nursing, social worker, and care management interventions.  Due to bladder management, safety, disease management, medication administration, pain management, and patient education the patient requires 24 hour a  day rehabilitation nursing.  The patient is currently Mod A with mobility Min-Max A with and basic ADLs.  Discharge setting and therapy post discharge at home with home health is anticipated.  Patient has agreed to participate in the Acute Inpatient Rehabilitation Program and will admit today.   Preadmission Screen Completed By:  Bethel Born, 07/10/2022 11:58 AM ______________________________________________________________________   Discussed status with Dr. Naaman Plummer on 07/10/22  at 11:58 AM and received approval for admission today.   Admission Coordinator:  Bethel Born, CCC-SLP, time 11:58 AM/Date 07/10/22     Assessment/Plan: Diagnosis: embolic CVA Does the need for close, 24 hr/day Medical supervision in concert with the patient's rehab needs make it unreasonable for this patient to be served in a less intensive setting? Yes Co-Morbidities requiring supervision/potential complications: HTN, CAD, chr LBP with radic Due to bladder management, bowel management, safety, skin/wound care, disease management, medication administration, pain management, and patient education, does the patient require 24 hr/day rehab nursing? Yes Does the patient require coordinated care of a physician, rehab nurse, PT, OT,     to address physical and functional deficits in the context of the above medical diagnosis(es)? Yes Addressing deficits in the following areas: balance, endurance, locomotion, strength, transferring, bowel/bladder control, bathing, dressing, feeding, grooming, toileting, and psychosocial support Can the patient actively participate in an intensive therapy program of at least 3 hrs of therapy 5 days a week? Yes The potential for patient to make measurable gains while on inpatient rehab is excellent Anticipated functional outcomes upon discharge from inpatient rehab: min assist PT, min assist OT, n/a SLP Estimated rehab length of stay to reach the above functional goals is:  12-14 days Anticipated discharge destination: Home 10. Overall Rehab/Functional Prognosis: excellent     MD Signature: Meredith Staggers, MD, [place] Director Rehabilitation Services 07/10/2022

## 2022-07-10 NOTE — Progress Notes (Signed)
Physical Therapy Treatment Patient Details Name: Thomas [name] MRN: [id number] DOB: 12/11/1945 Today's Date: 07/10/2022   History of Present Illness Thomas [name]  is a 76 y.o. male who presented for ABDOMINAL AORTOGRAM W/LOWER EXTREMITY, and LEFT HEART CATH AND CORONARY ANGIOGRAPHY on 10/31. Pt also noted to have L side weakness prompting neuro consult. MRI revealed multiple scattered acute ischemic infarcts involving the cortical  and subcortical aspects of both cerebral hemispheres and cerebellum.  PMH:  hypertension, newly reduced ejection fraction, unstable angina, diabetes, OA of knee, HLD    PT Comments    Pt seen for PT tx with pt received on BSC, pt agreeable to tx. Pt completes STS from multiple surfaces throughout session with mod assist with cuing re: hand placement & safety with RW during transfers. Pt engaged in STS with focus on strengthening & LLE NMR and progressed to walking short distance in room with mod increasing to max assist 2/2 fatigue with pt using RW. Pt presents with L lateral lean & decreased weight shifting to R, impaired midline orientation & LUE/LLE weakness. Pt is appreciative of PT session & eager to return to PLOF. Continue to recommend CIR upon d/c.     Recommendations for follow up therapy are one component of a multi-disciplinary discharge planning process, led by the attending physician.  Recommendations may be updated based on patient status, additional functional criteria and insurance authorization.  Follow Up Recommendations  Acute inpatient rehab (3hours/day)     Assistance Recommended at Discharge Frequent or constant Supervision/Assistance  Patient can return home with the following A lot of help with walking and/or transfers;A lot of help with bathing/dressing/bathroom;Assist for transportation;Help with stairs or ramp for entrance;Assistance with cooking/housework   Equipment Recommendations  None recommended by PT (TBD in next venue)     Recommendations for Other Services Rehab consult     Precautions / Restrictions Precautions Precautions: Fall Precaution Comments: L hemi Restrictions Weight Bearing Restrictions: No     Mobility  Bed Mobility               General bed mobility comments: pt received on BSC, left in recliner    Transfers Overall transfer level: Needs assistance Equipment used: Rolling walker (2 wheels) Transfers: Sit to/from Stand Sit to Stand: Mod assist (STS from [facility] & EOB with RW & cuing for hand placement to push to standing with 1UE.)   Step pivot transfers: Mod assist (Pt transfers bed>recliner on L with RW & mod assist with max cuing. PT provides manual facilitation for weight shifting L<>R, cuing for midline orientation, cuing for stepping pattern.)            Ambulation/Gait Ambulation/Gait assistance: Mod assist, Max assist Gait Distance (Feet): 6 Feet Assistive device: Rolling walker (2 wheels) Gait Pattern/deviations: Step-to pattern, Decreased step length - right, Decreased dorsiflexion - right, Decreased step length - left, Decreased dorsiflexion - left, Decreased stride length Gait velocity: decreased     General Gait Details: Pt ambulates with RW & mod increasing to max assist 2/2 L lateral lean with decreased ability to correct with fatigue. Pt requires cuing for stepping pattern, increased step length RLE, cuing & manual facilitation for weight shifting R to allow increased ease of LLE foot clearance, cuing for upright posture vs forward lean. Pt with difficulty maintaining grasp on RW with LUE.   Stairs             Wheelchair Mobility    Modified Rankin (Stroke Patients Only)  Balance   Sitting-balance support: Feet supported, Single extremity supported Sitting balance-Leahy Scale: Fair Sitting balance - Comments: supervision static sitting EOB, L lateral lean   Standing balance support: Bilateral upper extremity supported, During functional  activity, Reliant on assistive device for balance Standing balance-Leahy Scale: Poor Standing balance comment: BUE support on RW in standing while PT performs dependent assist peri hygiene.                            Cognition Arousal/Alertness: Awake/alert Behavior During Therapy: WFL for tasks assessed/performed Overall Cognitive Status: No family/caregiver present to determine baseline cognitive functioning                         Following Commands: Follows one step commands consistently Safety/Judgement: Decreased awareness of deficits, Decreased awareness of safety Awareness: Anticipatory, Emergent   General Comments: Pt follows commands well throughout session, appreciative of education & session.        Exercises Other Exercises Other Exercises: Pt engages in 5x STS from EOB without BUE support with mod increasing to max assist with fatigue with focus on general & BLE strengthening, LLE NMR, and activity tolerance. Pt demonstrates posterior lean onto EOB with BLE. Requires cuing for upright posture once in standing, terminal L knee extension, midline orietnation. Other Exercises: Pt engaged in reaching outside of BOS with BUE to L & R with focus on sitting balance & core/trunk strengthening & return to midline. Pt with decreased LUE shoulder flexion (L worse than RUE).    General Comments General comments (skin integrity, edema, etc.): Pt with continent BM on BSC, requires dependent assist for peri hygiene. PT educated pt on stroke & stroke recovery & CIR. Encouraged pt to incorporate LUE into as many functional tasks as possible.      Pertinent Vitals/Pain Pain Assessment Pain Assessment: Faces Faces Pain Scale: Hurts little more Pain Location: LLE soreness, increased with touch Pain Descriptors / Indicators: Sore Pain Intervention(s): Repositioned, Limited activity within patient's tolerance, Monitored during session    Home Living                           Prior Function            PT Goals (current goals can now be found in the care plan section) Acute Rehab PT Goals Patient Stated Goal: return home PT Goal Formulation: With patient/family Time For Goal Achievement: 07/22/22 Potential to Achieve Goals: Good Progress towards PT goals: Progressing toward goals    Frequency    Min 4X/week      PT Plan Current plan remains appropriate    Co-evaluation              AM-PAC PT "6 Clicks" Mobility   Outcome Measure  Help needed turning from your back to your side while in a flat bed without using bedrails?: A Little Help needed moving from lying on your back to sitting on the side of a flat bed without using bedrails?: A Little Help needed moving to and from a bed to a chair (including a wheelchair)?: A Lot Help needed standing up from a chair using your arms (e.g., wheelchair or bedside chair)?: A Lot Help needed to walk in hospital room?: A Lot Help needed climbing 3-5 steps with a railing? : Total 6 Click Score: 13    End of Session Equipment Utilized During Treatment: Gait  belt Activity Tolerance: Patient tolerated treatment well Patient left: in chair;with chair alarm set;with call bell/phone within reach Nurse Communication: Mobility status PT Visit Diagnosis: Muscle weakness (generalized) (M62.81);Other abnormalities of gait and mobility (R26.89);Hemiplegia and hemiparesis;Unsteadiness on feet (R26.81);Difficulty in walking, not elsewhere classified (R26.2) Hemiplegia - Right/Left: Left Hemiplegia - dominant/non-dominant: Non-dominant Hemiplegia - caused by: Cerebral infarction     Time: 0017-4944 PT Time Calculation (min) (ACUTE ONLY): 34 min  Charges:  $Therapeutic Activity: 8-22 mins $Neuromuscular Re-education: 8-22 mins                     Lavone Nian, PT, DPT 07/10/22, 11:28 AM   Waunita Schooner 07/10/2022, 11:27 AM

## 2022-07-10 NOTE — Progress Notes (Signed)
Inpatient Rehabilitation Admission Medication Review by a Pharmacist  A complete drug regimen review was completed for this patient to identify any potential clinically significant medication issues.  High Risk Drug Classes Is patient taking? Indication by Medication  Antipsychotic Yes Compazine - prn N/V  Anticoagulant Yes Lovenox - VTE ppx  Antibiotic No   Opioid Yes Methadone, prn oxycodone - pain  Antiplatelet Yes Plavix, ASA- CAD s/p stent, CVA  Hypoglycemics/insulin Yes SSI, Jardiance - DM  Vasoactive Medication Yes Amlodipine, metoprolol, Imdur - CAD, BP  Flomax- BPH, OAB  Chemotherapy No   Other Yes Robaxin - prn spasms Trazodone - prn sleep Atorvastatin, Zetia - HLD Cymbalta - mood Myrbetriq - OAB Zanaflex - muscle spasms Colchicine - gout     Type of Medication Issue Identified Description of Issue Recommendation(s)  Drug Interaction(s) (clinically significant)     Duplicate Therapy     Allergy     No Medication Administration End Date     Incorrect Dose     Additional Drug Therapy Needed     Significant med changes from prior encounter (inform family/care partners about these prior to discharge). DM medications changed - glipizide, metformin not yet resumed  Furosemide, Entresto - currently on hold Myrbetriq replaced Vibergon  Omeprazole not yet resumed Reconcile at discharge  Other       Clinically significant medication issues were identified that warrant physician communication and completion of prescribed/recommended actions by midnight of the next day:  No   Pharmacist comments: None  Time spent performing this drug regimen review (minutes):  30 minutes  Thank you Anette Guarneri, PharmD

## 2022-07-11 DIAGNOSIS — I639 Cerebral infarction, unspecified: Secondary | ICD-10-CM | POA: Diagnosis not present

## 2022-07-11 DIAGNOSIS — E1169 Type 2 diabetes mellitus with other specified complication: Secondary | ICD-10-CM | POA: Diagnosis not present

## 2022-07-11 DIAGNOSIS — I739 Peripheral vascular disease, unspecified: Secondary | ICD-10-CM | POA: Diagnosis not present

## 2022-07-11 DIAGNOSIS — M5441 Lumbago with sciatica, right side: Secondary | ICD-10-CM | POA: Diagnosis not present

## 2022-07-11 LAB — GLUCOSE, CAPILLARY
Glucose-Capillary: 151 mg/dL — ABNORMAL HIGH (ref 70–99)
Glucose-Capillary: 153 mg/dL — ABNORMAL HIGH (ref 70–99)
Glucose-Capillary: 262 mg/dL — ABNORMAL HIGH (ref 70–99)
Glucose-Capillary: 165 mg/dL — ABNORMAL HIGH (ref 70–99)

## 2022-07-11 MED ORDER — ORAL CARE MOUTH RINSE: 15.0000 mL | OROMUCOSAL | Status: DC | PRN

## 2022-07-11 NOTE — Evaluation (Signed)
Physical Therapy Assessment and Plan  Patient Details  Name: Thomas [name] MRN: [id number] Date of Birth: [birth year]  PT Diagnosis: Abnormality of gait, Cognitive deficits, Coordination disorder, Difficulty walking, Hemiplegia non-dominant, Impaired cognition, Impaired sensation, Muscle weakness, and Pain in R knee Rehab Potential: Good ELOS: 10-14   Today's Date: 07/11/2022 PT Individual Time: 9233-0076 PT Individual Time Calculation (min): 75 min    Hospital Problem: Principal Problem:   Acute ischemic stroke [facility])   Past Medical History:  Past Medical History:  Diagnosis Date   Actinic keratosis 05/12/2017   Back pain    Chronic systolic (congestive) heart failure ([place]) 05/06/2022   Depression 05/12/2017   Diabetes ([place])    GERD (gastroesophageal reflux disease) 05/12/2017   Hx of colonic polyps 05/12/2017   Hyperlipidemia 05/12/2017   Hypertension    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/2017   Low back pain 05/12/2017   Lumbar radiculopathy 05/12/2017   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   Obesity 05/12/2017   Osteoarthritis of knee 05/12/2017   Paresthesia 05/12/2017   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   Prostate nodule 05/12/2017   Right rotator cuff tear 10/01/2021   Seborrheic dermatitis 05/12/2017   Past Surgical History:  Past Surgical History:  Procedure Laterality Date   ABDOMINAL AORTOGRAM W/LOWER EXTREMITY N/A 07/07/2022   Procedure: ABDOMINAL AORTOGRAM W/LOWER EXTREMITY;  Surgeon: Nigel Mormon, MD;  Location: [place] [facility];  Service: Cardiovascular;  Laterality: N/A;   CORONARY ATHERECTOMY N/A 07/07/2022   Procedure: CORONARY ATHERECTOMY;  Surgeon: Nigel Mormon, MD;  Location: [place] [facility];  Service: Cardiovascular;  Laterality: N/A;   CORONARY STENT INTERVENTION N/A 07/07/2022   Procedure: CORONARY STENT INTERVENTION;  Surgeon: Nigel Mormon, MD;  Location: [place] [facility];  Service: Cardiovascular;  Laterality: N/A;   HAND SURGERY Right    INTRAVASCULAR  PRESSURE WIRE/FFR STUDY N/A 07/07/2022   Procedure: INTRAVASCULAR PRESSURE WIRE/FFR STUDY;  Surgeon: Nigel Mormon, MD;  Location: [place] [facility];  Service: Cardiovascular;  Laterality: N/A;   LEFT HEART CATH AND CORONARY ANGIOGRAPHY N/A 07/07/2022   Procedure: LEFT HEART CATH AND CORONARY ANGIOGRAPHY;  Surgeon: Nigel Mormon, MD;  Location: [place] [facility];  Service: Cardiovascular;  Laterality: N/A;   ORIF ANKLE FRACTURE Left 08/15/2007   Distal fibular   REPLACEMENT TOTAL KNEE Right 07/12/2012   RIGHT/LEFT HEART CATH AND CORONARY ANGIOGRAPHY N/A 01/12/2018   Procedure: RIGHT/LEFT HEART CATH AND CORONARY ANGIOGRAPHY;  Surgeon: Belva Crome, MD;  Location: [place] [facility];  Service: Cardiovascular;  Laterality: N/A;   RIGHT/LEFT HEART CATH AND CORONARY ANGIOGRAPHY N/A 05/19/2022   Procedure: RIGHT/LEFT HEART CATH AND CORONARY ANGIOGRAPHY;  Surgeon: Nigel Mormon, MD;  Location: [place] [facility];  Service: Cardiovascular;  Laterality: N/A;   SHOULDER SURGERY Right    UMBILICAL HERNIA REPAIR  06/23/2013    Assessment & Plan Clinical Impression: Patient is a 76 year old male with a history of coronary artery disease.  On 07/01/2022 by Dr. Marina Goodell and arrangements made for left heart cath with intervention.  He presented to the Cath Lab on 10/31 and underwent PCI with drug-eluting stents to the LAD.  Recommendations are for Plavix and aspirin daily for minimum of 6 months.  He was admitted for observation.  On 11/1, he complained of left hand numbness and weakness.  He reported left hand numbness for approximately 3 days.  Rapid response team was notified and stat CT of the head was performed.  RI of the brain showed bilateral  embolic shower, right greater than left.  MRA of the head with bilateral M2 and P2 stenoses, left sci-fi and atherosclerosis.  Carotid Doppler unremarkable.  On exam, he had mild left facial droop left upper extremity and left proximal lower  extremity weakness.  Neurology for infarct most likely related to cardiac procedure although other cardioembolic sources cannot be completely ruled out.  Echo with ejection fraction 50 to 55%.  Recommend 30-day cardiac event monitoring as outpatient.  He is tolerating 2 g sodium diet.The patient requires inpatient physical medicine and rehabilitation evaluations and treatment secondary to dysfunction due to acute ischemic infarcts.   Patient currently requires mod with mobility secondary to muscle weakness and muscle joint tightness, decreased cardiorespiratoy endurance, impaired timing and sequencing, motor apraxia, decreased coordination, and decreased motor planning, decreased midline orientation and decreased motor planning, decreased awareness, decreased problem solving, decreased safety awareness, and decreased memory, and decreased sitting balance, decreased standing balance, hemiplegia, and decreased balance strategies.  Prior to hospitalization, patient was modified independent  with mobility and lived with Spouse in a House home.  Home access is  Level entry (Threshold step to enter).  Patient will benefit from skilled PT intervention to maximize safe functional mobility, minimize fall risk, and decrease caregiver burden for planned discharge home with 24 hour supervision.  Anticipate patient will benefit from follow up [place] at discharge.  PT - End of Session Activity Tolerance: Tolerates 30+ min activity with multiple rests Endurance Deficit: Yes Endurance Deficit Description: Global deconditioning; Poor endurance/activity tolerance with poor frustration tolerance PT Assessment Rehab Potential (ACUTE/IP ONLY): Good PT Barriers to Discharge: Decreased caregiver support;Insurance for SNF coverage;Behavior PT Patient demonstrates impairments in the following area(s): Balance;Perception;Behavior;Safety;Sensory;Endurance;Motor;Pain PT Transfers Functional Problem(s): Bed Mobility;Bed to  Chair;Car PT Locomotion Functional Problem(s): Ambulation;Wheelchair Mobility;Stairs PT Plan PT Intensity: Minimum of 1-2 x/day ,45 to 90 minutes PT Frequency: 5 out of 7 days PT Duration Estimated Length of Stay: 10-14 PT Treatment/Interventions: Ambulation/gait training;Community reintegration;DME/adaptive equipment instruction;Neuromuscular re-education;Psychosocial support;Stair training;UE/LE Strength taining/ROM;Wheelchair propulsion/positioning;Balance/vestibular training;Discharge planning;Functional electrical stimulation;Pain management;Skin care/wound management;Therapeutic Activities;UE/LE Coordination activities;Cognitive remediation/compensation;Disease management/prevention;Functional mobility training;Patient/family education;Splinting/orthotics;Therapeutic Exercise;Visual/perceptual remediation/compensation PT Transfers Anticipated Outcome(s): Supv with LRAD PT Locomotion Anticipated Outcome(s): Supv for household distances with LRAD PT Recommendation Recommendations for Other Services: Neuropsych consult (Patient with poor frustration tolerance secondary to functional limitations and decreased independence) Follow Up Recommendations: Home health PT Patient destination: Home Equipment Recommended: To be determined Equipment Details: TBD   PT Evaluation Precautions/Restrictions Precautions Precautions: Fall Precaution Comments: L hemi Restrictions Weight Bearing Restrictions: No Pain Interference Pain Interference Pain Effect on Sleep: 8. Unable to answer;4. Almost constantly (Patient is unable to follow the question and provide an appropriate response at this time, however he does state that pain does wake him up at night.) Pain Interference with Therapy Activities: 1. Rarely or not at all Pain Interference with Day-to-Day Activities: 2. Occasionally Home Living/Prior Functioning Home Living Available Help at Discharge: Family;Available 24 hours/day (Spouse reports she  is able to provide supervision, however she is unable to lift him as she is ambulating with a walker herself) Type of Home: House Home Access: Level entry (Threshold step to enter) Home Layout: One level Bathroom Shower/Tub: Chiropodist: Handicapped height Bathroom Accessibility: Yes Additional Comments: patient indicated that he drives and desires to return to prior LOF  Lives With: Spouse Prior Function Level of Independence: Independent with basic ADLs;Independent with gait;Independent with transfers  Able to Take Stairs?: Yes Driving: Yes Vocation: Retired Biomedical scientist: Company secretary,  electrician, 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 [employer] Leisure: Hobbies-yes (Comment) (Enjoys making Eagles for [employer]) Vision/Perception  Vision - History Ability to See in Adequate Light: 0 Adequate Perception Perception: Within Functional Limits Praxis Praxis: Intact  Cognition Overall Cognitive Status: Within Functional Limits for tasks assessed Arousal/Alertness: Awake/alert Orientation Level: Oriented X4 Year: 2023 (Originally stated "1923" however able to correct with prompting) Month: November Day of Week: Correct Attention: Sustained;Focused Focused Attention: Appears intact Sustained Attention: Impaired Memory: Impaired Memory Impairment: Decreased recall of new information;Decreased short term memory Decreased Short Term Memory: Verbal basic Awareness: Impaired Problem Solving: Impaired Behaviors:  (frustration over funcitonal status) Safety/Judgment: Appears intact Comments: Spouse reports when patient initially came into the hospital, his cognition was impaired however currently seh states he is back to baseline. Sensation Sensation Light Touch: Impaired by gross assessment Additional Comments: L LE is more dull than R LE (below the knee) Coordination Gross Motor Movements are Fluid and Coordinated: No Fine Motor Movements  are Fluid and Coordinated: No Coordination and Movement Description: Impaired coordination, L sided weakness and deconditioning. Patient underwent R knee surgery, however reports "the knee was never cleaned out before the surgery" and it causes him pain. Finger Nose Finger Test: Minor under and overshooting Heel Shin Test: Unable to perform with R LE secondary to knee pain; L LE unable to motor plan or coordinate movement, also weak Motor  Motor Motor: Motor apraxia;Hemiplegia Motor - Skilled Clinical Observations: L-sided weakness and impaired coordination   Trunk/Postural Assessment  Cervical Assessment Cervical Assessment: Within Functional Limits Thoracic Assessment Thoracic Assessment: Exceptions to [facility] (Rounded shoulders) Lumbar Assessment Lumbar Assessment: Exceptions to [facility] (Posterior pelvic tilt) Postural Control Postural Control: Deficits on evaluation Righting Reactions: Delayed Protective Responses: Delayed  Balance Balance Balance Assessed: Yes Static Sitting Balance Static Sitting - Balance Support: Feet supported Static Sitting - Level of Assistance: 5: Stand by assistance Dynamic Sitting Balance Dynamic Sitting - Balance Support: During functional activity Dynamic Sitting - Level of Assistance: 4: Min assist (CGA) Static Standing Balance Static Standing - Balance Support: Right upper extremity supported Static Standing - Level of Assistance: 4: Min assist Dynamic Standing Balance Dynamic Standing - Balance Support: Right upper extremity supported;During functional activity Dynamic Standing - Level of Assistance: 3: Mod assist Extremity Assessment    LUE Assessment Passive Range of Motion (PROM) Comments: WFL  mix tone Active Range of Motion (AROM) Comments: Limited to 80-90 degrees General Strength Comments: 3/5 MMT RLE Assessment RLE Assessment: Exceptions to [facility] General Strength Comments: Pain limiting ROM and strength- Grossly 3+/5 LLE Assessment LLE  Assessment: Exceptions to [facility] General Strength Comments: L LE strength assessed with functional mobility- Grossly 3/5  Care Tool Care Tool Bed Mobility Roll left and right activity   Roll left and right assist level: Minimal Assistance - Patient > 75%    Sit to lying activity   Sit to lying assist level: Contact Guard/Touching assist    Lying to sitting on side of bed activity   Lying to sitting on side of bed assist level: the ability to move from lying on the back to sitting on the side of the bed with no back support.: Minimal Assistance - Patient > 75%     Care Tool Transfers Sit to stand transfer   Sit to stand assist level: Moderate Assistance - Patient 50 - 74%    Chair/bed transfer   Chair/bed transfer assist level: Moderate Assistance - Patient 50 - 74%     Toilet  Teaching laboratory technician transfer assist level: Moderate Assistance - Patient 50 - 74%      Care Tool Locomotion Ambulation   Assist level: Moderate Assistance - Patient 50 - 74% Assistive device: Walker-hemi Max distance: 15'  Walk 10 feet activity   Assist level: Moderate Assistance - Patient - 50 - 74% Assistive device: Walker-hemi   Walk 50 feet with 2 turns activity Walk 50 feet with 2 turns activity did not occur: Safety/medical concerns (Patient unable to ambulate >15' and assess stair, curb, ramp mobility at this time secondary to reports of increased B knee pain, impaired endurance, decreased global strenght)      Walk 150 feet activity Walk 150 feet activity did not occur: Safety/medical concerns      Walk 10 feet on uneven surfaces activity Walk 10 feet on uneven surfaces activity did not occur: Safety/medical concerns      Stairs Stair activity did not occur: Safety/medical concerns        Walk up/down 1 step activity Walk up/down 1 step or curb (drop down) activity did not occur: Safety/medical concerns      Walk up/down 4 steps activity Walk up/down 4 steps activity did  not occur: Safety/medical concerns      Walk up/down 12 steps activity Walk up/down 12 steps activity did not occur: Safety/medical concerns      Pick up small objects from floor Pick up small object from the floor (from standing position) activity did not occur: Safety/medical concerns (Unable to assess without the intervention of adaptive equipment at this time secondary to L-sided weakness)      Wheelchair Is the patient using a wheelchair?: Yes Type of Wheelchair: Manual   Wheelchair assist level: Maximal Assistance - Patient 25 - 49% Max wheelchair distance: 150'  Wheel 50 feet with 2 turns activity   Assist Level: Moderate Assistance - Patient 50 - 74%  Wheel 150 feet activity   Assist Level: Maximal Assistance - Patient 25 - 49%    Refer to Care Plan for Long Term Goals  SHORT TERM GOAL WEEK 1 PT Short Term Goal 1 (Week 1): Patient will perform bed/chair transfer with LRAD and MinA PT Short Term Goal 2 (Week 1): Patient will ambulate 53' with LRAD and MinA PT Short Term Goal 3 (Week 1): Patient will initiate curb training for safety entry into home  Recommendations for other services: Neuropsych  Skilled Therapeutic Intervention Mobility Bed Mobility Bed Mobility: Rolling Right;Rolling Left;Supine to Sit;Sit to Supine Rolling Right: Minimal Assistance - Patient > 75% Rolling Left: Contact Guard/Touching assist Supine to Sit: Minimal Assistance - Patient > 75% Sit to Supine: Contact Guard/Touching assist Transfers Transfers: Sit to Stand;Stand to Sit;Stand Pivot Transfers Sit to Stand: Moderate Assistance - Patient 50-74% Stand to Sit: Moderate Assistance - Patient 50-74% Stand Pivot Transfers: Moderate Assistance - Patient 50 - 74% Stand Pivot Transfer Details: Verbal cues for technique;Verbal cues for gait pattern;Verbal cues for precautions/safety;Verbal cues for safe use of DME/AE Transfer (Assistive device): Hemi-walker Locomotion  Gait Ambulation: Yes Gait  Assistance: Moderate Assistance - Patient 50-74% Gait Distance (Feet): 15 Feet Assistive device: Hemi-walker;Rolling walker Gait Assistance Details: Verbal cues for technique;Verbal cues for gait pattern;Verbal cues for precautions/safety;Verbal cues for safe use of DME/AE;Verbal cues for sequencing;Tactile cues for weight shifting Gait Gait: Yes Gait Pattern: Step-to pattern;Decreased stride length;Decreased weight shift to right;Poor foot clearance - left Gait velocity: decreased Stairs / Additional Locomotion Stairs: No  Architect: Yes Wheelchair Assistance: Maximal Assistance - Patient 25 - 49% Wheelchair Propulsion: Both upper extremities;Both lower extermities Wheelchair Parts Management: Needs assistance Distance: 150'  Skilled Intervention- Patient greeted sitting upright in wheelchair in room with two NT and spouse present secondary to patient requesting to return to bed- Upon therapist arrival, patient agreeable to PT treatment session.   Patient performed car transfer with RW and [place]- Patient demonstrated impaired L hand grip strength leading to it falling of the walker and leading to moderate LOB during transfer requiring therapist assistance for steadying. Once seated in the car simulator, patient was able to place B LE into/out of the car with CGA at the trunk for improved safety. Patient propelled manual wheelchair with B UE and MaxA- VC for using R LE, however patient reporting increased pain in R knee and unable to coordinate/motor plan appropriate sequencing. Patient gait trained x15' with RW and ModA for improved stability and R TKE during stance phase secondary to buckling. Pre-gait performed with HW with mirror placed in front for improved midline posturing and sequencing with new AD. While standing with the [facility], The patient performed fwd/bkwd stepping with L LE with VC for increased R lateral weight shift into HW. Patient then trial gait x10 with  hemi-walker and ModA- Patient with improved gait mechanics, however fatigued quickly and required a seated rest break with reports of increased L knee pain. Patient returned to his room and performed transfer from wheelchair to sitting EOB with HW and MinA- Improved sequencing noted throughout transfer with no L knee buckling, however did not seem that patient was putting much weight into his L LE throughout transfer. Patient transitioned from sitting EOB to supine with CGA for safety. Patient left supine in bed with call bell within reach, spouse present, bed alarm on and all needs met.   Discharge Criteria: Patient will be discharged from PT if patient refuses treatment 3 consecutive times without medical reason, if treatment goals not met, if there is a change in medical status, if patient makes no progress towards goals or if patient is discharged from hospital.  The above assessment, treatment plan, treatment alternatives and goals were discussed and mutually agreed upon: by patient and by family  Dominica   07/11/2022, 4:03 PM

## 2022-07-11 NOTE — Plan of Care (Signed)
  Problem: Sit to Stand Goal: LTG:  Patient will perform sit to stand with assistance level (PT) Description: LTG:  Patient will perform sit to stand with assistance level (PT) Flowsheets (Taken 07/11/2022 1612) LTG: PT will perform sit to stand in preparation for functional mobility with assistance level: Supervision/Verbal cueing   Problem: RH Bed Mobility Goal: LTG Patient will perform bed mobility with assist (PT) Description: LTG: Patient will perform bed mobility with assistance, with/without cues (PT). Flowsheets (Taken 07/11/2022 1612) LTG: Pt will perform bed mobility with assistance level of: Independent with assistive device    Problem: RH Bed to Chair Transfers Goal: LTG Patient will perform bed/chair transfers w/assist (PT) Description: LTG: Patient will perform bed to chair transfers with assistance (PT). Flowsheets (Taken 07/11/2022 1612) LTG: Pt will perform Bed to Chair Transfers with assistance level: Supervision/Verbal cueing   Problem: RH Car Transfers Goal: LTG Patient will perform car transfers with assist (PT) Description: LTG: Patient will perform car transfers with assistance (PT). Flowsheets (Taken 07/11/2022 1612) LTG: Pt will perform car transfers with assist:: Supervision/Verbal cueing   Problem: RH Ambulation Goal: LTG Patient will ambulate in home environment (PT) Description: LTG: Patient will ambulate in home environment, # of feet with assistance (PT). Flowsheets (Taken 07/11/2022 1612) LTG: Pt will ambulate in home environ  assist needed:: Supervision/Verbal cueing LTG: Ambulation distance in home environment: 50' Goal: LTG Patient will ambulate in community environment (PT) Description: LTG: Patient will ambulate in community environment, # of feet with assistance (PT). Flowsheets (Taken 07/11/2022 1612) LTG: Pt will ambulate in community environ  assist needed:: Supervision/Verbal cueing LTG: Ambulation distance in community environment: 150'    Problem: RH Wheelchair Mobility Goal: LTG Patient will propel w/c in community environment (PT) Description: LTG: Patient will propel wheelchair in community environment, # of feet with assist (PT) Flowsheets (Taken 07/11/2022 1612) LTG: Pt will propel w/c in community environ  assist needed:: Independent with assistive device Distance: wheelchair distance in controlled environment: 150   Problem: RH Stairs Goal: LTG Patient will ambulate up and down stairs w/assist (PT) Description: LTG: Patient will ambulate up and down # of stairs with assistance (PT) Flowsheets (Taken 07/11/2022 1612) LTG: Pt will ambulate up/down stairs assist needed:: Supervision/Verbal cueing LTG: Pt will  ambulate up and down number of stairs: 1 threshold step to enter his home safely

## 2022-07-11 NOTE — Progress Notes (Signed)
PROGRESS NOTE   Subjective/Complaints: Felt a little anxious last night without wife in room. Doing ok this morning. Hoping she'll be in soon.   ROS: Patient denies fever, rash, sore throat, blurred vision, dizziness, nausea, vomiting, diarrhea, cough, shortness of breath or chest pain, joint or back/neck pain, headache     Objective:   No results found. Recent Labs    07/09/22 0230  WBC 8.0  HGB 12.4*  HCT 36.5*  PLT 223   Recent Labs    07/10/22 0833  NA 138  K 3.9  CL 106  CO2 21*  GLUCOSE 142*  BUN 25*  CREATININE 1.39*  CALCIUM 8.8*    Intake/Output Summary (Last 24 hours) at 07/11/2022 1341 Last data filed at 07/11/2022 0906 Gross per 24 hour  Intake 50 ml  Output 150 ml  Net -100 ml        Physical Exam: Vital Signs Blood pressure 120/60, pulse 68, temperature 98.7 F (37.1 C), resp. rate 16, height 5' 11" (1.803 m), weight 94.9 kg, SpO2 97 %.  General: Alert and oriented x 3, No apparent distress HEENT: Head is normocephalic, atraumatic, PERRLA, EOMI, sclera anicteric, oral mucosa pink and moist, dentition intact, ext ear canals clear,  Neck: Supple without JVD or lymphadenopathy Heart: Reg rate and rhythm. No murmurs rubs or gallops Chest: CTA bilaterally without wheezes, rales, or rhonchi; no distress Abdomen: Soft, non-tender, non-distended, bowel sounds positive. Extremities: No clubbing, cyanosis, or edema. Pulses are 2+ Psych: Pt's affect is appropriate. Pt is cooperative Skin: Clean and intact without signs of breakdown Neuro:  fair insight and awareness. Normal language, sl dysarthria. Functional memory. RUE 5/5. LUE 3+/5 prox to distal. RLE 4/5 prox to distal. LLE 2/5 prox to 4-/5 distally. No focal sensory deficits. Decreased FMC of left hand and leg. No abnl tone. No ataxia Musculoskeletal: normal rom, no jt pain appreciated    Assessment/Plan: 1. Functional deficits which require  3+ hours per day of interdisciplinary therapy in a comprehensive inpatient rehab setting. Physiatrist is providing close team supervision and 24 hour management of active medical problems listed below. Physiatrist and rehab team continue to assess barriers to discharge/monitor patient progress toward functional and medical goals  Care Tool:  Bathing              Bathing assist       Upper Body Dressing/Undressing Upper body dressing        Upper body assist      Lower Body Dressing/Undressing Lower body dressing            Lower body assist       Toileting Toileting    Toileting assist       Transfers Chair/bed transfer  Transfers assist           Locomotion Ambulation   Ambulation assist              Walk 10 feet activity   Assist           Walk 50 feet activity   Assist           Walk 150 feet activity   Assist  Walk 10 feet on uneven surface  activity   Assist           Wheelchair     Assist               Wheelchair 50 feet with 2 turns activity    Assist            Wheelchair 150 feet activity     Assist          Blood pressure 120/60, pulse 68, temperature 98.7 F (37.1 C), resp. rate 16, height _0  (1.803 m), weight 94.9 kg, SpO2 97 %.  Medical Problem List and Plan: 1. 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              -30 day cardiac event monitoring recommended as outpt by neuro             -patient may shower             -ELOS/Goals: 12-14 days, min assist goals with PT and OT  -Patient is beginning CIR therapies today including PT and OT  2.  Antithrombotics: -DVT/anticoagulation:  Pharmaceutical: Lovenox start             -antiplatelet therapy: Plavix and aspirin daily for minimum 6 months 3. Pain Management: Tylenol as needed.  Has oxycodone as  needed but not using             -See #13 below 4. Mood/Behavior/Sleep: LCSW to evaluate and team to provide emotional support             -antipsychotic agents: n/a             -Continue Cymbalta 60 mg every evening  -wife helps with anxiety 5. Neuropsych/cognition: This patient is capable of making decisions on his own behalf. 6. Skin/Wound Care: Routine skin care checks 7. Fluids/Electrolytes/Nutrition: Strict I's and O's and follow-up chemistries             -2 g sodium diet 8: CAD status post PCI 10/31 overlapping DES to LAD             -Plavix and aspirin for at least 6 months             -Continue lipid lowering therapy             -Follow-up with Dr. Virgina Jock 9: PAD: Severe claudication status post bilateral lower extremity arteriogram             -Follow-up as outpatient with Dr. Virgina Jock 10: Heart failure with reduced ejection fraction:              -Weigh patient daily             -Continue Imdur 30 mg daily             -Continue Norvasc 10 mg daily             -Continue Toprol-XL 25 mg daily   Filed Weights   07/10/22 1822  Weight: 94.9 kg    11: OAB: continue Myrbetriq, Flomax 12: Hyperlipidemia: Continue Lipitor 80 mg nightly             -Continue Zetia 10 mg daily 13: Low back pain/radiculopathy/neuropathy:              -Methadone 5 mg every other day             -Zanaflex  4 mg at nighttime -Following with Dr. Laurance Flatten -reasonable control at present 14: Tobacco use: Cessation counseling 15: Diabetes mellitus: CBGs before every meal, at bedtime.               -Continue Jardiance 10 mg before breakfast             -Continue sliding scale with meals  CBG (last 3)  Recent Labs    07/10/22 2057 07/11/22 0552 07/11/22 1133  GLUCAP 123* 151* 153*    16: Gout: Continue colchicine 0.6 mg twice daily    LOS: 1 days A FACE TO FACE EVALUATION WAS PERFORMED  Meredith Staggers 07/11/2022, 1:41 PM

## 2022-07-11 NOTE — Evaluation (Signed)
Occupational Therapy Assessment and Plan  Patient Details  Name: Thomas [name] MRN: [id number] Date of Birth: 1946/04/15  OT Diagnosis: abnormal posture, apraxia, hemiplegia affecting non-dominant side, and muscle weakness (generalized) Rehab Potential:  Good ELOS: 10-12   Today's Date: 07/11/2022 OT Individual Time: 9:00-10:39, 1120-1200 OT Individual Time Calculation (min): 40 min   , 99 (mins)  Hospital Problem: Principal Problem:   Acute ischemic stroke [facility])   Past Medical History:  Past Medical History:  Diagnosis Date   Actinic keratosis 05/12/2017   Back pain    Chronic systolic (congestive) heart failure ([place]) 05/06/2022   Depression 05/12/2017   Diabetes ([place])    GERD (gastroesophageal reflux disease) 05/12/2017   Hx of colonic polyps 05/12/2017   Hyperlipidemia 05/12/2017   Hypertension    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/2017   Low back pain 05/12/2017   Lumbar radiculopathy 05/12/2017   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   Obesity 05/12/2017   Osteoarthritis of knee 05/12/2017   Paresthesia 05/12/2017   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   Prostate nodule 05/12/2017   Right rotator cuff tear 10/01/2021   Seborrheic dermatitis 05/12/2017   Past Surgical History:  Past Surgical History:  Procedure Laterality Date   ABDOMINAL AORTOGRAM W/LOWER EXTREMITY N/A 07/07/2022   Procedure: ABDOMINAL AORTOGRAM W/LOWER EXTREMITY;  Surgeon: Nigel Mormon, MD;  Location: [place] CV LAB;  Service: Cardiovascular;  Laterality: N/A;   CORONARY ATHERECTOMY N/A 07/07/2022   Procedure: CORONARY ATHERECTOMY;  Surgeon: Nigel Mormon, MD;  Location: [place] CV LAB;  Service: Cardiovascular;  Laterality: N/A;   CORONARY STENT INTERVENTION N/A 07/07/2022   Procedure: CORONARY STENT INTERVENTION;  Surgeon: Nigel Mormon, MD;  Location: [place] CV LAB;  Service: Cardiovascular;  Laterality: N/A;   HAND SURGERY Right    INTRAVASCULAR PRESSURE WIRE/FFR STUDY N/A 07/07/2022   Procedure:  INTRAVASCULAR PRESSURE WIRE/FFR STUDY;  Surgeon: Nigel Mormon, MD;  Location: [place] CV LAB;  Service: Cardiovascular;  Laterality: N/A;   LEFT HEART CATH AND CORONARY ANGIOGRAPHY N/A 07/07/2022   Procedure: LEFT HEART CATH AND CORONARY ANGIOGRAPHY;  Surgeon: Nigel Mormon, MD;  Location: [place] CV LAB;  Service: Cardiovascular;  Laterality: N/A;   ORIF ANKLE FRACTURE Left 08/15/2007   Distal fibular   REPLACEMENT TOTAL KNEE Right 07/12/2012   RIGHT/LEFT HEART CATH AND CORONARY ANGIOGRAPHY N/A 01/12/2018   Procedure: RIGHT/LEFT HEART CATH AND CORONARY ANGIOGRAPHY;  Surgeon: Belva Crome, MD;  Location: [place] CV LAB;  Service: Cardiovascular;  Laterality: N/A;   RIGHT/LEFT HEART CATH AND CORONARY ANGIOGRAPHY N/A 05/19/2022   Procedure: RIGHT/LEFT HEART CATH AND CORONARY ANGIOGRAPHY;  Surgeon: Nigel Mormon, MD;  Location: [place] CV LAB;  Service: Cardiovascular;  Laterality: N/A;   SHOULDER SURGERY Right    UMBILICAL HERNIA REPAIR  06/23/2013    Assessment & Plan Clinical Impression: Patient is a 76 year old male with a history of coronary artery disease.  On 07/01/2022 by Dr. Marina Goodell and arrangements made for left heart cath with intervention.  He presented to the Cath Lab on 10/31 and underwent PCI with drug-eluting stents to the LAD.  Recommendations are for Plavix and aspirin daily for minimum of 6 months.  He was admitted for observation.  On 11/1, he complained of left hand numbness and weakness.  He reported left hand numbness for approximately 3 days.  Rapid response team was notified and stat CT of the head was performed.  RI of the brain showed bilateral embolic shower, right greater than left.  MRA of the head with bilateral M2 and P2 stenoses, left sci-fi and atherosclerosis.  Carotid Doppler unremarkable.  On exam, he had mild left facial droop left upper extremity and left proximal lower extremity weakness.  Neurology for infarct most likely  related to cardiac procedure although other cardioembolic sources cannot be completely ruled out.  Echo with ejection fraction 50 to 55%.  Recommend 30-day cardiac event monitoring as outpatient.  He is tolerating 2 g sodium diet.The patient requires inpatient physical medicine and rehabilitation evaluations and treatment secondary to dysfunction due to acute ischemic infarcts.  Patient transferred to CIR on 07/10/2022 .    Patient currently requires mod with basic self-care skills secondary to muscle weakness and abnormal tone.  Prior to hospitalization, patient could complete BADL/IADL with modified independent using a single prong cane  Patient will benefit from skilled intervention to increase independence with basic self-care skills prior to discharge home with care partner.  Anticipate patient will require 24 hour supervision and follow up home health.  OT - End of Session Endurance Deficit: Yes Endurance Deficit Description: Global deconditioning; Poor endurance/activity tolerance with poor frustration tolerance OT Assessment OT Patient demonstrates impairments in the following area(s): Balance;Endurance;Behavior OT Basic ADL's Functional Problem(s): Grooming;Bathing;Dressing;Toileting OT Advanced ADL's Functional Problem(s): Simple Meal Preparation OT Transfers Functional Problem(s): Tub/Shower;Toilet OT Plan OT Duration/Estimated Length of Stay: 10-12 OT Treatment/Interventions: Neuromuscular re-education;Patient/family education;UE/LE Strength taining/ROM;Therapeutic Exercise;Self Care/advanced ADL retraining OT Self Feeding Anticipated Outcome(s): s/u OT Basic Self-Care Anticipated Outcome(s): ModI OT Toileting Anticipated Outcome(s): ModI OT Bathroom Transfers Anticipated Outcome(s): ModI OT Recommendation Patient destination: Home Follow Up Recommendations: Home health OT Equipment Details: patient indicated that he has shower bench and BSC   OT  Evaluation Precautions/Restrictions Fall risk, DM, behavior   General   Vital Signs Pulse 60, BP 126/65, 02 98   Pain no pain at the time of OT eval   Home Living/Prior Functioning Home Living Living Arrangements: Spouse/significant other Available Help at Discharge: Family, Available 24 hours/day (Spouse reports she is able to provide supervision, however she is unable to lift him as she is ambulating with a walker herself) Type of Home: House Home Access: Level entry (Threshold step to enter) Home Layout: One level Bathroom Shower/Tub: Chiropodist: Handicapped height Bathroom Accessibility: Yes Additional Comments: patient indicated that he drives and desires to return to prior LOF  Lives With: Spouse IADL History Homemaking Responsibilities: Yes Meal Prep Responsibility: Secondary Current License: Yes Mode of Transportation: Car Occupation: Retired Type of Occupation: Clinical biochemist Prior Function Level of Independence: Independent with basic ADLs, Independent with gait, Independent with transfers  Able to Take Stairs?: Yes Driving: Yes Vocation: Retired Biomedical scientist: Company secretary, Clinical biochemist, 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 [employer] Leisure: Hobbies-yes (Comment) (Enjoys Hydrographic surveyor for [employer]) Vision  Wears readers Perception   intact Praxis Praxis: Intact Cognition Cognition Overall Cognitive Status: Within Functional Limits for tasks assessed Arousal/Alertness: Awake/alert Orientation Level: Person;Place;Situation Person: Oriented Place: Oriented Situation: Oriented Memory: Impaired Memory Impairment: Decreased recall of new information;Decreased short term memory Decreased Short Term Memory: Verbal basic Attention: Sustained;Focused Focused Attention: Appears intact Sustained Attention: Impaired Awareness: Impaired Problem Solving: Impaired Behaviors:  (frustration over funcitonal  status) Safety/Judgment: Appears intact Comments: Spouse reports when patient initially came into the hospital, his cognition was impaired however currently seh states he is back to baseline. Brief Interview for Mental Status (BIMS) Temporal Orientation: Year: Missed by 1 year Temporal Orientation: Month: Accurate within 5 days Temporal Orientation: Day:  Correct Recall: "Sock": Yes, no cue required Recall: "Blue": Yes, no cue required Recall: "Bed": Yes, after cueing ("a piece of furniture") BIMS Summary Score: 99 Sensation  Able to detect stimuli with vision occluded  Motor    Challenges with gross and fine motor activity Trunk/Postural Assessment  Cervical Assessment Cervical Assessment: Within Functional Limits Thoracic Assessment Thoracic Assessment: Exceptions to [facility] (Rounded shoulders) Lumbar Assessment Lumbar Assessment: Exceptions to [facility] (Posterior pelvic tilt) Postural Control Postural Control: Deficits on evaluation Righting Reactions: Delayed Protective Responses: Delayed  Balance Balance Balance Assessed: Yes Static Sitting Balance Static Sitting - Balance Support: Feet supported (BLE supported by footrest) Static Sitting - Level of Assistance: 5: Stand by assistance Static Sitting - Comment/# of Minutes:  (The pt is able to come from sit to stand using the RW and chair alarm for addiitonal balance for positioning) Dynamic Sitting Balance Dynamic Sitting - Balance Support: During functional activity Dynamic Sitting - Level of Assistance: 4: Min assist Sitting balance - Comments: supervision static sitting EOB, L lateral lean Static Standing Balance Static Standing - Balance Support: Right upper extremity supported Static Standing - Level of Assistance: 4: Min assist Dynamic Standing Balance Dynamic Standing - Balance Support: Right upper extremity supported;During functional activity Dynamic Standing - Level of Assistance: 3: Mod assist;4: Min  assist Extremity/Trunk Assessment RUE Assessment RUE Assessment: Within Functional Limits LUE Assessment Passive Range of Motion (PROM) Comments: [facility]  mix tone  Care Tool Care Tool Self Care Eating   Eating Assist Level: Set up assist    Oral Care    Oral Care Assist Level: Set up assist    Bathing   Body parts bathed by patient: Right arm;Left arm;Chest;Abdomen;Front perineal area;Right upper leg;Left upper leg;Face Body parts bathed by helper: Right lower leg;Left lower leg;Buttocks        Upper Body Dressing(including orthotics)   What is the patient wearing?: Pull over shirt   Assist Level: Minimal Assistance - Patient > 75%    Lower Body Dressing (excluding footwear)   What is the patient wearing?: Underwear/pull up;Pants Assist for lower body dressing: Moderate Assistance - Patient 50 - 74%    Putting on/Taking off footwear   What is the patient wearing?: [place] for footwear: Maximal Assistance - Patient 25 - 49%       Care Tool Toileting Toileting activity   Assist for toileting: Moderate Assistance - Patient 50 - 74%     Care Tool Bed Mobility Roll left and right activity   Roll left and right assist level: Minimal Assistance - Patient > 75%    Sit to lying activity   Sit to lying assist level: Contact Guard/Touching assist    Lying to sitting on side of bed activity   Lying to sitting on side of bed assist level: the ability to move from lying on the back to sitting on the side of the bed with no back support.: Minimal Assistance - Patient > 75%     Care Tool Transfers Sit to stand transfer   Sit to stand assist level: Moderate Assistance - Patient 50 - 74%    Chair/bed transfer   Chair/bed transfer assist level: Moderate Assistance - Patient 50 - 74%     Toilet transfer    [place] Tool Cognition  Expression of Ideas and Wants Expression of Ideas and Wants: 4. Without difficulty (complex and basic) - expresses complex messages  without difficulty and with speech that is clear and  easy to understand  Understanding Verbal and Non-Verbal Content Understanding Verbal and Non-Verbal Content: 3. Usually understands - understands most conversations, but misses some part/intent of message. Requires cues at times to understand   Memory/Recall Ability Memory/Recall Ability : That he or she is in a hospital/hospital unit;Current season   Refer to Care Plan for Long Term Goals  SHORT TERM GOAL WEEK 1 OT Short Term Goal 1 (Week 1): Pt will improve UB strength to 4+/5 MMT for safe and I task performance. OT Short Term Goal 2 (Week 1): Pt with complete UB/LB dressing with Ind OT Short Term Goal 3 (Week 1): Patient will complete all functional transfers with ModI  Recommendations for other services: None    Skilled Therapeutic Intervention  Patient complete BADL related task in functional transfers from supine in bed ot EOB with MINA using the bed rails for assistance, her presented with challenges maintain static sit secondary to left lateral lean and required assistance for repositioning.  The pt was able to bathe his UB with MinA secondary to the same precipitant.  The pt required Mod A for LB task performance, he was able to donn a over head shirt with MinA using the hemi technique with initial cues while incorporating the bed rails to maintain position.  The pt was ModA for doffing his underwear and pants and MaxA for for his socks and shoes.  The pt transferred from EOB to the w/c with ModAx 2  using the hemi walker for ambulating. The pt was able to brush his teeth and hair with s/u assist.  In my opinion, the pt would benefit from OT services to improve upon his function independence with BADL related task.  The pt is in agreement with a treatment plan forced on UB strengthening, NMR of the LUE, activity tolerance, transfers, BADL training for bathing and dressing, and pt education regarding his condition.  The pt presents with good  motivation towards goal attainment with a program in place for 10-12 days to maximize his rehab potential.  The pt would also benefit from [facility] service to insure a safe transition to home.      ADL   Mobility  Bed Mobility Bed Mobility: Rolling Right;Rolling Left;Supine to Sit;Sit to Supine Rolling Right: Minimal Assistance - Patient > 75% Rolling Left: Contact Guard/Touching assist Supine to Sit: Minimal Assistance - Patient > 75% Sit to Supine: Contact Guard/Touching assist Transfers Sit to Stand: Moderate Assistance - Patient 50-74% Stand to Sit: Moderate Assistance - Patient 50-74%   Discharge Criteria: Patient will be discharged from OT if patient refuses treatment 3 consecutive times without medical reason, if treatment goals not met, if there is a change in medical status, if patient makes no progress towards goals or if patient is discharged from hospital.  The above assessment, treatment plan, treatment alternatives and goals were discussed and mutually agreed upon: by patient  Yvonne Kendall 07/11/2022, 5:47 PM

## 2022-07-12 DIAGNOSIS — E1169 Type 2 diabetes mellitus with other specified complication: Secondary | ICD-10-CM | POA: Diagnosis not present

## 2022-07-12 DIAGNOSIS — I639 Cerebral infarction, unspecified: Secondary | ICD-10-CM | POA: Diagnosis not present

## 2022-07-12 DIAGNOSIS — I739 Peripheral vascular disease, unspecified: Secondary | ICD-10-CM | POA: Diagnosis not present

## 2022-07-12 DIAGNOSIS — M5441 Lumbago with sciatica, right side: Secondary | ICD-10-CM | POA: Diagnosis not present

## 2022-07-12 LAB — GLUCOSE, CAPILLARY
Glucose-Capillary: 164 mg/dL — ABNORMAL HIGH (ref 70–99)
Glucose-Capillary: 136 mg/dL — ABNORMAL HIGH (ref 70–99)
Glucose-Capillary: 152 mg/dL — ABNORMAL HIGH (ref 70–99)
Glucose-Capillary: 136 mg/dL — ABNORMAL HIGH (ref 70–99)

## 2022-07-12 MED ORDER — OXYCODONE HCL 5 MG PO TABS
5.0000 mg | ORAL_TABLET | Freq: Three times a day (TID) | ORAL | Status: DC | PRN
  Filled 2022-07-12: qty 1

## 2022-07-12 MED ORDER — OXYCODONE HCL 5 MG PO TABS
5.0000 mg | ORAL_TABLET | Freq: Three times a day (TID) | ORAL | Status: DC
  Administered 2022-07-12 – 2022-07-22 (×31): 5 mg via ORAL
  Filled 2022-07-12 (×31): qty 1

## 2022-07-12 NOTE — Evaluation (Signed)
Speech Language Pathology Assessment and Plan  Patient Details  Name: Thomas [name] MRN: [id number] Date of Birth: 23-Nov-1945  SLP Diagnosis: Cognitive Impairments  Rehab Potential: Good ELOS: 10-12 days    Today's Date: 07/12/2022 SLP Individual Time: 0930-1030 SLP Individual Time Calculation (min): 20 min   Hospital Problem: Principal Problem:   Acute ischemic stroke [facility])  Past Medical History:  Past Medical History:  Diagnosis Date   Actinic keratosis 05/12/2017   Back pain    Chronic systolic (congestive) heart failure ([place]) 05/06/2022   Depression 05/12/2017   Diabetes ([place])    GERD (gastroesophageal reflux disease) 05/12/2017   Hx of colonic polyps 05/12/2017   Hyperlipidemia 05/12/2017   Hypertension    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/2017   Low back pain 05/12/2017   Lumbar radiculopathy 05/12/2017   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   Obesity 05/12/2017   Osteoarthritis of knee 05/12/2017   Paresthesia 05/12/2017   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   Prostate nodule 05/12/2017   Right rotator cuff tear 10/01/2021   Seborrheic dermatitis 05/12/2017   Past Surgical History:  Past Surgical History:  Procedure Laterality Date   ABDOMINAL AORTOGRAM W/LOWER EXTREMITY N/A 07/07/2022   Procedure: ABDOMINAL AORTOGRAM W/LOWER EXTREMITY;  Surgeon: Nigel Mormon, MD;  Location: [place];  Service: Cardiovascular;  Laterality: N/A;   CORONARY ATHERECTOMY N/A 07/07/2022   Procedure: CORONARY ATHERECTOMY;  Surgeon: Nigel Mormon, MD;  Location: [facility];  Service: Cardiovascular;  Laterality: N/A;   CORONARY STENT INTERVENTION N/A 07/07/2022   Procedure: CORONARY STENT INTERVENTION;  Surgeon: Nigel Mormon, MD;  Location: [place];  Service: Cardiovascular;  Laterality: N/A;   HAND SURGERY Right    INTRAVASCULAR PRESSURE WIRE/FFR STUDY N/A 07/07/2022   Procedure: INTRAVASCULAR PRESSURE WIRE/FFR STUDY;  Surgeon: Nigel Mormon, MD;  Location: [facility];  Service: Cardiovascular;  Laterality: N/A;   LEFT HEART CATH AND CORONARY ANGIOGRAPHY N/A 07/07/2022   Procedure: LEFT HEART CATH AND CORONARY ANGIOGRAPHY;  Surgeon: Nigel Mormon, MD;  Location: [facility];  Service: Cardiovascular;  Laterality: N/A;   ORIF ANKLE FRACTURE Left 08/15/2007   Distal fibular   REPLACEMENT TOTAL KNEE Right 07/12/2012   RIGHT/LEFT HEART CATH AND CORONARY ANGIOGRAPHY N/A 01/12/2018   Procedure: RIGHT/LEFT HEART CATH AND CORONARY ANGIOGRAPHY;  Surgeon: Belva Crome, MD;  Location: [facility];  Service: Cardiovascular;  Laterality: N/A;   RIGHT/LEFT HEART CATH AND CORONARY ANGIOGRAPHY N/A 05/19/2022   Procedure: RIGHT/LEFT HEART CATH AND CORONARY ANGIOGRAPHY;  Surgeon: Nigel Mormon, MD;  Location: [facility];  Service: Cardiovascular;  Laterality: N/A;   SHOULDER SURGERY Right    UMBILICAL HERNIA REPAIR  06/23/2013    Assessment / Plan / Recommendation Clinical Impression  Thomas [name] is a 76 year old male with a history of coronary artery disease.  On 07/01/2022 by Dr. Marina Goodell and arrangements made for left heart cath with intervention.  He presented to the Cath Lab on 10/31 and underwent PCI with drug-eluting stents to the LAD.  Recommendations are for Plavix and aspirin daily for minimum of 6 months.  He was admitted for observation.  On 11/1, he complained of left hand numbness and weakness.  He reported left hand numbness for approximately 3 days.  Rapid response team was notified and stat CT of the head was performed.  RI of the brain showed bilateral embolic shower, right greater than left.  MRA of the head with bilateral M2  and P2 stenoses, left sci-fi and atherosclerosis.  Carotid Doppler unremarkable.  On exam, he had mild left facial droop left upper extremity and left proximal lower extremity weakness.  Neurology for infarct most likely related to cardiac procedure although other cardioembolic sources  cannot be completely ruled out.  Echo with ejection fraction 50 to 55%.  Recommend 30-day cardiac event monitoring as outpatient.  He is tolerating 2 g sodium diet.The patient requires inpatient physical medicine and rehabilitation evaluations and treatment secondary to dysfunction due to acute ischemic infarcts.   Pt was admitted to CIR and SLP evaluation was completed on 07/12/22 with results as follows:   Pt presents with mild to moderate cognitive deficits as evidenced by scores in the moderate range of impairment on construction and memory subtests of the [employer] standardized cognitive assessment.  Pt also demonstrated decreased emergent awareness during functional tasks where pt could identify inaccuracies but was unable to correct them.  Informally, pt needed frequent repetition of information and was distracted internally due to poor frustration tolerance at slow therapy schedule today.  As a result, pt would benefit from skilled ST while inpatient in order to maximize functional independence and reduce burden of care prior to discharge.  Anticipate that pt will need 24/7 supervision at discharge in addition to [place] follow up at next level of care.    Skilled Therapeutic Interventions          Cognitive-linguistic evaluation completed with results and recommendations reviewed with patient.    SLP Assessment  Patient will need skilled [place] [facility] during CIR admission    Recommendations  Patient destination: Home Follow up Recommendations: Home Health SLP;24 hour supervision/assistance Equipment Recommended: None recommended by SLP    SLP Frequency 3 to 5 out of 7 days   SLP Duration  SLP Intensity  SLP Treatment/Interventions 10-12 days  Minumum of 1-2 x/day, 30 to 90 minutes  Cognitive remediation/compensation;Cueing hierarchy;Internal/external aids;Patient/family education;Environmental controls    Pain Pain Assessment Pain Scale: 0-10 Pain Score: 0-No  pain  Prior Functioning Cognitive/Linguistic Baseline: Within functional limits Type of Home: House  Lives With: Spouse Available Help at Discharge: Family;Available 24 hours/day Vocation: Retired  Programmer, systems Overall Cognitive Status: Impaired/Different from baseline Arousal/Alertness: Awake/alert Orientation Level: Oriented X4 Sustained Attention: Impaired Sustained Attention Impairment: Verbal basic;Functional basic Memory: Impaired Memory Impairment: Decreased recall of new information Awareness: Impaired Awareness Impairment: Emergent impairment Problem Solving: Impaired Problem Solving Impairment: Verbal basic;Functional basic Behaviors: Poor frustration tolerance Safety/Judgment: Impaired  Comprehension Auditory Comprehension Overall Auditory Comprehension: Appears within functional limits for tasks assessed Expression Expression Primary Mode of Expression: Verbal Verbal Expression Overall Verbal Expression: Appears within functional limits for tasks assessed Oral Motor Oral Motor/Sensory Function Overall Oral Motor/Sensory Function: Within functional limits Motor Speech Overall Motor Speech: Appears within functional limits for tasks assessed  Care Tool Care Tool Cognition Ability to hear (with hearing aid or hearing appliances if normally used Ability to hear (with hearing aid or hearing appliances if normally used): 1. Minimal difficulty - difficulty in some environments (e.g. when person speaks softly or setting is noisy)   Expression of Ideas and Wants Expression of Ideas and Wants: 4. Without difficulty (complex and basic) - expresses complex messages without difficulty and with speech that is clear and easy to understand   Understanding Verbal and Non-Verbal Content Understanding Verbal and Non-Verbal Content: 3. Usually understands - understands most conversations, but misses some part/intent of message. Requires cues at times to understand   Memory/Recall  Ability Memory/Recall Ability : That he or she is in a hospital/hospital unit;Current season    Short Term Goals: Week 1: SLP Short Term Goal 1 (Week 1): Pt will utilize memory compensatory aids to recall daily information with mod assist. SLP Short Term Goal 2 (Week 1): Pt will complete mildly complex familiar tasks with mod assist for functional problem solving. SLP Short Term Goal 3 (Week 1): Pt will recognize and correct errors during functional tasks wtih mod assist. SLP Short Term Goal 4 (Week 1): Pt will sustain his attention to mildly complex tasks for 10 minute intervals with min assist for redirection.  Refer to Care Plan for Long Term Goals  Recommendations for other services: None   Discharge Criteria: Patient will be discharged from SLP if patient refuses treatment 3 consecutive times without medical reason, if treatment goals not met, if there is a change in medical status, if patient makes no progress towards goals or if patient is discharged from hospital.  The above assessment, treatment plan, treatment alternatives and goals were discussed and mutually agreed upon: by patient  Emilio Math 07/12/2022, 12:38 PM

## 2022-07-12 NOTE — Progress Notes (Signed)
PROGRESS NOTE   Subjective/Complaints: Pt states "I'm not doing well". I asked him to specify and he reports that his back and legs are hurting him. Also frustrated that he can't move well.  He asked if he needs hospice.   ROS: Patient denies fever, rash, sore throat, blurred vision, dizziness, nausea, vomiting, diarrhea, cough, shortness of breath or chest pain,     Objective:   No results found. No results for input(s): "WBC", "HGB", "HCT", "PLT" in the last 72 hours.  Recent Labs    07/10/22 0833  NA 138  K 3.9  CL 106  CO2 21*  GLUCOSE 142*  BUN 25*  CREATININE 1.39*  CALCIUM 8.8*    Intake/Output Summary (Last 24 hours) at 07/12/2022 3016 Last data filed at 07/12/2022 0412 Gross per 24 hour  Intake 840 ml  Output 1100 ml  Net -260 ml        Physical Exam: Vital Signs Blood pressure (!) 158/86, pulse 79, temperature 98.2 F (36.8 C), temperature source Oral, resp. rate 20, height _0  (1.803 m), weight 94.9 kg, SpO2 97 %.  Constitutional: No distress . Vital signs reviewed. obese HEENT: NCAT, EOMI, oral membranes moist Neck: supple Cardiovascular: RRR without murmur. No JVD    Respiratory/Chest: CTA Bilaterally without wheezes or rales. Normal effort    GI/Abdomen: BS +, non-tender, non-distended Ext: no clubbing, cyanosis, or edema Psych:anxious but cooperative  Skin: Clean and intact without signs of breakdown Neuro:  fair insight and awareness. Normal language, sl dysarthria. Functional memory. RUE 5/5. LUE 3+/5 prox to distal. RLE 4/5 prox to distal. LLE 2/5 prox to 4-/5 distally. No focal sensory deficits. Decreased FMC of left hand and leg. No abnl tone. No ataxia Musculoskeletal: LB ttp, pain in LB also with A/PROM of both legs.    Assessment/Plan: 1. Functional deficits which require 3+ hours per day of interdisciplinary therapy in a comprehensive inpatient rehab setting. Physiatrist is  providing close team supervision and 24 hour management of active medical problems listed below. Physiatrist and rehab team continue to assess barriers to discharge/monitor patient progress toward functional and medical goals  Care Tool:  Bathing    Body parts bathed by patient: Right arm, Left arm, Chest, Abdomen, Front perineal area, Right upper leg, Left upper leg, Face   Body parts bathed by helper: Right lower leg, Left lower leg, Buttocks     Bathing assist       Upper Body Dressing/Undressing Upper body dressing   What is the patient wearing?: Pull over shirt    Upper body assist Assist Level: Minimal Assistance - Patient > 75%    Lower Body Dressing/Undressing Lower body dressing      What is the patient wearing?: Underwear/pull up, Pants     Lower body assist Assist for lower body dressing: Moderate Assistance - Patient 50 - 74%     Toileting Toileting    Toileting assist Assist for toileting: Moderate Assistance - Patient 50 - 74%     Transfers Chair/bed transfer  Transfers assist     Chair/bed transfer assist level: Moderate Assistance - Patient 50 - 74%     Locomotion Ambulation  Ambulation assist      Assist level: Moderate Assistance - Patient 50 - 74% Assistive device: Walker-hemi Max distance: 15'   Walk 10 feet activity   Assist     Assist level: Moderate Assistance - Patient - 50 - 74% Assistive device: Walker-hemi   Walk 50 feet activity   Assist Walk 50 feet with 2 turns activity did not occur: Safety/medical concerns (Patient unable to ambulate >15' and assess stair, curb, ramp mobility at this time secondary to reports of increased B knee pain, impaired endurance, decreased global strenght)         Walk 150 feet activity   Assist Walk 150 feet activity did not occur: Safety/medical concerns         Walk 10 feet on uneven surface  activity   Assist Walk 10 feet on uneven surfaces activity did not occur:  Safety/medical concerns         Wheelchair     Assist Is the patient using a wheelchair?: Yes Type of Wheelchair: Manual    Wheelchair assist level: Maximal Assistance - Patient 25 - 49% Max wheelchair distance: 150'    Wheelchair 50 feet with 2 turns activity    Assist        Assist Level: Moderate Assistance - Patient 50 - 74%   Wheelchair 150 feet activity     Assist      Assist Level: Maximal Assistance - Patient 25 - 49%   Blood pressure (!) 158/86, pulse 79, temperature 98.2 F (36.8 C), temperature source Oral, resp. rate 20, height _0  (1.803 m), weight 94.9 kg, SpO2 97 %.  Medical Problem List and Plan: 1. 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              -30 day cardiac event monitoring recommended as outpt by neuro             -patient may shower             -ELOS/Goals: 12-14 days, min assist goals with PT and OT  -Continue CIR therapies including PT, OT  2.  Antithrombotics: -DVT/anticoagulation:  Pharmaceutical: Lovenox start             -antiplatelet therapy: Plavix and aspirin daily for minimum 6 months 3. Pain Management: Tylenol as needed.  Has oxycodone as needed but not using             -See #13 below 4. Mood/Behavior/Sleep: LCSW to evaluate and team to provide emotional support             -antipsychotic agents: n/a             -Continue Cymbalta 60 mg every evening  -pt with a lot of anxiety currently. Would benefit from neuropsych visit.    -discussed anxiety with him today. A lot of it centers around his pain, loss of independence, wife being away. Tried to provide positive reinforcement. Hopefully by reducing his pain we can give him an immediate boost 5. Neuropsych/cognition: This patient is capable of making decisions on his own behalf. 6. Skin/Wound Care: Routine skin care checks 7. Fluids/Electrolytes/Nutrition:  Strict I's and O's and follow-up chemistries             -2 g sodium diet 8: CAD status post PCI 10/31 overlapping DES to LAD             -Plavix and aspirin for at  least 6 months             -Continue lipid lowering therapy             -Follow-up with Dr. Virgina Jock 9: PAD: Severe claudication status post bilateral lower extremity arteriogram             -Follow-up as outpatient with Dr. Virgina Jock 10: Heart failure with reduced ejection fraction:              -Weigh patient daily             -Continue Imdur 30 mg daily             -Continue Norvasc 10 mg daily             -Continue Toprol-XL 25 mg daily  -need daily weights Filed Weights   07/10/22 1822  Weight: 94.9 kg    11: OAB: continue Myrbetriq, Flomax 12: Hyperlipidemia: Continue Lipitor 80 mg nightly             -Continue Zetia 10 mg daily 13: Low back pain/radiculopathy/neuropathy:              -Methadone 5 mg every other day             -Zanaflex 4 mg at nighttime -Following with Dr. Laurance Flatten -will schedule oxycodone 84m 3x per day as he says this was how he was using at home.  14: Tobacco use: Cessation counseling 15: Diabetes mellitus: CBGs before every meal, at bedtime.               -Continue Jardiance 10 mg before breakfast             -Continue sliding scale with meals  CBG (last 3)  Recent Labs    07/11/22 1706 07/11/22 2029 07/12/22 0634  GLUCAP 262* 165* 164*    -follow for consistent pattern 16: Gout: Continue colchicine 0.6 mg twice daily    LOS: 2 days A FACE TO FACE EVALUATION WAS PERFORMED  ZMeredith Staggers11/01/2022, 7:22 AM

## 2022-07-13 ENCOUNTER — Encounter: Payer: Medicare Other | Admitting: Physical Medicine and Rehabilitation

## 2022-07-13 DIAGNOSIS — I639 Cerebral infarction, unspecified: Secondary | ICD-10-CM | POA: Diagnosis not present

## 2022-07-13 LAB — BASIC METABOLIC PANEL
Anion gap: 12 (ref 5–15)
BUN: 20 mg/dL (ref 8–23)
CO2: 23 mmol/L (ref 22–32)
Calcium: 9.3 mg/dL (ref 8.9–10.3)
Chloride: 108 mmol/L (ref 98–111)
Creatinine, Ser: 1.5 mg/dL — ABNORMAL HIGH (ref 0.61–1.24)
GFR, Estimated: 48 mL/min — ABNORMAL LOW (ref >60)
Glucose, Bld: 141 mg/dL — ABNORMAL HIGH (ref 70–99)
Potassium: 4.4 mmol/L (ref 3.5–5.1)
Sodium: 143 mmol/L (ref 135–145)

## 2022-07-13 LAB — CBC
HCT: 38.1 % — ABNORMAL LOW (ref 39.0–52.0)
Hemoglobin: 13.4 g/dL (ref 13.0–17.0)
MCH: 31.8 pg (ref 26.0–34.0)
MCHC: 35.2 g/dL (ref 30.0–36.0)
MCV: 90.5 fL (ref 80.0–100.0)
Platelets: 221 10*3/uL (ref 150–400)
RBC: 4.21 MIL/uL — ABNORMAL LOW (ref 4.22–5.81)
RDW: 12.2 % (ref 11.5–15.5)
WBC: 7 10*3/uL (ref 4.0–10.5)
nRBC: 0 % (ref 0.0–0.2)

## 2022-07-13 LAB — GLUCOSE, CAPILLARY
Glucose-Capillary: 144 mg/dL — ABNORMAL HIGH (ref 70–99)
Glucose-Capillary: 191 mg/dL — ABNORMAL HIGH (ref 70–99)
Glucose-Capillary: 120 mg/dL — ABNORMAL HIGH (ref 70–99)
Glucose-Capillary: 146 mg/dL — ABNORMAL HIGH (ref 70–99)

## 2022-07-13 MED ORDER — DICLOFENAC SODIUM 1 % EX GEL
2.0000 g | Freq: Four times a day (QID) | CUTANEOUS | Status: DC
  Administered 2022-07-13 – 2022-07-22 (×36): 2 g via TOPICAL
  Filled 2022-07-13: qty 100

## 2022-07-13 NOTE — Progress Notes (Signed)
Inpatient Rehabilitation  Patient information reviewed and entered into eRehab system by Aariana Shankland M. Courtnie Brenes, M.A., CCC/SLP, PPS Coordinator.  Information including medical coding, functional ability and quality indicators will be reviewed and updated through discharge.    

## 2022-07-13 NOTE — Progress Notes (Addendum)
Physical Therapy Session Note  Patient Details  Name: Thomas [name] MRN: [id number] Date of Birth: [birth year]  Today's Date: 07/13/2022 PT Individual Time: 7017-7939 PT Individual Time Calculation (min): 72 min   Short Term Goals: Week 1:  PT Short Term Goal 1 (Week 1): Patient will perform bed/chair transfer with LRAD and MinA PT Short Term Goal 2 (Week 1): Patient will ambulate 79' with LRAD and MinA PT Short Term Goal 3 (Week 1): Patient will initiate curb training for safety entry into home  Skilled Therapeutic Interventions/Progress Updates:      Pt supine in bed to start with his wife at bedside. Pt agreeable to PT tx and denies pain. Reports L knee buckling and reports frustration with weakness and being "handicap."   Supine<>sitting EOB with supervision with HOB elevated, relying on bed rail for trunk support. Forward scooting to EOB with supervision, no assist. Sit<>stand with minA and stand<>pivot transfer with minA and no AD to w/c. L knee buckling while weight shifting.  Transported to main rehab gym for time management.   Squat<>pivot transfer with minA towards his stronger R side  from w/c to mat table. Decreased safety awareness while transferring with poor hip clearance and sitting prematurely.  Active warm up completed as follows: - 2x5 sit<>stands. CGA for safety. Pt using back of legs to help with powering to rise.  - 2x20 standing marching with RW. Emphasis on foot clearance, quad activation, NMR for LLE for loading.  *Provided him with LUE hand splint for RW as hand slides/falls off of RW due to difficulty sustaining grasp while multitasking.   Gait training 82f + 62fwith minA and RW. L knee buckling frequently. Cues for increasing stride length, upward and erect posture, and keeping body within walker frame. TC to L quad during stance phase to improve activation. Gait distance limited by fatigue.   Stair training up/down 6inch steps with 2 hand rails, minA for  ascent and modA for descent. Step-to pattern while forward facing, R leading ascent and L foot leading descent.  -LUE falling off railing due to inattention. Assist for guiding along rail as needed. -L knee buckling during descent.   Repeated step ups to 6inch steps with modA and 2 hand rail support. L foot leading to work on loading for NMR. 2x10 with rest breaks.   Pt requesting for HEP that he can complete in his room. Print out provided of basic supine there-ex:  Access Code: [email or web address] URL: [email or web address] Date: 07/13/2022 Prepared by: ChGinnie SmartExercises - Supine Hip Abduction  - 1 x daily - 7 x weekly - 3 sets - 10 reps - Supine Gluteal Sets  - 1 x daily - 7 x weekly - 3 sets - 10 reps - Supine Ankle Pumps  - 1 x daily - 7 x weekly - 3 sets - 10 reps - Supine Heel Slide  - 1 x daily - 7 x weekly - 3 sets - 10 reps - Supine Knee Extension Strengthening  - 1 x daily - 7 x weekly - 3 sets - 10 reps  Pt returned to his room and remained sitting in w/c with safety belt alarm on, call bell in reach, wife at bedside. All needs met. NT in room to take BG.   Therapy Documentation Precautions:  Precautions Precautions: Fall Precaution Comments: L hemi Restrictions Weight Bearing Restrictions: No General:    Therapy/Group: Individual Therapy  Thomas [name] PT 07/13/2022, 7:36 AM

## 2022-07-13 NOTE — Progress Notes (Signed)
Inpatient Rehabilitation Care Coordinator Assessment and Plan Patient Details  Name: Thomas [name] MRN: [id number] Date of Birth: 1945/10/26  Today's Date: 07/13/2022  Hospital Problems: Principal Problem:   Acute ischemic stroke [facility])  Past Medical History:  Past Medical History:  Diagnosis Date   Actinic keratosis 05/12/2017   Back pain    Chronic systolic (congestive) heart failure ([place]) 05/06/2022   Depression 05/12/2017   Diabetes ([place])    GERD (gastroesophageal reflux disease) 05/12/2017   Hx of colonic polyps 05/12/2017   Hyperlipidemia 05/12/2017   Hypertension    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/2017   Low back pain 05/12/2017   Lumbar radiculopathy 05/12/2017   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   Obesity 05/12/2017   Osteoarthritis of knee 05/12/2017   Paresthesia 05/12/2017   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   Prostate nodule 05/12/2017   Right rotator cuff tear 10/01/2021   Seborrheic dermatitis 05/12/2017   Past Surgical History:  Past Surgical History:  Procedure Laterality Date   ABDOMINAL AORTOGRAM W/LOWER EXTREMITY N/A 07/07/2022   Procedure: ABDOMINAL AORTOGRAM W/LOWER EXTREMITY;  Surgeon: Nigel Mormon, MD;  Location: [place] [facility];  Service: Cardiovascular;  Laterality: N/A;   CORONARY ATHERECTOMY N/A 07/07/2022   Procedure: CORONARY ATHERECTOMY;  Surgeon: Nigel Mormon, MD;  Location: [place] [facility];  Service: Cardiovascular;  Laterality: N/A;   CORONARY STENT INTERVENTION N/A 07/07/2022   Procedure: CORONARY STENT INTERVENTION;  Surgeon: Nigel Mormon, MD;  Location: [place] [facility];  Service: Cardiovascular;  Laterality: N/A;   HAND SURGERY Right    INTRAVASCULAR PRESSURE WIRE/FFR STUDY N/A 07/07/2022   Procedure: INTRAVASCULAR PRESSURE WIRE/FFR STUDY;  Surgeon: Nigel Mormon, MD;  Location: [place] [facility];  Service: Cardiovascular;  Laterality: N/A;   LEFT HEART CATH AND CORONARY ANGIOGRAPHY N/A 07/07/2022   Procedure: LEFT HEART CATH AND  CORONARY ANGIOGRAPHY;  Surgeon: Nigel Mormon, MD;  Location: [place] [facility];  Service: Cardiovascular;  Laterality: N/A;   ORIF ANKLE FRACTURE Left 08/15/2007   Distal fibular   REPLACEMENT TOTAL KNEE Right 07/12/2012   RIGHT/LEFT HEART CATH AND CORONARY ANGIOGRAPHY N/A 01/12/2018   Procedure: RIGHT/LEFT HEART CATH AND CORONARY ANGIOGRAPHY;  Surgeon: Belva Crome, MD;  Location: [place] [facility];  Service: Cardiovascular;  Laterality: N/A;   RIGHT/LEFT HEART CATH AND CORONARY ANGIOGRAPHY N/A 05/19/2022   Procedure: RIGHT/LEFT HEART CATH AND CORONARY ANGIOGRAPHY;  Surgeon: Nigel Mormon, MD;  Location: [place] [facility];  Service: Cardiovascular;  Laterality: N/A;   SHOULDER SURGERY Right    UMBILICAL HERNIA REPAIR  06/23/2013   Social History:  reports that he has been smoking cigarettes. He has a 27.50 pack-year smoking history. He has been exposed to tobacco smoke. He has never used smokeless tobacco. He reports that he does not drink alcohol and does not use drugs.  Family / Support Systems Anticipated Caregiver: Dierdre Searles Ability/Limitations of Caregiver: sup/min A Caregiver Availability: 24/7 Family Dynamics: support from spouse  Social History Preferred language: English Religion: Radio broadcast assistant - How often do you need to have someone help you when you read instructions, pamphlets, or other written material from your doctor or pharmacy?: Sometimes Writes: Yes   Abuse/Neglect Abuse/Neglect Assessment Can Be Completed: Yes Physical Abuse: Denies Verbal Abuse: Denies Sexual Abuse: Denies Exploitation of patient/patient's resources: Denies Self-Neglect: Denies  Patient response to: Social Isolation - How often do you feel lonely or isolated from those around you?: Never  Emotional Status Recent Psychosocial Issues: coping Psychiatric  History: n/a Substance Abuse History: n/a  Patient / Family Perceptions, Expectations & Goals Pt/Family  understanding of illness & functional limitations: yes Premorbid pt/family roles/activities: Independent and driving Anticipated changes in roles/activities/participation: spouse able to assist sup/min a Pt/family expectations/goals: sup/min A  Recruitment consultant: None Premorbid Home Care/DME Agencies: Other (Comment) ([facility], [facility], [facility], [facility], [facility]) Is the patient able to respond to transportation needs?: Yes In the past 12 months, has lack of transportation kept you from medical appointments or from getting medications?: No In the past 12 months, has lack of transportation kept you from meetings, work, or from getting things needed for daily living?: No  Discharge Planning Living Arrangements: Spouse/significant other Support Systems: Spouse/significant other Type of Residence: Private residence Insurance Resources: Medicare Does the patient have any problems obtaining your medications?: No Home Management: Independent Patient/Family Preliminary Plans: Spouse able to assist manage Care Coordinator Barriers to Discharge: Lack of/limited family support, Decreased caregiver support, Insurance for SNF coverage, Home environment access/layout Care Coordinator Anticipated Follow Up Needs: HH/OP Expected length of stay: 12-14 Days  Clinical Impression SW met with patient, introduced self and explained role. Patient anticipates discharge back home with spouse to assist sup/minA. Patient ready to lay down but will remain up until after lunch. No additional questions or concerns.  [name] 07/13/2022, 12:20 PM

## 2022-07-13 NOTE — Progress Notes (Signed)
[facility] Individual Statement of Services  Patient Name:  Thomas [name]  Date:  07/13/2022  Welcome to the [facility].  Our goal is to provide you with an individualized program based on your diagnosis and situation, designed to meet your specific needs.  With this comprehensive rehabilitation program, you will be expected to participate in at least 3 hours of rehabilitation therapies Monday-Friday, with modified therapy programming on the weekends.  Your rehabilitation program will include the following services:  Physical Therapy (PT), Occupational Therapy (OT), Speech Therapy (ST), 24 hour per day rehabilitation nursing, Therapeutic Recreaction (TR), Neuropsychology, Care Coordinator, Rehabilitation Medicine, Nutrition Services, Pharmacy Services, and Other  Weekly team conferences will be held on Wednesdays to discuss your progress.  Your Inpatient Rehabilitation Care Coordinator will talk with you frequently to get your input and to update you on team discussions.  Team conferences with you and your family in attendance may also be held.  Expected length of stay:  12-14 Days  Overall anticipated outcome:  Supervision-Min A  Depending on your progress and recovery, your program may change. Your Inpatient Rehabilitation Care Coordinator will coordinate services and will keep you informed of any changes. Your Inpatient Rehabilitation Care Coordinator's name and contact numbers are listed  below.  The following services may also be recommended but are not provided by the [facility]:   [facility] will be made to provide these services after discharge if needed.  Arrangements include referral to agencies that provide these services.  Your insurance has been verified to be:   Medicare A & B Your primary doctor is:  Arlester Marker, MD  Pertinent  information will be shared with your doctor and your insurance company.  Inpatient Rehabilitation Care Coordinator:  Erlene Quan, [place] or [phone]  Information discussed with and copy given to patient by: [name], 07/13/2022, 11:57 AM

## 2022-07-13 NOTE — Progress Notes (Signed)
Occupational Therapy Session Note  Patient Details  Name: Thomas [name] MRN: [id number] Date of Birth: 1946/01/13  Today's Date: 07/13/2022 OT Individual Time: 0800-0900 OT Individual Time Calculation (min): 60 min  OT Individual Time: 1300-1415 OT Individual Time Calculation (min): 75 min    Short Term Goals: Week 1:  OT Short Term Goal 1 (Week 1): Pt will improve UB strength to 4+/5 MMT for safe and I task performance. OT Short Term Goal 2 (Week 1): Pt with complete UB/LB dressing with Ind OT Short Term Goal 3 (Week 1): Patient will complete all functional transfers with ModI  Skilled Therapeutic Interventions/Progress Updates:    Session 1: Pt received in his room resting in his bed receptive to skilled OT session. Pt reported he did not know why he was here and was provided skilled education on purpose of IPR and role of OT. Pt also reporting need to urinate, but unaware of how to use urinal. Pt educated on completing bed level toileting using urinal with good teach back (continent void) min A for urinal placement.  Pt agreeable to getting ready for the day. Pt supine>EOB min A with HOB elevated. Pt doffed dirty gown and shorts sitting EOB with min A. Pt donned clean shirt with min A following education on hemi-dressing technique. Pt donned pants EOB with mod A using modified technique. Pt sit>stand to bring pants to waist mod A. Pt propelled self to sink and completed oral hygiene and grooming tasks with min A. Pt educated on modified technique for donning toothpaste on toothbrush, but was unreceptive to education stating "I will do it my way".  Pt desired to stay in room for rest of OT session completing therapeutic exercises to in sitting to increase functional use of LUE. Pt was left resting in his wc with call bell in reach, chair alarm on, and all needs met. Rn entering room upon OT departure.   Session 2: Pt received in room with wife and PT present. PT beginning to assist Pt back to  bed upon OT arrival. Pt upset d/t Rn not coming quick enough to get him into bed with Pt yelling down hallway to get PT's attention. Pt reported he needed to get into bed and rest his back prior to OT session. OT assisted Pt back to bed squat pivot>EOB>supine min A. Rn entering room shortly after OT arrival with collaboration to encourage Pt to take medications.  Pt provided with 5 minute rest break prior to OT session to encourage improved participation and decrease pain in back and LLE. Pt reported "I don't even know what OT is" with skilled education on OT purpose and role provided. Following rest break Pt receptive to participating in session. Pt supine>EOB>squat pivot to wc min A. Pt transported to therapy gym total A.  Pt squat>pivot to EOM for therapeutic activities. Pt reported he cannot stand more than a few minutes during session today d/t muscle pain in LLE.  Pt completed cone transfer activity with his LUE to practice dynamic sitting balance and increase functional use of LUE. Cones presented to Pt in L visual field to increase awareness of L side of body. Pt able to transfer 8/8 cones mat table<>elevated table with min HHA to facilitate finger extension.  Pt completed 2x10 reps of bicep flexion, chest press, and overhead press exercises using 3# weighted bar to increase BUE strength and coordination. Pt completed functional reach activity grabbing horse shoes presented to Pt outside his BOS in L visual field  and placing them on vertical mirror. Pt completed one sit>stand to hook horse shoe, but reported pain in L knee completing the task in sitting to prevent further pain. Pt able to reach anteriorly and inferiorly to grab horse shoes and hook them on vertical bar at shoulder level maintaining dynamic sitting balance with CGA to intermittent min A.  Following activity, Pt became upset reporting he did not want to do anymore therapy d/t back pain. Pt transported to room total A in wc.  Pt wc>squat  pivot EOB>supine min A. Pt reported need to urinate and was provided with urinal bed level min verbal cues. Pt requesting to return to bed to alleviate back pain. Pt was left resting in his bed with call bell in reach, bed alarm on, and all needs met.  Therapy Documentation Precautions:  Precautions Precautions: Fall Precaution Comments: L hemi Restrictions Weight Bearing Restrictions: No General:   Vital Signs: Therapy Vitals Temp: 98.7 F (37.1 C) Pulse Rate: (Abnormal) 54 Resp: 14 BP: (Abnormal) 152/60 Patient Position (if appropriate): Lying Oxygen Therapy SpO2: 97 % O2 Device: Room Air   ADL: ADL Eating: Set up Where Assessed-Eating:  (based on observation) Grooming: Setup Where Assessed-Grooming: Sitting at sink Upper Body Bathing: Setup Where Assessed-Upper Body Bathing: Edge of bed Lower Body Bathing: Moderate assistance Where Assessed-Lower Body Bathing: Bed level Upper Body Dressing: Minimal assistance, Minimal cueing Where Assessed-Upper Body Dressing: Edge of bed Lower Body Dressing: Moderate assistance, Moderate cueing Where Assessed-Lower Body Dressing: Edge of bed (using the RW coming from sit to stand with the assistance of 2) Toileting: Moderate assistance (with the assistance of 2 people using a RW for additional balance.) Where Assessed-Toileting:  (based on observation of transferring from EOB to w/c) Toilet Transfer: Minimal assistance Toilet Transfer Method: Ambulating Toilet Transfer Equipment: Other (comment) Tub/Shower Transfer: Not assessed   Therapy/Group: Individual Therapy  Janey Genta 07/13/2022, 4:08 PM

## 2022-07-13 NOTE — Progress Notes (Addendum)
PROGRESS NOTE   Subjective/Complaints:  Feels ok, c/o Left knee buckling and poor control of LUE No sig knee pain on TID oxy IR, used voltaren gel at home as well   ROS: Patient denies CP, SOB, N/V/D    Objective:   No results found. Recent Labs    07/13/22 0642  WBC 7.0  HGB 13.4  HCT 38.1*  PLT 221    Recent Labs    07/13/22 0642  NA 143  K 4.4  CL 108  CO2 23  GLUCOSE 141*  BUN 20  CREATININE 1.50*  CALCIUM 9.3     Intake/Output Summary (Last 24 hours) at 07/13/2022 0909 Last data filed at 07/13/2022 0700 Gross per 24 hour  Intake 340 ml  Output 450 ml  Net -110 ml         Physical Exam: Vital Signs Blood pressure (!) 161/70, pulse 66, temperature 98 F (36.7 C), resp. rate 18, height _0  (1.803 m), weight 92 kg, SpO2 97 %.   General: No acute distress Mood and affect are appropriate Heart: Regular rate and rhythm no rubs murmurs or extra sounds Lungs: Clear to auscultation, breathing unlabored, no rales or wheezes Abdomen: Positive bowel sounds, soft nontender to palpation, nondistended Extremities: No clubbing, cyanosis, or edema Skin: No evidence of breakdown, no evidence of rash   Neuro:  fair insight and awareness. Normal language, sl dysarthria. Functional memory. RUE 5/5. LUE 3+/5 prox to distal. RLE 4/5 prox to distal. LLE 2/5 prox to 4-/5 distally. No focal sensory deficits. Decreased FMC of left hand and leg. No abnl tone. Mild dysmetria LUE  Musculoskeletal: Full ROM L knee, no effusion or erythema    Assessment/Plan: 1. Functional deficits which require 3+ hours per day of interdisciplinary therapy in a comprehensive inpatient rehab setting. Physiatrist is providing close team supervision and 24 hour management of active medical problems listed below. Physiatrist and rehab team continue to assess barriers to discharge/monitor patient progress toward functional and medical  goals  Care Tool:  Bathing    Body parts bathed by patient: Right arm, Left arm, Chest, Abdomen, Front perineal area, Right upper leg, Left upper leg, Face   Body parts bathed by helper: Right lower leg, Left lower leg, Buttocks     Bathing assist       Upper Body Dressing/Undressing Upper body dressing   What is the patient wearing?: Pull over shirt    Upper body assist Assist Level: Minimal Assistance - Patient > 75%    Lower Body Dressing/Undressing Lower body dressing      What is the patient wearing?: Underwear/pull up, Pants     Lower body assist Assist for lower body dressing: Moderate Assistance - Patient 50 - 74%     Toileting Toileting    Toileting assist Assist for toileting: Moderate Assistance - Patient 50 - 74%     Transfers Chair/bed transfer  Transfers assist     Chair/bed transfer assist level: Moderate Assistance - Patient 50 - 74%     Locomotion Ambulation   Ambulation assist      Assist level: Moderate Assistance - Patient 50 - 74% Assistive device: Walker-hemi Max distance:  15'   Walk 10 feet activity   Assist     Assist level: Moderate Assistance - Patient - 50 - 74% Assistive device: Walker-hemi   Walk 50 feet activity   Assist Walk 50 feet with 2 turns activity did not occur: Safety/medical concerns (Patient unable to ambulate >15' and assess stair, curb, ramp mobility at this time secondary to reports of increased B knee pain, impaired endurance, decreased global strenght)         Walk 150 feet activity   Assist Walk 150 feet activity did not occur: Safety/medical concerns         Walk 10 feet on uneven surface  activity   Assist Walk 10 feet on uneven surfaces activity did not occur: Safety/medical concerns         Wheelchair     Assist Is the patient using a wheelchair?: Yes Type of Wheelchair: Manual    Wheelchair assist level: Maximal Assistance - Patient 25 - 49% Max wheelchair  distance: 150'    Wheelchair 50 feet with 2 turns activity    Assist        Assist Level: Moderate Assistance - Patient 50 - 74%   Wheelchair 150 feet activity     Assist      Assist Level: Maximal Assistance - Patient 25 - 49%   Blood pressure (!) 161/70, pulse 66, temperature 98 F (36.7 C), resp. rate 18, height _0  (1.803 m), weight 92 kg, SpO2 97 %.  Medical Problem List and Plan: 1. 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              -30 day cardiac event monitoring recommended as outpt by neuro             -patient may shower             -ELOS/Goals: 12-14 days, min assist goals with PT and OT  -Continue CIR therapies including PT, OT  2.  Antithrombotics: -DVT/anticoagulation:  Pharmaceutical: Lovenox start             -antiplatelet therapy: Plavix and aspirin daily for minimum 6 months 3. Pain Management: Tylenol as needed.  Has oxycodone as needed but not using             -See #13 below 4. Mood/Behavior/Sleep: LCSW to evaluate and team to provide emotional support             -antipsychotic agents: n/a             -Continue Cymbalta 60 mg every evening  -await neuropsych    5. Neuropsych/cognition: This patient is capable of making decisions on his own behalf. 6. Skin/Wound Care: Routine skin care checks 7. Fluids/Electrolytes/Nutrition: Strict I's and O's and follow-up chemistries             -2 g sodium diet 8: CAD status post PCI 10/31 overlapping DES to LAD             -Plavix and aspirin for at least 6 months             -Continue lipid lowering therapy             -Follow-up with Dr. Virgina Jock 9: PAD: Severe claudication status post bilateral lower extremity arteriogram             -Follow-up as outpatient with Dr. Virgina Jock 10: Heart failure with reduced ejection fraction:              -  Weigh patient daily             -Continue Imdur  30 mg daily             -Continue Norvasc 10 mg daily             -Continue Toprol-XL 25 mg daily  -need daily weights Filed Weights   07/10/22 1822 07/13/22 0438  Weight: 94.9 kg 92 kg    11: OAB: continue Myrbetriq, Flomax 12: Hyperlipidemia: Continue Lipitor 80 mg nightly             -Continue Zetia 10 mg daily 13: Low back pain/radiculopathy/neuropathy:              -Methadone 5 mg every other day             -Zanaflex 4 mg at nighttime -Following with Dr. Laurance Flatten -will schedule oxycodone 39m 3x per day as he says this was how he was using at home.  14: Tobacco use: Cessation counseling 15: Diabetes mellitus: CBGs before every meal, at bedtime.               -Continue Jardiance 10 mg before breakfast             -Continue sliding scale with meals  CBG (last 3)  Recent Labs    07/12/22 1624 07/12/22 2055 07/13/22 0546  GLUCAP 152* 136* 144*     -follow for consistent pattern 16: Gout: Continue colchicine 0.6 mg twice daily    LOS: 3 days A FACE TO FACE EVALUATION WAS PERFORMED  ALuanna SalkKirsteins 07/13/2022, 9:09 AM

## 2022-07-13 NOTE — Progress Notes (Signed)
Patient ID: Thomas [name], male   DOB: 23-Sep-1945, 76 y.o.   MRN: [id number] Met with the patient to review current situation, rehab process, team conference and plan of care. Focused chronic back pain, knee issues complicated by stroke.  Patient noted #40 wt loss piror to stroke; cutting back on meals. Reviewed secondary risks including HTN, HLD (LDL83/Trig 90), CAD, HF and smoking. Patient reported he has been trying to quit smoking. Reviewed DAPT x 6 mths per MD with dietary modifications. Patient expressed concern over fluid pills; keeping him up at [facility] toileting. Reported his wife needs a break, needs therapy to come to his home and telehealth if possible. Continue to follow along to discharge to address educational needs. Margarito Liner

## 2022-07-13 NOTE — IPOC Note (Signed)
Overall Plan of Care [facility]) Patient Details Name: Thomas [name] MRN: [id number] DOB: 07-01-1946  Admitting Diagnosis: Acute ischemic stroke [facility])  Hospital Problems: Principal Problem:   Acute ischemic stroke [facility])     Functional Problem List: Nursing Bladder, Safety, Pain, Medication Management, Endurance  PT Balance, Perception, Behavior, Safety, Sensory, Endurance, Motor, Pain  OT Balance, Endurance, Behavior  SLP Cognition  TR         Basic ADL's: OT Grooming, Bathing, Dressing, Toileting     Advanced  ADL's: OT Simple Meal Preparation     Transfers: PT Bed Mobility, Bed to Chair, Car  OT Tub/Shower, Toilet     Locomotion: PT Ambulation, Emergency planning/management officer, Stairs     Additional Impairments: OT    SLP Social Cognition   Problem Solving, Memory, Attention, Awareness  TR      Anticipated Outcomes Item Anticipated Outcome  Self Feeding s/u  Swallowing      Basic self-care  ModI  Toileting  ModI   Bathroom Transfers ModI  Bowel/Bladder  manage bowel and bladder w mod I  Transfers  Supv with LRAD  Locomotion  Supv for household distances with LRAD  Communication     Cognition  supervision  Pain  < 4 with prns  Safety/Judgment  manage w cues   Therapy Plan: PT Intensity: Minimum of 1-2 x/day ,45 to 90 minutes PT Frequency: 5 out of 7 days PT Duration Estimated Length of Stay: 10-14 OT Intensity: Minimum of 1-2 x/day, 45 to 90 minutes OT Frequency: 5 out of 7 days OT Duration/Estimated Length of Stay: 10-12 SLP Intensity: Minumum of 1-2 x/day, 30 to 90 minutes SLP Frequency: 3 to 5 out of 7 days SLP Duration/Estimated Length of Stay: 10-12 days   Team Interventions: Nursing Interventions Bladder Management, Patient/Family Education, Medication Management, Pain Management, Discharge Planning  PT interventions Ambulation/gait training, Community reintegration, DME/adaptive equipment instruction, Neuromuscular re-education, Psychosocial  support, Stair training, UE/LE Strength taining/ROM, Wheelchair propulsion/positioning, Training and development officer, Discharge planning, Functional electrical stimulation, Pain management, Skin care/wound management, Therapeutic Activities, UE/LE Coordination activities, Cognitive remediation/compensation, Disease management/prevention, Functional mobility training, Patient/family education, Splinting/orthotics, Therapeutic Exercise, Visual/perceptual remediation/compensation  OT Interventions Neuromuscular re-education, Patient/family education, UE/LE Strength taining/ROM, Therapeutic Exercise, Self Care/advanced ADL retraining  SLP Interventions Cognitive remediation/compensation, Cueing hierarchy, Internal/external aids, Patient/family education, Environmental controls  TR Interventions    SW/CM Interventions Discharge Planning, Psychosocial Support, Patient/Family Education, Disease Management/Prevention   Barriers to Discharge MD  Medical stability  Nursing Decreased caregiver support 1 level, elevator entry w spouse  PT Decreased caregiver support, Insurance for SNF coverage, Behavior    OT      SLP      SW Lack of/limited family support, Decreased caregiver support, Insurance underwriter for SNF coverage, Home environment access/layout     Team Discharge Planning: Destination: PT-Home ,OT- Home , SLP-Home Projected Follow-up: PT-Home health PT, OT-  Home health OT, SLP-Home Health SLP, 24 hour supervision/assistance Projected Equipment Needs: PT-To be determined, OT-  , SLP-None recommended by SLP Equipment Details: PT-TBD, OT-patient indicated that he has shower bench and BSC Patient/family involved in discharge planning: PT- Patient, Family member/caregiver,  OT-Patient, SLP-Patient  MD ELOS: 12-14d Medical Rehab Prognosis:  Good Assessment: The patient has been admitted for CIR therapies with the diagnosis of Right CVA. The team will be addressing functional mobility, strength, stamina,  balance, safety, adaptive techniques and equipment, self-care, bowel and bladder mgt, patient and caregiver education, monitor cardiac and renal status. Goals have been set at ModI/Sup. Anticipated  discharge destination is home with wife to assist.   See Team Conference Notes for weekly updates to the plan of care

## 2022-07-14 ENCOUNTER — Inpatient Hospital Stay (HOSPITAL_COMMUNITY): Payer: Medicare Other

## 2022-07-14 ENCOUNTER — Telehealth: Payer: Self-pay

## 2022-07-14 DIAGNOSIS — I639 Cerebral infarction, unspecified: Secondary | ICD-10-CM | POA: Diagnosis not present

## 2022-07-14 LAB — GLUCOSE, CAPILLARY
Glucose-Capillary: 131 mg/dL — ABNORMAL HIGH (ref 70–99)
Glucose-Capillary: 178 mg/dL — ABNORMAL HIGH (ref 70–99)
Glucose-Capillary: 244 mg/dL — ABNORMAL HIGH (ref 70–99)
Glucose-Capillary: 172 mg/dL — ABNORMAL HIGH (ref 70–99)

## 2022-07-14 NOTE — Progress Notes (Signed)
Physical Therapy Session Note  Patient Details  Name: Thomas [name] MRN: [id number] Date of Birth: Apr 26, 1946  Today's Date: 07/14/2022 PT Individual Time: 1007-1050 and 1640 - 1740  PT Individual Time Calculation (min): 43 min and 60 min   Short Term Goals: Week 1:  PT Short Term Goal 1 (Week 1): Patient will perform bed/chair transfer with LRAD and MinA PT Short Term Goal 2 (Week 1): Patient will ambulate 74' with LRAD and MinA PT Short Term Goal 3 (Week 1): Patient will initiate curb training for safety entry into home  Skilled Therapeutic Interventions/Progress Updates:    Session 1:  Pt greeted seated in [facility] and agreeable to therapy. C/o pain in L knee, 8/10. Nurse notified and arrived to administer medication.   Transported pt to and from room and main therapy gym via [facility] for time management.   Sit<> stand from [facility] to RW x 1 and WC to no AD x 2 with CGA.   Gait Training:  -174 feet with RW, L hand splint, and CGA. Two instances when pt states his L knee buckled, but no buckling seen or felt by SPT. Cues to improve upright posture and keeping RW closer to COM due to pt occasionally pushing it out too far in front of him. Pt responds well to cuing.  - 42 feet with no AD and L HHA with min assist for steadying. Pt showing decreased stride and step length and occasionally walking with a step to pattern (L stepping to R). Cues given to increase step and stride length and pt is hesitant due to fear of falling, occasionally improved stride length, but overall amb with a cautious gait pattern.   Pt left seated in WC, brakes on, posey belt on, call bell in reach, all needs met. Pain in L knee decreased to 4/10.   Session 2:  Pt greeted supine in bed and agreeable to therapy. C/o pain in his knee, did not rate, nurse notified and applied Volteren gel.   Transported pt to and from main therapy gym in [facility] for time management.   Sit<>stand from [facility] and EOM to RW with CGA throughout session with  education and practice of strapping and unstrapping L hand splint. Good carry over with strapping hand splint prior to standing, but poor carryover with unstrapping prior to sitting.   Gait training:  -135 ft with RW, CGA, and L hand splint on RW. Cues to improve upright posture, but improvement from last session. And cues for RW management, mainly with turning.  -40 ft with cone weaving, same AD and assistance level as above. Cues to keep RW close to COM, maintain upright posture, and take smaller steps when turning. Pt carried over cuing well and was able to navigate cones with minimal correction needed.  -38f with same AD and assistance level as above. 2 turns with good carry over from cone drill. One report of L knee "giving out", but not felt or observed by SPT.  -266fno AD and min assist with L HHA. Pt gait pattern presented as small steps and stride length, very slow gait speed, and increased caution. At 2055fhe pt stated his L knee was giving way and he started to squat down. SPT blocked L knee and provided mod assist for balance and support while PT placed chair behind pt.  LAQ performed with 5lb ankle weight x10 with 5 second hold L LE only for strengthening. Pt reporting no pain.   // bars:  B LE hip abd x 10, B LE hip flexion x 10, B UE support on bars and CGA from SPT. Pt showing increased difficulty with movement resulting in dragging of L LE compared to R LE due to weakness.     Pt left sitting in WC, brakes locked, call bell in reach, posey belt on, set up for dinner, all needs met.   Therapy Documentation Precautions:  Precautions Precautions: Fall Precaution Comments: L hemi Restrictions Weight Bearing Restrictions: No  Therapy/Group: Individual Therapy  Kayleen Memos, SPT 07/14/2022, 8:09 AM

## 2022-07-14 NOTE — Patient Outreach (Signed)
  [facility] Care Coordination Follow Up  07/14/2022 Name:  Thomas [name] MRN:  [id number] DOB:  [birth year]  Subjective: Thomas [name] is a 76 y.o. year old male who is a primary care patient of Haydee Salter, MD   An Emmi alert was received indicating patient responded to questions: Feeling worse overall?[name]  Upon chart review noted that patient currently in inpatient rehab.    Care Coordination Interventions Activated:  No  Care Coordination Interventions:  No, not indicated   Follow up plan:  RN CM will notify CMA to deactivate automated EMMI-Stroke calls.     Encounter Outcome: No outreach warranted.   Enzo Montgomery, RN,BSN,CCM [place] Management Telephonic Care Management Coordinator Direct Phone: [phone] Toll Free: [phone] Fax: [phone]

## 2022-07-14 NOTE — Progress Notes (Signed)
Speech Language Pathology Daily Session Note  Patient Details  Name: YUTO CAJUSTE MRN: [id number] Date of Birth: September 27, 1945  Today's Date: 07/14/2022 SLP Individual Time: 1335-1420 SLP Individual Time Calculation (min): 45 min  Short Term Goals: Week 1: SLP Short Term Goal 1 (Week 1): Pt will utilize memory compensatory aids to recall daily information with mod assist. SLP Short Term Goal 2 (Week 1): Pt will complete mildly complex familiar tasks with mod assist for functional problem solving. SLP Short Term Goal 3 (Week 1): Pt will recognize and correct errors during functional tasks wtih mod assist. SLP Short Term Goal 4 (Week 1): Pt will sustain his attention to mildly complex tasks for 10 minute intervals with min assist for redirection.  Skilled Therapeutic Interventions: Skilled treatment session focused on cognitive goals. SLP facilitated session by providing extra time and overall supervision level verbal cues for complex problem solving task in which he had to record appointments onto a calendar. Patient demonstrated sustained attention to task for ~20 minutes with Mod I. Patient also required extra time and Min verbal cues for problem solving while navigating his phone to locate pictures. Patient left upright in bed with alarm on and all needs within reach. Continue with current plan of care.      Pain Pain Assessment Pain Scale: 0-10 Pain Score: 0-No pain  Therapy/Group: Individual Therapy  Grover Robinson 07/14/2022, 3:55 PM

## 2022-07-14 NOTE — Progress Notes (Signed)
Occupational Therapy Session Note  Patient Details  Name: Thomas [name] MRN: [id number] Date of Birth: [birth year]  Today's Date: 07/14/2022 OT Individual Time: 2951-8841 OT Individual Time Calculation (min): 61 min    Short Term Goals: Week 1:  OT Short Term Goal 1 (Week 1): Pt will improve UB strength to 4+/5 MMT for safe and I task performance. OT Short Term Goal 2 (Week 1): Pt with complete UB/LB dressing with Ind OT Short Term Goal 3 (Week 1): Patient will complete all functional transfers with ModI  Skilled Therapeutic Interventions/Progress Updates:     Pt received in bed with nursing staff present returning from the bathroom. Pt receptive to participating in skilled OT session. Pt reporting mild pain in L hip refusing need for pain medications at this time.   Self Care: Pt supine to EOB min A to lift trunk with HOB elevated 20 degrees. Pt sat EOB with feet supported CGA to complete U/LB dressing. Pt donned shirt with set-up assist. Pt required mod A to don feet into pants and to bring to waist in standing. Sit>stand with RW min A during LB dressing. Pt required max A to don socks and shoes reporting he received assistance prior to hospitalization. Pt completed grooming tasks brushing hair and teeth wc level with stand by A. Pt educated on modified strategy for donning toothpaste with good teach back and receptive to education. Pt transported to therapy gym total A.  Therapeutic Activity: Pt completed piping activity seated in wc to increase functional use of LUE and facilitate grasp and release. Pt able to pick up light weight pieces of pipe dropping them ~25% of time. Pt able to lift pipe to shoulder level to put pieces together with min support provided under elbow. Moderate verbal cues required during task for Pt to fully extend fingers to release pipe pieces. Pt transported back to room total A and left resting in wc with call bell in reach, seat belt alarm on, and all needs met.    Therapy Documentation Precautions:  Precautions Precautions: Fall Precaution Comments: L hemi Restrictions Weight Bearing Restrictions: No General:   Vital Signs: Therapy Vitals Temp: 97.8 F (36.6 C) Temp Source: Oral Pulse Rate: 71 Resp: 18 BP: (Abnormal) 180/80 Patient Position (if appropriate): Lying Oxygen Therapy SpO2: 98 % O2 Device: Room Air Pain:   ADL: ADL Eating: Set up Where Assessed-Eating:  (based on observation) Grooming: Setup Where Assessed-Grooming: Sitting at sink Upper Body Bathing: Setup Where Assessed-Upper Body Bathing: Edge of bed Lower Body Bathing: Moderate assistance Where Assessed-Lower Body Bathing: Bed level Upper Body Dressing: Minimal assistance, Minimal cueing Where Assessed-Upper Body Dressing: Edge of bed Lower Body Dressing: Moderate assistance, Moderate cueing Where Assessed-Lower Body Dressing: Edge of bed (using the RW coming from sit to stand with the assistance of 2) Toileting: Moderate assistance (with the assistance of 2 people using a RW for additional balance.) Where Assessed-Toileting:  (based on observation of transferring from EOB to w/c) Toilet Transfer: Minimal assistance Toilet Transfer Method: Ambulating Toilet Transfer Equipment: Other (comment) Tub/Shower Transfer: Not assessed  Therapy/Group: Individual Therapy  Janey Genta 07/14/2022, 8:40 AM

## 2022-07-14 NOTE — Progress Notes (Addendum)
PROGRESS NOTE   Subjective/Complaints:  Working with OT this am , mild issues with sitting balance , leans to left , pt worried about wife.  SHe drives and she did visit him yesterday  Discussed rehab process  Left groin pain   ROS: Patient denies CP, SOB, N/V/D    Objective:   No results found. Recent Labs    07/13/22 0642  WBC 7.0  HGB 13.4  HCT 38.1*  PLT 221     Recent Labs    07/13/22 0642  NA 143  K 4.4  CL 108  CO2 23  GLUCOSE 141*  BUN 20  CREATININE 1.50*  CALCIUM 9.3     Intake/Output Summary (Last 24 hours) at 07/14/2022 0830 Last data filed at 07/14/2022 0815 Gross per 24 hour  Intake 360 ml  Output 600 ml  Net -240 ml         Physical Exam: Vital Signs Blood pressure (!) 180/80, pulse 71, temperature 97.8 F (36.6 C), temperature source Oral, resp. rate 18, height 5' 11" (1.803 m), weight 87.7 kg, SpO2 98 %.   General: No acute distress Mood and affect are appropriate Heart: Regular rate and rhythm no rubs murmurs or extra sounds Lungs: Clear to auscultation, breathing unlabored, no rales or wheezes Abdomen: Positive bowel sounds, soft nontender to palpation, nondistended Extremities: No clubbing, cyanosis, or edema Skin: No evidence of breakdown, no evidence of rash   Neuro:  fair insight and awareness. Normal language, sl dysarthria. Functional memory. RUE 5/5. LUE 3+/5 prox to distal. RLE 4/5 prox to distal. LLE 2/5 prox to 4-/5 distally. No focal sensory deficits. Decreased FMC of left hand and leg. No abnl tone. Mild dysmetria LUE  Musculoskeletal: Full ROM L knee, no effusion or erythema , pain with L hip int rotation, no pain with ext rot   Assessment/Plan: 1. Functional deficits which require 3+ hours per day of interdisciplinary therapy in a comprehensive inpatient rehab setting. Physiatrist is providing close team supervision and 24 hour management of active medical  problems listed below. Physiatrist and rehab team continue to assess barriers to discharge/monitor patient progress toward functional and medical goals  Care Tool:  Bathing    Body parts bathed by patient: Right arm, Left arm, Chest, Abdomen, Front perineal area, Right upper leg, Left upper leg, Face   Body parts bathed by helper: Right lower leg, Left lower leg, Buttocks     Bathing assist Assist Level: Minimal Assistance - Patient > 75%     Upper Body Dressing/Undressing Upper body dressing   What is the patient wearing?: Pull over shirt    Upper body assist Assist Level: Minimal Assistance - Patient > 75%    Lower Body Dressing/Undressing Lower body dressing      What is the patient wearing?: Underwear/pull up, Pants     Lower body assist Assist for lower body dressing: Moderate Assistance - Patient 50 - 74%     Toileting Toileting    Toileting assist Assist for toileting: Independent with assistive device Assistive Device Comment: urinal   Transfers Chair/bed transfer  Transfers assist     Chair/bed transfer assist level: Moderate Assistance - Patient 50 -  74%     Locomotion Ambulation   Ambulation assist      Assist level: Moderate Assistance - Patient 50 - 74% Assistive device: Walker-hemi Max distance: 15'   Walk 10 feet activity   Assist     Assist level: Moderate Assistance - Patient - 50 - 74% Assistive device: Walker-hemi   Walk 50 feet activity   Assist Walk 50 feet with 2 turns activity did not occur: Safety/medical concerns (Patient unable to ambulate >15' and assess stair, curb, ramp mobility at this time secondary to reports of increased B knee pain, impaired endurance, decreased global strenght)         Walk 150 feet activity   Assist Walk 150 feet activity did not occur: Safety/medical concerns         Walk 10 feet on uneven surface  activity   Assist Walk 10 feet on uneven surfaces activity did not occur:  Safety/medical concerns         Wheelchair     Assist Is the patient using a wheelchair?: Yes Type of Wheelchair: Manual    Wheelchair assist level: Maximal Assistance - Patient 25 - 49% Max wheelchair distance: 150'    Wheelchair 50 feet with 2 turns activity    Assist        Assist Level: Moderate Assistance - Patient 50 - 74%   Wheelchair 150 feet activity     Assist      Assist Level: Maximal Assistance - Patient 25 - 49%   Blood pressure (!) 180/80, pulse 71, temperature 97.8 F (36.6 C), temperature source Oral, resp. rate 18, height _0  (1.803 m), weight 87.7 kg, SpO2 98 %.  Medical Problem List and Plan: 1. 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              -30 day cardiac event monitoring recommended as outpt by neuro             -patient may shower             -ELOS/Goals: 12-14 days, min assist goals with PT and OT  -Continue CIR therapies including PT, OT  2.  Antithrombotics: -DVT/anticoagulation:  Pharmaceutical: Lovenox start             -antiplatelet therapy: Plavix and aspirin daily for minimum 6 months 3. Pain Management: Tylenol as needed.  Has oxycodone as needed but not using             -See #13 below 4. Mood/Behavior/Sleep: LCSW to evaluate and team to provide emotional support             -antipsychotic agents: n/a             -Continue Cymbalta 60 mg every evening  -await neuropsych    5. Neuropsych/cognition: This patient is capable of making decisions on his own behalf. 6. Skin/Wound Care: Routine skin care checks 7. Fluids/Electrolytes/Nutrition: Strict I's and O's and follow-up chemistries             -2 g sodium diet 8: CAD status post PCI 10/31 overlapping DES to LAD             -Plavix and aspirin for at least 6 months             -Continue lipid lowering therapy             -Follow-up with Dr. Virgina Jock 9:  PAD:  Severe claudication status post bilateral lower extremity arteriogram             -Follow-up as outpatient with Dr. Virgina Jock 10: Heart failure with reduced ejection fraction:              -Weigh patient daily             -Continue Imdur 30 mg daily             -Continue Norvasc 10 mg daily             -Continue Toprol-XL 25 mg daily  -need daily weights Filed Weights   07/10/22 1822 07/13/22 0438 07/14/22 0500  Weight: 94.9 kg 92 kg 87.7 kg    11: OAB: continue Myrbetriq, Flomax 12: Hyperlipidemia: Continue Lipitor 80 mg nightly             -Continue Zetia 10 mg daily 13: Low back pain/radiculopathy/neuropathy:              -Methadone 5 mg every other day, Was on this as OP, ? Prescriber              -Zanaflex 4 mg at nighttime -Following with Dr. Laurance Flatten -will schedule oxycodone 61m 3x per day as he says this was how he was using at home.  14: Tobacco use: Cessation counseling 15: Diabetes mellitus: CBGs before every meal, at bedtime.               -Continue Jardiance 10 mg before breakfast             -Continue sliding scale with meals  CBG (last 3)  Recent Labs    07/13/22 1635 07/13/22 2047 07/14/22 0607  GLUCAP 120* 146* 131*     -follow for consistent pattern 16: Gout: Continue colchicine 0.6 mg twice daily    LOS: 4 days A FACE TO FACE EVALUATION WAS PERFORMED  ALuanna SalkKirsteins 07/14/2022, 8:30 AM

## 2022-07-14 NOTE — Patient Outreach (Signed)
Received a red flag Emmi stroke notification for Thomas [name]. I have assigned Enzo Montgomery, RN to call for follow up and determine if there are any Case Management needs.    [facility], [place], [place] Management [phone]

## 2022-07-15 LAB — GLUCOSE, CAPILLARY
Glucose-Capillary: 183 mg/dL — ABNORMAL HIGH (ref 70–99)
Glucose-Capillary: 141 mg/dL — ABNORMAL HIGH (ref 70–99)
Glucose-Capillary: 192 mg/dL — ABNORMAL HIGH (ref 70–99)
Glucose-Capillary: 141 mg/dL — ABNORMAL HIGH (ref 70–99)

## 2022-07-15 MED ORDER — EMPAGLIFLOZIN 25 MG PO TABS
25.0000 mg | ORAL_TABLET | Freq: Every day | ORAL | Status: DC
  Administered 2022-07-16 – 2022-07-22 (×7): 25 mg via ORAL
  Filled 2022-07-15 (×7): qty 1

## 2022-07-15 NOTE — Progress Notes (Signed)
Patient ID: IRBY FAILS, male   DOB: 07-07-1946, 76 y.o.   MRN: [id number]  Rolling Walker ordered through [facility].

## 2022-07-15 NOTE — Discharge Instructions (Addendum)
Inpatient Rehab Discharge Instructions  VIREN LEBEAU Discharge date and time:  07/22/2022  Activities/Precautions/ Functional Status: Activity: no lifting, driving, or strenuous exercise until cleared by MD Diet: cardiac diet Wound Care: none needed Functional status:  ___ No restrictions     ___ Walk up steps independently ___ 24/7 supervision/assistance   ___ Walk up steps with assistance _x__ Intermittent supervision/assistance  ___ Bathe/dress independently ___ Walk with walker     ___ Bathe/dress with assistance ___ Walk Independently    ___ Shower independently ___ Walk with assistance    __x_ Shower with assistance _x__ No alcohol     ___ Return to work/school ________ Special Instructions: No driving, alcohol consumption or tobacco use.           STROKE/TIA DISCHARGE INSTRUCTIONS SMOKING Cigarette smoking nearly doubles your risk of having a stroke & is the single most alterable risk factor  If you smoke or have smoked in the last 12 months, you are advised to quit smoking for your health. Most of the excess cardiovascular risk related to smoking disappears within a year of stopping. Ask you doctor about anti-smoking medications [place] Quit Line: 1-800-QUIT NOW Free Smoking Cessation Classes [phone]  CHOLESTEROL Know your levels; limit fat & cholesterol in your diet  Lipid Panel     Component Value Date/Time   CHOL 129 07/09/2022 0230   TRIG 89 07/09/2022 0230   HDL 28 (L) 07/09/2022 0230   CHOLHDL 4.6 07/09/2022 0230   VLDL 18 07/09/2022 0230   LDLCALC 83 07/09/2022 0230     Many patients benefit from treatment even if their cholesterol is at goal. Goal: Total Cholesterol (CHOL) less than 160 Goal:  Triglycerides (TRIG) less than 150 Goal:  HDL greater than 40 Goal:  LDL (LDLCALC) less than 100   BLOOD PRESSURE American Stroke Association blood pressure target is less that 120/80 mm/Hg  Your discharge blood pressure is:  BP: (!) 173/76 Monitor your  blood pressure Limit your salt and alcohol intake Many individuals will require more than one medication for high blood pressure  DIABETES (A1c is a blood sugar average for last 3 months) Goal HGBA1c is under 7% (HBGA1c is blood sugar average for last 3 months)  Diabetes: Diagnosis of diabetes:  Your A1c:7.0 %    Lab Results  Component Value Date   HGBA1C 7.0 (H) 04/28/2022    Your HGBA1c can be lowered with medications, healthy diet, and exercise. Check your blood sugar as directed by your physician Call your physician if you experience unexplained or low blood sugars.  PHYSICAL ACTIVITY/REHABILITATION Goal is 30 minutes at least 4 days per week  Activity: Increase activity slowly, Therapies: Physical Therapy: Home Health, Occupational Therapy: Home Health, and Speech Therapy: Home Health Return to work: n/a Activity decreases your risk of heart attack and stroke and makes your heart stronger.  It helps control your weight and blood pressure; helps you relax and can improve your mood. Participate in a regular exercise program. Talk with your doctor about the best form of exercise for you (dancing, walking, swimming, cycling).  DIET/WEIGHT Goal is to maintain a healthy weight  Your discharge diet is:  Diet Order             Diet 2 gram sodium Room service appropriate? Yes; Fluid consistency: Thin  Diet effective now                  thin liquids Your height is:  Height: _0  (  180.3 cm) Your current weight is: Weight: 89.1 kg Your Body Mass Index (BMI) is:  BMI (Calculated): 27.41 Following the type of diet specifically designed for you will help prevent another stroke. Your goal weight range is:   Your goal Body Mass Index (BMI) is 19-24. Healthy food habits can help reduce 3 risk factors for stroke:  High cholesterol, hypertension, and excess weight.  RESOURCES Stroke/Support Group:  Call [phone]   STROKE EDUCATION PROVIDED/REVIEWED AND GIVEN TO PATIENT Stroke warning  signs and symptoms How to activate emergency medical system (call 911). Medications prescribed at discharge. Need for follow-up after discharge. Personal risk factors for stroke. Pneumonia vaccine given: No Flu vaccine given: No My questions have been answered, the writing is legible, and I understand these instructions.  I will adhere to these goals & educational materials that have been provided to me after my discharge from the hospital.      My questions have been answered and I understand these instructions. I will adhere to these goals and the provided educational materials after my discharge from the hospital.  Patient/Caregiver Signature _______________________________ Date __________  Clinician Signature _______________________________________ Date __________  Please bring this form and your medication list with you to all your follow-up doctor's appointments.

## 2022-07-15 NOTE — Progress Notes (Signed)
Occupational Therapy Session Note  Patient Details  Name: Thomas [name] MRN: [id number] Date of Birth: December 16, 1945  Today's Date: 07/15/2022 OT Individual Time: 1100-1130 OT Individual Time Calculation (min): 30 min  OT Individual Time: 1345-1445 OT Individual Time Calculation (min): 60 min    Short Term Goals: Week 1:  OT Short Term Goal 1 (Week 1): Pt will improve UB strength to 4+/5 MMT for safe and I task performance. OT Short Term Goal 2 (Week 1): Pt with complete UB/LB dressing with Ind OT Short Term Goal 3 (Week 1): Patient will complete all functional transfers with ModI  Skilled Therapeutic Interventions/Progress Updates:     Session 1: Pt received in room resting in bed willing to participate in therapy session with a focus on BADL retraining. Pt and OT discussed home dc plans and DME needs. Pt reporting he owns a tub bench, RW, BSC, and his bathroom is equip with grab bars to increase safety during transfers and BADLs.   Pt supine>sitting EOB CGA with HOB elevated. Pt donned shoes sitting EOB with set-up assist. Pt sit>stand with RW min A with moderate verbal required for hand placement and body mechanics. Pt ambulated to the sink min A RW and washed his face, brushed his teeth, and applied deodorant with CGA. Pt reported need to use the restroom ambulating to the bathroom with min A RW. Pt completed toileting with Mod A for clothing management. Pt ambulated back to sink and washed hands with min A RW. Pt ambulated to bed and returned to bed min A to lift legs. Pt required moderate verbal cues throughout session for RW management and safety in navigating room and bathroom. Pt was left resting in bed with call bell in reach, bed alarm on, and all needs met.   Session 2: Pt received resting in bed with wife present in room. In good spirits and not reporting pain during session. Pt receptive to shower and participating in skilled OT session. Pt supine>EOB CGA Hob elevated. Pt ambulated to  bathroom MIN A RW with min verbal cues for RW management. Pt completed toileting on bariatric BSC with min A. Pt doffed clothing CGA while sitting on BSC. Pt educated on shower safety and tub bench transfer. Pt ambulated to shower and transferred to tub bench with min A and min verbal cues. Pt completed U/LB bathing with CGA and min verbal cues for safety. Pt conned shirt sitting EOB with min verbal cueing on hemi-dressing technique. Pt donned brief min A to weave feet and maintain balance in standing to bring brief to waist. Pt educated on log rolling technique to facilitate increase independence in bed mobility. Pt EOB>supine min A to lift feet. Pt demonstrating decreased activity tolerance during session requiring increased time and intermittent rest breaks during session. Pt was left resting in bed with call bell in reach, bed alarm on, wife present, and all needs met.   Therapy Documentation Precautions:  Precautions Precautions: Fall Precaution Comments: L hemi Restrictions Weight Bearing Restrictions: No General:   Vital Signs: Therapy Vitals Temp: 98.8 F (37.1 C) Temp Source: Oral Pulse Rate: 64 Resp: 15 BP: (Abnormal) 151/55 Oxygen Therapy SpO2: 96 %   ADL: ADL Eating: Set up Where Assessed-Eating:  (based on observation) Grooming: Setup Where Assessed-Grooming: Sitting at sink Upper Body Bathing: Setup Where Assessed-Upper Body Bathing: Edge of bed Lower Body Bathing: Moderate assistance Where Assessed-Lower Body Bathing: Bed level Upper Body Dressing: Minimal assistance, Minimal cueing Where Assessed-Upper Body Dressing: Edge of bed  Lower Body Dressing: Moderate assistance, Moderate cueing Where Assessed-Lower Body Dressing: Edge of bed (using the RW coming from sit to stand with the assistance of 2) Toileting: Moderate assistance (with the assistance of 2 people using a RW for additional balance.) Where Assessed-Toileting:  (based on observation of transferring from  EOB to w/c) Toilet Transfer: Minimal assistance Toilet Transfer Method: Ambulating Toilet Transfer Equipment: Other (comment) Tub/Shower Transfer: Not assessed   Therapy/Group: Individual Therapy  Janey Genta 07/15/2022, 12:24 PM

## 2022-07-15 NOTE — Progress Notes (Signed)
Patient ID: Thomas [name], male   DOB: 29-Oct-1945, 76 y.o.   MRN: [id number]  Team Conference Report to Patient/Family  Team Conference discussion was reviewed with the patient and caregiver, including goals, any changes in plan of care and target discharge date.  Patient and caregiver express understanding and are in agreement.  The patient has a target discharge date of 07/22/22.  SW met with patient and provided conference updates. Patient aware of his upcoming discharge date, no questions or concerns.  [name] 07/15/2022, 1:16 PM

## 2022-07-15 NOTE — Progress Notes (Signed)
Physical Therapy Session Note  Patient Details  Name: Thomas [name] MRN: [id number] Date of Birth: [birth year]  Today's Date: 07/15/2022 PT Individual Time: 1301-1326 PT Individual Time Calculation (min): 25 min   Short Term Goals: Week 1:  PT Short Term Goal 1 (Week 1): Patient will perform bed/chair transfer with LRAD and MinA PT Short Term Goal 2 (Week 1): Patient will ambulate 55' with LRAD and MinA PT Short Term Goal 3 (Week 1): Patient will initiate curb training for safety entry into home  Skilled Therapeutic Interventions/Progress Updates:   Received pt sitting EOB with wife present at bedside. Pt agreeable to PT treatment and reported chronic pain in bilateral knees, back, and "all over legs" but did not state pain level and reported receiving pain meds already. Pt with questions regarding discharge date - informed pt of date of 11/15. Session with emphasis on functional mobility/transfers, generalized strengthening and endurance, and gait training. Donned shoes with max A and stood from EOB with RW and CGA. Pt ambulated 116f x 2 trials with RW and CGA to/from dayroom with 1 seated rest break. Pt ambulates with decreased cadence, narrow BOS, flexed trunk, and decreased L foot clearance but able to self-cue for postural correction. Concluded session with pt sitting in WC, needs within reach, and seatbelt alarm on. Wife present at bedside.   Therapy Documentation Precautions:  Precautions Precautions: Fall Precaution Comments: L hemi Restrictions Weight Bearing Restrictions: No  Therapy/Group: Individual Therapy AAlfonse AlpersPT, DPT 07/15/2022, 7:19 AM

## 2022-07-15 NOTE — Progress Notes (Signed)
Physical Therapy Session Note  Patient Details  Name: Thomas [name] MRN: [id number] Date of Birth: 1945/09/12  Today's Date: 07/15/2022 PT Individual Time: 3568-6168 PT Individual Time Calculation (min): 79 min   Short Term Goals: Week 1:  PT Short Term Goal 1 (Week 1): Patient will perform bed/chair transfer with LRAD and MinA PT Short Term Goal 2 (Week 1): Patient will ambulate 33' with LRAD and MinA PT Short Term Goal 3 (Week 1): Patient will initiate curb training for safety entry into home  Skilled Therapeutic Interventions/Progress Updates:     Pt greeted supine in bed and agreeable to therapy. No c/o pain upon therapy arrival.   Sup>sit with SPV and pt using R UE on bed railing. Pt able to sit EOB statically and dynamically with SPV. Total assist with donning socks and shoes for time management.   Stand pivot transfer to the L from EOB to [facility] with RW with CGA/min assist. Pt having multiple failed attempts at standing before being successful. Once standing pt pivots and sits with CGA.    Pt transported to and from main therapy gym via [facility] for time management.   Gait training:  150f, 1479f [id number] rest breaks between all sets) with CGA, RW (used L hand splint for first set, then removed for remaining sets and pt was able to maintain L hand on RW with no cuing needed). Pt demos good reciprocal pattern and step length, minimal cuing needed for keeping RW closer and to maintain upright posture.  Occasional reports of L knee pain and buckling, but pt able to continue without increased assistance and reports he does not need to sit down.   Practiced 4 inch step up per home set up of small threshold to enter home. SPT demonstrated and educated first using RW and explantation of sequencing. Pt then performed ascending and descending x4 with CGA and RW, minimal cues for correct foot to step up and down with. Pt had one instance of reported L knee buckling when stepping up with  right foot, but was able to remain balanced without increased assistance.   Sit<>stand from EOM and WC throughout session with CGA and no cues needed.   Sit<>stand from recliner in simulation apartment to simulate home recliner x3 with CGA.   Pt then stated he needed to use to bathroom, so ambulated ~15ft64fnearest bathroom, unable to manage his pants and underwear in time, which resulted incontinence of urine. Max assist with peri care and doffing of dirty laundry and donning of new brief and clean pants.   Transported pt back to [facility].  Stand pivot [facility] the [facility] and CGA. Good hand placement and sequencing.  Sit>supine with SPV and pt use of R UE on bed railing. Nursing arrived for glucose check.  Pt left supine in bed with bed alarm on, cal bell in reach, all needs met.   Therapy Documentation Precautions:  Precautions Precautions: Fall Precaution Comments: L hemi Restrictions Weight Bearing Restrictions: No  Therapy/Group: Individual Therapy  Malai Lady Kayleen Memos 07/15/2022, 11:31 AM

## 2022-07-15 NOTE — Progress Notes (Addendum)
PROGRESS NOTE   Subjective/Complaints:  No issues overnite   ROS: Patient denies CP, SOB, N/V/D    Objective:   DG HIP UNILAT WITH PELVIS 2-3 VIEWS LEFT  Result Date: 07/14/2022 CLINICAL DATA:  Left hip pain. EXAM: DG HIP (WITH OR WITHOUT PELVIS) 2-3V LEFT COMPARISON:  None Available. FINDINGS: The left hip is normally located. Mild age related degenerative changes. No plain film evidence of stress fracture or AVN. Moderate vascular calcifications. IMPRESSION: Mild age related degenerative changes but no acute bony findings. Electronically Signed   By: Marijo Sanes M.D.   On: 07/14/2022 12:59   Recent Labs    07/13/22 0642  WBC 7.0  HGB 13.4  HCT 38.1*  PLT 221     Recent Labs    07/13/22 0642  NA 143  K 4.4  CL 108  CO2 23  GLUCOSE 141*  BUN 20  CREATININE 1.50*  CALCIUM 9.3     Intake/Output Summary (Last 24 hours) at 07/15/2022 5701 Last data filed at 07/15/2022 0733 Gross per 24 hour  Intake 720 ml  Output 500 ml  Net 220 ml         Physical Exam: Vital Signs Blood pressure (!) 163/72, pulse 65, temperature 98.2 F (36.8 C), temperature source Oral, resp. rate 16, height _0  (1.803 m), weight 92.4 kg, SpO2 94 %.   General: No acute distress Mood and affect are appropriate Heart: Regular rate and rhythm no rubs murmurs or extra sounds Lungs: Clear to auscultation, breathing unlabored, no rales or wheezes Abdomen: Positive bowel sounds, soft nontender to palpation, nondistended Extremities: No clubbing, cyanosis, or edema Skin: No evidence of breakdown, no evidence of rash   Neuro:  fair insight and awareness. Normal language, sl dysarthria. Functional memory. RUE 5/5. LUE 3+/5 prox to distal. RLE 4/5 prox to distal. LLE 2/5 prox to 4-/5 distally. No LTsensory deficits but has reduced proprioception in the left hand . Decreased FMC of left hand and leg. No abnl tone. Mild dysmetria LUE   Musculoskeletal: Full ROM L knee, no effusion or erythema , pain with L hip int rotation, no pain with ext rot   Assessment/Plan: 1. Functional deficits which require 3+ hours per day of interdisciplinary therapy in a comprehensive inpatient rehab setting. Physiatrist is providing close team supervision and 24 hour management of active medical problems listed below. Physiatrist and rehab team continue to assess barriers to discharge/monitor patient progress toward functional and medical goals  Care Tool:  Bathing    Body parts bathed by patient: Right arm, Left arm, Chest, Abdomen, Front perineal area, Right upper leg, Left upper leg, Face   Body parts bathed by helper: Right lower leg, Left lower leg, Buttocks     Bathing assist Assist Level: Minimal Assistance - Patient > 75%     Upper Body Dressing/Undressing Upper body dressing   What is the patient wearing?: Pull over shirt    Upper body assist Assist Level: Minimal Assistance - Patient > 75%    Lower Body Dressing/Undressing Lower body dressing      What is the patient wearing?: Underwear/pull up, Pants     Lower body assist Assist for  lower body dressing: Moderate Assistance - Patient 50 - 74%     Toileting Toileting    Toileting assist Assist for toileting: Independent with assistive device Assistive Device Comment: urinal   Transfers Chair/bed transfer  Transfers assist     Chair/bed transfer assist level: Minimal Assistance - Patient > 75% Chair/bed transfer assistive device: Armrests, Programmer, multimedia   Ambulation assist      Assist level: Contact Guard/Touching assist Assistive device: Walker-rolling Max distance: 161f   Walk 10 feet activity   Assist     Assist level: Moderate Assistance - Patient - 50 - 74% Assistive device: Walker-hemi   Walk 50 feet activity   Assist Walk 50 feet with 2 turns activity did not occur: Safety/medical concerns (Patient unable to  ambulate >15' and assess stair, curb, ramp mobility at this time secondary to reports of increased B knee pain, impaired endurance, decreased global strenght)         Walk 150 feet activity   Assist Walk 150 feet activity did not occur: Safety/medical concerns         Walk 10 feet on uneven surface  activity   Assist Walk 10 feet on uneven surfaces activity did not occur: Safety/medical concerns         Wheelchair     Assist Is the patient using a wheelchair?: Yes Type of Wheelchair: Manual    Wheelchair assist level: Maximal Assistance - Patient 25 - 49% Max wheelchair distance: 150'    Wheelchair 50 feet with 2 turns activity    Assist        Assist Level: Moderate Assistance - Patient 50 - 74%   Wheelchair 150 feet activity     Assist      Assist Level: Maximal Assistance - Patient 25 - 49%   Blood pressure (!) 163/72, pulse 65, temperature 98.2 F (36.8 C), temperature source Oral, resp. rate 16, height _0  (1.803 m), weight 92.4 kg, SpO2 94 %.  Medical Problem List and Plan: 1. 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              -30 day cardiac event monitoring recommended as outpt by neuro             -patient may shower             -ELOS/Goals: 12-14 days, min assist goals with PT and OT, Team conference today please see physician documentation under team conference tab, met with team  to discuss problems,progress, and goals. Formulized individual treatment plan based on medical history, underlying problem and comorbidities.   -Continue CIR therapies including PT, OT  2.  Antithrombotics: -DVT/anticoagulation:  Pharmaceutical: Lovenox start             -antiplatelet therapy: Plavix and aspirin daily for minimum 6 months 3. Pain Management: Tylenol as needed.  Has oxycodone as needed but not using             -See #13 below  Hip  pain no evidence of sig OA or fx on xray  4. Mood/Behavior/Sleep: LCSW to evaluate and team to provide emotional support             -antipsychotic agents: n/a             -Continue Cymbalta 60 mg every evening  -await neuropsych    5. Neuropsych/cognition: This patient is capable of making decisions on  his own behalf. 6. Skin/Wound Care: Routine skin care checks 7. Fluids/Electrolytes/Nutrition: Strict I's and O's and follow-up chemistries             -2 g sodium diet 8: CAD status post PCI 10/31 overlapping DES to LAD             -Plavix and aspirin for at least 6 months             -Continue lipid lowering therapy             -Follow-up with Dr. Virgina Jock 9: PAD: Severe claudication status post bilateral lower extremity arteriogram             -Follow-up as outpatient with Dr. Virgina Jock 10: Heart failure with reduced ejection fraction:              -Weigh patient daily             -Continue Imdur 30 mg daily             -Continue Norvasc 10 mg daily             -Continue Toprol-XL 25 mg daily  -need daily weights Filed Weights   07/13/22 0438 07/14/22 0500 07/15/22 0500  Weight: 92 kg 87.7 kg 92.4 kg    11: OAB: continue Myrbetriq, Flomax 12: Hyperlipidemia: Continue Lipitor 80 mg nightly             -Continue Zetia 10 mg daily 13: Low back pain/radiculopathy/neuropathy:              -Methadone 5 mg every other day, Was on this as OP, ? Prescriber              -Zanaflex 4 mg at nighttime -Following with Dr. Laurance Flatten -will schedule oxycodone 51m 3x per day as he says this was how he was using at home.  14: Tobacco use: Cessation counseling 15: Diabetes mellitus: CBGs before every meal, at bedtime.               -Continue Jardiance 10 mg before breakfast             -Continue sliding scale with meals  CBG (last 3)  Recent Labs    07/14/22 1622 07/14/22 2211 07/15/22 0631  GLUCAP 244* 172* 183*   Increase Jardiance to 281m 16: Gout: Continue colchicine 0.6 mg twice  daily    LOS: 5 days A FACE TO FACE EVALUATION WAS PERFORMED  AnCharlett Blake1/04/2022, 8:33 AM

## 2022-07-15 NOTE — Patient Care Conference (Signed)
Inpatient RehabilitationTeam Conference and Plan of Care Update Date: 07/15/2022   Time: 10:46 AM    Patient Name: Thomas [name]      Medical Record Number: [id number]  Date of Birth: 01/12/1946 Sex: Male         Room/Bed: 4W21C/4W21C-01 Payor Info: Payor: MEDICARE / Plan: MEDICARE PART A AND B / Product Type: *No Product type* /    Admit Date/Time:  07/10/2022  6:16 PM  Primary Diagnosis:  Acute ischemic stroke [facility])  Hospital Problems: Principal Problem:   Acute ischemic stroke [facility])    Expected Discharge Date: Expected Discharge Date: 07/22/22  Team Members Present: Physician leading conference: Dr. Alysia Penna Social Worker Present: Erlene Quan, BSW Nurse Present: Dorien Chihuahua, RN PT Present: Page Spiro, PT OT Present: Other (comment) Lowella Fairy, OT) SLP Present: Sherren Kerns, SLP PPS Coordinator present : Gunnar Fusi, SLP     Current Status/Progress Goal Weekly Team Focus  Bowel/Bladder   Patient is continent x2.   Remain continent.   Monitor for bowel and bladder changes.    Swallow/Nutrition/ Hydration               ADL's   U/LB bathing min A, UB dressing CGA, LB dressing mod A, toileting Mod A, toilet transfer min A stand pivot, shower transfer min A stand pivot   Mod I   NMR-education, ADL retraining, transfer training, activity tolerance, functional mobility, safety awareness    Mobility   Decreased attention to L UE, occasional knee buckling with amb, CGA bed mobiliy and transfers, CGA/min assist for gait (up to 139f with RW), min/mod assist 4 steps with B HR.   SPV at an ambulatory level with LRAD  curb training, gait training, dynamic balance, endurance, transfer training, bed mobility, L hemibody attention and NMR    Communication                Safety/Cognition/ Behavioral Observations  Mod I-to-min A   sup A   problem solving, functional recall, sustained attention, awareness    Pain   Denies pain.   Pain of 0.    Assess pain q shift prn.    Skin   CDI.   No skin breakdown.  Assess skin q shift prn.      Discharge Planning:  Discharging home with spouse to assist sup/minA   Team Discussion: Patient with decreased proprioception post stroke with new hip pain left knee pain and buckling with balance issues.   Xray hip negative, chronic back pain managed with scheduled medications. MD adjusted meds for diabetes management.  Patient on target to meet rehab goals: yes, currently needs min assist for bathing and CGA for dressing. NEeds mod assist for lower [place] due to balance issues. Completes stand pivots with min - mod assist and a RW. Able to ambulate up to 170' with CGA using a RW. Currently mod I for problem solving, recall, error awareness with supervision goals set for discharge.   *See Care Plan and progress notes for long and short-term goals.   Revisions to Treatment Plan:  N/a   Teaching Needs: Safety, medications, dietary modifications, transfers, toileting, etc  Current Barriers to Discharge: Lack of/limited family support  Possible Resolutions to Barriers: Family education OP follow up services DME: RW     Medical Summary Current Status: uncontrolled diabetes, previous management with oral meds only, chronic pain issues on long term opioids at home , new Left sided weakness and sensory symptoms post CVA  Barriers to  Discharge: Medical stability;Other (comments)  Barriers to Discharge Comments: chronic low back pain Possible Resolutions to [employer] Focus: med managment for DM, pain, incorporate non pharmacologic options   Continued Need for Acute Rehabilitation Level of Care: The patient requires daily medical management by a physician with specialized training in physical medicine and rehabilitation for the following reasons: Direction of a multidisciplinary physical rehabilitation program to maximize functional independence : Yes Medical management of patient  stability for increased activity during participation in an intensive rehabilitation regime.: Yes Analysis of laboratory values and/or radiology reports with any subsequent need for medication adjustment and/or medical intervention. : Yes   I attest that I was present, lead the team conference, and concur with the assessment and plan of the team.   Dorien Chihuahua B 07/15/2022, 3:11 PM

## 2022-07-16 DIAGNOSIS — G4700 Insomnia, unspecified: Secondary | ICD-10-CM

## 2022-07-16 DIAGNOSIS — I5022 Chronic systolic (congestive) heart failure: Secondary | ICD-10-CM

## 2022-07-16 DIAGNOSIS — I25810 Atherosclerosis of coronary artery bypass graft(s) without angina pectoris: Secondary | ICD-10-CM

## 2022-07-16 LAB — GLUCOSE, CAPILLARY
Glucose-Capillary: 167 mg/dL — ABNORMAL HIGH (ref 70–99)
Glucose-Capillary: 146 mg/dL — ABNORMAL HIGH (ref 70–99)
Glucose-Capillary: 239 mg/dL — ABNORMAL HIGH (ref 70–99)
Glucose-Capillary: 167 mg/dL — ABNORMAL HIGH (ref 70–99)

## 2022-07-16 MED ORDER — TRAZODONE HCL 50 MG PO TABS
75.0000 mg | ORAL_TABLET | Freq: Every day | ORAL | Status: DC
  Administered 2022-07-16 – 2022-07-21 (×6): 75 mg via ORAL
  Filled 2022-07-16 (×6): qty 2

## 2022-07-16 NOTE — Progress Notes (Signed)
Occupational Therapy Session Note  Patient Details  Name: Thomas [name] MRN: [id number] Date of Birth: 05-21-1946  Today's Date: 07/16/2022 OT Individual Time: 0761-5183 OT Individual Time Calculation (min): 55 min   Short Term Goals: Week 1:  OT Short Term Goal 1 (Week 1): Pt will improve UB strength to 4+/5 MMT for safe and I task performance. OT Short Term Goal 2 (Week 1): Pt with complete UB/LB dressing with Ind OT Short Term Goal 3 (Week 1): Patient will complete all functional transfers with ModI  Skilled Therapeutic Interventions/Progress Updates:     Pt received in wc with nursing staff present assisting Pt in finishing up morning BADLs. Pt dressed and ready for therapy session with all BADLs met. Pt reporting no pain during session. Pt transported to therapy gym total A with Pt able to maintain grasp on RW with BUE during transportation.  Pt completed UE AROM exercises and stretches seated in his wheelchair in preparation for functional activities. Pt LUE grip strength 3+/5, bicep 4/5, tricep 4/5, and shoulder 3/5.  Pt compelted BIMs activities (bell cancellation, trail making, VM tracing ) to practice targeted reach with LUE, increase VM LUE control, and improve visual scanning. Pt able to reach shoulder level during activities with min support provided at shoulder to reach above eye level. Mod HOH assist provided intermittently during tasks to facilitate finger extension of the 2nd digit and flexion of the 3rd, 4th, and 5th. Pt able to maintain standing balance for 4-8 minutes during tasks with short rest breaks provided.  Pt ambulated to EOB with rw CGA and min verbal cues for RW management. Pt completed seated UE exercises with 4# weighted bar to facilitate reciprocal BUE movements- bicep curls, chest press, and anterior shoulder raises 2x10 reps. Min hand under hand assist provided on L hand to maintain grasp on bar.  Pt transported back to room in wc total A and left resting in his  wc with call bell in reach, seat belt alarm on, and all needs met.   Therapy Documentation Precautions:  Precautions Precautions: Fall Precaution Comments: L hemi Restrictions Weight Bearing Restrictions: No   ADL: ADL Eating: Set up Where Assessed-Eating:  (based on observation) Grooming: Setup Where Assessed-Grooming: Sitting at sink Upper Body Bathing: Setup Where Assessed-Upper Body Bathing: Edge of bed Lower Body Bathing: Moderate assistance Where Assessed-Lower Body Bathing: Bed level Upper Body Dressing: Minimal assistance, Minimal cueing Where Assessed-Upper Body Dressing: Edge of bed Lower Body Dressing: Moderate assistance, Moderate cueing Where Assessed-Lower Body Dressing: Edge of bed (using the RW coming from sit to stand with the assistance of 2) Toileting: Moderate assistance (with the assistance of 2 people using a RW for additional balance.) Where Assessed-Toileting:  (based on observation of transferring from EOB to w/c) Toilet Transfer: Minimal assistance Toilet Transfer Method: Ambulating Toilet Transfer Equipment: Other (comment) Tub/Shower Transfer: Not assessed   Therapy/Group: Individual Therapy  Janey Genta 07/16/2022, 10:46 AM

## 2022-07-16 NOTE — Progress Notes (Signed)
Speech Language Pathology Daily Session Note  Patient Details  Name: Thomas [name] MRN: [id number] Date of Birth: 1946/06/14  Today's Date: 07/16/2022 SLP Individual Time: 0900-0930 SLP Individual Time Calculation (min): 30 min  Short Term Goals: Week 1: SLP Short Term Goal 1 (Week 1): Pt will utilize memory compensatory aids to recall daily information with mod assist. SLP Short Term Goal 2 (Week 1): Pt will complete mildly complex familiar tasks with mod assist for functional problem solving. SLP Short Term Goal 3 (Week 1): Pt will recognize and correct errors during functional tasks wtih mod assist. SLP Short Term Goal 4 (Week 1): Pt will sustain his attention to mildly complex tasks for 10 minute intervals with min assist for redirection.  Skilled Therapeutic Interventions: Skilled ST treatment focused on cognitive goals. SLP facilitated session by providing mod A for functional problem solving with "solving daily math problems" subtest via the ALFA in order for pt to achieve 4/8 accuracy; mod A to recognize errors. Unable to complete due to time constraints. When pt exhibited first exhibited difficulty with task, he appeared to minimize errors and stated "this is a waste of my time." However following further discussion on functionality of task and relating it to his personal life, pt appeared more open/receptive and acknowledged cognitive changes s/p CVA. Pt sustained attention to task for ~12 minutes with min A verbal redirection cues. Patient was left in wheelchair with alarm activated and immediate needs within reach at end of session. Continue per current plan of care.      Pain  None/denied  Therapy/Group: Individual Therapy  Temari Schooler T Zadok Holaway 07/16/2022, 9:12 AM

## 2022-07-16 NOTE — Progress Notes (Signed)
PROGRESS NOTE   Subjective/Complaints:  Reports had poor sleep last night. No additional concerns.   ROS: Patient denies CP, SOB, N/V/D, headache, abdominal pain Positive for insomnia    Objective:   No results found. No results for input(s): "WBC", "HGB", "HCT", "PLT" in the last 72 hours.   No results for input(s): "NA", "K", "CL", "CO2", "GLUCOSE", "BUN", "CREATININE", "CALCIUM" in the last 72 hours.   Intake/Output Summary (Last 24 hours) at 07/16/2022 1443 Last data filed at 07/16/2022 1252 Gross per 24 hour  Intake 660 ml  Output 750 ml  Net -90 ml         Physical Exam: Vital Signs Blood pressure 138/65, pulse 66, temperature 98.7 F (37.1 C), temperature source Oral, resp. rate 17, height _0  (1.803 m), weight 92.4 kg, SpO2 95 %.   General: No acute distress, seen at bedside Mood and affect are appropriate, pleasant Heart: Regular rate and rhythm no rubs murmurs or extra sounds Lungs: Clear to auscultation, breathing unlabored, no rales or wheezes, nonlabored breathing Abdomen: Positive bowel sounds, soft nontender to palpation, nondistended Extremities: No clubbing, cyanosis, or edema Skin: Warm dry no breakdown noted   Neuro:  fair insight and awareness. Normal language, sl dysarthria. Functional memory. RUE 5/5. LUE 3+/5 prox to distal. RLE 4+/5 prox to distal. LLE 2/5 prox to 4/5 distally. No LTsensory deficits but has reduced proprioception in the left hand . Decreased FMC of left hand and leg. No abnl tone. Mild dysmetria LUE  Musculoskeletal: Full ROM L knee, no effusion or erythema , pain with L hip int rotation, no pain with ext rot   Assessment/Plan: 1. Functional deficits which require 3+ hours per day of interdisciplinary therapy in a comprehensive inpatient rehab setting. Physiatrist is providing close team supervision and 24 hour management of active medical problems listed  below. Physiatrist and rehab team continue to assess barriers to discharge/monitor patient progress toward functional and medical goals  Care Tool:  Bathing    Body parts bathed by patient: Right arm, Left arm, Chest, Abdomen, Front perineal area, Right upper leg, Left upper leg, Face   Body parts bathed by helper: Right lower leg, Left lower leg, Buttocks     Bathing assist Assist Level: Minimal Assistance - Patient > 75%     Upper Body Dressing/Undressing Upper body dressing   What is the patient wearing?: Pull over shirt    Upper body assist Assist Level: Minimal Assistance - Patient > 75%    Lower Body Dressing/Undressing Lower body dressing      What is the patient wearing?: Underwear/pull up, Pants     Lower body assist Assist for lower body dressing: Moderate Assistance - Patient 50 - 74%     Toileting Toileting    Toileting assist Assist for toileting: Independent with assistive device Assistive Device Comment: urinal   Transfers Chair/bed transfer  Transfers assist     Chair/bed transfer assist level: Minimal Assistance - Patient > 75% Chair/bed transfer assistive device: Armrests, Programmer, multimedia   Ambulation assist      Assist level: Contact Guard/Touching assist Assistive device: Walker-rolling Max distance: 163f   Walk 10 feet  activity   Assist     Assist level: Moderate Assistance - Patient - 50 - 74% Assistive device: Walker-hemi   Walk 50 feet activity   Assist Walk 50 feet with 2 turns activity did not occur: Safety/medical concerns (Patient unable to ambulate >15' and assess stair, curb, ramp mobility at this time secondary to reports of increased B knee pain, impaired endurance, decreased global strenght)         Walk 150 feet activity   Assist Walk 150 feet activity did not occur: Safety/medical concerns         Walk 10 feet on uneven surface  activity   Assist Walk 10 feet on uneven surfaces  activity did not occur: Safety/medical concerns         Wheelchair     Assist Is the patient using a wheelchair?: Yes Type of Wheelchair: Manual    Wheelchair assist level: Maximal Assistance - Patient 25 - 49% Max wheelchair distance: 150'    Wheelchair 50 feet with 2 turns activity    Assist        Assist Level: Moderate Assistance - Patient 50 - 74%   Wheelchair 150 feet activity     Assist      Assist Level: Maximal Assistance - Patient 25 - 49%   Blood pressure 138/65, pulse 66, temperature 98.7 F (37.1 C), temperature source Oral, resp. rate 17, height _0  (1.803 m), weight 92.4 kg, SpO2 95 %.  Medical Problem List and Plan: 1. 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              -30 day cardiac event monitoring recommended as outpt by neuro             -patient may shower             -ELOS/Goals: 07/22/2022, supervision/mod I  -Continue CIR therapies including PT, OT and SLP 2.  Antithrombotics: -DVT/anticoagulation:  Pharmaceutical: Lovenox start             -antiplatelet therapy: Plavix and aspirin daily for minimum 6 months 3. Pain Management: Tylenol as needed.  Has oxycodone as needed but not using             -See #13 below  Hip pain no evidence of sig OA or fx on xray  4. Mood/Behavior/Sleep: LCSW to evaluate and team to provide emotional support             -antipsychotic agents: n/a             -Continue Cymbalta 60 mg every evening  -await neuropsych    5. Neuropsych/cognition: This patient is capable of making decisions on his own behalf. 6. Skin/Wound Care: Routine skin care checks 7. Fluids/Electrolytes/Nutrition: Strict I's and O's and follow-up chemistries             -2 g sodium diet 8: CAD status post PCI 10/31 overlapping DES to LAD             -Plavix and aspirin for at least 6 months             -Continue lipid  lowering therapy             -Follow-up with Dr. Virgina Jock  -Denies chest pain continue to monitor 9: PAD: Severe claudication status post bilateral lower extremity arteriogram             -Follow-up as  outpatient with Dr. Virgina Jock 10: Heart failure with reduced ejection fraction:              -Weigh patient daily             -Continue Imdur 30 mg daily             -Continue Norvasc 10 mg daily             -Continue Toprol-XL 25 mg daily  -11/9 daily weights appear to be stable, continue to monitor Filed Weights   07/13/22 0438 07/14/22 0500 07/15/22 0500  Weight: 92 kg 87.7 kg 92.4 kg    11: OAB: continue Myrbetriq, Flomax 12: Hyperlipidemia: Continue Lipitor 80 mg nightly             -Continue Zetia 10 mg daily 13: Low back pain/radiculopathy/neuropathy:              -Methadone 5 mg every other day, Was on this as OP, ? Prescriber              -Zanaflex 4 mg at nighttime -Following with Dr. Laurance Flatten -will schedule oxycodone 31m 3x per day as he says this was how he was using at home.  14: Tobacco use: Cessation counseling 15: Diabetes mellitus: CBGs before every meal, at bedtime.               -Continue Jardiance 10 mg before breakfast             -Continue sliding scale with meals  CBG (last 3)  Recent Labs    07/15/22 2117 07/16/22 0633 07/16/22 1142  GLUCAP 141* 167* 146*   Increase Jardiance to 248m -11/9 CBG have been  better controlled, continue current medications 16: Gout: Continue colchicine 0.6 mg twice daily 17: Insomnia  -11/9 has been taking 50 mg as needed trazodone, I will schedule 75 mg dose  LOS: 6 days A FACE TO FACE EVALUATION WAS PERFORMED  YuJennye Boroughs1/05/2022, 2:43 PM

## 2022-07-16 NOTE — Progress Notes (Signed)
Physical Therapy Session Note  Patient Details  Name: Thomas [name] MRN: [id number] Date of Birth: December 10, 1945  Today's Date: 07/16/2022 PT Individual Time: 4383-8184 PT Individual Time Calculation (min): 70 min   Short Term Goals: Week 1:  PT Short Term Goal 1 (Week 1): Patient will perform bed/chair transfer with LRAD and MinA PT Short Term Goal 2 (Week 1): Patient will ambulate 51' with LRAD and MinA PT Short Term Goal 3 (Week 1): Patient will initiate curb training for safety entry into home  Skilled Therapeutic Interventions/Progress Updates:   Pt received supine in bed and agreeable to PT. Supine>sit transfer with supervision and cues for midline once sitting EOB. P treports need for uraintion. Min assist for pt to manage clothing and prevent LOB to the L. Pt noted to have been incontinent of bladder prior getting urinal in place. Pt the voided additional 20cc in urinal. Doffing/donning pants sitting EOB with max assist to navigate shoes with cues for awareness of L/Posterior LOB while sitting EOB. Pt able to correct with cues from PT. Stand pivot transfer to [facility] with min assist due to poor foot clearance of LLE in turn. Pt transported to rehab gym in [facility]. Pt pefrormed gait training with RW x 184f in rehab gym with supervision assist and 2470fwith supervision assist. Pt reports mild knee instabilty but not seen by PT. Dynamic gait training with RW to weave through 6 cones x 2 with cues for AD management stepping over bar weight on floor 6 x 2 with cues for safety to be closer to bar prior to stepping over and improved AD management. Nustep reciprocal movement training 5 min +2 min with therapeutic rest break and min cues for retention of target completion time. Milld knee pain upon completion, but not rated by pt. [facility] returned to [facility] and [facility] reach and all needs met.         Therapy Documentation Precautions:  Precautions Precautions: Fall Precaution  Comments: L hemi Restrictions Weight Bearing Restrictions: No    Vital Signs: Therapy Vitals Temp: 98.7 F (37.1 C) Temp Source: Oral Pulse Rate: 66 Resp: 17 BP: 138/65 Patient Position (if appropriate): Lying Oxygen Therapy SpO2: 95 % O2 Device: Room Air Pain: Pain Assessment Pain Scale: 0-10 Pain Score: 4  Pain Location: Knee Pain Orientation: Left Pain Descriptors / Indicators: Constant;Aching Pain Onset: On-going Patients Stated Pain Goal: 0 Pain Intervention(s): Ambulation/increased activity;Repositioned   Therapy/Group: Individual Therapy  AuLorie Phenix1/05/2022, 4:11 PM

## 2022-07-16 NOTE — Progress Notes (Signed)
Physical Therapy Session Note  Patient Details  Name: Thomas [name] MRN: [id number] Date of Birth: May 28, 1946  Today's Date: 07/16/2022 PT Individual Time: 1304-1405 PT Individual Time Calculation (min): 61 min   Short Term Goals: Week 1:  PT Short Term Goal 1 (Week 1): Patient will perform bed/chair transfer with LRAD and MinA PT Short Term Goal 2 (Week 1): Patient will ambulate 65' with LRAD and MinA PT Short Term Goal 3 (Week 1): Patient will initiate curb training for safety entry into home  Skilled Therapeutic Interventions/Progress Updates:     Pt greeted seated in [facility] with NT taking vitals. Pt stating his back is a little sore from sitting in WC. Reported later after position changes and increased activity that he was feeling better.   Transported pt outside to [facility] entrance for gait training on uneven surfaces including bricks, side walks, inclines and declines, street surfaces, and small curbs.   279f, 258fwith seated rest breaks between, using RW and CGA. Pt demonstrating decreased gait speed and decreased step and stride length. Cues to improve upright posture and for better clearance of L LE during swing phase to decreased dragging. Pt had multiple instances of L hand slipping off RW handle and was able to replace hand and grasp handle without any assistance, but needed intermittent verbal cuing.  Pt demonstrating some L inattention evident by downward gazing to the right frequently when ambulating.   [facility]   [facility] session with CGA and cues needed for RW management as pt would frequently keep RW remaining in front of him when squaring up to sit down.   Sit>stand from WC to RW CGA, amb ~1069fo EOB CGA and RW, turned to face away from the bed and left RW to the side which cues were give for correction. Pt then side stepped 5 steps to the right with RW and CGA for steadiness and min assist for RW  management. Stand>sit from RW to EOB with CGA.  Sit >supine with SPV and pt using R UE on bed features minimally.  Pt inquiring whether he would be able to get back to driving soon and pt was told that his doctor would give him approval on when he can get back to driving.   Pt left supine in bed with wife present, call bell in reach, bed alarm on, all needs met   Therapy Documentation Precautions:  Precautions Precautions: Fall Precaution Comments: L hemi Restrictions Weight Bearing Restrictions: No   Therapy/Group: Individual Therapy  AnnKayleen MemosPT 07/16/2022, 8:02 AM

## 2022-07-17 LAB — GLUCOSE, CAPILLARY
Glucose-Capillary: 170 mg/dL — ABNORMAL HIGH (ref 70–99)
Glucose-Capillary: 157 mg/dL — ABNORMAL HIGH (ref 70–99)
Glucose-Capillary: 147 mg/dL — ABNORMAL HIGH (ref 70–99)
Glucose-Capillary: 138 mg/dL — ABNORMAL HIGH (ref 70–99)

## 2022-07-17 NOTE — Progress Notes (Signed)
Physical Therapy Session Note  Patient Details  Name: Thomas [name] MRN: [id number] Date of Birth: 1945-12-06  Today's Date: 07/17/2022 PT Individual Time: 0915-1025 PT Individual Time Calculation (min): 70 min   Short Term Goals: Week 1:  PT Short Term Goal 1 (Week 1): Patient will perform bed/chair transfer with LRAD and MinA PT Short Term Goal 2 (Week 1): Patient will ambulate 70' with LRAD and MinA PT Short Term Goal 3 (Week 1): Patient will initiate curb training for safety entry into home  Skilled Therapeutic Interventions/Progress Updates: Pt presented in w/c agreeable to therapy. Pt denies pain at rest. Pt states feels has been improving in use of LUE and states appreciative of primary therapist's work. Session focused on ambulation and L NMR. Pt transported to day room for energy conservation and participated in ambulation around nsg station ~149f with RW and CGA. Pt noted with increased fatigue decreased L foot clearance however maintained fair safety with activity and was able to safely negotiate around obstacles at nsg station with min cues. Pt then participated in several obstacle courses incorporating weaving through cones, stepping over thresholds, and stepping on compliant surface. Pt did require min cues to maintain close proximity to RW when weaving through cones but did not have any LOB with activity. Pt also participated in ambulating while tapping then stepping over cones for forced use of LLE and coordination. On last 2 cones pt did demonstrate decreased coordination as well as increased L attention requiring cues to find w/c which was located on L. Participated in x 1 round of horseshoes in standing for dynamic balance and forced use of LUE. Pt then indicated urgent need for urination. Pt transported back to room and performed ambulatory transfer to toilet with pt able to perform LB clothing management with minA and had continent void in standing with CGA. Pt did require minA to  pull pants over hips on L side. Pt then ambulated backwards out of bathroom due to equipment in room and returned to w/c. Pt requesting to return to bed to rest for next session. Performed stand pivot HHA to bed and was able to perform sit to supine with supervision and use of bed rails only. Pt left in bed at end of session with bed alarm on, call bell within reach and needs met.      Therapy Documentation Precautions:  Precautions Precautions: Fall Precaution Comments: L hemi Restrictions Weight Bearing Restrictions: No General:   Vital Signs: Therapy Vitals Temp: 98.6 F (37 C) Temp Source: Oral Pulse Rate: (!) 56 Resp: 17 BP: 120/64 Patient Position (if appropriate): Sitting Oxygen Therapy SpO2: 98 % O2 Device: Room Air Pain:   Mobility:   Locomotion :    Trunk/Postural Assessment :    Balance:   Exercises:   Other Treatments:      Therapy/Group: Individual Therapy  Kriss Perleberg 07/17/2022, 4:05 PM

## 2022-07-17 NOTE — Progress Notes (Signed)
Physical Therapy Session Note  Patient Details  Name: Thomas [name] MRN: [id number] Date of Birth: 05-25-1946  Today's Date: 07/17/2022 PT Individual Time: 1300-1345 PT Individual Time Calculation (min): 45 min   Short Term Goals: Week 1:  PT Short Term Goal 1 (Week 1): Patient will perform bed/chair transfer with LRAD and MinA PT Short Term Goal 2 (Week 1): Patient will ambulate 56' with LRAD and MinA PT Short Term Goal 3 (Week 1): Patient will initiate curb training for safety entry into home  Skilled Therapeutic Interventions/Progress Updates: Pt presents sitting in w/c and agreeable to therapy.  Min A for donning shoes.  Pt negotiated w/c from room and into hallway using B LE s and Supervision.  Pt somewhat irritable w/ activity stating will get tired before starting therapy, but still compliant.  Pt transfers sit to stand w/ CGA and verbal cues for increased use of LUE.  Pt amb multiple trials w/ RW and CGA to close supervision including turns to return to seat.  Verbal cues given for safe turns and for proper positioning in RW to maintain upright posture.  Pt irritable that "everybody tells me something" but agreeable to participate.  Coban wrap applied to L RW hand grip w/ improved grip w/o slipping.  Pt returned to room and remained sitting in w/c w/ chair alarm on.  MD in to see pt.  Spouse remains in room.     Therapy Documentation Precautions:  Precautions Precautions: Fall Precaution Comments: L hemi Restrictions Weight Bearing Restrictions: No General:   Vital Signs: Therapy Vitals Temp: 98.6 F (37 C) Temp Source: Oral Pulse Rate: (!) 56 Resp: 17 BP: 120/64 Patient Position (if appropriate): Sitting Oxygen Therapy SpO2: 98 % O2 Device: Room Air Pain: no c/o.      Therapy/Group: Individual Therapy  Thomas [name] 07/17/2022, 3:39 PM

## 2022-07-17 NOTE — Progress Notes (Signed)
Occupational Therapy Weekly Progress Note  Patient Details  Name: JAK HAGGAR MRN: [id number] Date of Birth: 12-06-1945  Beginning of progress report period: July 10, 2022 End of progress report period: July 17, 2022  Patient has met 1 of 3 short term goals.  Pt demonstrates increased independence in transfer completing transfers at mod I level. Pt is progressing toward increasing LUE strength to 4+/5 and completing dressing tasks with independence.   Patient continues to demonstrate the following deficits: muscle weakness, decreased cardiorespiratoy endurance, ataxia, decreased coordination, and decreased motor planning, and decreased standing balance and decreased balance strategies and therefore will continue to benefit from skilled OT intervention to enhance overall performance with BADL.  Patient progressing toward long term goals..  Continue plan of care.  OT Short Term Goals Week 1:  OT Short Term Goal 1 (Week 1): Pt will improve UB strength to 4+/5 MMT for safe and I task performance. OT Short Term Goal 1 - Progress (Week 1): Progressing toward goal OT Short Term Goal 2 (Week 1): Pt with complete UB/LB dressing with Ind OT Short Term Goal 2 - Progress (Week 1): Progressing toward goal OT Short Term Goal 3 (Week 1): Patient will complete all functional transfers with ModI OT Short Term Goal 3 - Progress (Week 1): Met OT Short Term Goal 5 (Week 1): STG=LTG Week 2:   STG=LTG  Skilled Therapeutic Interventions/Progress Updates:     Pt currently requires min A for LB bathing and dressing. Pt able to complete UB dressing and bathing with close supervision. Pt mod I in functional transfers with RW. Pt able to complete grooming tasks with set-up assist. Pt received good support from wife who he will be discharging home with.   Therapy Documentation Precautions:  Precautions Precautions: Fall Precaution Comments: L hemi Restrictions Weight Bearing Restrictions:  No ADL: ADL Eating: Set up Where Assessed-Eating:  (based on observation) Grooming: Setup Where Assessed-Grooming: Sitting at sink Upper Body Bathing: Setup Where Assessed-Upper Body Bathing: Edge of bed Lower Body Bathing: Moderate assistance Where Assessed-Lower Body Bathing: Bed level Upper Body Dressing: Minimal assistance, Minimal cueing Where Assessed-Upper Body Dressing: Edge of bed Lower Body Dressing: Moderate assistance, Moderate cueing Where Assessed-Lower Body Dressing: Edge of bed (using the RW coming from sit to stand with the assistance of 2) Toileting: Moderate assistance (with the assistance of 2 people using a RW for additional balance.) Where Assessed-Toileting:  (based on observation of transferring from EOB to w/c) Toilet Transfer: Minimal assistance Toilet Transfer Method: Ambulating Toilet Transfer Equipment: Other (comment) Tub/Shower Transfer: Not assessed  Therapy/Group: Individual Therapy  Janey Genta 07/17/2022, 5:05 PM

## 2022-07-17 NOTE — Progress Notes (Signed)
PROGRESS NOTE   Subjective/Complaints:  No new concerns this AM. His wife is visiting. He is looking forward to going home soon.   ROS: Patient denies CP, SOB, N/V/D, headache, abdominal pain, cough, vision changes Positive for insomnia    Objective:   No results found. No results for input(s): "WBC", "HGB", "HCT", "PLT" in the last 72 hours.   No results for input(s): "NA", "K", "CL", "CO2", "GLUCOSE", "BUN", "CREATININE", "CALCIUM" in the last 72 hours.   Intake/Output Summary (Last 24 hours) at 07/17/2022 1413 Last data filed at 07/17/2022 1251 Gross per 24 hour  Intake 880 ml  Output 800 ml  Net 80 ml         Physical Exam: Vital Signs Blood pressure 120/64, pulse (!) 56, temperature 98.6 F (37 C), temperature source Oral, resp. rate 17, height _0  (1.803 m), weight 93.7 kg, SpO2 98 %.   General: No acute distress, seen at bedside Mood and affect are appropriate, pleasant, jovial  Heart: Regular rate and rhythm no rubs murmurs or extra sounds, no JVD Lungs: Clear to auscultation, breathing unlabored, no rales or wheezes, good air movement Abdomen: Positive bowel sounds, soft nontender to palpation, nondistended Extremities: No clubbing, cyanosis, or edema Skin: Warm dry no breakdown noted   Neuro:  fair insight and awareness. Normal language, sl dysarthria. Functional memory. RUE 5/5. LUE 3+/5 prox to distal. RLE 4+/5 prox to distal. LLE 2/5 prox to 4/5 distally. No LTsensory deficits but has reduced proprioception in the left hand . Decreased FMC of left hand and leg. No abnl tone. Mild dysmetria LUE  Musculoskeletal: Full ROM L knee, no effusion or erythema , pain with L hip int rotation, no pain with ext rot   Assessment/Plan: 1. Functional deficits which require 3+ hours per day of interdisciplinary therapy in a comprehensive inpatient rehab setting. Physiatrist is providing close team  supervision and 24 hour management of active medical problems listed below. Physiatrist and rehab team continue to assess barriers to discharge/monitor patient progress toward functional and medical goals  Care Tool:  Bathing    Body parts bathed by patient: Right arm, Left arm, Chest, Abdomen, Front perineal area, Right upper leg, Left upper leg, Face   Body parts bathed by helper: Right lower leg, Left lower leg, Buttocks     Bathing assist Assist Level: Minimal Assistance - Patient > 75%     Upper Body Dressing/Undressing Upper body dressing   What is the patient wearing?: Pull over shirt    Upper body assist Assist Level: Minimal Assistance - Patient > 75%    Lower Body Dressing/Undressing Lower body dressing      What is the patient wearing?: Underwear/pull up, Pants     Lower body assist Assist for lower body dressing: Moderate Assistance - Patient 50 - 74%     Toileting Toileting    Toileting assist Assist for toileting: Independent with assistive device Assistive Device Comment: urinal   Transfers Chair/bed transfer  Transfers assist     Chair/bed transfer assist level: Minimal Assistance - Patient > 75% Chair/bed transfer assistive device: Armrests, Programmer, multimedia   Ambulation assist  Assist level: Contact Guard/Touching assist Assistive device: Walker-rolling Max distance: 134f   Walk 10 feet activity   Assist     Assist level: Moderate Assistance - Patient - 50 - 74% Assistive device: Walker-hemi   Walk 50 feet activity   Assist Walk 50 feet with 2 turns activity did not occur: Safety/medical concerns (Patient unable to ambulate >15' and assess stair, curb, ramp mobility at this time secondary to reports of increased B knee pain, impaired endurance, decreased global strenght)         Walk 150 feet activity   Assist Walk 150 feet activity did not occur: Safety/medical concerns         Walk 10 feet on  uneven surface  activity   Assist Walk 10 feet on uneven surfaces activity did not occur: Safety/medical concerns         Wheelchair     Assist Is the patient using a wheelchair?: Yes Type of Wheelchair: Manual    Wheelchair assist level: Maximal Assistance - Patient 25 - 49% Max wheelchair distance: 150'    Wheelchair 50 feet with 2 turns activity    Assist        Assist Level: Moderate Assistance - Patient 50 - 74%   Wheelchair 150 feet activity     Assist      Assist Level: Maximal Assistance - Patient 25 - 49%   Blood pressure 120/64, pulse (!) 56, temperature 98.6 F (37 C), temperature source Oral, resp. rate 17, height _0  (1.803 m), weight 93.7 kg, SpO2 98 %.  Medical Problem List and Plan: 1. 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              -30 day cardiac event monitoring recommended as outpt by neuro             -patient may shower             -ELOS/Goals: 07/22/2022, supervision/mod I  -Continue CIR therapies including PT, OT and SLP  -Discussed dc date planned for 07/22/22 with patient 2.  Antithrombotics: -DVT/anticoagulation:  Pharmaceutical: Lovenox start             -antiplatelet therapy: Plavix and aspirin daily for minimum 6 months 3. Pain Management: Tylenol as needed.  Has oxycodone as needed but not using             -See #13 below  Hip pain no evidence of sig OA or fx on xray  4. Mood/Behavior/Sleep: LCSW to evaluate and team to provide emotional support             -antipsychotic agents: n/a             -Continue Cymbalta 60 mg every evening  -await neuropsych    5. Neuropsych/cognition: This patient is capable of making decisions on his own behalf. 6. Skin/Wound Care: Routine skin care checks 7. Fluids/Electrolytes/Nutrition: Strict I's and O's and follow-up chemistries             -2 g sodium diet 8: CAD status  post PCI 10/31 overlapping DES to LAD             -Plavix and aspirin for at least 6 months             -Continue lipid lowering therapy             -Follow-up with Dr. PVirgina Jock -Denies chest pain continue  to monitor 9: PAD: Severe claudication status post bilateral lower extremity arteriogram             -Follow-up as outpatient with Dr. Virgina Jock 10: Heart failure with reduced ejection fraction:              -Weigh patient daily             -Continue Imdur 30 mg daily             -Continue Norvasc 10 mg daily             -Continue Toprol-XL 25 mg daily  -11/9 daily weights appear to be stable, continue to monitor Filed Weights   07/15/22 0500 07/16/22 0500 07/17/22 0606  Weight: 92.4 kg 90.3 kg 93.7 kg    11: OAB: continue Myrbetriq, Flomax 12: Hyperlipidemia: Continue Lipitor 80 mg nightly             -Continue Zetia 10 mg daily 13: Low back pain/radiculopathy/neuropathy:              -Methadone 5 mg every other day, Was on this as OP, ? Prescriber              -Zanaflex 4 mg at nighttime -Following with Dr. Laurance Flatten -will schedule oxycodone 45m 3x per day as he says this was how he was using at home.  14: Tobacco use: Cessation counseling 15: Diabetes mellitus: CBGs before every meal, at bedtime.               -Continue Jardiance 10 mg before breakfast             -Continue sliding scale with meals  CBG (last 3)  Recent Labs    07/16/22 2134 07/17/22 0603 07/17/22 1149  GLUCAP 167* 170* 157*   Increase Jardiance to 263m -11/10 fairly well controlled, continue current medications 16: Gout: Continue colchicine 0.6 mg twice daily 17: Insomnia  -11/9 has been taking 50 mg as needed trazodone, I will schedule 75 mg dose  -improved continue trazodone 7575mLOS: 7 days A FACE TO FACE EVALUATION WAS PERFORMED  YurJennye Boroughs/06/2022, 2:13 PM

## 2022-07-17 NOTE — Progress Notes (Signed)
Occupational Therapy Session Note  Patient Details  Name: Thomas [name] MRN: [id number] Date of Birth: [birth year]  Today's Date: 07/17/2022 OT Individual Time: 1045-1200 OT Individual Time Calculation (min): 75 min    Short Term Goals: Week 1:  OT Short Term Goal 1 (Week 1): Pt will improve UB strength to 4+/5 MMT for safe and I task performance. OT Short Term Goal 2 (Week 1): Pt with complete UB/LB dressing with Ind OT Short Term Goal 3 (Week 1): Patient will complete all functional transfers with ModI  Skilled Therapeutic Interventions/Progress Updates:     Pt received resting in bed in good spirits receptive to skilled OT session. Pt reporting 0/10 pain during session. Pt supine>EOB>stand pivot to wc supervision. Pt dressed with all BADL needs met upon OT arrival. Pt transported to therapy gym for OT session with a focus on LUE NMR-education and functional use of LUE.  Therapeutic Exercise: Pt educated on therputty exercises completing 1x10 reps of flattening, spreading, squeezing, and pinching using yellow (light resistant) therputty. Pt provided handout demonstrating good teach back receptive of education.  NMR-education: Pt comepleted piping activity replicating pipe formation from picture. Pt provided moderate verbal cues to extend fingers when reaching and picking up pipe pieces and to grasp pieces. Pt required min support at shoulder and elbow to reach eye level to put together pieces. Pt gross grasp and pinch 4/5. Pt pulled apart pipe pieces with moderate assistance.  Therapeutic Activity: Pt completed cork board and pin activity seated at table to facilitate pinch and release and functional reaching. Pt requiring increased time to pinch small pin dropping 50~ of the time. Pt able to lift arm to eye level with min support provided at proximal arm when giving maximal effort.  Pt completed beading task with large sided beads and string. Beads presented at Pt L side to increase attention  to side and facilitate reaching outside BOS with LUE. Pt able to bead 8/8 bead with increased time pinching string with L pointer finger and thumb to pull string through bead.  Pt was transported back to his room in wc total A and left resting in his wc with call bell in reach, bed alarm on, and all needs met.   Therapy Documentation Precautions:  Precautions Precautions: Fall Precaution Comments: L hemi Restrictions Weight Bearing Restrictions: No  Pain: Pain Assessment Pain Scale: 0-10 Pain Score: 5  Pain Type: Chronic pain Pain Location: Knee Pain Orientation: Left Pain Intervention(s): Medication (See eMAR) ADL: ADL Eating: Set up Where Assessed-Eating:  (based on observation) Grooming: Setup Where Assessed-Grooming: Sitting at sink Upper Body Bathing: Setup Where Assessed-Upper Body Bathing: Edge of bed Lower Body Bathing: Moderate assistance Where Assessed-Lower Body Bathing: Bed level Upper Body Dressing: Minimal assistance, Minimal cueing Where Assessed-Upper Body Dressing: Edge of bed Lower Body Dressing: Moderate assistance, Moderate cueing Where Assessed-Lower Body Dressing: Edge of bed (using the RW coming from sit to stand with the assistance of 2) Toileting: Moderate assistance (with the assistance of 2 people using a RW for additional balance.) Where Assessed-Toileting:  (based on observation of transferring from EOB to w/c) Toilet Transfer: Minimal assistance Toilet Transfer Method: Ambulating Toilet Transfer Equipment: Other (comment) Tub/Shower Transfer: Not assessed   Therapy/Group: Individual Therapy  Janey Genta 07/17/2022, 11:39 AM

## 2022-07-18 LAB — GLUCOSE, CAPILLARY
Glucose-Capillary: 132 mg/dL — ABNORMAL HIGH (ref 70–99)
Glucose-Capillary: 139 mg/dL — ABNORMAL HIGH (ref 70–99)
Glucose-Capillary: 164 mg/dL — ABNORMAL HIGH (ref 70–99)
Glucose-Capillary: 126 mg/dL — ABNORMAL HIGH (ref 70–99)

## 2022-07-18 NOTE — Progress Notes (Signed)
Speech Language Pathology Daily Session Note  Patient Details  Name: Thomas [name] MRN: [id number] Date of Birth: 08-May-1946  Today's Date: 07/18/2022 SLP Individual Time: 1500-1530 SLP Individual Time Calculation (min): 30 min  Short Term Goals: Week 1: SLP Short Term Goal 1 (Week 1): Pt will utilize memory compensatory aids to recall daily information with mod assist. SLP Short Term Goal 2 (Week 1): Pt will complete mildly complex familiar tasks with mod assist for functional problem solving. SLP Short Term Goal 3 (Week 1): Pt will recognize and correct errors during functional tasks wtih mod assist. SLP Short Term Goal 4 (Week 1): Pt will sustain his attention to mildly complex tasks for 10 minute intervals with min assist for redirection.  Skilled Therapeutic Interventions: Skilled ST treatment focused on cognitive goals. SLP facilitated a sustained attention, working memory, and sequencing task with the BITS by recalled a list of words in sequential order with overall min-to-mod A verbal cues for recall and attention to achieve 75% accuracy across a 15 minute duration within a mildly distracting environment. Pt required max A verbal/visual cues to recognize errors.  Patient was left in bed with alarm activated and immediate needs within reach at end of session. Continue per current plan of care.      Pain  None/denied  Therapy/Group: Individual Therapy  Patty Sermons 07/18/2022, 4:18 PM

## 2022-07-18 NOTE — Progress Notes (Signed)
PROGRESS NOTE   Subjective/Complaints:  No new issues, asked about travel, we discussed need for additional PT, OT post d/c, would like to go to resolve but they have only PT and focus on Ortho   ROS: Patient denies CP, SOB, N/V/D, headache, abdominal pain, cough, vision changes Positive for insomnia    Objective:   No results found. No results for input(s): "WBC", "HGB", "HCT", "PLT" in the last 72 hours.   No results for input(s): "NA", "K", "CL", "CO2", "GLUCOSE", "BUN", "CREATININE", "CALCIUM" in the last 72 hours.   Intake/Output Summary (Last 24 hours) at 07/18/2022 1431 Last data filed at 07/18/2022 1300 Gross per 24 hour  Intake 694 ml  Output 1575 ml  Net -881 ml         Physical Exam: Vital Signs Blood pressure (!) 159/67, pulse 60, temperature 98.2 F (36.8 C), temperature source Oral, resp. rate 18, height _0  (1.803 m), weight 93.7 kg, SpO2 97 %.   General: No acute distress, seen at bedside Mood and affect are appropriate, pleasant, jovial  Heart: Regular rate and rhythm no rubs murmurs or extra sounds, no JVD Lungs: Clear to auscultation, breathing unlabored, no rales or wheezes, good air movement Abdomen: Positive bowel sounds, soft nontender to palpation, nondistended Extremities: No clubbing, cyanosis, or edema Skin: Warm dry no breakdown noted   Neuro:  fair insight and awareness. Normal language, sl dysarthria. Functional memory. RUE 5/5. LUE 3+/5 prox to distal. RLE 4+/5 prox to distal. LLE 2/5 prox to 4/5 distally. No LTsensory deficits but has reduced proprioception in the left hand . Decreased FMC of left hand and leg. No abnl tone. Mild dysmetria LUE  Musculoskeletal: Full ROM L knee, no effusion or erythema , pain with L hip int rotation, no pain with ext rot   Assessment/Plan: 1. Functional deficits which require 3+ hours per day of interdisciplinary therapy in a comprehensive  inpatient rehab setting. Physiatrist is providing close team supervision and 24 hour management of active medical problems listed below. Physiatrist and rehab team continue to assess barriers to discharge/monitor patient progress toward functional and medical goals  Care Tool:  Bathing    Body parts bathed by patient: Right arm, Left arm, Chest, Abdomen, Front perineal area, Right upper leg, Left upper leg, Face   Body parts bathed by helper: Right lower leg, Left lower leg, Buttocks     Bathing assist Assist Level: Minimal Assistance - Patient > 75%     Upper Body Dressing/Undressing Upper body dressing   What is the patient wearing?: Pull over shirt    Upper body assist Assist Level: Minimal Assistance - Patient > 75%    Lower Body Dressing/Undressing Lower body dressing      What is the patient wearing?: Underwear/pull up, Pants     Lower body assist Assist for lower body dressing: Moderate Assistance - Patient 50 - 74%     Toileting Toileting    Toileting assist Assist for toileting: Independent with assistive device Assistive Device Comment: urinal   Transfers Chair/bed transfer  Transfers assist     Chair/bed transfer assist level: Minimal Assistance - Patient > 75% Chair/bed transfer assistive device: Armrests,  Walker   Locomotion Ambulation   Ambulation assist      Assist level: Contact Guard/Touching assist Assistive device: Walker-rolling Max distance: 110   Walk 10 feet activity   Assist     Assist level: Contact Guard/Touching assist Assistive device: Walker-rolling   Walk 50 feet activity   Assist Walk 50 feet with 2 turns activity did not occur: Safety/medical concerns (Patient unable to ambulate >15' and assess stair, curb, ramp mobility at this time secondary to reports of increased B knee pain, impaired endurance, decreased global strenght)  Assist level: Contact Guard/Touching assist Assistive device: Walker-rolling     Walk 150 feet activity   Assist Walk 150 feet activity did not occur: Safety/medical concerns         Walk 10 feet on uneven surface  activity   Assist Walk 10 feet on uneven surfaces activity did not occur: Safety/medical concerns         Wheelchair     Assist Is the patient using a wheelchair?: Yes Type of Wheelchair: Manual    Wheelchair assist level: Supervision/Verbal cueing Max wheelchair distance: 150'    Wheelchair 50 feet with 2 turns activity    Assist        Assist Level: Supervision/Verbal cueing   Wheelchair 150 feet activity     Assist      Assist Level: Supervision/Verbal cueing   Blood pressure (!) 159/67, pulse 60, temperature 98.2 F (36.8 C), temperature source Oral, resp. rate 18, height _0  (1.803 m), weight 93.7 kg, SpO2 97 %.  Medical Problem List and Plan: 1. 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              -30 day cardiac event monitoring recommended as outpt by neuro             -patient may shower             -ELOS/Goals: 07/22/2022, supervision/mod I  -Continue CIR therapies including PT, OT and SLP  -Discussed dc date planned for 07/22/22 with patient 2.  Antithrombotics: -DVT/anticoagulation:  Pharmaceutical: Lovenox start             -antiplatelet therapy: Plavix and aspirin daily for minimum 6 months 3. Pain Management: Tylenol as needed.  Has oxycodone as needed but not using             -See #13 below  Hip pain no evidence of sig OA or fx on xray  4. Mood/Behavior/Sleep: LCSW to evaluate and team to provide emotional support             -antipsychotic agents: n/a             -Continue Cymbalta 60 mg every evening  -await neuropsych    5. Neuropsych/cognition: This patient is capable of making decisions on his own behalf. 6. Skin/Wound Care: Routine skin care checks 7.  Fluids/Electrolytes/Nutrition: Strict I's and O's and follow-up chemistries             -2 g sodium diet 8: CAD status post PCI 10/31 overlapping DES to LAD             -Plavix and aspirin for at least 6 months             -Continue lipid lowering therapy             -Follow-up with Dr. Virgina Jock  -Denies chest pain continue to monitor  9: PAD: Severe claudication status post bilateral lower extremity arteriogram             -Follow-up as outpatient with Dr. Virgina Jock 10: Heart failure with reduced ejection fraction:              -Weigh patient daily             -Continue Imdur 30 mg daily             -Continue Norvasc 10 mg daily             -Continue Toprol-XL 25 mg daily  -11/9 daily weights appear to be stable, continue to monitor Filed Weights   07/15/22 0500 07/16/22 0500 07/17/22 0606  Weight: 92.4 kg 90.3 kg 93.7 kg    11: OAB: continue Myrbetriq, Flomax 12: Hyperlipidemia: Continue Lipitor 80 mg nightly             -Continue Zetia 10 mg daily 13: Low back pain/radiculopathy/neuropathy:              -Methadone 5 mg every other day, Was on this as OP, ? Prescriber              -Zanaflex 4 mg at nighttime -Following with Dr. Laurance Flatten -will schedule oxycodone 25m 3x per day as he says this was how he was using at home.  14: Tobacco use: Cessation counseling 15: Diabetes mellitus: CBGs before every meal, at bedtime.               -Continue Jardiance 10 mg before breakfast             -Continue sliding scale with meals  CBG (last 3)  Recent Labs    07/17/22 2111 07/18/22 0558 07/18/22 1116  GLUCAP 138* 132* 139*   Increase Jardiance to 270m -11/11  well controlled, continue current medications 16: Gout: Continue colchicine 0.6 mg twice daily 17: Insomnia  -11/9 has been taking 50 mg as needed trazodone, I will schedule 75 mg dose  -improved continue trazodone 7553mLOS: 8 days A FACE TO FACE EVALUATION WAS PERFORMED  AndCharlett Blake/07/2022, 2:31 PM

## 2022-07-18 NOTE — Progress Notes (Signed)
Occupational Therapy Session Note  Patient Details  Name: Thomas [name] MRN: [id number] Date of Birth: 08/27/1946  Today's Date: 07/18/2022 OT Individual Time: 1303-1405 OT Individual Time Calculation (min): 62 min    Short Term Goals: Week 1:  OT Short Term Goal 1 (Week 1): Pt will improve UB strength to 4+/5 MMT for safe and I task performance. OT Short Term Goal 1 - Progress (Week 1): Progressing toward goal OT Short Term Goal 2 (Week 1): Pt with complete UB/LB dressing with Ind OT Short Term Goal 2 - Progress (Week 1): Progressing toward goal OT Short Term Goal 3 (Week 1): Patient will complete all functional transfers with ModI OT Short Term Goal 3 - Progress (Week 1): Met OT Short Term Goal 5 (Week 1): STG=LTG  Skilled Therapeutic Interventions/Progress Updates:  pt greeted supine in bed, pt agreeable to OT intervention.  Supine>sit CGA CGA for stand pivot to w/c. Total A transport to gum where pt Was instructed to  reach to mirror from sitting in w/c to retrieve cards velcroed to mirror with an emphasis on functional/targeted reach and [facility]. Pt needed + time and effort to complete task but able to complete task with overall supervision.   NMR with pt instructed to laterally lean onto LUE from EOM to reach across midline to retrieve cones and then place cones on opposite side of mat table with an emphasis on WB'ing through LUE.   Graded task up and had pt stand to stack cones with LUE with an emphasis on LUE functional reaching and [facility], pt completed task with supervision with MIN cues for cylindrical grasp.   Pt completed seated peg board task with pt instructed to remove tasks from board with LUE with an emphasis on [facility]. Graded task up with pt removing pegs from board with 1.5 lb weight donned to LUE to provide increased proprioception to LUE.   Pt completed ambulatory toilet transfer from w/c>bathroom with CGA with Rw, pt completed 3/3 toileting tasks with CGA, pt with + urine  void.   Ended session with pt supine in bed with all needs within reach and wife present.      Therapy Documentation Precautions:  Precautions Precautions: Fall Precaution Comments: L hemi Restrictions Weight Bearing Restrictions: No  Pain: Urated pain reported in L elbow during lateral leans, provided foam pad under elbow to decrease pain.    Therapy/Group: Individual Therapy  Precious Haws 07/18/2022, 3:44 PM

## 2022-07-19 LAB — GLUCOSE, CAPILLARY
Glucose-Capillary: 104 mg/dL — ABNORMAL HIGH (ref 70–99)
Glucose-Capillary: 145 mg/dL — ABNORMAL HIGH (ref 70–99)
Glucose-Capillary: 145 mg/dL — ABNORMAL HIGH (ref 70–99)
Glucose-Capillary: 105 mg/dL — ABNORMAL HIGH (ref 70–99)

## 2022-07-19 NOTE — Progress Notes (Signed)
Physical Therapy Weekly Progress Note  Patient Details  Name: Thomas [name] MRN: [id number] Date of Birth: Aug 17, 1946  Beginning of progress report period: July 11, 2022 End of progress report period: July 19, 2022  Today's Date: 07/19/2022 PT Individual Time: 0800-0856 PT Individual Time Calculation (min): 56 min   Patient has met 3 of 3 short term goals.  Pt has progressed to being Supervision to [place] for transfers with RW and is able to ambulate up to 150 ft with RW with CGA to min A. Pt has also progressed to being able to navigate thresholds in order to safely enter and exit his home with use of RW.  Patient continues to demonstrate the following deficits muscle weakness, decreased cardiorespiratoy endurance, decreased coordination, decreased safety awareness, and decreased standing balance, decreased postural control, and decreased balance strategies and therefore will continue to benefit from skilled PT intervention to increase functional independence with mobility.  Patient progressing toward long term goals..  Continue plan of care.  PT Short Term Goals Week 1:  PT Short Term Goal 1 (Week 1): Patient will perform bed/chair transfer with LRAD and MinA PT Short Term Goal 1 - Progress (Week 1): Met PT Short Term Goal 2 (Week 1): Patient will ambulate 93' with LRAD and MinA PT Short Term Goal 2 - Progress (Week 1): Met PT Short Term Goal 3 (Week 1): Patient will initiate curb training for safety entry into home PT Short Term Goal 3 - Progress (Week 1): Met Week 2:  PT Short Term Goal 1 (Week 2): =LTG due to ELOS  Skilled Therapeutic Interventions/Progress Updates:  Ambulation/gait training;Community reintegration;DME/adaptive equipment instruction;Neuromuscular re-education;Psychosocial support;Stair training;UE/LE Strength taining/ROM;Wheelchair propulsion/positioning;Balance/vestibular training;Discharge planning;Functional electrical stimulation;Pain management;Skin  care/wound management;Therapeutic Activities;UE/LE Coordination activities;Cognitive remediation/compensation;Disease management/prevention;Functional mobility training;Patient/family education;Splinting/orthotics;Therapeutic Exercise;Visual/perceptual remediation/compensation   Pt received seated EOB with nursing present. Pt requesting to toilet and to shower this AM. As pt has no OT scheduled this date this therapist agreeable to assist pt with showering. No complaints of pain. Sit to stand with CGA to RW. Ambulatory transfer into bathroom with RW and CGA. Toilet transfer with Supervision. Pt is Supervision for standing balance for 3/3 toileting steps. Ambulatory transfer to shower chair with RW and Supervision. Pt is setup A to shower while seated on shower chair, Supervision for standing balance while washing periarea. Pt needs some assist with LB dressing and is setup A for UB dressing. Pt is setup A for oral hygiene while seated in w/c at sink. Ambulation 2 x 150 ft with RW and Supervision, one instance of L knee "giving out" per pt report as pt leaves limb behind when taking a large step  with RLE. Standing alt L/R 4" step-taps with RW for support and CGA for balance. Pt requests to return to bed at end of session, mod I for bed mobility. Pt left supine in bed with needs in reach, bed alarm in place. Pt very appreciative of ability to shower this date.  Therapy Documentation Precautions:  Precautions Precautions: Fall Precaution Comments: L hemi Restrictions Weight Bearing Restrictions: No     Therapy/Group: Individual Therapy   Excell Seltzer, PT, DPT, CSRS 07/19/2022, 9:37 AM

## 2022-07-20 ENCOUNTER — Inpatient Hospital Stay (HOSPITAL_COMMUNITY): Payer: Medicare Other

## 2022-07-20 DIAGNOSIS — N183 Chronic kidney disease, stage 3 unspecified: Secondary | ICD-10-CM

## 2022-07-20 DIAGNOSIS — M1712 Unilateral primary osteoarthritis, left knee: Secondary | ICD-10-CM

## 2022-07-20 LAB — BASIC METABOLIC PANEL
Anion gap: 9 (ref 5–15)
BUN: 17 mg/dL (ref 8–23)
CO2: 23 mmol/L (ref 22–32)
Calcium: 8.8 mg/dL — ABNORMAL LOW (ref 8.9–10.3)
Chloride: 105 mmol/L (ref 98–111)
Creatinine, Ser: 1.39 mg/dL — ABNORMAL HIGH (ref 0.61–1.24)
GFR, Estimated: 52 mL/min — ABNORMAL LOW (ref >60)
Glucose, Bld: 126 mg/dL — ABNORMAL HIGH (ref 70–99)
Potassium: 3.8 mmol/L (ref 3.5–5.1)
Sodium: 137 mmol/L (ref 135–145)

## 2022-07-20 LAB — GLUCOSE, CAPILLARY
Glucose-Capillary: 121 mg/dL — ABNORMAL HIGH (ref 70–99)
Glucose-Capillary: 143 mg/dL — ABNORMAL HIGH (ref 70–99)
Glucose-Capillary: 167 mg/dL — ABNORMAL HIGH (ref 70–99)
Glucose-Capillary: 137 mg/dL — ABNORMAL HIGH (ref 70–99)

## 2022-07-20 LAB — CBC
HCT: 34.3 % — ABNORMAL LOW (ref 39.0–52.0)
Hemoglobin: 12.5 g/dL — ABNORMAL LOW (ref 13.0–17.0)
MCH: 32.6 pg (ref 26.0–34.0)
MCHC: 36.4 g/dL — ABNORMAL HIGH (ref 30.0–36.0)
MCV: 89.3 fL (ref 80.0–100.0)
Platelets: 193 10*3/uL (ref 150–400)
RBC: 3.84 MIL/uL — ABNORMAL LOW (ref 4.22–5.81)
RDW: 12.4 % (ref 11.5–15.5)
WBC: 5.8 10*3/uL (ref 4.0–10.5)
nRBC: 0 % (ref 0.0–0.2)

## 2022-07-20 NOTE — Progress Notes (Signed)
Speech Language Pathology Daily Session Note  Patient Details  Name: Thomas [name] MRN: [id number] Date of Birth: May 28, 1946  Today's Date: 07/20/2022 SLP Individual Time: 7943-2761 SLP Individual Time Calculation (min): 42 min  Short Term Goals: Week 1: SLP Short Term Goal 1 (Week 1): Pt will utilize memory compensatory aids to recall daily information with mod assist. SLP Short Term Goal 2 (Week 1): Pt will complete mildly complex familiar tasks with mod assist for functional problem solving. SLP Short Term Goal 3 (Week 1): Pt will recognize and correct errors during functional tasks wtih mod assist. SLP Short Term Goal 4 (Week 1): Pt will sustain his attention to mildly complex tasks for 10 minute intervals with min assist for redirection.  Skilled Therapeutic Interventions: Skilled ST treatment focused on cognitive goals. Pt was greeted in bed on arrival and accompanied by spouse. SLP and pt completed a medication management task to increase awareness of current medication regime. Pt was generally unfamiliar with most medications and stated "my wife knows about all that" and exhibited disinterest in focusing time on reviewing current medications. Per pt/spouse, pt would organize TID pillbox at [facility]. Pt organized TID pillbox with supervision A verbal cues for problem solving and reasoning. Pt exhibited decreased judgement during one occasion in regards to organizing pain medication QID with decreased understanding despite various explanations. Pt was discouraged by limited use of left hand and half way through wished to only speak about the task rather than physically complete. SLP discussed the ongoing need for supervision from spouse for medication management. Both pt/spouse were in agreement. Patient was left in wheelchair with alarm activated and immediate needs within reach at end of session. Continue per current plan of care.      Pain  None/denied  Therapy/Group: Individual  Therapy  Dotsie Gillette T Kama Cammarano 07/20/2022, 1:48 PM

## 2022-07-20 NOTE — Progress Notes (Signed)
Physical Therapy Session Note  Patient Details  Name: Thomas [name] MRN: [id number] Date of Birth: Jan 17, 1946  Today's Date: 07/20/2022 PT Individual Time: 1002-1050 PT Individual Time Calculation (min): 48 min   Short Term Goals: Week 2:  PT Short Term Goal 1 (Week 2): =LTG due to ELOS  Skilled Therapeutic Interventions/Progress Updates: Pt presents sitting in w/c and agreeable to therapy.  Pt transfers sit to stand w/ close supervision.  Pt amb w/ RW and close supervision x 150'.  Pt performed standing beanbag toss w/o UE support, using BUE s and crossing midline.  Pt also performs standing on [employer] cushion.  W/ unsteadiness noted and occasional reach for external support.  Pt returns to room w/ supervision to EOB.  Pt transfers bed > w/c w/ supervision and RW, occasional cues for safety in confined spaces.  Pt remains sitting in w/c w/ chair alarm on and all needs in reach.     Therapy Documentation Precautions:  Precautions Precautions: Fall Precaution Comments: L hemi Restrictions Weight Bearing Restrictions: No General:   Vital Signs:  Pain:8/10 L knee Pain Assessment Pain Scale: 0-10 Pain Score: 0-No pain Mobility:      Therapy/Group: Individual Therapy  Ladoris Gene 07/20/2022, 10:55 AM

## 2022-07-20 NOTE — Progress Notes (Signed)
Occupational Therapy Session Note  Patient Details  Name: Thomas [name] MRN: [id number] Date of Birth: [birth year]  Today's Date: 07/20/2022 OT Individual Time: 0900-1000 OT Individual Time Calculation (min): 60 min  OT Individual Time: 1400-1500 OT Individual Time Calculation (min): 60 min    Short Term Goals: Week 2:   STG=LTG  Skilled Therapeutic Interventions/Progress Updates:     Session 1: Pt received in room resting in wc with MD present. Pt in good spirits and receptive to skilled OT session. Pt requesting to shower this AM.  Pt ambulated to bathroom with RW close supervision. Pt completed 3/3 toileting tasks with close supervision. Pt ambulated to walk-in shower completing U/LB bathing seated on tub bench with close supervision. Pt required min verbal cues to utilize LUE during bathing tasks. Pt demonstrating improved safety awareness utilizing grab bars to balance during standing to wash posterior peri-area. Pt SOB following bathing and provided rest break prior to ambulating to bed room. Pt ambulated to room with RW close supervision. Pt completed UB dressing utilizing modified technique with set-up assist. Pt min A to weave feet through pants. Pt completed grooming tasks seated in wc at sink supervision. Pt utilized BUE to manage tooth paste and deodorant with increased grasp strength noted in LUE. Pt was left resting in his wc with call bell in reach, seat belt alarm on, and all needs met.   Session 2: Pt received in room resting in wc with wife present. Pt in good spirits and motivated to participate in skilled OT session. Pt reporting urgent need to urinate. Pt ambulated to bathroom RW close supervision and completed 3/3 toileting tasks with CGA. Pt transported to therapy gym total A in wc.   Pt completed lego block replication activity standing at elevated table with an emphasis on functional reach and [facility]. Pt required increased time and effort to complete tasks with CGA.Pt  provided rest break following task d/t decreased activity tolerance.   Pt completed peg replication task standing at elevated table with Pt instructed to recreate picture patten retrieving pegs presented at eye level. 1.5# weight donned on Pt LUE to provide increased proprioception to LUE.   Pt completed seated squigz task sitting EOM. Pt instructed to reach across midline with RUE to retrieve squigz x6 placed on Pt L side and place them on vertical mirror with weightbearing facilitation through Pt LUE. Pt then instructed to complete same task using LUE. Pt able to reach above head to place squigz on mirror with increase time and effort. Pt utilized L/RUEs to remove squigz with supervision.   Pt transported back to room in wc and was left resting in bed with call bell in reach, bed alarm on, and all needs met.   Therapy Documentation Precautions:  Precautions Precautions: Fall Precaution Comments: L hemi Restrictions Weight Bearing Restrictions: No General:   Vital Signs: Therapy Vitals Temp: 98.2 F (36.8 C) Temp Source: Oral Pulse Rate: 72 Resp: 18 BP: (Abnormal) 177/83 Patient Position (if appropriate): Lying Oxygen Therapy SpO2: 99 % O2 Device: Room Air Pain: Pain Assessment Pain Scale: 0-10 Pain Score: 0-No pain  Therapy/Group: Individual Therapy  Janey Genta 07/20/2022, 9:44 AM

## 2022-07-20 NOTE — Progress Notes (Signed)
PROGRESS NOTE   Subjective/Complaints:  Reports chronic L knee pain with ambulation. No additional concerns.   ROS: Patient denies CP, SOB, N/V/D,Constipation, headache, abdominal pain, cough, vision changes,  Positive for insomnia    Objective:   No results found. Recent Labs    07/20/22 0508  WBC 5.8  HGB 12.5*  HCT 34.3*  PLT 193     Recent Labs    07/20/22 0508  NA 137  K 3.8  CL 105  CO2 23  GLUCOSE 126*  BUN 17  CREATININE 1.39*  CALCIUM 8.8*     Intake/Output Summary (Last 24 hours) at 07/20/2022 1646 Last data filed at 07/20/2022 1316 Gross per 24 hour  Intake 716 ml  Output 400 ml  Net 316 ml         Physical Exam: Vital Signs Blood pressure (!) 116/57, pulse 60, temperature 98.4 F (36.9 C), temperature source Oral, resp. rate 18, height 5' 11" (1.803 m), weight 92.9 kg, SpO2 97 %.   General: No acute distress, seen at bedside Mood and affect are appropriate, pleasant, jovial  Heart: Regular rate and rhythm no rubs murmurs or extra sounds, no JVD Lungs: Clear to auscultation, breathing unlabored, no rales or wheezes, good air movement Abdomen: Positive bowel sounds, soft nontender to palpation, nondistended Extremities: No clubbing, cyanosis, or edema Skin: Warm dry no breakdown noted   Neuro:  fair insight and awareness. Normal language, sl dysarthria. Functional memory. RUE 5/5. LUE 3+/5 prox to distal. RLE 4+/5 prox to distal. LLE 2/5 prox to 4/5 distally. No LTsensory deficits but has reduced proprioception in the left hand . Decreased FMC of left hand and leg. No abnl tone. Mild dysmetria LUE  Musculoskeletal: Full ROM L knee, no effusion or erythema, left knee joint line tenderness,  pain with L hip int rotation, no pain with ext rot   Assessment/Plan: 1. Functional deficits which require 3+ hours per day of interdisciplinary therapy in a comprehensive inpatient rehab  setting. Physiatrist is providing close team supervision and 24 hour management of active medical problems listed below. Physiatrist and rehab team continue to assess barriers to discharge/monitor patient progress toward functional and medical goals  Care Tool:  Bathing    Body parts bathed by patient: Right arm, Left arm, Chest, Abdomen, Front perineal area, Right upper leg, Left upper leg, Face   Body parts bathed by helper: Right lower leg, Left lower leg, Buttocks     Bathing assist Assist Level: Minimal Assistance - Patient > 75%     Upper Body Dressing/Undressing Upper body dressing   What is the patient wearing?: Pull over shirt    Upper body assist Assist Level: Minimal Assistance - Patient > 75%    Lower Body Dressing/Undressing Lower body dressing      What is the patient wearing?: Underwear/pull up, Pants     Lower body assist Assist for lower body dressing: Moderate Assistance - Patient 50 - 74%     Toileting Toileting    Toileting assist Assist for toileting: Contact Guard/Touching assist Assistive Device Comment: urinal   Transfers Chair/bed transfer  Transfers assist     Chair/bed transfer assist level: Supervision/Verbal cueing  Chair/bed transfer assistive device: Museum/gallery exhibitions officer assist      Assist level: Contact Guard/Touching assist Assistive device: Walker-rolling Max distance: 125   Walk 10 feet activity   Assist     Assist level: Contact Guard/Touching assist Assistive device: Walker-rolling   Walk 50 feet activity   Assist Walk 50 feet with 2 turns activity did not occur: Safety/medical concerns (Patient unable to ambulate >15' and assess stair, curb, ramp mobility at this time secondary to reports of increased B knee pain, impaired endurance, decreased global strenght)  Assist level: Contact Guard/Touching assist Assistive device: Walker-rolling    Walk 150 feet activity   Assist Walk  150 feet activity did not occur: Safety/medical concerns         Walk 10 feet on uneven surface  activity   Assist Walk 10 feet on uneven surfaces activity did not occur: Safety/medical concerns         Wheelchair     Assist Is the patient using a wheelchair?: Yes Type of Wheelchair: Manual    Wheelchair assist level: Supervision/Verbal cueing Max wheelchair distance: 150'    Wheelchair 50 feet with 2 turns activity    Assist        Assist Level: Supervision/Verbal cueing   Wheelchair 150 feet activity     Assist      Assist Level: Supervision/Verbal cueing   Blood pressure (!) 116/57, pulse 60, temperature 98.4 F (36.9 C), temperature source Oral, resp. rate 18, height 5' 11" (1.803 m), weight 92.9 kg, SpO2 97 %.  Medical Problem List and Plan: 1. 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              -30 day cardiac event monitoring recommended as outpt by neuro             -patient may shower             -ELOS/Goals: 07/22/2022, supervision/mod I  -Continue CIR therapies including PT, OT and SLP  -Discussed dc date planned for 07/22/22 with patient 2.  Antithrombotics: -DVT/anticoagulation:  Pharmaceutical: Lovenox start             -antiplatelet therapy: Plavix and aspirin daily for minimum 6 months 3. Pain Management: Tylenol as needed.  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 Mood/Behavior/Sleep: LCSW to evaluate and team to provide emotional support             -antipsychotic agents: n/a             -Continue Cymbalta 60 mg every evening  -await neuropsych    5. Neuropsych/cognition: This patient is capable of making decisions on his own behalf. 6. Skin/Wound Care: Routine skin care  checks 7. Fluids/Electrolytes/Nutrition: Strict I's and O's and follow-up chemistries             -2 g sodium diet 8: CAD status post PCI 10/31 overlapping DES to LAD             -Plavix and aspirin for at least 6 months             -Continue lipid lowering therapy             -  Follow-up with Dr. Virgina Jock  -Denies chest pain continue to monitor 9: PAD: Severe claudication status post bilateral lower extremity arteriogram             -Follow-up as outpatient with Dr. Virgina Jock 10: Heart failure with reduced ejection fraction:              -Weigh patient daily             -Continue Imdur 30 mg daily             -Continue Norvasc 10 mg daily             -Continue Toprol-XL 25 mg daily  -11/13, weights appears stable, continue to monitor Filed Weights   07/17/22 0606 07/19/22 0500 07/20/22 0545  Weight: 93.7 kg 92.4 kg 92.9 kg    11: OAB: continue Myrbetriq, Flomax 12: Hyperlipidemia: Continue Lipitor 80 mg nightly             -Continue Zetia 10 mg daily 13: Low back pain/radiculopathy/neuropathy:              -Methadone 5 mg every other day, Was on this as OP, ? Prescriber              -Zanaflex 4 mg at nighttime -Following with Dr. Laurance Flatten -will schedule oxycodone 38m 3x per day as he says this was how he was using at home.  14: Tobacco use: Cessation counseling 15: Diabetes mellitus: CBGs before every meal, at bedtime.               -Continue Jardiance 10 mg before breakfast             -Continue sliding scale with meals  CBG (last 3)  Recent Labs    07/19/22 2051 07/20/22 0601 07/20/22 1135  GLUCAP 105* 121* 143*   Increase Jardiance to 247m -11/13 well controlled, continue current regimen 16: Gout: Continue colchicine 0.6 mg twice daily 17: Insomnia  -11/9 has been taking 50 mg as needed trazodone, I will schedule 75 mg dose  -improved continue trazodone [id number]. CKD III   -BUN 17/Cr 1.39 stable, continue to monitor LOS: 10 days A FACE TO FACE EVALUATION WAS  PERFORMED  YurJennye Boroughs/13/2023, 4:46 PM

## 2022-07-21 LAB — GLUCOSE, CAPILLARY
Glucose-Capillary: 141 mg/dL — ABNORMAL HIGH (ref 70–99)
Glucose-Capillary: 156 mg/dL — ABNORMAL HIGH (ref 70–99)
Glucose-Capillary: 166 mg/dL — ABNORMAL HIGH (ref 70–99)
Glucose-Capillary: 116 mg/dL — ABNORMAL HIGH (ref 70–99)

## 2022-07-21 NOTE — Progress Notes (Signed)
Physical Therapy Discharge Summary  Patient Details  Name: Thomas [name] MRN: [id number] Date of Birth: 12-Nov-1945  Date of Discharge from PT service:July 21, 2022  Today's Date: 07/21/2022 PT Individual Time: 1120-1200 PT Individual Time Calculation (min): 40 min    Patient has met 7 of 7 long term goals due to improved activity tolerance, improved balance, improved postural control, increased strength, ability to compensate for deficits, functional use of  left upper extremity and left lower extremity, improved attention, improved awareness, and improved coordination.  Patient to discharge at an ambulatory level Supervision using RW.  Patient's care partner is independent to provide the necessary physical assistance at discharge.  All goals met. Wheelchair goal not applicable because pt is D/Cing at an ambulatory level.  Recommendation:  Patient will benefit from ongoing skilled PT services in outpatient setting to continue to advance safe functional mobility, address ongoing impairments in L side weakness, dynamic standing balance, gait training using LRAD, and minimize fall risk.  Equipment: RW  Reasons for discharge: treatment goals met and discharge from hospital  Patient/family agrees with progress made and goals achieved: Yes  Skilled Therapeutic Interventions/Progress Updates:  Pt received supine in bed with nurse tech exiting upon therapist arrival. Pt agreeable to therapy session. Supine>sitting R EOB mod-I - pt initially requests to use bedrail but with min encouragement he was able to get to EOB without it. Sit<>stands using RW mod-I during session with pt demoing safe AD management. Gait training ~269f to ortho gym using RW with supervision and therapist providing w/c follow in event of fatigue (but not needed) - pt achieves reciprocal stepping pattern with adequate L LE foot clearance and no instances of LOB. Simulated ambulatory car transfer, small SUV height, using RW  with supervision for safety - pt places AD off to the side when going to turn and sit on car seat, therapist educated on proper AD management. Gait training up/down ramp using RW with supervision for safety - pt demos safe AD management over threshold. Transported back to his room and pt left seated in w/c with needs in reach and seat belt alarm on.  PT Discharge Precautions/Restrictions Precautions Precautions: Fall;Other (comment) Precaution Comments: mild L hemi Restrictions Weight Bearing Restrictions: No Pain Pain Assessment Pain Scale: 0-10 Pain Score: 8  Pain Type: Chronic pain Pain Location: Knee Pain Orientation: Left Pain Descriptors / Indicators: Aching Pain Onset: On-going Patients Stated Pain Goal: 0 Pain Intervention(s): Medication (See eMAR);Rest;Repositioned;Emotional support;Distraction Multiple Pain Sites: Yes 2nd Pain Site Pain Score: 8 Pain Type: Chronic pain Pain Location: Back Pain Descriptors / Indicators: Aching Pain Onset: On-going Patient's Stated Pain Goal: 0 Pain Intervention(s): Medication (See eMAR);Relaxation;Rest;Emotional support;Distraction Pain Interference Pain Interference Pain Effect on Sleep: 4. Almost constantly Pain Interference with Therapy Activities: 1. Rarely or not at all Pain Interference with Day-to-Day Activities: 2. Occasionally Vision/Perception  Vision - History Ability to See in Adequate Light: 0 Adequate Perception Perception: Within Functional Limits Praxis Praxis: Intact  Cognition  Overall Cognitive Status: Impaired/Different from baseline Arousal/Alertness: Awake/alert Orientation Level: Oriented to person;Oriented to place;Disoriented to time;Oriented to situation Year: 2023 Month: November Day of Week: Incorrect (requires choice of 3 to get it correct) Attention: Focused;Sustained Focused Attention: Appears intact Sustained Attention: Appears intact Memory: Impaired Awareness: Impaired Problem Solving:  Impaired Behaviors: Poor frustration tolerance Safety/Judgment: Impaired Sensation Sensation Light Touch: Impaired Detail Peripheral sensation comments: impaired on bottom of L foot but otherwise intact and symmetrical Hot/Cold: Not tested Proprioception: Appears Intact Stereognosis: Not tested  Coordination Gross Motor Movements are Fluid and Coordinated: Yes (in LEs) Motor  Motor Motor: Other (comment) Motor - Discharge Observations: significantly improved though still slight L hemiparesis (UE>LE)  Mobility Bed Mobility Bed Mobility: Supine to Sit;Sit to Supine Supine to Sit: Independent with assistive device Sit to Supine: Independent with assistive device Transfers Transfers: Sit to Stand;Stand to Sit;Stand Pivot Transfers Sit to Stand: Independent with assistive device Stand to Sit: Independent with assistive device Stand Pivot Transfers: Independent with assistive device Transfer (Assistive device): Rolling walker Locomotion  Gait Ambulation: Yes Gait Assistance: Supervision/Verbal cueing Gait Distance (Feet): 250 Feet Assistive device: Rolling walker Gait Assistance Details: Verbal cues for safe use of DME/AE;Verbal cues for precautions/safety Gait Gait: Yes Gait Pattern: Within Functional Limits Gait Pattern: Step-through pattern Gait velocity: decreased Stairs / Additional Locomotion Stairs: Yes Stairs Assistance: Supervision/Verbal cueing Stair Management Technique: Two rails;Step to pattern;Forwards Number of Stairs: 12 Height of Stairs: 6 Ramp: Supervision/Verbal cueing (using RW) Curb: Supervision/Verbal cueing (using RW) Pick up small object from the floor assist level: Supervision/Verbal cueing Pick up small object from the floor assistive device: RW Wheelchair Mobility Wheelchair Mobility: No  Trunk/Postural Assessment  Cervical Assessment Cervical Assessment: Exceptions to [facility] (forward head) Thoracic Assessment Thoracic Assessment: Exceptions to  [facility] (rounded shoulders) Lumbar Assessment Lumbar Assessment: Exceptions to [facility] (flexible posterior pelvic tilt) Postural Control Postural Control: Within Functional Limits (using RW support)  Balance Balance Balance Assessed: Yes Static Sitting Balance Static Sitting - Balance Support: Feet supported Static Sitting - Level of Assistance: 6: Modified independent (Device/Increase time) Dynamic Sitting Balance Dynamic Sitting - Balance Support: Feet supported Dynamic Sitting - Level of Assistance: 6: Modified independent (Device/Increase time) Static Standing Balance Static Standing - Balance Support: During functional activity;Bilateral upper extremity supported Static Standing - Level of Assistance: 6: Modified independent (Device/Increase time) Dynamic Standing Balance Dynamic Standing - Balance Support: During functional activity;Bilateral upper extremity supported Dynamic Standing - Level of Assistance: 6: Modified independent (Device/Increase time);5: Stand by assistance Extremity Assessment      RLE Assessment RLE Assessment: Exceptions to [facility] Strength Comments: assessed in sitting - did not apply resistance to hip flexion nor knee extension to avoid increased knee pain RLE Strength Right Hip Flexion: 3/5 Right Knee Flexion: 4-/5 Right Knee Extension: 3/5 Right Ankle Dorsiflexion: 3+/5 Right Ankle Plantar Flexion: 4-/5 LLE Assessment LLE Assessment: Exceptions to [facility] General Strength Comments: assessed in sitting - did not apply resistance to hip flexion nor knee extension to avoid increased knee pain LLE Strength Left Hip Flexion: 3/5 Left Knee Flexion: 4-/5 Left Knee Extension: 3/5 Left Ankle Dorsiflexion: 3+/5 Left Ankle Plantar Flexion: 4-/5   Lorece Keach M Larene Ascencio , PT, DPT, NCS, CSRS 07/21/2022, 7:59 AM

## 2022-07-21 NOTE — Progress Notes (Signed)
Physical Therapy Session Note  Patient Details  Name: Thomas [name] MRN: [id number] Date of Birth: [birth year]  Today's Date: 07/21/2022 PT Individual Time: 2130-8657 PT Individual Time Calculation (min): 46 min   Short Term Goals: Week 1:  PT Short Term Goal 1 (Week 1): Patient will perform bed/chair transfer with LRAD and MinA PT Short Term Goal 1 - Progress (Week 1): Met PT Short Term Goal 2 (Week 1): Patient will ambulate 47' with LRAD and MinA PT Short Term Goal 2 - Progress (Week 1): Met PT Short Term Goal 3 (Week 1): Patient will initiate curb training for safety entry into home PT Short Term Goal 3 - Progress (Week 1): Met Week 2:  PT Short Term Goal 1 (Week 2): =LTG due to ELOS  Skilled Therapeutic Interventions/Progress Updates:     Pt greeted supine in bed with wife present, agreeable to therapy and no c/o pain throughout session.   Wife present for family training today. Discussed wife's concerns of pt needing to amb down the hall to the bathroom. Suggested that night time bladder emptying can be done via urinal to increase safety and both pt and wife verbalized understanding.   Supine>sit with mod I using R UE support on bed railing. Pt able to don shoes with set up assist.   Pt able to amb to bathroom with RW ~45f, sit<>stand transfer with mod I,  pericare with mod i, handwashing with mod i.   Sit <>[facility] throughout session with mod i and pt demoing correct hand and foot placement and sequencing.   Gait training:  pt amb [id number] SPV and RW.  Pt demonstrating equal and adequate step and stride length , good heel strike, and safe RW management. Pt had 1 LOB during second 200ft 64f that required min assist for recovery. Pt lost balance due to reaching up to itch head while walking and became off balance. Pt was educated to stop walking and check balance before removing hand from RW. Pt verbalized understanding though required another  cue for correction later in the walk.   Step training:  4 inch step to simulate home set up. SPT demonstrated technique, RW management, and sequencing. Pt performed 2 sets ascending and 2 sets descending with SPV, good RW management, no LOB, no cues needed.   Car transfer: Talked wife and patient through car transfer sequencing including RW management both for wife and pt (where to store RW andChelseaow and when to retrieve when wife also uses RW). Pt performed with SPV, good RW management and no cuing needed for correction. Wife verbalized understanding.   Pt and wife stating they had no further questions at this time.   Pt left supine in bed with wife present, bed alarm on, call bell in reach, all needs met.   Therapy Documentation Precautions:  Precautions Precautions: Fall, Other (comment) Precaution Comments: mild L hemi Restrictions Weight Bearing Restrictions: No  Therapy/Group: Individual Therapy  Khair Chasteen KKayleen Memos11/14/2023, 3:13 PM

## 2022-07-21 NOTE — Progress Notes (Signed)
PROGRESS NOTE   Subjective/Complaints:  Reports chronic L knee pain with ambulation.Has hx Right TKR, discussed hold on elective surgery for 6 mo post CVA   ROS: Patient denies CP, SOB, N/V/D,Constipation, headache, abdominal pain, cough, vision changes,  Positive for insomnia    Objective:   DG Knee 1-2 Views Left  Result Date: 07/20/2022 CLINICAL DATA:  Knee pain EXAM: LEFT KNEE - 1-2 VIEW COMPARISON:  Knee radiograph 04/27/2022 FINDINGS: There is no evidence of acute fracture. There is tricompartment osteophyte formation with severe medial compartment joint space narrowing. No significant joint effusion. Vascular calcifications. IMPRESSION: No evidence of acute fracture. Tricompartment osteoarthritis, severe in the medial compartment. Electronically Signed   By: Maurine Simmering M.D.   On: 07/20/2022 19:35   Recent Labs    07/20/22 0508  WBC 5.8  HGB 12.5*  HCT 34.3*  PLT 193     Recent Labs    07/20/22 0508  NA 137  K 3.8  CL 105  CO2 23  GLUCOSE 126*  BUN 17  CREATININE 1.39*  CALCIUM 8.8*     Intake/Output Summary (Last 24 hours) at 07/21/2022 0857 Last data filed at 07/21/2022 0757 Gross per 24 hour  Intake 710 ml  Output --  Net 710 ml         Physical Exam: Vital Signs Blood pressure (!) 173/76, pulse 69, temperature 98.3 F (36.8 C), temperature source Oral, resp. rate 18, height 5' 11" (1.803 m), weight 92.9 kg, SpO2 99 %.   General: No acute distress, seen at bedside Mood and affect are appropriate, pleasant, jovial  Heart: Regular rate and rhythm no rubs murmurs or extra sounds, no JVD Lungs: Clear to auscultation, breathing unlabored, no rales or wheezes, good air movement Abdomen: Positive bowel sounds, soft nontender to palpation, nondistended Extremities: No clubbing, cyanosis, or edema Skin: Warm dry no breakdown noted   Neuro:  fair insight and awareness. Normal language, sl  dysarthria. Functional memory. RUE 5/5. LUE 3+/5 prox to distal. RLE 4+/5 prox to distal. LLE 2/5 prox to 4/5 distally. No LTsensory deficits but has reduced proprioception in the left hand . Decreased FMC of left hand and leg. No abnl tone. Mild dysmetria LUE  Musculoskeletal: Full ROM L knee, no effusion or erythema, left knee joint line tenderness,  pain with L hip int rotation, no pain with ext rot   Assessment/Plan: 1. Functional deficits which require 3+ hours per day of interdisciplinary therapy in a comprehensive inpatient rehab setting. Physiatrist is providing close team supervision and 24 hour management of active medical problems listed below. Physiatrist and rehab team continue to assess barriers to discharge/monitor patient progress toward functional and medical goals  Care Tool:  Bathing    Body parts bathed by patient: Right arm, Left arm, Chest, Abdomen, Front perineal area, Right upper leg, Left upper leg, Face   Body parts bathed by helper: Right lower leg, Left lower leg, Buttocks     Bathing assist Assist Level: Minimal Assistance - Patient > 75%     Upper Body Dressing/Undressing Upper body dressing   What is the patient wearing?: Pull over shirt    Upper body assist Assist Level:  Minimal Assistance - Patient > 75%    Lower Body Dressing/Undressing Lower body dressing      What is the patient wearing?: Underwear/pull up, Pants     Lower body assist Assist for lower body dressing: Moderate Assistance - Patient 50 - 74%     Toileting Toileting    Toileting assist Assist for toileting: Contact Guard/Touching assist Assistive Device Comment: urinal   Transfers Chair/bed transfer  Transfers assist     Chair/bed transfer assist level: Supervision/Verbal cueing Chair/bed transfer assistive device: Programmer, multimedia   Ambulation assist      Assist level: Contact Guard/Touching assist Assistive device: Walker-rolling Max  distance: 125   Walk 10 feet activity   Assist     Assist level: Contact Guard/Touching assist Assistive device: Walker-rolling   Walk 50 feet activity   Assist Walk 50 feet with 2 turns activity did not occur: Safety/medical concerns (Patient unable to ambulate >15' and assess stair, curb, ramp mobility at this time secondary to reports of increased B knee pain, impaired endurance, decreased global strenght)  Assist level: Contact Guard/Touching assist Assistive device: Walker-rolling    Walk 150 feet activity   Assist Walk 150 feet activity did not occur: Safety/medical concerns         Walk 10 feet on uneven surface  activity   Assist Walk 10 feet on uneven surfaces activity did not occur: Safety/medical concerns         Wheelchair     Assist Is the patient using a wheelchair?: Yes Type of Wheelchair: Manual    Wheelchair assist level: Supervision/Verbal cueing Max wheelchair distance: 150'    Wheelchair 50 feet with 2 turns activity    Assist        Assist Level: Supervision/Verbal cueing   Wheelchair 150 feet activity     Assist      Assist Level: Supervision/Verbal cueing   Blood pressure (!) 173/76, pulse 69, temperature 98.3 F (36.8 C), temperature source Oral, resp. rate 18, height 5' 11" (1.803 m), weight 92.9 kg, SpO2 99 %.  Medical Problem List and Plan: 1. 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              -30 day cardiac event monitoring recommended as outpt by neuro             -patient may shower             -ELOS/Goals: 07/22/2022, supervision/mod I  -Continue CIR therapies including PT, OT and SLP  -Discussed dc date planned for 07/22/22 with patient 2.  Antithrombotics: -DVT/anticoagulation:  Pharmaceutical: Lovenox start             -antiplatelet therapy: Plavix and aspirin daily for minimum 6 months 3.  Pain Management: Tylenol as needed.  Has oxycodone as needed but not using             -See #13 below  Left knee pain with severe OA medial compartment Hx R TKR, discussed elective surgery should wait 6 mo due to recent CVA   Hip pain no evidence of sig OA or fx on xray  4. Mood/Behavior/Sleep: LCSW to evaluate and team to provide emotional support             -antipsychotic agents: n/a             -Continue Cymbalta 60 mg every evening  -await neuropsych  5. Neuropsych/cognition: This patient is capable of making decisions on his own behalf. 6. Skin/Wound Care: Routine skin care checks 7. Fluids/Electrolytes/Nutrition: Strict I's and O's and follow-up chemistries             -2 g sodium diet 8: CAD status post PCI 10/31 overlapping DES to LAD             -Plavix and aspirin for at least 6 months             -Continue lipid lowering therapy             -Follow-up with Dr. Virgina Jock  -Denies chest pain continue to monitor 9: PAD: Severe claudication status post bilateral lower extremity arteriogram             -Follow-up as outpatient with Dr. Virgina Jock 10: Heart failure with reduced ejection fraction:              -Weigh patient daily             -Continue Imdur 30 mg daily             -Continue Norvasc 10 mg daily             -Continue Toprol-XL 25 mg daily  -11/14, weights appears stable, continue to monitor Filed Weights   07/17/22 0606 07/19/22 0500 07/20/22 0545  Weight: 93.7 kg 92.4 kg 92.9 kg    11: OAB: continue Myrbetriq, Flomax 12: Hyperlipidemia: Continue Lipitor 80 mg nightly             -Continue Zetia 10 mg daily 13: Low back pain/radiculopathy/neuropathy:              -Methadone 5 mg every other day, Was on this as OP, ? Prescriber              -Zanaflex 4 mg at nighttime -Following with Dr. Laurance Flatten -will schedule oxycodone 68m 3x per day as he says this was how he was using at home.  14: Tobacco use: Cessation counseling 15: Diabetes mellitus: CBGs before  every meal, at bedtime.               -Continue Jardiance 10 mg before breakfast             -Continue sliding scale with meals  CBG (last 3)  Recent Labs    07/20/22 1652 07/20/22 2103 07/21/22 0611  GLUCAP 167* 137* 141*   Increase Jardiance to 235m -11/13 well controlled, continue current regimen 16: Gout: Continue colchicine 0.6 mg twice daily 17: Insomnia  -11/9 has been taking 50 mg as needed trazodone, I will schedule 75 mg dose  -improved continue trazodone [id number]. CKD III   -BUN 17/Cr 1.39 stable, continue to monitor LOS: 11 days A FACE TO FACE EVALUATION WAS PERFORMED  AndCharlett Blake/14/2023, 8:57 AM

## 2022-07-21 NOTE — Progress Notes (Addendum)
Patient ID: Thomas [name], male   DOB: October 20, 1945, 76 y.o.   MRN: [id number]  OP referral sen to OP at [place]: [phone]

## 2022-07-21 NOTE — Progress Notes (Signed)
Speech Language Pathology Discharge Summary  Patient Details  Name: Thomas [name] MRN: [id number] Date of Birth: 17-Mar-1946  Date of Discharge from SLP service:July 21, 2022  Today's Date: 07/21/2022 SLP Individual Time: 1000-1030 SLP Individual Time Calculation (min): 30 min  Skilled Therapeutic Interventions:  Skilled ST treatment focused on cognitive goals. SLP assessed pt's cognitive-linguistic skills with the SLUMS. Pt scored 10/30 (n=27) which is below the average range however suspect scores may not reflect true level of performance due to visible resistance toward participating in assessment. Pt also appeared to minimize errors with humor and attempts to distract SLP. Pt required min A for problem solving scenarios in room (e.g., using call bell, using phone to contact family/friends). Sup A verbal cues needed for use of external memory aid to identify information on daily schedule. SLP educated on strategies to maximize attention and memory, and reinforced recommendations for supervision with iADLs at discharge. Pt verbalized understanding and agreement.  Patient was left in wheelchair with alarm activated and immediate needs within reach at end of session.   Patient has met 3 of 4 long term goals.  Patient to discharge at [facility] level.  Reasons goals not met: Pt requires increased assistance for problem solving skills   Clinical Impression/Discharge Summary: Patient has made functional gains and has met 3 of 4 long-term goals this admission. Patient is currently completing functional and mildly complex cognitive tasks with sup-to-min A cues in regards to attention, recall with use of compensatory aids, and awareness. Pt requiring increased support with higher level problem solving skills. Pt/spouse feel pt is near cognitive baseline. Patient and family education is complete and patient to discharge at overall min A level. Patient's care partner is independent to provide the  necessary physical and cognitive assistance at discharge. Patient would benefit from continued SLP services in home health setting to maximize cognitive-linguistic function and functional independence. Pt is recommended to have assistance with all iADLs and 24 hour support at discharge.    Care Partner:  Caregiver Able to Provide Assistance: Yes  Type of Caregiver Assistance: Physical;Cognitive  Recommendation:  Home Health SLP;24 hour supervision/assistance  Rationale for SLP Follow Up: Maximize cognitive function and independence   Equipment: none   Reasons for discharge: Discharged from hospital   Patient/Family Agrees with Progress Made and Goals Achieved: Yes    [name] [name] 07/21/2022, 12:45 PM

## 2022-07-21 NOTE — Progress Notes (Signed)
Occupational Therapy Discharge Summary  Patient Details  Name: Thomas [name] MRN: [id number] Date of Birth: [birth year]  Date of Discharge from OT service:July 21, 2022  Today's Date: 07/21/2022 OT Individual Time: 0800-0900 OT Individual Time Calculation (min): 60 min    Patient has met 8 of 8 long term goals due to improved activity tolerance, improved balance, postural control, ability to compensate for deficits, functional use of  LEFT upper and LEFT lower extremity, improved awareness, and improved coordination.  Patient to discharge at overall Modified Independent level.  Patient's care partner is independent to provide the necessary physical and cognitive assistance at discharge.    Reasons goals not met: All goals met   Recommendation:  Patient will benefit from ongoing skilled OT services in outpatient setting to continue to advance functional skills in the area of BADL, iADL, and Reduce care partner burden.  Equipment: No equipment provided  Reasons for discharge: treatment goals met and discharge from hospital  Patient/family agrees with progress made and goals achieved: Yes  Skilled Therapeutic Interventions/Progress Updates:  Pt received resting in bed I good spirits receptive to skilled OT session. Pt reporting 0/10 pain. Pt supine>EOB mod I with HOB slightly elevated. Pt ambulated to bathroom and completed 3/3 toileting tasks with mod I. Pt transported to therapy gym for shower in tub.shower in preparation for DC home. Pt completed U/LB bathing seated on tub bench with mod I, standing to clean bottom while holding onto grab bars. Pt mod I transfer <>tub bench. Pt completed U/LB dressing seated on chair with mod I. Pt SOB following activity provided rest break. Pt transported back to room total A in wc. Pt completed grooming task- brushing teeth, brushing hair, and applying deodorant while standing at sink with RW. Pt was left resting in bed with bed alarm on, call bell in  reach, and all needs met.   OT Discharge Precautions/Restrictions  Precautions Precautions: Fall;Other (comment) Precaution Comments: mild L hemi Restrictions Weight Bearing Restrictions: No General   Vital Signs Therapy Vitals Temp: 97.7 F (36.5 C) Temp Source: Oral Pulse Rate: (Abnormal) 56 Resp: 16 BP: 134/64 Patient Position (if appropriate): Lying Oxygen Therapy SpO2: 96 % O2 Device: Room Air Pain   ADL ADL Eating: Independent Where Assessed-Eating:  (based on observation) Grooming: Modified independent Where Assessed-Grooming: Standing at sink Upper Body Bathing: Modified independent Where Assessed-Upper Body Bathing: Shower Lower Body Bathing: Modified independent Where Assessed-Lower Body Bathing: Shower Upper Body Dressing: Modified independent (Device) Where Assessed-Upper Body Dressing: Edge of bed Lower Body Dressing: Modified independent Where Assessed-Lower Body Dressing: Edge of bed Toileting: Modified independent Where Assessed-Toileting: Risk analyst Method: Counselling psychologist: Other (comment) Tub/Shower Transfer: Modified independent Clinical cytogeneticist Method: Optometrist: Civil engineer, contracting without back Social research officer, government: Modified independent Social research officer, government Method: Heritage manager: Civil engineer, contracting with back Vision Baseline Vision/History: 1 Wears glasses Patient Visual Report: No change from baseline Vision Assessment?: No apparent visual deficits Perception  Perception: Within Functional Limits Praxis Praxis: Intact Cognition Cognition Overall Cognitive Status: Impaired/Different from baseline Arousal/Alertness: Awake/alert Orientation Level: Person;Place;Situation Person: Oriented Place: Oriented Situation: Oriented Memory: Impaired Memory Impairment: Decreased recall of new information;Decreased short term  memory Decreased Short Term Memory: Verbal basic Attention: Focused;Sustained Focused Attention: Appears intact Sustained Attention: Appears intact Sustained Attention Impairment: Verbal basic;Functional basic Awareness: Impaired Awareness Impairment: Emergent impairment Problem Solving: Impaired Problem Solving Impairment: Verbal basic;Functional basic Behaviors: Poor frustration tolerance Safety/Judgment: Impaired Brief Interview for  Mental Status (BIMS) Repetition of Three Words (First Attempt): 3 Temporal Orientation: Year: Correct Temporal Orientation: Month: Accurate within 5 days Temporal Orientation: Day: Correct Recall: "Sock": Yes, no cue required Recall: "Blue": Yes, no cue required Recall: "Bed": Yes, no cue required BIMS Summary Score: 15 Sensation Sensation Light Touch: Impaired Detail Peripheral sensation comments: impaired on bottom of L foot but otherwise intact and symmetrical Hot/Cold: Not tested Proprioception: Appears Intact Stereognosis: Not tested Additional Comments: L LE is more dull than R LE (below the knee) Coordination Gross Motor Movements are Fluid and Coordinated: Yes Fine Motor Movements are Fluid and Coordinated: Yes Finger Nose Finger Test: Smooth motor movements, no dysmetria noted Motor  Motor Motor: Other (comment) Motor - Discharge Observations: significantly improved though still slight L hemiparesis (UE>LE) Mobility  Bed Mobility Bed Mobility: Supine to Sit;Sit to Supine Rolling Right: Independent with assistive device Rolling Left: Independent with assistive device Supine to Sit: Independent with assistive device Sit to Supine: Independent with assistive device Transfers Sit to Stand: Independent with assistive device Stand to Sit: Independent with assistive device  Trunk/Postural Assessment  Cervical Assessment Cervical Assessment: Exceptions to [facility] (forward head) Thoracic Assessment Thoracic Assessment: Exceptions to [facility]  (rounded shoulders) Lumbar Assessment Lumbar Assessment: Exceptions to [facility] Postural Control Postural Control: Within Functional Limits  Balance Balance Balance Assessed: Yes Static Sitting Balance Static Sitting - Balance Support: Feet supported Static Sitting - Level of Assistance: 6: Modified independent (Device/Increase time) Dynamic Sitting Balance Dynamic Sitting - Balance Support: Feet supported Dynamic Sitting - Level of Assistance: 6: Modified independent (Device/Increase time) Dynamic Sitting - Balance Activities: Forward lean/weight shifting Static Standing Balance Static Standing - Balance Support: During functional activity;Bilateral upper extremity supported Static Standing - Level of Assistance: 6: Modified independent (Device/Increase time) Dynamic Standing Balance Dynamic Standing - Balance Support: During functional activity;Bilateral upper extremity supported Dynamic Standing - Level of Assistance: 6: Modified independent (Device/Increase time);5: Stand by assistance Dynamic Standing - Balance Activities: Forward lean/weight shifting Extremity/Trunk Assessment RUE Assessment RUE Assessment: Within Functional Limits LUE Assessment LUE Assessment: Exceptions to [facility] Passive Range of Motion (PROM) Comments: WFL Active Range of Motion (AROM) Comments: Limited to ~130 degrees General Strength Comments: 4-/5   Janey Genta 07/21/2022, 4:17 PM

## 2022-07-21 NOTE — Plan of Care (Signed)
  Problem: RH Balance Goal: LTG: Patient will maintain dynamic sitting balance (OT) Description: LTG:  Patient will maintain dynamic sitting balance with assistance during activities of daily living (OT) Outcome: Completed/Met   Problem: Sit to Stand Goal: LTG:  Patient will perform sit to stand in prep for activites of daily living with assistance level (OT) Description: LTG:  Patient will perform sit to stand in prep for activites of daily living with assistance level (OT) Outcome: Completed/Met   Problem: RH Dressing Goal: LTG Patient will perform upper body dressing (OT) Description: LTG Patient will perform upper body dressing with assist, with/without cues (OT). Outcome: Completed/Met   Problem: RH Toileting Goal: LTG Patient will perform toileting task (3/3 steps) with assistance level (OT) Description: LTG: Patient will perform toileting task (3/3 steps) with assistance level (OT)  Outcome: Completed/Met   Problem: RH Functional Use of Upper Extremity Goal: LTG Patient will use RT/LT upper extremity as a (OT) Description: LTG: Patient will use right/left upper extremity as a stabilizer/gross assist/diminished/nondominant/dominant level with assist, with/without cues during functional activity (OT) Outcome: Completed/Met   Problem: RH Toilet Transfers Goal: LTG Patient will perform toilet transfers w/assist (OT) Description: LTG: Patient will perform toilet transfers with assist, with/without cues using equipment (OT) Outcome: Completed/Met   Problem: RH Tub/Shower Transfers Goal: LTG Patient will perform tub/shower transfers w/assist (OT) Description: LTG: Patient will perform tub/shower transfers with assist, with/without cues using equipment (OT) Outcome: Completed/Met

## 2022-07-21 NOTE — Discharge Summary (Signed)
Physician Discharge Summary  Patient ID: Thomas [name] MRN: [id number] DOB/AGE: 03-21-1946 76 y.o.  Admit date: 07/10/2022 Discharge date: 07/22/2022  Discharge Diagnoses:  Principal Problem:   Acute ischemic stroke [facility]) Active problems: CAD PAD Heart failure with reduced EF OAB Hyperlipidemia Chronic back pain Tobacco use DM Gout Anxiety Left knee pain  Discharged Condition: good  Significant Diagnostic Studies: DG Knee 1-2 Views Left  Result Date: 07/20/2022 CLINICAL DATA:  Knee pain EXAM: LEFT KNEE - 1-2 VIEW COMPARISON:  Knee radiograph 04/27/2022 FINDINGS: There is no evidence of acute fracture. There is tricompartment osteophyte formation with severe medial compartment joint space narrowing. No significant joint effusion. Vascular calcifications. IMPRESSION: No evidence of acute fracture. Tricompartment osteoarthritis, severe in the medial compartment. Electronically Signed   By: Maurine Simmering M.D.   On: 07/20/2022 19:35   DG HIP UNILAT WITH PELVIS 2-3 VIEWS LEFT  Result Date: 07/14/2022 CLINICAL DATA:  Left hip pain. EXAM: DG HIP (WITH OR WITHOUT PELVIS) 2-3V LEFT COMPARISON:  None Available. FINDINGS: The left hip is normally located. Mild age related degenerative changes. No plain film evidence of stress fracture or AVN. Moderate vascular calcifications. IMPRESSION: Mild age related degenerative changes but no acute bony findings. Electronically Signed   By: Marijo Sanes M.D.   On: 07/14/2022 12:59    Labs:  Basic Metabolic Panel: Recent Labs  Lab 07/20/22 0508  NA 137  K 3.8  CL 105  CO2 23  GLUCOSE 126*  BUN 17  CREATININE 1.39*  CALCIUM 8.8*    CBC: Recent Labs  Lab 07/20/22 0508  WBC 5.8  HGB 12.5*  HCT 34.3*  MCV 89.3  PLT 193    CBG: Recent Labs  Lab 07/21/22 0611 07/21/22 1124 07/21/22 1633 07/21/22 2106 07/22/22 0553  GLUCAP 141* 156* 166* 116* 145*    Brief HPI:   Thomas [name] is a 76 y.o. male with a history of coronary  artery disease.  He was seen on 07/01/2022 by Dr. Shellia Carwin and arrangements made for left heart cath with intervention.  He presented to the Cath Lab on 10/31 and underwent PCI with drug-eluting stents to the LAD.  Recommendations are for Plavix and aspirin daily for minimum of 6 months.  He was admitted for observation.  On 11/1, he complained of left hand numbness and weakness.  He reported left hand numbness for approximately 3 days.  Rapid response team was notified and stat CT of the head was performed.  MRI of the brain showed bilateral embolic shower, right greater than left.  MRA of the head with bilateral M2 and P2 stenoses, left siphon and atherosclerosis.  Carotid Doppler unremarkable.  On exam, he had mild left facial droop left upper extremity and left proximal lower extremity weakness.  Neurology reported infarct most likely related to cardiac procedure although other cardioembolic sources cannot be completely ruled out.  Echo with ejection fraction 50 to 55%.  Recommend 30-day cardiac event monitoring as outpatient.    Hospital Course: Thomas [name] was admitted to rehab 07/10/2022 for inpatient therapies to consist of PT, ST and OT at least three hours five days a week. Past admission physiatrist, therapy team and rehab RN have worked together to provide customized collaborative inpatient rehab. Complained of hip pain and x-rays on 11/8 without significant OA or fracture.  Treated with trazodone for insomnia and increased to 75 mg dosing on 11/9. Reported chronic right knee pain on 11/13. Voltaren gel prescribed and x-ray obtained>>severe OA medial  compartment. Discussed elective surgery should wait 6 months due to recent CVA. CKD 3 with stable serum creatinine at 1.39 on 11/13. Patient reported "going downstairs" overnight on 11/15 and dicussed with wife. Will discontinue trazodone. Reinforced supervision/safety measures at home.  Blood pressures were monitored on TID basis and weight remained  stable on Imdur 30 mg daily, Norvasc 10 mg daily and Toprol-XL 25 mg daily.  Diabetes has been monitored with ac/hs CBG checks and SSI was use prn for tighter BS control. Jardiance 10 mg daily continued. This was increased to 25 mg on 11/9.  Rehab course: During patient's stay in rehab weekly team conferences were held to monitor patient's progress, set goals and discuss barriers to discharge. At admission, patient required mod assist with mobility and basic self-care skills.  He has had improvement in activity tolerance, balance, postural control as well as ability to compensate for deficits. He has had improvement in functional use RUE/LUE  and RLE/LLE as well as improvement in awareness. Pt transfers bed > w/c w/ supervision and RW, occasional cues for safety in confined spaces 11/13.   Disposition: Home Discharge disposition: 01-Home or Self Care      Diet: Carb modified  Special Instructions: No driving, alcohol consumption or tobacco use. Reinforced no driving and supervision with mobility at home.  Advised to wait 6 months post CVA for any elective surgery.  Plan Plavix and aspirin for a least 6 months per neurology  Discharge Instructions     Ambulatory referral to Neurology   Complete by: As directed    An appointment is requested in approximately: 4 weeks   Ambulatory referral to Physical Medicine Rehab   Complete by: As directed    Hospital follow-up   Discharge patient   Complete by: As directed    Discharge disposition: 01-Home or Self Care   Discharge patient date: 07/22/2022      Allergies as of 07/22/2022       Reactions   Wasp Venom Anaphylaxis, Itching, Swelling        Medication List     STOP taking these medications    Entresto 97-103 MG Generic drug: sacubitril-valsartan   glipiZIDE 10 MG tablet Commonly known as: GLUCOTROL   metFORMIN 1000 MG tablet Commonly known as: GLUCOPHAGE       TAKE these medications    acetaminophen 325 MG  tablet Commonly known as: TYLENOL Take 1-2 tablets (325-650 mg total) by mouth every 4 (four) hours as needed for mild pain.   amLODipine 10 MG tablet Commonly known as: NORVASC Take 10 mg by mouth in the morning. Heart Medication   aspirin 81 MG chewable tablet Chew 81 mg by mouth daily at 12 noon. Heart medication   atorvastatin 80 MG tablet Commonly known as: LIPITOR Take 1 tablet (80 mg total) by mouth at bedtime. What changed:  how much to take additional instructions   Calcium Carbonate-Vitamin D 600-400 MG-UNIT tablet Take 1 tablet by mouth 2 (two) times daily.   cetirizine 10 MG tablet Commonly known as: ZYRTEC Take 10 mg by mouth daily as needed for allergies.   clopidogrel 75 MG tablet Commonly known as: PLAVIX Take 1 tablet (75 mg total) by mouth daily with breakfast. Start taking on: July 23, 2022   colchicine 0.6 MG tablet Take 1 tablet (0.6 mg total) by mouth 2 (two) times daily.   diclofenac Sodium 1 % Gel Commonly known as: VOLTAREN Apply 1 application topically 4 (four) times daily as needed (pain).  DULoxetine 60 MG capsule Commonly known as: CYMBALTA Take 1 capsule (60 mg total) by mouth every evening.   empagliflozin 25 MG Tabs tablet Commonly known as: JARDIANCE Take 1 tablet (25 mg total) by mouth daily before breakfast. Start taking on: July 23, 2022 What changed:  medication strength how much to take   EPINEPHrine 0.3 mg/0.3 mL Soaj injection Commonly known as: EPI-PEN Inject 0.3 mg into the muscle daily as needed (for anaphylaxis).   ezetimibe 10 MG tablet Commonly known as: ZETIA Take 1 tablet (10 mg total) by mouth daily.   furosemide 40 MG tablet Commonly known as: LASIX Take 1 tablet (40 mg total) by mouth daily.   isosorbide mononitrate 30 MG 24 hr tablet Commonly known as: IMDUR Take 1 tablet (30 mg total) by mouth daily.   methadone 5 MG tablet Commonly known as: DOLOPHINE Take 5 mg by mouth every other day.  For severe chronic pain   metoprolol succinate 25 MG 24 hr tablet Commonly known as: Toprol XL Take 1 tablet (25 mg total) by mouth daily.   nitroGLYCERIN 0.4 MG SL tablet Commonly known as: NITROSTAT Place 1 tablet (0.4 mg total) under the tongue every 5 (five) minutes as needed for chest pain.   oxyCODONE 5 MG immediate release tablet Commonly known as: Oxy IR/ROXICODONE Take 5 mg by mouth 4 (four) times daily as needed (severe chronic pain).   tamsulosin 0.4 MG Caps capsule Commonly known as: FLOMAX Take 1 capsule (0.4 mg total) by mouth at bedtime.   tiZANidine 4 MG tablet Commonly known as: ZANAFLEX Take 1 tablet (4 mg total) by mouth at bedtime.   Varenicline Tartrate (Starter) 0.5 MG X 11 & 1 MG X 42 Tbpk Commonly known as: Chantix Starting Month Pak Take 1 tablet by mouth daily.   Vibegron 75 MG Tabs Take 75 mg by mouth daily.        Follow-up Information     Rudd, Lillette Boxer, MD. Go to.   Specialty: Family Medicine Why: 07/29/2022 Contact information: [place] [address] [phone]         Nigel Mormon, MD Follow up.   Specialties: Cardiology, Radiology Why: Call the office in 1-2 days to make arrangements for hospital follow-up. Contact information: [place] [address] [facility] Follow up.   Why: Call the office in 1-2 days to make arrangements for hospital follow-up. Contact information: [address]     [address] [place] [address] [phone]        Charlett Blake, MD Follow up.   Specialty: Physical Medicine and Rehabilitation Why: office will call you to arrange your appt (sent) Contact information: [place] [address] [phone]                 Signed: Barbie Banner 07/22/2022, 10:46 AM

## 2022-07-21 NOTE — Progress Notes (Signed)
Occupational Therapy Session Note  Patient Details  Name: Thomas [name] MRN: [id number] Date of Birth: 1945-09-20  Today's Date: 07/21/2022 OT Individual Time: 1029-1101 OT Individual Time Calculation (min): 32 min    Short Term Goals: Week 1:  OT Short Term Goal 1 (Week 1): Pt will improve UB strength to 4+/5 MMT for safe and I task performance. OT Short Term Goal 1 - Progress (Week 1): Progressing toward goal OT Short Term Goal 2 (Week 1): Pt with complete UB/LB dressing with Ind OT Short Term Goal 2 - Progress (Week 1): Progressing toward goal OT Short Term Goal 3 (Week 1): Patient will complete all functional transfers with ModI OT Short Term Goal 3 - Progress (Week 1): Met OT Short Term Goal 5 (Week 1): STG=LTG  Skilled Therapeutic Interventions/Progress Updates:  Pt greeted seated in w/c, pt agreeable to OT intervention. Session focus on functional reaching with LUE and functional grasp.  Total a transport to/from gym for time mgmt. Pt instructed to grasp horseshoes and hang horseshoes over ledge of board with LUE, pt needed cues to slow down task as pt quickly hurrying through trials. Education provided on importance of breaking down each movement in order to facilitate improved motor planning.   Graded task up and had pt place Clothespins on ledge of board with an emphasis on pincer grasp, pt again required cues to break down movements to improve motor planning.   Instructed pt to complete same task but this time using cylindrical grasp to place clothespin on L lateral side of board to further challenge in hand manipulation skills.  Pt with decreased coordination when rotating L wrist.   Ended session with pt seated in w/c with alarm belt activated and all needs within reach.   Therapy Documentation Precautions:  Precautions Precautions: Fall, Other (comment) Precaution Comments: mild L hemi Restrictions Weight Bearing Restrictions: No  Pain: Unrated pain reported in  back and knee, rest breaks provided as needed with session conducted from sitting for pain mgmt.    Therapy/Group: Individual Therapy  Corinne Ports [facility] 07/21/2022, 12:08 PM

## 2022-07-21 NOTE — Plan of Care (Signed)
  Problem: RH Problem Solving Goal: LTG Patient will demonstrate problem solving for (SLP) Description: LTG:  Patient will demonstrate problem solving for basic/complex daily situations with cues  (SLP) Outcome: Not Met (add Reason)   Problem: RH Memory Goal: LTG Patient will use memory compensatory aids to (SLP) Description: LTG:  Patient will use memory compensatory aids to recall biographical/new, daily complex information with cues (SLP) Outcome: Completed/Met   Problem: RH Attention Goal: LTG Patient will demonstrate this level of attention during functional activites (SLP) Description: LTG:  Patient will will demonstrate this level of attention during functional activites (SLP) Outcome: Completed/Met   Problem: RH Awareness Goal: LTG: Patient will demonstrate awareness during functional activites type of (SLP) Description: LTG: Patient will demonstrate awareness during functional activites type of (SLP) Outcome: Completed/Met

## 2022-07-22 ENCOUNTER — Other Ambulatory Visit (HOSPITAL_COMMUNITY): Payer: Self-pay

## 2022-07-22 LAB — GLUCOSE, CAPILLARY: Glucose-Capillary: 145 mg/dL — ABNORMAL HIGH (ref 70–99)

## 2022-07-22 MED ORDER — NITROGLYCERIN 0.4 MG SL SUBL: 0.4000 mg | SUBLINGUAL_TABLET | SUBLINGUAL | 3 refills | Status: DC | PRN

## 2022-07-22 MED ORDER — EMPAGLIFLOZIN 25 MG PO TABS: 25.0000 mg | ORAL_TABLET | Freq: Every day | ORAL | 0 refills | Status: DC

## 2022-07-22 MED ORDER — CLOPIDOGREL BISULFATE 75 MG PO TABS: 75.0000 mg | ORAL_TABLET | Freq: Every day | ORAL | 0 refills | Status: DC

## 2022-07-22 MED ORDER — ACETAMINOPHEN 325 MG PO TABS: 325.0000 mg | ORAL_TABLET | ORAL | Status: DC | PRN

## 2022-07-22 MED ORDER — EZETIMIBE 10 MG PO TABS: 10.0000 mg | ORAL_TABLET | Freq: Every day | ORAL | 1 refills | Status: DC

## 2022-07-22 MED ORDER — TIZANIDINE HCL 4 MG PO TABS: 4.0000 mg | ORAL_TABLET | Freq: Every day | ORAL | 0 refills | Status: DC

## 2022-07-22 MED ORDER — NITROGLYCERIN 0.4 MG SL SUBL: 0.4000 mg | SUBLINGUAL_TABLET | SUBLINGUAL | 0 refills | Status: AC | PRN

## 2022-07-22 MED ORDER — CLOPIDOGREL BISULFATE 75 MG PO TABS: 75.0000 mg | ORAL_TABLET | Freq: Every day | ORAL | 1 refills | Status: DC

## 2022-07-22 MED ORDER — TAMSULOSIN HCL 0.4 MG PO CAPS: 0.4000 mg | ORAL_CAPSULE | Freq: Every day | ORAL | 0 refills | Status: DC

## 2022-07-22 MED ORDER — EZETIMIBE 10 MG PO TABS: 10.0000 mg | ORAL_TABLET | Freq: Every day | ORAL | 0 refills | Status: DC

## 2022-07-22 MED ORDER — DULOXETINE HCL 60 MG PO CPEP: 60.0000 mg | ORAL_CAPSULE | Freq: Every evening | ORAL | 0 refills | Status: DC

## 2022-07-22 MED ORDER — VIBEGRON 75 MG PO TABS: 75.0000 mg | ORAL_TABLET | Freq: Every day | ORAL | 0 refills | Status: DC

## 2022-07-22 MED ORDER — ATORVASTATIN CALCIUM 80 MG PO TABS: 80.0000 mg | ORAL_TABLET | Freq: Every day | ORAL | 0 refills | Status: DC

## 2022-07-22 NOTE — Progress Notes (Signed)
PROGRESS NOTE   Subjective/Complaints:    ROS: Patient denies CP, SOB, N/V/D,Constipation, headache, abdominal pain, cough, vision changes,  Positive for insomnia    Objective:   DG Knee 1-2 Views Left  Result Date: 07/20/2022 CLINICAL DATA:  Knee pain EXAM: LEFT KNEE - 1-2 VIEW COMPARISON:  Knee radiograph 04/27/2022 FINDINGS: There is no evidence of acute fracture. There is tricompartment osteophyte formation with severe medial compartment joint space narrowing. No significant joint effusion. Vascular calcifications. IMPRESSION: No evidence of acute fracture. Tricompartment osteoarthritis, severe in the medial compartment. Electronically Signed   By: Maurine Simmering M.D.   On: 07/20/2022 19:35   Recent Labs    07/20/22 0508  WBC 5.8  HGB 12.5*  HCT 34.3*  PLT 193     Recent Labs    07/20/22 0508  NA 137  K 3.8  CL 105  CO2 23  GLUCOSE 126*  BUN 17  CREATININE 1.39*  CALCIUM 8.8*     Intake/Output Summary (Last 24 hours) at 07/22/2022 0801 Last data filed at 07/22/2022 0729 Gross per 24 hour  Intake 592 ml  Output 800 ml  Net -208 ml         Physical Exam: Vital Signs Blood pressure (!) 176/74, pulse 68, temperature 98 F (36.7 C), temperature source Oral, resp. rate 18, height _0  (1.803 m), weight 92 kg, SpO2 97 %.   General: No acute distress, seen at bedside Mood and affect are appropriate, pleasant, jovial  Heart: Regular rate and rhythm no rubs murmurs or extra sounds, no JVD Lungs: Clear to auscultation, breathing unlabored, no rales or wheezes, good air movement Abdomen: Positive bowel sounds, soft nontender to palpation, nondistended Extremities: No clubbing, cyanosis, or edema Skin: Warm dry no breakdown noted   Neuro:  fair insight and awareness. Normal language, sl dysarthria. Functional memory. RUE 5/5. LUE 3+/5 prox to distal. RLE 4+/5 prox to distal. LLE 2/5 prox to 4/5  distally. No LTsensory deficits but has reduced proprioception in the left hand . Decreased FMC of left hand and leg. No abnl tone. Mild dysmetria LUE  Musculoskeletal: Full ROM L knee, no effusion or erythema, left knee joint line tenderness,  pain with L hip int rotation, no pain with ext rot   Assessment/Plan: 1. Functional deficits due to probable  cardioembolic bilateral infarcts  Stable for D/C today F/u PCP in 3-4 weeks F/u PM&R 2 weeks See D/C summary See D/C instructions   Care Tool:  Bathing    Body parts bathed by patient: Right arm, Left arm, Chest, Abdomen, Front perineal area, Right upper leg, Left upper leg, Face, Right lower leg, Left lower leg, Buttocks   Body parts bathed by helper: Right lower leg, Left lower leg, Buttocks     Bathing assist Assist Level: Independent with assistive device     Upper Body Dressing/Undressing Upper body dressing   What is the patient wearing?: Pull over shirt    Upper body assist Assist Level: Independent with assistive device    Lower Body Dressing/Undressing Lower body dressing      What is the patient wearing?: Underwear/pull up, Pants     Lower body assist Assist  for lower body dressing: Independent with assitive device     Toileting Toileting    Toileting assist Assist for toileting: Independent with assistive device Assistive Device Comment: urinal   Transfers Chair/bed transfer  Transfers assist     Chair/bed transfer assist level: Independent with assistive device Chair/bed transfer assistive device: Museum/gallery exhibitions officer assist      Assist level: Supervision/Verbal cueing Assistive device: Walker-rolling Max distance: 290f   Walk 10 feet activity   Assist     Assist level: Independent with assistive device Assistive device: Walker-rolling   Walk 50 feet activity   Assist Walk 50 feet with 2 turns activity did not occur: Safety/medical concerns (Patient  unable to ambulate >15' and assess stair, curb, ramp mobility at this time secondary to reports of increased B knee pain, impaired endurance, decreased global strenght)  Assist level: Supervision/Verbal cueing Assistive device: Walker-rolling    Walk 150 feet activity   Assist Walk 150 feet activity did not occur: Safety/medical concerns  Assist level: Supervision/Verbal cueing Assistive device: Walker-rolling    Walk 10 feet on uneven surface  activity   Assist Walk 10 feet on uneven surfaces activity did not occur: Safety/medical concerns   Assist level: Supervision/Verbal cueing Assistive device: Walker-rolling   Wheelchair     Assist Is the patient using a wheelchair?: No Type of Wheelchair: Manual    Wheelchair assist level: Supervision/Verbal cueing Max wheelchair distance: 150'    Wheelchair 50 feet with 2 turns activity    Assist        Assist Level: Supervision/Verbal cueing   Wheelchair 150 feet activity     Assist      Assist Level: Supervision/Verbal cueing   Blood pressure (!) 176/74, pulse 68, temperature 98 F (36.7 C), temperature source Oral, resp. rate 18, height _0  (1.803 m), weight 92 kg, SpO2 97 %.  Medical Problem List and Plan: 1. 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              -30 day cardiac event monitoring recommended as outpt by neuro             -patient may shower             -ELOS/Goals: 07/22/2022, supervision/mod I  -Continue CIR therapies including PT, OT and SLP  -Discussed dc date planned for 07/22/22 with patient 2.  Antithrombotics: -DVT/anticoagulation:  Pharmaceutical: Lovenox start             -antiplatelet therapy: Plavix and aspirin daily for minimum 6 months 3. Pain Management: Tylenol as needed.  Has oxycodone as needed but not using             -See #13 below  Left knee pain with severe  OA medial compartment Hx R TKR, discussed elective surgery should wait 6 mo due to recent CVA   Hip pain no evidence of sig OA or fx on xray  4. Mood/Behavior/Sleep: LCSW to evaluate and team to provide emotional support             -antipsychotic agents: n/a             -Continue Cymbalta 60 mg every evening  -await neuropsych    5. Neuropsych/cognition: This patient is capable of making decisions on his own behalf. 6. Skin/Wound Care: Routine skin care checks 7. Fluids/Electrolytes/Nutrition: Strict I's and O's and follow-up chemistries             -  2 g sodium diet 8: CAD status post PCI 10/31 overlapping DES to LAD             -Plavix and aspirin for at least 6 months             -Continue lipid lowering therapy             -Follow-up with Dr. Virgina Jock  -Denies chest pain continue to monitor 9: PAD: Severe claudication status post bilateral lower extremity arteriogram             -Follow-up as outpatient with Dr. Virgina Jock 10: Heart failure with reduced ejection fraction:              -Weigh patient daily             -Continue Imdur 30 mg daily             -Continue Norvasc 10 mg daily             -Continue Toprol-XL 25 mg daily  -11/14, weights appears stable, continue to monitor Filed Weights   07/20/22 0545 07/21/22 0735 07/22/22 0621  Weight: 92.9 kg 89.1 kg 92 kg    11: OAB: continue Myrbetriq, Flomax 12: Hyperlipidemia: Continue Lipitor 80 mg nightly             -Continue Zetia 10 mg daily 13: Low back pain/radiculopathy/neuropathy:              -Methadone 5 mg every other day, Was on this as OP, ? Prescriber              -Zanaflex 4 mg at nighttime -Following with Dr. Laurance Flatten -will schedule oxycodone 30m 3x per day as he says this was how he was using at home.  14: Tobacco use: Cessation counseling 15: Diabetes mellitus: CBGs before every meal, at bedtime.               -Continue Jardiance 10 mg before breakfast             -Continue sliding scale with meals  CBG  (last 3)  Recent Labs    07/21/22 1633 07/21/22 2106 07/22/22 0553  GLUCAP 166* 116* 145*   Increase Jardiance to 263m -11/13 well controlled, continue current regimen 16: Gout: Continue colchicine 0.6 mg twice daily 17: Insomnia  -11/9 has been taking 50 mg as needed trazodone, I will schedule 75 mg dose  -improved continue trazodone [id number]. CKD III   -BUN 17/Cr 1.39 stable, continue to monitor LOS: 12 days A FACE TO FACE EVALUATION WAS PERFORMED  AndCharlett Blake/15/2023, 8:01 AM

## 2022-07-22 NOTE — TOC Transition Note (Signed)
Discharge meds sent to [facility] per patient and wife request. Animal nutritionist).

## 2022-07-22 NOTE — Progress Notes (Signed)
Patient ID: Thomas [name], male   DOB: 1946-08-01, 76 y.o.   MRN: [id number]  OP referral sent to [facility]  P: [phone] F: [phone]

## 2022-07-22 NOTE — Progress Notes (Signed)
Inpatient Rehabilitation Care Coordinator Discharge Note   Patient Details  Name: Thomas [name] MRN: [id number] Date of Birth: 1946-02-11   Discharge location: Home  Length of Stay: 12 Days  Discharge activity level: Supervision  Home/community participation: Spouse  Patient response ZU:AUEBVP Literacy - How often do you need to have someone help you when you read instructions, pamphlets, or other written material from your doctor or pharmacy?: Sometimes  Patient response LW:UZRVUF Isolation - How often do you feel lonely or isolated from those around you?: Never  Services provided included: RD, MD, PT, OT, SLP, RN, CM, TR, Pharmacy, Neuropsych, SW  Financial Services:  Financial Services Utilized: Medicare    Choices offered to/list presented to: patient and spouse  Follow-up services arranged:  Outpatient    Outpatient Servicies: [facility]      Patient response to transportation need: Is the patient able to respond to transportation needs?: Yes In the past 12 months, has lack of transportation kept you from medical appointments or from getting medications?: No In the past 12 months, has lack of transportation kept you from meetings, work, or from getting things needed for daily living?: No    Comments (or additional information):  Patient/Family verbalized understanding of follow-up arrangements:  Yes  Individual responsible for coordination of the follow-up plan: self or  Thomas [name]  Confirmed correct DME delivered: [name] 07/22/2022    [name]

## 2022-07-22 NOTE — Progress Notes (Addendum)
PA Katharine Look in with pt/wife to discuss discharge instructions. Pt/family in agreement. Meds are going to be picked up from [facility]. Pt left per wheelchair to private vehicle.No complications noted. Sheela Stack, LPN

## 2022-07-23 ENCOUNTER — Telehealth: Payer: Self-pay

## 2022-07-23 ENCOUNTER — Telehealth: Payer: Self-pay | Admitting: Registered Nurse

## 2022-07-23 NOTE — Telephone Encounter (Signed)
Transition Care Management Follow-up Telephone Call Date of discharge and from where: [facility] 07/22/2022 How have you been since you were released from the hospital? better Any questions or concerns? No  Items Reviewed: Did the pt receive and understand the discharge instructions provided? Yes  Medications obtained and verified? Yes  Other? No  Any new allergies since your discharge? No  Dietary orders reviewed? Yes Do you have support at home? Yes   Home Care and Equipment/Supplies: Were home health services ordered? no If so, what is the name of the agency? N/a  Has the agency set up a time to come to the patient's home? not applicable Were any new equipment or medical supplies ordered?  No What is the name of the medical supply agency? N/a Were you able to get the supplies/equipment? not applicable Do you have any questions related to the use of the equipment or supplies? No  Functional Questionnaire: (I = Independent and D = Dependent) ADLs: I  Bathing/Dressing- I  Meal Prep- I  Eating- I  Maintaining continence- I  Transferring/Ambulation- I  Managing Meds- I  Follow up appointments reviewed:  PCP Hospital f/u appt confirmed? Yes  Scheduled to see Dr Gena Fray on 07/29/2022 @ 10:00. [facility] f/u appt confirmed? No  Patient to schedule Are transportation arrangements needed? No  If their condition worsens, is the pt aware to call PCP or go to the Emergency Dept.? Yes Was the patient provided with contact information for the PCP's office or ED? Yes Was to pt encouraged to call back with questions or concerns? Yes Juanda Crumble, LPN [place] Direct Dial [phone]

## 2022-07-23 NOTE — Telephone Encounter (Signed)
Please call patients wife. Patient is needing medication refills ( she did not state medications)

## 2022-07-23 NOTE — Progress Notes (Signed)
Inpatient Rehabilitation Discharge Medication Review by a Pharmacist  A complete drug regimen review was completed for this patient to identify any potential clinically significant medication issues.  High Risk Drug Classes Is patient taking? Indication by Medication  Antipsychotic No   Anticoagulant No   Antibiotic No   Opioid Yes Oxycodone for acute pain  Methadone for chronic pain  Antiplatelet Yes Aspirin and clopidogrel for CVA   Hypoglycemics/insulin No   Vasoactive Medication Yes Amlodipine for HTN Metoprolol for HFrEF   Chemotherapy No   Other Yes Atorvastatin and ezetimibe for HLD Colchicine for pericarditis Duloxetine for depression Empagliflozin for HFrEF and DMT2 Furosemide for HFrEF  Imdur and nitroglycerin for UA Tamsulosin for BPH  Tizanidine for muscle spasms Varenicline for tobacco cessation Mirabegron for OAB     Type of Medication Issue Identified Description of Issue Recommendation(s)  Drug Interaction(s) (clinically significant)     Duplicate Therapy     Allergy     No Medication Administration End Date     Incorrect Dose     Additional Drug Therapy Needed     Significant med changes from prior encounter (inform family/care partners about these prior to discharge). STOP entresto, glipizide, and metformin  NEW clopidogrel, ezetimibe, furosemide, nitroglycerin  INCREASE atorvastatin  Team reviewed with patient   Other       Clinically significant medication issues were identified that warrant physician communication and completion of prescribed/recommended actions by midnight of the next day:  No  Name of provider notified for urgent issues identified:   Provider Method of Notification:     Pharmacist comments:   Time spent performing this drug regimen review (minutes):  [place], PharmD, BCPS, [facility] Clinical Pharmacist  Please check AMION for all [place] phone numbers After 10:00 PM, call Frannie

## 2022-07-24 NOTE — Telephone Encounter (Unsigned)
Attempted to reach for TC call and answer questions but no answer on mobile or home number.

## 2022-07-25 ENCOUNTER — Inpatient Hospital Stay (HOSPITAL_COMMUNITY)
Admission: EM | Admit: 2022-07-25 | Discharge: 2022-07-31 | DRG: 092 | Disposition: A | Discharge status: Home / Self Care | Payer: Medicare Other | Attending: Internal Medicine | Admitting: Internal Medicine

## 2022-07-25 ENCOUNTER — Emergency Department (HOSPITAL_COMMUNITY): Payer: Medicare Other

## 2022-07-25 ENCOUNTER — Other Ambulatory Visit: Payer: Self-pay

## 2022-07-25 DIAGNOSIS — Z8719 Personal history of other diseases of the digestive system: Secondary | ICD-10-CM

## 2022-07-25 DIAGNOSIS — Z79899 Other long term (current) drug therapy: Secondary | ICD-10-CM

## 2022-07-25 DIAGNOSIS — G4730 Sleep apnea, unspecified: Secondary | ICD-10-CM | POA: Diagnosis present

## 2022-07-25 DIAGNOSIS — G9389 Other specified disorders of brain: Secondary | ICD-10-CM | POA: Diagnosis not present

## 2022-07-25 DIAGNOSIS — Z1152 Encounter for screening for COVID-19: Secondary | ICD-10-CM | POA: Diagnosis not present

## 2022-07-25 DIAGNOSIS — E1122 Type 2 diabetes mellitus with diabetic chronic kidney disease: Secondary | ICD-10-CM | POA: Diagnosis present

## 2022-07-25 DIAGNOSIS — R4701 Aphasia: Secondary | ICD-10-CM | POA: Diagnosis present

## 2022-07-25 DIAGNOSIS — N179 Acute kidney failure, unspecified: Secondary | ICD-10-CM

## 2022-07-25 DIAGNOSIS — Z7984 Long term (current) use of oral hypoglycemic drugs: Secondary | ICD-10-CM

## 2022-07-25 DIAGNOSIS — I69352 Hemiplegia and hemiparesis following cerebral infarction affecting left dominant side: Secondary | ICD-10-CM | POA: Diagnosis not present

## 2022-07-25 DIAGNOSIS — I10 Essential (primary) hypertension: Secondary | ICD-10-CM | POA: Diagnosis not present

## 2022-07-25 DIAGNOSIS — R2990 Unspecified symptoms and signs involving the nervous system: Secondary | ICD-10-CM | POA: Diagnosis not present

## 2022-07-25 DIAGNOSIS — I5032 Chronic diastolic (congestive) heart failure: Secondary | ICD-10-CM | POA: Diagnosis not present

## 2022-07-25 DIAGNOSIS — F0393 Unspecified dementia, unspecified severity, with mood disturbance: Secondary | ICD-10-CM | POA: Diagnosis present

## 2022-07-25 DIAGNOSIS — Z8249 Family history of ischemic heart disease and other diseases of the circulatory system: Secondary | ICD-10-CM

## 2022-07-25 DIAGNOSIS — Z820 Family history of epilepsy and other diseases of the nervous system: Secondary | ICD-10-CM | POA: Diagnosis not present

## 2022-07-25 DIAGNOSIS — I130 Hypertensive heart and chronic kidney disease with heart failure and stage 1 through stage 4 chronic kidney disease, or unspecified chronic kidney disease: Secondary | ICD-10-CM | POA: Diagnosis not present

## 2022-07-25 DIAGNOSIS — I110 Hypertensive heart disease with heart failure: Secondary | ICD-10-CM | POA: Diagnosis present

## 2022-07-25 DIAGNOSIS — G9341 Metabolic encephalopathy: Secondary | ICD-10-CM | POA: Diagnosis not present

## 2022-07-25 DIAGNOSIS — R410 Disorientation, unspecified: Secondary | ICD-10-CM | POA: Diagnosis not present

## 2022-07-25 DIAGNOSIS — I639 Cerebral infarction, unspecified: Secondary | ICD-10-CM | POA: Diagnosis not present

## 2022-07-25 DIAGNOSIS — R4781 Slurred speech: Secondary | ICD-10-CM | POA: Diagnosis present

## 2022-07-25 DIAGNOSIS — R45851 Suicidal ideations: Secondary | ICD-10-CM | POA: Diagnosis not present

## 2022-07-25 DIAGNOSIS — F0392 Unspecified dementia, unspecified severity, with psychotic disturbance: Secondary | ICD-10-CM | POA: Diagnosis present

## 2022-07-25 DIAGNOSIS — F17210 Nicotine dependence, cigarettes, uncomplicated: Secondary | ICD-10-CM | POA: Diagnosis present

## 2022-07-25 DIAGNOSIS — G928 Other toxic encephalopathy: Secondary | ICD-10-CM | POA: Diagnosis not present

## 2022-07-25 DIAGNOSIS — E785 Hyperlipidemia, unspecified: Secondary | ICD-10-CM | POA: Diagnosis present

## 2022-07-25 DIAGNOSIS — Z955 Presence of coronary angioplasty implant and graft: Secondary | ICD-10-CM

## 2022-07-25 DIAGNOSIS — R451 Restlessness and agitation: Secondary | ICD-10-CM | POA: Diagnosis not present

## 2022-07-25 DIAGNOSIS — N1831 Chronic kidney disease, stage 3a: Secondary | ICD-10-CM | POA: Diagnosis not present

## 2022-07-25 DIAGNOSIS — R29818 Other symptoms and signs involving the nervous system: Secondary | ICD-10-CM | POA: Diagnosis not present

## 2022-07-25 DIAGNOSIS — Z7982 Long term (current) use of aspirin: Secondary | ICD-10-CM | POA: Diagnosis not present

## 2022-07-25 DIAGNOSIS — I6623 Occlusion and stenosis of bilateral posterior cerebral arteries: Secondary | ICD-10-CM | POA: Diagnosis not present

## 2022-07-25 DIAGNOSIS — F332 Major depressive disorder, recurrent severe without psychotic features: Secondary | ICD-10-CM | POA: Diagnosis present

## 2022-07-25 DIAGNOSIS — H9190 Unspecified hearing loss, unspecified ear: Secondary | ICD-10-CM | POA: Diagnosis present

## 2022-07-25 DIAGNOSIS — Z66 Do not resuscitate: Secondary | ICD-10-CM | POA: Diagnosis not present

## 2022-07-25 DIAGNOSIS — R262 Difficulty in walking, not elsewhere classified: Secondary | ICD-10-CM | POA: Diagnosis not present

## 2022-07-25 DIAGNOSIS — R531 Weakness: Secondary | ICD-10-CM | POA: Diagnosis not present

## 2022-07-25 DIAGNOSIS — R4182 Altered mental status, unspecified: Secondary | ICD-10-CM | POA: Diagnosis not present

## 2022-07-25 DIAGNOSIS — E1142 Type 2 diabetes mellitus with diabetic polyneuropathy: Secondary | ICD-10-CM | POA: Diagnosis present

## 2022-07-25 DIAGNOSIS — M549 Dorsalgia, unspecified: Secondary | ICD-10-CM | POA: Diagnosis present

## 2022-07-25 DIAGNOSIS — T50915A Adverse effect of multiple unspecified drugs, medicaments and biological substances, initial encounter: Secondary | ICD-10-CM | POA: Diagnosis present

## 2022-07-25 DIAGNOSIS — N189 Chronic kidney disease, unspecified: Secondary | ICD-10-CM

## 2022-07-25 DIAGNOSIS — Z7902 Long term (current) use of antithrombotics/antiplatelets: Secondary | ICD-10-CM

## 2022-07-25 DIAGNOSIS — R29704 NIHSS score 4: Secondary | ICD-10-CM | POA: Diagnosis present

## 2022-07-25 DIAGNOSIS — I2510 Atherosclerotic heart disease of native coronary artery without angina pectoris: Secondary | ICD-10-CM | POA: Diagnosis present

## 2022-07-25 DIAGNOSIS — G319 Degenerative disease of nervous system, unspecified: Secondary | ICD-10-CM | POA: Diagnosis not present

## 2022-07-25 DIAGNOSIS — R4189 Other symptoms and signs involving cognitive functions and awareness: Secondary | ICD-10-CM | POA: Diagnosis present

## 2022-07-25 DIAGNOSIS — R202 Paresthesia of skin: Secondary | ICD-10-CM | POA: Diagnosis present

## 2022-07-25 DIAGNOSIS — N183 Chronic kidney disease, stage 3 unspecified: Secondary | ICD-10-CM | POA: Diagnosis present

## 2022-07-25 DIAGNOSIS — Z91030 Bee allergy status: Secondary | ICD-10-CM

## 2022-07-25 DIAGNOSIS — F17200 Nicotine dependence, unspecified, uncomplicated: Secondary | ICD-10-CM | POA: Diagnosis present

## 2022-07-25 DIAGNOSIS — Z96651 Presence of right artificial knee joint: Secondary | ICD-10-CM | POA: Diagnosis present

## 2022-07-25 DIAGNOSIS — G8929 Other chronic pain: Secondary | ICD-10-CM | POA: Diagnosis present

## 2022-07-25 DIAGNOSIS — I959 Hypotension, unspecified: Secondary | ICD-10-CM | POA: Diagnosis not present

## 2022-07-25 LAB — CBC
HCT: 39.1 % (ref 39.0–52.0)
Hemoglobin: 13.7 g/dL (ref 13.0–17.0)
MCH: 31.7 pg (ref 26.0–34.0)
MCHC: 35 g/dL (ref 30.0–36.0)
MCV: 90.5 fL (ref 80.0–100.0)
Platelets: 216 10*3/uL (ref 150–400)
RBC: 4.32 MIL/uL (ref 4.22–5.81)
RDW: 12.4 % (ref 11.5–15.5)
WBC: 7.7 10*3/uL (ref 4.0–10.5)
nRBC: 0 % (ref 0.0–0.2)

## 2022-07-25 LAB — PROTIME-INR
INR: 1.1 (ref 0.8–1.2)
Prothrombin Time: 13.8 seconds (ref 11.4–15.2)

## 2022-07-25 LAB — COMPREHENSIVE METABOLIC PANEL
ALT: 44 U/L (ref 0–44)
AST: 25 U/L (ref 15–41)
Albumin: 3.7 g/dL (ref 3.5–5.0)
Alkaline Phosphatase: 115 U/L (ref 38–126)
Anion gap: 13 (ref 5–15)
BUN: 16 mg/dL (ref 8–23)
CO2: 22 mmol/L (ref 22–32)
Calcium: 9.4 mg/dL (ref 8.9–10.3)
Chloride: 106 mmol/L (ref 98–111)
Creatinine, Ser: 1.6 mg/dL — ABNORMAL HIGH (ref 0.61–1.24)
GFR, Estimated: 44 mL/min — ABNORMAL LOW (ref >60)
Glucose, Bld: 181 mg/dL — ABNORMAL HIGH (ref 70–99)
Potassium: 3.6 mmol/L (ref 3.5–5.1)
Sodium: 141 mmol/L (ref 135–145)
Total Bilirubin: 0.7 mg/dL (ref 0.3–1.2)
Total Protein: 6.1 g/dL — ABNORMAL LOW (ref 6.5–8.1)

## 2022-07-25 LAB — DIFFERENTIAL
Abs Immature Granulocytes: 0.03 10*3/uL (ref 0.00–0.07)
Basophils Absolute: 0.1 10*3/uL (ref 0.0–0.1)
Basophils Relative: 1 %
Eosinophils Absolute: 0.2 10*3/uL (ref 0.0–0.5)
Eosinophils Relative: 3 %
Immature Granulocytes: 0 %
Lymphocytes Relative: 22 %
Lymphs Abs: 1.7 10*3/uL (ref 0.7–4.0)
Monocytes Absolute: 0.7 10*3/uL (ref 0.1–1.0)
Monocytes Relative: 9 %
Neutro Abs: 5 10*3/uL (ref 1.7–7.7)
Neutrophils Relative %: 65 %

## 2022-07-25 LAB — I-STAT CHEM 8, ED
BUN: 18 mg/dL (ref 8–23)
Calcium, Ion: 1.1 mmol/L — ABNORMAL LOW (ref 1.15–1.40)
Chloride: 105 mmol/L (ref 98–111)
Creatinine, Ser: 1.6 mg/dL — ABNORMAL HIGH (ref 0.61–1.24)
Glucose, Bld: 178 mg/dL — ABNORMAL HIGH (ref 70–99)
HCT: 37 % — ABNORMAL LOW (ref 39.0–52.0)
Hemoglobin: 12.6 g/dL — ABNORMAL LOW (ref 13.0–17.0)
Potassium: 3.6 mmol/L (ref 3.5–5.1)
Sodium: 139 mmol/L (ref 135–145)
TCO2: 23 mmol/L (ref 22–32)

## 2022-07-25 LAB — CBG MONITORING, ED: Glucose-Capillary: 165 mg/dL — ABNORMAL HIGH (ref 70–99)

## 2022-07-25 LAB — ETHANOL: Alcohol, Ethyl (B): <10 mg/dL (ref <10)

## 2022-07-25 LAB — APTT: aPTT: 30 seconds (ref 24–36)

## 2022-07-25 MED ORDER — IOHEXOL 350 MG/ML SOLN
100.0000 mL | Freq: Once | INTRAVENOUS | Status: AC | PRN
  Administered 2022-07-25: 100 mL via INTRAVENOUS

## 2022-07-25 MED ORDER — AMLODIPINE BESYLATE 10 MG PO TABS
10.0000 mg | ORAL_TABLET | Freq: Every day | ORAL | Status: DC
  Administered 2022-07-26 – 2022-07-31 (×6): 10 mg via ORAL
  Filled 2022-07-25 (×6): qty 1

## 2022-07-25 MED ORDER — TAMSULOSIN HCL 0.4 MG PO CAPS
0.4000 mg | ORAL_CAPSULE | Freq: Every day | ORAL | Status: DC
  Administered 2022-07-26 – 2022-07-30 (×5): 0.4 mg via ORAL
  Filled 2022-07-25 (×6): qty 1

## 2022-07-25 MED ORDER — EMPAGLIFLOZIN 25 MG PO TABS
25.0000 mg | ORAL_TABLET | Freq: Every day | ORAL | Status: DC
  Administered 2022-07-26 – 2022-07-31 (×6): 25 mg via ORAL
  Filled 2022-07-25 (×6): qty 1

## 2022-07-25 MED ORDER — STROKE: EARLY STAGES OF RECOVERY BOOK
Freq: Once | Status: AC
  Filled 2022-07-25: qty 1

## 2022-07-25 MED ORDER — SENNOSIDES-DOCUSATE SODIUM 8.6-50 MG PO TABS: 1.0000 | ORAL_TABLET | Freq: Every evening | ORAL | Status: DC | PRN

## 2022-07-25 MED ORDER — ACETAMINOPHEN 160 MG/5ML PO SOLN: 650.0000 mg | ORAL | Status: DC | PRN

## 2022-07-25 MED ORDER — CLOPIDOGREL BISULFATE 75 MG PO TABS
75.0000 mg | ORAL_TABLET | Freq: Every day | ORAL | Status: DC
  Administered 2022-07-26 – 2022-07-31 (×6): 75 mg via ORAL
  Filled 2022-07-25 (×6): qty 1

## 2022-07-25 MED ORDER — ASPIRIN 81 MG PO CHEW
81.0000 mg | CHEWABLE_TABLET | Freq: Every day | ORAL | Status: DC
  Administered 2022-07-26 – 2022-07-31 (×6): 81 mg via ORAL
  Filled 2022-07-25 (×6): qty 1

## 2022-07-25 MED ORDER — ACETAMINOPHEN 650 MG RE SUPP: 650.0000 mg | RECTAL | Status: DC | PRN

## 2022-07-25 MED ORDER — ATORVASTATIN CALCIUM 80 MG PO TABS
80.0000 mg | ORAL_TABLET | Freq: Every day | ORAL | Status: DC
  Administered 2022-07-26 – 2022-07-29 (×4): 80 mg via ORAL
  Filled 2022-07-25: qty 2
  Filled 2022-07-25 (×5): qty 1

## 2022-07-25 MED ORDER — SODIUM CHLORIDE 0.9% FLUSH
3.0000 mL | Freq: Once | INTRAVENOUS | Status: AC
  Administered 2022-07-26: 3 mL via INTRAVENOUS

## 2022-07-25 MED ORDER — EZETIMIBE 10 MG PO TABS
10.0000 mg | ORAL_TABLET | Freq: Every day | ORAL | Status: DC
  Administered 2022-07-26 – 2022-07-31 (×6): 10 mg via ORAL
  Filled 2022-07-25 (×6): qty 1

## 2022-07-25 MED ORDER — ISOSORBIDE MONONITRATE ER 30 MG PO TB24
30.0000 mg | ORAL_TABLET | Freq: Every day | ORAL | Status: DC
  Administered 2022-07-26 – 2022-07-31 (×6): 30 mg via ORAL
  Filled 2022-07-25 (×6): qty 1

## 2022-07-25 MED ORDER — ENOXAPARIN SODIUM 40 MG/0.4ML IJ SOSY
40.0000 mg | PREFILLED_SYRINGE | INTRAMUSCULAR | Status: DC
  Administered 2022-07-26 – 2022-07-31 (×6): 40 mg via SUBCUTANEOUS
  Filled 2022-07-25 (×6): qty 0.4

## 2022-07-25 MED ORDER — METOPROLOL SUCCINATE ER 25 MG PO TB24
25.0000 mg | ORAL_TABLET | Freq: Every day | ORAL | Status: DC
  Administered 2022-07-26 – 2022-07-31 (×6): 25 mg via ORAL
  Filled 2022-07-25 (×6): qty 1

## 2022-07-25 MED ORDER — DULOXETINE HCL 60 MG PO CPEP
60.0000 mg | ORAL_CAPSULE | Freq: Every evening | ORAL | Status: DC
  Administered 2022-07-26 – 2022-07-30 (×6): 60 mg via ORAL
  Filled 2022-07-25: qty 1
  Filled 2022-07-25: qty 2
  Filled 2022-07-25 (×5): qty 1

## 2022-07-25 MED ORDER — ACETAMINOPHEN 325 MG PO TABS
650.0000 mg | ORAL_TABLET | ORAL | Status: DC | PRN
  Administered 2022-07-28: 650 mg via ORAL
  Filled 2022-07-25 (×2): qty 2

## 2022-07-25 MED ORDER — COLCHICINE 0.6 MG PO TABS
0.6000 mg | ORAL_TABLET | Freq: Two times a day (BID) | ORAL | Status: DC
  Administered 2022-07-26 – 2022-07-31 (×10): 0.6 mg via ORAL
  Filled 2022-07-25 (×13): qty 1

## 2022-07-25 NOTE — ED Triage Notes (Signed)
Pt bib [facility] due to new weakness started at 1300, pt had a stroke about a week ago pt shows more weakness on left side

## 2022-07-25 NOTE — ED Notes (Signed)
Patient transported to MRI 

## 2022-07-25 NOTE — Consult Note (Addendum)
Neurology Consultation  Reason for Consult: stroke code for confusion Referring Physician: Dr. Langston Masker  CC: confusion, speech issues, generalized weakness  History is obtained from: patient and chart  HPI: Thomas [name] is a 76 y.o. 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 Patient was presumably last known well at 1 PM.  He had been home a few days after rehab.  His wife and him were watching a movie and later on she noticed that he is having trouble with his speech.  When EMTs got there, he was unable to follow commands and his verbal output was also minimal.  They also thought that he was neglecting some on the left side.  By the time he arrived in the hospital which was about at least 25 to 30-minute transit time, he was much better, able to tell me his name, struggled to tell me the age but knew it was the month of November and Thanksgiving holiday was coming up.  He was able to follow all commands.  He was somewhat weaker on the left upper extremity.  Speech is mildly dysarthric and on further evaluation he had mild aphasia with longer sentences. Given the last known well outside the window-not a candidate for IV thrombolysis. CT angio and perfusion was performed given the fact that he had exhibited symptoms of aphasia which could be present in large vessel occlusion stroke.  Although not a  candidate for IV thrombolysis, consideration for EVT could be given if he has LVO.  CT angio and perfusion studies were negative.  LKW: 1300 hrs. IV thrombolysis given?: no, outside the window Premorbid modified Rankin scale (mRS): 2-3  ROS: Full ROS was performed and is negative except as noted in the HPI.  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 He has a 27.50 pack-year smoking history. He has been exposed to tobacco smoke. He has never used smokeless tobacco. He reports that he does not drink alcohol and does not use drugs.  Medications  Current Facility-Administered Medications:    sodium chloride flush (NS)  0.9 % injection 3 mL, 3 mL, Intravenous, Once, Trifan, Carola Rhine, MD  Current Outpatient Medications:    acetaminophen (TYLENOL) 325 MG tablet, Take 1-2 tablets (325-650 mg total) by mouth every 4 (four) hours as needed for mild pain., Disp: , Rfl:    amLODipine (NORVASC) 10 MG tablet, Take 10 mg by mouth in the morning. Heart Medication, Disp: , Rfl:    aspirin 81 MG chewable tablet, Chew 81 mg by mouth daily at 12 noon. Heart  medication, Disp: , Rfl:    atorvastatin (LIPITOR) 80 MG tablet, Take 1 tablet (80 mg total) by mouth at bedtime., Disp: 30 tablet, Rfl: 0   Calcium Carbonate-Vitamin D 600-400 MG-UNIT tablet, Take 1 tablet by mouth 2 (two) times daily., Disp: , Rfl:    cetirizine (ZYRTEC) 10 MG tablet, Take 10 mg by mouth daily as needed for allergies., Disp: , Rfl:    clopidogrel (PLAVIX) 75 MG tablet, Take 1 tablet (75 mg total) by mouth daily with breakfast., Disp: 30 tablet, Rfl: 0   colchicine 0.6 MG tablet, Take 1 tablet (0.6 mg total) by mouth 2 (two) times daily., Disp: 60 tablet, Rfl: 2   diclofenac Sodium (VOLTAREN) 1 % GEL, Apply 1 application topically 4 (four) times daily as needed (pain)., Disp: , Rfl:    DULoxetine (CYMBALTA) 60 MG capsule, Take 1 capsule (60 mg total) by mouth every evening., Disp: 30 capsule, Rfl: 0   empagliflozin (JARDIANCE) 25 MG TABS tablet, Take 1 tablet (25 mg total) by mouth daily before breakfast., Disp: 30 tablet, Rfl: 0   EPINEPHrine 0.3 mg/0.3 mL IJ SOAJ injection, Inject 0.3 mg into the muscle daily as needed (for anaphylaxis). , Disp: , Rfl:    ezetimibe (ZETIA) 10 MG tablet, Take 1 tablet (10 mg total) by mouth daily., Disp: 30 tablet, Rfl: 0   furosemide (LASIX) 40 MG tablet, Take 1 tablet (40 mg total) by mouth daily. (Patient not taking: Reported on 07/23/2022), Disp: 90 tablet, Rfl: 3   isosorbide mononitrate (IMDUR) 30 MG 24 hr tablet, Take 1 tablet (30 mg total) by mouth daily., Disp: 30 tablet, Rfl: 2   methadone (DOLOPHINE) 5 MG tablet, Take 5 mg by mouth every other day. For severe chronic pain, Disp: , Rfl:    metoprolol succinate (TOPROL XL) 25 MG 24 hr tablet, Take 1 tablet (25 mg total) by mouth daily., Disp: 30 tablet, Rfl: 3   nitroGLYCERIN (NITROSTAT) 0.4 MG SL tablet, Place 1 tablet (0.4 mg total) under the tongue every 5 (five) minutes as needed for chest pain., Disp: 6 tablet, Rfl: 0   oxyCODONE (OXY IR/ROXICODONE) 5 MG immediate release tablet, Take  5 mg by mouth 4 (four) times daily as needed (severe chronic pain). , Disp: , Rfl:    tamsulosin (FLOMAX) 0.4 MG CAPS capsule, Take 1 capsule (0.4 mg total) by mouth at bedtime. (Patient not taking: Reported on 07/23/2022), Disp: 30 capsule, Rfl: 0   tiZANidine (ZANAFLEX) 4 MG tablet, Take 1 tablet (4 mg total) by mouth at bedtime., Disp: 30 tablet, Rfl: 0   Varenicline Tartrate, Starter, (CHANTIX STARTING MONTH PAK) 0.5 MG X 11 & 1 MG X 42 TBPK, Take 1 tablet by mouth daily. (Patient not taking: Reported on 07/23/2022), Disp: 30 each, Rfl: 3   Vibegron 75 MG TABS, Take 75 mg by mouth daily., Disp: 30 tablet, Rfl: 0   Exam: Current vital signs: BP (!) 163/83 (BP Location: Right Arm)   Pulse 85   Temp 98.1  F (36.7 C) (Oral)   Resp 17   Ht _0  (1.803 m)   Wt 92 kg   SpO2 93%   BMI 28.29 kg/m  Vital signs in last 24 hours: Temp:  [98.1 F (36.7 C)] 98.1 F (36.7 C) (11/18 1907) Pulse Rate:  [85] 85 (11/18 1907) Resp:  [17] 17 (11/18 1907) BP: (163)/(83) 163/83 (11/18 1907) SpO2:  [93 %] 93 % (11/18 1907) Weight:  [92 kg] 92 kg (11/18 1908) General: Awake alert in no distress HEENT: Normocephalic atraumatic Lungs: Clear to auscultation Cardiovascular: Regular rhythm Abdomen nondistended nontender Extremities warm well perfused  Neurological exam Awake alert oriented to self. He was not tell me his correct age but he was able to tell me the month is November and the next holidays Thanksgiving. Able to follow commands Naming is intact, comprehension is intact yet slow, talking in long sentences he does have trouble with word finding. Cranial nerves II to XII appear intact Motor examination with mild drift in the left upper extremity.  The other 3 extremities appear stronger and antigravity without drift. Sensory exam is intact to light touch without extinction Coordination with no gross dysmetria disproportionate to weakness. NIHSS 1a Level of Conscious.: 0 1b LOC  Questions: 1 1c LOC Commands: 0 2 Best Gaze: 0 3 Visual: 0 4 Facial Palsy: 0 5a Motor Arm - left: 1 5b Motor Arm - Right: 0 6a Motor Leg - Left: 0 6b Motor Leg - Right: 0 7 Limb Ataxia: 0 8 Sensory: 0 9 Best Language: 1 10 Dysarthria: 1 11 Extinct. and Inatten.: 0 TOTAL: 4   Labs I have reviewed labs in epic and the results pertinent to this consultation are:  CBC    Component Value Date/Time   WBC 7.7 07/25/2022 1826   RBC 4.32 07/25/2022 1826   HGB 12.6 (L) 07/25/2022 1833   HCT 37.0 (L) 07/25/2022 1833   PLT 216 07/25/2022 1826   MCV 90.5 07/25/2022 1826   MCH 31.7 07/25/2022 1826   MCHC 35.0 07/25/2022 1826   RDW 12.4 07/25/2022 1826   LYMPHSABS 1.7 07/25/2022 1826   MONOABS 0.7 07/25/2022 1826   EOSABS 0.2 07/25/2022 1826   BASOSABS 0.1 07/25/2022 1826    CMP     Component Value Date/Time   NA 139 07/25/2022 1833   NA 138 03/09/2022 1103   K 3.6 07/25/2022 1833   CL 105 07/25/2022 1833   CO2 23 07/20/2022 0508   GLUCOSE 178 (H) 07/25/2022 1833   BUN 18 07/25/2022 1833   BUN 25 03/09/2022 1103   CREATININE 1.60 (H) 07/25/2022 1833   CALCIUM 8.8 (L) 07/20/2022 0508   PROT 7.6 04/28/2022 1041   ALBUMIN 4.4 04/28/2022 1041   AST 19 04/28/2022 1041   ALT 17 04/28/2022 1041   ALKPHOS 88 04/28/2022 1041   BILITOT 0.8 04/28/2022 1041   GFRNONAA 52 (L) 07/20/2022 0508   GFRAA >60 12/13/2019 0405    Lipid Panel     Component Value Date/Time   CHOL 129 07/09/2022 0230   TRIG 89 07/09/2022 0230   HDL 28 (L) 07/09/2022 0230   CHOLHDL 4.6 07/09/2022 0230   VLDL 18 07/09/2022 0230   LDLCALC 83 07/09/2022 0230     Imaging I have reviewed the images obtained:  CT-head-aspects 10, no bleed CT angiography head and neck-no emergent large vessel occlusion.  No perfusion abnormality on CT perfusion study.  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  Moderate stenosis of  the proximal cavernous left ICA segment.  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  Mild to moderate narrowing of the right subclavian proximal to the takeoff of the subclavian artery  Assessment: 75 year old with recent PCI and recent stroke with embolic looking etiology being evaluated with outpatient cardiac monitoring presenting for sudden onset of speech difficulty and confusion which seems to be resolving and he is getting closer towards his baseline after the prior stroke earlier this month. Differentials at this point-new stroke versus expansion of old strokes versus seizures. Due to the fact that he was very symptomatic at home and has been since about 1 PM and is now coming around, I would recommend that we observe him and my detailed recommendation are below.  Impression: New Eliquis is expansion of the old stroke versus seizure  Recommendations: MRI of the brain without contrast stat Routine EEG Maintain seizure precautions Dual antiplatelets as prescribed during the last hospitalization Lipitor 80 No need to repeat the full stroke work-up at this time PT OT ST Neurology will follow with you. Plan was discussed with Dr. Langston Masker, EDP   -- Amie Portland, MD Neurologist [facility] Pager: [phone]

## 2022-07-25 NOTE — H&P (Signed)
+PCP:   Haydee Salter, MD   Chief Complaint:  Acute metabolic encephalopathy  HPI: This is a 76 year old male with past medical history CHF, dyslipidemia, hypertension, chronic severe lower back pain.  Patient was recently admitted 03/27/2022 to 07/10/2022, then rehab 07/10/2022 to 07/22/2022.  During that hospitalization had a atherectomy and PCI to prox-mid LAD.  He also suffered a stroke with left-sided weakness.  He was discharged to rehab.  He has been home for 3 days doing well.  He was okay this morning when he awoke, by noon he was somewhat agitated.  He was focused on his wife going out to buy bread and eggs despite the fact that there was some the house.  Around 2 PM she went to the store, when she came back the patient appeared okay, but when his granddaughter called it became apparent that the patient was altered and confused.  He did not know what the telephone was, he did not know who his granddaughter Juliann Pulse was.  His best friend called and he did not recognize ringing of his own phone or who his best friend was.  EMS was called, he was brought to the ER.   Patient started a new medication Wellbutrin today  In the ER patient remains somewhat glazed and disoriented.  He responds appropriately to yes/no questions and moves extremities when asked.  He has a delay when following commands.  He identifies his wife by nonverbal cues.  He speaks very little and is often glazed.  Goes back to sleep easily. MRI head done shows stroke in evolution.  History provided by patient's wife was present at bedside.  Review of Systems:  The patient denies anorexia, fever, weight loss,, vision loss, decreased hearing, hoarseness, chest pain, syncope, dyspnea on exertion, peripheral edema, balance deficits, hemoptysis, abdominal pain, melena, hematochezia, severe indigestion/heartburn, hematuria, incontinence, genital sores, muscle weakness, suspicious skin lesions, transient blindness, difficulty walking,  depression, unusual weight change, abnormal bleeding, enlarged lymph nodes, angioedema, and breast masses. 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   Obesity 05/12/2017   Osteoarthritis of knee 05/12/2017   Paresthesia 05/12/2017   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   Prostate nodule 05/12/2017   Right rotator cuff tear 10/01/2021   Seborrheic dermatitis 05/12/2017   Past Surgical History:  Procedure Laterality Date   ABDOMINAL AORTOGRAM W/LOWER EXTREMITY N/A 07/07/2022   Procedure: ABDOMINAL AORTOGRAM W/LOWER EXTREMITY;  Surgeon: Nigel Mormon, MD;  Location: [facility];  Service: Cardiovascular;  Laterality: N/A;   CORONARY ATHERECTOMY N/A 07/07/2022   Procedure: CORONARY ATHERECTOMY;  Surgeon: Nigel Mormon, MD;  Location: [facility];  Service: Cardiovascular;  Laterality: N/A;   CORONARY STENT INTERVENTION N/A 07/07/2022   Procedure: CORONARY STENT INTERVENTION;  Surgeon: Nigel Mormon, MD;  Location: [facility];  Service: Cardiovascular;  Laterality: N/A;   HAND SURGERY Right    INTRAVASCULAR PRESSURE WIRE/FFR STUDY N/A 07/07/2022   Procedure: INTRAVASCULAR PRESSURE WIRE/FFR STUDY;  Surgeon: Nigel Mormon, MD;  Location: [facility];  Service: Cardiovascular;  Laterality: N/A;   LEFT HEART CATH AND CORONARY ANGIOGRAPHY N/A 07/07/2022   Procedure: LEFT HEART CATH AND CORONARY ANGIOGRAPHY;  Surgeon: Nigel Mormon, MD;  Location: [facility];  Service: Cardiovascular;  Laterality: N/A;    ORIF ANKLE FRACTURE Left 08/15/2007   Distal fibular   REPLACEMENT TOTAL KNEE Right 07/12/2012   RIGHT/LEFT HEART CATH AND CORONARY ANGIOGRAPHY N/A 01/12/2018   Procedure: RIGHT/LEFT HEART CATH AND CORONARY ANGIOGRAPHY;  Surgeon: Belva Crome, MD;  Location: [facility];  Service: Cardiovascular;  Laterality: N/A;   RIGHT/LEFT HEART CATH AND CORONARY ANGIOGRAPHY N/A 05/19/2022   Procedure: RIGHT/LEFT HEART CATH AND CORONARY ANGIOGRAPHY;  Surgeon: Nigel Mormon, MD;  Location: [facility];  Service: Cardiovascular;  Laterality: N/A;   SHOULDER SURGERY Right    UMBILICAL HERNIA REPAIR  06/23/2013    Medications: Prior to Admission medications   Medication Sig Start Date End Date Taking? Authorizing Provider  acetaminophen (TYLENOL) 325 MG tablet Take 1-2 tablets (325-650 mg total) by mouth every 4 (four) hours as needed for mild pain. 07/22/22   Setzer, Edman Circle, PA-C  amLODipine (NORVASC) 10 MG tablet Take 10 mg by mouth in the morning. Heart Medication 06/24/20   [provider]  aspirin 81 MG chewable tablet Chew 81 mg by mouth daily at 12 noon. Heart medication    [provider]  atorvastatin (LIPITOR) 80 MG tablet Take 1 tablet (80 mg total) by mouth at bedtime. 07/22/22   Setzer, Edman Circle, PA-C  Calcium Carbonate-Vitamin D 600-400 MG-UNIT tablet Take 1 tablet by mouth 2 (two) times daily. 04/17/20   [provider]  cetirizine (ZYRTEC) 10 MG tablet Take 10 mg by mouth daily as needed for allergies.    [provider]  clopidogrel (PLAVIX) 75 MG tablet Take 1 tablet (75 mg total) by mouth daily with breakfast. 07/23/22   Setzer, Edman Circle, PA-C  colchicine 0.6 MG tablet Take 1 tablet (0.6 mg total) by mouth 2 (two) times daily. 02/24/22   Marylu Lund., NP  diclofenac Sodium (VOLTAREN) 1 % GEL Apply 1 application topically 4 (four) times daily as needed (pain).    [provider]  DULoxetine (CYMBALTA) 60 MG capsule  Take 1 capsule (60 mg total) by mouth every evening. 07/22/22   Setzer, Edman Circle, PA-C  empagliflozin (JARDIANCE) 25 MG TABS tablet Take 1 tablet (25 mg total) by mouth daily before breakfast. 07/23/22   Setzer, Edman Circle, PA-C  EPINEPHrine 0.3 mg/0.3 mL IJ SOAJ injection Inject 0.3 mg into the muscle daily as needed (for anaphylaxis).     [provider]  ezetimibe (ZETIA) 10 MG tablet Take 1 tablet (10 mg total) by mouth daily. 07/22/22   Setzer, Edman Circle, PA-C  furosemide (LASIX) 40 MG tablet Take 1 tablet (40 mg total) by mouth daily. Patient not taking: Reported on 07/23/2022 12/24/21   Richardo Priest, MD  isosorbide mononitrate (IMDUR) 30 MG 24 hr tablet Take 1 tablet (30 mg total) by mouth daily. 05/27/22 08/25/22  Custovic, Collene Mares, DO  methadone (DOLOPHINE) 5 MG tablet Take 5 mg by mouth every other day. For severe chronic pain    [provider]  metoprolol succinate (TOPROL XL) 25 MG 24 hr tablet Take 1 tablet (25 mg total) by mouth daily. 05/27/22   Custovic, Collene Mares, DO  nitroGLYCERIN (NITROSTAT) 0.4 MG SL tablet Place 1 tablet (0.4 mg total) under the tongue every 5 (five) minutes  as needed for chest pain. 07/22/22 10/20/22  Setzer, Edman Circle, PA-C  oxyCODONE (OXY IR/ROXICODONE) 5 MG immediate release tablet Take 5 mg by mouth 4 (four) times daily as needed (severe chronic pain).     [provider]  tamsulosin (FLOMAX) 0.4 MG CAPS capsule Take 1 capsule (0.4 mg total) by mouth at bedtime. Patient not taking: Reported on 07/23/2022 07/22/22   Barbie Banner, PA-C  tiZANidine (ZANAFLEX) 4 MG tablet Take 1 tablet (4 mg total) by mouth at bedtime. 07/22/22   Setzer, Edman Circle, PA-C  Varenicline Tartrate, Starter, (CHANTIX STARTING MONTH PAK) 0.5 MG X 11 & 1 MG X 42 TBPK Take 1 tablet by mouth daily. Patient not taking: Reported on 07/23/2022 07/01/22   Custovic, Collene Mares, DO  Vibegron 75 MG TABS Take 75 mg by mouth daily. 07/22/22   Setzer, Edman Circle, PA-C     Allergies:   Allergies  Allergen Reactions   Wasp Venom Anaphylaxis, Itching and Swelling    Social History:  reports that he has been smoking cigarettes. He has a 27.50 pack-year smoking history. He has been exposed to tobacco smoke. He has never used smokeless tobacco. He reports that he does not drink alcohol and does not use drugs.  Family History: Family History  Problem Relation Age of Onset   Hypertension Mother    Hypertension Father    Alzheimer's disease Father    Heart disease Sister    Heart disease Paternal Aunt     Physical Exam: Vitals:   07/25/22 1907 07/25/22 1908  BP: (!) 163/83   Pulse: 85   Resp: 17   Temp: 98.1 F (36.7 C)   TempSrc: Oral   SpO2: 93%   Weight:  92 kg  Height:  _0  (1.803 m)    General: Arousable, dazed.  Difficult to verify patient is oriented x3 as patient will not verbalize no more than 1 word intermittently.  Weak.  Well developed and nourished Eyes: PERRLA, pink conjunctiva, no scleral icterus ENT: Moist oral mucosa, neck supple, no thyromegaly Lungs: clear to ascultation, no wheeze, no crackles, no use of accessory muscles Cardiovascular: regular rate and rhythm, no regurgitation, no gallops, no murmurs. No carotid bruits, no JVD Abdomen: soft, positive BS, non-tender, non-distended, no organomegaly, not an acute abdomen GU: not examined Neuro: CN II - XII grossly intact, sensation intact Musculoskeletal: strength 5/5 all extremities, no clubbing, cyanosis or edema Skin: no rash, no subcutaneous crepitation, no decubitus Psych: Awake, however sluggish patient   Labs on Admission:  Recent Labs    07/25/22 1826 07/25/22 1833  NA 141 139  K 3.6 3.6  CL 106 105  CO2 22  --   GLUCOSE 181* 178*  BUN 16 18  CREATININE 1.60* 1.60*  CALCIUM 9.4  --    Recent Labs    07/25/22 1826  AST 25  ALT 44  ALKPHOS 115  BILITOT 0.7  PROT 6.1*  ALBUMIN 3.7    Recent Labs    07/25/22 1826 07/25/22 1833  WBC 7.7   --   NEUTROABS 5.0  --   HGB 13.7 12.6*  HCT 39.1 37.0*  MCV 90.5  --   PLT 216  --      Radiological Exams on Admission: MR BRAIN WO CONTRAST  Result Date: 07/25/2022 CLINICAL DATA:  Sudden onset of confusion, abnormal speech and generalized weakness head 1 p.m. today. Acute infarcts are earlier this month following cardiac stenting. EXAM: MRI HEAD WITHOUT CONTRAST TECHNIQUE: Multiplanar,  multiecho pulse sequences of the brain and surrounding structures were obtained without intravenous contrast. COMPARISON:  MR head 07/08/2022 FINDINGS: Brain: Residual area of restricted diffusion is present in the scratched at 2 residual areas of restricted diffusion are present in the right corona radiata and centrum semi ovale. Minimal residual decreased signal on the ADC map noted in the more posterior lesion. Majority of signal both lesions is T2 shine through. No new infarcts are present. No acute hemorrhage or mass lesion is present. Mild generalized atrophy and white matter disease otherwise is stable. The ventricles are proportionate to the degree of atrophy. No significant extraaxial fluid collection is present. White matter changes extend into the brainstem. 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 Vascular: Flow is present in the major intracranial arteries. Skull and upper cervical spine: The craniocervical junction is normal. Upper cervical spine is within normal limits. Marrow signal is unremarkable. Sinuses/Orbits: Mild mucosal thickening is present in the inferior left maxillary sinus. The paranasal sinuses and mastoid air cells are otherwise clear. Right lens replacement is present. Globes and orbits are otherwise within normal limits. IMPRESSION: 1. Residual areas of restricted diffusion in the right corona radiata and centrum semi ovale compatible with evolving infarcts. 2. No new infarcts. 3. Stable atrophy and white matter disease likely reflects the sequela of chronic  microvascular ischemia. 4. 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 5. Mild left maxillary sinus disease. Electronically Signed   By: San Morelle M.D.   On: 07/25/2022 19:51   CT ANGIO HEAD NECK W WO CM W PERF (CODE STROKE)  Result Date: 07/25/2022 CLINICAL DATA:  MRI Brain 07/09/22 EXAM: CT ANGIOGRAPHY HEAD AND NECK CT PERFUSION BRAIN TECHNIQUE: Multidetector CT imaging of the head and neck was performed using the standard protocol during bolus administration of intravenous contrast. Multiplanar CT image reconstructions and MIPs were obtained to evaluate the vascular anatomy. Carotid stenosis measurements (when applicable) are obtained utilizing NASCET criteria, using the distal internal carotid diameter as the denominator. Multiphase CT imaging of the brain was performed following IV bolus contrast injection. Subsequent parametric perfusion maps were calculated using RAPID software. RADIATION DOSE REDUCTION: This exam was performed according to the departmental dose-optimization program which includes automated exposure control, adjustment of the mA and/or kV according to patient size and/or use of iterative reconstruction technique. CONTRAST:  137m OMNIPAQUE IOHEXOL 350 MG/ML SOLN COMPARISON:  None Available. FINDINGS: CT HEAD FINDINGS See same day CT brain no contrast-enhancing lesions visualized. Left maxillary sinus mucosal thickening, likely odontogenic1. CTA NECK FINDINGS Aortic arch: Standard branching. Imaged portion shows no evidence of aneurysm or dissection. There is mild-to-moderate narrowing of the right subclavian artery proximal to the takeoff of the subclavian artery. Right carotid system: No evidence of dissection, stenosis (50% or greater), or occlusion. Left carotid system: No evidence of dissection, stenosis (50% or greater), or occlusion. Vertebral arteries: Mild narrowing of the origin of the left vertebral artery. Mild narrowing in the proximal V2 segment of  the right vertebral artery. Otherwise no evidence of hemodynamically significant stenosis. Skeleton: Degenerative changes with a small disc bulge at C3-C4, 1 C5-C6. Other neck: Negative. Upper chest: Negative. Review of the MIP images confirms the above findings CTA HEAD FINDINGS Anterior circulation: Moderate stenosis of the proximal cavernous segment of the left ICA (series 9, image 124). 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 Mild narrowing in the M1 segment of the right MCA. Moderate to severe  narrowing of the M2 segment of the right MCA (series 9, image 110/series 11, image 69). Moderate stenosis of the M2 segment of the left MCA (series 9, image 114). Posterior circulation: Moderate stenosis of the P2 segment of the right PCA (series 9, image 97). Moderate stenosis of the P2 segment of the left PCA (series 9, image 109). Venous sinuses: As permitted by contrast timing, patent. Anatomic variants: None CT Brain Perfusion Findings: ASPECTS: 10 CBF (<30%) Volume: 38m Perfusion (Tmax>6.0s) volume: 071mMismatch Volume: 77m34mnfarction Location:Negative for infarct Review of the MIP images confirms the above findings IMPRESSION: 1. No evidence of perfusion abnormality. 2. No intracranial large vessel occlusion. Moderate to severe narrowing of the M2 segment of the bilateral MCA and PCAs, as seen previopusly. 3. Moderate stenosis of the proximal cavernous segment of the left ICA. 4. 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 5. Mild-to-moderate narrowing of the right subclavian artery proximal to the takeoff of the subclavian artery. Electronically Signed   By: HemMarin RobertsD.   On: 07/25/2022 19:06   CT HEAD CODE STROKE WO CONTRAST  Result Date: 07/25/2022 CLINICAL DATA:  Code stroke.  Stroke suspected EXAM: CT HEAD WITHOUT CONTRAST TECHNIQUE: Contiguous axial images were obtained from the base of the skull through the vertex  without intravenous contrast. RADIATION DOSE REDUCTION: This exam was performed according to the departmental dose-optimization program which includes automated exposure control, adjustment of the mA and/or kV according to patient size and/or use of iterative reconstruction technique. COMPARISON:  MRI Brain 07/08/22 FINDINGS: Brain: No evidence of acute infarction, hydrocephalus, extra-axial collection or mass lesion/mass effect. Redemonstrated sequela of chronic microvascular ischemic change with likely small superimposed infarcts in the right corona radiata (series 100, image 22), left temporal lobe. No definite sites new infarcts are visualized. No evidence of hemorrhage. Vascular: No hyperdense vessel or unexpected calcification. Skull: Normal. Negative for fracture or focal lesion. Sinuses/Orbits: Right lens replacement. Other: None ASPECTS ([place] Program Early CT Score): 10 IMPRESSION: No CT evidence of new infarct.  No hemorrhage. Findings were paged to Dr. AroRory Percy 07/25/22 at 6:48 PM via [facility] system. Electronically Signed   By: HemMarin RobertsD.   On: 07/25/2022 18:49    Assessment/Plan Present on Admission:  Stroke in evolution/acute metabolic encephalopathy -Admit to med telemetry -TIA order set initiated -Neuro consult placed, has seen patient in ER -Recommendations include MRI head, done.  EEG, Lipitor 80, aspirin and Plavix continued.  No need for repeat full stroke work-up.  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   Essential hypertension -Permissive hypertension   Ieesha Abbasi 07/25/2022, 9:26 PM

## 2022-07-25 NOTE — ED Provider Notes (Signed)
[place] EMERGENCY DEPARTMENT Provider Note   CSN: [id number] Arrival date & time: 07/25/22  1824  An emergency department physician performed an initial assessment on this suspected stroke patient at [facility].  History  Chief Complaint  Patient presents with   Code Stroke    Thomas [name] is a 76 y.o. male present emerged department with complaint of difficulty with speech and confusion.  The patient was admitted to the hospital for a stroke in the setting of the PCI earlier this month, had been doing well until earlier today.  He been home a few days since rehab.  Last noted to be well around 1 PM.  His wife then noted that he was having trouble with his speech or watching a movie.    HPI     Home Medications Prior to Admission medications   Medication Sig Start Date End Date Taking? Authorizing Provider  acetaminophen (TYLENOL) 325 MG tablet Take 1-2 tablets (325-650 mg total) by mouth every 4 (four) hours as needed for mild pain. 07/22/22   Setzer, Edman Circle, PA-C  amLODipine (NORVASC) 10 MG tablet Take 10 mg by mouth in the morning. Heart Medication 06/24/20   [provider]  aspirin 81 MG chewable tablet Chew 81 mg by mouth daily at 12 noon. Heart medication    [provider]  atorvastatin (LIPITOR) 80 MG tablet Take 1 tablet (80 mg total) by mouth at bedtime. 07/22/22   Setzer, Edman Circle, PA-C  Calcium Carbonate-Vitamin D 600-400 MG-UNIT tablet Take 1 tablet by mouth 2 (two) times daily. 04/17/20   [provider]  cetirizine (ZYRTEC) 10 MG tablet Take 10 mg by mouth daily as needed for allergies.    [provider]  clopidogrel (PLAVIX) 75 MG tablet Take 1 tablet (75 mg total) by mouth daily with breakfast. 07/23/22   Setzer, Edman Circle, PA-C  colchicine 0.6 MG tablet Take 1 tablet (0.6 mg total) by mouth 2 (two) times daily. 02/24/22   Marylu Lund., NP  diclofenac Sodium (VOLTAREN) 1 % GEL Apply 1 application topically  4 (four) times daily as needed (pain).    [provider]  DULoxetine (CYMBALTA) 60 MG capsule Take 1 capsule (60 mg total) by mouth every evening. 07/22/22   Setzer, Edman Circle, PA-C  empagliflozin (JARDIANCE) 25 MG TABS tablet Take 1 tablet (25 mg total) by mouth daily before breakfast. 07/23/22   Setzer, Edman Circle, PA-C  EPINEPHrine 0.3 mg/0.3 mL IJ SOAJ injection Inject 0.3 mg into the muscle daily as needed (for anaphylaxis).     [provider]  ezetimibe (ZETIA) 10 MG tablet Take 1 tablet (10 mg total) by mouth daily. 07/22/22   Setzer, Edman Circle, PA-C  furosemide (LASIX) 40 MG tablet Take 1 tablet (40 mg total) by mouth daily. Patient not taking: Reported on 07/23/2022 12/24/21   Richardo Priest, MD  isosorbide mononitrate (IMDUR) 30 MG 24 hr tablet Take 1 tablet (30 mg total) by mouth daily. 05/27/22 08/25/22  Custovic, Collene Mares, DO  methadone (DOLOPHINE) 5 MG tablet Take 5 mg by mouth every other day. For severe chronic pain    [provider]  metoprolol succinate (TOPROL XL) 25 MG 24 hr tablet Take 1 tablet (25 mg total) by mouth daily. 05/27/22   Custovic, Collene Mares, DO  nitroGLYCERIN (NITROSTAT) 0.4 MG SL tablet Place 1 tablet (0.4 mg total) under the tongue every 5 (five) minutes as needed for chest pain. 07/22/22 10/20/22  Risa Grill  J, PA-C  oxyCODONE (OXY IR/ROXICODONE) 5 MG immediate release tablet Take 5 mg by mouth 4 (four) times daily as needed (severe chronic pain).     [provider]  tamsulosin (FLOMAX) 0.4 MG CAPS capsule Take 1 capsule (0.4 mg total) by mouth at bedtime. Patient not taking: Reported on 07/23/2022 07/22/22   Barbie Banner, PA-C  tiZANidine (ZANAFLEX) 4 MG tablet Take 1 tablet (4 mg total) by mouth at bedtime. 07/22/22   Setzer, Edman Circle, PA-C  Varenicline Tartrate, Starter, (CHANTIX STARTING MONTH PAK) 0.5 MG X 11 & 1 MG X 42 TBPK Take 1 tablet by mouth daily. Patient not taking: Reported on 07/23/2022 07/01/22   Custovic,  Collene Mares, DO  Vibegron 75 MG TABS Take 75 mg by mouth daily. 07/22/22   Setzer, Edman Circle, PA-C      Allergies    Wasp venom    Review of Systems   Review of Systems  Physical Exam Updated Vital Signs BP (!) 163/83 (BP Location: Right Arm)   Pulse 85   Temp 98.1 F (36.7 C) (Oral)   Resp 17   Ht _0  (1.803 m)   Wt 92 kg   SpO2 93%   BMI 28.29 kg/m  Physical Exam Constitutional:      General: He is not in acute distress. HENT:     Head: Normocephalic and atraumatic.  Eyes:     Conjunctiva/sclera: Conjunctivae normal.     Pupils: Pupils are equal, round, and reactive to light.  Cardiovascular:     Rate and Rhythm: Normal rate and regular rhythm.  Pulmonary:     Effort: Pulmonary effort is normal. No respiratory distress.  Abdominal:     General: There is no distension.     Tenderness: There is no abdominal tenderness.  Skin:    General: Skin is warm and dry.  Neurological:     Mental Status: He is alert.     Comments: Dysarthric speech, patient is able to follow commands No facial droop, moving all extremities     ED Results / Procedures / Treatments   Labs (all labs ordered are listed, but only abnormal results are displayed) Labs Reviewed  COMPREHENSIVE METABOLIC PANEL - Abnormal; Notable for the following components:      Result Value   Glucose, Bld 181 (*)    Creatinine, Ser 1.60 (*)    Total Protein 6.1 (*)    GFR, Estimated 44 (*)    All other components within normal limits  I-STAT CHEM 8, ED - Abnormal; Notable for the following components:   Creatinine, Ser 1.60 (*)    Glucose, Bld 178 (*)    Calcium, Ion 1.10 (*)    Hemoglobin 12.6 (*)    HCT 37.0 (*)    All other components within normal limits  CBG MONITORING, ED - Abnormal; Notable for the following components:   Glucose-Capillary 165 (*)    All other components within normal limits  PROTIME-INR  APTT  CBC  DIFFERENTIAL  ETHANOL    EKG EKG Interpretation  Date/Time:  Saturday  July 25 2022 20:10:27 EST Ventricular Rate:  83 PR Interval:  200 QRS Duration: 108 QT Interval:  429 QTC Calculation: 505 R Axis:   -61 Text Interpretation: Sinus rhythm Multiple ventricular premature complexes Left anterior fascicular block Abnormal R-wave progression, late transition LVH with secondary repolarization abnormality Prolonged QT interval Confirmed by Octaviano Glow [phone]) on 07/25/2022 8:21:21 PM  Radiology MR BRAIN WO CONTRAST  Result Date: 07/25/2022  CLINICAL DATA:  Sudden onset of confusion, abnormal speech and generalized weakness head 1 p.m. today. Acute infarcts are earlier this month following cardiac stenting. EXAM: MRI HEAD WITHOUT CONTRAST TECHNIQUE: Multiplanar, multiecho pulse sequences of the brain and surrounding structures were obtained without intravenous contrast. COMPARISON:  MR head 07/08/2022 FINDINGS: Brain: Residual area of restricted diffusion is present in the scratched at 2 residual areas of restricted diffusion are present in the right corona radiata and centrum semi ovale. Minimal residual decreased signal on the ADC map noted in the more posterior lesion. Majority of signal both lesions is T2 shine through. No new infarcts are present. No acute hemorrhage or mass lesion is present. Mild generalized atrophy and white matter disease otherwise is stable. The ventricles are proportionate to the degree of atrophy. No significant extraaxial fluid collection is present. White matter changes extend into the brainstem. 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 Vascular: Flow is present in the major intracranial arteries. Skull and upper cervical spine: The craniocervical junction is normal. Upper cervical spine is within normal limits. Marrow signal is unremarkable. Sinuses/Orbits: Mild mucosal thickening is present in the inferior left maxillary sinus. The paranasal sinuses and mastoid air cells are otherwise clear. Right lens replacement is  present. Globes and orbits are otherwise within normal limits. IMPRESSION: 1. Residual areas of restricted diffusion in the right corona radiata and centrum semi ovale compatible with evolving infarcts. 2. No new infarcts. 3. Stable atrophy and white matter disease likely reflects the sequela of chronic microvascular ischemia. 4. 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 5. Mild left maxillary sinus disease. Electronically Signed   By: San Morelle M.D.   On: 07/25/2022 19:51   CT ANGIO HEAD NECK W WO CM W PERF (CODE STROKE)  Result Date: 07/25/2022 CLINICAL DATA:  MRI Brain 07/09/22 EXAM: CT ANGIOGRAPHY HEAD AND NECK CT PERFUSION BRAIN TECHNIQUE: Multidetector CT imaging of the head and neck was performed using the standard protocol during bolus administration of intravenous contrast. Multiplanar CT image reconstructions and MIPs were obtained to evaluate the vascular anatomy. Carotid stenosis measurements (when applicable) are obtained utilizing NASCET criteria, using the distal internal carotid diameter as the denominator. Multiphase CT imaging of the brain was performed following IV bolus contrast injection. Subsequent parametric perfusion maps were calculated using RAPID software. RADIATION DOSE REDUCTION: This exam was performed according to the departmental dose-optimization program which includes automated exposure control, adjustment of the mA and/or kV according to patient size and/or use of iterative reconstruction technique. CONTRAST:  159m OMNIPAQUE IOHEXOL 350 MG/ML SOLN COMPARISON:  None Available. FINDINGS: CT HEAD FINDINGS See same day CT brain no contrast-enhancing lesions visualized. Left maxillary sinus mucosal thickening, likely odontogenic1. CTA NECK FINDINGS Aortic arch: Standard branching. Imaged portion shows no evidence of aneurysm or dissection. There is mild-to-moderate narrowing of the right subclavian artery proximal to the takeoff of the subclavian  artery. Right carotid system: No evidence of dissection, stenosis (50% or greater), or occlusion. Left carotid system: No evidence of dissection, stenosis (50% or greater), or occlusion. Vertebral arteries: Mild narrowing of the origin of the left vertebral artery. Mild narrowing in the proximal V2 segment of the right vertebral artery. Otherwise no evidence of hemodynamically significant stenosis. Skeleton: Degenerative changes with a small disc bulge at C3-C4, 1 C5-C6. Other neck: Negative. Upper chest: Negative. Review of the MIP images confirms the above findings CTA HEAD FINDINGS Anterior circulation: Moderate stenosis of the proximal cavernous segment of the left  ICA (series 9, image 124). 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 Mild narrowing in the M1 segment of the right MCA. Moderate to severe narrowing of the M2 segment of the right MCA (series 9, image 110/series 11, image 69). Moderate stenosis of the M2 segment of the left MCA (series 9, image 114). Posterior circulation: Moderate stenosis of the P2 segment of the right PCA (series 9, image 97). Moderate stenosis of the P2 segment of the left PCA (series 9, image 109). Venous sinuses: As permitted by contrast timing, patent. Anatomic variants: None CT Brain Perfusion Findings: ASPECTS: 10 CBF (<30%) Volume: 45m Perfusion (Tmax>6.0s) volume: 054mMismatch Volume: 108m96mnfarction Location:Negative for infarct Review of the MIP images confirms the above findings IMPRESSION: 1. No evidence of perfusion abnormality. 2. No intracranial large vessel occlusion. Moderate to severe narrowing of the M2 segment of the bilateral MCA and PCAs, as seen previopusly. 3. Moderate stenosis of the proximal cavernous segment of the left ICA. 4. 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 5. Mild-to-moderate narrowing of the right subclavian artery proximal to the takeoff of the subclavian  artery. Electronically Signed   By: HemMarin RobertsD.   On: 07/25/2022 19:06   CT HEAD CODE STROKE WO CONTRAST  Result Date: 07/25/2022 CLINICAL DATA:  Code stroke.  Stroke suspected EXAM: CT HEAD WITHOUT CONTRAST TECHNIQUE: Contiguous axial images were obtained from the base of the skull through the vertex without intravenous contrast. RADIATION DOSE REDUCTION: This exam was performed according to the departmental dose-optimization program which includes automated exposure control, adjustment of the mA and/or kV according to patient size and/or use of iterative reconstruction technique. COMPARISON:  MRI Brain 07/08/22 FINDINGS: Brain: No evidence of acute infarction, hydrocephalus, extra-axial collection or mass lesion/mass effect. Redemonstrated sequela of chronic microvascular ischemic change with likely small superimposed infarcts in the right corona radiata (series 100, image 22), left temporal lobe. No definite sites new infarcts are visualized. No evidence of hemorrhage. Vascular: No hyperdense vessel or unexpected calcification. Skull: Normal. Negative for fracture or focal lesion. Sinuses/Orbits: Right lens replacement. Other: None ASPECTS ([place] [facility] Early CT Score): 10 IMPRESSION: No CT evidence of new infarct.  No hemorrhage. Findings were paged to Dr. AroRory Percy 07/25/22 at 6:48 PM via [facility] system. Electronically Signed   By: HemMarin RobertsD.   On: 07/25/2022 18:49    Procedures Procedures    Medications Ordered in ED Medications  sodium chloride flush (NS) 0.9 % injection 3 mL (has no administration in time range)  iohexol (OMNIPAQUE) 350 MG/ML injection 100 mL (100 mLs Intravenous Contrast Given 07/25/22 1847)    ED Course/ Medical Decision Making/ A&P Clinical Course as of 07/25/22 2033  Sat Jul 25, 2022  1831 At CT with neurology, pt assessed on arrival, airway patent [MT]  2032 Reviewed MRI with neurologist.  Recommending admission to the hospitalist, for  EEG and monitoring overnight.  Evolving infarcts noted on MRI [MT]    Clinical Course User Index [MT] Silvanna Ohmer, MatCarola RhineD                           Medical Decision Making Amount and/or Complexity of Data Reviewed Labs: ordered. Radiology: ordered.  Risk Decision regarding hospitalization.   This patient presents to the ED with concern for altered mental status, slow speech, confusion. This involves an extensive number  of treatment options, and is a complaint that carries with it a high risk of complications and morbidity.  The differential diagnosis includes CVA versus recrudescence versus subclinical seizure versus metabolic encephalopathy versus other  Co-morbidities that complicate the patient evaluation: Several risk factors at high risk for recurring stroke  Additional history obtained from EMS  External records from outside source obtained and reviewed including recent discharge records  I ordered and personally interpreted labs.  The pertinent results include: Lab baseline levels  I ordered imaging studies including CT head, CTA, MRI I independently visualized and interpreted imaging which showed no acute stroke noted on CT head.  There is some M2 level stenosis on the CTA, moderate to severe.  MRI shows evolving infarcts; no acute or new infarcts I agree with the radiologist interpretation  The patient was maintained on a cardiac monitor.  I personally viewed and interpreted the cardiac monitored which showed an underlying rhythm of: Regular heart rate  Per my interpretation the patient's ECG shows no acute ischemic findings   I have reviewed the patients home medicines and have made adjustments as needed  I requested consultation with the neurology,  and discussed lab and imaging findings as well as pertinent plan - they recommend: MRI of the brain to evaluate for TIA or smaller stroke, given his history.  If imaging is positive he will need readmission.  If negative  anticipate still admission for EEG monitoring for possible seizure.  Neurology consultation was appreciated  After the interventions noted above, I reevaluated the patient and found that they have: improved -per EMS report overall his symptoms and speech appear to have improved but are still slow and do not appear back to baseline   Dispostion:  After consideration of the diagnostic results and the patients response to treatment, I feel that the patent would benefit from medical admission         Final Clinical Impression(s) / ED Diagnoses Final diagnoses:  Slurred speech    Rx / DC Orders ED Discharge Orders     None         Wyvonnia Dusky, MD 07/25/22 2033

## 2022-07-26 ENCOUNTER — Observation Stay (HOSPITAL_COMMUNITY): Payer: Medicare Other

## 2022-07-26 DIAGNOSIS — F332 Major depressive disorder, recurrent severe without psychotic features: Secondary | ICD-10-CM | POA: Diagnosis present

## 2022-07-26 DIAGNOSIS — R4182 Altered mental status, unspecified: Secondary | ICD-10-CM

## 2022-07-26 DIAGNOSIS — Z1152 Encounter for screening for COVID-19: Secondary | ICD-10-CM | POA: Diagnosis not present

## 2022-07-26 DIAGNOSIS — E1142 Type 2 diabetes mellitus with diabetic polyneuropathy: Secondary | ICD-10-CM | POA: Diagnosis present

## 2022-07-26 DIAGNOSIS — Z79899 Other long term (current) drug therapy: Secondary | ICD-10-CM | POA: Diagnosis not present

## 2022-07-26 DIAGNOSIS — I639 Cerebral infarction, unspecified: Secondary | ICD-10-CM | POA: Diagnosis present

## 2022-07-26 DIAGNOSIS — F0392 Unspecified dementia, unspecified severity, with psychotic disturbance: Secondary | ICD-10-CM | POA: Diagnosis present

## 2022-07-26 DIAGNOSIS — Z7984 Long term (current) use of oral hypoglycemic drugs: Secondary | ICD-10-CM | POA: Diagnosis not present

## 2022-07-26 DIAGNOSIS — E785 Hyperlipidemia, unspecified: Secondary | ICD-10-CM | POA: Diagnosis present

## 2022-07-26 DIAGNOSIS — Z7982 Long term (current) use of aspirin: Secondary | ICD-10-CM | POA: Diagnosis not present

## 2022-07-26 DIAGNOSIS — Z66 Do not resuscitate: Secondary | ICD-10-CM | POA: Diagnosis present

## 2022-07-26 DIAGNOSIS — I130 Hypertensive heart and chronic kidney disease with heart failure and stage 1 through stage 4 chronic kidney disease, or unspecified chronic kidney disease: Secondary | ICD-10-CM | POA: Diagnosis present

## 2022-07-26 DIAGNOSIS — G9341 Metabolic encephalopathy: Secondary | ICD-10-CM | POA: Diagnosis not present

## 2022-07-26 DIAGNOSIS — G928 Other toxic encephalopathy: Secondary | ICD-10-CM | POA: Diagnosis present

## 2022-07-26 DIAGNOSIS — I5032 Chronic diastolic (congestive) heart failure: Secondary | ICD-10-CM | POA: Diagnosis present

## 2022-07-26 DIAGNOSIS — R29704 NIHSS score 4: Secondary | ICD-10-CM | POA: Diagnosis present

## 2022-07-26 DIAGNOSIS — R45851 Suicidal ideations: Secondary | ICD-10-CM | POA: Diagnosis present

## 2022-07-26 DIAGNOSIS — F17210 Nicotine dependence, cigarettes, uncomplicated: Secondary | ICD-10-CM | POA: Diagnosis present

## 2022-07-26 DIAGNOSIS — E1122 Type 2 diabetes mellitus with diabetic chronic kidney disease: Secondary | ICD-10-CM | POA: Diagnosis present

## 2022-07-26 DIAGNOSIS — Z820 Family history of epilepsy and other diseases of the nervous system: Secondary | ICD-10-CM | POA: Diagnosis not present

## 2022-07-26 DIAGNOSIS — F0393 Unspecified dementia, unspecified severity, with mood disturbance: Secondary | ICD-10-CM | POA: Diagnosis present

## 2022-07-26 DIAGNOSIS — R531 Weakness: Secondary | ICD-10-CM | POA: Diagnosis present

## 2022-07-26 DIAGNOSIS — R4781 Slurred speech: Secondary | ICD-10-CM

## 2022-07-26 DIAGNOSIS — R451 Restlessness and agitation: Secondary | ICD-10-CM | POA: Diagnosis not present

## 2022-07-26 DIAGNOSIS — N1831 Chronic kidney disease, stage 3a: Secondary | ICD-10-CM | POA: Diagnosis present

## 2022-07-26 DIAGNOSIS — I69352 Hemiplegia and hemiparesis following cerebral infarction affecting left dominant side: Secondary | ICD-10-CM | POA: Diagnosis not present

## 2022-07-26 DIAGNOSIS — R4701 Aphasia: Secondary | ICD-10-CM | POA: Diagnosis present

## 2022-07-26 DIAGNOSIS — Z8249 Family history of ischemic heart disease and other diseases of the circulatory system: Secondary | ICD-10-CM | POA: Diagnosis not present

## 2022-07-26 LAB — LIPID PANEL
Cholesterol: 74 mg/dL (ref 0–200)
HDL: 25 mg/dL — ABNORMAL LOW (ref >40)
LDL Cholesterol: 38 mg/dL (ref 0–99)
Total CHOL/HDL Ratio: 3 RATIO
Triglycerides: 54 mg/dL (ref <150)
VLDL: 11 mg/dL (ref 0–40)

## 2022-07-26 LAB — URINALYSIS, ROUTINE W REFLEX MICROSCOPIC
Bacteria, UA: NONE SEEN
Bilirubin Urine: NEGATIVE
Glucose, UA: >=500 mg/dL — AB
Ketones, ur: 20 mg/dL — AB
Leukocytes,Ua: NEGATIVE
Nitrite: NEGATIVE
Protein, ur: >=300 mg/dL — AB
Specific Gravity, Urine: 1.035 — ABNORMAL HIGH (ref 1.005–1.030)
pH: 5 (ref 5.0–8.0)

## 2022-07-26 LAB — CBC
HCT: 37.4 % — ABNORMAL LOW (ref 39.0–52.0)
Hemoglobin: 12.8 g/dL — ABNORMAL LOW (ref 13.0–17.0)
MCH: 31.4 pg (ref 26.0–34.0)
MCHC: 34.2 g/dL (ref 30.0–36.0)
MCV: 91.9 fL (ref 80.0–100.0)
Platelets: 203 10*3/uL (ref 150–400)
RBC: 4.07 MIL/uL — ABNORMAL LOW (ref 4.22–5.81)
RDW: 12.6 % (ref 11.5–15.5)
WBC: 7 10*3/uL (ref 4.0–10.5)
nRBC: 0 % (ref 0.0–0.2)

## 2022-07-26 LAB — CREATININE, SERUM
Creatinine, Ser: 1.51 mg/dL — ABNORMAL HIGH (ref 0.61–1.24)
GFR, Estimated: 47 mL/min — ABNORMAL LOW (ref >60)

## 2022-07-26 LAB — PROCALCITONIN: Procalcitonin: <0.1 ng/mL

## 2022-07-26 MED ORDER — LORAZEPAM 2 MG/ML IJ SOLN
1.0000 mg | Freq: Once | INTRAMUSCULAR | Status: AC | PRN
  Administered 2022-07-26: 1 mg via INTRAVENOUS
  Filled 2022-07-26: qty 1

## 2022-07-26 MED ORDER — HALOPERIDOL LACTATE 5 MG/ML IJ SOLN
5.0000 mg | Freq: Once | INTRAMUSCULAR | Status: AC | PRN
  Administered 2022-07-26: 5 mg via INTRAVENOUS
  Filled 2022-07-26: qty 1

## 2022-07-26 MED ORDER — OLANZAPINE 2.5 MG PO TABS
5.0000 mg | ORAL_TABLET | Freq: Every day | ORAL | Status: DC
  Administered 2022-07-26 – 2022-07-28 (×3): 5 mg via ORAL
  Filled 2022-07-26 (×3): qty 2

## 2022-07-26 MED ORDER — SODIUM CHLORIDE 0.9 % IV SOLN: INTRAVENOUS | Status: AC

## 2022-07-26 NOTE — Progress Notes (Signed)
Patient is disoriented and severely agitated, posing a risk to himself despite non-pharmacologic interventions and scheduled Zyprexa this evening. QT was prolonged on recent EKG. Plan to use delirium precautions, one dose of Ativan now, repeat EKG.

## 2022-07-26 NOTE — Progress Notes (Signed)
Neurology Progress Note   S:// Seen and examined this morning.  Working with PT. Reports that he is in the hospital with his wife wants to take all his money. I am not sure if this is something concerning going on in his household or if this is some sort of hallucination.  He seems to be oriented on my exam   O:// Current vital signs: BP (!) 188/84 (BP Location: Right Arm) Comment: RN Notified  Pulse 83   Temp 98.5 F (36.9 C) (Oral)   Resp 20   Ht _0  (1.803 m)   Wt 92 kg   SpO2 95%   BMI 28.29 kg/m  Vital signs in last 24 hours: Temp:  [97.6 F (36.4 C)-98.6 F (37 C)] 98.5 F (36.9 C) (11/19 0842) Pulse Rate:  [80-85] 83 (11/19 0842) Resp:  [10-20] 20 (11/19 0842) BP: (163-188)/(74-84) 188/84 (11/19 0842) SpO2:  [93 %-100 %] 95 % (11/19 0842) Weight:  [92 kg] 92 kg (11/18 1908) General: Awake alert in no distress HEENT: Normocephalic, atraumatic, dry oral mucous membranes Lungs appear clear to auscultation Cardiovascular: Regular rate rhythm Extremities warm well perfused Neurological exam He is awake alert oriented to self and the fact that it is Thanksgiving month but could not tell me that the month is November. He is able to follow all commands Naming comprehension and repetition is intact.  He did not have any difficulty with word finding today. Cranial nerves II through XII intact Motor examination with no drift today.  Left upper extremity is strong, this is improvement from yesterday Sensation intact light touch without extinction Coordination with no dysmetria Walking slowly with a walker  Medications  Current Facility-Administered Medications:     stroke: early stages of recovery book, , Does not apply, Once, Crosley, Debby, MD   0.9 %  sodium chloride infusion, , Intravenous, Continuous, Crosley, Debby, MD, Last Rate: 75 mL/hr at 07/26/22 0127, New Bag at 07/26/22 0127   acetaminophen (TYLENOL) tablet 650 mg, 650 mg, Oral, Q4H PRN **OR**  acetaminophen (TYLENOL) 160 MG/5ML solution 650 mg, 650 mg, Per Tube, Q4H PRN **OR** acetaminophen (TYLENOL) suppository 650 mg, 650 mg, Rectal, Q4H PRN, Crosley, Debby, MD   amLODipine (NORVASC) tablet 10 mg, 10 mg, Oral, Daily, Crosley, Debby, MD   aspirin chewable tablet 81 mg, 81 mg, Oral, Daily, Crosley, Debby, MD   atorvastatin (LIPITOR) tablet 80 mg, 80 mg, Oral, QHS, Crosley, Debby, MD, 80 mg at 07/26/22 0033   clopidogrel (PLAVIX) tablet 75 mg, 75 mg, Oral, Daily, Crosley, Debby, MD   colchicine tablet 0.6 mg, 0.6 mg, Oral, BID, Crosley, Debby, MD, 0.6 mg at 07/26/22 0033   DULoxetine (CYMBALTA) DR capsule 60 mg, 60 mg, Oral, QPM, Crosley, Debby, MD, 60 mg at 07/26/22 0033   empagliflozin (JARDIANCE) tablet 25 mg, 25 mg, Oral, QAC breakfast, Crosley, Debby, MD   enoxaparin (LOVENOX) injection 40 mg, 40 mg, Subcutaneous, Q24H, Crosley, Debby, MD   ezetimibe (ZETIA) tablet 10 mg, 10 mg, Oral, Daily, Crosley, Debby, MD   isosorbide mononitrate (IMDUR) 24 hr tablet 30 mg, 30 mg, Oral, Daily, Crosley, Debby, MD   metoprolol succinate (TOPROL-XL) 24 hr tablet 25 mg, 25 mg, Oral, Daily, Crosley, Debby, MD   senna-docusate (Senokot-S) tablet 1 tablet, 1 tablet, Oral, QHS PRN, Crosley, Debby, MD   tamsulosin (FLOMAX) capsule 0.4 mg, 0.4 mg, Oral, QHS, Crosley, Debby, MD, 0.4 mg at 07/26/22 0033 Labs CBC    Component Value Date/Time   WBC 7.0 07/26/2022  0434   RBC 4.07 (L) 07/26/2022 0434   HGB 12.8 (L) 07/26/2022 0434   HCT 37.4 (L) 07/26/2022 0434   PLT 203 07/26/2022 0434   MCV 91.9 07/26/2022 0434   MCH 31.4 07/26/2022 0434   MCHC 34.2 07/26/2022 0434   RDW 12.6 07/26/2022 0434   LYMPHSABS 1.7 07/25/2022 1826   MONOABS 0.7 07/25/2022 1826   EOSABS 0.2 07/25/2022 1826   BASOSABS 0.1 07/25/2022 1826    CMP     Component Value Date/Time   NA 139 07/25/2022 1833   NA 138 03/09/2022 1103   K 3.6 07/25/2022 1833   CL 105 07/25/2022 1833   CO2 22 07/25/2022 1826   GLUCOSE 178 (H)  07/25/2022 1833   BUN 18 07/25/2022 1833   BUN 25 03/09/2022 1103   CREATININE 1.51 (H) 07/26/2022 0434   CALCIUM 9.4 07/25/2022 1826   PROT 6.1 (L) 07/25/2022 1826   ALBUMIN 3.7 07/25/2022 1826   AST 25 07/25/2022 1826   ALT 44 07/25/2022 1826   ALKPHOS 115 07/25/2022 1826   BILITOT 0.7 07/25/2022 1826   GFRNONAA 47 (L) 07/26/2022 0434   GFRAA >60 12/13/2019 0405    Imaging I have reviewed images in epic and the results pertinent to this consultation are: CT, CTA, CT perfusion and MRI studies without any acute changes. MRI shows evolving right corona radiata stroke with no evidence of acute infarction.  Assessment: 76 year old with recent PCI and recent stroke with embolic looking etiology that is being evaluated with outpatient cardiac monitoring with sudden onset of speech difficulty and confusion that seems to have been resolving by the time he came in and is back to his baseline. Differentials considered at the time of admission were new stroke versus expansion of old stroke versus seizures. MRI is negative for new or expanding stroke.  It only shows evolution of the stroke already seen earlier this month. EEG was unremarkable for any seizure activity. At this time, because of his current presentation is unclear but I would not start him on any antiepileptic at this time because of no clear evidence seizure and EEG also with no focality. Likely toxic metabolic encephalopathy in the setting of mild electrolyte derangements  Recommendations: At this point, I would recommend PT OT recommendations. Continue dual antiplatelets and Lipitor for stroke prevention No need for AEDs due to the discussion above. Follow-up with outpatient neurology in 4 to 6 weeks. If there is any recurrent seizure activity, I would consider AEDs at that time. For his hallucinations, I would recommend getting a psychiatry consultation.  -- Amie Portland, MD Neurologist [facility] Pager:  [phone]
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
## 2022-07-26 NOTE — Procedures (Signed)
Patient Name: Thomas [name]  MRN: [id number]  Epilepsy Attending: Lora Havens  Referring Physician/Provider: Amie Portland, MD  Date: 07/26/2022 Duration: 27.08 mins  Patient history: 76yo M with ams. EEG to evaluate for seizure.  Level of alertness: lethargic   AEDs during EEG study: None  Technical aspects: This EEG study was done with scalp electrodes positioned according to the 10-20 International system of electrode placement. Electrical activity was reviewed with band pass filter of 1-70Hz, sensitivity of 7 uV/mm, display speed of 76m/sec with a 60Hz notched filter applied as appropriate. EEG data were recorded continuously and digitally stored.  Video monitoring was available and reviewed as appropriate.  Description: EEG showed continuous generalized 5 to 6 Hz theta slowing admixed with intermittent 2-3hz delta slowing. Hyperventilation and photic stimulation were not performed.     ABNORMALITY - Continuous slow, generalized  IMPRESSION: This study is suggestive of moderate diffuse encephalopathy, nonspecific etiology. No seizures or epileptiform discharges were seen throughout the recording.  Vista Sawatzky OBarbra Sarks

## 2022-07-26 NOTE — Consult Note (Signed)
[facility] Consult   Reason for Consult: '' Suicidal ideation, altered mental status.'' Referring Physician:  Holli Humbles, MD Patient Identification: Thomas [name] MRN:  [id number] Principal Diagnosis: Slurred speech Diagnosis:  Principal Problem:   Slurred speech Active Problems:   Hyperlipidemia   Hypertensive heart disease with heart failure (HCC)   Sleep apnea   Paresthesia   Chronic diastolic heart failure (HCC)   CKD (chronic kidney disease) stage 3, GFR 30-59 ml/min (HCC)   Coronary artery disease   Essential hypertension   Acute metabolic encephalopathy   Cognitive and behavioral changes   Tobacco dependence   CAD (coronary artery disease)   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 male patient admitted with acute metabolic encephalopathy.  HPI:  Patient  is a 76 year old male with  medical history significant for CHF - diastolic, dyslipidemia, hypertension, chronic severe lower back pain, stroke with left sided weakness and depression. Chart review revealed that patient was recently admitted 03/27/2022 to 07/10/2022, sent to rehab from 07/10/2022 to 07/22/2022. He was brought back to the ED within approximately 72hrs after discharge from rehab due to worsening mental status and confusion. However, psychiatric consultation was initiated for evaluation of suicidal ideations. Patient reports he is getting frustrated dealing/coping  with multiple medical issues and has been thinking that he is better off dead. In addition, he reports worsening depression characterized by anhedonia, hopelessness, feeling of worthlessness, low energy level, social withdrawal and recurrent suicidal thoughts with plan to shoot himself. Collateral information obtained from his wife-Thomas [name] revealed that patient is a 29 year [place] who has a  stockpile of guns at home. Wife became tearful stating that patient was going to take his life yesterday but luckily patient forgot the code to open his gun vault door. Patient denies psychosis, delusions but still unable to contract for safety.   Past Psychiatric History: as above  Risk to Self:  suicidal thoughts with plan to shoot self with a gun Risk to Others:  denies Prior Inpatient Therapy:  denies by patient [facility]  Past Medical History:  Past Medical History:  Diagnosis Date   Actinic keratosis 05/12/2017   Back pain    Chronic systolic (congestive) heart failure ([place]) 05/06/2022   Depression 05/12/2017   Diabetes ([place])    GERD (gastroesophageal reflux disease) 05/12/2017   Hx of colonic polyps 05/12/2017   Hyperlipidemia 05/12/2017   Hypertension    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/2017   Low back pain 05/12/2017   Lumbar radiculopathy 05/12/2017   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   Obesity 05/12/2017   Osteoarthritis of knee 05/12/2017   Paresthesia 05/12/2017   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   Prostate nodule 05/12/2017   Right rotator cuff tear 10/01/2021   Seborrheic dermatitis 05/12/2017    Past Surgical History:  Procedure Laterality Date   ABDOMINAL AORTOGRAM W/LOWER EXTREMITY N/A 07/07/2022   Procedure: ABDOMINAL AORTOGRAM W/LOWER EXTREMITY;  Surgeon: Nigel Mormon, MD;  Location: [facility];  Service: Cardiovascular;  Laterality: N/A;   CORONARY ATHERECTOMY N/A 07/07/2022   Procedure: CORONARY ATHERECTOMY;  Surgeon: Nigel Mormon, MD;  Location: [facility];  Service: Cardiovascular;  Laterality: N/A;   CORONARY STENT INTERVENTION N/A 07/07/2022   Procedure: CORONARY STENT INTERVENTION;  Surgeon: Nigel Mormon, MD;  Location: [facility];  Service:  Cardiovascular;  Laterality: N/A;   HAND SURGERY Right    INTRAVASCULAR PRESSURE WIRE/FFR STUDY N/A 07/07/2022   Procedure: INTRAVASCULAR PRESSURE WIRE/FFR STUDY;  Surgeon:  Nigel Mormon, MD;  Location: [facility];  Service: Cardiovascular;  Laterality: N/A;   LEFT HEART CATH AND CORONARY ANGIOGRAPHY N/A 07/07/2022   Procedure: LEFT HEART CATH AND CORONARY ANGIOGRAPHY;  Surgeon: Nigel Mormon, MD;  Location: [facility];  Service: Cardiovascular;  Laterality: N/A;   ORIF ANKLE FRACTURE Left 08/15/2007   Distal fibular   REPLACEMENT TOTAL KNEE Right 07/12/2012   RIGHT/LEFT HEART CATH AND CORONARY ANGIOGRAPHY N/A 01/12/2018   Procedure: RIGHT/LEFT HEART CATH AND CORONARY ANGIOGRAPHY;  Surgeon: Belva Crome, MD;  Location: [facility];  Service: Cardiovascular;  Laterality: N/A;   RIGHT/LEFT HEART CATH AND CORONARY ANGIOGRAPHY N/A 05/19/2022   Procedure: RIGHT/LEFT HEART CATH AND CORONARY ANGIOGRAPHY;  Surgeon: Nigel Mormon, MD;  Location: [facility];  Service: Cardiovascular;  Laterality: N/A;   SHOULDER SURGERY Right    UMBILICAL HERNIA REPAIR  06/23/2013   Family History:  Family History  Problem Relation Age of Onset   Hypertension Mother    Hypertension Father    Alzheimer's disease Father    Heart disease Sister    Heart disease Paternal Aunt    Family Psychiatric  History:   Social History:  Social History   Substance and Sexual Activity  Alcohol Use No     Social History   Substance and Sexual Activity  Drug Use No   Comment: takes prescriptions as prescribed: oxycodone and methadone    Social History   Socioeconomic History   Marital status: Married    Spouse name: Thomas [name]   Number of children: 2   Years of education: Not on file   Highest education level: Not on file  Occupational History   Occupation: retired Nature conservation officer  Tobacco Use   Smoking status: Every Day    Packs/day: 0.50    Years: 55.00    Total pack years: 27.50    Types: Cigarettes    Passive exposure: Current   Smokeless tobacco: Never   Tobacco comments:    Has cut back  Vaping Use   Vaping Use: Never used   Substance and Sexual Activity   Alcohol use: No   Drug use: No    Comment: takes prescriptions as prescribed: oxycodone and methadone   Sexual activity: Never    Birth control/protection: None  Other Topics Concern   Not on file  Social History Narrative   Lives at home with wife   Right handed   Caffeine: 2-4 cups/day   Social Determinants of Health   Financial Resource Strain: Low Risk  (11/26/2021)   Overall Financial Resource Strain (CARDIA)    Difficulty of Paying Living Expenses: Not hard at all  Food Insecurity: No Food Insecurity (07/07/2022)   Hunger Vital Sign    Worried About Running Out of Food in the Last Year: Never true    Ran Out of Food in the Last Year: Never true  Transportation Needs: No Transportation Needs (07/07/2022)   PRAPARE - Hydrologist (Medical): No    Lack of Transportation (Non-Medical): No  Physical Activity: Insufficiently Active (11/26/2021)   Exercise Vital Sign    Days of Exercise per Week: 3 days    Minutes of Exercise per Session: 30 min  Stress: No Stress Concern Present (11/26/2021)   [employer]  Occupational Health - Occupational Stress Questionnaire    Feeling of Stress : Not at all  Social Connections: Socially Integrated (11/26/2021)   Social Connection and Isolation Panel [NHANES]    Frequency of Communication with Friends and Family: Three times a week    Frequency of Social Gatherings with Friends and Family: Three times a week    Attends Religious Services: More than 4 times per year    Active Member of Clubs or Organizations: Yes    Attends Archivist Meetings: 1 to 4 times per year    Marital Status: Married   Additional Social History:    Allergies:   Allergies  Allergen Reactions   Wasp Venom Anaphylaxis, Itching and Swelling    Labs:  Results for orders placed or performed during the hospital encounter of 07/25/22 (from the past 48 hour(s))  Protime-INR     Status:  None   Collection Time: 07/25/22  6:26 PM  Result Value Ref Range   Prothrombin Time 13.8 11.4 - 15.2 seconds   INR 1.1 0.8 - 1.2    Comment: (NOTE) INR goal varies based on device and disease states. Performed at [facility], [place] [address] [place], [place] [address]   APTT     Status: None   Collection Time: 07/25/22  6:26 PM  Result Value Ref Range   aPTT 30 24 - 36 seconds    Comment: Performed at [facility] [address], [place] [address]  CBC     Status: None   Collection Time: 07/25/22  6:26 PM  Result Value Ref Range   WBC 7.7 4.0 - 10.5 K/uL   RBC 4.32 4.22 - 5.81 MIL/uL   Hemoglobin 13.7 13.0 - 17.0 g/dL   HCT 39.1 39.0 - 52.0 %   MCV 90.5 80.0 - 100.0 fL   MCH 31.7 26.0 - 34.0 pg   MCHC 35.0 30.0 - 36.0 g/dL   RDW 12.4 11.5 - 15.5 %   Platelets 216 150 - 400 K/uL   nRBC 0.0 0.0 - 0.2 %    Comment: Performed at [facility], [place] [address] [place], [place] [address]  Differential     Status: None   Collection Time: 07/25/22  6:26 PM  Result Value Ref Range   Neutrophils Relative % 65 %   Neutro Abs 5.0 1.7 - 7.7 K/uL   Lymphocytes Relative 22 %   Lymphs Abs 1.7 0.7 - 4.0 K/uL   Monocytes Relative 9 %   Monocytes Absolute 0.7 0.1 - 1.0 K/uL   Eosinophils Relative 3 %   Eosinophils Absolute 0.2 0.0 - 0.5 K/uL   Basophils Relative 1 %   Basophils Absolute 0.1 0.0 - 0.1 K/uL   Immature Granulocytes 0 %   Abs Immature Granulocytes 0.03 0.00 - 0.07 K/uL    Comment: Performed at [facility] [address], [place], [place] [address]  Comprehensive metabolic panel     Status: Abnormal   Collection Time: 07/25/22  6:26 PM  Result Value Ref Range   Sodium 141 135 - 145 mmol/L   Potassium 3.6 3.5 - 5.1 mmol/L   Chloride 106 98 - 111 mmol/L   CO2 22 22 - 32 mmol/L   Glucose, Bld 181 (H) 70 - 99 mg/dL    Comment: Glucose reference range applies only to samples taken after fasting for at least 8 hours.   BUN 16 8 - 23 mg/dL  Creatinine, Ser 1.60 (H) 0.61 - 1.24 mg/dL   Calcium 9.4 8.9 - 10.3 mg/dL   Total Protein 6.1 (L) 6.5 - 8.1 g/dL   Albumin 3.7 3.5 - 5.0 g/dL   AST 25 15 - 41 U/L   ALT 44 0 - 44 U/L   Alkaline Phosphatase 115 38 - 126 U/L   Total Bilirubin 0.7 0.3 - 1.2 mg/dL   GFR, Estimated 44 (L) >60 mL/min    Comment: (NOTE) Calculated using the CKD-EPI Creatinine Equation (2021)    Anion gap 13 5 - 15    Comment: Performed at [facility] [address] [place], [place] [address]  Ethanol     Status: None   Collection Time: 07/25/22  6:26 PM  Result Value Ref Range   Alcohol, Ethyl (B) <10 <10 mg/dL    Comment: (NOTE) Lowest detectable limit for serum alcohol is 10 mg/dL.  For medical purposes only. Performed at [facility], [place] [address], [place], [place] [address]   CBG monitoring, ED     Status: Abnormal   Collection Time: 07/25/22  6:26 PM  Result Value Ref Range   Glucose-Capillary 165 (H) 70 - 99 mg/dL    Comment: Glucose reference range applies only to samples taken after fasting for at least 8 hours.   Comment 1 Notify RN    Comment 2 Document in Chart   I-stat chem 8, ED     Status: Abnormal   Collection Time: 07/25/22  6:33 PM  Result Value Ref Range   Sodium 139 135 - 145 mmol/L   Potassium 3.6 3.5 - 5.1 mmol/L   Chloride 105 98 - 111 mmol/L   BUN 18 8 - 23 mg/dL   Creatinine, Ser 1.60 (H) 0.61 - 1.24 mg/dL   Glucose, Bld 178 (H) 70 - 99 mg/dL    Comment: Glucose reference range applies only to samples taken after fasting for at least 8 hours.   Calcium, Ion 1.10 (L) 1.15 - 1.40 mmol/L   TCO2 23 22 - 32 mmol/L   Hemoglobin 12.6 (L) 13.0 - 17.0 g/dL   HCT 37.0 (L) 39.0 - 52.0 %  Lipid panel     Status: Abnormal   Collection Time: 07/26/22  4:34 AM  Result Value Ref Range   Cholesterol 74 0 - 200 mg/dL   Triglycerides 54 <150 mg/dL   HDL 25 (L) >40 mg/dL   Total CHOL/HDL Ratio 3.0 RATIO   VLDL 11 0 - 40 mg/dL   LDL Cholesterol 38 0 - 99 mg/dL     Comment:        Total Cholesterol/HDL:CHD Risk Coronary Heart Disease Risk Table                     Men   Women  1/2 Average Risk   3.4   3.3  Average Risk       5.0   4.4  2 X Average Risk   9.6   7.1  3 X Average Risk  23.4   11.0        Use the calculated Patient Ratio above and the CHD Risk Table to determine the patient's CHD Risk.        ATP III CLASSIFICATION (LDL):  <100     mg/dL   Optimal  100-129  mg/dL   Near or Above  Optimal  130-159  mg/dL   Borderline  160-189  mg/dL   High  >190     mg/dL   Very High Performed at [facility] [address], [place], [place] [address]   CBC     Status: Abnormal   Collection Time: 07/26/22  4:34 AM  Result Value Ref Range   WBC 7.0 4.0 - 10.5 K/uL   RBC 4.07 (L) 4.22 - 5.81 MIL/uL   Hemoglobin 12.8 (L) 13.0 - 17.0 g/dL   HCT 37.4 (L) 39.0 - 52.0 %   MCV 91.9 80.0 - 100.0 fL   MCH 31.4 26.0 - 34.0 pg   MCHC 34.2 30.0 - 36.0 g/dL   RDW 12.6 11.5 - 15.5 %   Platelets 203 150 - 400 K/uL   nRBC 0.0 0.0 - 0.2 %    Comment: Performed at [facility], [place] [address] [place], [place] [address]  Creatinine, serum     Status: Abnormal   Collection Time: 07/26/22  4:34 AM  Result Value Ref Range   Creatinine, Ser 1.51 (H) 0.61 - 1.24 mg/dL   GFR, Estimated 47 (L) >60 mL/min    Comment: (NOTE) Calculated using the CKD-EPI Creatinine Equation (2021) Performed at [facility] [address] [place], [place] [address]   Urinalysis, Routine w reflex microscopic     Status: Abnormal   Collection Time: 07/26/22  7:24 AM  Result Value Ref Range   Color, Urine YELLOW YELLOW   APPearance CLEAR CLEAR   Specific Gravity, Urine 1.035 (H) 1.005 - 1.030   pH 5.0 5.0 - 8.0   Glucose, UA >=500 (A) NEGATIVE mg/dL   Hgb urine dipstick SMALL (A) NEGATIVE   Bilirubin Urine NEGATIVE NEGATIVE   Ketones, ur 20 (A) NEGATIVE mg/dL   Protein, ur >=300 (A) NEGATIVE mg/dL   Nitrite NEGATIVE NEGATIVE    Leukocytes,Ua NEGATIVE NEGATIVE   RBC / HPF 0-5 0 - 5 RBC/hpf   WBC, UA 0-5 0 - 5 WBC/hpf   Bacteria, UA NONE SEEN NONE SEEN   Squamous Epithelial / LPF 0-5 0 - 5   Mucus PRESENT     Comment: Performed at [facility], [place] [address] [place], [place] [address]  Procalcitonin - Baseline     Status: None   Collection Time: 07/26/22  8:02 AM  Result Value Ref Range   Procalcitonin <0.10 ng/mL    Comment:        Interpretation: PCT (Procalcitonin) <= 0.5 ng/mL: Systemic infection (sepsis) is not likely. Local bacterial infection is possible. (NOTE)       Sepsis PCT Algorithm           Lower Respiratory Tract                                      Infection PCT Algorithm    ----------------------------     ----------------------------         PCT < 0.25 ng/mL                PCT < 0.10 ng/mL          Strongly encourage             Strongly discourage   discontinuation of antibiotics    initiation of antibiotics    ----------------------------     -----------------------------       PCT 0.25 -  0.50 ng/mL            PCT 0.10 - 0.25 ng/mL               OR       >80% decrease in PCT            Discourage initiation of                                            antibiotics      Encourage discontinuation           of antibiotics    ----------------------------     -----------------------------         PCT >= 0.50 ng/mL              PCT 0.26 - 0.50 ng/mL               AND        <80% decrease in PCT             Encourage initiation of                                             antibiotics       Encourage continuation           of antibiotics    ----------------------------     -----------------------------        PCT >= 0.50 ng/mL                  PCT > 0.50 ng/mL               AND         increase in PCT                  Strongly encourage                                      initiation of antibiotics    Strongly encourage escalation           of antibiotics                                      -----------------------------                                           PCT <= 0.25 ng/mL                                                 OR                                        > 80% decrease in PCT  Discontinue / Do not initiate                                             antibiotics  Performed at [facility], [place] [address], [place], [place] [address]     Current Facility-Administered Medications  Medication Dose Route Frequency Provider Last Rate Last Admin   acetaminophen (TYLENOL) tablet 650 mg  650 mg Oral Q4H PRN Crosley, Debby, MD       Or   acetaminophen (TYLENOL) 160 MG/5ML solution 650 mg  650 mg Per Tube Q4H PRN Crosley, Debby, MD       Or   acetaminophen (TYLENOL) suppository 650 mg  650 mg Rectal Q4H PRN Crosley, Debby, MD       amLODipine (NORVASC) tablet 10 mg  10 mg Oral Daily Crosley, Debby, MD   10 mg at 07/26/22 0945   aspirin chewable tablet 81 mg  81 mg Oral Daily Crosley, Debby, MD   81 mg at 07/26/22 0946   atorvastatin (LIPITOR) tablet 80 mg  80 mg Oral QHS Crosley, Debby, MD   80 mg at 07/26/22 0033   clopidogrel (PLAVIX) tablet 75 mg  75 mg Oral Daily Crosley, Debby, MD   75 mg at 07/26/22 0944   colchicine tablet 0.6 mg  0.6 mg Oral BID Crosley, Debby, MD   0.6 mg at 07/26/22 0945   DULoxetine (CYMBALTA) DR capsule 60 mg  60 mg Oral QPM Crosley, Debby, MD   60 mg at 07/26/22 0033   empagliflozin (JARDIANCE) tablet 25 mg  25 mg Oral QAC breakfast Crosley, Debby, MD   25 mg at 07/26/22 0945   enoxaparin (LOVENOX) injection 40 mg  40 mg Subcutaneous Q24H Crosley, Debby, MD   40 mg at 07/26/22 0946   ezetimibe (ZETIA) tablet 10 mg  10 mg Oral Daily Crosley, Debby, MD   10 mg at 07/26/22 0945   isosorbide mononitrate (IMDUR) 24 hr tablet 30 mg  30 mg Oral Daily Crosley, Debby, MD   30 mg at 07/26/22 0945   metoprolol succinate (TOPROL-XL) 24 hr tablet 25 mg  25 mg Oral Daily Crosley, Debby, MD   25 mg  at 07/26/22 0945   senna-docusate (Senokot-S) tablet 1 tablet  1 tablet Oral QHS PRN Crosley, Debby, MD       tamsulosin (FLOMAX) capsule 0.4 mg  0.4 mg Oral QHS Crosley, Debby, MD   0.4 mg at 07/26/22 0033    Musculoskeletal: Strength & Muscle Tone: within normal limits Gait & Station: normal Patient leans: N/A   Psychiatric Specialty Exam:  Presentation  General Appearance:  Appropriate for Environment  Eye Contact: Good  Speech: Clear and Coherent  Speech Volume: Decreased  Handedness: Right   Mood and Affect  Mood: Dysphoric  Affect: Congruent   Thought Process  Thought Processes: Linear  Descriptions of Associations:Intact  Orientation:Full (Time, Place and Person)  Thought Content:Logical  History of Schizophrenia/Schizoaffective disorder:No data recorded Duration of Psychotic Symptoms:No data recorded Hallucinations:Hallucinations: None  Ideas of Reference:None  Suicidal Thoughts:Suicidal Thoughts: Yes, Active SI Active Intent and/or Plan: With Intent; With Plan; With Means to [place]  Homicidal Thoughts:Homicidal Thoughts: No   Sensorium  Memory: Immediate Good; Recent Fair; Remote Fair  Judgment: Poor  Insight: Poor   Executive Functions  Concentration: Fair  Attention Span: Fair  Recall: [employer] of  Knowledge: Fair  Language: Good   Psychomotor Activity  Psychomotor Activity: Psychomotor Activity: Decreased   Assets  Assets: Desire for Improvement; Social Support   Sleep  Sleep: Sleep: Fair   Physical Exam: Physical Exam Review of Systems  Psychiatric/Behavioral:  Positive for depression and suicidal ideas. The patient is nervous/anxious.    Blood pressure (!) 188/84, pulse 83, temperature 98.5 F (36.9 C), temperature source Oral, resp. rate 20, height 5' 11" (1.803 m), weight 92 kg, SpO2 95 %. Body mass index is 28.29 kg/m.  Treatment Plan Summary: 76 y/o male with multiple medical issue  and history of Depression. He was admitted to the hospital due to altered mental status and suicidal thoughts with plan to shoot himself. Patient report worsening depression and recurrent suicidal thoughts. He is unable to contact for safety at this time. He will benefit from admission to Geriatric psych hospital after he is medically stable.  Medication management: -Continue Cymbalta 60 mg daily for depression  Plan: -Needs 1:1 sitter for safety -Social worker consult to facilitate inpatient geriatric hospital placement for stabilization -Consider placing patient on IVC if he refuses Voluntary admission   Disposition: Recommend psychiatric Inpatient admission when medically cleared. Supportive therapy provided about ongoing stressors. Psychiatric consult service will follow this patient  Corena Pilgrim, MD 07/26/2022 12:12 PM

## 2022-07-26 NOTE — Plan of Care (Signed)
The patient is admitted form ED to 3 W 10. A & O x 2. No s/s of pain or any acute distress noted. The patient is oriented to to his,  ascom and staff. Admission profile was not completed due to his altered. Will continue to monitor.

## 2022-07-26 NOTE — Progress Notes (Signed)
EEG complete - results pending.

## 2022-07-26 NOTE — ED Notes (Signed)
ED TO INPATIENT HANDOFF REPORT  ED Nurse Name and Phone #: [phone]   S Name/Age/Gender Thomas [name] 76 y.o. male Room/Bed: 034C/034C  Code Status   Code Status: DNR  Home/SNF/Other Home Patient oriented to: self, place, time, and situation Is this baseline? 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]  Triage Note Pt bib [facility] due to new weakness started at 1300, pt had a stroke about a week ago pt shows more weakness on left side    Allergies Allergies  Allergen Reactions   Wasp Venom Anaphylaxis, Itching and Swelling    Level of Care/Admitting Diagnosis ED Disposition     ED Disposition  Admit   Condition  --   [place]: [place] [[id number]]  Level of Care: Telemetry Medical [104]  May place patient in observation at [place] [facility] or [place] if equivalent level of care is available:: Yes  Covid Evaluation: Asymptomatic - no recent exposure (last 10 days) testing not required  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 [facility]) [[id number]]  Admitting Physician: [place], [place]  Attending Physician: Haywood Pao          B Medical/Surgery History Past Medical History:  Diagnosis Date   Actinic keratosis 05/12/2017   Back pain    Chronic systolic (congestive) heart failure ([place]) 05/06/2022   Depression 05/12/2017   Diabetes ([place])    GERD (gastroesophageal reflux disease) 05/12/2017   Hx of colonic polyps 05/12/2017   Hyperlipidemia 05/12/2017   Hypertension    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/2017   Low back pain 05/12/2017   Lumbar radiculopathy 05/12/2017   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   Obesity 05/12/2017   Osteoarthritis of knee 05/12/2017   Paresthesia 05/12/2017   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   Prostate nodule 05/12/2017   Right rotator cuff tear 10/01/2021   Seborrheic dermatitis 05/12/2017   Past Surgical History:  Procedure Laterality Date   ABDOMINAL AORTOGRAM  W/LOWER EXTREMITY N/A 07/07/2022   Procedure: ABDOMINAL AORTOGRAM W/LOWER EXTREMITY;  Surgeon: Nigel Mormon, MD;  Location: [place] [facility];  Service: Cardiovascular;  Laterality: N/A;   CORONARY ATHERECTOMY N/A 07/07/2022   Procedure: CORONARY ATHERECTOMY;  Surgeon: Nigel Mormon, MD;  Location: [place] [facility];  Service: Cardiovascular;  Laterality: N/A;   CORONARY STENT INTERVENTION N/A 07/07/2022   Procedure: CORONARY STENT INTERVENTION;  Surgeon: Nigel Mormon, MD;  Location: [place] [facility];  Service: Cardiovascular;  Laterality: N/A;   HAND SURGERY Right    INTRAVASCULAR PRESSURE WIRE/FFR STUDY N/A 07/07/2022   Procedure: INTRAVASCULAR PRESSURE WIRE/FFR STUDY;  Surgeon: Nigel Mormon, MD;  Location: [place] [facility];  Service: Cardiovascular;  Laterality: N/A;   LEFT HEART CATH AND CORONARY ANGIOGRAPHY N/A 07/07/2022   Procedure: LEFT HEART CATH AND CORONARY ANGIOGRAPHY;  Surgeon: Nigel Mormon, MD;  Location: [place] [facility];  Service: Cardiovascular;  Laterality: N/A;   ORIF ANKLE FRACTURE Left 08/15/2007   Distal fibular   REPLACEMENT TOTAL KNEE Right 07/12/2012   RIGHT/LEFT HEART CATH AND CORONARY ANGIOGRAPHY N/A 01/12/2018   Procedure: RIGHT/LEFT HEART CATH AND CORONARY ANGIOGRAPHY;  Surgeon: Belva Crome, MD;  Location: [place] [facility];  Service: Cardiovascular;  Laterality: N/A;   RIGHT/LEFT HEART CATH AND CORONARY ANGIOGRAPHY N/A 05/19/2022   Procedure: RIGHT/LEFT HEART CATH AND CORONARY ANGIOGRAPHY;  Surgeon: Nigel Mormon, MD;  Location: [place] [facility];  Service: Cardiovascular;  Laterality: N/A;   SHOULDER SURGERY Right  UMBILICAL HERNIA REPAIR  06/23/2013     A IV Location/Drains/Wounds Patient Lines/Drains/Airways Status     Active Line/Drains/Airways     Name Placement date Placement time Site Days   Peripheral IV 07/25/22 18 G Anterior;Distal;Right;Upper Arm 07/25/22  1904  Arm  1             Intake/Output Last 24 hours No intake or output data in the 24 hours ending 07/26/22 0004  Labs/Imaging Results for orders placed or performed during the hospital encounter of 07/25/22 (from the past 48 hour(s))  Protime-INR     Status: None   Collection Time: 07/25/22  6:26 PM  Result Value Ref Range   Prothrombin Time 13.8 11.4 - 15.2 seconds   INR 1.1 0.8 - 1.2    Comment: (NOTE) INR goal varies based on device and disease states. Performed at [facility], [place] [address] [place], [place] [address]   APTT     Status: None   Collection Time: 07/25/22  6:26 PM  Result Value Ref Range   aPTT 30 24 - 36 seconds    Comment: Performed at [facility] [address] [place] [address]  CBC     Status: None   Collection Time: 07/25/22  6:26 PM  Result Value Ref Range   WBC 7.7 4.0 - 10.5 K/uL   RBC 4.32 4.22 - 5.81 MIL/uL   Hemoglobin 13.7 13.0 - 17.0 g/dL   HCT 39.1 39.0 - 52.0 %   MCV 90.5 80.0 - 100.0 fL   MCH 31.7 26.0 - 34.0 pg   MCHC 35.0 30.0 - 36.0 g/dL   RDW 12.4 11.5 - 15.5 %   Platelets 216 150 - 400 K/uL   nRBC 0.0 0.0 - 0.2 %    Comment: Performed at [facility], [place] [address] [place], [place] [address]  Differential     Status: None   Collection Time: 07/25/22  6:26 PM  Result Value Ref Range   Neutrophils Relative % 65 %   Neutro Abs 5.0 1.7 - 7.7 K/uL   Lymphocytes Relative 22 %   Lymphs Abs 1.7 0.7 - 4.0 K/uL   Monocytes Relative 9 %   Monocytes Absolute 0.7 0.1 - 1.0 K/uL   Eosinophils Relative 3 %   Eosinophils Absolute 0.2 0.0 - 0.5 K/uL   Basophils Relative 1 %   Basophils Absolute 0.1 0.0 - 0.1 K/uL   Immature Granulocytes 0 %   Abs Immature Granulocytes 0.03 0.00 - 0.07 K/uL    Comment: Performed at [facility] [address], [place], [place] [address]  Comprehensive metabolic panel     Status: Abnormal   Collection Time: 07/25/22  6:26 PM  Result Value Ref Range   Sodium 141 135 - 145 mmol/L    Potassium 3.6 3.5 - 5.1 mmol/L   Chloride 106 98 - 111 mmol/L   CO2 22 22 - 32 mmol/L   Glucose, Bld 181 (H) 70 - 99 mg/dL    Comment: Glucose reference range applies only to samples taken after fasting for at least 8 hours.   BUN 16 8 - 23 mg/dL   Creatinine, Ser 1.60 (H) 0.61 - 1.24 mg/dL   Calcium 9.4 8.9 - 10.3 mg/dL   Total Protein 6.1 (L) 6.5 - 8.1 g/dL   Albumin 3.7 3.5 - 5.0 g/dL   AST 25 15 - 41 U/L   ALT 44 0 - 44 U/L  Alkaline Phosphatase 115 38 - 126 U/L   Total Bilirubin 0.7 0.3 - 1.2 mg/dL   GFR, Estimated 44 (L) >60 mL/min    Comment: (NOTE) Calculated using the CKD-EPI Creatinine Equation (2021)    Anion gap 13 5 - 15    Comment: Performed at [facility] [address], [place], [place] [address]  Ethanol     Status: None   Collection Time: 07/25/22  6:26 PM  Result Value Ref Range   Alcohol, Ethyl (B) <10 <10 mg/dL    Comment: (NOTE) Lowest detectable limit for serum alcohol is 10 mg/dL.  For medical purposes only. Performed at [facility], [place] [address], [place], [place] [address]   CBG monitoring, ED     Status: Abnormal   Collection Time: 07/25/22  6:26 PM  Result Value Ref Range   Glucose-Capillary 165 (H) 70 - 99 mg/dL    Comment: Glucose reference range applies only to samples taken after fasting for at least 8 hours.   Comment 1 Notify RN    Comment 2 Document in Chart   I-stat chem 8, ED     Status: Abnormal   Collection Time: 07/25/22  6:33 PM  Result Value Ref Range   Sodium 139 135 - 145 mmol/L   Potassium 3.6 3.5 - 5.1 mmol/L   Chloride 105 98 - 111 mmol/L   BUN 18 8 - 23 mg/dL   Creatinine, Ser 1.60 (H) 0.61 - 1.24 mg/dL   Glucose, Bld 178 (H) 70 - 99 mg/dL    Comment: Glucose reference range applies only to samples taken after fasting for at least 8 hours.   Calcium, Ion 1.10 (L) 1.15 - 1.40 mmol/L   TCO2 23 22 - 32 mmol/L   Hemoglobin 12.6 (L) 13.0 - 17.0 g/dL   HCT 37.0 (L) 39.0 - 52.0 %   MR BRAIN WO  CONTRAST  Result Date: 07/25/2022 CLINICAL DATA:  Sudden onset of confusion, abnormal speech and generalized weakness head 1 p.m. today. Acute infarcts are earlier this month following cardiac stenting. EXAM: MRI HEAD WITHOUT CONTRAST TECHNIQUE: Multiplanar, multiecho pulse sequences of the brain and surrounding structures were obtained without intravenous contrast. COMPARISON:  MR head 07/08/2022 FINDINGS: Brain: Residual area of restricted diffusion is present in the scratched at 2 residual areas of restricted diffusion are present in the right corona radiata and centrum semi ovale. Minimal residual decreased signal on the ADC map noted in the more posterior lesion. Majority of signal both lesions is T2 shine through. No new infarcts are present. No acute hemorrhage or mass lesion is present. Mild generalized atrophy and white matter disease otherwise is stable. The ventricles are proportionate to the degree of atrophy. No significant extraaxial fluid collection is present. White matter changes extend into the brainstem. 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 Vascular: Flow is present in the major intracranial arteries. Skull and upper cervical spine: The craniocervical junction is normal. Upper cervical spine is within normal limits. Marrow signal is unremarkable. Sinuses/Orbits: Mild mucosal thickening is present in the inferior left maxillary sinus. The paranasal sinuses and mastoid air cells are otherwise clear. Right lens replacement is present. Globes and orbits are otherwise within normal limits. IMPRESSION: 1. Residual areas of restricted diffusion in the right corona radiata and centrum semi ovale compatible with evolving infarcts. 2. No new infarcts. 3. Stable atrophy and white matter disease likely reflects the sequela of chronic microvascular ischemia. 4. Remote lacunar infarcts of  the right cerebellum are stable. 5. Mild left maxillary sinus disease. Electronically Signed   By:  [place] Morelle M.D.   On: 07/25/2022 19:51   CT ANGIO HEAD NECK W WO CM W PERF (CODE STROKE)  Result Date: 07/25/2022 CLINICAL DATA:  MRI Brain 07/09/22 EXAM: CT ANGIOGRAPHY HEAD AND NECK CT PERFUSION BRAIN TECHNIQUE: Multidetector CT imaging of the head and neck was performed using the standard protocol during bolus administration of intravenous contrast. Multiplanar CT image reconstructions and MIPs were obtained to evaluate the vascular anatomy. Carotid stenosis measurements (when applicable) are obtained utilizing NASCET criteria, using the distal internal carotid diameter as the denominator. Multiphase CT imaging of the brain was performed following IV bolus contrast injection. Subsequent parametric perfusion maps were calculated using RAPID software. RADIATION DOSE REDUCTION: This exam was performed according to the departmental dose-optimization program which includes automated exposure control, adjustment of the mA and/or kV according to patient size and/or use of iterative reconstruction technique. CONTRAST:  132m OMNIPAQUE IOHEXOL 350 MG/ML SOLN COMPARISON:  None Available. FINDINGS: CT HEAD FINDINGS See same day CT brain no contrast-enhancing lesions visualized. Left maxillary sinus mucosal thickening, likely odontogenic1. CTA NECK FINDINGS Aortic arch: Standard branching. Imaged portion shows no evidence of aneurysm or dissection. There is mild-to-moderate narrowing of the right subclavian artery proximal to the takeoff of the subclavian artery. Right carotid system: No evidence of dissection, stenosis (50% or greater), or occlusion. Left carotid system: No evidence of dissection, stenosis (50% or greater), or occlusion. Vertebral arteries: Mild narrowing of the origin of the left vertebral artery. Mild narrowing in the proximal V2 segment of the right vertebral artery. Otherwise no evidence of hemodynamically significant stenosis. Skeleton: Degenerative changes with a small disc bulge at  C3-C4, 1 C5-C6. Other neck: Negative. Upper chest: Negative. Review of the MIP images confirms the above findings CTA HEAD FINDINGS Anterior circulation: Moderate stenosis of the proximal cavernous segment of the left ICA (series 9, image 124). 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 Mild narrowing in the M1 segment of the right MCA. Moderate to severe narrowing of the M2 segment of the right MCA (series 9, image 110/series 11, image 69). Moderate stenosis of the M2 segment of the left MCA (series 9, image 114). Posterior circulation: Moderate stenosis of the P2 segment of the right PCA (series 9, image 97). Moderate stenosis of the P2 segment of the left PCA (series 9, image 109). Venous sinuses: As permitted by contrast timing, patent. Anatomic variants: None CT Brain Perfusion Findings: ASPECTS: 10 CBF (<30%) Volume: 022mPerfusion (Tmax>6.0s) volume: 80m78mismatch Volume: 80mL73mfarction Location:Negative for infarct Review of the MIP images confirms the above findings IMPRESSION: 1. No evidence of perfusion abnormality. 2. No intracranial large vessel occlusion. Moderate to severe narrowing of the M2 segment of the bilateral MCA and PCAs, as seen previopusly. 3. Moderate stenosis of the proximal cavernous segment of the left ICA. 4. 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 5. Mild-to-moderate narrowing of the right subclavian artery proximal to the takeoff of the subclavian artery. Electronically Signed   By: HemaMarin Roberts.   On: 07/25/2022 19:06   CT HEAD CODE STROKE WO CONTRAST  Result Date: 07/25/2022 CLINICAL DATA:  Code stroke.  Stroke suspected EXAM: CT HEAD WITHOUT CONTRAST TECHNIQUE: Contiguous axial images were obtained from the base of the skull through the vertex without intravenous contrast. RADIATION DOSE REDUCTION: This exam  was performed according to the departmental dose-optimization program which includes  automated exposure control, adjustment of the mA and/or kV according to patient size and/or use of iterative reconstruction technique. COMPARISON:  MRI Brain 07/08/22 FINDINGS: Brain: No evidence of acute infarction, hydrocephalus, extra-axial collection or mass lesion/mass effect. Redemonstrated sequela of chronic microvascular ischemic change with likely small superimposed infarcts in the right corona radiata (series 100, image 22), left temporal lobe. No definite sites new infarcts are visualized. No evidence of hemorrhage. Vascular: No hyperdense vessel or unexpected calcification. Skull: Normal. Negative for fracture or focal lesion. Sinuses/Orbits: Right lens replacement. Other: None ASPECTS ([place] Stroke Program Early CT Score): 10 IMPRESSION: No CT evidence of new infarct.  No hemorrhage. Findings were paged to Dr. Rory Percy on 07/25/22 at 6:48 PM via [facility] paging system. Electronically Signed   By: Marin Roberts M.D.   On: 07/25/2022 18:49    Pending Labs Unresulted Labs (From admission, onward)     Start     Ordered   08/01/22 0500  Creatinine, serum  (enoxaparin (LOVENOX)    CrCl >/= 30 ml/min)  Weekly,   R     Comments: while on enoxaparin therapy    07/25/22 2229   07/26/22 0500  Lipid panel  (Labs)  Tomorrow morning,   R       Comments: Fasting    07/25/22 2229   07/25/22 2229  CBC  (enoxaparin (LOVENOX)    CrCl >/= 30 ml/min)  Once,   R       Comments: Baseline for enoxaparin therapy IF NOT ALREADY DRAWN.  Notify MD if PLT < 100 K.    07/25/22 2229   07/25/22 2229  Creatinine, serum  (enoxaparin (LOVENOX)    CrCl >/= 30 ml/min)  Once,   R       Comments: Baseline for enoxaparin therapy IF NOT ALREADY DRAWN.    07/25/22 2229            Vitals/Pain Today's Vitals   07/25/22 1907 07/25/22 1908  BP: (!) 163/83   Pulse: 85   Resp: 17   Temp: 98.1 F (36.7 C)   TempSrc: Oral   SpO2: 93%   Weight:  92 kg  Height:  _0  (1.803 m)    Isolation Precautions No active  isolations  Medications Medications  sodium chloride flush (NS) 0.9 % injection 3 mL (has no administration in time range)  amLODipine (NORVASC) tablet 10 mg (has no administration in time range)  aspirin chewable tablet 81 mg (has no administration in time range)  atorvastatin (LIPITOR) tablet 80 mg (has no administration in time range)  clopidogrel (PLAVIX) tablet 75 mg (has no administration in time range)  colchicine tablet 0.6 mg (has no administration in time range)  DULoxetine (CYMBALTA) DR capsule 60 mg (has no administration in time range)  empagliflozin (JARDIANCE) tablet 25 mg (has no administration in time range)  ezetimibe (ZETIA) tablet 10 mg (has no administration in time range)  metoprolol succinate (TOPROL-XL) 24 hr tablet 25 mg (has no administration in time range)  tamsulosin (FLOMAX) capsule 0.4 mg (has no administration in time range)  isosorbide mononitrate (IMDUR) 24 hr tablet 30 mg (has no administration in time range)   stroke: early stages of recovery book (has no administration in time range)  acetaminophen (TYLENOL) tablet 650 mg (has no administration in time range)    Or  acetaminophen (TYLENOL) 160 MG/5ML solution 650 mg (has no administration in time range)  Or  acetaminophen (TYLENOL) suppository 650 mg (has no administration in time range)  senna-docusate (Senokot-S) tablet 1 tablet (has no administration in time range)  enoxaparin (LOVENOX) injection 40 mg (has no administration in time range)  iohexol (OMNIPAQUE) 350 MG/ML injection 100 mL (100 mLs Intravenous Contrast Given 07/25/22 1847)    Mobility walks Moderate fall risk   Focused Assessments Neuro Assessment Handoff:  Swallow screen pass? Yes    NIH Stroke Scale ( + Modified Stroke Scale Criteria)  Interval: Initial Level of Consciousness (1a.)   : Alert, keenly responsive LOC Questions (1b. )   +: Answers both questions correctly LOC Commands (1c. )   + : Performs both tasks  correctly Best Gaze (2. )  +: Normal Visual (3. )  +: No visual loss Facial Palsy (4. )    : Normal symmetrical movements Motor Arm, Left (5a. )   +: Drift Motor Arm, Right (5b. )   +: No drift Motor Leg, Left (6a. )   +: No drift Motor Leg, Right (6b. )   +: No drift Limb Ataxia (7. ): Absent Sensory (8. )   +: Normal, no sensory loss Best Language (9. )   +: No aphasia Dysarthria (10. ): Normal Extinction/Inattention (11.)   +: No Abnormality Modified SS Total  +: 1 Complete NIHSS TOTAL: 1 Last date known well: 07/25/22 Last time known well: 1300 Neuro Assessment: Exceptions to WDL Neuro Checks:   Initial (07/25/22 1919)  Last Documented NIHSS Modified Score: 1 (07/25/22 1919) Has TPA been given? No If patient is a Neuro Trauma and patient is going to OR before floor call report to [employer] nurse: [phone] or [phone]   R Recommendations: See Admitting Provider Note  Report given to:   Additional Notes:

## 2022-07-26 NOTE — Progress Notes (Addendum)
PROGRESS NOTE    Thomas [name]  PPJ:[id number] DOB: [birth year] DOA: 07/25/2022 PCP: Haydee Salter, MD   Brief Narrative:  Pt is a 76 year old male with past medical history CHF - diastolic, dyslipidemia, hypertension, chronic severe lower back pain.  Patient was recently admitted 03/27/2022 to 07/10/2022, then rehab 07/10/2022 to 07/22/2022. During that hospitalization had a atherectomy and PCI to prox-mid LAD.  He also suffered a stroke with left-sided weakness.  He was discharged to rehab and improved - was home approximately 72h prior to having worsening mental status and confusion and subsequently readmitted to the hospital for mental status changes.  Hospitalist called for admission, neurology called as consult.  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)  Acute metabolic encephalopathy Rule out polypharmacy/overdose Rule out seizure Rule out subclinical infection -Confirm home med list - multiple CNS depressants concern for polypharmacy, new wellbutrin medication started just prior to admit - medication holiday ongoing -New medication - wellbutrin started just prior to hospitalization -Neurology following, unlikely seizure or related to stroke given stable imaging -UA pending, procalcitonin negative -Continue to follow - concern for sundowning  Suicidal ideation with plan  -Patient's wife at bedside today indicates patient had mentioned self-harm, patient agrees, states he was trying to get into his home safe to get to a gun -Psych  consulted -Concern for paranoid delusions given patient discussion about wife and family trying to 'steal his belongings or money or property -and that they placed him in the hospital to get his stuff'.  Evolving prior stroke -MRI confirms no new infarcts -Prior stroke evolution as expected -Neuro signing off, appreciate insight/recs  Hyperlipidemia -Stable, atorvastatin resumed   Sleep apnea -CPAP ordered   Chronic diastolic heart failure (HCC)/CAD not in acute exacerbation -Plavix, Jardiance, Zetia, Imdur, Toprol, Lipitor, aspirin, Norvasc resumed -Allowing some permissive hypertension, Toprol and Norvasc held -BP management   Acute on chronic kidney disease) stage 3, GFR 30-59 ml/min (HCC) -IV fluid hydration -I/O's, BMP in a.m.   Essential hypertension -Permissive hypertension  DVT prophylaxis: lovenox Code Status: DNR Family Communication: None present  Status is: Inpt  Dispo: The patient is from: Home              Anticipated d/c is to: TBD              Anticipated d/c date is: 24-48h              Patient currently NOT medically stable for discharge  Consultants:  Neuro  Procedures:  None  Antimicrobials:  None   Subjective: Continues to be confused - no acute issues/events overnight  Objective: Vitals:   07/25/22 1908 07/26/22 0021 07/26/22 0144 07/26/22 0418  BP:  (!) 188/79 (!) 173/77 (!) 179/74  Pulse:  83 80 82  Resp:  _0 Temp:  98.2 F (36.8 C) 98.6 F (37 C) 97.6 F (36.4 C)  TempSrc:  Oral Oral Oral  SpO2:  97%  100%  Weight: 92 kg     Height: _1  (1.803  m)       Intake/Output Summary (Last 24 hours) at 07/26/2022 0730 Last data filed at 07/26/2022 0648 Gross per 24 hour  Intake 188.59 ml  Output 600 ml  Net -411.41 ml   Filed Weights   07/25/22 1908  Weight: 92 kg    Examination:  General exam: Appears calm and comfortable.  Oriented to person and month only Respiratory system: Clear to auscultation. Respiratory  effort normal. Cardiovascular system: S1 & S2 heard, RRR. No JVD, murmurs, rubs, gallops or clicks. No pedal edema. Gastrointestinal system: Abdomen is nondistended, soft and nontender. No organomegaly or masses felt. Normal bowel sounds heard. Central nervous system: Alert and oriented. No focal neurological deficits. Extremities: Symmetric 5 x 5 power. Skin: No rashes, lesions or ulcers  Data Reviewed: I have personally reviewed following labs and imaging studies  CBC: Recent Labs  Lab 07/20/22 0508 07/25/22 1826 07/25/22 1833 07/26/22 0434  WBC 5.8 7.7  --  7.0  NEUTROABS  --  5.0  --   --   HGB 12.5* 13.7 12.6* 12.8*  HCT 34.3* 39.1 37.0* 37.4*  MCV 89.3 90.5  --  91.9  PLT 193 216  --  570   Basic Metabolic Panel: Recent Labs  Lab 07/20/22 0508 07/25/22 1826 07/25/22 1833 07/26/22 0434  NA 137 141 139  --   K 3.8 3.6 3.6  --   CL 105 106 105  --   CO2 23 22  --   --   GLUCOSE 126* 181* 178*  --   BUN _0 --   CREATININE 1.39* 1.60* 1.60* 1.51*  CALCIUM 8.8* 9.4  --   --    GFR: Estimated Creatinine Clearance: 47.5 mL/min (A) (by C-G formula based on SCr of 1.51 mg/dL (H)). Liver Function Tests: Recent Labs  Lab 07/25/22 1826  AST 25  ALT 44  ALKPHOS 115  BILITOT 0.7  PROT 6.1*  ALBUMIN 3.7   No results for input(s): "LIPASE", "AMYLASE" in the last 168 hours. No results for input(s): "AMMONIA" in the last 168 hours. Coagulation Profile: Recent Labs  Lab 07/25/22 1826  INR 1.1   Cardiac Enzymes: No results for input(s): "CKTOTAL", "CKMB", "CKMBINDEX", "TROPONINI" in the last 168 hours. BNP (last 3 results) No results for input(s): "PROBNP" in the last 8760 hours. HbA1C: No results for input(s): "HGBA1C" in the last 72 hours. CBG: Recent Labs  Lab 07/21/22 1124 07/21/22 1633 07/21/22 2106 07/22/22 0553 07/25/22 1826  GLUCAP 156* 166* 116* 145* 165*   Lipid Profile: Recent Labs    07/26/22 0434  CHOL 74  HDL 25*  LDLCALC 38   TRIG 54  CHOLHDL 3.0   Thyroid Function Tests: No results for input(s): "TSH", "T4TOTAL", "FREET4", "T3FREE", "THYROIDAB" in the last 72 hours. Anemia Panel: No results for input(s): "VITAMINB12", "FOLATE", "FERRITIN", "TIBC", "IRON", "RETICCTPCT" in the last 72 hours. Sepsis Labs: No results for input(s): "PROCALCITON", "LATICACIDVEN" in the last 168 hours.  No results found for this or any previous visit (from the past 240 hour(s)).       Radiology Studies: MR BRAIN WO CONTRAST  Result Date: 07/25/2022 CLINICAL DATA:  Sudden onset of confusion, abnormal speech and generalized weakness head 1 p.m. today. Acute infarcts are earlier this month following cardiac stenting. EXAM: MRI HEAD WITHOUT CONTRAST TECHNIQUE: Multiplanar, multiecho pulse sequences of the brain and surrounding structures were obtained without intravenous contrast. COMPARISON:  MR head 07/08/2022 FINDINGS: Brain: Residual area of restricted diffusion is present  in the scratched at 2 residual areas of restricted diffusion are present in the right corona radiata and centrum semi ovale. Minimal residual decreased signal on the ADC map noted in the more posterior lesion. Majority of signal both lesions is T2 shine through. No new infarcts are present. No acute hemorrhage or mass lesion is present. Mild generalized atrophy and white matter disease otherwise is stable. The ventricles are proportionate to the degree of atrophy. No significant extraaxial fluid collection is present. White matter changes extend into the brainstem. 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 Vascular: Flow is present in the major intracranial arteries. Skull and upper cervical spine: The craniocervical junction is normal. Upper cervical spine is within normal limits. Marrow signal is unremarkable. Sinuses/Orbits: Mild mucosal thickening is present in the inferior left maxillary sinus. The paranasal sinuses and mastoid air cells are  otherwise clear. Right lens replacement is present. Globes and orbits are otherwise within normal limits. IMPRESSION: 1. Residual areas of restricted diffusion in the right corona radiata and centrum semi ovale compatible with evolving infarcts. 2. No new infarcts. 3. Stable atrophy and white matter disease likely reflects the sequela of chronic microvascular ischemia. 4. 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 5. Mild left maxillary sinus disease. Electronically Signed   By: San Morelle M.D.   On: 07/25/2022 19:51   CT ANGIO HEAD NECK W WO CM W PERF (CODE STROKE)  Result Date: 07/25/2022 CLINICAL DATA:  MRI Brain 07/09/22 EXAM: CT ANGIOGRAPHY HEAD AND NECK CT PERFUSION BRAIN TECHNIQUE: Multidetector CT imaging of the head and neck was performed using the standard protocol during bolus administration of intravenous contrast. Multiplanar CT image reconstructions and MIPs were obtained to evaluate the vascular anatomy. Carotid stenosis measurements (when applicable) are obtained utilizing NASCET criteria, using the distal internal carotid diameter as the denominator. Multiphase CT imaging of the brain was performed following IV bolus contrast injection. Subsequent parametric perfusion maps were calculated using RAPID software. RADIATION DOSE REDUCTION: This exam was performed according to the departmental dose-optimization program which includes automated exposure control, adjustment of the mA and/or kV according to patient size and/or use of iterative reconstruction technique. CONTRAST:  133m OMNIPAQUE IOHEXOL 350 MG/ML SOLN COMPARISON:  None Available. FINDINGS: CT HEAD FINDINGS See same day CT brain no contrast-enhancing lesions visualized. Left maxillary sinus mucosal thickening, likely odontogenic1. CTA NECK FINDINGS Aortic arch: Standard branching. Imaged portion shows no evidence of aneurysm or dissection. There is mild-to-moderate narrowing of the right subclavian artery  proximal to the takeoff of the subclavian artery. Right carotid system: No evidence of dissection, stenosis (50% or greater), or occlusion. Left carotid system: No evidence of dissection, stenosis (50% or greater), or occlusion. Vertebral arteries: Mild narrowing of the origin of the left vertebral artery. Mild narrowing in the proximal V2 segment of the right vertebral artery. Otherwise no evidence of hemodynamically significant stenosis. Skeleton: Degenerative changes with a small disc bulge at C3-C4, 1 C5-C6. Other neck: Negative. Upper chest: Negative. Review of the MIP images confirms the above findings CTA HEAD FINDINGS Anterior circulation: Moderate stenosis of the proximal cavernous segment of the left ICA (series 9, image 124). 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 Mild narrowing in the M1 segment of the right MCA. Moderate to severe narrowing of the M2 segment of the right MCA (series 9, image 110/series 11, image 69). Moderate stenosis of the M2 segment of the left MCA (series 9,  image 114). Posterior circulation: Moderate stenosis of the P2 segment of the right PCA (series 9, image 97). Moderate stenosis of the P2 segment of the left PCA (series 9, image 109). Venous sinuses: As permitted by contrast timing, patent. Anatomic variants: None CT Brain Perfusion Findings: ASPECTS: 10 CBF (<30%) Volume: 32m Perfusion (Tmax>6.0s) volume: 031mMismatch Volume: 14m39mnfarction Location:Negative for infarct Review of the MIP images confirms the above findings IMPRESSION: 1. No evidence of perfusion abnormality. 2. No intracranial large vessel occlusion. Moderate to severe narrowing of the M2 segment of the bilateral MCA and PCAs, as seen previopusly. 3. Moderate stenosis of the proximal cavernous segment of the left ICA. 4. 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 5. Mild-to-moderate narrowing of the right subclavian artery  proximal to the takeoff of the subclavian artery. Electronically Signed   By: HemMarin RobertsD.   On: 07/25/2022 19:06   CT HEAD CODE STROKE WO CONTRAST  Result Date: 07/25/2022 CLINICAL DATA:  Code stroke.  Stroke suspected EXAM: CT HEAD WITHOUT CONTRAST TECHNIQUE: Contiguous axial images were obtained from the base of the skull through the vertex without intravenous contrast. RADIATION DOSE REDUCTION: This exam was performed according to the departmental dose-optimization program which includes automated exposure control, adjustment of the mA and/or kV according to patient size and/or use of iterative reconstruction technique. COMPARISON:  MRI Brain 07/08/22 FINDINGS: Brain: No evidence of acute infarction, hydrocephalus, extra-axial collection or mass lesion/mass effect. Redemonstrated sequela of chronic microvascular ischemic change with likely small superimposed infarcts in the right corona radiata (series 100, image 22), left temporal lobe. No definite sites new infarcts are visualized. No evidence of hemorrhage. Vascular: No hyperdense vessel or unexpected calcification. Skull: Normal. Negative for fracture or focal lesion. Sinuses/Orbits: Right lens replacement. Other: None ASPECTS ([place] Program Early CT Score): 10 IMPRESSION: No CT evidence of new infarct.  No hemorrhage. Findings were paged to Dr. AroRory Percy 07/25/22 at 6:48 PM via [facility]. Electronically Signed   By: HemMarin RobertsD.   On: 07/25/2022 18:49    Scheduled Meds:   stroke: early stages of recovery book   Does not apply Once   amLODipine  10 mg Oral Daily   aspirin  81 mg Oral Daily   atorvastatin  80 mg Oral QHS   clopidogrel  75 mg Oral Daily   colchicine  0.6 mg Oral BID   DULoxetine  60 mg Oral QPM   empagliflozin  25 mg Oral QAC breakfast   enoxaparin (LOVENOX) injection  40 mg Subcutaneous Q24H   ezetimibe  10 mg Oral Daily   isosorbide mononitrate  30 mg Oral Daily   metoprolol succinate  25 mg  Oral Daily   tamsulosin  0.4 mg Oral QHS   Continuous Infusions:  sodium chloride 75 mL/hr at 07/26/22 0127    LOS: 0 days   Time spent: 20m72mWilliam C Daiden Coltrane, DO [facility]  If 7PM-7AM, please contact night-coverage [email or web address]  07/26/2022, 7:30 AM

## 2022-07-26 NOTE — Progress Notes (Signed)
Nurse was talking with wife re: getting a urine sample, discussing the plan with his medications. Wife then began crying and said he just told me he wanted to kill himself. This nurse asked the pt if he wanted to kill himself. Pt stated yes, I do. No one is listening to me. Nurse stated we are listening to you, we are trying to help you, find out why you are confused. Wife then stated either Thursday or Friday pt was trying to get into the safe at home where he keeps his guns. Nurse then messaged Dr, and informed him of the new assessment. Psych consult ordered. Will monitor closely.  Georgina Peer

## 2022-07-26 NOTE — Evaluation (Signed)
Physical Therapy Evaluation and Discharge Patient Details Name: Thomas [name] MRN: [id number] DOB: 01-03-1946 Today's Date: 07/26/2022  History of Present Illness  This 76 yo male admitted with sudden onset of speech difficulty and confusion and found to have acute metabolic encephalopathy. MRI:  Residual areas of restricted diffusion in the right corona  radiata and centrum semi ovale compatible with evolving infarcts.PHMx: HTN, unstable angina DM, OA of knee, HLD,chronic severe lower back pain, he was recently admitted 03/27/2022 to 07/10/2022, then rehab 07/10/2022 to 07/22/2022.  During that hospitalization had a atherectomy and PCI to prox-mid LAD. He also suffered a stroke with left-sided weakness.  Clinical Impression  Pt with recent d/c from [facility]. Pt seemingly with no new focal deficits or changes and appears to be back at his baseline. Pt ambulating 200 ft with a walker at a supervision level; reports left knee and chronic back pain. Negotiated 2 steps with railings with cues for sequencing/technique. He does report concerns about wife "admitting him here so she could steal 100,000 dollars." Unsure if this is accurate. Would recommend follow up OPPT for further balance, gait training and strengthening. No further acute PT needs.     Recommendations for follow up therapy are one component of a multi-disciplinary discharge planning process, led by the attending physician.  Recommendations may be updated based on patient status, additional functional criteria and insurance authorization.  Follow Up Recommendations Outpatient PT (neuro; already set up)      Assistance Recommended at Discharge Frequent or constant Supervision/Assistance  Patient can return home with the following  Assistance with cooking/housework;Direct supervision/assist for medications management;Direct supervision/assist for financial management;Assist for transportation    Equipment Recommendations None recommended by PT   Recommendations for Other Services       Functional Status Assessment Patient has not had a recent decline in their functional status     Precautions / Restrictions Precautions Precautions: Fall (without RW) Restrictions Weight Bearing Restrictions: No      Mobility  Bed Mobility               General bed mobility comments: Sitting EOB upon arrival    Transfers Overall transfer level: Modified independent Equipment used: Rolling walker (2 wheels)                    Ambulation/Gait Ambulation/Gait assistance: Supervision Gait Distance (Feet): 200 Feet Assistive device: Rolling walker (2 wheels) Gait Pattern/deviations: Step-through pattern, Decreased stance time - left, Decreased weight shift to left Gait velocity: decreased     General Gait Details: Mild antalgic gait pattern, pt reporting L knee pain. overall supervision for safety  Stairs Stairs: Yes Stairs assistance: Supervision Stair Management: Two rails Number of Stairs: 2 General stair comments: cues for sequencing/technique  Wheelchair Mobility    Modified Rankin (Stroke Patients Only)       Balance Overall balance assessment: Mild deficits observed, not formally tested                                           Pertinent Vitals/Pain Pain Assessment Pain Assessment: Faces Faces Pain Scale: Hurts little more Pain Location: chronic back pain, L knee Pain Descriptors / Indicators: Aching, Sore Pain Intervention(s): Limited activity within patient's tolerance, Monitored during session    Home Living Family/patient expects to be discharged to:: Private residence Living Arrangements: Spouse/significant other Available Help at Discharge: Family;Available  24 hours/day Type of Home: House Home Access: Level entry       Home Layout: One level Home Equipment: Conservation officer, nature (2 wheels);Cane - single point;BSC/3in1;Tub bench;Grab bars - toilet;Grab bars -  tub/shower;Wheelchair - Biomedical engineer Comments: was driving prior to recent stroke. Per pt he says he will now go to live with a relative that is coming back from an [place] game today or tomorrow (because pt doesn't trust his wife now). That house has steps to enter with one rail, but he is unsure of bathroom set up (all of his DME is at his house and he has been using it)    Prior Function Prior Level of Function : Independent/Modified Independent             Mobility Comments: RW for mobility since stroke. SPC prior to stroke       Hand Dominance   Dominant Hand: Right    Extremity/Trunk Assessment   Upper Extremity Assessment Upper Extremity Assessment: Defer to OT evaluation    Lower Extremity Assessment Lower Extremity Assessment: LLE deficits/detail LLE Deficits / Details: MMT 5/5       Communication   Communication: HOH  Cognition Arousal/Alertness: Awake/alert Behavior During Therapy: WFL for tasks assessed/performed Overall Cognitive Status: No family/caregiver present to determine baseline cognitive functioning                                 General Comments: Pt with paranoia, states his wife is trying to steal 100,000 dollars        General Comments      Exercises     Assessment/Plan    PT Assessment Patient does not need any further PT services  PT Problem List Decreased strength;Decreased mobility;Decreased safety awareness;Decreased range of motion;Decreased coordination;Decreased knowledge of precautions;Decreased activity tolerance;Decreased cognition;Decreased balance;Cardiopulmonary status limiting activity;Decreased knowledge of use of DME       PT Treatment Interventions      PT Goals (Current goals can be found in the Care Plan section)  Acute Rehab PT Goals Patient Stated Goal: to go home with a friend PT Goal Formulation: All assessment and education complete, DC therapy    Frequency        Co-evaluation               AM-PAC PT "6 Clicks" Mobility  Outcome Measure Help needed turning from your back to your side while in a flat bed without using bedrails?: None Help needed moving from lying on your back to sitting on the side of a flat bed without using bedrails?: None Help needed moving to and from a bed to a chair (including a wheelchair)?: None Help needed standing up from a chair using your arms (e.g., wheelchair or bedside chair)?: None Help needed to walk in hospital room?: A Little Help needed climbing 3-5 steps with a railing? : A Little 6 Click Score: 22    End of Session   Activity Tolerance: Patient tolerated treatment well Patient left: in bed;with call bell/phone within reach;with bed alarm set Nurse Communication: Mobility status PT Visit Diagnosis: Other abnormalities of gait and mobility (R26.89);Difficulty in walking, not elsewhere classified (R26.2)    Time: 7096-2836 PT Time Calculation (min) (ACUTE ONLY): 17 min   Charges:   PT Evaluation $PT Eval Low Complexity: [place], PT, [place] Office [phone]  Deno Etienne 07/26/2022, 9:43 AM

## 2022-07-27 ENCOUNTER — Telehealth: Payer: Self-pay | Admitting: Family Medicine

## 2022-07-27 DIAGNOSIS — R4781 Slurred speech: Secondary | ICD-10-CM | POA: Diagnosis not present

## 2022-07-27 MED ORDER — HALOPERIDOL LACTATE 5 MG/ML IJ SOLN
5.0000 mg | Freq: Two times a day (BID) | INTRAMUSCULAR | Status: DC | PRN
  Administered 2022-07-28: 5 mg via INTRAVENOUS
  Filled 2022-07-27: qty 1

## 2022-07-27 MED ORDER — LORAZEPAM 2 MG/ML IJ SOLN: 1.0000 mg | Freq: Two times a day (BID) | INTRAMUSCULAR | Status: DC | PRN

## 2022-07-27 NOTE — Consult Note (Signed)
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 He recently had a PCI for CAD and developed a post-procedural CVA which required inpatient rehab. The patient was discharged and presented to the ED 72 hours afterwards for altered mentation (though his wife names other various reasons she brought him in), for which he was admitted on 11/18. Evolution of prior stroke and new stroke were ruled out by neurology. Psychiatry consultation was requested for suicidal thoughts and altered mental status by Dr. Avon Gully.   Backstory provided by his wife earlier in the admission is very concerning. She reports he has been severely depressed and recently talked about suicide and went to his gun safe (presumably to shoot himself) but could not remember the code. He has had 5 medical admission within the past 6 months and his wife reports that he is increasingly frustrated by his poor health. The patient has several demographic risk factors as well: advanced age, caucasian race, and veteran status. This all indicates a need for inpatient psychiatric placement. Discussed with his wife and she prefers that the patient go to the [facility] if possible. LCSW working on this. Will likely need PT/OT eval before placement.   On assessment today the patient is alert and somewhat oriented but he cannot describe how or why he came to the hospital. He exhibits odd behaviors. He perseverates on his wife's absence from the bedside. He denies experiencing recent depression. Suspect some component of lingering delirium. Previous reports of paranoia regarding his wife are concerning. Ongoing discussion with primary MD regarding whether or not the patient is ready for  discharge to a psychiatric hospital.   Would consider addition of low dose oxycodone if patient appears to be in withdrawal. PDMP shows regular fills for this medication for the past 7 months.   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 This decision is based on my review of the chart including patient's history and current presentation, interview of the patient, mental status examination, and consideration of suicide risk including evaluating suicidal ideation, plan, intent, suicidal or self-harm behaviors, risk factors, and protective factors. This judgment is based on our ability to directly address suicide risk, implement suicide prevention strategies and develop a safety plan while the patient is in the clinical setting. Please contact our team if there is a concern that risk level has changed.   ## Medications:  -- Continue Cymbalta 60 mg daily -- Continue Zyprexa 5 mg QHS  --  Add Haldol 5 mg / Ativan 1 mg IV q12 hr PRN for agitation  -- EKG on 11/18 with Qtc > 500 -- Repeat with Qtc of 470 (better quality tracing)  ## Medical Decision Making Capacity:  Not assessed  ## Further Work-up:  --per primary  ## Disposition:  -- inpatient psychiatry,  LCSW exploring [facility]  ## Behavioral / Environmental:  -- delirium precautions  ##Legal Status VOL, consider IVC if tries to leave  Thank you for this consult request. Recommendations have been communicated to the primary team.  We will follow at this time.   Corky Sox, MD   followup history  Relevant Aspects of Hospital Course:  Admitted on 07/25/2022 for AMS.  Patient Report:  A&Ox3, DOWB. However, unable to relate events before hospital and has poor remote recall as well. Patient talks persistently about his wife not being there and him being worried and "depressed" about her being gone. He exhibits odd behavior, at one time saying he has his medication list in his sock. Unable to find this after removing his sock.   Denies recent depression (which contradicts previous statements). Reports poor sleep but fine appetite with an intentional 40 lb weight loss over the past year. Denies alcohol or drug use. He is miffed when asked about his suicidal statements, reports he has no memory of this.   ROS:  As above  Collateral information:  Provided by wife, as above  Psychiatric History:  Information collected from chart review  Family psych history: none   Social History: Actor  Family History:  The patient's family history includes Alzheimer's disease in his father; Heart disease in his paternal aunt and sister; Hypertension in his father and mother.  Medical History: Past Medical History:  Diagnosis Date   Actinic keratosis 05/12/2017   Back pain    Chronic systolic (congestive) heart failure ([place]) 05/06/2022   Depression 05/12/2017   Diabetes ([place])    GERD (gastroesophageal reflux disease) 05/12/2017   Hx of colonic polyps 05/12/2017   Hyperlipidemia 05/12/2017   Hypertension    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/2017   Low back pain 05/12/2017   Lumbar radiculopathy 05/12/2017   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   Obesity 05/12/2017   Osteoarthritis of knee 05/12/2017   Paresthesia  05/12/2017   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   Prostate nodule 05/12/2017   Right rotator cuff tear 10/01/2021   Seborrheic dermatitis 05/12/2017    Surgical History: Past Surgical History:  Procedure Laterality Date   ABDOMINAL AORTOGRAM W/LOWER EXTREMITY N/A 07/07/2022   Procedure: ABDOMINAL AORTOGRAM W/LOWER EXTREMITY;  Surgeon: Nigel Mormon, MD;  Location: [place] [facility];  Service: Cardiovascular;  Laterality: N/A;   CORONARY ATHERECTOMY N/A 07/07/2022   Procedure: CORONARY ATHERECTOMY;  Surgeon: Nigel Mormon, MD;  Location: [place] [facility];  Service: Cardiovascular;  Laterality: N/A;   CORONARY STENT INTERVENTION N/A 07/07/2022   Procedure: CORONARY STENT INTERVENTION;  Surgeon: Nigel Mormon, MD;  Location: [place] [facility];  Service: Cardiovascular;  Laterality: N/A;   HAND SURGERY Right    INTRAVASCULAR PRESSURE WIRE/FFR STUDY N/A 07/07/2022   Procedure: INTRAVASCULAR PRESSURE WIRE/FFR STUDY;  Surgeon: Nigel Mormon, MD;  Location: [place] [facility];  Service: Cardiovascular;  Laterality: N/A;   LEFT HEART CATH AND CORONARY ANGIOGRAPHY N/A 07/07/2022   Procedure: LEFT HEART CATH AND CORONARY ANGIOGRAPHY;  Surgeon: Nigel Mormon, MD;  Location: [place] [facility];  Service: Cardiovascular;  Laterality: N/A;   ORIF ANKLE FRACTURE Left  08/15/2007   Distal fibular   REPLACEMENT TOTAL KNEE Right 07/12/2012   RIGHT/LEFT HEART CATH AND CORONARY ANGIOGRAPHY N/A 01/12/2018   Procedure: RIGHT/LEFT HEART CATH AND CORONARY ANGIOGRAPHY;  Surgeon: Belva Crome, MD;  Location: [place] [facility];  Service: Cardiovascular;  Laterality: N/A;   RIGHT/LEFT HEART CATH AND CORONARY ANGIOGRAPHY N/A 05/19/2022   Procedure: RIGHT/LEFT HEART CATH AND CORONARY ANGIOGRAPHY;  Surgeon: Nigel Mormon, MD;  Location: [place] [facility];  Service: Cardiovascular;  Laterality: N/A;   SHOULDER SURGERY Right    UMBILICAL HERNIA REPAIR  06/23/2013     Medications:   Current Facility-Administered Medications:    acetaminophen (TYLENOL) tablet 650 mg, 650 mg, Oral, Q4H PRN **OR** acetaminophen (TYLENOL) 160 MG/5ML solution 650 mg, 650 mg, Per Tube, Q4H PRN **OR** acetaminophen (TYLENOL) suppository 650 mg, 650 mg, Rectal, Q4H PRN, Crosley, Debby, MD   amLODipine (NORVASC) tablet 10 mg, 10 mg, Oral, Daily, Crosley, Debby, MD, 10 mg at 07/27/22 1106   aspirin chewable tablet 81 mg, 81 mg, Oral, Daily, Crosley, Debby, MD, 81 mg at 07/27/22 1106   atorvastatin (LIPITOR) tablet 80 mg, 80 mg, Oral, QHS, Crosley, Debby, MD, 80 mg at 07/26/22 0033   clopidogrel (PLAVIX) tablet 75 mg, 75 mg, Oral, Daily, Crosley, Debby, MD, 75 mg at 07/27/22 1106   colchicine tablet 0.6 mg, 0.6 mg, Oral, BID, Crosley, Debby, MD, 0.6 mg at 07/27/22 1106   DULoxetine (CYMBALTA) DR capsule 60 mg, 60 mg, Oral, QPM, Crosley, Debby, MD, 60 mg at 07/26/22 1632   empagliflozin (JARDIANCE) tablet 25 mg, 25 mg, Oral, QAC breakfast, Crosley, Debby, MD, 25 mg at 07/27/22 0845   enoxaparin (LOVENOX) injection 40 mg, 40 mg, Subcutaneous, Q24H, Crosley, Debby, MD, 40 mg at 07/27/22 1113   ezetimibe (ZETIA) tablet 10 mg, 10 mg, Oral, Daily, Crosley, Debby, MD, 10 mg at 07/27/22 1105   isosorbide mononitrate (IMDUR) 24 hr tablet 30 mg, 30 mg, Oral, Daily, Crosley, Debby, MD, 30 mg at 07/27/22 1106   metoprolol succinate (TOPROL-XL) 24 hr tablet 25 mg, 25 mg, Oral, Daily, Crosley, Debby, MD, 25 mg at 07/27/22 1106   OLANZapine (ZYPREXA) tablet 5 mg, 5 mg, Oral, QHS, Little Ishikawa, MD, 5 mg at 07/26/22 1632   senna-docusate (Senokot-S) tablet 1 tablet, 1 tablet, Oral, QHS PRN, Crosley, Debby, MD   tamsulosin (FLOMAX) capsule 0.4 mg, 0.4 mg, Oral, QHS, Crosley, Debby, MD, 0.4 mg at 07/26/22 0033  Allergies: Allergies  Allergen Reactions   Wasp Venom Anaphylaxis, Itching and Swelling       Objective  Vital signs:  Temp:  [97.8 F (36.6 C)-98.7 F (37.1 C)] 98.4 F  (36.9 C) (11/20 0807) Pulse Rate:  [66-75] 75 (11/20 0807) Resp:  [18-20] 18 (11/20 0807) BP: (141-169)/(68-74) 169/74 (11/20 0807) SpO2:  [95 %-100 %] 99 % (11/20 7680)  Psychiatric Specialty Exam: Physical Exam Constitutional:      Appearance: the patient is not toxic-appearing.  Pulmonary:     Effort: Pulmonary effort is normal.  Neurological:     General: No focal deficit present.     Mental Status: the patient is alert and oriented to person, place, and time.   Review of Systems  Respiratory:  Negative for shortness of breath.   Cardiovascular:  Negative for chest pain.  Gastrointestinal:  Negative for abdominal pain, constipation, diarrhea, nausea and vomiting.  Neurological:  Negative for headaches.      BP (!) 147/73 (BP Location: Right Arm)   Pulse 73  Temp 98.5 F (36.9 C) (Axillary)   Resp 20   Ht _0  (1.803 m)   Wt 92 kg   SpO2 96%   BMI 28.29 kg/m   General Appearance: Fairly Groomed  Eye Contact:  Good  Speech:  Clear and Coherent  Volume:  Normal  Mood:  "worried"  Affect:  Congruent  Thought Process:  Coherent  Orientation:  partial  Thought Content: Logical  Suicidal Thoughts:  No  Homicidal Thoughts:  No  Memory:  Immediate;   poor  Judgement:  impaired  Insight:  impaired  Psychomotor Activity:  Normal  Concentration:  Concentration: fair  Recall:  poor  Fund of Knowledge: fair  Language: fair  Akathisia:  No  Handed:    AIMS (if indicated): not done  Assets:  Communication Skills Desire for Improvement Financial Resources/Insurance Housing Leisure Time Physical Health  ADL's:  Intact  Cognition: WNL  Sleep:  Fair    Blood pressure (!) 169/74, pulse 75, temperature 98.4 F (36.9 C), temperature source Oral, resp. rate 18, height _1  (1.803 m), weight 92 kg, SpO2 99 %. Body mass index is 28.29 kg/m.  Corky Sox, MD PGY-2

## 2022-07-27 NOTE — Progress Notes (Signed)
Restraints not present at shift change handoff; sitter reports taking them off. Pt doing well, calm. Still not redirectable, irritable, and verbally aggressive, but resting in bed and not restless.

## 2022-07-27 NOTE — Progress Notes (Signed)
SLP Cancellation Note  Patient Details Name: Thomas [name] MRN: [id number] DOB: 1945-12-29   Cancelled treatment:       Reason Eval/Treat Not Completed: Patient at procedure or test/unavailable (Pt was approached for evaluation, but pt was receiving medication from RN. SLP will follow up later as schedule allows.)  Myrka Sylva I. Hardin Negus, [place], [place] Office number [phone]  Horton Marshall 07/27/2022, 11:59 AM

## 2022-07-27 NOTE — Telephone Encounter (Signed)
Pt's wife, Loletha Carrow is wanting a cb to go over pt's med's. He is in the hospital for Slurred speech Principal problem 07/25/2022 - present (2 days) [facility]. Please advise [name] @ [phone] [facility])

## 2022-07-27 NOTE — Progress Notes (Addendum)
PROGRESS NOTE    DAMARIE SCHOOLFIELD  GSP:[id number] DOB: July 23, 1946 DOA: 07/25/2022 PCP: Haydee Salter, MD   Brief Narrative:  Pt is a 76 year old male with past medical history CHF - diastolic, dyslipidemia, hypertension, chronic severe lower back pain.  Patient was recently admitted 03/27/2022 to 07/10/2022, then rehab 07/10/2022 to 07/22/2022. During that hospitalization had a atherectomy and PCI to prox-mid LAD.  He also suffered a stroke with left-sided weakness.  He was discharged to rehab and improved - was home approximately 72h prior to having worsening mental status and confusion and subsequently readmitted to the hospital for mental status changes.  Hospitalist called for admission, neurology called as consult.  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)   Major depressive disorder, recurrent episode, severe (HCC)  Acute metabolic encephalopathy Rule out polypharmacy/overdose Rule out seizure Rule out subclinical infection -Unable to confirm home med list - multiple CNS depressants concern for polypharmacy, new wellbutrin medication started just prior to admit - medication holiday ongoing -New medication - wellbutrin started just prior to hospitalization -Neurology signed off, ruled out seizure or stroke -UA not indicative of infection, procalcitonin negative -Continue to follow - concern for possible sundowning given recent multiple location changes have been been admitted to the hospital subsequently transitioned to rehab and then home  in a short period of time.  Suicidal ideation with plan  Agitation, paranoid delusions ongoing -Patient's wife at bedside 11/19 confirms patient had mentioned self-harm to use guns in his safe to harm himself, patient agrees, states he was trying to get into his home safe to get to a gun -Psych consulted -Concern for paranoid delusions given patient discussion about wife and family trying to 'steal his belongings or money or property -and that they placed him in the hospital to get his stuff'. **As of 07/27/2022 patient is now denying any suicidal ideation, denies previously mentioning as above his plan, he does not recall any conversation yesterday with myself his wife for psychiatry; however, soon after this conversation he notes that he would like to call the police and state patrol to find his wife who we just spoke to on the phone at home who is on the way to the hospital at this time.  He continues to become agitated about his wife "going missing"  Evolving prior stroke -MRI confirms no new infarcts -Prior stroke evolution as expected -Neuro signing off, appreciate assistance  Hyperlipidemia -Stable, atorvastatin resumed   Sleep apnea -CPAP ordered   Chronic diastolic heart failure (HCC)/CAD not in acute exacerbation -Plavix, Jardiance, Zetia, Imdur, Toprol, Lipitor, aspirin, Norvasc resumed -Allowing some permissive hypertension, Toprol and Norvasc held -BP management   Acute on chronic kidney disease) stage 3a -Continue to advance p.o. fluid intake, follow repeat labs -I/O's, BMP in a.m.   Essential hypertension -Permissive hypertension  DVT prophylaxis: lovenox Code Status: DNR Family Communication: None present - wife unavailable by phone -voicemail left  Status is: Inpt  Dispo: The patient is from: Home  Anticipated d/c is to: TBD -likely inpatient Geri psych given above              Anticipated d/c date is: 24-48h              Patient currently NOT  medically stable for discharge  Consultants:  Neuro  Procedures:  None  Antimicrobials:  None   Subjective: Continues to be confused -patient somewhat combative overnight received 1 dose of Haldol, more oriented and appropriate this morning  Objective: Vitals:   07/26/22 1236 07/26/22 1925 07/26/22 2300 07/27/22 0319  BP: 137/76 (!) 142/68 (!) 149/69 (!) 141/68  Pulse: 72 74 73 66  Resp: _0 Temp: 98.7 F (37.1 C) 98.7 F (37.1 C) 97.8 F (36.6 C) 97.8 F (36.6 C)  TempSrc: Oral Oral Axillary Axillary  SpO2: 96% 100% 95% 97%  Weight:      Height:        Intake/Output Summary (Last 24 hours) at 07/27/2022 0727 Last data filed at 07/27/2022 0630 Gross per 24 hour  Intake 100 ml  Output 525 ml  Net -425 ml    Filed Weights   07/25/22 1908  Weight: 92 kg    Examination:  General:  Pleasantly resting in bed, No acute distress.  Awake alert oriented to person place and time HEENT:  Normocephalic atraumatic.  Sclerae nonicteric, noninjected.  Extraocular movements intact bilaterally. Neck:  Without mass or deformity.  Trachea is midline. Lungs:  Clear to auscultate bilaterally without rhonchi, wheeze, or rales. Heart:  Regular rate and rhythm.  Without murmurs, rubs, or gallops. Abdomen:  Soft, nontender, nondistended.  Without guarding or rebound. Extremities: Without cyanosis, clubbing, edema, or obvious deformity. Vascular:  Dorsalis pedis and posterior tibial pulses palpable bilaterally. Skin:  Warm and dry, no erythema, no ulcerations. Psych: Patient oriented but focused on his wife's location, fixated on calling state patrol and police to find his wife despite [place] explaining multiple times she is at home and we just spoke to her on the phone.  Threatening to leave AMA.  Data Reviewed: I have personally reviewed following labs and imaging studies  CBC: Recent Labs  Lab 07/25/22 1826 07/25/22 1833 07/26/22 0434  WBC 7.7  --  7.0  NEUTROABS 5.0   --   --   HGB 13.7 12.6* 12.8*  HCT 39.1 37.0* 37.4*  MCV 90.5  --  91.9  PLT 216  --  791    Basic Metabolic Panel: Recent Labs  Lab 07/25/22 1826 07/25/22 1833 07/26/22 0434  NA 141 139  --   K 3.6 3.6  --   CL 106 105  --   CO2 22  --   --   GLUCOSE 181* 178*  --   BUN 16 18  --   CREATININE 1.60* 1.60* 1.51*  CALCIUM 9.4  --   --     GFR: Estimated Creatinine Clearance: 47.5 mL/min (A) (by C-G formula based on SCr of 1.51 mg/dL (H)). Liver Function Tests: Recent Labs  Lab 07/25/22 1826  AST 25  ALT 44  ALKPHOS 115  BILITOT 0.7  PROT 6.1*  ALBUMIN 3.7    No results for input(s): "LIPASE", "AMYLASE" in the last 168 hours. No results for input(s): "AMMONIA" in the last 168 hours. Coagulation Profile: Recent Labs  Lab 07/25/22 1826  INR 1.1    Cardiac Enzymes: No results for input(s): "CKTOTAL", "CKMB", "CKMBINDEX", "TROPONINI" in the last 168 hours. BNP (last 3 results) No results  for input(s): "PROBNP" in the last 8760 hours. HbA1C: No results for input(s): "HGBA1C" in the last 72 hours. CBG: Recent Labs  Lab 07/21/22 1124 07/21/22 1633 07/21/22 2106 07/22/22 0553 07/25/22 1826  GLUCAP 156* 166* 116* 145* 165*    Lipid Profile: Recent Labs    2022/08/15 0434  CHOL 74  HDL 25*  LDLCALC 38  TRIG 54  CHOLHDL 3.0    Thyroid Function Tests: No results for input(s): "TSH", "T4TOTAL", "FREET4", "T3FREE", "THYROIDAB" in the last 72 hours. Anemia Panel: No results for input(s): "VITAMINB12", "FOLATE", "FERRITIN", "TIBC", "IRON", "RETICCTPCT" in the last 72 hours. Sepsis Labs: Recent Labs  Lab 08/15/2022 0802  PROCALCITON <0.10    No results found for this or any previous visit (from the past 240 hour(s)).       Radiology Studies: EEG adult  Result Date: 08-15-2022 Lora Havens, MD     08-15-22  8:06 AM Patient Name: Thomas [name] MRN: [id number] Epilepsy Attending: Lora Havens Referring Physician/Provider: Amie Portland,  MD Date: 08/15/2022 Duration: 27.08 mins Patient history: 76yo M with ams. EEG to evaluate for seizure. Level of alertness: lethargic AEDs during EEG study: None Technical aspects: This EEG study was done with scalp electrodes positioned according to the 10-20 International system of electrode placement. Electrical activity was reviewed with band pass filter of 1-_0 , sensitivity of 7 uV/mm, display speed of 51m/sec with a _1  notched filter applied as appropriate. EEG data were recorded continuously and digitally stored.  Video monitoring was available and reviewed as appropriate. Description: EEG showed continuous generalized 5 to 6 Hz theta slowing admixed with intermittent 2-_2  delta slowing. Hyperventilation and photic stimulation were not performed.   ABNORMALITY - Continuous slow, generalized IMPRESSION: This study is suggestive of moderate diffuse encephalopathy, nonspecific etiology. No seizures or epileptiform discharges were seen throughout the recording. PLora Havens  MR BRAIN WO CONTRAST  Result Date: 07/25/2022 CLINICAL DATA:  Sudden onset of confusion, abnormal speech and generalized weakness head 1 p.m. today. Acute infarcts are earlier this month following cardiac stenting. EXAM: MRI HEAD WITHOUT CONTRAST TECHNIQUE: Multiplanar, multiecho pulse sequences of the brain and surrounding structures were obtained without intravenous contrast. COMPARISON:  MR head 07/08/2022 FINDINGS: Brain: Residual area of restricted diffusion is present in the scratched at 2 residual areas of restricted diffusion are present in the right corona radiata and centrum semi ovale. Minimal residual decreased signal on the ADC map noted in the more posterior lesion. Majority of signal both lesions is T2 shine through. No new infarcts are present. No acute hemorrhage or mass lesion is present. Mild generalized atrophy and white matter disease otherwise is stable. The ventricles are proportionate to the degree of  atrophy. No significant extraaxial fluid collection is present. White matter changes extend into the brainstem. 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 Vascular: Flow is present in the major intracranial arteries. Skull and upper cervical spine: The craniocervical junction is normal. Upper cervical spine is within normal limits. Marrow signal is unremarkable. Sinuses/Orbits: Mild mucosal thickening is present in the inferior left maxillary sinus. The paranasal sinuses and mastoid air cells are otherwise clear. Right lens replacement is present. Globes and orbits are otherwise within normal limits. IMPRESSION: 1. Residual areas of restricted diffusion in the right corona radiata and centrum semi ovale compatible with evolving infarcts. 2. No new infarcts. 3. Stable atrophy and white matter disease likely reflects the sequela of chronic microvascular ischemia. 4. Remote lacunar infarcts of the  right cerebellum are stable. 5. Mild left maxillary sinus disease. Electronically Signed   By: San Morelle M.D.   On: 07/25/2022 19:51   CT ANGIO HEAD NECK W WO CM W PERF (CODE STROKE)  Result Date: 07/25/2022 CLINICAL DATA:  MRI Brain 07/09/22 EXAM: CT ANGIOGRAPHY HEAD AND NECK CT PERFUSION BRAIN TECHNIQUE: Multidetector CT imaging of the head and neck was performed using the standard protocol during bolus administration of intravenous contrast. Multiplanar CT image reconstructions and MIPs were obtained to evaluate the vascular anatomy. Carotid stenosis measurements (when applicable) are obtained utilizing NASCET criteria, using the distal internal carotid diameter as the denominator. Multiphase CT imaging of the brain was performed following IV bolus contrast injection. Subsequent parametric perfusion maps were calculated using RAPID software. RADIATION DOSE REDUCTION: This exam was performed according to the departmental dose-optimization program which includes automated exposure control,  adjustment of the mA and/or kV according to patient size and/or use of iterative reconstruction technique. CONTRAST:  137m OMNIPAQUE IOHEXOL 350 MG/ML SOLN COMPARISON:  None Available. FINDINGS: CT HEAD FINDINGS See same day CT brain no contrast-enhancing lesions visualized. Left maxillary sinus mucosal thickening, likely odontogenic1. CTA NECK FINDINGS Aortic arch: Standard branching. Imaged portion shows no evidence of aneurysm or dissection. There is mild-to-moderate narrowing of the right subclavian artery proximal to the takeoff of the subclavian artery. Right carotid system: No evidence of dissection, stenosis (50% or greater), or occlusion. Left carotid system: No evidence of dissection, stenosis (50% or greater), or occlusion. Vertebral arteries: Mild narrowing of the origin of the left vertebral artery. Mild narrowing in the proximal V2 segment of the right vertebral artery. Otherwise no evidence of hemodynamically significant stenosis. Skeleton: Degenerative changes with a small disc bulge at C3-C4, 1 C5-C6. Other neck: Negative. Upper chest: Negative. Review of the MIP images confirms the above findings CTA HEAD FINDINGS Anterior circulation: Moderate stenosis of the proximal cavernous segment of the left ICA (series 9, image 124). 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 Mild narrowing in the M1 segment of the right MCA. Moderate to severe narrowing of the M2 segment of the right MCA (series 9, image 110/series 11, image 69). Moderate stenosis of the M2 segment of the left MCA (series 9, image 114). Posterior circulation: Moderate stenosis of the P2 segment of the right PCA (series 9, image 97). Moderate stenosis of the P2 segment of the left PCA (series 9, image 109). Venous sinuses: As permitted by contrast timing, patent. Anatomic variants: None CT Brain Perfusion Findings: ASPECTS: 10 CBF (<30%) Volume: 011mPerfusion (Tmax>6.0s) volume: 26m726mismatch Volume:  26mL56mfarction Location:Negative for infarct Review of the MIP images confirms the above findings IMPRESSION: 1. No evidence of perfusion abnormality. 2. No intracranial large vessel occlusion. Moderate to severe narrowing of the M2 segment of the bilateral MCA and PCAs, as seen previopusly. 3. Moderate stenosis of the proximal cavernous segment of the left ICA. 4. 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 5. Mild-to-moderate narrowing of the right subclavian artery proximal to the takeoff of the subclavian artery. Electronically Signed   By: HemaMarin Roberts.   On: 07/25/2022 19:06   CT HEAD CODE STROKE WO CONTRAST  Result Date: 07/25/2022 CLINICAL DATA:  Code stroke.  Stroke suspected EXAM: CT HEAD WITHOUT CONTRAST TECHNIQUE: Contiguous axial images were obtained from the base of the skull through the vertex without intravenous contrast. RADIATION DOSE REDUCTION: This exam was  performed according to the departmental dose-optimization program which includes automated exposure control, adjustment of the mA and/or kV according to patient size and/or use of iterative reconstruction technique. COMPARISON:  MRI Brain 07/08/22 FINDINGS: Brain: No evidence of acute infarction, hydrocephalus, extra-axial collection or mass lesion/mass effect. Redemonstrated sequela of chronic microvascular ischemic change with likely small superimposed infarcts in the right corona radiata (series 100, image 22), left temporal lobe. No definite sites new infarcts are visualized. No evidence of hemorrhage. Vascular: No hyperdense vessel or unexpected calcification. Skull: Normal. Negative for fracture or focal lesion. Sinuses/Orbits: Right lens replacement. Other: None ASPECTS ([facility]ach[facility] Jensen Stroke Program Early CT Score): 10 IMPRESSION: No CT evidence of new infarct.  No hemorrhage. Findings were paged to Dr. Rory Percy on 07/25/22 at 6:48 PM via [name] paging system. Electronically Signed   By: Marin Roberts M.D.   On: 07/25/2022 18:49    Scheduled Meds:  amLODipine  10 mg Oral Daily   aspirin  81 mg Oral Daily   atorvastatin  80 mg Oral QHS   clopidogrel  75 mg Oral Daily   colchicine  0.6 mg Oral BID   DULoxetine  60 mg Oral QPM   empagliflozin  25 mg Oral QAC breakfast   enoxaparin (LOVENOX) injection  40 mg Subcutaneous Q24H   ezetimibe  10 mg Oral Daily   isosorbide mononitrate  30 mg Oral Daily   metoprolol succinate  25 mg Oral Daily   OLANZapine  5 mg Oral QHS   tamsulosin  0.4 mg Oral QHS   Continuous Infusions:    LOS: 1 day   Time spent: 59mn  Reuben Knoblock C Slaton Reaser, DO [facility]  If 7PM-7AM, please contact night-coverage [email or web address]  07/27/2022, 7:27 AM

## 2022-07-27 NOTE — Progress Notes (Signed)
Mobility Specialist: Progress Note   07/27/22 1213  Mobility  Activity Ambulated with assistance in hallway  Level of Assistance Contact guard assist, steadying assist  Assistive Device Front wheel walker  Distance Ambulated (ft) 300 ft  Activity Response Tolerated well  Mobility Referral Yes  $Mobility charge 1 Mobility   Post-Mobility: 82 HR  Pt received in the bed and agreeable to mobility. Mod I with bed mobility and contact guard during ambulation. C/o L knee pain during ambulation, no rating given. Decreased LLE clearance with fatigue. Cues for RW proximity, posture, and management as pt veering R towards end of ambulation. Pt back to bed after session with NT present in the room. Bed alarm is on.   Montgomery Daejon Lich Mobility Specialist Please contact via SecureChat or Rehab office at [phone]

## 2022-07-27 NOTE — Plan of Care (Signed)
Problem: Education: Goal: Understanding of CV disease, CV risk reduction, and recovery process will improve Outcome: Progressing Goal: Individualized Educational Video(s) Outcome: Progressing   Problem: Activity: Goal: Ability to return to baseline activity level will improve Outcome: Progressing   Problem: Cardiovascular: Goal: Ability to achieve and maintain adequate cardiovascular perfusion will improve Outcome: Progressing Goal: Vascular access site(s) Level 0-1 will be maintained Outcome: Progressing   Problem: Health Behavior/Discharge Planning: Goal: Ability to safely manage health-related needs after discharge will improve Outcome: Progressing   Problem: Education: Goal: Knowledge of cardiac device and self-care will improve Outcome: Progressing Goal: Ability to safely manage health related needs after discharge will improve Outcome: Progressing Goal: Individualized Educational Video(s) Outcome: Progressing   Problem: Cardiac: Goal: Ability to achieve and maintain adequate cardiopulmonary perfusion will improve Outcome: Progressing   Problem: Education: Goal: Knowledge of disease or condition will improve Outcome: Progressing Goal: Knowledge of secondary prevention will improve (MUST DOCUMENT ALL) Outcome: Progressing Goal: Knowledge of patient specific risk factors will improve (Mark N/A or DELETE if not current risk factor) Outcome: Progressing   Problem: Ischemic Stroke/TIA Tissue Perfusion: Goal: Complications of ischemic stroke/TIA will be minimized Outcome: Progressing   Problem: Coping: Goal: Will verbalize positive feelings about self Outcome: Progressing Goal: Will identify appropriate support needs Outcome: Progressing   Problem: Health Behavior/Discharge Planning: Goal: Ability to manage health-related needs will improve Outcome: Progressing Goal: Goals will be collaboratively established with patient/family Outcome: Progressing   Problem:  Self-Care: Goal: Ability to participate in self-care as condition permits will improve Outcome: Progressing Goal: Verbalization of feelings and concerns over difficulty with self-care will improve Outcome: Progressing Goal: Ability to communicate needs accurately will improve Outcome: Progressing   Problem: Nutrition: Goal: Risk of aspiration will decrease Outcome: Progressing Goal: Dietary intake will improve Outcome: Progressing   Problem: Education: Goal: Knowledge of General Education information will improve Description: Including pain rating scale, medication(s)/side effects and non-pharmacologic comfort measures Outcome: Progressing   Problem: Health Behavior/Discharge Planning: Goal: Ability to manage health-related needs will improve Outcome: Progressing   Problem: Clinical Measurements: Goal: Ability to maintain clinical measurements within normal limits will improve Outcome: Progressing Goal: Will remain free from infection Outcome: Progressing Goal: Diagnostic test results will improve Outcome: Progressing Goal: Respiratory complications will improve Outcome: Progressing Goal: Cardiovascular complication will be avoided Outcome: Progressing   Problem: Activity: Goal: Risk for activity intolerance will decrease Outcome: Progressing   Problem: Nutrition: Goal: Adequate nutrition will be maintained Outcome: Progressing   Problem: Coping: Goal: Level of anxiety will decrease Outcome: Progressing   Problem: Elimination: Goal: Will not experience complications related to bowel motility Outcome: Progressing Goal: Will not experience complications related to urinary retention Outcome: Progressing   Problem: Pain Managment: Goal: General experience of comfort will improve Outcome: Progressing   Problem: Safety: Goal: Ability to remain free from injury will improve Outcome: Progressing   Problem: Skin Integrity: Goal: Risk for impaired skin integrity  will decrease Outcome: Progressing   Problem: Education: Goal: Knowledge of General Education information will improve Description: Including pain rating scale, medication(s)/side effects and non-pharmacologic comfort measures Outcome: Progressing   Problem: Health Behavior/Discharge Planning: Goal: Ability to manage health-related needs will improve Outcome: Progressing   Problem: Clinical Measurements: Goal: Ability to maintain clinical measurements within normal limits will improve Outcome: Progressing Goal: Will remain free from infection Outcome: Progressing Goal: Diagnostic test results will improve Outcome: Progressing Goal: Respiratory complications will improve Outcome: Progressing Goal: Cardiovascular complication will be avoided Outcome: Progressing   Problem:   Activity: Goal: Risk for activity intolerance will decrease Outcome: Progressing   Problem: Nutrition: Goal: Adequate nutrition will be maintained Outcome: Progressing   Problem: Coping: Goal: Level of anxiety will decrease Outcome: Progressing

## 2022-07-27 NOTE — Telephone Encounter (Signed)
Lft VM to rtn call. Dm/cma

## 2022-07-28 DIAGNOSIS — R4781 Slurred speech: Secondary | ICD-10-CM | POA: Diagnosis not present

## 2022-07-28 LAB — BASIC METABOLIC PANEL
Anion gap: 12 (ref 5–15)
BUN: 19 mg/dL (ref 8–23)
CO2: 23 mmol/L (ref 22–32)
Calcium: 9.3 mg/dL (ref 8.9–10.3)
Chloride: 104 mmol/L (ref 98–111)
Creatinine, Ser: 1.37 mg/dL — ABNORMAL HIGH (ref 0.61–1.24)
GFR, Estimated: 53 mL/min — ABNORMAL LOW (ref >60)
Glucose, Bld: 141 mg/dL — ABNORMAL HIGH (ref 70–99)
Potassium: 3.1 mmol/L — ABNORMAL LOW (ref 3.5–5.1)
Sodium: 139 mmol/L (ref 135–145)

## 2022-07-28 LAB — CBC
HCT: 37 % — ABNORMAL LOW (ref 39.0–52.0)
Hemoglobin: 13.4 g/dL (ref 13.0–17.0)
MCH: 32.3 pg (ref 26.0–34.0)
MCHC: 36.2 g/dL — ABNORMAL HIGH (ref 30.0–36.0)
MCV: 89.2 fL (ref 80.0–100.0)
Platelets: 199 10*3/uL (ref 150–400)
RBC: 4.15 MIL/uL — ABNORMAL LOW (ref 4.22–5.81)
RDW: 12.4 % (ref 11.5–15.5)
WBC: 6.2 10*3/uL (ref 4.0–10.5)
nRBC: 0 % (ref 0.0–0.2)

## 2022-07-28 NOTE — Consult Note (Signed)
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 He recently had a PCI for CAD and developed a post-procedural CVA which required inpatient rehab. The patient was discharged and presented to the ED 72 hours afterwards for altered mentation (though his wife names other various reasons she brought him in), for which he was admitted on 11/18. Evolution of prior stroke and new stroke were ruled out by neurology. Psychiatry consultation was requested for suicidal thoughts and altered mental status by Dr. Avon Gully.   Backstory provided by his wife earlier in the admission is very concerning. She reports he has been severely depressed and recently talked about suicide and went to his gun safe (presumably to shoot himself) but could not remember the code. He has had 5 medical admission within the past 6 months and his wife reports that he is increasingly frustrated by his poor health. The patient has several demographic risk factors as well: advanced age, caucasian race, and veteran status. This all indicates a need for inpatient psychiatric placement. Discussed with his wife and she prefers that the patient go to the [facility] if possible. LCSW working on this.   11/20 On assessment today the patient is alert and somewhat oriented but he cannot describe how or why he came to the hospital. He exhibits odd behaviors. He perseverates on his wife's absence from the bedside. He denies experiencing recent depression. Suspect some component of lingering delirium. Previous reports of paranoia regarding his wife are concerning. Ongoing discussion with primary MD regarding whether or not the patient is ready for discharge to a psychiatric hospital.    11/21 Patient appears similar to yesterday with respect to mental status. He states that the last time he saw his wife was 5 days ago, when he actually saw her yesterday. Called wife and discussed continued need for psychiatric placement. First exam for IVC has been filled out with LCSW.  Will need to be renewed 11/28. Discussed the IVC with his wife and she expressed understanding. She would prefer for him to be discharged home. She states all the guns in the house have been removed.   She clarifies earlier statements, saying that the patient never tried to get a gun to shoot himself, but merely made suicidal statements, then hours later was incidentally found trying to open the gun safe.    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 This decision is based on my review of the chart including patient's  history and current presentation, interview of the patient, mental status examination, and consideration of suicide risk including evaluating suicidal ideation, plan, intent, suicidal or self-harm behaviors, risk factors, and protective factors. This judgment is based on our ability to directly address suicide risk, implement suicide prevention strategies and  develop a safety plan while the patient is in the clinical setting. Please contact our team if there is a concern that risk level has changed.   ## Medications:  -- Continue Cymbalta 60 mg daily -- Continue Zyprexa 5 mg QHS  -- Add Haldol 5 mg / Ativan 1 mg IV q12 hr PRN for agitation  -- EKG on 11/18 with Qtc > 500 -- Repeat with Qtc of 470 (better quality tracing)  ## Medical Decision Making Capacity:  Not assessed  ## Further Work-up:  --per primary  ## Disposition:  -- inpatient psychiatry, LCSW exploring [facility]  ## Behavioral / Environmental:  -- delirium precautions  ##Legal Status INVOLUNTARY  Thank you for this consult request. Recommendations have been communicated to the primary team.  We will follow at this time.   Corky Sox, MD   followup history  Relevant Aspects of Hospital Course:  Admitted on 07/25/2022 for AMS.  Patient Report:  A&Ox3, DOWB. However, unable to relate events before hospital and has poor remote recall as well. Patient talks persistently about his wife not being there and him being worried and "depressed" about her being gone. He exhibits odd behavior, at one time saying he has his medication list in his sock. Unable to find this after removing his sock.   Denies recent depression (which contradicts previous statements). Reports poor sleep but fine appetite with an intentional 40 lb weight loss over the past year. Denies alcohol or drug use. He is miffed when asked about his suicidal statements, reports he has no memory of this.   11/21 Patient able to name president and year, but strongly believes it is his birthday, [birth year]. Patient perseverates on wife's absence and says he has not seen her in 5 days. He reports staff have been fine towards him.   ROS:  As above  Collateral information:  Provided by wife, as above  Psychiatric History:  Information collected from chart review  Family psych history: none   Social  History: Actor  Family History:  The patient's family history includes Alzheimer's disease in his father; Heart disease in his paternal aunt and sister; Hypertension in his father and mother.  Medical History: Past Medical History:  Diagnosis Date   Actinic keratosis 05/12/2017   Back pain    Chronic systolic (congestive) heart failure ([place]) 05/06/2022   Depression 05/12/2017   Diabetes ([place])    GERD (gastroesophageal reflux disease) 05/12/2017   Hx of colonic polyps 05/12/2017   Hyperlipidemia 05/12/2017   Hypertension    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/2017   Low back pain 05/12/2017   Lumbar radiculopathy 05/12/2017   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   Obesity 05/12/2017   Osteoarthritis of knee 05/12/2017   Paresthesia 05/12/2017   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   Prostate nodule 05/12/2017   Right rotator cuff tear 10/01/2021   Seborrheic dermatitis 05/12/2017    Surgical History: Past Surgical History:  Procedure Laterality Date   ABDOMINAL AORTOGRAM W/LOWER EXTREMITY N/A 07/07/2022   Procedure: ABDOMINAL AORTOGRAM W/LOWER EXTREMITY;  Surgeon: Nigel Mormon, MD;  Location: [place] [facility];  Service: Cardiovascular;  Laterality: N/A;   CORONARY ATHERECTOMY N/A 07/07/2022   Procedure: CORONARY ATHERECTOMY;  Surgeon:  Patwardhan, Reynold Bowen, MD;  Location: [place] [facility];  Service: Cardiovascular;  Laterality: N/A;   CORONARY STENT INTERVENTION N/A 07/07/2022   Procedure: CORONARY STENT INTERVENTION;  Surgeon: Nigel Mormon, MD;  Location: [place] [facility];  Service: Cardiovascular;  Laterality: N/A;   HAND SURGERY Right    INTRAVASCULAR PRESSURE WIRE/FFR STUDY N/A 07/07/2022   Procedure: INTRAVASCULAR PRESSURE WIRE/FFR STUDY;  Surgeon: Nigel Mormon, MD;  Location: [place] [facility];  Service: Cardiovascular;  Laterality: N/A;   LEFT HEART CATH AND CORONARY ANGIOGRAPHY N/A 07/07/2022   Procedure: LEFT HEART CATH AND CORONARY ANGIOGRAPHY;  Surgeon: Nigel Mormon, MD;  Location: [place] [facility];  Service: Cardiovascular;  Laterality: N/A;   ORIF ANKLE FRACTURE Left 08/15/2007   Distal fibular   REPLACEMENT TOTAL KNEE Right 07/12/2012   RIGHT/LEFT HEART CATH AND CORONARY ANGIOGRAPHY N/A 01/12/2018   Procedure: RIGHT/LEFT HEART CATH AND CORONARY ANGIOGRAPHY;  Surgeon: Belva Crome, MD;  Location: [place] [facility];  Service: Cardiovascular;  Laterality: N/A;   RIGHT/LEFT HEART CATH AND CORONARY ANGIOGRAPHY N/A 05/19/2022   Procedure: RIGHT/LEFT HEART CATH AND CORONARY ANGIOGRAPHY;  Surgeon: Nigel Mormon, MD;  Location: [place] [facility];  Service: Cardiovascular;  Laterality: N/A;   SHOULDER SURGERY Right    UMBILICAL HERNIA REPAIR  06/23/2013    Medications:   Current Facility-Administered Medications:    acetaminophen (TYLENOL) tablet 650 mg, 650 mg, Oral, Q4H PRN **OR** acetaminophen (TYLENOL) 160 MG/5ML solution 650 mg, 650 mg, Per Tube, Q4H PRN **OR** acetaminophen (TYLENOL) suppository 650 mg, 650 mg, Rectal, Q4H PRN, Crosley, Debby, MD   amLODipine (NORVASC) tablet 10 mg, 10 mg, Oral, Daily, Crosley, Debby, MD, 10 mg at 07/28/22 0836   aspirin chewable tablet 81 mg, 81 mg, Oral, Daily, Crosley, Debby, MD, 81 mg at 07/28/22 0836   atorvastatin (LIPITOR) tablet 80 mg, 80 mg, Oral, QHS, Crosley, Debby, MD, 80 mg at 07/27/22 2050   clopidogrel (PLAVIX) tablet 75 mg, 75 mg, Oral, Daily, Crosley, Debby, MD, 75 mg at 07/28/22 0841   colchicine tablet 0.6 mg, 0.6 mg, Oral, BID, Crosley, Debby, MD, 0.6 mg at 07/28/22 0836   DULoxetine (CYMBALTA) DR capsule 60 mg, 60 mg, Oral, QPM, Crosley, Debby, MD, 60 mg at 07/27/22 1727   empagliflozin (JARDIANCE) tablet 25 mg, 25 mg, Oral, QAC breakfast, Crosley, Debby, MD, 25 mg at 07/27/22 0845   enoxaparin (LOVENOX) injection 40 mg, 40 mg, Subcutaneous, Q24H, Crosley, Debby, MD, 40 mg at 07/28/22 0836   ezetimibe (ZETIA) tablet 10 mg, 10 mg, Oral, Daily, Crosley, Debby, MD, 10 mg at 07/28/22 0836    haloperidol lactate (HALDOL) injection 5 mg, 5 mg, Intravenous, Q12H PRN **AND** LORazepam (ATIVAN) injection 1 mg, 1 mg, Intravenous, Q12H PRN, Corky Sox, MD   isosorbide mononitrate (IMDUR) 24 hr tablet 30 mg, 30 mg, Oral, Daily, Crosley, Debby, MD, 30 mg at 07/28/22 0836   metoprolol succinate (TOPROL-XL) 24 hr tablet 25 mg, 25 mg, Oral, Daily, Crosley, Debby, MD, 25 mg at 07/28/22 0836   OLANZapine (ZYPREXA) tablet 5 mg, 5 mg, Oral, QHS, Little Ishikawa, MD, 5 mg at 07/27/22 2049   senna-docusate (Senokot-S) tablet 1 tablet, 1 tablet, Oral, QHS PRN, Crosley, Debby, MD   tamsulosin (FLOMAX) capsule 0.4 mg, 0.4 mg, Oral, QHS, Crosley, Debby, MD, 0.4 mg at 07/27/22 2050  Allergies: Allergies  Allergen Reactions   Wasp Venom Anaphylaxis, Itching and Swelling       Objective  Vital  signs:  Temp:  [98 F (36.7 C)-98.8 F (37.1 C)] 98.4 F (36.9 C) (11/21 1105) Pulse Rate:  [68-83] 72 (11/21 1105) Resp:  [16-20] 18 (11/21 1105) BP: (122-178)/(54-83) 145/74 (11/21 1105) SpO2:  [96 %-98 %] 96 % (11/21 1105)  Psychiatric Specialty Exam: Physical Exam Constitutional:      Appearance: the patient is not toxic-appearing.  Pulmonary:     Effort: Pulmonary effort is normal.  Neurological:     General: No focal deficit present.     Mental Status: the patient is alert and oriented to person, place, and time.   Review of Systems  Respiratory:  Negative for shortness of breath.   Cardiovascular:  Negative for chest pain.  Gastrointestinal:  Negative for abdominal pain, constipation, diarrhea, nausea and vomiting.  Neurological:  Negative for headaches.      BP (!) 145/74 (BP Location: Left Arm)   Pulse 72   Temp 98.4 F (36.9 C)   Resp 18   Ht _0  (1.803 m)   Wt 92 kg   SpO2 96%   BMI 28.29 kg/m   General Appearance: Fairly Groomed  Eye Contact:  Good  Speech:  Clear and Coherent  Volume:  Normal  Mood:  "worried"  Affect:  Congruent  Thought Process:   Coherent  Orientation:  partial  Thought Content: Logical  Suicidal Thoughts:  No  Homicidal Thoughts:  No  Memory:  Immediate;   poor  Judgement:  impaired  Insight:  impaired  Psychomotor Activity:  Normal  Concentration:  Concentration: fair  Recall:  poor  Fund of Knowledge: fair  Language: fair  Akathisia:  No  Handed:    AIMS (if indicated): not done  Assets:  Communication Skills Desire for Improvement Financial Resources/Insurance Housing Leisure Time Physical Health  ADL's:  Intact  Cognition: WNL  Sleep:  Fair    Blood pressure (!) 145/74, pulse 72, temperature 98.4 F (36.9 C), resp. rate 18, height _1  (1.803 m), weight 92 kg, SpO2 96 %. Body mass index is 28.29 kg/m.  Corky Sox, MD PGY-2

## 2022-07-28 NOTE — Social Work (Signed)
CSW notified pt will need inpatient psych placement at [place], with spouse preference being the [facility]. CSW received referral packet from [place], all documentation added except for covid results, which are still pending. If pt is accepted to [place], it will be a hospital to hospital transfer. Pt IVC paperwork completed and signed by MD, Approved by magistrate, three copies on the chart, [employer] called to serve. CSW to send in referral when covid results. TOC will continue to follow for [facility] needs.
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
## 2022-07-28 NOTE — Plan of Care (Signed)
Problem: Education: Goal: Understanding of CV disease, CV risk reduction, and recovery process will improve Outcome: Progressing Goal: Individualized Educational Video(s) Outcome: Progressing   Problem: Activity: Goal: Ability to return to baseline activity level will improve Outcome: Progressing   Problem: Cardiovascular: Goal: Ability to achieve and maintain adequate cardiovascular perfusion will improve Outcome: Progressing Goal: Vascular access site(s) Level 0-1 will be maintained Outcome: Progressing   Problem: Health Behavior/Discharge Planning: Goal: Ability to safely manage health-related needs after discharge will improve Outcome: Progressing   Problem: Education: Goal: Knowledge of cardiac device and self-care will improve Outcome: Progressing Goal: Ability to safely manage health related needs after discharge will improve Outcome: Progressing Goal: Individualized Educational Video(s) Outcome: Progressing   Problem: Cardiac: Goal: Ability to achieve and maintain adequate cardiopulmonary perfusion will improve Outcome: Progressing   Problem: Education: Goal: Knowledge of disease or condition will improve Outcome: Progressing Goal: Knowledge of secondary prevention will improve (MUST DOCUMENT ALL) Outcome: Progressing Goal: Knowledge of patient specific risk factors will improve [place] N/A or DELETE if not current risk factor) Outcome: Progressing   Problem: Ischemic Stroke/TIA Tissue Perfusion: Goal: Complications of ischemic stroke/TIA will be minimized Outcome: Progressing   Problem: Coping: Goal: Will verbalize positive feelings about self Outcome: Progressing Goal: Will identify appropriate support needs Outcome: Progressing   Problem: Health Behavior/Discharge Planning: Goal: Ability to manage health-related needs will improve Outcome: Progressing Goal: Goals will be collaboratively established with patient/family Outcome: Progressing   Problem:  Self-Care: Goal: Ability to participate in self-care as condition permits will improve Outcome: Progressing Goal: Verbalization of feelings and concerns over difficulty with self-care will improve Outcome: Progressing Goal: Ability to communicate needs accurately will improve Outcome: Progressing   Problem: Nutrition: Goal: Risk of aspiration will decrease Outcome: Progressing Goal: Dietary intake will improve Outcome: Progressing   Problem: Education: Goal: Knowledge of General Education information will improve Description: Including pain rating scale, medication(s)/side effects and non-pharmacologic comfort measures Outcome: Progressing   Problem: Health Behavior/Discharge Planning: Goal: Ability to manage health-related needs will improve Outcome: Progressing   Problem: Clinical Measurements: Goal: Ability to maintain clinical measurements within normal limits will improve Outcome: Progressing Goal: Will remain free from infection Outcome: Progressing Goal: Diagnostic test results will improve Outcome: Progressing Goal: Respiratory complications will improve Outcome: Progressing Goal: Cardiovascular complication will be avoided Outcome: Progressing   Problem: Activity: Goal: Risk for activity intolerance will decrease Outcome: Progressing   Problem: Nutrition: Goal: Adequate nutrition will be maintained Outcome: Progressing   Problem: Coping: Goal: Level of anxiety will decrease Outcome: Progressing   Problem: Elimination: Goal: Will not experience complications related to bowel motility Outcome: Progressing Goal: Will not experience complications related to urinary retention Outcome: Progressing   Problem: Pain Managment: Goal: General experience of comfort will improve Outcome: Progressing   Problem: Safety: Goal: Ability to remain free from injury will improve Outcome: Progressing   Problem: Skin Integrity: Goal: Risk for impaired skin integrity  will decrease Outcome: Progressing   Problem: Education: Goal: Knowledge of General Education information will improve Description: Including pain rating scale, medication(s)/side effects and non-pharmacologic comfort measures Outcome: Progressing   Problem: Health Behavior/Discharge Planning: Goal: Ability to manage health-related needs will improve Outcome: Progressing   Problem: Clinical Measurements: Goal: Ability to maintain clinical measurements within normal limits will improve Outcome: Progressing Goal: Will remain free from infection Outcome: Progressing Goal: Diagnostic test results will improve Outcome: Progressing Goal: Respiratory complications will improve Outcome: Progressing Goal: Cardiovascular complication will be avoided Outcome: Progressing   Problem:  Activity: Goal: Risk for activity intolerance will decrease Outcome: Progressing   Problem: Nutrition: Goal: Adequate nutrition will be maintained Outcome: Progressing   Problem: Coping: Goal: Level of anxiety will decrease Outcome: Progressing

## 2022-07-28 NOTE — Telephone Encounter (Signed)
Spoke to patient's wife, Jocelyn Lamer, patient is being kept in hospital to be transferred to [facility] hospital for further care. Dm/cma

## 2022-07-28 NOTE — Evaluation (Signed)
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[place]) 07/25/2022   Acute ischemic stroke ([place]) 07/10/2022   Cerebrovascular accident (CVA) due to embolism of precerebral artery ([place]) 07/09/2022   CAD (coronary artery disease) 07/07/2022   Bilateral renal cysts 67/89/3810   Chronic systolic (congestive) heart failure ([place]) 05/06/2022   Aortic valve disorder 05/06/2022   Unstable angina ([place]) 05/06/2022   Regional wall motion abnormality of heart 05/06/2022   PAD (peripheral artery disease) ([place]) 17/51/0258   Acute systolic heart failure ([place]) 05/06/2022   Pericardial effusion 12/24/2021   Pleural effusion 11/04/2021   Pulmonary edema 11/04/2021   Incidental pulmonary nodule, > 42m and < 836m02/28/2023   Spinal stenosis at L4-L5 level 10/01/2021   Peripheral arterial disease (HC[place]01/25/2023   Type 2 diabetes mellitus with diabetic neuropathy, unspecified ([place]01/20/2023   Sensorineural hearing loss, bilateral 09/26/2021   Personal history of methicillin resistant Staphylococcus aureus 09/26/2021   Degeneration of lumbar or lumbosacral intervertebral disc 09/26/2021   Tobacco dependence 09/26/2021   Excessive daytime sleepiness 11/13/2020   Narcotic habituation, continuous ([facility]03/05/2021   At risk for central sleep apnea 11/13/2020   History of claustrophobia 11/13/2020   Obesity (BMI 30.0-34.9) 11/13/2020   Nocturia 11/13/2020   PTSD (post-traumatic stress disorder) 11/13/2020   Cognitive and behavioral changes 12/28/2019   Essential hypertension    Acute metabolic encephalopathy    Coronary artery disease 02/11/2018   Claudication  in peripheral vascular disease (HCWarm Springs06/03/2018   Atypical chest pain 01/12/2018   Coronary artery calcification 01/07/2018   CKD (chronic kidney disease) stage 3, GFR 30-59 ml/min (HCC) 11/25/2017   Chronic diastolic heart failure (HCM[place]09/02/2017   Aortic stenosis, mild 05/13/2017   Low back pain 05/12/2017   GERD (gastroesophageal reflux disease) 05/12/2017   Prostate nodule 05/12/2017   Depression 05/12/2017   Hx of colonic polyps 05/12/2017   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/2017   Hyperlipidemia 05/12/2017   Hypertensive heart disease with heart failure (HCViola09/01/2017   Osteoarthritis of left knee 05/12/2017   Sleep apnea 05/12/2017   Actinic keratosis 05/12/2017   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   Seborrheic dermatitis 05/12/2017   Paresthesia 05/12/2017   Past Medical History:  Past Medical History:  Diagnosis Date   Actinic keratosis 05/12/2017   Back pain    Chronic systolic (congestive) heart failure (HCEscondido8/30/2023   Depression 05/12/2017   Diabetes (HCDenton   GERD (gastroesophageal reflux disease) 05/12/2017   Hx of colonic polyps 05/12/2017   Hyperlipidemia 05/12/2017   Hypertension    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/2017   Low back pain 05/12/2017   Lumbar radiculopathy 05/12/2017   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   Obesity 05/12/2017   Osteoarthritis of knee 05/12/2017   Paresthesia 05/12/2017   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   Prostate nodule 05/12/2017   Right rotator cuff tear 10/01/2021   Seborrheic dermatitis 05/12/2017   Past Surgical History:  Past Surgical History:  Procedure Laterality Date   ABDOMINAL AORTOGRAM W/LOWER EXTREMITY N/A 07/07/2022   Procedure: ABDOMINAL AORTOGRAM W/LOWER EXTREMITY;  Surgeon: PaNigel MormonMD;  [facility];  Service: Cardiovascular;  Laterality: N/A;   CORONARY ATHERECTOMY N/A 07/07/2022  Procedure: CORONARY ATHERECTOMY;  Surgeon: Nigel Mormon, MD;  Location: [place] [facility] LAB;   Service: Cardiovascular;  Laterality: N/A;   CORONARY STENT INTERVENTION N/A 07/07/2022   Procedure: CORONARY STENT INTERVENTION;  Surgeon: Nigel Mormon, MD;  Location: [place] [facility] LAB;  Service: Cardiovascular;  Laterality: N/A;   HAND SURGERY Right    INTRAVASCULAR PRESSURE WIRE/FFR STUDY N/A 07/07/2022   Procedure: INTRAVASCULAR PRESSURE WIRE/FFR STUDY;  Surgeon: Nigel Mormon, MD;  Location: [place] [facility] LAB;  Service: Cardiovascular;  Laterality: N/A;   LEFT HEART CATH AND CORONARY ANGIOGRAPHY N/A 07/07/2022   Procedure: LEFT HEART CATH AND CORONARY ANGIOGRAPHY;  Surgeon: Nigel Mormon, MD;  Location: [place] [facility] LAB;  Service: Cardiovascular;  Laterality: N/A;   ORIF ANKLE FRACTURE Left 08/15/2007   Distal fibular   REPLACEMENT TOTAL KNEE Right 07/12/2012   RIGHT/LEFT HEART CATH AND CORONARY ANGIOGRAPHY N/A 01/12/2018   Procedure: RIGHT/LEFT HEART CATH AND CORONARY ANGIOGRAPHY;  Surgeon: Belva Crome, MD;  Location: [place] [facility] LAB;  Service: Cardiovascular;  Laterality: N/A;   RIGHT/LEFT HEART CATH AND CORONARY ANGIOGRAPHY N/A 05/19/2022   Procedure: RIGHT/LEFT HEART CATH AND CORONARY ANGIOGRAPHY;  Surgeon: Nigel Mormon, MD;  Location: [place] [facility] LAB;  Service: Cardiovascular;  Laterality: N/A;   SHOULDER SURGERY Right    UMBILICAL HERNIA REPAIR  06/23/2013   HPI:  76yo male admitted 07/25/22 with weakness, confusion, having difficulty with speech. Acute metabolic encephalopathy  PMH: CVA, systolic CHF, DM, back pain, peripheral neuropathy, HLD, HTN, unstable angina, embolic strokes, GERD   Assessment / Plan / Recommendation Clinical Impression  Pt was seen at bedside for assessment of speech, language, and cognition. Pt was pleasant and cooperative. Wife and sitter were present during this evaluation. Pt's speech is fully intelligible. Receptive and Expressive Language appear to be intact. The Mini-Mental State Examination was administered  today. Pt scored 27/30, which is WFL for his age and education (GED). Pt lost one point for inaccuracy for todays date, one point for spelling WORLD backwards, and one point for recall of 2 out of 3 unrelated words. If pt/wife notice functional difficulties after return to normal routines at home, they were encouraged to notify PCP, as OP or [place] may be beneficial.    SLP Assessment  SLP Recommendation/Assessment: All further Speech Language Pathology needs can be addressed in the next venue of care if needs arise  SLP Visit Diagnosis: Cognitive communication deficit (R41.841)    Recommendations for follow up therapy are one component of a multi-disciplinary discharge planning process, led by the attending physician.  Recommendations may be updated based on patient status, additional functional criteria and insurance authorization.    Follow Up Recommendations  if needs arise   Assistance Recommended at Discharge  Intermittent      SLP Evaluation Cognition  Overall Cognitive Status: Within Functional Limits for tasks assessed Arousal/Alertness: Awake/alert Orientation Level: Oriented X4 Year: 2023 Month: November Day of Week:  (correct for day, incorrect for date)       Comprehension  Auditory Comprehension Overall Auditory Comprehension: Appears within functional limits for tasks assessed Reading Comprehension Reading Status: Within funtional limits    Expression Expression Primary Mode of Expression: Verbal Verbal Expression Overall Verbal Expression: Appears within functional limits for tasks assessed Written Expression Dominant Hand: Right Written Expression: Within Functional Limits   Oral / Motor  Oral Motor/Sensory Function Overall Oral Motor/Sensory Function: Within functional limits Motor Speech Overall Motor Speech: Appears within functional  limits for tasks assessed Intelligibility: Intelligible           Gwenneth Whiteman B. Quentin Ore, [facility], [place] Speech Language  Pathologist Office: [phone]  Shonna Chock 07/28/2022, 3:50 PM

## 2022-07-29 ENCOUNTER — Inpatient Hospital Stay: Payer: Medicare Other | Admitting: Family Medicine

## 2022-07-29 DIAGNOSIS — R451 Restlessness and agitation: Secondary | ICD-10-CM

## 2022-07-29 LAB — SARS CORONAVIRUS 2 (TAT 6-24 HRS): SARS Coronavirus 2: NEGATIVE

## 2022-07-29 MED ORDER — LORAZEPAM 2 MG/ML IJ SOLN: 1.0000 mg | Freq: Two times a day (BID) | INTRAMUSCULAR | Status: DC | PRN

## 2022-07-29 MED ORDER — HALOPERIDOL LACTATE 5 MG/ML IJ SOLN: 2.0000 mg | Freq: Two times a day (BID) | INTRAMUSCULAR | Status: DC | PRN

## 2022-07-29 MED ORDER — OXYCODONE HCL 5 MG PO TABS
5.0000 mg | ORAL_TABLET | Freq: Four times a day (QID) | ORAL | Status: DC | PRN
  Administered 2022-07-29 – 2022-07-31 (×4): 5 mg via ORAL
  Filled 2022-07-29 (×4): qty 1

## 2022-07-29 MED ORDER — HALOPERIDOL 5 MG PO TABS
5.0000 mg | ORAL_TABLET | Freq: Two times a day (BID) | ORAL | Status: DC
  Administered 2022-07-29 – 2022-07-30 (×4): 5 mg via ORAL
  Filled 2022-07-29 (×5): qty 1

## 2022-07-29 MED ORDER — POTASSIUM CHLORIDE CRYS ER 20 MEQ PO TBCR
40.0000 meq | EXTENDED_RELEASE_TABLET | Freq: Once | ORAL | Status: AC
  Administered 2022-07-29: 40 meq via ORAL
  Filled 2022-07-29: qty 2

## 2022-07-29 NOTE — Progress Notes (Signed)
[facility] PROGRESS NOTE   Thomas [name] SWF:[id number] DOB: 1946/01/28 DOA: 07/25/2022  PCP: Haydee Salter, MD  Brief History/Interval Summary: 76 year old male with past medical history CHF - diastolic, dyslipidemia, hypertension, chronic severe lower back pain.  Patient was recently admitted 03/27/2022 to 07/10/2022, then rehab 07/10/2022 to 07/22/2022. During that hospitalization had an atherectomy and PCI to prox-mid LAD.  He also suffered a stroke with left-sided weakness.  He was discharged to rehab and improved - was home approximately 72h prior to having worsening mental status and confusion and subsequently readmitted to the hospital for mental status changes.     Patient's mental status has improved but is not yet back to baseline, continues to have hallucinations or paranoid delusions overnight, assaulting staff when they are near the bedside having flashbacks to what appears to be the [place] War.  He is otherwise alert oriented and appears to be himself this afternoon with wife at bedside but continues to become fixated on random events or objects in the room but is somewhat redirectable.    At this time patient is medically stable for discharge to inpatient psych, IVC in place on 07/28/2022 per psych.  Awaiting disposition to [place] in [facility] for inpatient psych bed.    Consultants: Neurology.  Psychiatry.     Subjective/Interval History: Patient denies any complaints this morning.  Slept well.  No nausea or vomiting.  No headaches.     Assessment/Plan:  Acute metabolic encephalopathy -Unable to confirm home med list - multiple CNS depressants concern for polypharmacy, new wellbutrin medication started just prior to admit - medication holiday ongoing -New medication - wellbutrin started just prior to hospitalization -Neurology signed off, ruled out seizure or stroke -UA not indicative of infection, procalcitonin negative -Continue to follow -clinically patient is  improving, unclear if this is due to supportive care, medication holiday or current regimen of Zyprexa and as needed Haldol.  Medically stable for discharge to inpatient psych at the [facility] interim awaiting bed.   Suicidal ideation with plan  Agitation, paranoid delusions ongoing -Patient's wife at bedside on 11/19 confirmed patient had mentioned self-harm to use guns in his safe to harm himself, patient agrees, states he was trying to get into his home safe to get to a gun -Psych consulted  IVC 07/28/2022 11/19 Concern for paranoid delusions given patient discussion about wife and family trying to 'steal his belongings or money or property -and that they placed him in the hospital to get his stuff'.             -11/20 patient denies any suicidal ideation, does not recall any prior conversations; then begins to ask for phone so he can call the police to report that his wife has gone missing, we just spoke to her on the phone and she is and route to the hospital             -11/21 overnight patient had another episode of agitation and was combative with staff, having flashbacks to "the war"  Appropriate at times but then at other times he becomes agitated.   Evolving prior stroke -MRI confirms no new infarcts -Prior stroke evolution as expected -Neuro signed off   Hyperlipidemia -Stable, atorvastatin resumed   Sleep apnea -CPAP ordered   Chronic diastolic heart failure (HCC)/CAD not in acute exacerbation -Plavix, Jardiance, Zetia, Imdur, Toprol, Lipitor, aspirin, Norvasc resumed -Allowing some permissive hypertension, Toprol and Norvasc held -BP management   Acute on chronic kidney disease) stage 3a -  Continue to advance p.o. fluid intake, follow repeat labs -I/O's, BMP in a.m.  Chronic back pain Noted to be on oxycodone.   Essential hypertension Continue with amlodipine metoprolol and isosorbide mononitrate.   DVT Prophylaxis: Lovenox Code Status: DNR Family Communication:  Discussed with patient.  No family at bedside Disposition Plan: Await placement to inpatient psychiatry at the [facility]  Status is: Inpatient Remains inpatient appropriate because: Agitation      Medications: Scheduled:  amLODipine  10 mg Oral Daily   aspirin  81 mg Oral Daily   atorvastatin  80 mg Oral QHS   clopidogrel  75 mg Oral Daily   colchicine  0.6 mg Oral BID   DULoxetine  60 mg Oral QPM   empagliflozin  25 mg Oral QAC breakfast   enoxaparin (LOVENOX) injection  40 mg Subcutaneous Q24H   ezetimibe  10 mg Oral Daily   isosorbide mononitrate  30 mg Oral Daily   metoprolol succinate  25 mg Oral Daily   OLANZapine  5 mg Oral QHS   tamsulosin  0.4 mg Oral QHS   Continuous: [id number] **OR** acetaminophen (TYLENOL) oral liquid 160 mg/5 mL **OR** acetaminophen, haloperidol lactate **AND** LORazepam, oxyCODONE, senna-docusate  Antibiotics: Anti-infectives (From admission, onward)    None       Objective:  Vital Signs  Vitals:   07/28/22 1933 07/29/22 0557 07/29/22 0800 07/29/22 0815  BP: 131/67 (!) 161/72  (!) 170/74  Pulse: 71 73  80  Resp: _0 Temp: 98.7 F (37.1 C) 97.7 F (36.5 C)  98 F (36.7 C)  TempSrc: Oral Oral  Oral  SpO2: 94% 100% 96% 98%  Weight:      Height:        Intake/Output Summary (Last 24 hours) at 07/29/2022 1019 Last data filed at 07/29/2022 0830 Gross per 24 hour  Intake 240 ml  Output --  Net 240 ml   Filed Weights   07/25/22 1908  Weight: 92 kg    General appearance: Awake alert.  In no distress Resp: Clear to auscultation bilaterally.  Normal effort Cardio: S1-S2 is normal regular.  No S3-S4.  No rubs murmurs or bruit GI: Abdomen is soft.  Nontender nondistended.  Bowel sounds are present normal.  No masses organomegaly Extremities: No edema.  Full range of motion of lower extremities.    Lab Results:  Data Reviewed: I have personally reviewed following labs and reports of the imaging  studies  CBC: Recent Labs  Lab 07/25/22 1826 07/25/22 1833 07/26/22 0434 07/28/22 0433  WBC 7.7  --  7.0 6.2  NEUTROABS 5.0  --   --   --   HGB 13.7 12.6* 12.8* 13.4  HCT 39.1 37.0* 37.4* 37.0*  MCV 90.5  --  91.9 89.2  PLT 216  --  203 224    Basic Metabolic Panel: Recent Labs  Lab 07/25/22 1826 07/25/22 1833 07/26/22 0434 07/28/22 0433  NA 141 139  --  139  K 3.6 3.6  --  3.1*  CL 106 105  --  104  CO2 22  --   --  23  GLUCOSE 181* 178*  --  141*  BUN 16 18  --  19  CREATININE 1.60* 1.60* 1.51* 1.37*  CALCIUM 9.4  --   --  9.3    GFR: Estimated Creatinine Clearance: 52.4 mL/min (A) (by C-G formula based on SCr of 1.37 mg/dL (H)).  Liver Function Tests: Recent Labs  Lab 07/25/22 1826  AST 25  ALT 44  ALKPHOS 115  BILITOT 0.7  PROT 6.1*  ALBUMIN 3.7     Coagulation Profile: Recent Labs  Lab 07/25/22 1826  INR 1.1    CBG: Recent Labs  Lab 07/25/22 1826  GLUCAP 165*     Recent Results (from the past 240 hour(s))  SARS CORONAVIRUS 2 (TAT 6-24 HRS) Anterior Nasal Swab     Status: None   Collection Time: 07/28/22  4:18 PM   Specimen: Anterior Nasal Swab  Result Value Ref Range Status   SARS Coronavirus 2 NEGATIVE NEGATIVE Final    Comment: (NOTE) SARS-CoV-2 target nucleic acids are NOT DETECTED.  The SARS-CoV-2 RNA is generally detectable in upper and lower respiratory specimens during the acute phase of infection. Negative results do not preclude SARS-CoV-2 infection, do not rule out co-infections with other pathogens, and should not be used as the sole basis for treatment or other patient management decisions. Negative results must be combined with clinical observations, patient history, and epidemiological information. The expected result is Negative.  Fact Sheet for Patients: SugarRoll.be  Fact Sheet for Healthcare Providers: https://www.woods-mathews.com/  This test is not yet approved or  cleared by the [place] FDA and  has been authorized for detection and/or diagnosis of SARS-CoV-2 by FDA under an Emergency Use Authorization (EUA). This EUA will remain  in effect (meaning this test can be used) for the duration of the COVID-19 declaration under Se ction 564(b)(1) of the Act, 21 U.S.C. section 360bbb-3(b)(1), unless the authorization is terminated or revoked sooner.  Performed at [facility], [place] [address], [place], [place] [address]       Radiology Studies: No results found.     LOS: 3 days   Arine Foley [employer] on [email or web address]  07/29/2022, 10:19 AM

## 2022-07-29 NOTE — Consult Note (Signed)
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 He recently had a PCI for CAD and developed a post-procedural CVA which required inpatient rehab. The patient was discharged and presented to the ED 72 hours afterwards for altered mentation (though his wife names other various reasons she brought him in), for which he was admitted on 11/18. Evolution of prior stroke and new stroke were ruled out by neurology. Psychiatry consultation was requested for suicidal thoughts and altered mental status by Dr. Avon Gully.   Backstory provided by his wife earlier in the admission is very concerning. She reports he has been severely depressed and recently talked about suicide and went to his gun safe (presumably to shoot himself) but could not remember the code. He has had 5 medical admission within the past 6 months and his wife reports that he is increasingly frustrated by his poor health. The patient has several demographic risk factors as well: advanced age, caucasian race, and veteran status. This all indicates a need for inpatient psychiatric placement. Discussed with his wife and she prefers that the patient go to the [facility] if possible. LCSW working on this.   11/20 On assessment today the patient is alert and somewhat oriented but he cannot describe how or why he came to the hospital. He exhibits odd behaviors. He perseverates on his wife's absence from the bedside. He denies experiencing recent depression. Suspect some component of lingering delirium. Previous reports of paranoia regarding his wife are concerning. Ongoing discussion with primary MD regarding whether or not the patient is ready for discharge to a psychiatric hospital.    11/21 Patient appears similar to yesterday with respect to mental status. He states that the last time he saw his wife was 5 days ago, when he actually saw her yesterday. Called wife and discussed continued need for psychiatric placement. First exam for IVC has been filled out with LCSW.  Will need to be renewed 11/28. Discussed the IVC with his wife and she expressed understanding. She would prefer for him to be discharged home. She states all the guns in the house have been removed.   She clarifies earlier statements, saying that the patient never tried to get a gun to shoot himself, but merely made suicidal statements, then hours later was incidentally found trying to open the gun safe.   11/22 Patient declined at [facility] psychiatry due to "dementia" (evidence for which I have not seen in my chart review). This morning his mentation has improved and he is able to relate some events that led him to come into the hospital. He is able to correctly state the month and his present location. He states that he trusts his family and he continues to deny SI. It appears he has difficulty in the early evening and at night (got Haldol PRN at 11 PM), so we will change his meds to haldol 5 mg at 3 PM and 5 mg QHS. He has tolerated and done well with Haldol so far. IVC remains in place. Ongoing discussion with [facility] psychiatry regarding geriatric psychiatry admission.    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 This decision is based on my review of the chart including patient's history and current presentation, interview of the patient, mental status examination, and consideration of suicide risk including evaluating suicidal ideation, plan, intent, suicidal or self-harm behaviors, risk factors, and protective factors. This judgment is based on our ability to directly address suicide risk, implement suicide prevention strategies and develop a safety plan while the patient is in the clinical setting. Please contact our team if there is a concern that risk level has changed.   ## Medications:  -- Continue Cymbalta 60 mg daily -- Change Zyprexa 5 mg QHS to Haldol 5 mg BID (3 PM and QHS) -- Haldol 2 mg / Ativan 1 mg IV q12 hr PRN for agitation  -- EKG on 11/18 with Qtc > 500 -- Repeat with Qtc of 470 (better quality tracing)  ## Medical Decision Making Capacity:  Not assessed  ## Further Work-up:  --per primary  ## Disposition:  -- inpatient psychiatry, declined at [facility], will see about [facility]  ## Behavioral / Environmental:  -- delirium precautions  ##Legal Status INVOLUNTARY  Thank you for this consult request. Recommendations have been communicated to the primary team.  We will follow at this time.   Corky Sox, MD   followup history  Relevant Aspects of Hospital Course:  Admitted on 07/25/2022 for AMS.  Patient Report:  A&Ox3, DOWB. However, unable to relate events before hospital and has poor remote recall as well. Patient talks persistently about his wife not being there and him being worried and "depressed" about her being gone. He exhibits odd behavior, at one time saying he has his medication list in his sock. Unable to find this after removing  his sock.   Denies recent depression (which contradicts previous statements). Reports poor sleep but fine appetite with an intentional 40 lb weight loss over the past year. Denies alcohol or drug use. He is miffed when asked about his suicidal statements, reports he has no memory of this.   11/21 Patient able to name president and year, but strongly believes it is his birthday, [birth year]. Patient perseverates on wife's absence and says he has not seen her in 5 days. He reports staff have been fine towards him.   11/22 On interview today the patient appears linear and logical.  He uses humor appropriately and answer several orientation questions correctly.  He denies experiencing suicidal thoughts and states that he is no longer feeling depressed.  ROS:  As above  Collateral information:  Provided by wife, as above  Psychiatric History:  Information collected from chart review  Family psych history: none   Social History: Actor  Family History:  The patient's family history includes Alzheimer's disease in his father; Heart disease in his paternal aunt and sister; Hypertension in his father and mother.  Medical History:  Past Medical History:  Diagnosis Date   Actinic keratosis 05/12/2017   Back pain    Chronic systolic (congestive) heart failure ([place]) 05/06/2022   Depression 05/12/2017   Diabetes ([place])    GERD (gastroesophageal reflux disease) 05/12/2017   Hx of colonic polyps 05/12/2017   Hyperlipidemia 05/12/2017   Hypertension    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/2017   Low back pain 05/12/2017   Lumbar radiculopathy 05/12/2017   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   Obesity 05/12/2017   Osteoarthritis of knee 05/12/2017   Paresthesia 05/12/2017   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   Prostate nodule 05/12/2017   Right rotator cuff tear 10/01/2021   Seborrheic dermatitis 05/12/2017    Surgical History: Past Surgical History:  Procedure Laterality Date   ABDOMINAL AORTOGRAM W/LOWER EXTREMITY  N/A 07/07/2022   Procedure: ABDOMINAL AORTOGRAM W/LOWER EXTREMITY;  Surgeon: Nigel Mormon, MD;  Location: [place] [facility];  Service: Cardiovascular;  Laterality: N/A;   CORONARY ATHERECTOMY N/A 07/07/2022   Procedure: CORONARY ATHERECTOMY;  Surgeon: Nigel Mormon, MD;  Location: [place] [facility];  Service: Cardiovascular;  Laterality: N/A;   CORONARY STENT INTERVENTION N/A 07/07/2022   Procedure: CORONARY STENT INTERVENTION;  Surgeon: Nigel Mormon, MD;  Location: [place] [facility];  Service: Cardiovascular;  Laterality: N/A;   HAND SURGERY Right    INTRAVASCULAR PRESSURE WIRE/FFR STUDY N/A 07/07/2022   Procedure: INTRAVASCULAR PRESSURE WIRE/FFR STUDY;  Surgeon: Nigel Mormon, MD;  Location: [place] [facility];  Service: Cardiovascular;  Laterality: N/A;   LEFT HEART CATH AND CORONARY ANGIOGRAPHY N/A 07/07/2022   Procedure: LEFT HEART CATH AND CORONARY ANGIOGRAPHY;  Surgeon: Nigel Mormon, MD;  Location: [place] [facility];  Service: Cardiovascular;  Laterality: N/A;   ORIF ANKLE FRACTURE Left 08/15/2007   Distal fibular   REPLACEMENT TOTAL KNEE Right 07/12/2012   RIGHT/LEFT HEART CATH AND CORONARY ANGIOGRAPHY N/A 01/12/2018   Procedure: RIGHT/LEFT HEART CATH AND CORONARY ANGIOGRAPHY;  Surgeon: Belva Crome, MD;  Location: [place] [facility];  Service: Cardiovascular;  Laterality: N/A;   RIGHT/LEFT HEART CATH AND CORONARY ANGIOGRAPHY N/A 05/19/2022   Procedure: RIGHT/LEFT HEART CATH AND CORONARY ANGIOGRAPHY;  Surgeon: Nigel Mormon, MD;  Location: [place] [facility];  Service: Cardiovascular;  Laterality: N/A;   SHOULDER SURGERY Right    UMBILICAL HERNIA REPAIR  06/23/2013    Medications:   Current Facility-Administered Medications:    acetaminophen (TYLENOL) tablet 650 mg, 650 mg, Oral, Q4H PRN, 650 mg at 07/28/22 1550 **OR** acetaminophen (TYLENOL) 160 MG/5ML solution 650 mg, 650 mg, Per Tube, Q4H PRN **OR** acetaminophen (TYLENOL)  suppository 650 mg, 650 mg, Rectal, Q4H PRN, Crosley, Debby, MD   amLODipine (NORVASC) tablet 10 mg, 10 mg, Oral, Daily, Crosley, Debby, MD, 10 mg at 07/29/22 0950   aspirin chewable tablet 81 mg, 81 mg, Oral, Daily, Crosley, Debby, MD, 81 mg at 07/29/22 0951   atorvastatin (LIPITOR) tablet 80 mg, 80 mg, Oral, QHS, Crosley, Debby, MD, 80 mg at 07/28/22 2041   clopidogrel (PLAVIX) tablet 75 mg, 75 mg, Oral, Daily, Crosley, Debby, MD, 75 mg at 07/29/22 0950   colchicine tablet 0.6 mg, 0.6 mg, Oral, BID, Crosley, Debby, MD, 0.6 mg at 07/29/22 0950   DULoxetine (CYMBALTA) DR capsule 60 mg, 60 mg, Oral, QPM, Crosley, Debby, MD, 60 mg at 07/28/22 1746   empagliflozin (JARDIANCE) tablet 25 mg, 25 mg, Oral, QAC breakfast, Crosley, Debby, MD, 25 mg at 07/29/22 0951   enoxaparin (LOVENOX) injection 40 mg, 40 mg,  Subcutaneous, Q24H, Crosley, Debby, MD, 40 mg at 07/29/22 0951   ezetimibe (ZETIA) tablet 10 mg, 10 mg, Oral, Daily, Crosley, Debby, MD, 10 mg at 07/29/22 0951   haloperidol (HALDOL) tablet 5 mg, 5 mg, Oral, BID, Corky Sox, MD   haloperidol lactate (HALDOL) injection 5 mg, 5 mg, Intravenous, Q12H PRN, 5 mg at 07/28/22 2255 **AND** LORazepam (ATIVAN) injection 1 mg, 1 mg, Intravenous, Q12H PRN, Corky Sox, MD   isosorbide mononitrate (IMDUR) 24 hr tablet 30 mg, 30 mg, Oral, Daily, Crosley, Debby, MD, 30 mg at 07/29/22 0956   metoprolol succinate (TOPROL-XL) 24 hr tablet 25 mg, 25 mg, Oral, Daily, Crosley, Debby, MD, 25 mg at 07/29/22 0951   oxyCODONE (Oxy IR/ROXICODONE) immediate release tablet 5 mg, 5 mg, Oral, Q6H PRN, Bonnielee Haff, MD, 5 mg at 07/29/22 1131   senna-docusate (Senokot-S) tablet 1 tablet, 1 tablet, Oral, QHS PRN, Crosley, Debby, MD   tamsulosin (FLOMAX) capsule 0.4 mg, 0.4 mg, Oral, QHS, Crosley, Debby, MD, 0.4 mg at 07/28/22 2245  Allergies: Allergies  Allergen Reactions   Wasp Venom Anaphylaxis, Itching and Swelling       Objective  Vital signs:  Temp:  [97.7  F (36.5 C)-98.7 F (37.1 C)] (P) 98.6 F (37 C) (11/22 1300) Pulse Rate:  [67-80] (P) 69 (11/22 1300) Resp:  [1-19] (P) 18 (11/22 1300) BP: (129-170)/(61-74) (P) 128/62 (11/22 1300) SpO2:  [94 %-100 %] (P) 97 % (11/22 1300)  Psychiatric Specialty Exam: Physical Exam Constitutional:      Appearance: the patient is not toxic-appearing.  Pulmonary:     Effort: Pulmonary effort is normal.  Neurological:     General: No focal deficit present.     Mental Status: the patient is alert and oriented to person, place, and time.   Review of Systems  Respiratory:  Negative for shortness of breath.   Cardiovascular:  Negative for chest pain.  Gastrointestinal:  Negative for abdominal pain, constipation, diarrhea, nausea and vomiting.  Neurological:  Negative for headaches.      BP (P) 128/62 (BP Location: Left Arm)   Pulse (P) 69   Temp (P) 98.6 F (37 C) (Oral)   Resp (P) 18   Ht _0  (1.803 m)   Wt 92 kg   SpO2 (P) 97%   BMI 28.29 kg/m   General Appearance: Fairly Groomed  Eye Contact:  Good  Speech:  Clear and Coherent  Volume:  Normal  Mood:  "good"  Affect:  euthymic  Thought Process:  Coherent  Orientation:  partial  Thought Content: Logical  Suicidal Thoughts:  No  Homicidal Thoughts:  No  Memory:  Immediate;   poor  Judgement:  impaired  Insight:  impaired  Psychomotor Activity:  Normal  Concentration:  Concentration: fair  Recall:  poor  Fund of Knowledge: fair  Language: fair  Akathisia:  No  Handed:    AIMS (if indicated): not done  Assets:  Armed forces logistics/support/administrative officer Desire for Improvement Financial Resources/Insurance Housing Leisure Time Physical Health  ADL's:  Intact  Cognition: WNL  Sleep:  Fair    Blood pressure (P) 128/62, pulse (P) 69, temperature (P) 98.6 F (37 C), temperature source (P) Oral, resp. rate (P) 18, height _1  (1.803 m), weight 92 kg, SpO2 (P) 97 %. Body mass index is 28.29 kg/m.  Corky Sox, MD PGY-2

## 2022-07-29 NOTE — TOC Progression Note (Signed)
Transition of Care [facility]) - Progression Note    Patient Details  Name: WILVER TIGNOR MRN: [id number] Date of Birth: 02/08/1946  Transition of Care [facility]) CM/SW [place], [place] Phone Number: 07/29/2022, 12:59 PM  Clinical Narrative:     CSW sent in referral to [facility] for psych placement. Referral was sent and reviewed, pt was declined due to behavioral issues, and no bed available. [facility] reviewing for placement. CSW spoke with pt's spouse who noted that she does not know how there was miscommunication, but states pt never went for a gun, they just have a lot of guns. She wants to bring him home, and states he is no danger. CSW updated MD to this report, and psych will continue to follow for further needs.        Expected Discharge Plan and Services                                                 Social Determinants of Health (SDOH) Interventions    Readmission Risk Interventions     No data to display

## 2022-07-29 NOTE — Progress Notes (Signed)
Mobility Specialist: Progress Note   07/29/22 1111  Mobility  Activity Ambulated with assistance in hallway  Level of Assistance Contact guard assist, steadying assist  Assistive Device Front wheel walker  Distance Ambulated (ft) 400 ft  Activity Response Tolerated well  Mobility Referral Yes  $Mobility charge 1 Mobility   Pt received sitting EOB and agreeable to mobility. No c/o throughout. R knee buckling noted occasionally throughout but pt able to self correct. Pt sitting EOB after session with NT present in the room.   Thomas [name] Mobility Specialist Please contact via SecureChat or Rehab office at [phone]

## 2022-07-29 NOTE — Care Management Important Message (Signed)
Important Message  Patient Details  Name: Thomas [name] MRN: [id number] Date of Birth: 1946/06/30   Medicare Important Message Given:  Yes     Arilla Hice Montine Circle 07/29/2022, 3:08 PM

## 2022-07-30 DIAGNOSIS — R4781 Slurred speech: Secondary | ICD-10-CM | POA: Diagnosis not present

## 2022-07-30 LAB — BASIC METABOLIC PANEL
Anion gap: 10 (ref 5–15)
BUN: 25 mg/dL — ABNORMAL HIGH (ref 8–23)
CO2: 22 mmol/L (ref 22–32)
Calcium: 8.7 mg/dL — ABNORMAL LOW (ref 8.9–10.3)
Chloride: 107 mmol/L (ref 98–111)
Creatinine, Ser: 1.58 mg/dL — ABNORMAL HIGH (ref 0.61–1.24)
GFR, Estimated: 45 mL/min — ABNORMAL LOW (ref >60)
Glucose, Bld: 155 mg/dL — ABNORMAL HIGH (ref 70–99)
Potassium: 3.6 mmol/L (ref 3.5–5.1)
Sodium: 139 mmol/L (ref 135–145)

## 2022-07-30 LAB — CBC
HCT: 37.4 % — ABNORMAL LOW (ref 39.0–52.0)
Hemoglobin: 13.3 g/dL (ref 13.0–17.0)
MCH: 31.9 pg (ref 26.0–34.0)
MCHC: 35.6 g/dL (ref 30.0–36.0)
MCV: 89.7 fL (ref 80.0–100.0)
Platelets: 207 10*3/uL (ref 150–400)
RBC: 4.17 MIL/uL — ABNORMAL LOW (ref 4.22–5.81)
RDW: 12.5 % (ref 11.5–15.5)
WBC: 5.9 10*3/uL (ref 4.0–10.5)
nRBC: 0 % (ref 0.0–0.2)

## 2022-07-30 NOTE — Plan of Care (Signed)
Friendly and talkative to staff member. Pt talking about the show on TV and how much he likes it. Pt is cooperative with staff.

## 2022-07-30 NOTE — Consult Note (Signed)
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 He recently had a PCI for CAD and developed a post-procedural CVA which required inpatient rehab. The patient was discharged and presented to the ED 72 hours afterwards for altered mentation (though his wife names other various reasons she brought him in), for which he was admitted on 11/18. Evolution of prior stroke and new stroke were ruled out by neurology. Psychiatry consultation was requested for suicidal thoughts and altered mental status by Dr. Avon Gully.   Backstory provided by his wife earlier in the admission is very concerning. She reports he has been severely depressed and recently talked about suicide and went to his gun safe (presumably to shoot himself) but could not remember the code. He has had 5 medical admission within the past 6 months and his wife reports that he is increasingly frustrated by his poor health. The patient has several demographic risk factors as well: advanced age, caucasian race, and veteran status. This all indicates a need for inpatient psychiatric placement. Discussed with his wife and she prefers that the patient go to the [facility] if possible. LCSW working on this.   11/20 On assessment today the patient is alert and somewhat oriented but he cannot describe how or why he came to the hospital. He exhibits odd behaviors. He perseverates on his wife's absence from the bedside. He denies experiencing recent depression. Suspect some component of lingering delirium. Previous reports of paranoia regarding his wife are concerning. Ongoing discussion with primary MD regarding whether or not the patient is ready for discharge to a psychiatric hospital.    11/21 Patient appears similar to yesterday with respect to mental status. He states that the last time he saw his wife was 5 days ago, when he actually saw her yesterday. Called wife and discussed continued need for psychiatric placement. First exam for IVC has been filled out with LCSW.  Will need to be renewed 11/28. Discussed the IVC with his wife and she expressed understanding. She would prefer for him to be discharged home. She states all the guns in the house have been removed.   She clarifies earlier statements, saying that the patient never tried to get a gun to shoot himself, but merely made suicidal statements, then hours later was incidentally found trying to open the gun safe.   11/22 Patient declined at [facility] psychiatry due to "dementia" (evidence for which I have not seen in my chart review). This morning his mentation has improved and he is able to relate some events that led him to come into the hospital. He is able to correctly state the month and his present location. He states that he trusts his family and he continues to deny SI. It appears he has difficulty in the early evening and at night (got Haldol PRN at 11 PM), so we will change his meds to haldol 5 mg at 3 PM and 5 mg QHS. He has tolerated and done well with Haldol so far. IVC remains in place. Ongoing discussion with [facility] psychiatry regarding geriatric psychiatry admission.   11/23 Patient's mentation has improved. Fully alert and oriented with intact attention. No paranoid or delusional statements. Appropriate affect. Continues to deny SI. Doing well with  Haldol BID. We are continuing to recommend inpatient psychiatric placement at this time.    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 This decision is based on my review of the chart including patient's history and current presentation, interview of the patient, mental status examination, and consideration of suicide risk including evaluating suicidal ideation, plan, intent, suicidal or self-harm behaviors, risk factors, and protective factors. This judgment is based on our ability to directly address suicide risk, implement suicide prevention strategies and develop a safety plan while the patient is in the clinical setting. Please contact our team if there is a concern that risk level has changed.   ## Medications:  -- Continue Cymbalta 60 mg daily -- Change Zyprexa 5 mg QHS to Haldol 5 mg BID (3 PM and QHS) -- Haldol 2 mg / Ativan 1 mg IV q12 hr PRN for agitation  -- EKG on 11/18 with Qtc > 500 -- Repeat with Qtc of 470 (better quality tracing)  ## Medical Decision Making Capacity:  Not assessed  ## Further Work-up:  --per primary  ## Disposition:  -- inpatient psychiatry, declined at [facility], will see about [facility]  ## Behavioral / Environmental:  -- delirium precautions  ##Legal Status INVOLUNTARY  Thank you for this consult request. Recommendations have been communicated to the primary team.  We will follow at this time.   Corky Sox, MD   followup history  Relevant Aspects of Hospital Course:  Admitted on 07/25/2022 for AMS.  Patient Report:  A&Ox3, DOWB. However, unable to relate events before hospital  and has poor remote recall as well. Patient talks persistently about his wife not being there and him being worried and "depressed" about her being gone. He exhibits odd behavior, at one time saying he has his medication list in his sock. Unable to find this after removing his sock.   Denies recent depression (which contradicts previous statements). Reports poor sleep but fine appetite with an intentional 40 lb weight loss over the past year. Denies alcohol or drug use. He is miffed when asked about his suicidal statements, reports he has no memory of this.   11/21 Patient able to name president and year, but strongly believes it is his birthday, [birth year]. Patient perseverates on wife's absence and says he has not seen her in 5 days. He reports staff have been fine towards him.   11/22 On interview today the patient appears linear and logical.  He uses humor appropriately and answer several orientation questions correctly.  He denies experiencing suicidal thoughts and states that he is no longer feeling depressed.  11/23 Smiles and jokes appropriately.  Expresses a desire to go home but is understanding of need for inpatient placement.  Discussed at length with wife at the bedside, who is clearly frustrated with the patient not being able to go home.  ROS:  As above  Collateral information:  Provided by wife, as above  Psychiatric History:  Information collected from chart review  Family psych history: none   Social History: Actor  Family History:  The patient's family history includes Alzheimer's disease in his father; Heart disease in his paternal aunt and sister; Hypertension in his father and mother.  Medical History: Past Medical History:  Diagnosis Date   Actinic keratosis 05/12/2017   Back pain    Chronic systolic (congestive) heart failure ([place]) 05/06/2022   Depression 05/12/2017   Diabetes ([place])    GERD (gastroesophageal reflux disease) 05/12/2017   Hx of colonic  polyps 05/12/2017   Hyperlipidemia 05/12/2017   Hypertension    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/2017   Low back pain 05/12/2017   Lumbar radiculopathy 05/12/2017   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   Obesity 05/12/2017   Osteoarthritis of knee 05/12/2017   Paresthesia 05/12/2017   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   Prostate nodule 05/12/2017   Right rotator cuff tear 10/01/2021   Seborrheic dermatitis 05/12/2017    Surgical History: Past Surgical History:  Procedure Laterality Date   ABDOMINAL AORTOGRAM W/LOWER EXTREMITY N/A 07/07/2022   Procedure: ABDOMINAL AORTOGRAM W/LOWER EXTREMITY;  Surgeon: Nigel Mormon, MD;  Location: [place] [facility];  Service: Cardiovascular;  Laterality: N/A;   CORONARY ATHERECTOMY N/A 07/07/2022   Procedure: CORONARY ATHERECTOMY;  Surgeon: Nigel Mormon, MD;  Location: [place] [facility];  Service: Cardiovascular;  Laterality: N/A;   CORONARY STENT INTERVENTION N/A 07/07/2022   Procedure: CORONARY STENT INTERVENTION;  Surgeon: Nigel Mormon, MD;  Location: [place] [facility];  Service: Cardiovascular;  Laterality: N/A;   HAND SURGERY Right    INTRAVASCULAR PRESSURE WIRE/FFR STUDY N/A 07/07/2022   Procedure: INTRAVASCULAR PRESSURE WIRE/FFR STUDY;  Surgeon: Nigel Mormon, MD;  Location: [place] [facility];  Service: Cardiovascular;  Laterality: N/A;   LEFT HEART CATH AND CORONARY ANGIOGRAPHY N/A 07/07/2022   Procedure: LEFT HEART CATH AND CORONARY ANGIOGRAPHY;  Surgeon: Nigel Mormon, MD;  Location: [place] [facility];  Service: Cardiovascular;  Laterality: N/A;   ORIF ANKLE FRACTURE Left 08/15/2007   Distal fibular   REPLACEMENT TOTAL KNEE Right 07/12/2012   RIGHT/LEFT HEART CATH AND CORONARY ANGIOGRAPHY N/A 01/12/2018   Procedure: RIGHT/LEFT HEART CATH AND CORONARY ANGIOGRAPHY;  Surgeon: Belva Crome, MD;  Location: [place] [facility];  Service: Cardiovascular;  Laterality: N/A;   RIGHT/LEFT HEART CATH AND CORONARY ANGIOGRAPHY N/A 05/19/2022    Procedure: RIGHT/LEFT HEART CATH AND CORONARY ANGIOGRAPHY;  Surgeon: Nigel Mormon, MD;  Location: [place] [facility];  Service: Cardiovascular;  Laterality: N/A;   SHOULDER SURGERY Right    UMBILICAL HERNIA REPAIR  06/23/2013    Medications:   Current Facility-Administered Medications:    acetaminophen (TYLENOL) tablet 650 mg, 650 mg, Oral, Q4H PRN, 650 mg at 07/28/22 1550 **OR** acetaminophen (TYLENOL) 160 MG/5ML solution 650 mg, 650 mg, Per Tube, Q4H PRN **OR** acetaminophen (TYLENOL) suppository 650 mg, 650 mg, Rectal, Q4H PRN, Crosley, Debby, MD   amLODipine (NORVASC) tablet 10 mg, 10 mg, Oral, Daily, Crosley, Debby, MD, 10 mg at 07/30/22 0847   aspirin chewable tablet 81 mg, 81 mg, Oral, Daily, Crosley, Debby, MD, 81 mg at 07/30/22 0847   atorvastatin (LIPITOR) tablet 80 mg, 80 mg, Oral, QHS, Crosley, Debby, MD, 80 mg at 07/29/22 2110  clopidogrel (PLAVIX) tablet 75 mg, 75 mg, Oral, Daily, Crosley, Debby, MD, 75 mg at 07/30/22 0847   colchicine tablet 0.6 mg, 0.6 mg, Oral, BID, Crosley, Debby, MD, 0.6 mg at 07/30/22 0847   DULoxetine (CYMBALTA) DR capsule 60 mg, 60 mg, Oral, QPM, Crosley, Debby, MD, 60 mg at 07/29/22 1608   empagliflozin (JARDIANCE) tablet 25 mg, 25 mg, Oral, QAC breakfast, Crosley, Debby, MD, 25 mg at 07/30/22 0847   enoxaparin (LOVENOX) injection 40 mg, 40 mg, Subcutaneous, Q24H, Crosley, Debby, MD, 40 mg at 07/30/22 0848   ezetimibe (ZETIA) tablet 10 mg, 10 mg, Oral, Daily, Crosley, Debby, MD, 10 mg at 07/30/22 0847   haloperidol (HALDOL) tablet 5 mg, 5 mg, Oral, BID, Corky Sox, MD, 5 mg at 07/29/22 2111   haloperidol lactate (HALDOL) injection 2 mg, 2 mg, Intravenous, Q12H PRN **AND** LORazepam (ATIVAN) injection 1 mg, 1 mg, Intravenous, Q12H PRN, Corky Sox, MD   isosorbide mononitrate (IMDUR) 24 hr tablet 30 mg, 30 mg, Oral, Daily, Crosley, Debby, MD, 30 mg at 07/30/22 0847   metoprolol succinate (TOPROL-XL) 24 hr tablet 25 mg, 25 mg, Oral, Daily,  Crosley, Debby, MD, 25 mg at 07/30/22 0846   oxyCODONE (Oxy IR/ROXICODONE) immediate release tablet 5 mg, 5 mg, Oral, Q6H PRN, Bonnielee Haff, MD, 5 mg at 07/30/22 1058   senna-docusate (Senokot-S) tablet 1 tablet, 1 tablet, Oral, QHS PRN, Crosley, Debby, MD   tamsulosin (FLOMAX) capsule 0.4 mg, 0.4 mg, Oral, QHS, Crosley, Debby, MD, 0.4 mg at 07/29/22 2110  Allergies: Allergies  Allergen Reactions   Wasp Venom Anaphylaxis, Itching and Swelling       Objective  Vital signs:  Temp:  [97.9 F (36.6 C)-98.4 F (36.9 C)] 98.3 F (36.8 C) (11/23 0810) Pulse Rate:  [58-76] 76 (11/23 0810) Resp:  [18-20] 18 (11/23 0810) BP: (128-148)/(65-78) 144/78 (11/23 0810) SpO2:  [94 %-99 %] 97 % (11/23 0538)  Psychiatric Specialty Exam: Physical Exam Constitutional:      Appearance: the patient is not toxic-appearing.  Pulmonary:     Effort: Pulmonary effort is normal.  Neurological:     General: No focal deficit present.     Mental Status: the patient is alert and oriented to person, place, and time.   Review of Systems  Respiratory:  Negative for shortness of breath.   Cardiovascular:  Negative for chest pain.  Gastrointestinal:  Negative for abdominal pain, constipation, diarrhea, nausea and vomiting.  Neurological:  Negative for headaches.      BP (!) 144/78 (BP Location: Right Arm)   Pulse 76   Temp 98.3 F (36.8 C) (Oral)   Resp 18   Ht _0  (1.803 m)   Wt 92 kg   SpO2 97%   BMI 28.29 kg/m   General Appearance: Fairly Groomed  Eye Contact:  Good  Speech:  Clear and Coherent  Volume:  Normal  Mood:  "good"  Affect:  euthymic  Thought Process:  Coherent  Orientation:  partial  Thought Content: Logical  Suicidal Thoughts:  No  Homicidal Thoughts:  No  Memory:  Immediate;   fair  Judgement:  fair  Insight:  fair  Psychomotor Activity:  Normal  Concentration:  Concentration: fair  Recall:  fair  Fund of Knowledge: fair  Language: fair  Akathisia:  No  Handed:     AIMS (if indicated): not done  Assets:  Communication Skills Desire for Improvement Financial Resources/Insurance Housing Leisure Time Physical Health  ADL's:  Intact  Cognition: WNL  Sleep:  Fair    Blood pressure (!) 144/78, pulse 76, temperature 98.3 F (36.8 C), temperature source Oral, resp. rate 18, height _0  (1.803 m), weight 92 kg, SpO2 97 %. Body mass index is 28.29 kg/m.  Corky Sox, MD PGY-2

## 2022-07-30 NOTE — Plan of Care (Signed)
Patient has progressed with ambulation, movement, and muscle control but neuro confusion through the day, related to impulsiveness and safety have declined.  Family has been very supportive with patient.

## 2022-07-30 NOTE — Progress Notes (Signed)
[facility] PROGRESS NOTE   Thomas [name] MGQ:[id number] DOB: 03-Feb-1946 DOA: 07/25/2022  PCP: Haydee Salter, MD  Brief History/Interval Summary: 76 year old male with past medical history CHF - diastolic, dyslipidemia, hypertension, chronic severe lower back pain.  Patient was recently admitted 03/27/2022 to 07/10/2022, then rehab 07/10/2022 to 07/22/2022. During that hospitalization had an atherectomy and PCI to prox-mid LAD.  He also suffered a stroke with left-sided weakness.  He was discharged to rehab and improved - was home approximately 72h prior to having worsening mental status and confusion and subsequently readmitted to the hospital for mental status changes.     Patient's mental status has improved but is not yet back to baseline, continues to have hallucinations or paranoid delusions overnight, assaulting staff when they are near the bedside having flashbacks to what appears to be the [place] War.  He is otherwise alert oriented and appears to be himself this afternoon with wife at bedside but continues to become fixated on random events or objects in the room but is somewhat redirectable.    At this time patient is medically stable for discharge to inpatient psych, IVC in place on 07/28/2022 per psych.   Consultants: Neurology.  Psychiatry.   Subjective/Interval History: Patient seems to be pleasant this morning.  Slightly distracted.  Denies any complaints.  Asking when he can go home.    Assessment/Plan:  Acute metabolic encephalopathy Concern for polypharmacy. -New medication - wellbutrin started just prior to hospitalization -Neurology signed off, ruled out seizure or stroke -UA not indicative of infection, procalcitonin negative Patient's mentation appears to have improved.  Continue to monitor.   Suicidal ideation with plan  Agitation, paranoid delusions ongoing -Patient's wife at bedside on 11/19 confirmed patient had mentioned self-harm to use guns in  his safe to harm himself, patient agrees, states he was trying to get into his home safe to get to a gun -Psych consulted  Patient was involuntarily committed on 07/28/2022. Psychiatry continues to follow.  Patient was started on twice a day haloperidol.  Remains on Cymbalta. Plan is for transfer to inpatient psychiatric facility.   Evolving prior stroke -MRI confirms no new infarcts -Prior stroke evolution as expected -Neuro signed off   Hyperlipidemia -Stable, atorvastatin resumed   Sleep apnea -CPAP ordered   Chronic diastolic heart failure (HCC)/CAD not in acute exacerbation Stable. Patient noted to be on Plavix, Jardiance, Zetia, Imdur, Toprol, Lipitor, aspirin, Norvasc.   Acute on chronic kidney disease) stage 3a Creatinine noted to be fluctuating.  Monitor urine output.  Seems to be close to baseline.  Chronic back pain Noted to be on oxycodone.   Essential hypertension Continue with amlodipine metoprolol and isosorbide mononitrate.   DVT Prophylaxis: Lovenox Code Status: DNR Family Communication: Discussed with patient.  No family at bedside Disposition Plan: Await placement to inpatient psychiatry  Status is: Inpatient Remains inpatient appropriate because: Agitation      Medications: Scheduled:  amLODipine  10 mg Oral Daily   aspirin  81 mg Oral Daily   atorvastatin  80 mg Oral QHS   clopidogrel  75 mg Oral Daily   colchicine  0.6 mg Oral BID   DULoxetine  60 mg Oral QPM   empagliflozin  25 mg Oral QAC breakfast   enoxaparin (LOVENOX) injection  40 mg Subcutaneous Q24H   ezetimibe  10 mg Oral Daily   haloperidol  5 mg Oral BID   isosorbide mononitrate  30 mg Oral Daily   metoprolol succinate  25  mg Oral Daily   tamsulosin  0.4 mg Oral QHS   Continuous: VFI:EPPIRJJOACZYS **OR** acetaminophen (TYLENOL) oral liquid 160 mg/5 mL **OR** acetaminophen, haloperidol lactate **AND** LORazepam, oxyCODONE, senna-docusate  Antibiotics: Anti-infectives (From  admission, onward)    None       Objective:  Vital Signs  Vitals:   07/29/22 1912 07/29/22 2334 07/30/22 0538 07/30/22 0810  BP: 128/65 138/70 (!) 148/67 (!) 144/78  Pulse: 64 65 66 76  Resp: _0 Temp: 98.1 F (36.7 C) 98 F (36.7 C) 97.9 F (36.6 C) 98.3 F (36.8 C)  TempSrc: Oral Oral Oral Oral  SpO2: 98% 94% 97%   Weight:      Height:        Intake/Output Summary (Last 24 hours) at 07/30/2022 0920 Last data filed at 07/29/2022 1300 Gross per 24 hour  Intake 320 ml  Output --  Net 320 ml    Filed Weights   07/25/22 1908  Weight: 92 kg    General appearance: Awake alert.  In no distress.  Mildly distracted Resp: Clear to auscultation bilaterally.  Normal effort Cardio: S1-S2 is normal regular.  No S3-S4.  No rubs murmurs or bruit GI: Abdomen is soft.  Nontender nondistended.  Bowel sounds are present normal.  No masses organomegaly Extremities: No edema.  Full range of motion of lower extremities.     Lab Results:  Data Reviewed: I have personally reviewed following labs and reports of the imaging studies  CBC: Recent Labs  Lab 07/25/22 1826 07/25/22 1833 07/26/22 0434 07/28/22 0433 07/30/22 0437  WBC 7.7  --  7.0 6.2 5.9  NEUTROABS 5.0  --   --   --   --   HGB 13.7 12.6* 12.8* 13.4 13.3  HCT 39.1 37.0* 37.4* 37.0* 37.4*  MCV 90.5  --  91.9 89.2 89.7  PLT 216  --  203 199 207     Basic Metabolic Panel: Recent Labs  Lab 07/25/22 1826 07/25/22 1833 07/26/22 0434 07/28/22 0433 07/30/22 0437  NA 141 139  --  139 139  K 3.6 3.6  --  3.1* 3.6  CL 106 105  --  104 107  CO2 22  --   --  23 22  GLUCOSE 181* 178*  --  141* 155*  BUN 16 18  --  19 25*  CREATININE 1.60* 1.60* 1.51* 1.37* 1.58*  CALCIUM 9.4  --   --  9.3 8.7*     GFR: Estimated Creatinine Clearance: 45.4 mL/min (A) (by C-G formula based on SCr of 1.58 mg/dL (H)).  Liver Function Tests: Recent Labs  Lab 07/25/22 1826  AST 25  ALT 44  ALKPHOS 115  BILITOT  0.7  PROT 6.1*  ALBUMIN 3.7      Coagulation Profile: Recent Labs  Lab 07/25/22 1826  INR 1.1     CBG: Recent Labs  Lab 07/25/22 1826  GLUCAP 165*      Recent Results (from the past 240 hour(s))  SARS CORONAVIRUS 2 (TAT 6-24 HRS) Anterior Nasal Swab     Status: None   Collection Time: 07/28/22  4:18 PM   Specimen: Anterior Nasal Swab  Result Value Ref Range Status   SARS Coronavirus 2 NEGATIVE NEGATIVE Final    Comment: (NOTE) SARS-CoV-2 target nucleic acids are NOT DETECTED.  The SARS-CoV-2 RNA is generally detectable in upper and lower respiratory specimens during the acute phase of infection. Negative results do not preclude SARS-CoV-2 infection, do not rule  out co-infections with other pathogens, and should not be used as the sole basis for treatment or other patient management decisions. Negative results must be combined with clinical observations, patient history, and epidemiological information. The expected result is Negative.  Fact Sheet for Patients: SugarRoll.be  Fact Sheet for Healthcare Providers: https://www.woods-mathews.com/  This test is not yet approved or cleared by the [place] FDA and  has been authorized for detection and/or diagnosis of SARS-CoV-2 by FDA under an Emergency Use Authorization (EUA). This EUA will remain  in effect (meaning this test can be used) for the duration of the COVID-19 declaration under Se ction 564(b)(1) of the Act, 21 U.S.C. section 360bbb-3(b)(1), unless the authorization is terminated or revoked sooner.  Performed at [facility], [place] [address], [place], [place] [address]       Radiology Studies: No results found.     LOS: 4 days   Jevonte Clanton [employer] on [email or web address]  07/30/2022, 9:20 AM

## 2022-07-31 ENCOUNTER — Other Ambulatory Visit (HOSPITAL_COMMUNITY): Payer: Self-pay

## 2022-07-31 DIAGNOSIS — R4781 Slurred speech: Secondary | ICD-10-CM | POA: Diagnosis not present

## 2022-07-31 MED ORDER — HALOPERIDOL 5 MG PO TABS
5.0000 mg | ORAL_TABLET | Freq: Two times a day (BID) | ORAL | 0 refills | Status: DC
  Filled 2022-07-31: qty 60, 30d supply, fill #0

## 2022-07-31 MED ORDER — HALOPERIDOL 5 MG PO TABS: 5.0000 mg | ORAL_TABLET | Freq: Two times a day (BID) | ORAL | 0 refills | Status: DC

## 2022-07-31 NOTE — TOC Transition Note (Signed)
Transition of Care [facility]) - CM/SW Discharge Note   Patient Details  Name: Thomas [name] MRN: [id number] Date of Birth: 09-20-1945  Transition of Care [facility]) CM/SW Contact:  Pollie Friar, RN Phone Number: 07/31/2022, 11:47 AM   Clinical Narrative:    Pt has been cleared by psychiatry to discharge home.  Pt is discharging home today with resumption of outpatient therapy at [facility] in [place]. CM has faxed them the resumption orders: [phone].  Wife to provide needed supervision at home and transportation to home.    Final next level of care: OP Rehab Barriers to Discharge: No Barriers Identified   Patient Goals and CMS Choice     Choice offered to / list presented to : Patient, Spouse  Discharge Placement                       Discharge Plan and Services                                     Social Determinants of Health (SDOH) Interventions     Readmission Risk Interventions     No data to display

## 2022-07-31 NOTE — TOC Transition Note (Signed)
Pt's insurance company is not contracted with TOC. Pt's wife requested that prescription for haloperidol be sent to [place] per RN. Prescription sent to pt's home pharmacy.  Garnet Sierras, PharmD

## 2022-07-31 NOTE — Consult Note (Signed)
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 He recently had a PCI for CAD and developed a post-procedural CVA which required inpatient rehab. The patient was discharged and presented to the ED 72 hours afterwards for altered mentation (though his wife names other various reasons she brought him in), for which he was admitted on 11/18. Evolution of prior stroke and new stroke were ruled out by neurology. Psychiatry consultation was requested for suicidal thoughts and altered mental status by Dr. Avon Gully.   Backstory provided by his wife earlier in the admission is very concerning. She reports he has been severely depressed and recently talked about suicide and went to his gun safe (presumably to shoot himself) but could not remember the code. He has had 5 medical admission within the past 6 months and his wife reports that he is increasingly frustrated by his poor health. The patient has several demographic risk factors as well: advanced age, caucasian race, and veteran status. This all indicates a need for inpatient psychiatric placement. Discussed with his wife and she prefers that the patient go to the [facility] if possible. LCSW working on this.   11/20 On assessment today the patient is alert and somewhat oriented but he cannot describe how or why he came to the hospital. He exhibits odd behaviors. He perseverates on his wife's absence from the bedside. He denies experiencing recent depression. Suspect some component of lingering delirium. Previous reports of paranoia regarding his wife are concerning. Ongoing discussion with primary MD regarding whether or not the patient is ready for discharge to a psychiatric hospital.    11/21 Patient appears similar to yesterday with respect to mental status. He states that the last time he saw his wife was 5 days ago, when he actually saw her yesterday. Called wife and discussed continued need for psychiatric placement. First exam for IVC has been filled out with LCSW.  Will need to be renewed 11/28. Discussed the IVC with his wife and she expressed understanding. She would prefer for him to be discharged home. She states all the guns in the house have been removed.   She clarifies earlier statements, saying that the patient never tried to get a gun to shoot himself, but merely made suicidal statements, then hours later was incidentally found trying to open the gun safe.   11/22 Patient declined at [facility] psychiatry due to "dementia" (evidence for which I have not seen in my chart review). This morning his mentation has improved and he is able to relate some events that led him to come into the hospital. He is able to correctly state the month and his present location. He states that he trusts his family and he continues to deny SI. It appears he has difficulty in the early evening and at night (got Haldol PRN at 11 PM), so we will change his meds to haldol 5 mg at 3 PM and 5 mg QHS. He has tolerated and done well with Haldol so far. IVC remains in place. Ongoing discussion with [facility] psychiatry regarding geriatric psychiatry admission.   11/23 Patient's mentation has improved. Fully alert and oriented with intact attention. No paranoid or delusional statements. Appropriate affect. Continues to deny SI. Doing well with  Haldol BID. We are continuing to recommend inpatient psychiatric placement at this time.   11/24 Patient continues to display good affect and engage well on interview. He denies SI and does not make any paranoid or delusional statements. He is slightly confused (cannot recall when thanksgiving was) but has intact attention (able to do days of the week backwards). He has  been determined to be medically stable for discharge. We initially pursued inpatient psychiatric placement for Mr. Horace Porteous. He was rejected at the [facility] and rejected at [facility] due to c/f dementia and medical complexity. Given the improvement in his mood and the futility of trying to find alternative psychiatric placement as well as safety planning done (guns removed from house per wife and stepson), feel the patient is appropriate for discharge. Will also note that stepson feels positively about discharge home and the patient's wife has been insistent on discharge home for several days. They both were contacted individually and state that all guns have been removed from the home.    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 This decision is based on my review of the chart including patient's history and current presentation, interview of the patient, mental status examination, and consideration of suicide risk including evaluating suicidal ideation, plan, intent, suicidal or self-harm behaviors, risk factors, and protective factors. This judgment is based on our ability to directly address suicide risk, implement suicide prevention strategies and develop a safety plan while  the patient is in the clinical setting. Please contact our team if there is a concern that risk level has changed.   ## Medications:  -- Continue Cymbalta 60 mg daily -- Continue Haldol 5 mg BID (3 PM and QHS) -- Haldol 2 mg / Ativan 1 mg IV q12 hr PRN for agitation  -- EKG on 11/18 with Qtc > 500 -- Repeat with Qtc of 470 (better quality tracing)  ## Medical Decision Making Capacity:  Not assessed  ## Further Work-up:  --per primary  ## Disposition:  -- home  ## Behavioral / Environmental:  -- delirium precautions  ##Legal Status VOL  Thank you for this consult request. Recommendations have been communicated to the primary team.  We will sign off at this time.   Corky Sox, MD   followup history  Relevant Aspects of Hospital Course:  Admitted on 07/25/2022 for AMS.  Patient Report:  A&Ox3, DOWB. However, unable to relate events before hospital and has poor remote recall as well. Patient talks persistently about his wife not being there and him being worried and "depressed" about her being gone. He exhibits odd behavior, at one time saying he has his medication list in his sock. Unable to find this after removing his sock.  Denies recent depression (which contradicts previous statements). Reports poor sleep but fine appetite with an intentional 40 lb weight loss over the past year. Denies alcohol or drug use. He is miffed when asked about his suicidal statements, reports he has no memory of this.   11/21 Patient able to name president and year, but strongly believes it is his birthday, [birth year]. Patient perseverates on wife's absence and says he has not seen her in 5 days. He reports staff have been fine towards him.   11/22 On interview today the patient appears linear and logical.  He uses humor appropriately and answer several orientation questions correctly.  He denies experiencing suicidal thoughts and states that he is no longer feeling  depressed.  11/23 Smiles and jokes appropriately.  Expresses a desire to go home but is understanding of need for inpatient placement.  Discussed at length with wife at the bedside, who is clearly frustrated with the patient not being able to go home.  11/24 Answers most orientation questions correctly. Attention intact on testing. Notably bright affect with appropriate humor. Denies SI, HI, AVH. Feels he has a lot to live for.    ROS:  As above  Collateral information:  Provided by wife, as above  Psychiatric History:  Information collected from chart review  Family psych history: none   Social History: Actor  Family History:  The patient's family history includes Alzheimer's disease in his father; Heart disease in his paternal aunt and sister; Hypertension in his father and mother.  Medical History: Past Medical History:  Diagnosis Date   Actinic keratosis 05/12/2017   Back pain    Chronic systolic (congestive) heart failure ([place]) 05/06/2022   Depression 05/12/2017   Diabetes ([place])    GERD (gastroesophageal reflux disease) 05/12/2017   Hx of colonic polyps 05/12/2017   Hyperlipidemia 05/12/2017   Hypertension    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/2017   Low back pain 05/12/2017   Lumbar radiculopathy 05/12/2017   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   Obesity 05/12/2017   Osteoarthritis of knee 05/12/2017   Paresthesia 05/12/2017   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   Prostate nodule 05/12/2017   Right rotator cuff tear 10/01/2021   Seborrheic dermatitis 05/12/2017    Surgical History: Past Surgical History:  Procedure Laterality Date   ABDOMINAL AORTOGRAM W/LOWER EXTREMITY N/A 07/07/2022   Procedure: ABDOMINAL AORTOGRAM W/LOWER EXTREMITY;  Surgeon: Nigel Mormon, MD;  Location: [facility];  Service: Cardiovascular;  Laterality: N/A;   CORONARY ATHERECTOMY N/A 07/07/2022   Procedure: CORONARY ATHERECTOMY;  Surgeon: Nigel Mormon, MD;  Location: [facility];  Service:  Cardiovascular;  Laterality: N/A;   CORONARY STENT INTERVENTION N/A 07/07/2022   Procedure: CORONARY STENT INTERVENTION;  Surgeon: Nigel Mormon, MD;  Location: [facility];  Service: Cardiovascular;  Laterality: N/A;   HAND SURGERY Right    INTRAVASCULAR PRESSURE WIRE/FFR STUDY N/A 07/07/2022   Procedure: INTRAVASCULAR PRESSURE WIRE/FFR STUDY;  Surgeon: Nigel Mormon, MD;  Location: [facility];  Service: Cardiovascular;  Laterality: N/A;   LEFT HEART CATH AND CORONARY ANGIOGRAPHY N/A 07/07/2022   Procedure: LEFT HEART CATH AND CORONARY ANGIOGRAPHY;  Surgeon: Nigel Mormon, MD;  Location: [facility];  Service: Cardiovascular;  Laterality: N/A;   ORIF ANKLE FRACTURE Left 08/15/2007   Distal fibular   REPLACEMENT TOTAL KNEE Right 07/12/2012   RIGHT/LEFT HEART CATH AND CORONARY ANGIOGRAPHY N/A 01/12/2018   Procedure: RIGHT/LEFT HEART CATH AND CORONARY ANGIOGRAPHY;  Surgeon:  Belva Crome, MD;  Location: [facility];  Service: Cardiovascular;  Laterality: N/A;   RIGHT/LEFT HEART CATH AND CORONARY ANGIOGRAPHY N/A 05/19/2022   Procedure: RIGHT/LEFT HEART CATH AND CORONARY ANGIOGRAPHY;  Surgeon: Nigel Mormon, MD;  Location: [facility];  Service: Cardiovascular;  Laterality: N/A;   SHOULDER SURGERY Right    UMBILICAL HERNIA REPAIR  06/23/2013    Medications:   Current Facility-Administered Medications:    acetaminophen (TYLENOL) tablet 650 mg, 650 mg, Oral, Q4H PRN, 650 mg at 07/28/22 1550 **OR** acetaminophen (TYLENOL) 160 MG/5ML solution 650 mg, 650 mg, Per Tube, Q4H PRN **OR** acetaminophen (TYLENOL) suppository 650 mg, 650 mg, Rectal, Q4H PRN, Crosley, Debby, MD   amLODipine (NORVASC) tablet 10 mg, 10 mg, Oral, Daily, Crosley, Debby, MD, 10 mg at 07/31/22 0859   aspirin chewable tablet 81 mg, 81 mg, Oral, Daily, Crosley, Debby, MD, 81 mg at 07/31/22 0859   atorvastatin (LIPITOR) tablet 80 mg, 80 mg, Oral, QHS, Crosley, Debby, MD,  80 mg at 07/29/22 2110   clopidogrel (PLAVIX) tablet 75 mg, 75 mg, Oral, Daily, Crosley, Debby, MD, 75 mg at 07/31/22 0859   colchicine tablet 0.6 mg, 0.6 mg, Oral, BID, Crosley, Debby, MD, 0.6 mg at 07/31/22 0859   DULoxetine (CYMBALTA) DR capsule 60 mg, 60 mg, Oral, QPM, Crosley, Debby, MD, 60 mg at 07/30/22 1823   empagliflozin (JARDIANCE) tablet 25 mg, 25 mg, Oral, QAC breakfast, Crosley, Debby, MD, 25 mg at 07/31/22 0859   enoxaparin (LOVENOX) injection 40 mg, 40 mg, Subcutaneous, Q24H, Crosley, Debby, MD, 40 mg at 07/31/22 0900   ezetimibe (ZETIA) tablet 10 mg, 10 mg, Oral, Daily, Crosley, Debby, MD, 10 mg at 07/31/22 0859   haloperidol (HALDOL) tablet 5 mg, 5 mg, Oral, BID, Corky Sox, MD, 5 mg at 07/30/22 2109   haloperidol lactate (HALDOL) injection 2 mg, 2 mg, Intravenous, Q12H PRN **AND** LORazepam (ATIVAN) injection 1 mg, 1 mg, Intravenous, Q12H PRN, Corky Sox, MD   isosorbide mononitrate (IMDUR) 24 hr tablet 30 mg, 30 mg, Oral, Daily, Crosley, Debby, MD, 30 mg at 07/31/22 0859   metoprolol succinate (TOPROL-XL) 24 hr tablet 25 mg, 25 mg, Oral, Daily, Crosley, Debby, MD, 25 mg at 07/31/22 0859   oxyCODONE (Oxy IR/ROXICODONE) immediate release tablet 5 mg, 5 mg, Oral, Q6H PRN, Bonnielee Haff, MD, 5 mg at 07/31/22 0859   senna-docusate (Senokot-S) tablet 1 tablet, 1 tablet, Oral, QHS PRN, Crosley, Debby, MD   tamsulosin (FLOMAX) capsule 0.4 mg, 0.4 mg, Oral, QHS, Crosley, Debby, MD, 0.4 mg at 07/30/22 2109  Allergies: Allergies  Allergen Reactions   Wasp Venom Anaphylaxis, Itching and Swelling       Objective  Vital signs:  Temp:  [97.9 F (36.6 C)-98.6 F (37 C)] 98.2 F (36.8 C) (11/24 8270) Pulse Rate:  [60-71] 71 (11/24 0621) Resp:  [16-19] 16 (11/24 0621) BP: (128-167)/(69-89) 167/89 (11/24 0621) SpO2:  [96 %-98 %] 97 % (11/24 7867)  Psychiatric Specialty Exam: Physical Exam Constitutional:      Appearance: the patient is not toxic-appearing.   Pulmonary:     Effort: Pulmonary effort is normal.  Neurological:     General: No focal deficit present.     Mental Status: the patient is alert and oriented to person, place, and time.   Review of Systems  Respiratory:  Negative for shortness of breath.   Cardiovascular:  Negative for chest pain.  Gastrointestinal:  Negative for abdominal pain, constipation, diarrhea, nausea and vomiting.  Neurological:  Negative for headaches.      BP (!) 167/89 (BP Location: Right Arm)   Pulse 71   Temp 98.2 F (36.8 C) (Oral)   Resp 16   Ht _0  (1.803 m)   Wt 92 kg   SpO2 97%   BMI 28.29 kg/m   General Appearance: Fairly Groomed  Eye Contact:  Good  Speech:  Clear and Coherent  Volume:  Normal  Mood:  "good"  Affect:  euthymic  Thought Process:  Coherent  Orientation:  partial  Thought Content: Logical  Suicidal Thoughts:  No  Homicidal Thoughts:  No  Memory:  Immediate;   fair  Judgement:  fair  Insight:  fair  Psychomotor Activity:  Normal  Concentration:  Concentration: fair  Recall:  fair  Fund of Knowledge: fair  Language: fair  Akathisia:  No  Handed:    AIMS (if indicated): not done  Assets:  Communication Skills Desire for Improvement Financial Resources/Insurance Housing Leisure Time Physical Health  ADL's:  Intact  Cognition: WNL  Sleep:  Fair    Blood pressure (!) 167/89, pulse 71, temperature 98.2 F (36.8 C), temperature source Oral, resp. rate 16, height _1  (1.803 m), weight 92 kg, SpO2 97 %. Body mass index is 28.29 kg/m.  Corky Sox, MD PGY-2

## 2022-07-31 NOTE — Progress Notes (Signed)
Discharge teaching complete. Meds, diet, activity, follow up appointments reviewed and all questions answered. Copy of instructions given to patient and prescription sent to pharmacy. Wife present during education. Patient discharged home via wheelchair.

## 2022-07-31 NOTE — Progress Notes (Signed)
[facility] PROGRESS NOTE   Thomas [name] VPX:[id number] DOB: 1945-09-26 DOA: 07/25/2022  PCP: Haydee Salter, MD  Brief History/Interval Summary: 76 year old male with past medical history CHF - diastolic, dyslipidemia, hypertension, chronic severe lower back pain.  Patient was recently admitted 03/27/2022 to 07/10/2022, then rehab 07/10/2022 to 07/22/2022. During that hospitalization had an atherectomy and PCI to prox-mid LAD.  He also suffered a stroke with left-sided weakness.  He was discharged to rehab and improved - was home approximately 72h prior to having worsening mental status and confusion and subsequently readmitted to the hospital for mental status changes.     Patient's mental status has improved but is not yet back to baseline, continues to have hallucinations or paranoid delusions overnight, assaulting staff when they are near the bedside having flashbacks to what appears to be the [place] War.  He is otherwise alert oriented and appears to be himself this afternoon with wife at bedside but continues to become fixated on random events or objects in the room but is somewhat redirectable.    Patient is medically stable for discharge to inpatient psych, IVC in place on 07/28/2022 per psych.   Consultants: Neurology.  Psychiatry.   Subjective/Interval History: Patient sitting up in the chair.  Denies any complaints.  Waiting on his breakfast.      Assessment/Plan:  Acute metabolic encephalopathy Concern for polypharmacy. Wellbutrin started just prior to hospitalization, currently on hold. -Neurology signed off, ruled out seizure or stroke -UA not indicative of infection, procalcitonin negative Patient's mentation appears to have improved.    Suicidal ideation with plan  Agitation, paranoid delusions ongoing -Patient's wife at bedside on 11/19 confirmed patient had mentioned self-harm to use guns in his safe to harm himself, patient agrees, states he was trying to  get into his home safe to get to a gun -Psych consulted  Patient was involuntarily committed on 07/28/2022. Psychiatry continues to follow.  Patient was started on twice a day haloperidol.  Remains on Cymbalta. Plan is for transfer to inpatient psychiatric facility when bed becomes available.  Patient is medically stable.   Evolving prior stroke -MRI confirms no new infarcts -Prior stroke evolution as expected -Neuro signed off   Hyperlipidemia -Stable, atorvastatin resumed   Sleep apnea -CPAP ordered   Chronic diastolic heart failure (HCC)/CAD not in acute exacerbation Stable. Patient noted to be on Plavix, Jardiance, Zetia, Imdur, Toprol, Lipitor, aspirin, Norvasc.   Acute on chronic kidney disease) stage 3a Creatinine noted to be fluctuating.  Monitor urine output.  Seems to be close to baseline.  Chronic back pain Noted to be on oxycodone.   Essential hypertension Continue with amlodipine metoprolol and isosorbide mononitrate.  Occasional high readings noted.   DVT Prophylaxis: Lovenox Code Status: DNR Family Communication: Discussed with patient.  No family at bedside Disposition Plan: Await placement to inpatient psychiatry  Status is: Inpatient Remains inpatient appropriate because: Agitation      Medications: Scheduled:  amLODipine  10 mg Oral Daily   aspirin  81 mg Oral Daily   atorvastatin  80 mg Oral QHS   clopidogrel  75 mg Oral Daily   colchicine  0.6 mg Oral BID   DULoxetine  60 mg Oral QPM   empagliflozin  25 mg Oral QAC breakfast   enoxaparin (LOVENOX) injection  40 mg Subcutaneous Q24H   ezetimibe  10 mg Oral Daily   haloperidol  5 mg Oral BID   isosorbide mononitrate  30 mg Oral Daily  metoprolol succinate  25 mg Oral Daily   tamsulosin  0.4 mg Oral QHS   Continuous: [id number] **OR** acetaminophen (TYLENOL) oral liquid 160 mg/5 mL **OR** acetaminophen, haloperidol lactate **AND** LORazepam, oxyCODONE,  senna-docusate  Antibiotics: Anti-infectives (From admission, onward)    None       Objective:  Vital Signs  Vitals:   07/30/22 1539 07/30/22 1934 07/30/22 2311 07/31/22 0621  BP: 135/73 134/69 128/70 (!) 167/89  Pulse: 62 63 60 71  Resp: _0 Temp: 97.9 F (36.6 C) 98.2 F (36.8 C) 98.6 F (37 C) 98.2 F (36.8 C)  TempSrc: Oral Oral Oral Oral  SpO2: 98% 97% 96% 97%  Weight:      Height:        Intake/Output Summary (Last 24 hours) at 07/31/2022 1036 Last data filed at 07/30/2022 1300 Gross per 24 hour  Intake 320 ml  Output --  Net 320 ml    Filed Weights   07/25/22 1908  Weight: 92 kg    General appearance: Awake alert.  In no distress Resp: Clear to auscultation bilaterally.  Normal effort Cardio: S1-S2 is normal regular.  No S3-S4.  No rubs murmurs or bruit GI: Abdomen is soft.  Nontender nondistended.  Bowel sounds are present normal.  No masses organomegaly     Lab Results:  Data Reviewed: I have personally reviewed following labs and reports of the imaging studies  CBC: Recent Labs  Lab 07/25/22 1826 07/25/22 1833 07/26/22 0434 07/28/22 0433 07/30/22 0437  WBC 7.7  --  7.0 6.2 5.9  NEUTROABS 5.0  --   --   --   --   HGB 13.7 12.6* 12.8* 13.4 13.3  HCT 39.1 37.0* 37.4* 37.0* 37.4*  MCV 90.5  --  91.9 89.2 89.7  PLT 216  --  203 199 207     Basic Metabolic Panel: Recent Labs  Lab 07/25/22 1826 07/25/22 1833 07/26/22 0434 07/28/22 0433 07/30/22 0437  NA 141 139  --  139 139  K 3.6 3.6  --  3.1* 3.6  CL 106 105  --  104 107  CO2 22  --   --  23 22  GLUCOSE 181* 178*  --  141* 155*  BUN 16 18  --  19 25*  CREATININE 1.60* 1.60* 1.51* 1.37* 1.58*  CALCIUM 9.4  --   --  9.3 8.7*     GFR: Estimated Creatinine Clearance: 45.4 mL/min (A) (by C-G formula based on SCr of 1.58 mg/dL (H)).  Liver Function Tests: Recent Labs  Lab 07/25/22 1826  AST 25  ALT 44  ALKPHOS 115  BILITOT 0.7  PROT 6.1*  ALBUMIN 3.7       Coagulation Profile: Recent Labs  Lab 07/25/22 1826  INR 1.1     CBG: Recent Labs  Lab 07/25/22 1826  GLUCAP 165*      Recent Results (from the past 240 hour(s))  SARS CORONAVIRUS 2 (TAT 6-24 HRS) Anterior Nasal Swab     Status: None   Collection Time: 07/28/22  4:18 PM   Specimen: Anterior Nasal Swab  Result Value Ref Range Status   SARS Coronavirus 2 NEGATIVE NEGATIVE Final    Comment: (NOTE) SARS-CoV-2 target nucleic acids are NOT DETECTED.  The SARS-CoV-2 RNA is generally detectable in upper and lower respiratory specimens during the acute phase of infection. Negative results do not preclude SARS-CoV-2 infection, do not rule out co-infections with other pathogens, and should not be used as  the sole basis for treatment or other patient management decisions. Negative results must be combined with clinical observations, patient history, and epidemiological information. The expected result is Negative.  Fact Sheet for Patients: SugarRoll.be  Fact Sheet for Healthcare Providers: https://www.woods-mathews.com/  This test is not yet approved or cleared by the [place] FDA and  has been authorized for detection and/or diagnosis of SARS-CoV-2 by FDA under an Emergency Use Authorization (EUA). This EUA will remain  in effect (meaning this test can be used) for the duration of the COVID-19 declaration under Se ction 564(b)(1) of the Act, 21 U.S.C. section 360bbb-3(b)(1), unless the authorization is terminated or revoked sooner.  Performed at [facility], [place] [address], [place], [place] [address]       Radiology Studies: No results found.     LOS: 5 days   Marshal Eskew [employer] on [email or web address]  07/31/2022, 10:36 AM

## 2022-07-31 NOTE — Discharge Summary (Signed)
[facility]  Physician Discharge Summary   Patient ID: Thomas [name] MRN: [id number] DOB/AGE: 78-Jan-1947 78 y.o.  Admit date: 07/25/2022 Discharge date: 07/31/2022    PCP: Haydee Salter, MD  DISCHARGE DIAGNOSES:   Acute metabolic encephalopathy   Hyperlipidemia   Chronic diastolic heart failure (HCC)   CKD (chronic kidney disease) stage 3, GFR 30-59 ml/min (HCC)   Coronary artery disease   Essential hypertension    CAD (coronary artery disease)    Major depressive disorder, recurrent episode, severe ([place])   RECOMMENDATIONS FOR OUTPATIENT FOLLOW UP: Patient instructed to follow-up with primary care provider   Home Health: Outpatient PT Equipment/Devices: None  CODE STATUS: Full code  DISCHARGE CONDITION: fair  Diet recommendation: As before  INITIAL HISTORY:  76 year old male with past medical history CHF - diastolic, dyslipidemia, hypertension, chronic severe lower back pain.  Patient was recently admitted 03/27/2022 to 07/10/2022, then rehab 07/10/2022 to 07/22/2022. During that hospitalization had an atherectomy and PCI to prox-mid LAD.  He also suffered a stroke with left-sided weakness.  He was discharged to rehab and improved - was home approximately 72h prior to having worsening mental status and confusion and subsequently readmitted to the hospital for mental status changes.     Consultations: Neurology Psychiatry    HOSPITAL COURSE:   Acute metabolic encephalopathy Concern for polypharmacy. Wellbutrin started just prior to hospitalization, currently on hold. -Neurology signed off, ruled out seizure or stroke -UA not indicative of infection, procalcitonin negative Patient's mentation appears to have improved.    Suicidal ideation with plan  Agitation, paranoid delusions ongoing Patient's wife at bedside on 11/19 confirmed patient had mentioned self-harm to use guns in his safe to harm himself, patient agrees, states he was trying to get into his  home safe to get to a gun. Psych consulted.  Patient was involuntarily committed.  Initially attempts were made to transfer him to inpatient psychiatric facilities but no beds were available at multiple facilities.  Patient stabilized in the meantime.  Psychiatry discussed with his family.  The guns have been removed from his house.  They feel that patient is stable to go home.  He will be discharged on Haldol twice a day as recommended by psychiatry.  Also remains on Cymbalta.   Evolving prior stroke -MRI confirms no new infarcts -Prior stroke evolution as expected -Neuro signed off   Hyperlipidemia -Stable, atorvastatin resumed   Sleep apnea -CPAP ordered   Chronic diastolic heart failure (HCC)/CAD not in acute exacerbation Stable. Patient noted to be on Plavix, Jardiance, Zetia, Imdur, Toprol, Lipitor, aspirin, Norvasc.   Chronic kidney disease) stage 3a Stable for the most part.   Chronic back pain Noted to be on oxycodone.   Essential hypertension Continue with amlodipine metoprolol and isosorbide mononitrate.  Occasional high readings noted.    Patient is stable.  Cleared by psychiatry.  Involuntary commitment was rescinded.  Okay for discharge home with his wife.   PERTINENT LABS:  The results of significant diagnostics from this hospitalization (including imaging, microbiology, ancillary and laboratory) are listed below for reference.    Microbiology: Recent Results (from the past 240 hour(s))  SARS CORONAVIRUS 2 (TAT 6-24 HRS) Anterior Nasal Swab     Status: None   Collection Time: 07/28/22  4:18 PM   Specimen: Anterior Nasal Swab  Result Value Ref Range Status   SARS Coronavirus 2 NEGATIVE NEGATIVE Final    Comment: (NOTE) SARS-CoV-2 target nucleic acids are NOT DETECTED.  The SARS-CoV-2 RNA is  generally detectable in upper and lower respiratory specimens during the acute phase of infection. Negative results do not preclude SARS-CoV-2 infection, do not rule  out co-infections with other pathogens, and should not be used as the sole basis for treatment or other patient management decisions. Negative results must be combined with clinical observations, patient history, and epidemiological information. The expected result is Negative.  Fact Sheet for Patients: SugarRoll.be  Fact Sheet for Healthcare Providers: https://www.woods-mathews.com/  This test is not yet approved or cleared by the [place] FDA and  has been authorized for detection and/or diagnosis of SARS-CoV-2 by FDA under an Emergency Use Authorization (EUA). This EUA will remain  in effect (meaning this test can be used) for the duration of the COVID-19 declaration under Se ction 564(b)(1) of the Act, 21 U.S.C. section 360bbb-3(b)(1), unless the authorization is terminated or revoked sooner.  Performed at [facility], [place] [address], [place], [place] [address]      Labs:   Basic Metabolic Panel: Recent Labs  Lab 07/25/22 1826 07/25/22 1833 07/26/22 0434 07/28/22 0433 07/30/22 0437  NA 141 139  --  139 139  K 3.6 3.6  --  3.1* 3.6  CL 106 105  --  104 107  CO2 22  --   --  23 22  GLUCOSE 181* 178*  --  141* 155*  BUN 16 18  --  19 25*  CREATININE 1.60* 1.60* 1.51* 1.37* 1.58*  CALCIUM 9.4  --   --  9.3 8.7*   Liver Function Tests: Recent Labs  Lab 07/25/22 1826  AST 25  ALT 44  ALKPHOS 115  BILITOT 0.7  PROT 6.1*  ALBUMIN 3.7    CBC: Recent Labs  Lab 07/25/22 1826 07/25/22 1833 07/26/22 0434 07/28/22 0433 07/30/22 0437  WBC 7.7  --  7.0 6.2 5.9  NEUTROABS 5.0  --   --   --   --   HGB 13.7 12.6* 12.8* 13.4 13.3  HCT 39.1 37.0* 37.4* 37.0* 37.4*  MCV 90.5  --  91.9 89.2 89.7  PLT 216  --  203 199 207    CBG: Recent Labs  Lab 07/25/22 1826  GLUCAP 165*     IMAGING STUDIES EEG adult  Result Date: 07/26/2022 Lora Havens, MD     07/26/2022  8:06 AM Patient Name: Thomas [name]  MRN: [id number] Epilepsy Attending: Lora Havens Referring Physician/Provider: Amie Portland, MD Date: 07/26/2022 Duration: 27.08 mins Patient history: 76yo M with ams. EEG to evaluate for seizure. Level of alertness: lethargic AEDs during EEG study: None Technical aspects: This EEG study was done with scalp electrodes positioned according to the 10-20 International system of electrode placement. Electrical activity was reviewed with band pass filter of 1-_0 , sensitivity of 7 uV/mm, display speed of 18m/sec with a _1  notched filter applied as appropriate. EEG data were recorded continuously and digitally stored.  Video monitoring was available and reviewed as appropriate. Description: EEG showed continuous generalized 5 to 6 Hz theta slowing admixed with intermittent 2-_2  delta slowing. Hyperventilation and photic stimulation were not performed.   ABNORMALITY - Continuous slow, generalized IMPRESSION: This study is suggestive of moderate diffuse encephalopathy, nonspecific etiology. No seizures or epileptiform discharges were seen throughout the recording. PLora Havens  MR BRAIN WO CONTRAST  Result Date: 07/25/2022 CLINICAL DATA:  Sudden onset of confusion, abnormal speech and generalized weakness head 1 p.m. today. Acute infarcts are earlier this month following cardiac stenting. EXAM: MRI  HEAD WITHOUT CONTRAST TECHNIQUE: Multiplanar, multiecho pulse sequences of the brain and surrounding structures were obtained without intravenous contrast. COMPARISON:  MR head 07/08/2022 FINDINGS: Brain: Residual area of restricted diffusion is present in the scratched at 2 residual areas of restricted diffusion are present in the right corona radiata and centrum semi ovale. Minimal residual decreased signal on the ADC map noted in the more posterior lesion. Majority of signal both lesions is T2 shine through. No new infarcts are present. No acute hemorrhage or mass lesion is present. Mild generalized atrophy  and white matter disease otherwise is stable. The ventricles are proportionate to the degree of atrophy. No significant extraaxial fluid collection is present. White matter changes extend into the brainstem. 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 Vascular: Flow is present in the major intracranial arteries. Skull and upper cervical spine: The craniocervical junction is normal. Upper cervical spine is within normal limits. Marrow signal is unremarkable. Sinuses/Orbits: Mild mucosal thickening is present in the inferior left maxillary sinus. The paranasal sinuses and mastoid air cells are otherwise clear. Right lens replacement is present. Globes and orbits are otherwise within normal limits. IMPRESSION: 1. Residual areas of restricted diffusion in the right corona radiata and centrum semi ovale compatible with evolving infarcts. 2. No new infarcts. 3. Stable atrophy and white matter disease likely reflects the sequela of chronic microvascular ischemia. 4. 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 5. Mild left maxillary sinus disease. Electronically Signed   By: San Morelle M.D.   On: 07/25/2022 19:51   CT ANGIO HEAD NECK W WO CM W PERF (CODE STROKE)  Result Date: 07/25/2022 CLINICAL DATA:  MRI Brain 07/09/22 EXAM: CT ANGIOGRAPHY HEAD AND NECK CT PERFUSION BRAIN TECHNIQUE: Multidetector CT imaging of the head and neck was performed using the standard protocol during bolus administration of intravenous contrast. Multiplanar CT image reconstructions and MIPs were obtained to evaluate the vascular anatomy. Carotid stenosis measurements (when applicable) are obtained utilizing NASCET criteria, using the distal internal carotid diameter as the denominator. Multiphase CT imaging of the brain was performed following IV bolus contrast injection. Subsequent parametric perfusion maps were calculated using RAPID software. RADIATION DOSE REDUCTION: This exam was performed  according to the departmental dose-optimization program which includes automated exposure control, adjustment of the mA and/or kV according to patient size and/or use of iterative reconstruction technique. CONTRAST:  118m OMNIPAQUE IOHEXOL 350 MG/ML SOLN COMPARISON:  None Available. FINDINGS: CT HEAD FINDINGS See same day CT brain no contrast-enhancing lesions visualized. Left maxillary sinus mucosal thickening, likely odontogenic1. CTA NECK FINDINGS Aortic arch: Standard branching. Imaged portion shows no evidence of aneurysm or dissection. There is mild-to-moderate narrowing of the right subclavian artery proximal to the takeoff of the subclavian artery. Right carotid system: No evidence of dissection, stenosis (50% or greater), or occlusion. Left carotid system: No evidence of dissection, stenosis (50% or greater), or occlusion. Vertebral arteries: Mild narrowing of the origin of the left vertebral artery. Mild narrowing in the proximal V2 segment of the right vertebral artery. Otherwise no evidence of hemodynamically significant stenosis. Skeleton: Degenerative changes with a small disc bulge at C3-C4, 1 C5-C6. Other neck: Negative. Upper chest: Negative. Review of the MIP images confirms the above findings CTA HEAD FINDINGS Anterior circulation: Moderate stenosis of the proximal cavernous segment of the left ICA (series 9, image 124). 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 Mild narrowing in the M1 segment of the  right MCA. Moderate to severe narrowing of the M2 segment of the right MCA (series 9, image 110/series 11, image 69). Moderate stenosis of the M2 segment of the left MCA (series 9, image 114). Posterior circulation: Moderate stenosis of the P2 segment of the right PCA (series 9, image 97). Moderate stenosis of the P2 segment of the left PCA (series 9, image 109). Venous sinuses: As permitted by contrast timing, patent. Anatomic variants: None CT Brain  Perfusion Findings: ASPECTS: 10 CBF (<30%) Volume: 64m Perfusion (Tmax>6.0s) volume: 024mMismatch Volume: 50m48mnfarction Location:Negative for infarct Review of the MIP images confirms the above findings IMPRESSION: 1. No evidence of perfusion abnormality. 2. No intracranial large vessel occlusion. Moderate to severe narrowing of the M2 segment of the bilateral MCA and PCAs, as seen previopusly. 3. Moderate stenosis of the proximal cavernous segment of the left ICA. 4. 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 5. Mild-to-moderate narrowing of the right subclavian artery proximal to the takeoff of the subclavian artery. Electronically Signed   By: HemMarin RobertsD.   On: 07/25/2022 19:06   CT HEAD CODE STROKE WO CONTRAST  Result Date: 07/25/2022 CLINICAL DATA:  Code stroke.  Stroke suspected EXAM: CT HEAD WITHOUT CONTRAST TECHNIQUE: Contiguous axial images were obtained from the base of the skull through the vertex without intravenous contrast. RADIATION DOSE REDUCTION: This exam was performed according to the departmental dose-optimization program which includes automated exposure control, adjustment of the mA and/or kV according to patient size and/or use of iterative reconstruction technique. COMPARISON:  MRI Brain 07/08/22 FINDINGS: Brain: No evidence of acute infarction, hydrocephalus, extra-axial collection or mass lesion/mass effect. Redemonstrated sequela of chronic microvascular ischemic change with likely small superimposed infarcts in the right corona radiata (series 100, image 22), left temporal lobe. No definite sites new infarcts are visualized. No evidence of hemorrhage. Vascular: No hyperdense vessel or unexpected calcification. Skull: Normal. Negative for fracture or focal lesion. Sinuses/Orbits: Right lens replacement. Other: None ASPECTS ([place] Program Early CT Score): 10 IMPRESSION: No CT evidence of new infarct.  No hemorrhage. Findings were  paged to Dr. AroRory Percy 07/25/22 at 6:48 PM via [facility]. Electronically Signed   By: HemMarin RobertsD.   On: 07/25/2022 18:49   DG Knee 1-2 Views Left  Result Date: 07/20/2022 CLINICAL DATA:  Knee pain EXAM: LEFT KNEE - 1-2 VIEW COMPARISON:  Knee radiograph 04/27/2022 FINDINGS: There is no evidence of acute fracture. There is tricompartment osteophyte formation with severe medial compartment joint space narrowing. No significant joint effusion. Vascular calcifications. IMPRESSION: No evidence of acute fracture. Tricompartment osteoarthritis, severe in the medial compartment. Electronically Signed   By: JacMaurine SimmeringD.   On: 07/20/2022 19:35   DG HIP UNILAT WITH PELVIS 2-3 VIEWS LEFT  Result Date: 07/14/2022 CLINICAL DATA:  Left hip pain. EXAM: DG HIP (WITH OR WITHOUT PELVIS) 2-3V LEFT COMPARISON:  None Available. FINDINGS: The left hip is normally located. Mild age related degenerative changes. No plain film evidence of stress fracture or AVN. Moderate vascular calcifications. IMPRESSION: Mild age related degenerative changes but no acute bony findings. Electronically Signed   By: P. Marijo SanesD.   On: 07/14/2022 12:59   VAS US [place]  Result Date: 07/11/2022 Carotid Arterial Duplex Study Patient Name:  BILSUSAN ARANAate of Exam:   07/09/2022 Medical Rec #: [id number]   Accession #:    [id number] of Birth:  03/19/1946      Patient Gender: M Patient Age:   63 years Exam Location:  [facility] Procedure:      VAS US CAROTID Referring Phys: Amie Portland --------------------------------------------------------------------------------  Indications:       CVA. Risk Factors:      Hypertension, hyperlipidemia, Diabetes, current smoker,                    coronary artery disease, PAD. Limitations        Today's exam was limited due to patient movement.                    Interference due to saline flush during exam. Comparison Study:  No prior studies. Performing Technologist:  Darlin Coco RDMS, RVT  Examination Guidelines: A complete evaluation includes B-mode imaging, spectral Doppler, color Doppler, and power Doppler as needed of all accessible portions of each vessel. Bilateral testing is considered an integral part of a complete examination. Limited examinations for reoccurring indications may be performed as noted.  Right Carotid Findings: +----------+--------+--------+--------+------------------+--------+           PSV cm/sEDV cm/sStenosisPlaque DescriptionComments +----------+--------+--------+--------+------------------+--------+ CCA Prox  98      9                                          +----------+--------+--------+--------+------------------+--------+ CCA Distal67      12                                         +----------+--------+--------+--------+------------------+--------+ ICA Prox  49      11      1-39%   heterogenous               +----------+--------+--------+--------+------------------+--------+ ICA Distal59      14                                         +----------+--------+--------+--------+------------------+--------+ ECA       99                                                 +----------+--------+--------+--------+------------------+--------+ +----------+--------+-------+----------------+-------------------+           PSV cm/sEDV cmsDescribe        Arm Pressure (mmHG) +----------+--------+-------+----------------+-------------------+ Subclavian132            Multiphasic, WNL                    +----------+--------+-------+----------------+-------------------+ +---------+--------+--+--------+-+---------+ VertebralPSV cm/s65EDV cm/s7Antegrade +---------+--------+--+--------+-+---------+  Left Carotid Findings: +----------+--------+--------+--------+------------------+------------------+           PSV cm/sEDV cm/sStenosisPlaque DescriptionComments            +----------+--------+--------+--------+------------------+------------------+ CCA Prox  111     8                                                    +----------+--------+--------+--------+------------------+------------------+ CCA Distal93  13                                intimal thickening +----------+--------+--------+--------+------------------+------------------+ ICA Prox  74      13      1-39%   heterogenous                         +----------+--------+--------+--------+------------------+------------------+ ICA Distal62      15                                                   +----------+--------+--------+--------+------------------+------------------+ ECA       182                                                          +----------+--------+--------+--------+------------------+------------------+ +----------+--------+--------+----------------+-------------------+           PSV cm/sEDV cm/sDescribe        Arm Pressure (mmHG) +----------+--------+--------+----------------+-------------------+ [id number]             Multiphasic, WNL                    +----------+--------+--------+----------------+-------------------+ +---------+--------+--+--------+-+---------+ VertebralPSV cm/s54EDV cm/s9Antegrade +---------+--------+--+--------+-+---------+   Summary: Right Carotid: Velocities in the right ICA are consistent with a 1-39% stenosis. Left Carotid: Velocities in the left ICA are consistent with a 1-39% stenosis. Vertebrals:  Bilateral vertebral arteries demonstrate antegrade flow. Subclavians: Normal flow hemodynamics were seen in bilateral subclavian              arteries. *See table(s) above for measurements and observations.  Electronically signed by Antony Contras MD on 07/11/2022 at 1:56:23 PM.    Final    ECHOCARDIOGRAM COMPLETE  Result Date: 07/09/2022    ECHOCARDIOGRAM REPORT   Patient Name:   BURT PIATEK Date of Exam: 07/09/2022 Medical  Rec #:  [id number]     Height:       71.0 in Accession #:    [id number]    Weight:       213.2 lb Date of Birth:  [birth year]     BSA:          2.167 m Patient Age:    29 years      BP:           145/73 mmHg Patient Gender: M             HR:           71 bpm. Exam Location:  Inpatient Procedure: 2D Echo, Cardiac Doppler, Color Doppler and Intracardiac            Opacification Agent Indications:     Stroke I63.9  History:         Patient has prior history of Echocardiogram examinations, most                  recent 02/26/2022. CAD, PAD; Risk Factors:Hypertension. PCA to                  the LAD on 07/07/22.  Sonographer:     Darlina Sicilian RDCS Referring Phys:  [phone]  ASHISH ARORA Diagnosing Phys: Vernell Leep MD IMPRESSIONS  1. Left ventricular ejection fraction, by estimation, is 50 to 55%. The left ventricle has low normal function. The left ventricle has no regional wall motion abnormalities. There is moderate left ventricular hypertrophy. Left ventricular diastolic parameters are consistent with Grade II diastolic dysfunction (pseudonormalization). Elevated left atrial pressure.  2. Right ventricular systolic function is normal. The right ventricular size is normal.  3. Left atrial size was moderately dilated.  4. Moderate pericardial effusion. The pericardial effusion is posterior to the left ventricle. There is no evidence of cardiac tamponade.  5. The mitral valve is normal in structure. No evidence of mitral valve regurgitation. No evidence of mitral stenosis.  6. The aortic valve is calcified. Aortic valve regurgitation is not visualized. Moderate aortic valve stenosis. Aortic valve area, by VTI measures 1.49 cm. Aortic valve mean gradient measures 18.7 mmHg. Aortic valve Vmax measures 2.82 m/s. Comparison(s): Previous study on 02/26/2022 reported LVEF 83-41%, grade 1 diastolic dysfunction. FINDINGS  Left Ventricle: Left ventricular ejection fraction, by estimation, is 50 to 55%. The left ventricle has  low normal function. The left ventricle has no regional wall motion abnormalities. Definity contrast agent was given IV to delineate the left ventricular endocardial borders. The left ventricular internal cavity size was normal in size. There is moderate left ventricular hypertrophy. Left ventricular diastolic parameters are consistent with Grade II diastolic dysfunction (pseudonormalization).  Elevated left atrial pressure. Right Ventricle: The right ventricular size is normal. No increase in right ventricular wall thickness. Right ventricular systolic function is normal. Left Atrium: Left atrial size was moderately dilated. Right Atrium: Right atrial size was normal in size. Pericardium: A moderately sized pericardial effusion is present. The pericardial effusion is posterior to the left ventricle. There is no evidence of cardiac tamponade. Mitral Valve: The mitral valve is normal in structure. No evidence of mitral valve regurgitation. No evidence of mitral valve stenosis. MV peak gradient, 4.4 mmHg. The mean mitral valve gradient is 2.0 mmHg. Tricuspid Valve: The tricuspid valve is normal in structure. Tricuspid valve regurgitation is not demonstrated. No evidence of tricuspid stenosis. Aortic Valve: The aortic valve is calcified. Aortic valve regurgitation is not visualized. Moderate aortic stenosis is present. Aortic valve mean gradient measures 18.7 mmHg. Aortic valve peak gradient measures 31.9 mmHg. Aortic valve area, by VTI measures 1.49 cm. Pulmonic Valve: The pulmonic valve was normal in structure. Pulmonic valve regurgitation is not visualized. No evidence of pulmonic stenosis. Aorta: The aortic root is normal in size and structure. IAS/Shunts: The interatrial septum was not assessed.  LEFT VENTRICLE PLAX 2D LVIDd:         5.60 cm      Diastology LVIDs:         4.00 cm      LV e' medial:    3.45 cm/s LV PW:         1.30 cm      LV E/e' medial:  31.6 LV IVS:        1.50 cm      LV e' lateral:   4.73  cm/s LVOT diam:     2.30 cm      LV E/e' lateral: 23.0 LV SV:         85 LV SV Index:   39 LVOT Area:     4.15 cm  LV Volumes (MOD) LV vol d, MOD A2C: 146.0 ml LV vol d, MOD A4C: 183.0 ml LV vol s, MOD A2C: 72.8 ml  LV vol s, MOD A4C: 80.4 ml LV SV MOD A2C:     73.2 ml LV SV MOD A4C:     183.0 ml LV SV MOD BP:      86.3 ml RIGHT VENTRICLE TAPSE (M-mode): 1.6 cm LEFT ATRIUM             Index        RIGHT ATRIUM           Index LA diam:        4.70 cm 2.17 cm/m   RA Area:     18.60 cm LA Vol (A2C):   88.3 ml 40.76 ml/m  RA Volume:   45.30 ml  20.91 ml/m LA Vol (A4C):   97.2 ml 44.86 ml/m LA Biplane Vol: 92.7 ml 42.79 ml/m  AORTIC VALVE AV Area (Vmax):    1.43 cm AV Area (Vmean):   1.34 cm AV Area (VTI):     1.49 cm AV Vmax:           282.33 cm/s AV Vmean:          199.333 cm/s AV VTI:            0.571 m AV Peak Grad:      31.9 mmHg AV Mean Grad:      18.7 mmHg LVOT Vmax:         97.30 cm/s LVOT Vmean:        64.300 cm/s LVOT VTI:          0.205 m LVOT/AV VTI ratio: 0.36  AORTA Ao Root diam: 3.30 cm Ao Asc diam:  3.30 cm MITRAL VALVE MV Area (PHT): 4.39 cm     SHUNTS MV Area VTI:   2.39 cm     Systemic VTI:  0.20 m MV Peak grad:  4.4 mmHg     Systemic Diam: 2.30 cm MV Mean grad:  2.0 mmHg MV Vmax:       1.05 m/s MV Vmean:      68.3 cm/s MV Decel Time: 173 msec MV E velocity: 109.00 cm/s MV A velocity: 94.70 cm/s MV E/A ratio:  1.15 Manish Patwardhan MD Electronically signed by Vernell Leep MD Signature Date/Time: 07/09/2022/12:42:15 PM    Final    CARDIAC CATHETERIZATION  Addendum Date: 07/09/2022   LM: Normal LAD: Prox 40%, followed by prox-mid 80% calcific stenosis         Successful percutaneous coronary intervention prox-mid LAD         Orbital atherectomy, PTCA and overlapping stents placement mid to prox LAD         3.5 X 24 mm and 3.5 X 12 mm Synergy drug-eluting stents         Post dilatation with 3.5X18 mm [place] balloon up to 22 atm Lcx: OM with moderate disease RCA: Prox 50% Mid calcific 75%  stenosis. RFR 0.96 No significant disease in abdominal aorta, renal arteries, bilateral ileofemoral arteries. Severe tortuosity noted. Will stage LE runoff and itnervention Nigel Mormon, MD Pager: [phone] Office: [phone]   Result Date: 07/09/2022 Images from the original result were not included. LM: Normal LAD: Prox 40%, followed by prox-mid 80% calcific stenosis         Successful percutaneous coronary intervention prox-mid LAD         Orbital atherectomy, PTCA and overlapping stents placement mid to prox LAD         3.5 X 24 mm and 3.5 X 12 mm Synergy drug-eluting stents  Post dilatation with 3.5X18 mm [place] balloon up to 22 atm Lcx: OM with moderate disease RCA: Prox 50% Mid calcific 75% stenosis. RFR 0.96 No significant disease in abdominal aorta, renal arteries, bilateral ileofemoral arteries. Severe tortuosity noted. Will stage LE runoff and itnervention Nigel Mormon, MD Pager: [phone] Office: [phone]   PERIPHERAL VASCULAR CATHETERIZATION  Addendum Date: 07/09/2022   LM: Normal LAD: Prox 40%, followed by prox-mid 80% calcific stenosis         Successful percutaneous coronary intervention prox-mid LAD         Orbital atherectomy, PTCA and overlapping stents placement mid to prox LAD         3.5 X 24 mm and 3.5 X 12 mm Synergy drug-eluting stents         Post dilatation with 3.5X18 mm [place] balloon up to 22 atm Lcx: OM with moderate disease RCA: Prox 50% Mid calcific 75% stenosis. RFR 0.96 No significant disease in abdominal aorta, renal arteries, bilateral ileofemoral arteries. Severe tortuosity noted. Will stage LE runoff and itnervention Nigel Mormon, MD Pager: [phone] Office: [phone]   Result Date: 07/09/2022 Images from the original result were not included. LM: Normal LAD: Prox 40%, followed by prox-mid 80% calcific stenosis         Successful percutaneous coronary intervention prox-mid LAD         Orbital atherectomy, PTCA and overlapping stents  placement mid to prox LAD         3.5 X 24 mm and 3.5 X 12 mm Synergy drug-eluting stents         Post dilatation with 3.5X18 mm [place] balloon up to 22 atm Lcx: OM with moderate disease RCA: Prox 50% Mid calcific 75% stenosis. RFR 0.96 No significant disease in abdominal aorta, renal arteries, bilateral ileofemoral arteries. Severe tortuosity noted. Will stage LE runoff and itnervention Nigel Mormon, MD Pager: [phone] Office: [phone]   MR BRAIN WO CONTRAST  Result Date: 07/09/2022 CLINICAL DATA:  Initial evaluation for neuro deficit, stroke suspected. EXAM: MRI HEAD WITHOUT CONTRAST MRA HEAD WITHOUT CONTRAST TECHNIQUE: Multiplanar, multi-echo pulse sequences of the brain and surrounding structures were acquired without intravenous contrast. Angiographic images of the Circle of Willis were acquired using MRA technique without intravenous contrast. COMPARISON:  Prior CT from 07/08/2022. FINDINGS: MRI HEAD FINDINGS Brain: Generalized age-related cerebral atrophy. Patchy T2/FLAIR hyperintensity involving the periventricular and deep white matter both cerebral hemispheres, most consistent with chronic small vessel ischemic disease, mild in nature. Multiple scattered foci of restricted diffusion are seen involving the cortical and subcortical aspects of both cerebral hemispheres, consistent with acute ischemic infarcts. The largest of these is seen at the right posterior corona radiata and measures 1.6 cm (series 5, image 91). Small infarct involving the right thalamus noted (series 5, image 84). Single small infratentorial infarct at the right cerebellum (series 5, image 70). No associated hemorrhage or mass effect. Gray-white matter differentiation otherwise maintained. No visible acute or chronic intracranial blood products. No mass lesion, midline shift or mass effect no hydrocephalus or extra-axial fluid collection. Pituitary gland and suprasellar region within normal limits. Vascular: Major  intracranial vascular flow voids are maintained. Skull and upper cervical spine: Craniocervical junction within normal limits. Bone marrow signal intensity grossly normal. No scalp soft tissue abnormality. Sinuses/Orbits: Prior ocular lens replacement on the right. Mild scattered mucosal thickening noted about the ethmoidal air cells and maxillary sinuses. No mastoid effusion. Other: None. MRA HEAD FINDINGS Anterior circulation: Examination degraded by motion. Visualized  distal cervical segments of the internal carotid arteries are patent with antegrade flow. Petrous segments patent bilaterally. Atheromatous change seen throughout the carotid siphons bilaterally. There is associated up to moderate stenoses about the left siphon (series 1042, image 4). No significant stenosis about the contralateral right carotid siphon. A1 segments patent. Normal anterior communicating artery complex. Both anterior cerebral arteries patent without high-grade stenosis. M1 segments patent bilaterally. Right M1 bifurcates early. Severe proximal M2 stenoses noted (series 1042, image 9). Additional diffuse small vessel atheromatous irregularity throughout the MCA branches. Posterior circulation: Both vertebral arteries patent without stenosis. Left vertebral artery dominant. Both PICA patent. Basilar patent to its distal aspect without stenosis. Superior cerebral arteries patent bilaterally. Both PCAs primarily supplied via the basilar. Atheromatous irregularity throughout both PCAs with associated moderate multifocal P2 stenoses, left worse than right (series 1036, image 2). PCAs remain patent to their distal aspects. Anatomic variants: None significant.  No visible aneurysm. IMPRESSION: MRI HEAD IMPRESSION: 1. Multiple scattered acute ischemic infarcts involving the cortical and subcortical aspects of both cerebral hemispheres and cerebellum. No associated hemorrhage or mass effect. A central thromboembolic etiology is likely given the  various vascular distributions involved. 2. Underlying age-related cerebral atrophy with mild chronic small vessel ischemic disease. MRA HEAD IMPRESSION: 1. Negative intracranial MRA for large vessel occlusion. 2. 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 3. Moderate multifocal stenoses about the left carotid siphon. Electronically Signed   By: Jeannine Boga M.D.   On: 07/09/2022 05:43   MR ANGIO HEAD WO CONTRAST  Result Date: 07/09/2022 CLINICAL DATA:  Initial evaluation for neuro deficit, stroke suspected. EXAM: MRI HEAD WITHOUT CONTRAST MRA HEAD WITHOUT CONTRAST TECHNIQUE: Multiplanar, multi-echo pulse sequences of the brain and surrounding structures were acquired without intravenous contrast. Angiographic images of the Circle of Willis were acquired using MRA technique without intravenous contrast. COMPARISON:  Prior CT from 07/08/2022. FINDINGS: MRI HEAD FINDINGS Brain: Generalized age-related cerebral atrophy. Patchy T2/FLAIR hyperintensity involving the periventricular and deep white matter both cerebral hemispheres, most consistent with chronic small vessel ischemic disease, mild in nature. Multiple scattered foci of restricted diffusion are seen involving the cortical and subcortical aspects of both cerebral hemispheres, consistent with acute ischemic infarcts. The largest of these is seen at the right posterior corona radiata and measures 1.6 cm (series 5, image 91). Small infarct involving the right thalamus noted (series 5, image 84). Single small infratentorial infarct at the right cerebellum (series 5, image 70). No associated hemorrhage or mass effect. Gray-white matter differentiation otherwise maintained. No visible acute or chronic intracranial blood products. No mass lesion, midline shift or mass effect no hydrocephalus or extra-axial fluid collection. Pituitary gland and suprasellar  region within normal limits. Vascular: Major intracranial vascular flow voids are maintained. Skull and upper cervical spine: Craniocervical junction within normal limits. Bone marrow signal intensity grossly normal. No scalp soft tissue abnormality. Sinuses/Orbits: Prior ocular lens replacement on the right. Mild scattered mucosal thickening noted about the ethmoidal air cells and maxillary sinuses. No mastoid effusion. Other: None. MRA HEAD FINDINGS Anterior circulation: Examination degraded by motion. Visualized distal cervical segments of the internal carotid arteries are patent with antegrade flow. Petrous segments patent bilaterally. Atheromatous change seen throughout the carotid siphons bilaterally. There is associated up to moderate stenoses about the left siphon (series 1042, image 4). No significant stenosis about the contralateral right carotid siphon. A1 segments patent. Normal anterior communicating artery complex. Both anterior  cerebral arteries patent without high-grade stenosis. M1 segments patent bilaterally. Right M1 bifurcates early. Severe proximal M2 stenoses noted (series 1042, image 9). Additional diffuse small vessel atheromatous irregularity throughout the MCA branches. Posterior circulation: Both vertebral arteries patent without stenosis. Left vertebral artery dominant. Both PICA patent. Basilar patent to its distal aspect without stenosis. Superior cerebral arteries patent bilaterally. Both PCAs primarily supplied via the basilar. Atheromatous irregularity throughout both PCAs with associated moderate multifocal P2 stenoses, left worse than right (series 1036, image 2). PCAs remain patent to their distal aspects. Anatomic variants: None significant.  No visible aneurysm. IMPRESSION: MRI HEAD IMPRESSION: 1. Multiple scattered acute ischemic infarcts involving the cortical and subcortical aspects of both cerebral hemispheres and cerebellum. No associated hemorrhage or mass effect. A  central thromboembolic etiology is likely given the various vascular distributions involved. 2. Underlying age-related cerebral atrophy with mild chronic small vessel ischemic disease. MRA HEAD IMPRESSION: 1. Negative intracranial MRA for large vessel occlusion. 2. 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 3. Moderate multifocal stenoses about the left carotid siphon. Electronically Signed   By: Jeannine Boga M.D.   On: 07/09/2022 05:43   CT HEAD WO CONTRAST (5MM)  Result Date: 07/08/2022 CLINICAL DATA:  Neurological deficit EXAM: CT HEAD WITHOUT CONTRAST TECHNIQUE: Contiguous axial images were obtained from the base of the skull through the vertex without intravenous contrast. RADIATION DOSE REDUCTION: This exam was performed according to the departmental dose-optimization program which includes automated exposure control, adjustment of the mA and/or kV according to patient size and/or use of iterative reconstruction technique. COMPARISON:  12/11/2019 FINDINGS: Brain: No acute intracranial findings are seen. There are no signs of bleeding within the cranium. Cortical sulci are prominent. There is decreased density in periventricular white matter. Vascular: Scattered arterial calcifications are seen. Skull: Unremarkable. Sinuses/Orbits: Unremarkable. Other: None. IMPRESSION: No acute intracranial findings are seen noncontrast CT brain. Atrophy. Small-vessel disease. Electronically Signed   By: Elmer Picker M.D.   On: 07/08/2022 17:10    DISCHARGE EXAMINATION: Vitals:   07/30/22 1539 07/30/22 1934 07/30/22 2311 07/31/22 0621  BP: 135/73 134/69 128/70 (!) 167/89  Pulse: 62 63 60 71  Resp: _0 Temp: 97.9 F (36.6 C) 98.2 F (36.8 C) 98.6 F (37 C) 98.2 F (36.8 C)  TempSrc: Oral Oral Oral Oral  SpO2: 98% 97% 96% 97%  Weight:      Height:       General appearance: Awake  alert.  In no distress Resp: Clear to auscultation bilaterally.  Normal effort Cardio: S1-S2 is normal regular.  No S3-S4.  No rubs murmurs or bruit GI: Abdomen is soft.  Nontender nondistended.  Bowel sounds are present normal.  No masses organomegaly  DISPOSITION: Home  Discharge Instructions     Ambulatory referral to Physical Therapy   Complete by: As directed    Call MD for:  difficulty breathing, headache or visual disturbances   Complete by: As directed    Call MD for:  extreme fatigue   Complete by: As directed    Call MD for:  persistant dizziness or light-headedness   Complete by: As directed    Call MD for:  persistant nausea and vomiting   Complete by: As directed    Call MD for:  severe uncontrolled pain   Complete by: As directed    Call MD for:  temperature >100.4   Complete by: As directed  Diet - low sodium heart healthy   Complete by: As directed    Discharge instructions   Complete by: As directed    Please follow instructions provided by the psychiatrist.  Please be sure to follow-up with your primary care provider within 1 week.  You were cared for by a hospitalist during your hospital stay. If you have any questions about your discharge medications or the care you received while you were in the hospital after you are discharged, you can call the unit and asked to speak with the hospitalist on call if the hospitalist that took care of you is not available. Once you are discharged, your primary care physician will handle any further medical issues. Please note that NO REFILLS for any discharge medications will be authorized once you are discharged, as it is imperative that you return to your primary care physician (or establish a relationship with a primary care physician if you do not have one) for your aftercare needs so that they can reassess your need for medications and monitor your lab values. If you do not have a primary care physician, you can call [phone]  for a physician referral.   Increase activity slowly   Complete by: As directed    No wound care   Complete by: As directed           Allergies as of 07/31/2022       Reactions   Wasp Venom Anaphylaxis, Itching, Swelling        Medication List     STOP taking these medications    acetaminophen 325 MG tablet Commonly known as: TYLENOL   cetirizine 10 MG tablet Commonly known as: ZYRTEC   furosemide 40 MG tablet Commonly known as: LASIX   tiZANidine 4 MG tablet Commonly known as: ZANAFLEX       TAKE these medications    amLODipine 10 MG tablet Commonly known as: NORVASC Take 10 mg by mouth in the morning. Heart Medication   aspirin 81 MG chewable tablet Chew 81 mg by mouth daily at 12 noon. Heart medication   atorvastatin 80 MG tablet Commonly known as: LIPITOR Take 1 tablet (80 mg total) by mouth at bedtime. What changed: when to take this   Calcium Carbonate-Vitamin D 600-400 MG-UNIT tablet Take 1 tablet by mouth daily.   clopidogrel 75 MG tablet Commonly known as: PLAVIX Take 1 tablet (75 mg total) by mouth daily with breakfast.   colchicine 0.6 MG tablet Take 1 tablet (0.6 mg total) by mouth 2 (two) times daily. What changed: when to take this   diclofenac Sodium 1 % Gel Commonly known as: VOLTAREN Apply 1 application topically 4 (four) times daily as needed (pain).   DULoxetine 60 MG capsule Commonly known as: CYMBALTA Take 1 capsule (60 mg total) by mouth every evening.   empagliflozin 25 MG Tabs tablet Commonly known as: JARDIANCE Take 1 tablet (25 mg total) by mouth daily before breakfast.   EPINEPHrine 0.3 mg/0.3 mL Soaj injection Commonly known as: EPI-PEN Inject 0.3 mg into the muscle daily as needed (for anaphylaxis).   ezetimibe 10 MG tablet Commonly known as: ZETIA Take 1 tablet (10 mg total) by mouth daily.   haloperidol 5 MG tablet Commonly known as: HALDOL Take 1 tablet (5 mg total) by mouth 2 (two) times daily.    isosorbide mononitrate 30 MG 24 hr tablet Commonly known as: IMDUR Take 1 tablet (30 mg total) by mouth daily.   methadone 5 MG tablet  Commonly known as: DOLOPHINE Take 5 mg by mouth every other day as needed for severe pain. For severe chronic pain   metoprolol succinate 25 MG 24 hr tablet Commonly known as: Toprol XL Take 1 tablet (25 mg total) by mouth daily. What changed: when to take this   nitroGLYCERIN 0.4 MG SL tablet Commonly known as: NITROSTAT Place 1 tablet (0.4 mg total) under the tongue every 5 (five) minutes as needed for chest pain.   oxyCODONE 5 MG immediate release tablet Commonly known as: Oxy IR/ROXICODONE Take 5 mg by mouth 4 (four) times daily as needed (severe chronic pain).   tamsulosin 0.4 MG Caps capsule Commonly known as: FLOMAX Take 1 capsule (0.4 mg total) by mouth at bedtime.   Varenicline Tartrate (Starter) 0.5 MG X 11 & 1 MG X 42 Tbpk Commonly known as: Chantix Starting Month Pak Take 1 tablet by mouth daily.   Vibegron 75 MG Tabs Take 75 mg by mouth daily.          Follow-up Information     Haydee Salter, MD. Schedule an appointment as soon as possible for a visit in 1 week(s).   Specialty: Family Medicine Why: post hospitalization follow up Contact information: [place] [address] [phone]         [facility] Follow up.   Why: Contact [facility] for next appointment Contact information: [address] [place], [place] [address]  Phone: [phone]                TOTAL DISCHARGE TIME: 35 minutes  [facility] Pager on [email or web address]  08/01/2022, 10:53 AM

## 2022-08-03 ENCOUNTER — Telehealth: Payer: Self-pay

## 2022-08-03 NOTE — Telephone Encounter (Signed)
Transition Care Management Follow-up Telephone Call Date of discharge and from where: 07/31/22 [facility]. Dx: Slurred speech How have you been since you were released from the hospital? He's doing ok Any questions or concerns? Yes Mrs. Labella would like for Dr. Gena Fray to prohibit Thomas [name] from driving. She is concerned about his coordination and does not believe he can "handle it."   Items Reviewed: Did the pt receive and understand the discharge instructions provided? Yes  Medications obtained and verified? No  Other? No  Any new allergies since your discharge? No  Dietary orders reviewed? Yes Do you have support at home? Yes   Home Care and Equipment/Supplies: Were home health services ordered? not applicable If so, what is the name of the agency? N/a  Has the agency set up a time to come to the patient's home? not applicable Were any new equipment or medical supplies ordered?  No What is the name of the medical supply agency? N/a Were you able to get the supplies/equipment? not applicable Do you have any questions related to the use of the equipment or supplies? No  Functional Questionnaire: (I = Independent and D = Dependent) ADLs: I/D  Bathing/Dressing- D  Meal Prep- D  Eating- I  Maintaining continence- I  Transferring/Ambulation- I  Managing Meds- D  Follow up appointments reviewed:  PCP Hospital f/u appt confirmed? Yes  Scheduled to see Mr. Gena Fray on 08/14/22 @ [facility] f/u appt confirmed? No  Scheduled to see n/a on n/a @ n/a. Are transportation arrangements needed? No  If their condition worsens, is the pt aware to call PCP or go to the Emergency Dept.? Yes Was the patient provided with contact information for the PCP's office or ED? Yes Was to pt encouraged to call back with questions or concerns? Yes  Angeline Slim, RN, BSN RN Clinical Supervisor [facility] [employer]

## 2022-08-04 ENCOUNTER — Encounter: Payer: Medicare Other | Attending: Registered Nurse | Admitting: Registered Nurse

## 2022-08-04 ENCOUNTER — Encounter: Payer: Self-pay | Admitting: Registered Nurse

## 2022-08-04 VITALS — BP 151/95 | HR 74 | Ht 71.0 in | Wt 194.0 lb

## 2022-08-04 DIAGNOSIS — I200 Unstable angina: Secondary | ICD-10-CM

## 2022-08-04 DIAGNOSIS — I639 Cerebral infarction, unspecified: Secondary | ICD-10-CM | POA: Insufficient documentation

## 2022-08-04 DIAGNOSIS — E7849 Other hyperlipidemia: Secondary | ICD-10-CM | POA: Diagnosis not present

## 2022-08-04 DIAGNOSIS — I10 Essential (primary) hypertension: Secondary | ICD-10-CM | POA: Diagnosis not present

## 2022-08-04 NOTE — Progress Notes (Signed)
Subjective:    Patient ID: Thomas [name], male    DOB: June 26, 1946, 76 y.o.   MRN: [id number]  HPI: Thomas [name] is a 76 y.o. male who returns for HFU appointment of his Acute Ischemic [place]oke, Essential Hypertension and Hyperlipidemia.  Dr Virgina Jock  Discharge summary note from 07/10/2022.  CT Head WO Contrast:  IMPRESSION: No acute intracranial findings are seen noncontrast CT brain. Atrophy. Small-vessel disease.  MR Brain: MRA  IMPRESSION: MRI HEAD IMPRESSION:   1. Multiple scattered acute ischemic infarcts involving the cortical and subcortical aspects of both cerebral hemispheres and cerebellum. No associated hemorrhage or mass effect. A central thromboembolic etiology is likely given the various vascular distributions involved. 2. Underlying age-related cerebral atrophy with mild chronic small vessel ischemic disease.   MRA HEAD IMPRESSION:   1. Negative intracranial MRA for large vessel occlusion. 2. 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 3. Moderate multifocal stenoses about the left carotid siphon.  Neurology consulted: Recommended 3- day cardia Monitoring, continue Plavix and ASA.  He was admitted to Inpatient Rehabilitation on 07/10/2022 and discharged on 07/22/2022. His Therapy with [facility] will be on Thursday.  He states he has pain in his lower back and generalized joint pain. He rates his pain 8. Also reports he has a good appetite.  He also states he has an appointment with [facility].   He was re-admitted to [facility] on 07/25/2022 and discharged on 11/24/203 , with slurred speech and mental status worsening. Discharge summary was reviewed.  Dr Gena Fray: H&P Chief Complaint:  Acute metabolic encephalopathy   HPI: This is a 76 year old male with past medical history CHF, dyslipidemia, hypertension, chronic severe lower back pain.  Patient was  recently admitted 03/27/2022 to 07/10/2022, then rehab 07/10/2022 to 07/22/2022.  During that hospitalization had a atherectomy and PCI to prox-mid LAD.  He also suffered a stroke with left-sided weakness.  He was discharged to rehab.  He has been home for 3 days doing well.  He was okay this morning when he awoke, by noon he was somewhat agitated.  He was focused on his wife going out to buy bread and eggs despite the fact that there was some the house.  Around 2 PM she went to the store, when she came back the patient appeared okay, but when his granddaughter called it became apparent that the patient was altered and confused.  He did not know what the telephone was, he did not know who his granddaughter Juliann Pulse was.  His best friend called and he did not recognize ringing of his own phone or who his best friend was.  EMS was called, he was brought to the ER.   Patient started a new medication Wellbutrin today  Wife in room.   Pain Inventory Average Pain 8 Pain Right Now 8 My pain is constant, stabbing, and aching  LOCATION OF PAIN  back, knees, legs  BOWEL Number of stools per week: 7   BLADDER Normal  Bladder incontinence Yes     Mobility use a walker how many minutes can you walk? 30 seconds ability to climb steps?  yes do you drive?  no Do you have any goals in this area?  yes  Function retired I need assistance with the following:  dressing and bathing Do you have any goals in this area?  yes  Neuro/Psych bowel control problems trouble walking confusion depression  Prior Studies  Any changes since last visit?  no  Physicians involved in your care Any changes since last visit?  no   Family History  Problem Relation Age of Onset   Hypertension Mother    Hypertension Father    Alzheimer's disease Father    Heart disease Sister    Heart disease Paternal Aunt    Social History   Socioeconomic History   Marital status: Married    Spouse name: [name]   Number of  children: 2   Years of education: Not on file   Highest education level: Not on file  Occupational History   Occupation: retired Nature conservation officer  Tobacco Use   Smoking status: Every Day    Packs/day: 0.50    Years: 55.00    Total pack years: 27.50    Types: Cigarettes    Passive exposure: Current   Smokeless tobacco: Never   Tobacco comments:    Has cut back  Vaping Use   Vaping Use: Never used  Substance and Sexual Activity   Alcohol use: No   Drug use: No    Comment: takes prescriptions as prescribed: oxycodone and methadone   Sexual activity: Never    Birth control/protection: None  Other Topics Concern   Not on file  Social History Narrative   Lives at home with wife   Right handed   Caffeine: 2-4 cups/day   Social Determinants of Health   Financial Resource Strain: Low Risk  (11/26/2021)   Overall Financial Resource Strain (CARDIA)    Difficulty of Paying Living Expenses: Not hard at all  Food Insecurity: No Food Insecurity (07/07/2022)   Hunger Vital Sign    Worried About Running Out of Food in the Last Year: Never true    Ran Out of Food in the Last Year: Never true  Transportation Needs: No Transportation Needs (07/07/2022)   PRAPARE - Hydrologist (Medical): No    Lack of Transportation (Non-Medical): No  Physical Activity: Insufficiently Active (11/26/2021)   Exercise Vital Sign    Days of Exercise per Week: 3 days    Minutes of Exercise per Session: 30 min  Stress: No Stress Concern Present (11/26/2021)   [place]    Feeling of Stress : Not at all  Social Connections: [place] (11/26/2021)   Social Connection and Isolation Panel [NHANES]    Frequency of Communication with Friends and Family: Three times a week    Frequency of Social Gatherings with Friends and Family: Three times a week    Attends Religious Services: More than 4 times per year    Active  Member of Clubs or Organizations: Yes    Attends Archivist Meetings: 1 to 4 times per year    Marital Status: Married   Past Surgical History:  Procedure Laterality Date   ABDOMINAL AORTOGRAM W/LOWER EXTREMITY N/A 07/07/2022   Procedure: ABDOMINAL AORTOGRAM W/LOWER EXTREMITY;  Surgeon: Nigel Mormon, MD;  Location: [facility];  Service: Cardiovascular;  Laterality: N/A;   CORONARY ATHERECTOMY N/A 07/07/2022   Procedure: CORONARY ATHERECTOMY;  Surgeon: Nigel Mormon, MD;  Location: [place] [facility];  Service: Cardiovascular;  Laterality: N/A;   CORONARY STENT INTERVENTION N/A 07/07/2022   Procedure: CORONARY STENT INTERVENTION;  Surgeon: Nigel Mormon, MD;  Location: [place] [facility];  Service: Cardiovascular;  Laterality: N/A;   HAND SURGERY Right    INTRAVASCULAR PRESSURE WIRE/FFR STUDY N/A 07/07/2022  Procedure: INTRAVASCULAR PRESSURE WIRE/FFR STUDY;  Surgeon: Nigel Mormon, MD;  Location: [place] [facility];  Service: Cardiovascular;  Laterality: N/A;   LEFT HEART CATH AND CORONARY ANGIOGRAPHY N/A 07/07/2022   Procedure: LEFT HEART CATH AND CORONARY ANGIOGRAPHY;  Surgeon: Nigel Mormon, MD;  Location: [facility];  Service: Cardiovascular;  Laterality: N/A;   ORIF ANKLE FRACTURE Left 08/15/2007   Distal fibular   REPLACEMENT TOTAL KNEE Right 07/12/2012   RIGHT/LEFT HEART CATH AND CORONARY ANGIOGRAPHY N/A 01/12/2018   Procedure: RIGHT/LEFT HEART CATH AND CORONARY ANGIOGRAPHY;  Surgeon: Belva Crome, MD;  Location: [place] [facility];  Service: Cardiovascular;  Laterality: N/A;   RIGHT/LEFT HEART CATH AND CORONARY ANGIOGRAPHY N/A 05/19/2022   Procedure: RIGHT/LEFT HEART CATH AND CORONARY ANGIOGRAPHY;  Surgeon: Nigel Mormon, MD;  Location: [place] [facility];  Service: Cardiovascular;  Laterality: N/A;   SHOULDER SURGERY Right    UMBILICAL HERNIA REPAIR  06/23/2013   Past Medical History:  Diagnosis Date   Actinic  keratosis 05/12/2017   Back pain    Chronic systolic (congestive) heart failure ([place]) 05/06/2022   Depression 05/12/2017   Diabetes ([place])    GERD (gastroesophageal reflux disease) 05/12/2017   Hx of colonic polyps 05/12/2017   Hyperlipidemia 05/12/2017   Hypertension    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/2017   Low back pain 05/12/2017   Lumbar radiculopathy 05/12/2017   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  Opioid Risk Score:   Fall Risk Score:  `1  Depression screen PHQ 2/9     11/26/2021    4:07 PM 11/26/2021    4:05 PM 10/22/2021    1:01 PM 09/26/2021    1:06 PM 01/01/2020    8:43 PM  Depression screen PHQ 2/9  Decreased Interest 0 0 0 0 0  Down, Depressed, Hopeless 0 0 0 0 1  PHQ - 2 Score 0 0 0 0 1    Review of Systems  Respiratory:  Positive for shortness of breath and wheezing.   Cardiovascular:  Positive for leg swelling.  Genitourinary:        Bladder control  Musculoskeletal:  Positive for gait problem.  Psychiatric/Behavioral:  Positive for confusion.        Depression  All other systems reviewed and are negative.      Objective:   Physical Exam Vitals and nursing note reviewed.  Constitutional:      Appearance: Normal appearance.  Cardiovascular:     Rate and Rhythm: Normal rate and regular rhythm.     Pulses: Normal pulses.     Heart sounds: Normal heart sounds.  Pulmonary:     Effort: Pulmonary effort is normal.     Breath sounds: Normal breath sounds.  Musculoskeletal:     Cervical back: Normal range of motion and neck supple.     Comments: Normal Muscle Bulk and Muscle Testing Reveals:  Upper Extremities: Right: Full ROM and Muscle Strength 5/5 Left Upper Extremity: Decreased ROM 45 Degrees and Muscle Strength 4/5   Lumbar Paraspinal Tenderness: L-4-L-5 Lower Extremities: Right: Full ROM and Muscle Strength 5/5 Left Lower Extremity: Decreased ROM and Muscle Strength 5/5 Left Lower Extremity Flexion Produces Pain into his Left Patella Arises from Table slowly using  walker for support Narrow Based  Gait     Skin:    General: Skin is warm and dry.  Neurological:     Mental Status: He is alert and oriented to person, place, and time.  Psychiatric:        Mood and Affect: Mood normal.        Behavior: Behavior normal.         Assessment & Plan:  Acute Ischemic Stoke: He will begin therapy with [facility] on Thursday. He has a scheduled appointment with [facility] 2.  Essential Hypertension: Continue current medication regimen. PCP following. Continue to Monitor.  3.  Hyperlipidemia: Continue current medication regimen. PCP following.   F/U with Dr Letta Pate in 4- 6 weeks

## 2022-08-05 ENCOUNTER — Telehealth: Payer: Self-pay | Admitting: Physical Medicine and Rehabilitation

## 2022-08-05 NOTE — Telephone Encounter (Signed)
Pt's wife called requesting an appt for back injections. Phone number is [phone]

## 2022-08-06 DIAGNOSIS — R278 Other lack of coordination: Secondary | ICD-10-CM | POA: Diagnosis not present

## 2022-08-06 DIAGNOSIS — Z7409 Other reduced mobility: Secondary | ICD-10-CM | POA: Diagnosis not present

## 2022-08-06 DIAGNOSIS — I639 Cerebral infarction, unspecified: Secondary | ICD-10-CM | POA: Diagnosis not present

## 2022-08-07 NOTE — Telephone Encounter (Signed)
Spoke with patient's wife and scheduled for 08/19/22

## 2022-08-09 ENCOUNTER — Encounter: Payer: Self-pay | Admitting: Registered Nurse

## 2022-08-10 ENCOUNTER — Ambulatory Visit: Payer: Medicare Other | Admitting: Orthopedic Surgery

## 2022-08-10 ENCOUNTER — Ambulatory Visit: Payer: Medicare Other | Admitting: Cardiology

## 2022-08-12 DIAGNOSIS — I639 Cerebral infarction, unspecified: Secondary | ICD-10-CM | POA: Diagnosis not present

## 2022-08-12 DIAGNOSIS — Z7409 Other reduced mobility: Secondary | ICD-10-CM | POA: Diagnosis not present

## 2022-08-12 DIAGNOSIS — R278 Other lack of coordination: Secondary | ICD-10-CM | POA: Diagnosis not present

## 2022-08-14 ENCOUNTER — Inpatient Hospital Stay: Payer: Medicare Other | Admitting: Family Medicine

## 2022-08-17 ENCOUNTER — Ambulatory Visit: Payer: Self-pay | Admitting: Internal Medicine

## 2022-08-18 ENCOUNTER — Telehealth: Payer: Self-pay | Admitting: Physical Medicine and Rehabilitation

## 2022-08-18 NOTE — Telephone Encounter (Signed)
Spoke with patient's wife and she stated the patient is in the [place] hospital and she does not know how long he will be there. Informed her to give me a call when he returns home and is ready to have the injection

## 2022-08-18 NOTE — Telephone Encounter (Signed)
Patient's wife Loletha Carrow called advised patient is in the [place] hospital in [place] and can not come to his appointment.  The number to contact [name] if needed is [phone]

## 2022-08-19 ENCOUNTER — Encounter: Payer: Medicare Other | Admitting: Physical Medicine and Rehabilitation

## 2022-08-25 ENCOUNTER — Other Ambulatory Visit: Payer: Self-pay | Admitting: Physician Assistant

## 2022-09-02 ENCOUNTER — Other Ambulatory Visit: Payer: Self-pay | Admitting: Physician Assistant

## 2022-09-03 ENCOUNTER — Inpatient Hospital Stay: Payer: Medicare Other | Admitting: Adult Health

## 2022-09-10 ENCOUNTER — Encounter: Payer: Medicare Other | Admitting: Physical Medicine & Rehabilitation

## 2022-09-28 NOTE — Progress Notes (Signed)
cancelled

## 2022-10-14 DIAGNOSIS — F02C2 Dementia in other diseases classified elsewhere, severe, with psychotic disturbance: Secondary | ICD-10-CM | POA: Diagnosis not present

## 2022-10-14 DIAGNOSIS — F32A Depression, unspecified: Secondary | ICD-10-CM | POA: Diagnosis not present

## 2022-10-14 DIAGNOSIS — G4709 Other insomnia: Secondary | ICD-10-CM | POA: Diagnosis not present

## 2022-11-03 DIAGNOSIS — E1140 Type 2 diabetes mellitus with diabetic neuropathy, unspecified: Secondary | ICD-10-CM | POA: Diagnosis not present

## 2022-11-06 DEATH — deceased

## 2022-12-07 DEATH — deceased

## 2023-12-22 ENCOUNTER — Other Ambulatory Visit: Payer: Self-pay | Admitting: Physical Medicine & Rehabilitation
